# Patient Record
Sex: Male | Born: 1935 | Race: White | Hispanic: No | Smoking: Former smoker
Health system: Southern US, Community
[De-identification: ages and names within clinical notes are randomized; demographics above are authoritative.]

## PROBLEM LIST (undated history)

## (undated) ENCOUNTER — Emergency Department (HOSPITAL_COMMUNITY): Discharge status: Against Medical Advice | Payer: Medicare Other

## (undated) DIAGNOSIS — E785 Hyperlipidemia, unspecified: Secondary | ICD-10-CM

## (undated) DIAGNOSIS — C61 Malignant neoplasm of prostate: Secondary | ICD-10-CM

## (undated) DIAGNOSIS — N183 Chronic kidney disease, stage 3 unspecified: Secondary | ICD-10-CM

## (undated) DIAGNOSIS — G4733 Obstructive sleep apnea (adult) (pediatric): Secondary | ICD-10-CM

## (undated) DIAGNOSIS — N529 Male erectile dysfunction, unspecified: Secondary | ICD-10-CM

## (undated) DIAGNOSIS — Z9989 Dependence on other enabling machines and devices: Secondary | ICD-10-CM

## (undated) DIAGNOSIS — Z9889 Other specified postprocedural states: Secondary | ICD-10-CM

## (undated) DIAGNOSIS — F329 Major depressive disorder, single episode, unspecified: Secondary | ICD-10-CM

## (undated) DIAGNOSIS — K5909 Other constipation: Secondary | ICD-10-CM

## (undated) DIAGNOSIS — Z87442 Personal history of urinary calculi: Secondary | ICD-10-CM

## (undated) DIAGNOSIS — I951 Orthostatic hypotension: Secondary | ICD-10-CM

## (undated) DIAGNOSIS — I639 Cerebral infarction, unspecified: Secondary | ICD-10-CM

## (undated) DIAGNOSIS — I82409 Acute embolism and thrombosis of unspecified deep veins of unspecified lower extremity: Secondary | ICD-10-CM

## (undated) DIAGNOSIS — E039 Hypothyroidism, unspecified: Secondary | ICD-10-CM

## (undated) DIAGNOSIS — F03A0 Unspecified dementia, mild, without behavioral disturbance, psychotic disturbance, mood disturbance, and anxiety: Secondary | ICD-10-CM

## (undated) DIAGNOSIS — N400 Enlarged prostate without lower urinary tract symptoms: Secondary | ICD-10-CM

## (undated) DIAGNOSIS — D649 Anemia, unspecified: Secondary | ICD-10-CM

## (undated) DIAGNOSIS — Z8551 Personal history of malignant neoplasm of bladder: Secondary | ICD-10-CM

## (undated) DIAGNOSIS — E119 Type 2 diabetes mellitus without complications: Secondary | ICD-10-CM

## (undated) DIAGNOSIS — D494 Neoplasm of unspecified behavior of bladder: Secondary | ICD-10-CM

## (undated) DIAGNOSIS — R319 Hematuria, unspecified: Secondary | ICD-10-CM

## (undated) DIAGNOSIS — I2510 Atherosclerotic heart disease of native coronary artery without angina pectoris: Secondary | ICD-10-CM

## (undated) DIAGNOSIS — IMO0001 Reserved for inherently not codable concepts without codable children: Secondary | ICD-10-CM

## (undated) DIAGNOSIS — R195 Other fecal abnormalities: Secondary | ICD-10-CM

## (undated) DIAGNOSIS — F32A Depression, unspecified: Secondary | ICD-10-CM

## (undated) DIAGNOSIS — Z951 Presence of aortocoronary bypass graft: Secondary | ICD-10-CM

## (undated) DIAGNOSIS — K219 Gastro-esophageal reflux disease without esophagitis: Secondary | ICD-10-CM

## (undated) DIAGNOSIS — F419 Anxiety disorder, unspecified: Secondary | ICD-10-CM

## (undated) DIAGNOSIS — I219 Acute myocardial infarction, unspecified: Secondary | ICD-10-CM

## (undated) DIAGNOSIS — D49519 Neoplasm of unspecified behavior of unspecified kidney: Secondary | ICD-10-CM

## (undated) DIAGNOSIS — F0390 Unspecified dementia without behavioral disturbance: Secondary | ICD-10-CM

## (undated) DIAGNOSIS — Z8719 Personal history of other diseases of the digestive system: Secondary | ICD-10-CM

## (undated) DIAGNOSIS — I5042 Chronic combined systolic (congestive) and diastolic (congestive) heart failure: Secondary | ICD-10-CM

## (undated) HISTORY — PX: UPPER GASTROINTESTINAL ENDOSCOPY: SHX188

## (undated) HISTORY — DX: Hematuria, unspecified: R31.9

## (undated) HISTORY — PX: CARDIAC CATHETERIZATION: SHX172

## (undated) HISTORY — DX: Gastro-esophageal reflux disease without esophagitis: K21.9

## (undated) HISTORY — DX: Chronic combined systolic (congestive) and diastolic (congestive) heart failure: I50.42

## (undated) HISTORY — PX: EYE SURGERY: SHX253

## (undated) HISTORY — PX: OTHER SURGICAL HISTORY: SHX169

## (undated) HISTORY — DX: Neoplasm of unspecified behavior of bladder: D49.4

## (undated) HISTORY — PX: HAND SURGERY: SHX662

## (undated) HISTORY — PX: WRIST FRACTURE SURGERY: SHX121

## (undated) HISTORY — DX: Orthostatic hypotension: I95.1

## (undated) HISTORY — DX: Other constipation: K59.09

## (undated) HISTORY — DX: Hyperlipidemia, unspecified: E78.5

## (undated) HISTORY — PX: NEPHRECTOMY: SHX65

## (undated) HISTORY — PX: CARDIOVASCULAR STRESS TEST: SHX262

## (undated) HISTORY — DX: Anemia, unspecified: D64.9

## (undated) HISTORY — PX: HEMORRHOID SURGERY: SHX153

## (undated) HISTORY — DX: Other fecal abnormalities: R19.5

## (undated) HISTORY — DX: Obstructive sleep apnea (adult) (pediatric): G47.33

## (undated) HISTORY — DX: Dependence on other enabling machines and devices: Z99.89

## (undated) HISTORY — PX: BYPASS GRAFT: SHX909

---

## 2000-08-02 ENCOUNTER — Ambulatory Visit (HOSPITAL_COMMUNITY): Admission: RE | Admit: 2000-08-02 | Discharge: 2000-08-02 | Payer: Self-pay | Admitting: *Deleted

## 2000-08-02 ENCOUNTER — Encounter: Payer: Self-pay | Admitting: *Deleted

## 2000-12-25 ENCOUNTER — Ambulatory Visit (HOSPITAL_COMMUNITY): Admission: RE | Admit: 2000-12-25 | Discharge: 2000-12-25 | Payer: Self-pay | Admitting: Internal Medicine

## 2001-09-26 ENCOUNTER — Ambulatory Visit (HOSPITAL_COMMUNITY): Admission: RE | Admit: 2001-09-26 | Discharge: 2001-09-26 | Payer: Self-pay | Admitting: General Surgery

## 2002-07-28 ENCOUNTER — Emergency Department (HOSPITAL_COMMUNITY): Admission: EM | Admit: 2002-07-28 | Discharge: 2002-07-28 | Payer: Self-pay | Admitting: Emergency Medicine

## 2003-02-24 ENCOUNTER — Ambulatory Visit (HOSPITAL_COMMUNITY): Admission: RE | Admit: 2003-02-24 | Discharge: 2003-02-24 | Payer: Self-pay | Admitting: Family Medicine

## 2003-02-24 ENCOUNTER — Ambulatory Visit (HOSPITAL_COMMUNITY): Admission: RE | Admit: 2003-02-24 | Discharge: 2003-02-24 | Payer: Self-pay | Admitting: Internal Medicine

## 2003-04-24 ENCOUNTER — Ambulatory Visit (HOSPITAL_COMMUNITY): Admission: RE | Admit: 2003-04-24 | Discharge: 2003-04-24 | Payer: Self-pay | Admitting: General Surgery

## 2003-04-24 HISTORY — PX: UMBILICAL HERNIA REPAIR: SHX196

## 2003-10-29 ENCOUNTER — Ambulatory Visit (HOSPITAL_COMMUNITY): Admission: RE | Admit: 2003-10-29 | Discharge: 2003-10-29 | Payer: Self-pay | Admitting: Family Medicine

## 2003-11-10 ENCOUNTER — Ambulatory Visit (HOSPITAL_COMMUNITY): Admission: RE | Admit: 2003-11-10 | Discharge: 2003-11-10 | Payer: Self-pay | Admitting: Family Medicine

## 2004-05-18 ENCOUNTER — Ambulatory Visit (HOSPITAL_COMMUNITY): Admission: RE | Admit: 2004-05-18 | Discharge: 2004-05-18 | Payer: Self-pay | Admitting: Urology

## 2004-06-14 ENCOUNTER — Observation Stay (HOSPITAL_COMMUNITY): Admission: RE | Admit: 2004-06-14 | Discharge: 2004-06-15 | Payer: Self-pay | Admitting: Urology

## 2004-06-14 HISTORY — PX: TRANSURETHRAL RESECTION OF BLADDER TUMOR: SHX2575

## 2004-06-17 ENCOUNTER — Emergency Department (HOSPITAL_COMMUNITY): Admission: EM | Admit: 2004-06-17 | Discharge: 2004-06-17 | Payer: Self-pay | Admitting: *Deleted

## 2004-08-29 ENCOUNTER — Ambulatory Visit (HOSPITAL_COMMUNITY): Admission: RE | Admit: 2004-08-29 | Discharge: 2004-08-29 | Payer: Self-pay | Admitting: Family Medicine

## 2004-10-04 ENCOUNTER — Inpatient Hospital Stay (HOSPITAL_COMMUNITY): Admission: RE | Admit: 2004-10-04 | Discharge: 2004-10-07 | Payer: Self-pay | Admitting: Urology

## 2004-10-04 HISTORY — PX: TRANSURETHRAL RESECTION OF PROSTATE: SHX73

## 2005-12-16 ENCOUNTER — Emergency Department (HOSPITAL_COMMUNITY): Admission: EM | Admit: 2005-12-16 | Discharge: 2005-12-16 | Payer: Self-pay | Admitting: Emergency Medicine

## 2006-06-25 ENCOUNTER — Emergency Department (HOSPITAL_COMMUNITY): Admission: EM | Admit: 2006-06-25 | Discharge: 2006-06-25 | Payer: Self-pay | Admitting: Emergency Medicine

## 2006-07-26 ENCOUNTER — Ambulatory Visit (HOSPITAL_COMMUNITY): Admission: RE | Admit: 2006-07-26 | Discharge: 2006-07-26 | Payer: Self-pay | Admitting: Family Medicine

## 2006-08-13 ENCOUNTER — Ambulatory Visit: Payer: Self-pay | Admitting: Internal Medicine

## 2006-08-15 ENCOUNTER — Ambulatory Visit: Payer: Self-pay | Admitting: Internal Medicine

## 2006-08-15 ENCOUNTER — Ambulatory Visit (HOSPITAL_COMMUNITY): Admission: RE | Admit: 2006-08-15 | Discharge: 2006-08-15 | Payer: Self-pay | Admitting: Internal Medicine

## 2006-08-15 HISTORY — PX: COLONOSCOPY WITH ESOPHAGOGASTRODUODENOSCOPY (EGD): SHX5779

## 2006-10-16 ENCOUNTER — Encounter (INDEPENDENT_AMBULATORY_CARE_PROVIDER_SITE_OTHER): Payer: Self-pay | Admitting: Urology

## 2006-10-16 ENCOUNTER — Ambulatory Visit (HOSPITAL_COMMUNITY): Admission: RE | Admit: 2006-10-16 | Discharge: 2006-10-16 | Payer: Self-pay | Admitting: Urology

## 2006-10-16 HISTORY — PX: OTHER SURGICAL HISTORY: SHX169

## 2006-10-30 ENCOUNTER — Encounter (HOSPITAL_COMMUNITY): Admission: RE | Admit: 2006-10-30 | Discharge: 2006-11-29 | Payer: Self-pay | Admitting: Neurology

## 2006-11-30 ENCOUNTER — Encounter (HOSPITAL_COMMUNITY): Admission: RE | Admit: 2006-11-30 | Discharge: 2006-12-30 | Payer: Self-pay | Admitting: Neurology

## 2007-02-26 ENCOUNTER — Ambulatory Visit (HOSPITAL_COMMUNITY): Admission: RE | Admit: 2007-02-26 | Discharge: 2007-02-26 | Payer: Self-pay | Admitting: Family Medicine

## 2007-07-04 ENCOUNTER — Encounter (HOSPITAL_COMMUNITY): Admission: RE | Admit: 2007-07-04 | Discharge: 2007-08-03 | Payer: Self-pay | Admitting: Internal Medicine

## 2007-07-17 ENCOUNTER — Ambulatory Visit (HOSPITAL_COMMUNITY): Admission: RE | Admit: 2007-07-17 | Discharge: 2007-07-17 | Payer: Self-pay | Admitting: Urology

## 2007-08-03 ENCOUNTER — Emergency Department (HOSPITAL_COMMUNITY): Admission: EM | Admit: 2007-08-03 | Discharge: 2007-08-03 | Payer: Self-pay | Admitting: Emergency Medicine

## 2007-08-21 ENCOUNTER — Encounter (HOSPITAL_COMMUNITY): Admission: RE | Admit: 2007-08-21 | Discharge: 2007-09-20 | Payer: Self-pay | Admitting: Internal Medicine

## 2008-05-14 ENCOUNTER — Emergency Department (HOSPITAL_COMMUNITY): Admission: EM | Admit: 2008-05-14 | Discharge: 2008-05-14 | Payer: Self-pay | Admitting: Emergency Medicine

## 2009-07-11 ENCOUNTER — Ambulatory Visit: Admission: RE | Admit: 2009-07-11 | Discharge: 2009-07-11 | Payer: Self-pay | Admitting: Neurology

## 2009-08-05 ENCOUNTER — Ambulatory Visit (HOSPITAL_COMMUNITY): Admission: RE | Admit: 2009-08-05 | Discharge: 2009-08-05 | Payer: Self-pay | Admitting: Internal Medicine

## 2009-09-20 ENCOUNTER — Emergency Department (HOSPITAL_COMMUNITY): Admission: EM | Admit: 2009-09-20 | Discharge: 2009-09-20 | Payer: Self-pay | Admitting: Emergency Medicine

## 2009-10-18 ENCOUNTER — Ambulatory Visit (HOSPITAL_COMMUNITY): Admission: RE | Admit: 2009-10-18 | Discharge: 2009-10-18 | Payer: Self-pay | Admitting: Family Medicine

## 2010-01-25 ENCOUNTER — Ambulatory Visit: Payer: Self-pay | Admitting: Internal Medicine

## 2010-02-28 ENCOUNTER — Ambulatory Visit: Payer: Self-pay | Admitting: Internal Medicine

## 2010-03-01 LAB — HEPATIC FUNCTION PANEL
ALT: 10 U/L (ref 10–40)
AST: 15 U/L (ref 14–40)
Bilirubin, Total: 0.5 mg/dL

## 2010-03-01 LAB — BASIC METABOLIC PANEL: Glucose: 108 mg/dL

## 2010-03-01 LAB — CBC AND DIFFERENTIAL: Hemoglobin: 14.5 g/dL (ref 13.5–17.5)

## 2010-03-04 ENCOUNTER — Ambulatory Visit (HOSPITAL_COMMUNITY)
Admission: RE | Admit: 2010-03-04 | Discharge: 2010-03-04 | Payer: Self-pay | Source: Home / Self Care | Admitting: Internal Medicine

## 2010-04-06 ENCOUNTER — Ambulatory Visit: Payer: Self-pay | Admitting: Internal Medicine

## 2010-04-06 ENCOUNTER — Ambulatory Visit (HOSPITAL_COMMUNITY)
Admission: RE | Admit: 2010-04-06 | Discharge: 2010-04-06 | Payer: Self-pay | Source: Home / Self Care | Attending: Internal Medicine | Admitting: Internal Medicine

## 2010-05-01 ENCOUNTER — Encounter: Payer: Self-pay | Admitting: Family Medicine

## 2010-06-27 LAB — RAPID URINE DRUG SCREEN, HOSP PERFORMED
Barbiturates: NOT DETECTED
Cocaine: NOT DETECTED
Opiates: NOT DETECTED
Tetrahydrocannabinol: NOT DETECTED

## 2010-06-27 LAB — COMPREHENSIVE METABOLIC PANEL
Alkaline Phosphatase: 59 U/L (ref 39–117)
BUN: 15 mg/dL (ref 6–23)
CO2: 27 mEq/L (ref 19–32)
GFR calc non Af Amer: 60 mL/min — ABNORMAL LOW (ref >60)
Glucose, Bld: 108 mg/dL — ABNORMAL HIGH (ref 70–99)
Potassium: 4.2 mEq/L (ref 3.5–5.1)
Sodium: 137 mEq/L (ref 135–145)

## 2010-06-27 LAB — URINALYSIS, ROUTINE W REFLEX MICROSCOPIC
Bilirubin Urine: NEGATIVE
Ketones, ur: NEGATIVE mg/dL
Nitrite: NEGATIVE
pH: 5 (ref 5.0–8.0)

## 2010-06-27 LAB — DIFFERENTIAL
Basophils Relative: 1 % (ref 0–1)
Lymphocytes Relative: 24 % (ref 12–46)
Lymphs Abs: 1.7 10*3/uL (ref 0.7–4.0)
Monocytes Relative: 10 % (ref 3–12)
Neutro Abs: 4.3 10*3/uL (ref 1.7–7.7)

## 2010-06-27 LAB — CBC
HCT: 39.6 % (ref 39.0–52.0)
Hemoglobin: 13.5 g/dL (ref 13.0–17.0)
MCV: 90.5 fL (ref 78.0–100.0)
Platelets: 185 10*3/uL (ref 150–400)
RBC: 4.37 MIL/uL (ref 4.22–5.81)
RDW: 13.5 % (ref 11.5–15.5)

## 2010-06-27 LAB — URINE MICROSCOPIC-ADD ON

## 2010-06-27 LAB — URINE CULTURE: Colony Count: NO GROWTH

## 2010-07-26 LAB — URINALYSIS, ROUTINE W REFLEX MICROSCOPIC
Bilirubin Urine: NEGATIVE
Hgb urine dipstick: NEGATIVE
Ketones, ur: NEGATIVE mg/dL
Nitrite: NEGATIVE
Specific Gravity, Urine: 1.015 (ref 1.005–1.030)
pH: 6 (ref 5.0–8.0)

## 2010-07-26 LAB — CBC
HCT: 39.2 % (ref 39.0–52.0)
Hemoglobin: 13.3 g/dL (ref 13.0–17.0)
MCHC: 34 g/dL (ref 30.0–36.0)
MCV: 90.5 fL (ref 78.0–100.0)
Platelets: 194 10*3/uL (ref 150–400)
RDW: 13.6 % (ref 11.5–15.5)

## 2010-07-26 LAB — BASIC METABOLIC PANEL
BUN: 22 mg/dL (ref 6–23)
CO2: 27 mEq/L (ref 19–32)
Chloride: 101 mEq/L (ref 96–112)
GFR calc non Af Amer: 52 mL/min — ABNORMAL LOW (ref >60)
Glucose, Bld: 95 mg/dL (ref 70–99)
Potassium: 4.2 mEq/L (ref 3.5–5.1)
Sodium: 136 mEq/L (ref 135–145)

## 2010-07-26 LAB — DIFFERENTIAL
Basophils Absolute: 0 10*3/uL (ref 0.0–0.1)
Eosinophils Absolute: 0.2 10*3/uL (ref 0.0–0.7)
Eosinophils Relative: 3 % (ref 0–5)
Lymphocytes Relative: 18 % (ref 12–46)
Monocytes Absolute: 0.7 10*3/uL (ref 0.1–1.0)

## 2010-08-23 NOTE — Op Note (Signed)
NAME:  Grunewald, Damean                 ACCOUNT NO.:  [id number]   MEDICAL RECORD NO.:  [id number]          PATIENT TYPE:  AMB   LOCATION:  DAY                           FACILITY:  [facility]   PHYSICIAN:  Ky Barban, M.D.DATE OF BIRTH:  [birth year]   DATE OF PROCEDURE:  10/16/2006  DATE OF DISCHARGE:                               OPERATIVE REPORT   PREOPERATIVE DIAGNOSIS:  Bladder tumor.   POSTOPERATIVE DIAGNOSIS:  Small bladder tumor, small bladder stone.   PROCEDURE:  Biopsy of the bladder tumor, TUR bladder tumor and  fulguration and removal of the bladder stone.   ANESTHESIA:  Spinal.   PROCEDURE:  The patient under spinal anesthesia in lithotomy position,  after usual prep and drape #25 cystoscope introduced into the bladder.  It was thoroughly inspected.  There were a couple of separate foci of  papillary growth on the left bladder wall, one was on the left bladder  wall.  The second focus was on the posterior bladder wall behind the  trigone.  Using a flexible biopsy forceps, the left bladder wall tumor  was biopsied.  Then the cystoscope was changed and I inserted a #28  Iglesias resectoscope.  The tumor on the left bladder wall was simply  fulgurated.  The tumor and the left bladder wall was resected completely  and the surrounding mucosa was fulgurated.  The tumor located on the  posterior bladder was simply fulgurated.  The specimen was removed with  the help of Ellik evacuator.  There was a small bladder stone floating  around in the bladder.  It was removed with the help of a flexible  biopsy forceps.  The bladder was thoroughly inspected.  There was no  other tumor focus and the resectoscope was removed.  The #20 Foley  catheter left in for drainage.  The patient left the operating room in  satisfactory condition.      Ky Barban, M.D.  Electronically Signed     MIJ/MEDQ  D:  10/16/2006  T:  10/16/2006  Job:  [id number]

## 2010-08-23 NOTE — H&P (Signed)
NAME:  [name] [name], [name] [name]                 ACCOUNT NO.:  [id number]   MEDICAL RECORD NO.:  [id number]          PATIENT TYPE:  AMB   LOCATION:                                FACILITY:  [facility]   PHYSICIAN:  Ky Barban, M.D.DATE OF BIRTH:  1933-06-02   DATE OF ADMISSION:  DATE OF DISCHARGE:  LH                              HISTORY & PHYSICAL   CHIEF COMPLAINT:  Recurrent bladder tumor.   HISTORY OF PRESENT ILLNESS:  The patient is an 77 year old gentleman  known to have superficial transitional-cell carcinoma of the bladder and  on routine cystoscopy was found to have recurrent probably growth. He is  coming as outpatient to undergo TUR for bladder tumor. It is possible I  will keep him for observation overnight.   PAST MEDICAL HISTORY:  A couple of years ago he had a TUR of prostate  for BPH and he was also found to have a suspicious, low-grade,  adenocarcinoma in the prostate. No history of any other medical problem.   __________  unremarkable.   SOCIAL HISTORY:  He does not smoke or drink.   REVIEW OF SYSTEMS:  Unremarkable.   PHYSICAL EXAMINATION:  GENERAL APPEARANCE: On examination, well-  developed, well-nourished male. Not in any acute distress. Conscious,  alert, oriented.  VITAL SIGNS: Blood pressure 120/80, temperature is normal.  CENTRAL NERVOUS SYSTEM: Negative.  HEENT: Negative.  CHEST: Symmetrical.  HEART: Regular sinus rhythm. No murmur.  ABDOMEN: Soft, flat. Liver, spleen, kidneys nonpalpable. No suprapubic  tenderness.  GU: External genitalia: Circumcised, meatus adequate. Testicles are  normal.  RECTAL: Exam showed normal sphincter tone, no rectal mass. Prostate  1.5+, smooth and firm.   IMPRESSION:  Well-differentiated, low-grade adenocarcinoma of the  prostate, recurrent bladder tumor.   PLAN:  __________  of bladder tumor under anesthesia. Then keep him for  observation in the hospital.      Ky Barban, M.D.  Electronically Signed     MIJ/MEDQ  D:  10/15/2006  T:  10/15/2006  Job:  [id number]

## 2010-08-26 NOTE — H&P (Signed)
NAME:  [name] [name], [name] [name]                 ACCOUNT NO.:  [id number]   MEDICAL RECORD NO.:  [id number]          PATIENT TYPE:  AMB   LOCATION:  DAY                           FACILITY:  [facility]   PHYSICIAN:  Ky Barban, M.D.DATE OF BIRTH:  Sep 26, 1933   DATE OF ADMISSION:  DATE OF DISCHARGE:  LH                                HISTORY & PHYSICAL   CHIEF COMPLAINT:  Bladder tumor.   HISTORY:  A 77 year old gentleman is well known to me.  He is being followed  since August of last year with gross total painless hematuria and the  initial workup with cystoscopy, urine cytologies were negative.  PSA was  also normal and cystoscopy did show that he has an enlarged prostate, so my  feeling was that he probably bled from his prostate, put him on Uroxatral  and he did well.  He has been followed since then.  When he complained that  he was having symptoms of prostatism, we put him on Uroxatral and he has  good results with that.  Symptoms improved, but he continued to show  microscopic hematuria and then last month, I repeated his CT with and  without contrast along with urine cytologies and I did a cystoscopy again.  He is found to have a large papillary growth located on his left bladder  wall.  So he has a bladder tumor.  I have advised him to undergo TUR of  bladder tumor for which he is coming as an outpatient and it will be done.  Keep him overnight in the hospital.  Procedures, complications, especially  bladder perforation leading to open surgery discussed in detail.      MIJ/MEDQ  D:  06/13/2004  T:  06/13/2004  Job:  [id number]

## 2010-08-26 NOTE — H&P (Signed)
NAME:  [name] [name], [name] [name]                           ACCOUNT NO.:  [id number]   MEDICAL RECORD NO.:  [id number]                   PATIENT TYPE:  AMB   LOCATION:  DAY                                  FACILITY:  [facility]   PHYSICIAN:  Dalia Heading, M.D.               DATE OF BIRTH:  11-Jul-1933   DATE OF ADMISSION:  DATE OF DISCHARGE:                                HISTORY & PHYSICAL   CHIEF COMPLAINT:  Umbilical hernia.   HISTORY OF PRESENT ILLNESS:  The patient is a 77 year old white male who is  referred for evaluation and treatment of an umbilical hernia.  He has had it  for many years, but recently it has increased in size and is causing him  discomfort.  No nausea or vomiting have been noted.   PAST MEDICAL HISTORY:  Includes hypothyroidism and hypercholesterolemia.   PAST SURGICAL HISTORY:  1. Hemorrhoidectomy.  2. Wrist surgery.  3. EGD.  4. Colonoscopy.   CURRENT MEDICATIONS:  Synthroid and Lipitor.   ALLERGIES:  No known drug allergies.   REVIEW OF SYSTEMS:  Noncontributory.   PHYSICAL EXAMINATION:  GENERAL:  On physical examination, the patient is a  well-developed well-nourished white male in no acute distress.  VITAL SIGNS:  He is afebrile and vital signs are stable.  LUNGS:  Clear to auscultation with equal breath sounds bilaterally.  HEART:  Heart examination reveals a regular rate and rhythm without S3, S4,  or murmurs.  ABDOMEN:  The abdomen is soft, nontender, nondistended.  No  hepatosplenomegaly or masses are noted.  There is a moderate-side, minimally  reducible, umbilical hernia present.   IMPRESSION:  Umbilical hernia.   PLAN:  The patient is scheduled for an umbilical herniorrhaphy on April 24, 2003.  The risks and benefits of the procedure including bleeding,  infection, and recurrence of the hernia were fully explained to the patient,  who gave informed consent.     ___________________________________________   Dalia Heading, M.D.   MAJ/MEDQ  D:  04/21/2003  T:  04/21/2003  Job:  [id number]   cc:   Corrie Mckusick, M.D.  [address] [place] [place]  [place]  [place] [address]  Fax: [phone]

## 2010-08-26 NOTE — H&P (Signed)
NAME:  [name] [name]                 ACCOUNT NO.:  [id number]   MEDICAL RECORD NO.:  [id number]          PATIENT TYPE:  AMB   LOCATION:  DAY                           FACILITY:  [facility]   PHYSICIAN:  [name] [name], M.D.    DATE OF BIRTH:  06-23-1933   DATE OF ADMISSION:  DATE OF DISCHARGE:  LH                              HISTORY & PHYSICAL   CHIEF COMPLAINT:  Worsening dysphagia.   HISTORY OF PRESENT ILLNESS:  [name] [name] is a 77 year old Caucasian male  who was referred back to [place] through the courtesy of [name] [name].  He has  a history of chronic GERD.  He tells me he has had dysphagia, which is  worsening.  He feels as though food gets stuck in the upper esophagus.  He is having problems with both liquids and solids.  He feels like when  he swallows cold liquids it locks his throat.  His weight has remained  stable.  He does have heartburn and indigestion.  He has been on b.i.d.  Prevacid for the last month per his report.  Prior to this he was on  Prilosec.  He does complain of nausea.  It is usually worse first thing  in a.m. He has had nausea for at least 6 months, if not a year.  He  denies any NSAID use or aspirin use.  He has had episodes of dizziness  especially with movement.  Denies any weakness or paresthesias, denies  any chest pain.  Denies any rectal bleeding or melena.  He is having a  bowel movement about every other day.   PAST MEDICAL HISTORY:  1. Prostate surgery.  2. Benign bladder tumor removed.  He is followed by [name] [name].  3. Herniorrhaphy.  4. He has history of intermittent dysphagia.  That could be due to      esophageal motility disorder.  His last EGD was by [name] [name] on      December 25, 2005.  It was normal other than a torturous body of      the esophagus.  It was dilated with a 75F Maloney dilator      empirically.  He had a barium pill esophagram on February 24, 2003,      which showed marked distal esophageal dysmotility.   CURRENT MEDICATIONS:  1. Lexapro 10 mg daily.  2. Promethazine 25 mg p.r.n.  3. Prevacid 30 mg b.i.d.   ALLERGIES:  NO KNOWN DRUG ALLERGIES.   SOCIAL HISTORY:  [name] [name] is a widower.  He has three grown healthy  sons.  He is retired.  He has remote history of tobacco use.  Denies any  alcohol or drug use.   REVIEW OF SYSTEMS:  CONSTITUTIONAL:  He reports his weight has been  stable although he has lost almost 10 pounds since we saw him 3 years  ago.  He denies any fatigue.  CARDIOVASCULAR:  No chest pain or  palpitations.  PULMONARY:  No shortness of breath, dyspnea, cough,  hemoptysis.  GI: See HPI.   PHYSICAL EXAMINATION:  VITAL SIGNS:  Weight  218 lb, height 71 inches,  temperature 92, blood pressure 100/78, pulse 64.  GENERAL:  [name] [name] is a elderly Caucasian male who is alert and  pleasant, cooperative, in no acute distress.  HEENT: Pupils equal and clear, nonicteric.  Conjunctivae pink.  Oropharynx pink and moist without lesions.  NECK:  Supple without mass or thyromegaly.  CHEST:  Heart regular rhythm without any murmurs, clicks, rubs or  gallops.  LUNGS:  Clear to auscultation bilaterally.  ABDOMEN:  Positive bowel sounds x4.  No bruits auscultated.  He does  have a easily reducible small umbilical hernia, which is nontender.  ABDOMEN:  Soft without any hepatosplenomegaly or mass.  No rebound  tenderness or guarding.  EXTREMITIES:  Without clubbing or edema bilaterally.  SKIN:  Pink, warm, dry without rash or jaundice.   LABORATORY STUDIES:  From July 17, 2006.  He had a normal CBC and normal  CMP, except for a glucose of 101, normal LFTs and normal thyroid panel  and PSA .   IMPRESSION:  [name] [name] is a 77 year old Caucasian male with worsening  dysphagia and chronic nausea over the last 6 months to 1 year.  He has a  history of esophageal motility disorder, which could be contributing to  his symptoms of dysphagia; however, given his history of chronic nausea  would consider further  evaluation at this point to rule out peptic ulcer  disease as well as complicated gastroesophageal reflux disease including  the development of __________  stricture.  It is possible that his  nausea could be central in origin.   PLAN:  1. He is going to need EGD with possible ED by [name] [name] in the near      future.  Discuss the procedure including risks and benefits      including but not limited to infection, perforation, drug reaction      and agreed. A signed consent will be obtained.  2. He is due for screening colonoscopy which could be done at the same      time.  3. He is to continue Prevacid 30 mg b.i.d. for now.   Put this up for my signature as well as [name] [name] signature.      [name] [name], N.P.      [name] [name], M.D.     KC/MEDQ  D:  08/14/2006  T:  08/14/2006  Job:  [id number]

## 2010-08-26 NOTE — H&P (Signed)
NAME:  [name] [name], [name] [name]                 ACCOUNT NO.:  [id number]   MEDICAL RECORD NO.:  [id number]           PATIENT TYPE:   LOCATION:                                 FACILITY:   PHYSICIAN:  Ky Barban, M.D.    DATE OF BIRTH:   DATE OF ADMISSION:  DATE OF DISCHARGE:  LH                                HISTORY & PHYSICAL   CHIEF COMPLAINT:  BPH, symptoms of prostatism.   HISTORY OF PRESENT ILLNESS:  This 77 year old gentleman has been followed by  me for several years symptoms of prostatism.  He was treated medically with  UroXatral with good results, but lately he is having more trouble voiding  and past workup and cystoscopy had showed that he has an enlarged prostate  with a bladder neck obstruction.  It should be also noted that earlier this  year, he had an IVP that showed evidence gross hematuria.  He was found to  have a bladder tumor which was resected and biopsied.  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  He has  significant enlarged prostate, is very symptomatic, so I have advised him  that he can go ahead and have a transurethral resection of the prostate.  I  discussed the procedure and limitations, complications with him.  He  understands and wanted to proceed.  It should be noted that sometimes his  symptom score is only 1 and sometimes he is very symptomatic.  Visually  looking at his prostate, he has complete obstruction and I was just going by  his symptoms, treating him with medications, and he did not respond, so I  think he needs TUR of prostate.  He is coming as an outpatient and will  undergo that.   PAST MEDICAL HISTORY:  1.  Bladder tumor, low grade transitional cell.  2.  No history of diabetes or hypertension.   MEDICATIONS:  Synthroid, Aciphex, Clarinex, hemorrhoidectomy, umbilical  hernia repair, fracture of right arm.   FAMILY  HISTORY/PERSONAL HISTORY:  Does not smoke or drink.  No family  history of diabetes, hypertension or prostate cancer.   REVIEW OF SYSTEMS:  Unremarkable.   PHYSICAL EXAMINATION:  VITAL SIGNS:  Blood pressure 130/80, temperature  normal.  CENTRAL NERVOUS SYSTEM:  No gross neurological deficit.  HEAD AND NECK:  Negative.  CHEST:  Symmetrical.  HEART:  Regular sinus rhythm.  ABDOMEN:  Soft, flat.  Liver, spleen, and kidneys not palpable.  No CVA  tenderness.  EXTERNAL GENITALIA:  Circumcised, meatus adequate.  Testicles are normal.  RECTAL:  Prostate 2+, smooth and firm.  No rectal mass.   IMPRESSION:  1.  Benign prostatic hypertrophy with bladder neck obstruction.  2.  Bladder tumor.   PLAN:  Transurethral resection of the prostate under anesthesia, then admit  him to the hospital.       MIJ/MEDQ  D:  10/03/2004  T:  10/03/2004  Job:  [id number]

## 2010-08-26 NOTE — Op Note (Signed)
NAME:  [name] [name], [name] [name]                 ACCOUNT NO.:  [id number]   MEDICAL RECORD NO.:  [id number]          PATIENT TYPE:  AMB   LOCATION:  DAY                           FACILITY:  [facility]   PHYSICIAN:  Lionel December, M.D.    DATE OF BIRTH:  1933/04/28   DATE OF PROCEDURE:  08/15/2006  DATE OF DISCHARGE:                               OPERATIVE REPORT   PROCEDURE:  Esophagogastroduodenoscopy with esophageal dilation followed  by colonoscopy.   INDICATIONS:  [name] [name] is a 77 year old Caucasian male with chronic GERD,  heartburn is not well-controlled with double dose PPI, who also has  dysphagia.  His dysphagia is suspected to be due to motility disorder.  He did notice some relief with esophageal dilation in September 2007.  He also complains of recurrent nausea.  He is undergoing  diagnostic/therapeutic EGD.  He is also undergoing average risk  screening colonoscopy.  Procedure and risks were reviewed with the  patient and informed consent was obtained.   MEDICATIONS FOR CONSCIOUS SEDATION:  Benzocaine spray for pharyngeal  topical anesthesia, Demerol 50 mg IV, Versed 4 mg IV.   FINDINGS:  Procedure performed in endoscopy suite.  The patient's vital  signs and O2 sats were monitored during the procedure and remained  stable.   PROCEDURES:  #1.  Esophagogastroduodenoscopy.  The patient was placed  left lateral recumbent position and the Pentax videoscope was passed  oropharynx without any difficulty into esophagus.   Esophagus:  Mucosa of the esophagus was normal throughout.  GE junction  at 40 cm from the incisors and was unremarkable.   Stomach:  It was empty and distended very well with insufflation.  Folds  of the proximal stomach were normal.  Examination of the mucosa at body,  antrum, pyloric channel was normal.  Angularis, fundus and cardia were  examined by retroflexing the scope and were normal except for focal  erythema below the cardia, possibly equivalent of a Mallory-Weiss  tear.   Duodenum:  Bulbar mucosa was normal.  Scope was passed in the second  part of the duodenum where mucosa and folds were normal.  Endoscope was  withdrawn.   Esophagus was dilated by passing 56 and 58-French Maloney dilators to  full insertion.  Esophageal mucosa was reexamined post dilation and no  mucosal disruption noted.  Endoscope was withdrawn.  The patient  prepared for procedure #2.   #2.  Colonoscopy:  Rectal examination performed.  No abnormality noted  on external or digital exam.  Pentax videoscope was placed in the rectum  and advanced under vision into sigmoid colon and beyond.  Preparation  was satisfactory.  Few scattered diverticula were noted primarily at the  sigmoid and descending colon.  Most of these were very small.  Scope was  passed to the cecum which was identified by very prominent lipomatous  ileocecal valve.  Blunt end of the cecum was also well seen and was  normal.  Picture was taken of the appendiceal orifice after the blunt  end of cecum was normal.  As the scope was withdrawn, colonic mucosa was  carefully  examined and was normal.  Rectal mucosa similarly was normal.  Scope was retroflexed to examine anorectal junction and small  hemorrhoids were noted below the dentate line.  Endoscope was  straightened and withdrawn.  The patient tolerated the procedures well.   FINAL DIAGNOSES:  1. Normal esophagogastroduodenoscopy except focal erythema at gastric      fundus.  2. Esophagus dilated by passing 56 and 58-French Maloney dilator to      full insertion but without mucosal disruption.  3. Suspect dysphagia primarily due to motility disorder.  If he      remains with significant problems, would consider esophageal      manometry.  4. If he has diffuse esophageal spasm, he may benefit from calcium      channel blocker.  5. Few small scattered diverticula at sigmoid colon and transverse      colon.  6. Small external hemorrhoids.    RECOMMENDATIONS:  1. He will continue antireflux measures and Prevacid 30 mg b.i.d.  2. The patient instructed to avoid dry foods that seemed to cause his      dysphagia.  3. He will call us with a progress support in one week.      Lionel December, M.D.  Electronically Signed     NR/MEDQ  D:  08/15/2006  T:  08/15/2006  Job:  [id number]   cc:   Corrie Mckusick, M.D.  Fax: [phone]

## 2010-08-26 NOTE — H&P (Signed)
NAME:  [name], [name]                 ACCOUNT NO.:  [id number]   MEDICAL RECORD NO.:  [id number]          PATIENT TYPE:  AMB   LOCATION:  DAY                           FACILITY:  [facility]   PHYSICIAN:  Lionel December, M.D.    DATE OF BIRTH:  27-Mar-1934   DATE OF ADMISSION:  DATE OF DISCHARGE:  LH                              HISTORY & PHYSICAL   CHIEF COMPLAINT:  Worsening dysphagia.   HISTORY OF PRESENT ILLNESS:  [name] [name] is a 77 year old Caucasian male  who was referred back to [place] through the courtesy of [name] [name].  He has  a history of chronic GERD.  He tells me he has had dysphagia, which is  worsening.  He feels as though food gets stuck in the upper esophagus.  He is having problems with both liquids and solids.  He feels like when  he swallows cold liquids it locks his throat.  His weight has remained  stable.  He does have heartburn and indigestion.  He has been on b.i.d.  Prevacid for the last month per his report.  Prior to this he was on  Prilosec.  He does complain of nausea.  It is usually worse first thing  in a.m. He has had nausea for at least 6 months, if not a year.  He  denies any NSAID use or aspirin use.  He has had episodes of dizziness  especially with movement.  Denies any weakness or paresthesias, denies  any chest pain.  Denies any rectal bleeding or melena.  He is having a  bowel movement about every other day.   PAST MEDICAL HISTORY:  1. Prostate surgery.  2. Benign bladder tumor removed.  He is followed by [name] [name].  3. Herniorrhaphy.  4. He has history of intermittent dysphagia.  That could be due to      esophageal motility disorder.  His last EGD was by [name] [name] on      December 25, 2005.  It was normal other than a torturous body of      the esophagus.  It was dilated with a 57F Maloney dilator      empirically.  He had a barium pill esophagram on February 24, 2003,      which showed marked distal esophageal dysmotility.   CURRENT MEDICATIONS:  1. Lexapro 10 mg daily.  2. Promethazine 25 mg p.r.n.  3. Prevacid 30 mg b.i.d.   ALLERGIES:  NO KNOWN DRUG ALLERGIES.   SOCIAL HISTORY:  [name] [name] is a widower.  He has three grown healthy  sons.  He is retired.  He has remote history of tobacco use.  Denies any  alcohol or drug use.   REVIEW OF SYSTEMS:  CONSTITUTIONAL:  He reports his weight has been  stable although he has lost almost 10 pounds since we saw him 3 years  ago.  He denies any fatigue.  CARDIOVASCULAR:  No chest pain or  palpitations.  PULMONARY:  No shortness of breath, dyspnea, cough,  hemoptysis.  GI: See HPI.   PHYSICAL EXAMINATION:  VITAL SIGNS:  Weight  218 lb, height 71 inches,  temperature 92, blood pressure 100/78, pulse 64.  GENERAL:  [name] [name] is a elderly Caucasian male who is alert and  pleasant, cooperative, in no acute distress.  HEENT: Pupils equal and clear, nonicteric.  Conjunctivae pink.  Oropharynx pink and moist without lesions.  NECK:  Supple without mass or thyromegaly.  CHEST:  Heart regular rhythm without any murmurs, clicks, rubs or  gallops.  LUNGS:  Clear to auscultation bilaterally.  ABDOMEN:  Positive bowel sounds x4.  No bruits auscultated.  He does  have a easily reducible small umbilical hernia, which is nontender.  ABDOMEN:  Soft without any hepatosplenomegaly or mass.  No rebound  tenderness or guarding.  EXTREMITIES:  Without clubbing or edema bilaterally.  SKIN:  Pink, warm, dry without rash or jaundice.   LABORATORY STUDIES:  From July 17, 2006.  He had a normal CBC and normal  CMP, except for a glucose of 101, normal LFTs and normal thyroid panel  and PSA .   IMPRESSION:  [name] [name] is a 77 year old Caucasian male with worsening  dysphagia and chronic nausea over the last 6 months to 1 year.  He has a  history of esophageal motility disorder, which could be contributing to  his symptoms of dysphagia; however, given his history of chronic nausea  would consider further  evaluation at this point to rule out peptic ulcer  disease as well as complicated gastroesophageal reflux disease including  the development of esophageal stricture.  It is possible that his nausea  could be central in origin.   PLAN:  1. He is going to need EGD with possible ED by [name] [name] in the near      future.  Discuss the procedure including risks and benefits      including but not limited to infection, perforation, drug reaction      and agreed. A signed consent will be obtained.  2. He is due for screening colonoscopy which could be done at the same      time.  3. He is to continue Prevacid 30 mg b.i.d. for now.   Put this up for my signature as well as [name] [name] signature.      [name] [name], N.P.      Lionel December, M.D.  Electronically Signed    KC/MEDQ  D:  08/14/2006  T:  08/14/2006  Job:  [id number]

## 2010-08-26 NOTE — Discharge Summary (Signed)
NAME:  [name] [name], [name] [name]                 ACCOUNT NO.:  [id number]   MEDICAL RECORD NO.:  [id number]          PATIENT TYPE:  INP   LOCATION:  A307                          FACILITY:  [facility]   PHYSICIAN:  Ky Barban, M.D.DATE OF BIRTH:  1934-01-29   DATE OF ADMISSION:  10/04/2004  DATE OF DISCHARGE:  06/30/2006LH                                 DISCHARGE SUMMARY   HISTORY OF PRESENT ILLNESS:  This is 77 year old male with a long standing  history of prostatism, treated medically with Uroxatral and now he is still  having symptoms.  Workup has shown that he has [facility] with bladder neck  obstruction.  I advised him to undergo TUR prostate.  It should be also  mentioned that he has low grade transitional cell carcinoma which is well  under control with resection.  His other medications include Synthroid,  Aciphex, Clarinex.   PAST SURGICAL HISTORY:  1.  Hemorrhoidectomy  2.  __________ repair.  3.  Fracture of his right arm.   HOSPITAL COURSE:  He was admitted.  CBC, urinalysis, ASTRA-7, EKG abnormal.  He was taken to the operating room on October 04, 2004 where a TUR prostate is  done.  Postoperatively he is fine.  Postop day #1 urine is clear.  CVA is  discontinued.  He is out of bed.  Placed him on regular diet.  He was  complaining of some discomfort in his leg which was treated symptomatically.  He was placed on a regular diet, out of bed.  CVA was discontinued.  The  next day, he was doing well, urine was clear, having bladder spasms.  __________ from his balloon and started on day #12 ILA.  On June 29, urine  is clear.  Foley catheter is discontinued.  The next day, he is voiding fine  and being discharged home.  Final pathology report is back which shows  microscopic foci of carotid gland via immunostain and positive for  micronuclear light.  Suspicious for low grade adenocarcinoma.  He has a well  differentiated carcinoma in the prostate.   FINAL DISCHARGE DIAGNOSES:  1.  Well  differentiated prostate cancer.  2.  Bladder cancer.   FOLLOWUP:  Will see him back in the office in two weeks.  Discussed above  further management.  I do not think he needs to be treated with anything  else.  We will follow his PSA.  To report to the office in two weeks.       MIJ/MEDQ  D:  11/16/2004  T:  11/17/2004  Job:  [id number]

## 2010-08-26 NOTE — Op Note (Signed)
NAME:  [name] [name], [name] [name]                           ACCOUNT NO.:  [id number]   MEDICAL RECORD NO.:  [id number]                   PATIENT TYPE:  AMB   LOCATION:  DAY                                  FACILITY:  [facility]   PHYSICIAN:  Dalia Heading, M.D.               DATE OF BIRTH:  09/11/1933   DATE OF PROCEDURE:  04/24/2003  DATE OF DISCHARGE:                                 OPERATIVE REPORT   PROCEDURE:  Umbilical hernia, incarcerated.   POSTOPERATIVE DIAGNOSIS:  Umbilical hernia, incarcerated.   PROCEDURE:  Umbilical herniorrhaphy, incarcerated.   SURGEON:  Dalia Heading, M.D.   ANESTHESIA:  General.   INDICATIONS FOR PROCEDURE:  The patient is a 77 year old white male with an  umbilical hernia which is not able to be fully reduced.  The patient now  comes to the operating room for an umbilical herniorrhaphy.  The risks and  benefits of the procedure, including bleeding, infection, and recurrence of  the hernia were fully explained to the patient who gave informed consent.   DESCRIPTION OF PROCEDURE:  The patient was placed in the supine position.  After general anesthesia was administered, the abdomen was prepped and  draped using the usual sterile technique with Betadine.  Surgical site  confirmation was performed.   An infraumbilical incision was made down to the fascia.  Omentum was noted  to be within the hernia sac.  This was incarcerated and had to be opened and  some omentum resected.  The remaining omentum was then reduced.  The hernia  sac was excised.  A defect of approximately 3 to 4 cm was noted.  This was  reapproximated transversely using 0 Surgidac interrupted sutures.  The base  of the umbilicus was secured back to the fascia using a 2-0 Vicryl  interrupted suture.  The subcutaneous layer was reapproximated using a 3-0  Vicryl interrupted suture.  The skin was closed using staples.  Sensorcaine  0.5% was instilled into the surrounding wound.  Betadine ointment and  a dry  sterile dressing were applied.   All tape and needle counts were correct at the end of the procedure.  The  patient was awakened and transferred to the PACU in stable condition.   COMPLICATIONS:  None.   SPECIMENS:  None.   ESTIMATED BLOOD LOSS:  Minimal.      ___________________________________________                                            Dalia Heading, M.D.   MAJ/MEDQ  D:  04/24/2003  T:  04/24/2003  Job:  [id number]   cc:   Corrie Mckusick, M.D.  [address] [place]. [place]  [place]  [place] [id number]  Fax: [phone]

## 2010-08-26 NOTE — Op Note (Signed)
NAME:  [name] [name], [name] [name]                 ACCOUNT NO.:  [id number]   MEDICAL RECORD NO.:  [id number]          PATIENT TYPE:  AMB   LOCATION:  DAY                           FACILITY:  [facility]   PHYSICIAN:  Ky Barban, M.D.DATE OF BIRTH:  08-Feb-1934   DATE OF PROCEDURE:  10/04/2004  DATE OF DISCHARGE:                                 OPERATIVE REPORT   PREOPERATIVE DIAGNOSIS:  Benign prostatic hypertrophy.   POSTOPERATIVE DIAGNOSIS:  Benign prostatic hypertrophy.   PROCEDURE:  __________  of prostate.   ANESTHESIA:  Spinal.   PROCEDURE:  The patient was given spinal anesthesia, placed in lithotomy  position.  After sterile prep and drape, #28 Iglesias resectoscope was  introduced into the bladder. It was inspected. The resectoscope was pulled  back in mid prostatic urethra. The medial lobe was resected up to the level  of the verumontanum. Then the bladder neck was circumferentially dissected  down to the circle of fibers. At this point, the resectoscope was pulled  back at the level of the verumontanum, rotated to the 11 o'clock position.  Resection of the right lobe was done between 11 and 7 o'clock position.  Similarly, the left lobe was resected between 1 and 5 o'clock position  __________ . There was a small amount of tissue in the anterior midline and  at the apex posteriorly. It was resected very carefully, not any of  sphincter of the verumontanum. Prostatic urethra looks wide open. Chips were  evacuated. Bleeders were coagulated. Bladder neck is now open. Resectoscope  was removed. A __________  Foley catheter is inserted, balloon inflated to  30 cc. Through and through started which was clear. The patient left in the  operating room in satisfactory condition.       MIJ/MEDQ  D:  10/04/2004  T:  10/04/2004  Job:  [id number]   cc:   Corrie Mckusick, M.D.  Fax: [phone]

## 2010-08-26 NOTE — Op Note (Signed)
NAME:  [name] [name], [name] [name]                 ACCOUNT NO.:  [id number]   MEDICAL RECORD NO.:  [id number]          PATIENT TYPE:  AMB   LOCATION:  DAY                           FACILITY:  [facility]   PHYSICIAN:  Ky Barban, M.D.DATE OF BIRTH:  03/07/1934   DATE OF PROCEDURE:  06/14/2004  DATE OF DISCHARGE:                                 OPERATIVE REPORT   PREOPERATIVE DIAGNOSIS:  Bladder tumor.   POSTOPERATIVE DIAGNOSIS:  Bladder tumor.   PROCEDURES:  Transurethral resection of bladder tumor, large.   SURGEON:  Ky Barban, M.D.   ANESTHESIA:  Spinal.   DESCRIPTION OF PROCEDURE:  The patient was given spinal anesthesia and was  placed in the lithotomy position after usual prep and drape.  A #28 Iglesias  resectoscope was introduced into the bladder.  The tumor was located on the  left bladder wall.  Resection of the tumor was started from the surface and  it appeared to be a superficial tumor.  It was completely resected.  The  base and the surrounding area was fulgurated.  Chips were all evacuated.  Bleeders were coagulated.  At the end, there was no bleeding.  The  resectoscope was removed.  I had inspected his bladder.  The rest of the  bladder looked fine.  The resectoscope was removed.  A 22 Foley catheter was  left in for drainage.  The patient left the operating room in satisfactory  condition.      MIJ/MEDQ  D:  06/14/2004  T:  06/14/2004  Job:  [id number]

## 2010-11-23 ENCOUNTER — Encounter (INDEPENDENT_AMBULATORY_CARE_PROVIDER_SITE_OTHER): Payer: Self-pay

## 2010-11-30 ENCOUNTER — Ambulatory Visit (INDEPENDENT_AMBULATORY_CARE_PROVIDER_SITE_OTHER): Payer: Medicare Other | Admitting: Internal Medicine

## 2010-11-30 VITALS — BP 110/74 | HR 68 | Temp 97.7°F | Ht 70.5 in | Wt 244.8 lb

## 2010-11-30 DIAGNOSIS — R1310 Dysphagia, unspecified: Secondary | ICD-10-CM

## 2010-11-30 DIAGNOSIS — R05 Cough: Secondary | ICD-10-CM

## 2010-11-30 DIAGNOSIS — R1314 Dysphagia, pharyngoesophageal phase: Secondary | ICD-10-CM

## 2010-11-30 DIAGNOSIS — J029 Acute pharyngitis, unspecified: Secondary | ICD-10-CM

## 2010-11-30 DIAGNOSIS — R059 Cough, unspecified: Secondary | ICD-10-CM

## 2010-11-30 NOTE — Progress Notes (Signed)
Subjective:     Patient ID: [name] [name], male   DOB: 02-22-1934, 77 y.o.   MRN: [id number]  HPI  [name] is a 77 yr old male presenting today with c/o of dysphagia. He says when he swallow his foods are lodging.  He has to cough the bolus back up.   Symptoms  December. His last EGD/ED was in December of 2011 for intermittent dysphagia. Revealed mild changes of reflux esophagitis limited to GE junction and a small sliding hiatal hernia, otherwise normal EGD. Esophagus dilated by passing 56 [place] Maloney dilator. He also has a cough for 3-4 weeks. He says he coughing up clear mucous. He also says he has a sore throat.  He denies running a fever, or chills.  Occasionally has epigastric pain,. Appetite is good. No weight loss. He has BM every 1-2 days.  He takes Miralax on a prn basis. No melena or bright red rectal bleeding.  Chicken breast will lodge but not the  dark meat of chicken.   Review of Systems see hpi  Allergies: NKA   Current Outpatient Prescriptions  Medication Sig Dispense Refill  . donepezil (ARICEPT) 10 MG tablet Take 10 mg by mouth at bedtime as needed.        [name] levothyroxine (SYNTHROID, LEVOTHROID) 150 MCG tablet Take 150 mcg by mouth daily.        . memantine (NAMENDA) 10 MG tablet Take 10 mg by mouth 2 (two) times daily.        . pantoprazole (PROTONIX) 40 MG tablet Take 40 mg by mouth daily.        . polyethylene glycol (MIRALAX / GLYCOLAX) packet Take 17 g by mouth daily.        . solifenacin (VESICARE) 5 MG tablet Take 10 mg by mouth daily.         Vitamin D 3 1000 IU one a day Vitamin B12 injections once a month  Objective:   Physical Exam Blood pressure 110/74, pulse 68, temperature 97.7 F (36.5 C), height 5' 10.5" (1.791 m), weight 244 lb 12.8 oz (111.041 kg). .Alert and oriented. Skin warm and dry. Oral mucosa is moist. Natural teeth in good condition. Sclera anicteric, conjunctivae is pink. Thyroid not enlarged. No cervical lymphadenopathy. Lungs clear. Heart  regular rate and rhythm.  Abdomen is soft. Bowel sounds are positive. No hepatomegaly. No abdominal masses felt. No tenderness.  No edema to lower extremities. Patient is alert and oriented.     Assessment:     Solid food dysphagia. Cough/Sorethroat. Esophageal stricture/ web needs to be ruled out.  Cough/sorethroat: probably seasonal. He has not had a fever or chills. He has not smoked in years.    Plan:     *Will get a DG esophagus.   Salt water gargles for his sorethroat. Further recommendations once we have the pill study back.     Marland Kitchenv

## 2010-11-30 NOTE — Patient Instructions (Signed)
Will obtain a pill esophogram.  Try Afrin 12 hr tabs and salt water gargles.  Further recommendations once we have the pill study back.

## 2010-12-01 ENCOUNTER — Ambulatory Visit (HOSPITAL_COMMUNITY)
Admission: RE | Admit: 2010-12-01 | Discharge: 2010-12-01 | Disposition: A | Discharge status: Home / Self Care | Payer: Medicare Other | Source: Ambulatory Visit | Attending: Internal Medicine | Admitting: Internal Medicine

## 2010-12-01 DIAGNOSIS — R1319 Other dysphagia: Secondary | ICD-10-CM | POA: Insufficient documentation

## 2010-12-01 DIAGNOSIS — R1310 Dysphagia, unspecified: Secondary | ICD-10-CM

## 2010-12-01 DIAGNOSIS — K224 Dyskinesia of esophagus: Secondary | ICD-10-CM | POA: Insufficient documentation

## 2010-12-07 ENCOUNTER — Telehealth (INDEPENDENT_AMBULATORY_CARE_PROVIDER_SITE_OTHER): Payer: Self-pay | Admitting: Internal Medicine

## 2010-12-07 NOTE — Telephone Encounter (Signed)
Results given to patient. I discussed this case with Dr. Karilyn Cota.  He is to chew his foods well. Stay on a soft diet.  No grilled chicken, steaks, etc.  If he symptoms worsen he will call for an appt. Dr. Karilyn Cota will consider EGD/ED then.  He did not have any relief when he had is last  EGD/ED recently.    [name] [name]

## 2011-01-03 LAB — URINALYSIS, ROUTINE W REFLEX MICROSCOPIC
Bilirubin Urine: NEGATIVE
Nitrite: NEGATIVE
Protein, ur: NEGATIVE
Urobilinogen, UA: 0.2

## 2011-01-03 LAB — URINE MICROSCOPIC-ADD ON

## 2011-01-24 LAB — URINALYSIS, ROUTINE W REFLEX MICROSCOPIC
Bilirubin Urine: NEGATIVE
Ketones, ur: NEGATIVE
Nitrite: NEGATIVE
Protein, ur: NEGATIVE
Urobilinogen, UA: 1

## 2011-01-24 LAB — CBC
HCT: 41.6
MCV: 88.3
Platelets: 200
RDW: 13.3

## 2011-01-24 LAB — BASIC METABOLIC PANEL
BUN: 12
Chloride: 107
Creatinine, Ser: 0.95
Glucose, Bld: 108 — ABNORMAL HIGH
Potassium: 4.6

## 2011-02-28 ENCOUNTER — Telehealth (INDEPENDENT_AMBULATORY_CARE_PROVIDER_SITE_OTHER): Payer: Self-pay | Admitting: Internal Medicine

## 2011-02-28 NOTE — Telephone Encounter (Signed)
Patient called needing to make an appt. He said his throat burns all the way down really bad. Latasha Puskas that we didn't have anything available today that it would be next week. Offered an apt for 03/08/11. He could not wait until then and said he would try to get in to see Dr. Andrey Campanile in [place].

## 2011-05-10 ENCOUNTER — Other Ambulatory Visit (HOSPITAL_COMMUNITY): Payer: Self-pay | Admitting: Urology

## 2011-05-10 DIAGNOSIS — N200 Calculus of kidney: Secondary | ICD-10-CM

## 2011-05-25 ENCOUNTER — Ambulatory Visit (HOSPITAL_COMMUNITY)
Admission: RE | Admit: 2011-05-25 | Discharge: 2011-05-25 | Disposition: A | Discharge status: Home / Self Care | Payer: Medicare Other | Source: Ambulatory Visit | Attending: Urology | Admitting: Urology

## 2011-05-25 DIAGNOSIS — R109 Unspecified abdominal pain: Secondary | ICD-10-CM | POA: Insufficient documentation

## 2011-05-25 DIAGNOSIS — N200 Calculus of kidney: Secondary | ICD-10-CM | POA: Insufficient documentation

## 2012-01-02 ENCOUNTER — Encounter (HOSPITAL_COMMUNITY): Payer: Self-pay | Admitting: Pharmacy Technician

## 2012-01-03 ENCOUNTER — Other Ambulatory Visit: Payer: Self-pay

## 2012-01-03 ENCOUNTER — Encounter (HOSPITAL_COMMUNITY): Payer: Self-pay

## 2012-01-03 ENCOUNTER — Encounter (HOSPITAL_COMMUNITY)
Admission: RE | Admit: 2012-01-03 | Discharge: 2012-01-03 | Payer: Medicare Other | Source: Ambulatory Visit | Attending: Urology | Admitting: Urology

## 2012-01-03 LAB — HEMOGLOBIN AND HEMATOCRIT, BLOOD: HCT: 43.7 % (ref 39.0–52.0)

## 2012-01-03 LAB — BASIC METABOLIC PANEL
BUN: 17 mg/dL (ref 6–23)
CO2: 26 mEq/L (ref 19–32)
Calcium: 10 mg/dL (ref 8.4–10.5)
Creatinine, Ser: 1.15 mg/dL (ref 0.50–1.35)
Glucose, Bld: 184 mg/dL — ABNORMAL HIGH (ref 70–99)

## 2012-01-03 NOTE — Patient Instructions (Addendum)
[address]  01/03/2012   Your procedure is scheduled on:  01/09/2012   Report to [facility] at  0730  AM.  Call this number if you have problems the morning of surgery: [phone]   Remember:   Do not eat food:After Midnight.  May have clear liquids:until Midnight .    Take these medicines the morning of surgery with A SIP OF WATER:  Aricept,synthroid,protonix,namenda   Do not wear jewelry, make-up or nail polish.  Do not wear lotions, powders, or perfumes. You may wear deodorant.  Do not shave 48 hours prior to surgery. Men may shave face and neck.  Do not bring valuables to the hospital.  Contacts, dentures or bridgework may not be worn into surgery.  Leave suitcase in the car. After surgery it may be brought to your room.  For patients admitted to the hospital, checkout time is 11:00 AM the day of discharge.   Patients discharged the day of surgery will not be allowed to drive home.  Name and phone number of your driver: family  Special Instructions: Shower using CHG 2 nights before surgery and the night before surgery.  If you shower the day of surgery use CHG.  Use special wash - you have one bottle of CHG for all showers.  You should use approximately 1/3 of the bottle for each shower.   Please read over the following fact sheets that you were given: Pain Booklet, MRSA Information, Surgical Site Infection Prevention, Anesthesia Post-op Instructions and Care and Recovery After Surgery Cystoscopy (Bladder Exam) A cystoscopy is an examination of your urinary bladder with a cystoscope. A cystoscope is an instrument like a small telescope with strong lights and lenses. It is inserted into the bladder through the urethra (the opening into the bladder) and allows your caregiver to examine the inside of your bladder. The procedure causes little discomfort and can be done in a hospital or office. It is a diagnostic procedure to evaluate the inside of your bladder. It may involve  x-rays to further evaluate the ureters or internal aspects of the kidneys. It may aid in the removal of urinary stones  or in taking tissue samples (biopsies) if necessary. The procedure is easier in females because of a shorter urethra. In a male, the procedure must be done through the penis. This often requires more sedation and more time to do the procedure. The procedure usually takes twenty minutes to one half hour for a male and approximately an hour for a male. LET YOUR CAREGIVERS KNOW ABOUT:  Allergies.   Medications taken including herbs, eye drops, over the counter medications, and creams.   Use of steroids (by mouth or creams).   Previous problems with anesthetics or novocaine.   Possibility of pregnancy, if this applies.   History of blood clots (thrombophlebitis).   History of bleeding or blood problems.   Previous surgery, especially where prosthetics have been used like hip or knee replacements, and heart valve replacements.   Other health problems.  BEFORE THE PROCEDURE  You should be present 60 minutes prior to your procedure or as directed.  PROCEDURE During the procedure, you will:  Be assisted by your urologist and a nurse.   Lie on a cystoscopy table with your knees elevated and legs apart and covered with a drape. For women this is the same position as when a pap smear is taken.   Have the urethral area or penis washed and covered with  sterile towels.   Have an anesthetic (numbing) jelly applied to the urethra. This is usually all that is required for females but males may also require sedation.   Have the cystoscope inserted through the urethra and into the bladder. Sterile fluid will flow through the cystoscope and into the bladder. This will expand the bladder and provide clear fluid for the urologist to look through and examine the interior of the bladder.   Be allowed to go home once you are doing well, are stable, and awake if you were given a  sedative. If given a sedative, have someone give you a ride home.  AFTER THE PROCEDURE  You may have temporary bleeding and burning on urination.   Drink lots of fluids.  SEEK IMMEDIATE MEDICAL CARE IF:  There is an increase in blood in the urine or if you are passing clots.   You have difficulty in passing your urine   You develop chills and/or an unexplained oral temperature above 102 F (38.9 C).  Your caregiver will discuss your results with you following the procedure. This may be at a later time if you have been sedated. If other testing or biopsies were taken, ask your caregiver how you are to obtain the results. Remember it is your responsibility to get your results. Do not assume everything is normal if you do not hear from your caregiver. Document Released: 03/24/2000 Document Revised: 03/16/2011 Document Reviewed: 01/16/2008 [facility] Patient Information 2012 [place], [place].PATIENT INSTRUCTIONS POST-ANESTHESIA  IMMEDIATELY FOLLOWING SURGERY:  Do not drive or operate machinery for the first twenty four hours after surgery.  Do not make any important decisions for twenty four hours after surgery or while taking narcotic pain medications or sedatives.  If you develop intractable nausea and vomiting or a severe headache please notify your doctor immediately.  FOLLOW-UP:  Please make an appointment with your surgeon as instructed. You do not need to follow up with anesthesia unless specifically instructed to do so.  WOUND CARE INSTRUCTIONS (if applicable):  Keep a dry clean dressing on the anesthesia/puncture wound site if there is drainage.  Once the wound has quit draining you may leave it open to air.  Generally you should leave the bandage intact for twenty four hours unless there is drainage.  If the epidural site drains for more than 36-48 hours please call the anesthesia department.  QUESTIONS?:  Please feel free to call your physician or the hospital operator if you have any  questions, and they will be happy to assist you.

## 2012-01-09 ENCOUNTER — Encounter (HOSPITAL_COMMUNITY): Payer: Self-pay | Admitting: Anesthesiology

## 2012-01-09 ENCOUNTER — Ambulatory Visit (HOSPITAL_COMMUNITY)
Admission: RE | Admit: 2012-01-09 | Discharge: 2012-01-10 | Disposition: A | Discharge status: Home / Self Care | Payer: Medicare Other | Source: Ambulatory Visit | Attending: Urology | Admitting: Urology

## 2012-01-09 ENCOUNTER — Encounter (HOSPITAL_COMMUNITY)
Admission: RE | Disposition: A | Discharge status: Home / Self Care | Payer: Self-pay | Source: Ambulatory Visit | Attending: Urology

## 2012-01-09 ENCOUNTER — Encounter (HOSPITAL_COMMUNITY): Payer: Self-pay | Admitting: *Deleted

## 2012-01-09 ENCOUNTER — Ambulatory Visit (HOSPITAL_COMMUNITY): Payer: Medicare Other | Admitting: Anesthesiology

## 2012-01-09 DIAGNOSIS — C679 Malignant neoplasm of bladder, unspecified: Secondary | ICD-10-CM

## 2012-01-09 DIAGNOSIS — Z01810 Encounter for preprocedural cardiovascular examination: Secondary | ICD-10-CM | POA: Insufficient documentation

## 2012-01-09 DIAGNOSIS — Z01812 Encounter for preprocedural laboratory examination: Secondary | ICD-10-CM | POA: Insufficient documentation

## 2012-01-09 DIAGNOSIS — C61 Malignant neoplasm of prostate: Secondary | ICD-10-CM | POA: Insufficient documentation

## 2012-01-09 DIAGNOSIS — D303 Benign neoplasm of bladder: Secondary | ICD-10-CM | POA: Insufficient documentation

## 2012-01-09 HISTORY — PX: CYSTOSCOPY WITH BIOPSY: SHX5122

## 2012-01-09 HISTORY — PX: CYSTOSTOMY W/ BLADDER BIOPSY: SHX1431

## 2012-01-09 SURGERY — CYSTOSCOPY, WITH BIOPSY
Anesthesia: General | Site: Bladder | Wound class: Clean Contaminated

## 2012-01-09 MED ORDER — STERILE WATER FOR IRRIGATION IR SOLN
Status: DC | PRN
  Administered 2012-01-09: 1000 mL

## 2012-01-09 MED ORDER — VITAMIN B-12 1000 MCG PO TABS
1000.0000 ug | ORAL_TABLET | Freq: Every day | ORAL | Status: DC
  Administered 2012-01-10: 1000 ug via ORAL
  Filled 2012-01-09 (×2): qty 1

## 2012-01-09 MED ORDER — VITAMIN D 1000 UNITS PO TABS
1000.0000 [IU] | ORAL_TABLET | Freq: Every day | ORAL | Status: DC
  Administered 2012-01-10: 1000 [IU] via ORAL
  Filled 2012-01-09 (×2): qty 1

## 2012-01-09 MED ORDER — ARTIFICIAL TEARS OP OINT
TOPICAL_OINTMENT | OPHTHALMIC | Status: AC
  Filled 2012-01-09: qty 3.5

## 2012-01-09 MED ORDER — ONDANSETRON HCL 4 MG/2ML IJ SOLN
INTRAMUSCULAR | Status: AC
  Filled 2012-01-09: qty 2

## 2012-01-09 MED ORDER — BUPIVACAINE IN DEXTROSE 0.75-8.25 % IT SOLN
INTRATHECAL | Status: AC
  Filled 2012-01-09: qty 2

## 2012-01-09 MED ORDER — FENTANYL CITRATE 0.05 MG/ML IJ SOLN
INTRAMUSCULAR | Status: AC
  Filled 2012-01-09: qty 2

## 2012-01-09 MED ORDER — DONEPEZIL HCL 5 MG PO TABS
10.0000 mg | ORAL_TABLET | Freq: Every day | ORAL | Status: DC
  Administered 2012-01-09: 10 mg via ORAL
  Filled 2012-01-09: qty 2

## 2012-01-09 MED ORDER — ACETAMINOPHEN 325 MG PO TABS
650.0000 mg | ORAL_TABLET | Freq: Four times a day (QID) | ORAL | Status: DC | PRN
  Administered 2012-01-10: 650 mg via ORAL
  Filled 2012-01-09: qty 2

## 2012-01-09 MED ORDER — POLYETHYLENE GLYCOL 3350 17 G PO PACK
17.0000 g | PACK | Freq: Every day | ORAL | Status: DC
  Administered 2012-01-09 – 2012-01-10 (×2): 17 g via ORAL
  Filled 2012-01-09 (×2): qty 1

## 2012-01-09 MED ORDER — GLYCINE 1.5 % IR SOLN
Status: DC | PRN
  Administered 2012-01-09 (×3): 3000 mL

## 2012-01-09 MED ORDER — CYANOCOBALAMIN 1000 MCG/ML IJ SOLN: 1000.0000 ug | INTRAMUSCULAR | Status: DC

## 2012-01-09 MED ORDER — MORPHINE SULFATE 4 MG/ML IJ SOLN
3.0000 mg | INTRAMUSCULAR | Status: DC | PRN
  Administered 2012-01-09 – 2012-01-10 (×3): 3 mg via INTRAVENOUS
  Filled 2012-01-09 (×3): qty 1

## 2012-01-09 MED ORDER — GLYCOPYRROLATE 0.2 MG/ML IJ SOLN
INTRAMUSCULAR | Status: DC | PRN
  Administered 2012-01-09: 0.2 mg via INTRAVENOUS

## 2012-01-09 MED ORDER — FENTANYL CITRATE 0.05 MG/ML IJ SOLN
25.0000 ug | INTRAMUSCULAR | Status: DC | PRN
  Administered 2012-01-09 (×3): 50 ug via INTRAVENOUS

## 2012-01-09 MED ORDER — PANTOPRAZOLE SODIUM 40 MG PO TBEC
40.0000 mg | DELAYED_RELEASE_TABLET | Freq: Every day | ORAL | Status: DC
Start: 2012-01-09 — End: 2012-01-10
  Administered 2012-01-10: 40 mg via ORAL
  Filled 2012-01-09 (×2): qty 1

## 2012-01-09 MED ORDER — LIDOCAINE HCL (CARDIAC) 10 MG/ML IV SOLN
INTRAVENOUS | Status: DC | PRN
  Administered 2012-01-09: 50 mg via INTRAVENOUS

## 2012-01-09 MED ORDER — MEMANTINE HCL 10 MG PO TABS
10.0000 mg | ORAL_TABLET | Freq: Two times a day (BID) | ORAL | Status: DC
  Administered 2012-01-09 – 2012-01-10 (×2): 10 mg via ORAL
  Filled 2012-01-09 (×3): qty 1

## 2012-01-09 MED ORDER — SUCCINYLCHOLINE CHLORIDE 20 MG/ML IJ SOLN
INTRAMUSCULAR | Status: AC
  Filled 2012-01-09: qty 1

## 2012-01-09 MED ORDER — LACTATED RINGERS IV SOLN
INTRAVENOUS | Status: DC | PRN
  Administered 2012-01-09: 09:00:00 via INTRAVENOUS

## 2012-01-09 MED ORDER — LACTATED RINGERS IV SOLN
INTRAVENOUS | Status: DC
  Administered 2012-01-09: 1000 mL via INTRAVENOUS

## 2012-01-09 MED ORDER — MIDAZOLAM HCL 2 MG/2ML IJ SOLN
1.0000 mg | INTRAMUSCULAR | Status: DC | PRN
  Administered 2012-01-09 (×2): 2 mg via INTRAVENOUS

## 2012-01-09 MED ORDER — LEVOTHYROXINE SODIUM 25 MCG PO TABS
125.0000 ug | ORAL_TABLET | Freq: Every day | ORAL | Status: DC
  Administered 2012-01-10: 125 ug via ORAL
  Filled 2012-01-09 (×2): qty 1

## 2012-01-09 MED ORDER — MIDAZOLAM HCL 2 MG/2ML IJ SOLN
INTRAMUSCULAR | Status: AC
  Filled 2012-01-09: qty 2

## 2012-01-09 MED ORDER — FENTANYL CITRATE 0.05 MG/ML IJ SOLN
INTRAMUSCULAR | Status: DC | PRN
  Administered 2012-01-09: 100 ug via INTRAVENOUS

## 2012-01-09 MED ORDER — GLYCOPYRROLATE 0.2 MG/ML IJ SOLN
INTRAMUSCULAR | Status: AC
  Filled 2012-01-09: qty 1

## 2012-01-09 MED ORDER — LIDOCAINE HCL (PF) 1 % IJ SOLN
INTRAMUSCULAR | Status: AC
  Filled 2012-01-09: qty 5

## 2012-01-09 MED ORDER — ONDANSETRON HCL 4 MG/2ML IJ SOLN
4.0000 mg | Freq: Once | INTRAMUSCULAR | Status: AC
  Administered 2012-01-09: 4 mg via INTRAVENOUS

## 2012-01-09 MED ORDER — PROPOFOL 10 MG/ML IV EMUL
INTRAVENOUS | Status: DC | PRN
  Administered 2012-01-09: 150 mg via INTRAVENOUS

## 2012-01-09 MED ORDER — PROPOFOL 10 MG/ML IV EMUL
INTRAVENOUS | Status: AC
  Filled 2012-01-09: qty 20

## 2012-01-09 MED ORDER — ONDANSETRON HCL 4 MG/2ML IJ SOLN
4.0000 mg | Freq: Once | INTRAMUSCULAR | Status: AC | PRN
  Administered 2012-01-09: 4 mg via INTRAVENOUS

## 2012-01-09 SURGICAL SUPPLY — 25 items
BAG DRAIN URO TABLE W/ADPT NS (DRAPE) ×2 IMPLANT
BAG DRN 8 ADPR NS SKTRN CSTL (DRAPE) ×1
BAG HAMPER (MISCELLANEOUS) ×2 IMPLANT
BAG URINE DRAINAGE (UROLOGICAL SUPPLIES) ×1 IMPLANT
CATH FOLEY 2WAY SLVR  5CC 20FR (CATHETERS) ×1
CATH FOLEY 2WAY SLVR 5CC 20FR (CATHETERS) IMPLANT
CLOTH BEACON ORANGE TIMEOUT ST (SAFETY) ×2 IMPLANT
FORMALIN 10 PREFIL 120ML (MISCELLANEOUS) ×10 IMPLANT
GLOVE BIO SURGEON STRL SZ7 (GLOVE) ×2 IMPLANT
GLOVE BIOGEL PI IND STRL 7.5 (GLOVE) IMPLANT
GLOVE BIOGEL PI INDICATOR 7.5 (GLOVE) ×1
GLOVE ECLIPSE 7.0 STRL STRAW (GLOVE) ×1 IMPLANT
GLOVE EXAM NITRILE MD LF STRL (GLOVE) ×1 IMPLANT
GLYCINE 1.5% IRRIG UROMATIC (IV SOLUTION) ×4 IMPLANT
GOWN STRL REIN XL XLG (GOWN DISPOSABLE) ×2 IMPLANT
IV NS IRRIG 3000ML ARTHROMATIC (IV SOLUTION) ×2 IMPLANT
KIT ROOM TURNOVER AP CYSTO (KITS) ×2 IMPLANT
MANIFOLD NEPTUNE II (INSTRUMENTS) ×2 IMPLANT
NDL HYPO 18GX1.5 BLUNT FILL (NEEDLE) ×1 IMPLANT
NEEDLE HYPO 18GX1.5 BLUNT FILL (NEEDLE) ×2 IMPLANT
PACK CYSTO (CUSTOM PROCEDURE TRAY) ×2 IMPLANT
PAD ARMBOARD 7.5X6 YLW CONV (MISCELLANEOUS) ×2 IMPLANT
PAD TELFA 3X4 1S STER (GAUZE/BANDAGES/DRESSINGS) ×2 IMPLANT
TOWEL OR 17X26 4PK STRL BLUE (TOWEL DISPOSABLE) ×1 IMPLANT
WATER STERILE IRR 1000ML POUR (IV SOLUTION) ×2 IMPLANT

## 2012-01-09 NOTE — Anesthesia Postprocedure Evaluation (Signed)
  Anesthesia Post-op Note  Patient: [name] [name]  Procedure(s) Performed: Procedure(s) (LRB) with comments: CYSTOSCOPY WITH BIOPSY (N/A) - Cystoscopy with Multiple Bladder Biopsies  Patient Location: PACU  Anesthesia Type: General  Level of Consciousness: awake, alert , oriented and patient cooperative  Airway and Oxygen Therapy: Patient Spontanous Breathing and Patient connected to face mask oxygen  Post-op Pain: mild  Post-op Assessment: Post-op Vital signs reviewed, Patient's Cardiovascular Status Stable, Respiratory Function Stable, Patent Airway and No signs of Nausea or vomiting  Post-op Vital Signs: Reviewed and stable  Complications: No apparent anesthesia complications

## 2012-01-09 NOTE — Anesthesia Preprocedure Evaluation (Signed)
Anesthesia Evaluation  Patient identified by MRN, date of birth, ID band Patient awake    Reviewed: Allergy & Precautions, H&P , NPO status , Patient's Chart, lab work & pertinent test results  Airway Mallampati: II TM Distance: >3 FB     Dental  (+) Teeth Intact   Pulmonary shortness of breath, former smoker,  breath sounds clear to auscultation        Cardiovascular negative cardio ROS  Rhythm:Regular     Neuro/Psych PSYCHIATRIC DISORDERS (alzheimers dementia)    GI/Hepatic GERD-  Medicated and Controlled,  Endo/Other  Hypothyroidism   Renal/GU      Musculoskeletal   Abdominal   Peds  Hematology   Anesthesia Other Findings   Reproductive/Obstetrics                           Anesthesia Physical Anesthesia Plan  ASA: III  Anesthesia Plan: General   Post-op Pain Management:    Induction: Intravenous, Rapid sequence and Cricoid pressure planned  Airway Management Planned: Oral ETT  Additional Equipment:   Intra-op Plan:   Post-operative Plan: Extubation in OR  Informed Consent: I have reviewed the patients History and Physical, chart, labs and discussed the procedure including the risks, benefits and alternatives for the proposed anesthesia with the patient or authorized representative who has indicated his/her understanding and acceptance.     Plan Discussed with:   Anesthesia Plan Comments:         Anesthesia Quick Evaluation

## 2012-01-09 NOTE — Anesthesia Procedure Notes (Signed)
Procedure Name: Intubation Date/Time: 01/09/2012 10:03 AM Performed by: Carolyne Littles, AMY L Pre-anesthesia Checklist: Patient identified, Patient being monitored, Emergency Drugs available, Timeout performed and Suction available Patient Re-evaluated:Patient Re-evaluated prior to inductionOxygen Delivery Method: Circle system utilized Preoxygenation: Pre-oxygenation with 100% oxygen Intubation Type: IV induction, Cricoid Pressure applied and Rapid sequence Ventilation: Mask ventilation without difficulty Laryngoscope Size: Miller and 3 Grade View: Grade I Tube type: Oral Tube size: 7.0 mm Number of attempts: 1 Placement Confirmation: ETT inserted through vocal cords under direct vision,  positive ETCO2 and breath sounds checked- equal and bilateral Secured at: 22 cm Tube secured with: Tape Dental Injury: Teeth and Oropharynx as per pre-operative assessment

## 2012-01-09 NOTE — Brief Op Note (Signed)
01/09/2012  10:41 AM  PATIENT:  [name] [name]  78 y.o. male  PRE-OPERATIVE DIAGNOSIS:  History of bladder tumor and prostate cancer  POST-OPERATIVE DIAGNOSIS:  History of bladder tumor and prostate cancer  PROCEDURE:  Procedure(s) (LRB) with comments: CYSTOSCOPY WITH BIOPSY (N/A) - Cystoscopy with Multiple Bladder Biopsies  SURGEON:  Surgeon(s) and Role:    * Ky Barban, MD - Primary  PHYSICIAN ASSISTANT:   ASSISTANTS: none   ANESTHESIA:   general  EBL:  Total I/O In: 500 [I.V.:500] Out: -   BLOOD ADMINISTERED:none  DRAINS: foley catheter   LOCAL MEDICATIONS USED:  NONE  SPECIMEN:  Source of Specimen:  multiple bladder biopsiesand fulguration biopsy sites.  DISPOSITION OF SPECIMEN:  PATHOLOGY  COUNTS:  YES  TOURNIQUET:  * No tourniquets in log *  DICTATION: .Other Dictation: Dictation Number [id number]  PLAN OF CARE: Admit for overnight observation  PATIENT DISPOSITION:  PACU - hemodynamically stable.   Delay start of Pharmacological VTE agent (>24hrs) due to surgical blood loss or risk of bleeding:

## 2012-01-09 NOTE — Transfer of Care (Signed)
  Anesthesia Post-op Note  Patient: [name] [name]  Procedure(s) Performed: Procedure(s) (LRB) with comments: CYSTOSCOPY WITH BIOPSY (N/A) - Cystoscopy with Multiple Bladder Biopsies  Patient Location: PACU  Anesthesia Type: General  Level of Consciousness: awake, alert , oriented and patient cooperative  Airway and Oxygen Therapy: Patient Spontanous Breathing and Patient connected to face mask oxygen  Post-op Pain: mild  Post-op Assessment: Post-op Vital signs reviewed, Patient's Cardiovascular Status Stable, Respiratory Function Stable, Patent Airway and No signs of Nausea or vomiting  Post-op Vital Signs: Reviewed and stable  Complications: No apparent anesthesia complications 

## 2012-01-09 NOTE — Progress Notes (Signed)
No change in H&P on reexamination. 

## 2012-01-09 NOTE — Preoperative (Signed)
Beta Blockers   Reason not to administer Beta Blockers:Not Applicable 

## 2012-01-09 NOTE — H&P (Signed)
NAME:  [name] [name], [name] [name]                 ACCOUNT NO.:  [id number]  MEDICAL RECORD NO.:  [id number]  LOCATION:  DOIB                          FACILITY:  [facility]  PHYSICIAN:  Ky Barban, M.D.DATE OF BIRTH:  1933/10/03  DATE OF ADMISSION:  01/03/2012 DATE OF DISCHARGE:  LH                             HISTORY & PHYSICAL   CHIEF COMPLAINT:  Questionable recurrent bladder cancer.  HISTORY:  This 78 year old gentleman who is known to have bladder cancer, low-grade and also low-grade prostate cancer, which we are just observing.  He had a routine cystoscopy done, I did not see any recurrence in the bladder, although previously he had been having superficial recurrent tumors, but his urine cytologies are showing suspicious cells, so that thinking that maybe he has carcinoma in situ. So I told him that I will go ahead and do multiple bladder biopsies under anesthesia as outpatient and procedure limitation and complications are discussed.  PAST MEDICAL HISTORY:  He does suffer from erectile dysfunction.  Also, has low-grade prostate cancer, which we decided just to follow his BSS. Recently, he passed a small renal calculus.  PERSONAL HISTORY:  He does not smoke or drink.  He had this TUR prostate done in 2006 for BPH, but incidental finding was low-grade adenocarcinoma of the prostate.  No other significant medical problem.  REVIEW OF SYSTEMS:  Unremarkable.  PHYSICAL EXAMINATION:  GENERAL:  Well-nourished, well-developed male, not in acute distress. VITAL SIGNS:  Blood pressure 120/80, temperature is normal. CENTRAL NERVOUS SYSTEM:  No gross neurological deficit. HEAD, NECK, EYES, ENT:  Negative. CHEST:  Symmetrical. HEART:  Regular sinus rhythm.  No murmur. ABDOMEN:  Soft, flat.  Liver, spleen, and kidneys are not palpable.  No CVA tenderness. GENITOURINARY:  External genitalia is circumcised, meatus adequate. Testicles are normal. RECTAL:  Normal rectal exam, normal sphincter tone.   No rectal mass. Prostate 1.5+, smooth and firm.  IMPRESSION: 1. Recurrent bladder cancer, no recurrence on cystoscopy, but urine     cytologies are positive, so we will do     multiple bladder biopsies. 2. Diagnosis of low-grade adenocarcinoma of prostate.  The patient is     coming as outpatient, we will undergo multiple bladder biopsies     under anesthesia.     Ky Barban, M.D.     MIJ/MEDQ  D:  01/08/2012  T:  01/09/2012  Job:  [id number]

## 2012-01-10 MED ORDER — SODIUM CHLORIDE 0.9 % IJ SOLN
INTRAMUSCULAR | Status: AC
  Administered 2012-01-10: 14:00:00
  Filled 2012-01-10: qty 3

## 2012-01-10 MED ORDER — ONDANSETRON HCL 4 MG/2ML IJ SOLN
4.0000 mg | Freq: Once | INTRAMUSCULAR | Status: AC
  Administered 2012-01-10: 4 mg via INTRAVENOUS

## 2012-01-10 MED ORDER — ONDANSETRON HCL 4 MG/2ML IJ SOLN
INTRAMUSCULAR | Status: AC
  Administered 2012-01-10: 4 mg
  Filled 2012-01-10: qty 2

## 2012-01-10 NOTE — Addendum Note (Signed)
Addendum  created 01/10/12 1316 by Franco Nones, CRNA   Modules edited:Notes Section

## 2012-01-10 NOTE — Op Note (Signed)
NAME:  [name] [name], [name] [name]                 ACCOUNT NO.:  [id number]  MEDICAL RECORD NO.:  [id number]  LOCATION:  A317                          FACILITY:  [facility]  PHYSICIAN:  Ky Barban, M.D.DATE OF BIRTH:  May 19, 1933  DATE OF PROCEDURE: DATE OF DISCHARGE:                              OPERATIVE REPORT   PREOPERATIVE DIAGNOSIS:  Bladder carcinoma.  POSTOPERATIVE DIAGNOSIS:  Normal cystoscopy.  Multiple bladder biopsies.  ANESTHESIA:  General.  The patient under general endotracheal anesthesia in lithotomy position. After usual prep and drape, #25 cystoscope was introduced into the bladder.  It was thoroughly inspected with four oblique right-angle lens.  I do not see any evidence of recurrence of the bladder tumor.  I do not see any changes like carcinoma in situ in the bladder and this is a patient who also has low-grade adenocarcinoma of the prostate, which we are treating just by observation.  So, I decided to do multiple bladder biopsies.  With the help of a flexible biopsy forceps, multiple bladder biopsies were taken.  They were labeled according to that, then the biopsy sites were simply fulgurated with the help of Greenwald electrode and at the end, there was no bleeding.  Cystoscope was removed and #20 Foley catheter left in for drainage.  The patient was taken to the recovery room in good condition.     Ky Barban, M.D.     MIJ/MEDQ  D:  01/09/2012  T:  01/10/2012  Job:  [id number]

## 2012-01-10 NOTE — Progress Notes (Signed)
Doing fine . Urine clear foley removed he is voiding satisfactory. Will dc home and see in office one week. Biopsy still pending.

## 2012-01-10 NOTE — Progress Notes (Signed)
Pt discharged home today per Dr. Jerre Simon. Pt's IV site D/C'd and WNL. Pt's VS stable at this time. Pt provided with home medication list and discharge instructions. Pt verbalized understanding. Pt left floor via WC in stable condition accompanied by NT.

## 2012-01-10 NOTE — Anesthesia Postprocedure Evaluation (Signed)
Anesthesia Post Note  Patient: [name] [name]  Procedure(s) Performed: Procedure(s) (LRB): CYSTOSCOPY WITH BIOPSY (N/A)  Anesthesia type: General  Patient location: 317  Post pain: Pain level controlled  Post assessment: Post-op Vital signs reviewed, Patient's Cardiovascular Status Stable, Respiratory Function Stable, Patent Airway, No signs of Nausea or vomiting and Pain level controlled  Last Vitals:  Filed Vitals:   01/10/12 1118  BP: 124/71  Pulse: 55  Temp: 36.8 C  Resp: 20    Post vital signs: Reviewed and stable  Level of consciousness: awake and alert   Complications: No apparent anesthesia complications

## 2012-01-10 NOTE — Discharge Summary (Addendum)
  Dictation#[id number]

## 2012-01-10 NOTE — Progress Notes (Signed)
UR Chart Review Completed-- OIB 

## 2012-01-11 NOTE — Discharge Summary (Signed)
NAME:  Laningham, Graham                 ACCOUNT NO.:  [id number]  MEDICAL RECORD NO.:  [id number]  LOCATION:  A317                          FACILITY:  [facility]  PHYSICIAN:  Ky Barban, M.D.DATE OF BIRTH:  [birth year]  DATE OF ADMISSION:  01/09/2012 DATE OF DISCHARGE:  10/02/2013LH                              DISCHARGE SUMMARY   Mr. [name] [name] is a 78 year old gentleman known to have bladder tumor. Cystoscopy in the office just showed couple of areas, which were suspicious, but there is no gross evidence of recurrence, so I brought him here to do multiple bladder biopsies, which were done yesterday under anesthesia.  He was kept overnight for observation.  He is doing fine.  Urine is clear, so we took out his Foley catheter this morning. He is voiding satisfactorily.  The other thing I want to mention, he also has low-grade adenocarcinoma of the prostate, which we are treating by watchful waiting, and I am following his PSA, which is within normal range, doing fine.  I do not see any significant rise, so I have not treated his prostate cancer.  So he is being discharged today, will be followed up in the office.  The pathology report is still pending.  He will come back and see me in the office in 1 week.  I have told him if he has any fever or any difficulty voiding or bleeding to let me know.  FINAL DISCHARGE DIAGNOSES: 1. Bladder tumor. 2. Prostate cancer.  He is advised to continue his usual medicines at home.     Ky Barban, M.D.     MIJ/MEDQ  D:  01/10/2012  T:  01/11/2012  Job:  [id number]

## 2012-01-12 ENCOUNTER — Encounter (HOSPITAL_COMMUNITY): Payer: Self-pay | Admitting: Urology

## 2012-02-14 ENCOUNTER — Other Ambulatory Visit (INDEPENDENT_AMBULATORY_CARE_PROVIDER_SITE_OTHER): Payer: Self-pay | Admitting: Internal Medicine

## 2012-07-21 ENCOUNTER — Emergency Department (HOSPITAL_COMMUNITY)
Admission: EM | Admit: 2012-07-21 | Discharge: 2012-07-21 | Disposition: A | Discharge status: Home / Self Care | Payer: Medicare Other | Attending: Emergency Medicine | Admitting: Emergency Medicine

## 2012-07-21 ENCOUNTER — Encounter (HOSPITAL_COMMUNITY): Payer: Self-pay | Admitting: *Deleted

## 2012-07-21 DIAGNOSIS — R300 Dysuria: Secondary | ICD-10-CM | POA: Insufficient documentation

## 2012-07-21 DIAGNOSIS — Z79899 Other long term (current) drug therapy: Secondary | ICD-10-CM | POA: Insufficient documentation

## 2012-07-21 DIAGNOSIS — E0590 Thyrotoxicosis, unspecified without thyrotoxic crisis or storm: Secondary | ICD-10-CM | POA: Insufficient documentation

## 2012-07-21 DIAGNOSIS — Z87448 Personal history of other diseases of urinary system: Secondary | ICD-10-CM | POA: Insufficient documentation

## 2012-07-21 DIAGNOSIS — K219 Gastro-esophageal reflux disease without esophagitis: Secondary | ICD-10-CM | POA: Insufficient documentation

## 2012-07-21 DIAGNOSIS — Z87891 Personal history of nicotine dependence: Secondary | ICD-10-CM | POA: Insufficient documentation

## 2012-07-21 DIAGNOSIS — N390 Urinary tract infection, site not specified: Secondary | ICD-10-CM | POA: Insufficient documentation

## 2012-07-21 DIAGNOSIS — Z8719 Personal history of other diseases of the digestive system: Secondary | ICD-10-CM | POA: Insufficient documentation

## 2012-07-21 DIAGNOSIS — Z8551 Personal history of malignant neoplasm of bladder: Secondary | ICD-10-CM | POA: Insufficient documentation

## 2012-07-21 DIAGNOSIS — Z8739 Personal history of other diseases of the musculoskeletal system and connective tissue: Secondary | ICD-10-CM | POA: Insufficient documentation

## 2012-07-21 DIAGNOSIS — R350 Frequency of micturition: Secondary | ICD-10-CM | POA: Insufficient documentation

## 2012-07-21 DIAGNOSIS — R443 Hallucinations, unspecified: Secondary | ICD-10-CM | POA: Insufficient documentation

## 2012-07-21 DIAGNOSIS — G309 Alzheimer's disease, unspecified: Secondary | ICD-10-CM | POA: Insufficient documentation

## 2012-07-21 DIAGNOSIS — F0280 Dementia in other diseases classified elsewhere without behavioral disturbance: Secondary | ICD-10-CM | POA: Insufficient documentation

## 2012-07-21 LAB — BASIC METABOLIC PANEL
BUN: 29 mg/dL — ABNORMAL HIGH (ref 6–23)
CO2: 25 mEq/L (ref 19–32)
Calcium: 9.8 mg/dL (ref 8.4–10.5)
Creatinine, Ser: 1.55 mg/dL — ABNORMAL HIGH (ref 0.50–1.35)
Glucose, Bld: 112 mg/dL — ABNORMAL HIGH (ref 70–99)

## 2012-07-21 LAB — CBC WITH DIFFERENTIAL/PLATELET
Eosinophils Relative: 4 % (ref 0–5)
HCT: 41.2 % (ref 39.0–52.0)
Lymphocytes Relative: 23 % (ref 12–46)
Lymphs Abs: 1.6 10*3/uL (ref 0.7–4.0)
MCV: 89.2 fL (ref 78.0–100.0)
Monocytes Absolute: 0.8 10*3/uL (ref 0.1–1.0)
RBC: 4.62 MIL/uL (ref 4.22–5.81)
RDW: 13.4 % (ref 11.5–15.5)
WBC: 7 10*3/uL (ref 4.0–10.5)

## 2012-07-21 LAB — URINE MICROSCOPIC-ADD ON

## 2012-07-21 LAB — URINALYSIS, ROUTINE W REFLEX MICROSCOPIC
Glucose, UA: NEGATIVE mg/dL
Ketones, ur: NEGATIVE mg/dL
Protein, ur: NEGATIVE mg/dL

## 2012-07-21 MED ORDER — DEXTROSE 5 % IV SOLN
1.0000 g | Freq: Once | INTRAVENOUS | Status: AC
  Administered 2012-07-21: 1 g via INTRAVENOUS
  Filled 2012-07-21: qty 10

## 2012-07-21 MED ORDER — SODIUM CHLORIDE 0.9 % IV SOLN: 1000.0000 mL | INTRAVENOUS | Status: DC

## 2012-07-21 MED ORDER — CEPHALEXIN 500 MG PO CAPS: 500.0000 mg | ORAL_CAPSULE | Freq: Four times a day (QID) | ORAL | Status: DC

## 2012-07-21 MED ORDER — SODIUM CHLORIDE 0.9 % IV SOLN: 1000.0000 mL | Freq: Once | INTRAVENOUS | Status: DC

## 2012-07-21 MED ORDER — SODIUM CHLORIDE 0.9 % IV SOLN
1000.0000 mL | Freq: Once | INTRAVENOUS | Status: AC
  Administered 2012-07-21: 1000 mL via INTRAVENOUS

## 2012-07-21 NOTE — ED Notes (Signed)
Dr Patria Mane states to not give second bolus as BP is now at a normal level.

## 2012-07-21 NOTE — ED Notes (Signed)
Pt states he had a cystoscopy yesterday, states today he is dizzy and has some confusion, also complains of hallucinations at times. Pt is currently AAx4, speaking clearly and concisely. NAD noted.

## 2012-07-21 NOTE — ED Notes (Signed)
Pt had a procedure done by Dr. Jerre Simon where they went through his penis. Pt now c/o dysuria and altered mental status. Pt states he sees something and looks away and its gone.

## 2012-07-21 NOTE — ED Provider Notes (Signed)
History     CSN: [id number]  Arrival date & time 07/21/12  1914   First MD Initiated Contact with Patient 07/21/12 1930      Chief Complaint  Patient presents with  . Altered Mental Status  . Dysuria     The history is provided by the patient, a relative and medical records.   patient underwent cystoscopy with several bladder biopsies 3 days ago.  He now presents with dysuria and urinary frequency as well as some new visual hallucinations.  His sons report that the patient has had several cystoscopies every time after cystoscopy the patient develops urinary tract infection and began to have visual hallucinations.  No fevers or chills.  No nausea vomiting or diarrhea.  The patient's keeping oral fluids down at home.  He denies abdominal pain.  No other complaints.  Symptoms are mild in severity.  The patient is able to express these visual hallucinations clearly.  He has good insight.  He knows these are hallucinations.  Past Medical History  Diagnosis Date  . Epigastric pain   . Chronic constipation   . GERD (gastroesophageal reflux disease)   . Bladder tumor 09  . Hoarseness   . Hyperthyroidism   . Alzheimer disease   . Shortness of breath   . Cancer     bladder tumor  . Arthritis     Past Surgical History  Procedure Laterality Date  . Upper gastrointestinal endoscopy  04/06/2010    EGD ED  . Hemorrhoid surgery    . Hand surgery      right  . Wrist fracture surgery      pins placed-left  . Cystoscopy with biopsy  01/09/2012    Procedure: CYSTOSCOPY WITH BIOPSY;  Surgeon: Ky Barban, MD;  Location: [facility];  Service: Urology;  Laterality: N/A;  Cystoscopy with Multiple Bladder Biopsies    History reviewed. No pertinent family history.  History  Substance Use Topics  . Smoking status: Former Smoker -- 1.00 packs/day for 4 years    Types: Cigarettes  . Smokeless tobacco: Former Neurosurgeon    Quit date: 01/03/1960  . Alcohol Use: No      Review of Systems   Genitourinary: Positive for dysuria.  Psychiatric/Behavioral: Positive for altered mental status.  All other systems reviewed and are negative.    Allergies  Escitalopram  Home Medications   Current Outpatient Rx  Name  Route  Sig  Dispense  Refill  . cholecalciferol (VITAMIN D) 1000 UNITS tablet   Oral   Take 1,000 Units by mouth daily.         . cyanocobalamin (,VITAMIN B-12,) 1000 MCG/ML injection   Intramuscular   Inject 1,000 mcg into the muscle every 30 (thirty) days.         [name] donepezil (ARICEPT) 10 MG tablet   Oral   Take 10 mg by mouth at bedtime.          [name] levothyroxine (SYNTHROID, LEVOTHROID) 125 MCG tablet   Oral   Take 125 mcg by mouth daily.         . memantine (NAMENDA) 10 MG tablet   Oral   Take 10 mg by mouth 2 (two) times daily.           . pantoprazole (PROTONIX) 40 MG tablet   Oral   Take 40 mg by mouth daily.         . polyethylene glycol (MIRALAX / GLYCOLAX) packet   Oral   Take 17  g by mouth daily.           . cephALEXin (KEFLEX) 500 MG capsule   Oral   Take 1 capsule (500 mg total) by mouth 4 (four) times daily.   28 capsule   0     BP 116/66  Pulse 63  Temp(Src) 99.7 F (37.6 C) (Rectal)  Resp 16  Ht 5\' 11"  (1.803 m)  Wt 240 lb (108.863 kg)  BMI 33.49 kg/m2  SpO2 97%  Physical Exam  Nursing note and vitals reviewed. Constitutional: He is oriented to person, place, and time. He appears well-developed and well-nourished.  HENT:  Head: Normocephalic and atraumatic.  Eyes: EOM are normal.  Neck: Normal range of motion.  Cardiovascular: Normal rate, regular rhythm, normal heart sounds and intact distal pulses.   Pulmonary/Chest: Effort normal and breath sounds normal. No respiratory distress.  Abdominal: Soft. He exhibits no distension. There is no tenderness.  Musculoskeletal: Normal range of motion.  Neurological: He is alert and oriented to person, place, and time.  Skin: Skin is warm and dry.   Psychiatric: He has a normal mood and affect. Judgment normal.    ED Course  Procedures (including critical care time)  Labs Reviewed  BASIC METABOLIC PANEL - Abnormal; Notable for the following:    Sodium 134 (*)    Glucose, Bld 112 (*)    BUN 29 (*)    Creatinine, Ser 1.55 (*)    GFR calc non Af Amer 42 (*)    GFR calc Af Amer 48 (*)    All other components within normal limits  URINALYSIS, ROUTINE W REFLEX MICROSCOPIC - Abnormal; Notable for the following:    APPearance HAZY (*)    Hgb urine dipstick SMALL (*)    Nitrite POSITIVE (*)    Leukocytes, UA SMALL (*)    All other components within normal limits  URINE MICROSCOPIC-ADD ON - Abnormal; Notable for the following:    Bacteria, UA MANY (*)    All other components within normal limits  URINE CULTURE  CULTURE, BLOOD (ROUTINE X 2)  CULTURE, BLOOD (ROUTINE X 2)  URINE CULTURE  LACTIC ACID, PLASMA  CBC WITH DIFFERENTIAL   No results found.   1. Urinary tract infection   2. Hallucinations       MDM  9:24 PM The patient is well-appearing.  His blood pressure came up to 160s systolic after 500 cc of fluid.  His lactic acid is normal.  Very well appearing.  Discharge home in good condition.  He is keeping oral fluids down.  He has a mild elevation in his BUN and creatinine from baseline.  This will need to be rechecked next week.  He will follow up closely with his urologist.  No signs of urosepsis at this time.  His sons understand the importance of close followup with the urologist and recheck of his kidney function.        Lyanne Co, MD 07/21/12 2127

## 2012-07-21 NOTE — ED Notes (Signed)
Pt drining PO fluids at this time

## 2012-07-22 LAB — URINE CULTURE

## 2012-07-23 LAB — URINE CULTURE

## 2012-07-24 ENCOUNTER — Telehealth (HOSPITAL_COMMUNITY): Payer: Self-pay | Admitting: Emergency Medicine

## 2012-07-26 LAB — CULTURE, BLOOD (ROUTINE X 2): Culture: NO GROWTH

## 2012-08-21 ENCOUNTER — Emergency Department (HOSPITAL_COMMUNITY)
Admission: EM | Admit: 2012-08-21 | Discharge: 2012-08-21 | Disposition: A | Discharge status: Home / Self Care | Payer: Medicare Other | Attending: Emergency Medicine | Admitting: Emergency Medicine

## 2012-08-21 ENCOUNTER — Encounter (HOSPITAL_COMMUNITY): Payer: Self-pay

## 2012-08-21 DIAGNOSIS — K219 Gastro-esophageal reflux disease without esophagitis: Secondary | ICD-10-CM | POA: Insufficient documentation

## 2012-08-21 DIAGNOSIS — R441 Visual hallucinations: Secondary | ICD-10-CM

## 2012-08-21 DIAGNOSIS — Z8719 Personal history of other diseases of the digestive system: Secondary | ICD-10-CM | POA: Insufficient documentation

## 2012-08-21 DIAGNOSIS — E0590 Thyrotoxicosis, unspecified without thyrotoxic crisis or storm: Secondary | ICD-10-CM | POA: Insufficient documentation

## 2012-08-21 DIAGNOSIS — F0390 Unspecified dementia without behavioral disturbance: Secondary | ICD-10-CM | POA: Insufficient documentation

## 2012-08-21 DIAGNOSIS — G309 Alzheimer's disease, unspecified: Secondary | ICD-10-CM | POA: Insufficient documentation

## 2012-08-21 DIAGNOSIS — H5316 Psychophysical visual disturbances: Secondary | ICD-10-CM | POA: Insufficient documentation

## 2012-08-21 DIAGNOSIS — Z87448 Personal history of other diseases of urinary system: Secondary | ICD-10-CM | POA: Insufficient documentation

## 2012-08-21 DIAGNOSIS — Z79899 Other long term (current) drug therapy: Secondary | ICD-10-CM

## 2012-08-21 DIAGNOSIS — Z87891 Personal history of nicotine dependence: Secondary | ICD-10-CM | POA: Insufficient documentation

## 2012-08-21 DIAGNOSIS — F0280 Dementia in other diseases classified elsewhere without behavioral disturbance: Secondary | ICD-10-CM | POA: Insufficient documentation

## 2012-08-21 DIAGNOSIS — Z8739 Personal history of other diseases of the musculoskeletal system and connective tissue: Secondary | ICD-10-CM | POA: Insufficient documentation

## 2012-08-21 LAB — URINALYSIS, ROUTINE W REFLEX MICROSCOPIC
Bilirubin Urine: NEGATIVE
Glucose, UA: NEGATIVE mg/dL
Hgb urine dipstick: NEGATIVE
Ketones, ur: NEGATIVE mg/dL
Leukocytes, UA: NEGATIVE
Nitrite: NEGATIVE
Protein, ur: NEGATIVE mg/dL
Specific Gravity, Urine: 1.02 (ref 1.005–1.030)
Urobilinogen, UA: 0.2 mg/dL (ref 0.0–1.0)
pH: 6 (ref 5.0–8.0)

## 2012-08-21 LAB — CBC WITH DIFFERENTIAL/PLATELET
Basophils Absolute: 0 10*3/uL (ref 0.0–0.1)
Basophils Relative: 1 % (ref 0–1)
Eosinophils Absolute: 0.3 10*3/uL (ref 0.0–0.7)
Eosinophils Relative: 4 % (ref 0–5)
HCT: 41.7 % (ref 39.0–52.0)
Hemoglobin: 13.8 g/dL (ref 13.0–17.0)
Lymphocytes Relative: 31 % (ref 12–46)
Lymphs Abs: 2.1 10*3/uL (ref 0.7–4.0)
MCH: 30 pg (ref 26.0–34.0)
MCHC: 33.1 g/dL (ref 30.0–36.0)
MCV: 90.7 fL (ref 78.0–100.0)
Monocytes Absolute: 0.7 10*3/uL (ref 0.1–1.0)
Monocytes Relative: 10 % (ref 3–12)
Neutro Abs: 3.7 10*3/uL (ref 1.7–7.7)
Neutrophils Relative %: 55 % (ref 43–77)
Platelets: 173 10*3/uL (ref 150–400)
RBC: 4.6 MIL/uL (ref 4.22–5.81)
RDW: 13.8 % (ref 11.5–15.5)
WBC: 6.8 10*3/uL (ref 4.0–10.5)

## 2012-08-21 LAB — BASIC METABOLIC PANEL
BUN: 20 mg/dL (ref 6–23)
CO2: 26 mEq/L (ref 19–32)
Calcium: 9.2 mg/dL (ref 8.4–10.5)
Chloride: 103 mEq/L (ref 96–112)
Creatinine, Ser: 1.4 mg/dL — ABNORMAL HIGH (ref 0.50–1.35)
GFR calc Af Amer: 55 mL/min — ABNORMAL LOW (ref >90)
GFR calc non Af Amer: 47 mL/min — ABNORMAL LOW (ref >90)
Glucose, Bld: 113 mg/dL — ABNORMAL HIGH (ref 70–99)
Potassium: 4.4 mEq/L (ref 3.5–5.1)
Sodium: 136 mEq/L (ref 135–145)

## 2012-08-21 NOTE — ED Notes (Signed)
Pt states he is seeing bugs and people. Son is here with pt and states pt has chronic UTI'S. States he has hallucinations when he has a UTI. Pt just finished Cipro. EDP at bedside

## 2012-08-21 NOTE — ED Provider Notes (Signed)
History    This chart was scribed for Raeford Razor, MD by Concha Se, ED Scribe and Bennett Scrape, ED Scribe. The patient was seen in room APA03/[facility] and the patient's care was started at 5:47 PM.   CSN: [id number]  Arrival date & time 08/21/12  1722  Level 5 Caveat- H/O Dementia and AMS  Chief Complaint  Patient presents with  . Hallucinations    The history is provided by the patient, a relative and medical records. No language interpreter was used.   HPI Comments: [name] [name] is a 79 y.o. male who presents to the Emergency Department with his son complaining of intermittent hallucinations described as seeing people and things that are not real for last 3-4 days.  Pt's son also reports that he has seen a difference in pt's thoughts and he seems more confused than normal. At baseline, pt is aware of person, place, time and context. Son states that currently the pt is only aware of person and place. He admits that the pt has experienced one episode of prior symtpoms when he was diagnosed with a bladder infection.  Pt son states that he has a prescription for Cipro filled on 08-15-12 for his symptoms by pt's PCP but has been unable to locate the bottle. Dr. Jerre Simon found cancer on pt's bladder 3 to 4 years ago with a non-specific procedure done 3 weeks ago and resulted in improvement of pt's bladder symptoms. Pt reports that he has been dizzy for 2 months which son clarifies is only with standing. He states that the pt has been seen for the dizziness with no improvement. Pt reports having chronic back pain but denies fever, chills, nausea, vomiting, diarrhea, weakness, cough, SOB, dysuria, frequent urination, heaturia, and any other pain. Pt also denies no recent falls. Pt is a former smoker but denies alcohol use.   Past Medical History  Diagnosis Date  . Epigastric pain   . Chronic constipation   . GERD (gastroesophageal reflux disease)   . Bladder tumor 09  . Hoarseness   .  Hyperthyroidism   . Alzheimer disease   . Shortness of breath   . Cancer     bladder tumor  . Arthritis     Past Surgical History  Procedure Laterality Date  . Upper gastrointestinal endoscopy  04/06/2010    EGD ED  . Hemorrhoid surgery    . Hand surgery      right  . Wrist fracture surgery      pins placed-left  . Cystoscopy with biopsy  01/09/2012    Procedure: CYSTOSCOPY WITH BIOPSY;  Surgeon: Ky Barban, MD;  Location: [facility];  Service: Urology;  Laterality: N/A;  Cystoscopy with Multiple Bladder Biopsies    No family history on file.  History  Substance Use Topics  . Smoking status: Former Smoker -- 1.00 packs/day for 4 years    Types: Cigarettes  . Smokeless tobacco: Former Neurosurgeon    Quit date: 01/03/1960  . Alcohol Use: No      Review of Systems  Unable to perform ROS: Dementia    Allergies  Escitalopram  Home Medications   Current Outpatient Rx  Name  Route  Sig  Dispense  Refill  . cephALEXin (KEFLEX) 500 MG capsule   Oral   Take 1 capsule (500 mg total) by mouth 4 (four) times daily.   28 capsule   0   . cholecalciferol (VITAMIN D) 1000 UNITS tablet   Oral   Take 1,000  Units by mouth daily.         . cyanocobalamin (,VITAMIN B-12,) 1000 MCG/ML injection   Intramuscular   Inject 1,000 mcg into the muscle every 30 (thirty) days.         [name] donepezil (ARICEPT) 10 MG tablet   Oral   Take 10 mg by mouth at bedtime.          [name] levothyroxine (SYNTHROID, LEVOTHROID) 125 MCG tablet   Oral   Take 125 mcg by mouth daily.         . memantine (NAMENDA) 10 MG tablet   Oral   Take 10 mg by mouth 2 (two) times daily.           . pantoprazole (PROTONIX) 40 MG tablet   Oral   Take 40 mg by mouth daily.         . polyethylene glycol (MIRALAX / GLYCOLAX) packet   Oral   Take 17 g by mouth daily.             BP 143/66  Pulse 63  Temp(Src) 97.2 F (36.2 C) (Oral)  Resp 18  Ht 5\' 11"  (1.803 m)  Wt 240 lb (108.863 kg)  BMI  33.49 kg/m2  SpO2 95%  Physical Exam  Nursing note and vitals reviewed. Constitutional: He is oriented to person, place, and time. He appears well-developed and well-nourished. No distress.  HENT:  Head: Normocephalic and atraumatic.  Mouth/Throat: Oropharynx is clear and moist.  Eyes: Conjunctivae and EOM are normal.  Neck: Neck supple. No tracheal deviation present.  Cardiovascular: Normal rate, regular rhythm and normal heart sounds.   Pulmonary/Chest: Effort normal and breath sounds normal. No respiratory distress.  Abdominal: Soft. Bowel sounds are normal. There is no tenderness.  Musculoskeletal: Normal range of motion. He exhibits no edema.  Neurological: He is alert and oriented to person, place, and time.  Disoriented to year and month. Rambling thought process.  Skin: Skin is warm and dry.  Psychiatric: He has a normal mood and affect. His behavior is normal.    ED Course  Procedures (including critical care time)  DIAGNOSTIC STUDIES: Oxygen Saturation is 95% on room air, adequate by my interpretation.    COORDINATION OF CARE: 5:59 PM Discussed ED treatment with pt and pt agrees blood work and urine sample.   Labs Reviewed  BASIC METABOLIC PANEL - Abnormal; Notable for the following:    Glucose, Bld 113 (*)    Creatinine, Ser 1.40 (*)    GFR calc non Af Amer 47 (*)    GFR calc Af Amer 55 (*)    All other components within normal limits  URINE CULTURE  URINALYSIS, ROUTINE W REFLEX MICROSCOPIC  CBC WITH DIFFERENTIAL   No results found.   1. Visual hallucinations   2. Polypharmacy       MDM  79 year old male with intermittent hallucinations. Suspect possibly related to inappropriate medication usage. Patient has 2 different prescriptions for Celexa. 1 for 20 mg daily in one for 40 mg daily. Patient has been taking both. When asking patient to review his medications he is unclear as to the indications for pretty much all of them there is some concern that he  may be taking them inappropriately as well. Patient lives by herself but family is closely involved. I feel patient is stable to be discharged at this time and son is in agreement. Recommended getting a pill organizer and help patient organizing his medications by family. Followup with primary  care physician to discuss this as well. Urinalysis is unremarkable. Return precautions discussed.      I personally preformed the services scribed in my presence. The recorded information has been reviewed is accurate. Raeford Razor, MD.    Raeford Razor, MD 08/25/12 [phone]

## 2012-08-21 NOTE — ED Notes (Addendum)
Pt has been "seeing things for 4 days", things will be there then disappear. See's people and bugs. Recently treated for UTI.   Stated a "group of people came in my house and took over, they wouldn't say anything and couldn't find out anything".  When I turn the lights on at night, they leave. Denies fever, no vomiting or diarrhea.  Low back pain for several weeks

## 2012-08-22 LAB — URINE CULTURE
Colony Count: NO GROWTH
Culture: NO GROWTH

## 2012-10-10 ENCOUNTER — Ambulatory Visit (INDEPENDENT_AMBULATORY_CARE_PROVIDER_SITE_OTHER): Payer: Medicare Other | Admitting: Otolaryngology

## 2012-10-10 DIAGNOSIS — R42 Dizziness and giddiness: Secondary | ICD-10-CM

## 2012-10-10 DIAGNOSIS — H903 Sensorineural hearing loss, bilateral: Secondary | ICD-10-CM

## 2012-11-06 ENCOUNTER — Encounter: Payer: Self-pay | Admitting: *Deleted

## 2012-11-06 ENCOUNTER — Encounter: Payer: Self-pay | Admitting: Cardiovascular Disease

## 2012-11-07 ENCOUNTER — Encounter: Payer: Self-pay | Admitting: Cardiovascular Disease

## 2012-11-07 ENCOUNTER — Ambulatory Visit (INDEPENDENT_AMBULATORY_CARE_PROVIDER_SITE_OTHER): Payer: Medicare Other | Admitting: Cardiovascular Disease

## 2012-11-07 VITALS — BP 82/52 | HR 59 | Ht 70.0 in | Wt 246.0 lb

## 2012-11-07 DIAGNOSIS — E785 Hyperlipidemia, unspecified: Secondary | ICD-10-CM | POA: Insufficient documentation

## 2012-11-07 DIAGNOSIS — R42 Dizziness and giddiness: Secondary | ICD-10-CM | POA: Insufficient documentation

## 2012-11-07 DIAGNOSIS — G4733 Obstructive sleep apnea (adult) (pediatric): Secondary | ICD-10-CM

## 2012-11-07 MED ORDER — MIDODRINE HCL 5 MG PO TABS: 5.0000 mg | ORAL_TABLET | Freq: Three times a day (TID) | ORAL | Status: DC

## 2012-11-07 NOTE — Patient Instructions (Addendum)
  We will see you back in follow up in one month with an extender and 3 months with Dr [name]  Dr [name] has ordered an echocardiogram  Your physician has requested that you have an echocardiogram. Echocardiography is a painless test that uses sound waves to create images of your heart. It provides your doctor with information about the size and shape of your heart and how well your heart's chambers and valves are working. This procedure takes approximately one hour. There are no restrictions for this procedure.   Start Midodrine 5 mg one tablet three times a day

## 2012-11-07 NOTE — Progress Notes (Signed)
11/07/2012 [name] [name]   06/26/1933  [id number]  Primary Physician [name], MD Primary Cardiologist: Runell Gess MD [name]   HPI:  The patient is a 79 year old, moderately overweight, widowed Caucasian male, father of 3 who I last saw a year ago. He has a history of hyperlipidemia intolerant to statin drugs and obstructive sleep apnea on CPAP occasionally. He has been asymptomatic since I last saw him. His last Myoview performed 2 years ago was nonischemic. For the last saw him he denies chest pain but does get some dyspnea. His major complaint is of dizziness which sounds orthostatic. He has seen an ENT doctor who has ruled out in her ear problems.     Current Outpatient Prescriptions  Medication Sig Dispense Refill  . B Complex-Folic Acid (B COMPLEX-VITAMIN B12 PO) Take 1 tablet by mouth daily.      . cholecalciferol (VITAMIN D) 1000 UNITS tablet Take 1,000 Units by mouth daily.      [name] donepezil (ARICEPT) 10 MG tablet Take 10 mg by mouth at bedtime. MEMORY      . escitalopram (LEXAPRO) 20 MG tablet Take 10 mg by mouth daily.       [name] levothyroxine (SYNTHROID, LEVOTHROID) 125 MCG tablet Take 125 mcg by mouth daily. THYROID      . memantine (NAMENDA) 10 MG tablet Take 10 mg by mouth 2 (two) times daily. MEMORY      . pantoprazole (PROTONIX) 40 MG tablet Take 40 mg by mouth every morning. ACID REFLUX/ GERD      . polyethylene glycol (MIRALAX / GLYCOLAX) packet Take 17 g by mouth daily.        . NON FORMULARY at bedtime. CPAP       No current facility-administered medications for this visit.    Allergies  Allergen Reactions  . Escitalopram     Reaction is unknown: states sinus drainage  . Statins     History   Social History  . Marital Status: Divorced    Spouse Name: N/A    Number of Children: N/A  . Years of Education: N/A   Occupational History  . Not on file.   Social History Main Topics  . Smoking status: Former Smoker -- 1.00  packs/day for 4 years    Types: Cigarettes  . Smokeless tobacco: Former Neurosurgeon    Quit date: 01/03/1960  . Alcohol Use: No  . Drug Use: No  . Sexually Active: Yes    Birth Control/ Protection: None   Other Topics Concern  . Not on file   Social History Narrative  . No narrative on file     Review of Systems: General: negative for chills, fever, night sweats or weight changes.  Cardiovascular: negative for chest pain, dyspnea on exertion, edema, orthopnea, palpitations, paroxysmal nocturnal dyspnea or shortness of breath Dermatological: negative for rash Respiratory: negative for cough or wheezing Urologic: negative for hematuria Abdominal: negative for nausea, vomiting, diarrhea, bright red blood per rectum, melena, or hematemesis Neurologic: negative for visual changes, syncope, or dizziness All other systems reviewed and are otherwise negative except as noted above.    Blood pressure 82/52, pulse 59, height 5\' 10"  (1.778 m), weight 246 lb (111.585 kg).  General appearance: alert and no distress Neck: no adenopathy, no carotid bruit, no JVD, supple, symmetrical, trachea midline and thyroid not enlarged, symmetric, no tenderness/mass/nodules Lungs: clear to auscultation bilaterally Heart: regular rate and rhythm, S1, S2 normal, no murmur, click, rub or gallop Extremities:  extremities normal, atraumatic, no cyanosis or edema  EKG sinus bradycardia at 59 without ST or T wave changes  ASSESSMENT AND PLAN:   Dizziness [name] [name] returns today because of primarily complaints of dizziness. These sound orthostatic. He is on no blood pressures that would lower his blood pressure and his blood pressure today is 82/52 no apparent reason. He has a history of normal renal function. I am going to begin him on Midrin 5 mg by mouth 3 times a day and have told him to liberalize his salt intake. He was given level provider back in one month at me back in 3 months. I'm also going to get a 2-D  echocardiogram.      Runell Gess MD [facility], [facility] 11/07/2012 12:14 PM

## 2012-11-07 NOTE — Assessment & Plan Note (Signed)
[name] [name] returns today because of primarily complaints of dizziness. These sound orthostatic. He is on no blood pressures that would lower his blood pressure and his blood pressure today is 82/52 no apparent reason. He has a history of normal renal function. I am going to begin him on Midrin 5 mg by mouth 3 times a day and have told him to liberalize his salt intake. He was given level provider back in one month at me back in 3 months. I'm also going to get a 2-D echocardiogram.

## 2012-11-14 ENCOUNTER — Ambulatory Visit (HOSPITAL_COMMUNITY)
Admission: RE | Admit: 2012-11-14 | Discharge: 2012-11-14 | Disposition: A | Discharge status: Home / Self Care | Payer: Medicare Other | Source: Ambulatory Visit | Attending: Cardiovascular Disease | Admitting: Cardiovascular Disease

## 2012-11-14 DIAGNOSIS — I059 Rheumatic mitral valve disease, unspecified: Secondary | ICD-10-CM | POA: Insufficient documentation

## 2012-11-14 DIAGNOSIS — E785 Hyperlipidemia, unspecified: Secondary | ICD-10-CM | POA: Insufficient documentation

## 2012-11-14 DIAGNOSIS — I079 Rheumatic tricuspid valve disease, unspecified: Secondary | ICD-10-CM | POA: Insufficient documentation

## 2012-11-14 DIAGNOSIS — R42 Dizziness and giddiness: Secondary | ICD-10-CM | POA: Insufficient documentation

## 2012-11-14 DIAGNOSIS — R0602 Shortness of breath: Secondary | ICD-10-CM

## 2012-11-14 HISTORY — PX: TRANSTHORACIC ECHOCARDIOGRAM: SHX275

## 2012-11-14 NOTE — Progress Notes (Signed)
2D Echo Performed 11/14/2012    Quintarius Ferns, RCS  

## 2012-11-15 ENCOUNTER — Encounter: Payer: Self-pay | Admitting: *Deleted

## 2012-11-22 ENCOUNTER — Telehealth: Payer: Self-pay | Admitting: Cardiovascular Disease

## 2012-11-22 NOTE — Telephone Encounter (Signed)
Returned call.  Pt stated he needs the results from when he saw [name].  Asked pt if he was referring to the echocardiogram.  Pt stated he was.  Asked pt if letter received that was mailed last week.  Pt stated he has some mail and asked what did the envelope look like.  Pt informed it has "[place]" on the envelope.  Pt stated he has it.  RN read results to pt.  Pt verbalized understanding and agreed w/ plan.

## 2012-11-22 NOTE — Telephone Encounter (Signed)
Had Echo  11/14/12   Needs results

## 2012-12-10 ENCOUNTER — Encounter: Payer: Self-pay | Admitting: Physician Assistant

## 2012-12-10 ENCOUNTER — Ambulatory Visit (INDEPENDENT_AMBULATORY_CARE_PROVIDER_SITE_OTHER): Payer: Medicare Other | Admitting: Physician Assistant

## 2012-12-10 VITALS — BP 98/62 | HR 64 | Ht 71.0 in | Wt 243.5 lb

## 2012-12-10 DIAGNOSIS — R42 Dizziness and giddiness: Secondary | ICD-10-CM

## 2012-12-10 MED ORDER — MIDODRINE HCL 5 MG PO TABS: 10.0000 mg | ORAL_TABLET | Freq: Three times a day (TID) | ORAL | Status: DC

## 2012-12-10 NOTE — Progress Notes (Signed)
Date:  12/11/2012   ID:  [name] [name], DOB Jul 26, 1933, MRN [id number]  PCP:  [name], MD  Primary Cardiologist:  [name]     History of Present Illness: [name] [name] is a 79 y.o. male who is overweight, widowed, father of 3 who last saw [name] on 11/07/12. He has a history of hyperlipidemia(intolerant to statin drugs) and obstructive sleep apnea on CPAP occasionally.  His last Myoview performed 2 years ago was nonischemic.  2-D echocardiogram 11/14/2012 revealed a normal wall motion and ejection fraction of 55-60%. He has grade 1 diastolic dysfunction.    [name] suspected he had orthostatic hypotension and started him on Midodrin  He presented for a follow up evaluation and noticed some improvement mostly in the first week.  He still reports some dizziness.  He otherwise denies nausea, vomiting, fever, chest pain, shortness of breath, orthopnea, PND, cough, congestion, abdominal pain, hematochezia, melena, lower extremity edema, claudication.  Wt Readings from Last 3 Encounters:  12/10/12 243 lb 8 oz (110.451 kg)  11/07/12 246 lb (111.585 kg)  08/21/12 240 lb (108.863 kg)     Past Medical History  Diagnosis Date  . Epigastric pain   . Chronic constipation   . GERD (gastroesophageal reflux disease)   . Bladder tumor 09  . Hoarseness   . Hyperthyroidism   . Alzheimer disease   . Shortness of breath   . Cancer     bladder tumor  . Arthritis   . Hyperlipidemia   . OSA on CPAP   . History of nuclear stress test 08/2009    negative for ischemia   . Dizziness     Current Outpatient Prescriptions  Medication Sig Dispense Refill  . cholecalciferol (VITAMIN D) 1000 UNITS tablet Take 1,000 Units by mouth daily.      . Cyanocobalamin 1000 MCG TBCR Take 5,000 mcg by mouth daily.      [name] donepezil (ARICEPT) 10 MG tablet Take 10 mg by mouth at bedtime. MEMORY      . escitalopram (LEXAPRO) 20 MG tablet Take 10 mg by mouth daily.       [name] levothyroxine (SYNTHROID,  LEVOTHROID) 125 MCG tablet Take 125 mcg by mouth daily. THYROID      . memantine (NAMENDA) 10 MG tablet Take 10 mg by mouth 2 (two) times daily. MEMORY      . midodrine (PROAMATINE) 5 MG tablet Take 2 tablets (10 mg total) by mouth 3 (three) times daily.  90 tablet  3  . NON FORMULARY at bedtime. CPAP      . pantoprazole (PROTONIX) 40 MG tablet Take 40 mg by mouth every morning. ACID REFLUX/ GERD      . polyethylene glycol (MIRALAX / GLYCOLAX) packet Take 17 g by mouth daily.         No current facility-administered medications for this visit.    Allergies:    Allergies  Allergen Reactions  . Escitalopram     Reaction is unknown: states sinus drainage  . Statins     Social History:  The patient  reports that he has quit smoking. His smoking use included Cigarettes. He has a 4 pack-year smoking history. He quit smokeless tobacco use about 52 years ago. He reports that he does not drink alcohol or use illicit drugs.   Family history:  History reviewed. No pertinent family history.  ROS:  Please see the history of present illness.  All other systems reviewed and negative.  PHYSICAL EXAM: VS:  BP 98/62  Pulse 64  Ht 5\' 11"  (1.803 m)  Wt 243 lb 8 oz (110.451 kg)  BMI 33.98 kg/m2 Well nourished, well developed, in no acute distress HEENT: Pupils are equal round react to light accommodation extraocular movements are intact.  Neck: no JVDNo cervical lymphadenopathy. Cardiac: Regular rate and rhythm without murmurs rubs or gallops. Lungs:  clear to auscultation bilaterally, no wheezing, rhonchi or rales Abd: soft, nontender, positive bowel sounds all quadrants, no hepatosplenomegaly Ext: no lower extremity edema.  2+ radial and dorsalis pedis pulses. Skin: warm and dry Neuro:  Grossly normal    ASSESSMENT AND PLAN:  Problem List Items Addressed This Visit   Dizziness - Primary     The patient does report some mild improvement in dizziness since starting the midodrine.  The  biggest improvement was in the first week.  Rechecked his BP in the office and it was 112/75 lying and dropped into the 90's systolic when I sat him up.  He reported a little dizziness when I sat him up.  He was not orthostatic when the RN checked although he was mildly hypotensive.  I increased the midodrine to 10mg  TID.  He will follow up in a month or sooner if needed.    Relevant Medications      midodrine (PROAMATINE) tablet

## 2012-12-10 NOTE — Patient Instructions (Signed)
After increasing the dose of midodrine, you continued to have symptoms. call our office.  Follow up in a month.

## 2012-12-11 NOTE — Assessment & Plan Note (Signed)
The patient does report some mild improvement in dizziness since starting the midodrine.  The biggest improvement was in the first week.  Rechecked his BP in the office and it was 112/75 lying and dropped into the 90's systolic when I sat him up.  He reported a little dizziness when I sat him up.  He was not orthostatic when the RN checked although he was mildly hypotensive.  I increased the midodrine to 10mg  TID.  He will follow up in a month or sooner if needed.

## 2012-12-12 ENCOUNTER — Encounter (INDEPENDENT_AMBULATORY_CARE_PROVIDER_SITE_OTHER): Payer: Self-pay | Admitting: *Deleted

## 2012-12-12 ENCOUNTER — Other Ambulatory Visit (INDEPENDENT_AMBULATORY_CARE_PROVIDER_SITE_OTHER): Payer: Self-pay | Admitting: *Deleted

## 2012-12-12 ENCOUNTER — Encounter (INDEPENDENT_AMBULATORY_CARE_PROVIDER_SITE_OTHER): Payer: Self-pay | Admitting: Internal Medicine

## 2012-12-12 ENCOUNTER — Ambulatory Visit (INDEPENDENT_AMBULATORY_CARE_PROVIDER_SITE_OTHER): Payer: Medicare Other | Admitting: Internal Medicine

## 2012-12-12 VITALS — BP 110/68 | HR 60 | Temp 98.6°F | Ht 71.0 in | Wt 241.4 lb

## 2012-12-12 DIAGNOSIS — R1310 Dysphagia, unspecified: Secondary | ICD-10-CM

## 2012-12-12 NOTE — Progress Notes (Signed)
Subjective:     Patient ID: [name] [name], male   DOB: 06/25/1933, 79 y.o.   MRN: [id number]  HPI  [name] [name] presents today with c/o of dysphagia.  He tells me foods are lodging in his esophagus. Chicken will lodge and he will have to re chew his food. He has to continuous swallow for the food to go down. Dry foods and chicken will lodge in his esophagus. It takes about an hour for him to eat breakfast. He had symptoms for about 3 months. Hx of dysphagia.   He underwent an EGD/ED in December of 2011. He says he  strangles easily. Appetite is good. He may have lost a couple of pounds in the past couple of weeks. No abdominal pain. He usually has a BM daily. No melena or bright red rectal bleeding. Please note, patient becomes dizzy when he stands to an upright position.  04/06/2010 EGD/ED: Mild changes of reflux esophagitis limited to GE junction and a small sliding hiatal hernia, otherwise normal EGD. Esophagus dilated by passing a 56 [place] Maloney dilator.  Review of Systems Current Outpatient Prescriptions  Medication Sig Dispense Refill  . cholecalciferol (VITAMIN D) 1000 UNITS tablet Take 1,000 Units by mouth daily.      . Cyanocobalamin 1000 MCG TBCR Take 5,000 mcg by mouth daily.      [name] escitalopram (LEXAPRO) 20 MG tablet Take 10 mg by mouth daily.       [name] levothyroxine (SYNTHROID, LEVOTHROID) 125 MCG tablet Take 125 mcg by mouth daily. THYROID      . memantine (NAMENDA) 10 MG tablet Take 10 mg by mouth 2 (two) times daily. MEMORY      . midodrine (PROAMATINE) 5 MG tablet Take 2 tablets (10 mg total) by mouth 3 (three) times daily.  90 tablet  3  . pantoprazole (PROTONIX) 40 MG tablet Take 40 mg by mouth every morning. ACID REFLUX/ GERD      . polyethylene glycol (MIRALAX / GLYCOLAX) packet Take 17 g by mouth daily.        [name] donepezil (ARICEPT) 10 MG tablet Take 10 mg by mouth at bedtime. MEMORY      . NON FORMULARY at bedtime. CPAP       No current facility-administered medications for  this visit.   Past Medical History  Diagnosis Date  . Epigastric pain   . Chronic constipation   . GERD (gastroesophageal reflux disease)   . Bladder tumor 09  . Hoarseness   . Hyperthyroidism   . Alzheimer disease   . Shortness of breath   . Cancer     bladder tumor  . Arthritis   . Hyperlipidemia   . OSA on CPAP   . History of nuclear stress test 08/2009    negative for ischemia   . Dizziness    Past Surgical History  Procedure Laterality Date  . Upper gastrointestinal endoscopy  04/06/2010    EGD ED  . Hemorrhoid surgery    . Hand surgery      right  . Wrist fracture surgery      pins placed-left  . Cystoscopy with biopsy  01/09/2012    Procedure: CYSTOSCOPY WITH BIOPSY;  Surgeon: Ky Barban, MD;  Location: [facility];  Service: Urology;  Laterality: N/A;  Cystoscopy with Multiple Bladder Biopsies   Allergies  Allergen Reactions  . Escitalopram     Reaction is unknown: states sinus drainage  . Statins        Objective:  Physical Exam  Filed Vitals:   12/12/12 1443  BP: 110/68  Pulse: 60  Temp: 98.6 F (37 C)  Height: 5\' 11"  (1.803 m)  Weight: 241 lb 6.4 oz (109.498 kg)  Alert and oriented. Skin warm and dry. Oral mucosa is moist.   . Sclera anicteric, conjunctivae is pink. Thyroid not enlarged. No cervical lymphadenopathy. Lungs clear. Heart regular rate and rhythm.  Abdomen is soft. Bowel sounds are positive. No hepatomegaly. No abdominal masses felt. No tenderness.  No edema to lower extremities.        Assessment:   Solid food dysphagia. Hx of dysphagia and underwent an EGD in 2011.    Plan:     EGD/ED with Dr. Karilyn Cota.   The risks and benefits such as perforation, bleeding, and infection were reviewed with the patient and is agreeable.

## 2012-12-12 NOTE — Patient Instructions (Addendum)
EGD/ED. The risks and benefits such as perforation, bleeding, and infection were reviewed with the patient and is agreeable. 

## 2012-12-25 ENCOUNTER — Encounter (HOSPITAL_COMMUNITY): Payer: Self-pay | Admitting: Pharmacy Technician

## 2012-12-26 ENCOUNTER — Telehealth: Payer: Self-pay | Admitting: Physician Assistant

## 2012-12-26 NOTE — Telephone Encounter (Signed)
[facility] has a question about the dosage of [name] [name]'s Midodrine 5 mg.

## 2012-12-26 NOTE — Telephone Encounter (Signed)
Verified w/pharmacist Midodrine was increased to 10mg  tid.  Rx sent in as 5mg  take 2 tabs tid and only #90 given, patient under the impression he would only be taking 1 tab tid.  Rx changed to 10mg  tid #90.

## 2013-01-08 ENCOUNTER — Encounter (HOSPITAL_COMMUNITY)
Admission: RE | Disposition: A | Discharge status: Home / Self Care | Payer: Self-pay | Source: Ambulatory Visit | Attending: Internal Medicine

## 2013-01-08 ENCOUNTER — Encounter (HOSPITAL_COMMUNITY): Payer: Self-pay | Admitting: *Deleted

## 2013-01-08 ENCOUNTER — Ambulatory Visit (HOSPITAL_COMMUNITY)
Admission: RE | Admit: 2013-01-08 | Discharge: 2013-01-08 | Disposition: A | Discharge status: Home / Self Care | Payer: Medicare Other | Source: Ambulatory Visit | Attending: Internal Medicine | Admitting: Internal Medicine

## 2013-01-08 DIAGNOSIS — K2960 Other gastritis without bleeding: Secondary | ICD-10-CM | POA: Insufficient documentation

## 2013-01-08 DIAGNOSIS — K219 Gastro-esophageal reflux disease without esophagitis: Secondary | ICD-10-CM

## 2013-01-08 DIAGNOSIS — R1310 Dysphagia, unspecified: Secondary | ICD-10-CM | POA: Insufficient documentation

## 2013-01-08 DIAGNOSIS — K449 Diaphragmatic hernia without obstruction or gangrene: Secondary | ICD-10-CM

## 2013-01-08 HISTORY — PX: ESOPHAGOGASTRODUODENOSCOPY (EGD) WITH ESOPHAGEAL DILATION: SHX5812

## 2013-01-08 SURGERY — ESOPHAGOGASTRODUODENOSCOPY (EGD) WITH ESOPHAGEAL DILATION
Anesthesia: Moderate Sedation

## 2013-01-08 MED ORDER — BUTAMBEN-TETRACAINE-BENZOCAINE 2-2-14 % EX AERO
INHALATION_SPRAY | CUTANEOUS | Status: DC | PRN
  Administered 2013-01-08: 2 via TOPICAL

## 2013-01-08 MED ORDER — MEPERIDINE HCL 25 MG/ML IJ SOLN
INTRAMUSCULAR | Status: DC | PRN
  Administered 2013-01-08: 25 mg via INTRAVENOUS

## 2013-01-08 MED ORDER — SODIUM CHLORIDE 0.9 % IV SOLN
INTRAVENOUS | Status: DC
  Administered 2013-01-08: 13:00:00 via INTRAVENOUS

## 2013-01-08 MED ORDER — MIDAZOLAM HCL 5 MG/5ML IJ SOLN
INTRAMUSCULAR | Status: DC | PRN
  Administered 2013-01-08: 1 mg via INTRAVENOUS
  Administered 2013-01-08: 2 mg via INTRAVENOUS
  Administered 2013-01-08: 1 mg via INTRAVENOUS

## 2013-01-08 MED ORDER — STERILE WATER FOR IRRIGATION IR SOLN
Status: DC | PRN
  Administered 2013-01-08: 14:00:00

## 2013-01-08 MED ORDER — MIDAZOLAM HCL 5 MG/5ML IJ SOLN
INTRAMUSCULAR | Status: AC
  Filled 2013-01-08: qty 10

## 2013-01-08 MED ORDER — MEPERIDINE HCL 50 MG/ML IJ SOLN
INTRAMUSCULAR | Status: AC
  Filled 2013-01-08: qty 1

## 2013-01-08 NOTE — Op Note (Signed)
EGD PROCEDURE REPORT  PATIENT:  [name] [name]  MR#:  [id number] Birthdate:  19-Sep-1933, 79 y.o., male Endoscopist:  Dr. Malissa Hippo, MD Referred By:  Dr. [name], MD  Procedure Date: 01/08/2013  Procedure:   EGD with ED.  Indications:  Patient is 15 old Caucasian male with chronic GERD who presents with dysphagia to solids. Patient states his heartburn is well controlled with pantoprazole. Last esophageal dilation was in December 2011.            Informed Consent:  The risks, benefits, alternatives & imponderables which include, but are not limited to, bleeding, infection, perforation, drug reaction and potential missed lesion have been reviewed.  The potential for biopsy, lesion removal, esophageal dilation, etc. have also been discussed.  Questions have been answered.  All parties agreeable.  Please see history & physical in medical record for more information.  Medications:  Demerol 25 mg IV Versed 4 mg IV Cetacaine spray topically for oropharyngeal anesthesia  Description of procedure:  The endoscope was introduced through the mouth and advanced to the second portion of the duodenum without difficulty or limitations. The mucosal surfaces were surveyed very carefully during advancement of the scope and upon withdrawal.  Findings:  Esophagus:  Mucosa of the esophagus was normal. Focal erythema noted at GE junction. No ring or stricture noted. GEJ:  42 cm Hiatus:  44 cm Stomach:  Stomach was empty and distended very well with insufflation. Folds in the proximal stomach were normal. Examination mucosa revealed single large antral erosion along with few areas of focal erythema and edema. Pyloric channel was patent. Angularis fundus and cardia were examined by retroflex in the scope and were normal. Duodenum:  Normal bulbar and post bulbar mucosa.  Therapeutic/Diagnostic Maneuvers Performed:  Esophagus dilated by passing 56 and 58 French Maloney dilators to full insertion.  Some resistance noted at 45 cm from the incisors on passing both dilators. Esophageal mucosa was reexamined post dilation and no mucosal disruption noted.  Complications:  None  Impression: No evidence of erosive esophagitis ring or stricture. Small sliding hiatal hernia. Erosive antral gastritis. Esophagus dilated by passing 56 and 58 French Maloney dilators but no mucosal destruction induced.  Recommendations:  H. pylori serology. If patient remains for dysphasia will consider esophageal manometry.  Cheron Coryell U  01/08/2013  2:32 PM  CC: Dr. Phillips Odor, Chancy Hurter, MD & Dr. Bonnetta Barry ref. provider found

## 2013-01-08 NOTE — H&P (Signed)
[name] [name] is an 79 y.o. [name].   Chief Complaint: Patient's here for EGD and ED. HPI: Patient is [name] [name] with multiple medical problems including chronic GERD who presents with a few months history of dysphagia primarily to solids. He has had dysphagia for years possibly multifactorial. He says it has gotten worse over the last 3-4 months he has difficulty primarily with solids. He is able to force food down by drinking fluids. He states his heartburn is well controlled with PPI. He denies abdominal pain or melena. Last EGD/ED was in December, 2011.  Past Medical History  Diagnosis Date  . Epigastric pain   . Chronic constipation   . GERD (gastroesophageal reflux disease)   . Bladder tumor 09  . Hoarseness   . Hyperthyroidism   . Alzheimer disease   . Shortness of breath   . Cancer     bladder tumor  . Arthritis   . Hyperlipidemia   . OSA on CPAP   . History of nuclear stress test 08/2009    negative for ischemia   . Dizziness   . ZOXWRUEA(540.9)     Past Surgical History  Procedure Laterality Date  . Upper gastrointestinal endoscopy  04/06/2010    EGD ED  . Hemorrhoid surgery    . Hand surgery      right  . Wrist fracture surgery      pins placed-left  . Cystoscopy with biopsy  01/09/2012    Procedure: CYSTOSCOPY WITH BIOPSY;  Surgeon: Ky Barban, MD;  Location: [facility];  Service: Urology;  Laterality: N/A;  Cystoscopy with Multiple Bladder Biopsies    No family history on file. Social History:  reports that he has quit smoking. His smoking use included Cigarettes. He has a 4 pack-year smoking history. He quit smokeless tobacco use about 53 years ago. He reports that he does not drink alcohol or use illicit drugs.  Allergies:  Allergies  Allergen Reactions  . Escitalopram     Reaction is unknown: states sinus drainage. ( Patient is presently taking this medication)  . Statins     Medications Prior to Admission  Medication Sig Dispense  Refill  . cholecalciferol (VITAMIN D) 1000 UNITS tablet Take 1,000 Units by mouth daily.      [name] donepezil (ARICEPT) 10 MG tablet Take 10 mg by mouth at bedtime. MEMORY      . escitalopram (LEXAPRO) 20 MG tablet Take 10 mg by mouth daily.       [name] levothyroxine (SYNTHROID, LEVOTHROID) 125 MCG tablet Take 125 mcg by mouth daily. THYROID      . memantine (NAMENDA) 10 MG tablet Take 10 mg by mouth 2 (two) times daily. MEMORY      . midodrine (PROAMATINE) 5 MG tablet Take 2 tablets (10 mg total) by mouth 3 (three) times daily.  90 tablet  3  . pantoprazole (PROTONIX) 40 MG tablet Take 40 mg by mouth every morning. ACID REFLUX/ GERD      . vitamin B-12 (CYANOCOBALAMIN) 1000 MCG tablet Take 1,000 mcg by mouth daily.      . polyethylene glycol (MIRALAX / GLYCOLAX) packet Take 17 g by mouth daily.          No results found for this or any previous visit (from the past 48 hour(s)). No results found.  ROS  Blood pressure 165/84, pulse 51, temperature 97.6 F (36.4 C), temperature source Oral, resp. rate 24, height 5\' 11"  (1.803 m), weight 241 lb (109.317 kg),  SpO2 93.00%. Physical Exam  Constitutional: He appears well-developed and well-nourished.  HENT:  Mouth/Throat: Oropharynx is clear and moist.  Eyes: Conjunctivae are normal. No scleral icterus.  Neck: No thyromegaly present.  Cardiovascular: Normal rate, regular rhythm and normal heart sounds.   No murmur heard. Respiratory: Effort normal and breath sounds normal.  GI:  Protuberant but soft abdomen without tenderness organomegaly or masses.  Musculoskeletal: He exhibits no edema.  Lymphadenopathy:    He has no cervical adenopathy.  Neurological: He is alert.  Skin: Skin is warm and dry.     Assessment/Plan Solid food dysphagia. Chronic GERD. EGD/ED.  Griselda Tosh U 01/08/2013, 2:06 PM

## 2013-01-10 ENCOUNTER — Ambulatory Visit (INDEPENDENT_AMBULATORY_CARE_PROVIDER_SITE_OTHER): Payer: Medicare Other | Admitting: Cardiovascular Disease

## 2013-01-10 ENCOUNTER — Encounter: Payer: Self-pay | Admitting: Cardiovascular Disease

## 2013-01-10 VITALS — BP 84/64 | HR 52 | Ht 70.5 in | Wt 249.0 lb

## 2013-01-10 DIAGNOSIS — R42 Dizziness and giddiness: Secondary | ICD-10-CM

## 2013-01-10 NOTE — Patient Instructions (Addendum)
Your physician wants you to follow-up in: 1 year with Dr Berry. You will receive a reminder letter in the mail two months in advance. If you don't receive a letter, please call our office to schedule the follow-up appointment.  

## 2013-01-10 NOTE — Progress Notes (Signed)
01/10/2013 [name] [name]   Jan 22, 1934  [id number]  Primary Physician [name], MD Primary Cardiologist: Runell Gess MD [name]   HPI:  The patient is a 79 year old, moderately overweight, widowed Caucasian male, father of 3 who I last saw a year ago. He has a history of hyperlipidemia intolerant to statin drugs and obstructive sleep apnea on CPAP occasionally. He has been asymptomatic since I last saw him. His last Myoview performed 2 years ago was nonischemic. For the last saw him he denies chest pain but does get some dyspnea. His major complaint is of dizziness which sounds orthostatic. He has seen an ENT doctor who has ruled out in her ear problems. I have begun him on Midodrine on 11/07/12 because of hypotension thinking that this may be contributory to his dizziness however he did not notice any improvement and therefore I thought advised him to stop this medication     Current Outpatient Prescriptions  Medication Sig Dispense Refill  . cholecalciferol (VITAMIN D) 1000 UNITS tablet Take 1,000 Units by mouth daily.      [name] escitalopram (LEXAPRO) 20 MG tablet Take 10 mg by mouth daily.       [name] levothyroxine (SYNTHROID, LEVOTHROID) 125 MCG tablet Take 125 mcg by mouth daily. THYROID      . midodrine (PROAMATINE) 5 MG tablet Take 2 tablets (10 mg total) by mouth 3 (three) times daily.  90 tablet  3  . pantoprazole (PROTONIX) 40 MG tablet Take 40 mg by mouth every morning. ACID REFLUX/ GERD      . polyethylene glycol (MIRALAX / GLYCOLAX) packet Take 17 g by mouth daily.        . vitamin B-12 (CYANOCOBALAMIN) 1000 MCG tablet Take 1,000 mcg by mouth daily.      . memantine (NAMENDA) 10 MG tablet Take 10 mg by mouth 2 (two) times daily. MEMORY       No current facility-administered medications for this visit.    Allergies  Allergen Reactions  . Escitalopram     Reaction is unknown: states sinus drainage. ( Patient is presently taking this medication)    . Statins     History   Social History  . Marital Status: Widowed    Spouse Name: N/A    Number of Children: N/A  . Years of Education: N/A   Occupational History  . Not on file.   Social History Main Topics  . Smoking status: Former Smoker -- 1.00 packs/day for 4 years    Types: Cigarettes  . Smokeless tobacco: Former Neurosurgeon    Quit date: 01/03/1960  . Alcohol Use: No  . Drug Use: No  . Sexual Activity: Yes    Birth Control/ Protection: None   Other Topics Concern  . Not on file   Social History Narrative  . No narrative on file     Review of Systems: General: negative for chills, fever, night sweats or weight changes.  Cardiovascular: negative for chest pain, dyspnea on exertion, edema, orthopnea, palpitations, paroxysmal nocturnal dyspnea or shortness of breath Dermatological: negative for rash Respiratory: negative for cough or wheezing Urologic: negative for hematuria Abdominal: negative for nausea, vomiting, diarrhea, bright red blood per rectum, melena, or hematemesis Neurologic: negative for visual changes, syncope, or dizziness All other systems reviewed and are otherwise negative except as noted above.    Blood pressure 84/64, pulse 52, height 5' 10.5" (1.791 m), weight 249 lb (112.946 kg).  General appearance: alert and no  distress Neck: no adenopathy, no carotid bruit, no JVD, supple, symmetrical, trachea midline and thyroid not enlarged, symmetric, no tenderness/mass/nodules Lungs: clear to auscultation bilaterally Heart: regular rate and rhythm, S1, S2 normal, no murmur, click, rub or gallop Extremities: extremities normal, atraumatic, no cyanosis or edema  EKG not performed today  ASSESSMENT AND PLAN:   Dizziness I had started him on Midodrine when I saw him last in the office 11/07/12 because of hypotension however this did not improve his symptoms.I have advised him to stop the medication.      Runell Gess MD FACP,FACC,FAHA,  [facility] 01/10/2013 11:51 AM

## 2013-01-10 NOTE — Assessment & Plan Note (Signed)
I had started him on Midodrine when I saw him last in the office 11/07/12 because of hypotension however this did not improve his symptoms.I have advised him to stop the medication.

## 2013-01-13 ENCOUNTER — Encounter (HOSPITAL_COMMUNITY): Payer: Self-pay | Admitting: Internal Medicine

## 2013-03-03 ENCOUNTER — Other Ambulatory Visit (INDEPENDENT_AMBULATORY_CARE_PROVIDER_SITE_OTHER): Payer: Self-pay | Admitting: Internal Medicine

## 2013-07-09 ENCOUNTER — Encounter (INDEPENDENT_AMBULATORY_CARE_PROVIDER_SITE_OTHER): Payer: Self-pay | Admitting: *Deleted

## 2013-07-29 ENCOUNTER — Ambulatory Visit (INDEPENDENT_AMBULATORY_CARE_PROVIDER_SITE_OTHER): Payer: Medicare Other | Admitting: Internal Medicine

## 2013-10-27 ENCOUNTER — Other Ambulatory Visit (HOSPITAL_COMMUNITY): Payer: Self-pay | Admitting: Family Medicine

## 2013-10-27 DIAGNOSIS — R51 Headache: Secondary | ICD-10-CM

## 2013-10-29 ENCOUNTER — Ambulatory Visit (HOSPITAL_COMMUNITY)
Admission: RE | Admit: 2013-10-29 | Discharge: 2013-10-29 | Disposition: A | Discharge status: Home / Self Care | Payer: Medicare Other | Source: Ambulatory Visit | Attending: Family Medicine | Admitting: Family Medicine

## 2013-10-29 DIAGNOSIS — R42 Dizziness and giddiness: Secondary | ICD-10-CM | POA: Insufficient documentation

## 2013-10-29 DIAGNOSIS — R51 Headache: Secondary | ICD-10-CM

## 2013-10-29 DIAGNOSIS — F29 Unspecified psychosis not due to a substance or known physiological condition: Secondary | ICD-10-CM | POA: Insufficient documentation

## 2013-11-19 ENCOUNTER — Telehealth: Payer: Self-pay | Admitting: *Deleted

## 2013-11-19 ENCOUNTER — Telehealth: Payer: Self-pay | Admitting: Neurology

## 2013-11-19 ENCOUNTER — Ambulatory Visit (INDEPENDENT_AMBULATORY_CARE_PROVIDER_SITE_OTHER): Payer: Medicare Other | Admitting: Neurology

## 2013-11-19 ENCOUNTER — Encounter: Payer: Self-pay | Admitting: Neurology

## 2013-11-19 VITALS — BP 99/65 | HR 60 | Resp 17 | Ht 71.0 in | Wt 250.0 lb

## 2013-11-19 DIAGNOSIS — I70219 Atherosclerosis of native arteries of extremities with intermittent claudication, unspecified extremity: Secondary | ICD-10-CM

## 2013-11-19 DIAGNOSIS — G4730 Sleep apnea, unspecified: Secondary | ICD-10-CM

## 2013-11-19 DIAGNOSIS — R42 Dizziness and giddiness: Secondary | ICD-10-CM

## 2013-11-19 DIAGNOSIS — I951 Orthostatic hypotension: Secondary | ICD-10-CM

## 2013-11-19 DIAGNOSIS — A35 Other tetanus: Secondary | ICD-10-CM

## 2013-11-19 DIAGNOSIS — R0902 Hypoxemia: Secondary | ICD-10-CM | POA: Insufficient documentation

## 2013-11-19 DIAGNOSIS — G4710 Hypersomnia, unspecified: Secondary | ICD-10-CM

## 2013-11-19 HISTORY — DX: Orthostatic hypotension: I95.1

## 2013-11-19 MED ORDER — MIDODRINE HCL 5 MG PO TABS: 5.0000 mg | ORAL_TABLET | Freq: Two times a day (BID) | ORAL | Status: DC

## 2013-11-19 NOTE — Progress Notes (Signed)
[facility].    Provider:  Larey Seat, M D  Referring Provider: Sharilyn Sites, MD Primary Care Physician:  Purvis Kilts, MD    HPI:  [name] [name] is a 80 y.o. male , who is seen here as a referral from Dr. Hilma Favors for evaluation of dizziness and headaches.    Mr. Livingood reports that he was seen at [facility] about 5.5 years ago, of which i have no records today. He is referred for a third opinion about chronic temporal , bilateral headaches, previously he has seen [facility], Christel Jones last month  and  Dr. Merlene Laughter in [place] . A brain MRI was obtained on 10-29-13 and reviewed here today, it was interpreted by Dr. Maree Erie. He has chronic white matter , microvascular disease, no strokes, demyelination or bleed, nor tumour.  He has seen Dr Redmond Pulling , ENT to find a cause for the dizziness, but has not found a reason. He has had an evaluation by an audiologist. Negative. He sleeps a lot in daytime and at night , and feels sleepy all day. Dozes off whenever inactive. He has fallen asleep in company, at the dinner table.   The patient reports that as long as he sits still he does not have a dizziness sensation, but as long as he will stand up with postural change he feels lightheaded. He feels as if he drifts to the left and right and most recently had some sensation of being pulled backwards. When he feels dizzy especially in the morning, he gets relief by eating. This may be a sign of hypoglycemia. He also has low blood pressure which can cause the orthostatic hypotension.  99 mmHg systolic , 57 mmHg  diastolic - not on medications.   His girlfriend describes him as confused when ever he wakes form sleep, in AM or after a nap. He snores, he stops breathing, he has myoclonus jerks , jumpy. Not yelling or fighting.   His mother had spells, low blood pressure and now has a pacemaker , at 94 . She is doing fine.    Review of  Systems:  Out of a complete 14 system review, the patient complains of only the following symptoms, and all other reviewed systems are negative. Excessive daytime and night time sleepiness, his girlfriend reports him to sleep when he visits her , as soon as he sits down.  Losing train of thought.  "I have lock jaw in the morning when I first eat".  TMJ- claudication. Stiffness.  Orthostatic drop in BP, dizziness with postural changes.  Morning headaches, severe. Clearing as the day goes on. Snores loudly , witnessed apnea.       History   Social History  . Marital Status: Divorced    Spouse Name: N/A    Number of Children: 3  . Years of Education: HS   Occupational History  .     Social History Main Topics  . Smoking status: Former Smoker -- 1.00 packs/day for 4 years    Types: Cigarettes  . Smokeless tobacco: Former Systems developer    Types: [place] date: 01/03/1960  . Alcohol Use: No  . Drug Use: No  . Sexual Activity: Yes    Birth Control/ Protection: None   Other Topics Concern  . Not on file   Social History Narrative   Patient is single and lives alone.   Patient is retired.   Patient has a high  school education.   Patient is right-handed.   Patient drinks one cup of coffee and one cup of soda or tea daily.   Patient has three adult children.    History reviewed. No pertinent family history.  Past Medical History  Diagnosis Date  . Epigastric pain   . Chronic constipation   . GERD (gastroesophageal reflux disease)   . Bladder tumor 09  . Hoarseness   . Hyperthyroidism   . Alzheimer disease   . Shortness of breath   . Cancer     bladder tumor  . Arthritis   . Hyperlipidemia   . OSA on CPAP   . History of nuclear stress test 08/2009    negative for ischemia   . Dizziness   . ZOXWRUEA(540.9)     Past Surgical History  Procedure Laterality Date  . Upper gastrointestinal endoscopy  04/06/2010    EGD ED  . Hemorrhoid surgery    . Hand surgery       right  . Wrist fracture surgery      pins placed-left  . Cystoscopy with biopsy  01/09/2012    Procedure: CYSTOSCOPY WITH BIOPSY;  Surgeon: Marissa Nestle, MD;  Location: [facility] ORS;  Service: Urology;  Laterality: N/A;  Cystoscopy with Multiple Bladder Biopsies  . Esophagogastroduodenoscopy (egd) with esophageal dilation N/A 01/08/2013    Procedure: ESOPHAGOGASTRODUODENOSCOPY (EGD) WITH ESOPHAGEAL DILATION;  Surgeon: Rogene Houston, MD;  Location: [facility] ENDO SUITE;  Service: Endoscopy;  Laterality: N/A;  120    Current Outpatient Prescriptions  Medication Sig Dispense Refill  . cholecalciferol (VITAMIN D) 1000 UNITS tablet Take 1,000 Units by mouth daily.      [name] donepezil (ARICEPT) 5 MG tablet Take 5 mg by mouth daily.      [name] escitalopram (LEXAPRO) 20 MG tablet Take 10 mg by mouth daily.       [name] levothyroxine (SYNTHROID, LEVOTHROID) 125 MCG tablet Take 125 mcg by mouth daily. THYROID      . memantine (NAMENDA) 10 MG tablet Take 10 mg by mouth 2 (two) times daily. MEMORY      . midodrine (PROAMATINE) 5 MG tablet Take 2 tablets (10 mg total) by mouth 3 (three) times daily.  90 tablet  3  . pantoprazole (PROTONIX) 40 MG tablet Take 40 mg by mouth every morning. ACID REFLUX/ GERD      . pantoprazole (PROTONIX) 40 MG tablet TAKE ONE TABLET EVERY MORNING  30 tablet  11  . polyethylene glycol (MIRALAX / GLYCOLAX) packet Take 17 g by mouth daily.        . vitamin B-12 (CYANOCOBALAMIN) 1000 MCG tablet Take 1,000 mcg by mouth daily.       No current facility-administered medications for this visit.    Allergies as of 11/19/2013 - Review Complete 11/19/2013  Allergen Reaction Noted  . Escitalopram  07/21/2012  . Statins  11/06/2012    Vitals: BP 99/65  Pulse 60  Resp 17  Ht 5\' 11"  (1.803 m)  Wt 250 lb (113.399 kg)  BMI 34.88 kg/m2 Last Weight:  Wt Readings from Last 1 Encounters:  11/19/13 250 lb (113.399 kg)   Last Height:   Ht Readings from Last 1 Encounters:  11/19/13 5\' 11"  (1.803 m)     Physical exam:  General: The patient is awake, alert and appears not in acute distress. The patient is well groomed. Head: Normocephalic, atraumatic. Neck is supple. Mallampati 3 , neck circumference:18 inches.  Retrognathia - mouth breather . Sore  at either TMJ reported, soreness at the left temple, lock jaw in the morning -when he takes the first bites.  Cardiovascular:  Regular rate and rhythm, without  murmurs or carotid bruit, and without distended neck veins. Respiratory: Lungs are clear to auscultation. Skin:  Ankle  edema,  Pale feet and ankles.no rash Trunk: BMI is  elevated and patient  has normal posture.  Neurologic exam : The patient is awake and alert, oriented to place and time.  Memory subjective  described as sluggish , delayed recall . There is a normal attention span & concentration ability. Speech is fluent without  dysarthria, dysphonia or aphasia. Mood and affect are appropriate.  Cranial nerves: Pupils are equal and briskly reactive to light. Funduscopic exam without evidence of pallor or edema. Extraocular movements  in vertical and horizontal planes intact and without nystagmus.  Visual fields by finger perimetry are intact. Hearing to finger rub intact.  Facial sensation intact to fine touch. Facial motor strength is symmetric and tongue and uvula move midline. Tongue protrusion into either cheek is normal. Shoulder shrug is normal.   Motor exam:   Normal tone ,muscle bulk and symmetric strength in all extremities.  Sensory:  Fine touch, pinprick and vibration were tested in all extremities. Reduced fine touch and vibration sense in toes.  Proprioception was normal.  Coordination: Rapid alternating movements in the fingers/hands were normal.  Finger-to-nose maneuver  normal without evidence of ataxia, dysmetria or tremor.  Gait and station: Patient walks without assistive device and is able unassisted to climb up to the exam table. Strength within normal  limits. Stance is stable and normal. Tandem gait is unfragmented. Romberg testing is negative   Deep tendon reflexes: in the  upper and lower extremities are symmetric and intact. Babinski maneuver response is equivocal .   Assessment:  After physical and neurologic examination, review of laboratory studies, imaging, neurophysiology testing and pre-existing records, assessment is that of :     Orthostatic dizziness with baseline low BP,   EDS , fatigue in context with witnessed snoring , witnessed apnea.   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   Dementia ?   Plan:  Treatment plan and additional workup :  Midodrine for BP elevation , low dose.   Epworth is 18 points, urgent sleep study -retrognathia , witnessed apnea and snoring.   patient had CPAP with FFM in the past, not using it.  SPLIT at AHI 15 and score at 4%, need possibly oxygen ,  CO2 needs to be monitored.   Melatonin for supression of REM BD recommended.  He takes aricept for dementia.  Sed rate and CRP to be measured.      Asencion Partridge Vianne Grieshop MD 11/19/2013

## 2013-11-19 NOTE — Telephone Encounter (Signed)
[name] [name] from [facility] calling to get more information about patient's script for compression stockings: they need to know whether it's knee high or thigh highs and what compression patient needs. Please return call and advise.

## 2013-11-19 NOTE — Patient Instructions (Signed)
Sleep Apnea  Sleep apnea is a sleep disorder characterized by abnormal pauses in breathing while you sleep. When your breathing pauses, the level of oxygen in your blood decreases. This causes you to move out of deep sleep and into light sleep. As a result, your quality of sleep is poor, and the system that carries your blood throughout your body (cardiovascular system) experiences stress. If sleep apnea remains untreated, the following conditions can develop:  High blood pressure (hypertension).  Coronary artery disease.  Inability to achieve or maintain an erection (impotence).  Impairment of your thought process (cognitive dysfunction). There are three types of sleep apnea: 1. Obstructive sleep apnea--Pauses in breathing during sleep because of a blocked airway. 2. Central sleep apnea--Pauses in breathing during sleep because the area of the brain that controls your breathing does not send the correct signals to the muscles that control breathing. 3. Mixed sleep apnea--A combination of both obstructive and central sleep apnea. RISK FACTORS The following risk factors can increase your risk of developing sleep apnea:  Being overweight.  Smoking.  Having narrow passages in your nose and throat.  Being of older age.  Being male.  Alcohol use.  Sedative and tranquilizer use.  Ethnicity. Among individuals younger than 35 years, African Americans are at increased risk of sleep apnea. SYMPTOMS   Difficulty staying asleep.  Daytime sleepiness and fatigue.  Loss of energy.  Irritability.  Loud, heavy snoring.  Morning headaches.  Trouble concentrating.  Forgetfulness.  Decreased interest in sex. DIAGNOSIS  In order to diagnose sleep apnea, your caregiver will perform a physical examination. Your caregiver may suggest that you take a home sleep test. Your caregiver may also recommend that you spend the night in a sleep lab. In the sleep lab, several monitors record  information about your heart, lungs, and brain while you sleep. Your leg and arm movements and blood oxygen level are also recorded. TREATMENT The following actions may help to resolve mild sleep apnea:  Sleeping on your side.   Using a decongestant if you have nasal congestion.   Avoiding the use of depressants, including alcohol, sedatives, and narcotics.   Losing weight and modifying your diet if you are overweight. There also are devices and treatments to help open your airway:  Oral appliances. These are custom-made mouthpieces that shift your lower jaw forward and slightly open your bite. This opens your airway.  Devices that create positive airway pressure. This positive pressure "splints" your airway open to help you breathe better during sleep. The following devices create positive airway pressure:  Continuous positive airway pressure (CPAP) device. The CPAP device creates a continuous level of air pressure with an air pump. The air is delivered to your airway through a mask while you sleep. This continuous pressure keeps your airway open.  Nasal expiratory positive airway pressure (EPAP) device. The EPAP device creates positive air pressure as you exhale. The device consists of single-use valves, which are inserted into each nostril and held in place by adhesive. The valves create very little resistance when you inhale but create much more resistance when you exhale. That increased resistance creates the positive airway pressure. This positive pressure while you exhale keeps your airway open, making it easier to breath when you inhale again.  Bilevel positive airway pressure (BPAP) device. The BPAP device is used mainly in patients with central sleep apnea. This device is similar to the CPAP device because it also uses an air pump to deliver continuous air pressure   through a mask. However, with the BPAP machine, the pressure is set at two different levels. The pressure when you  exhale is lower than the pressure when you inhale.  Surgery. Typically, surgery is only done if you cannot comply with less invasive treatments or if the less invasive treatments do not improve your condition. Surgery involves removing excess tissue in your airway to create a wider passage way. Document Released: 03/17/2002 Document Revised: 07/22/2012 Document Reviewed: 08/03/2011 ExitCare Patient Information 2015 [employer]. This information is not intended to replace advice given to you by your health care provider. Make sure you discuss any questions you have with your health care provider.  

## 2013-11-19 NOTE — Telephone Encounter (Signed)
Patient's friend was notified and the rx was faxed to [facility] in [place].

## 2013-11-19 NOTE — Telephone Encounter (Signed)
Left a message for Ms. Stone to see if the patient wants the order for the compression hose faxed to [facility] in [place]?

## 2013-11-20 LAB — C-REACTIVE PROTEIN: CRP: 3 mg/L (ref 0.0–4.9)

## 2013-11-20 LAB — SEDIMENTATION RATE: Sed Rate: 5 mm/hr (ref 0–30)

## 2013-11-20 NOTE — Telephone Encounter (Signed)
[facility] was notified and the rx is for knee highs with the 15-20 strength per Dr. Brett Fairy.  They gave choices of strengths 15-20, 20-30 and 30-40.

## 2013-12-09 DIAGNOSIS — I219 Acute myocardial infarction, unspecified: Secondary | ICD-10-CM

## 2013-12-09 HISTORY — DX: Acute myocardial infarction, unspecified: I21.9

## 2013-12-17 ENCOUNTER — Encounter: Payer: Self-pay | Admitting: Cardiology

## 2013-12-17 ENCOUNTER — Encounter (HOSPITAL_COMMUNITY): Admission: EM | Disposition: A | Discharge status: Skilled Nursing Facility | Payer: Self-pay | Attending: Surgery

## 2013-12-17 ENCOUNTER — Ambulatory Visit (HOSPITAL_COMMUNITY): Payer: Medicare Other

## 2013-12-17 ENCOUNTER — Inpatient Hospital Stay (HOSPITAL_COMMUNITY)
Admission: EM | Admit: 2013-12-17 | Discharge: 2013-12-24 | DRG: 234 | Disposition: A | Discharge status: Skilled Nursing Facility | Payer: Medicare Other | Attending: Surgery | Admitting: Surgery

## 2013-12-17 ENCOUNTER — Encounter (HOSPITAL_COMMUNITY): Admission: EM | Disposition: A | Discharge status: Skilled Nursing Facility | Payer: Medicare Other | Attending: Surgery

## 2013-12-17 DIAGNOSIS — E039 Hypothyroidism, unspecified: Secondary | ICD-10-CM | POA: Diagnosis present

## 2013-12-17 DIAGNOSIS — I2510 Atherosclerotic heart disease of native coronary artery without angina pectoris: Secondary | ICD-10-CM | POA: Diagnosis present

## 2013-12-17 DIAGNOSIS — N183 Chronic kidney disease, stage 3 unspecified: Secondary | ICD-10-CM | POA: Diagnosis present

## 2013-12-17 DIAGNOSIS — E119 Type 2 diabetes mellitus without complications: Secondary | ICD-10-CM | POA: Diagnosis present

## 2013-12-17 DIAGNOSIS — G309 Alzheimer's disease, unspecified: Secondary | ICD-10-CM | POA: Diagnosis present

## 2013-12-17 DIAGNOSIS — E8779 Other fluid overload: Secondary | ICD-10-CM | POA: Diagnosis not present

## 2013-12-17 DIAGNOSIS — F0280 Dementia in other diseases classified elsewhere without behavioral disturbance: Secondary | ICD-10-CM | POA: Diagnosis present

## 2013-12-17 DIAGNOSIS — E785 Hyperlipidemia, unspecified: Secondary | ICD-10-CM | POA: Diagnosis present

## 2013-12-17 DIAGNOSIS — Z87891 Personal history of nicotine dependence: Secondary | ICD-10-CM

## 2013-12-17 DIAGNOSIS — Z79899 Other long term (current) drug therapy: Secondary | ICD-10-CM | POA: Diagnosis not present

## 2013-12-17 DIAGNOSIS — I252 Old myocardial infarction: Secondary | ICD-10-CM | POA: Diagnosis not present

## 2013-12-17 DIAGNOSIS — I951 Orthostatic hypotension: Secondary | ICD-10-CM | POA: Diagnosis present

## 2013-12-17 DIAGNOSIS — F99 Mental disorder, not otherwise specified: Secondary | ICD-10-CM | POA: Diagnosis not present

## 2013-12-17 DIAGNOSIS — F0390 Unspecified dementia without behavioral disturbance: Secondary | ICD-10-CM | POA: Diagnosis present

## 2013-12-17 DIAGNOSIS — I2119 ST elevation (STEMI) myocardial infarction involving other coronary artery of inferior wall: Principal | ICD-10-CM | POA: Diagnosis present

## 2013-12-17 DIAGNOSIS — G4733 Obstructive sleep apnea (adult) (pediatric): Secondary | ICD-10-CM | POA: Diagnosis present

## 2013-12-17 DIAGNOSIS — Z7982 Long term (current) use of aspirin: Secondary | ICD-10-CM | POA: Diagnosis not present

## 2013-12-17 DIAGNOSIS — K219 Gastro-esophageal reflux disease without esophagitis: Secondary | ICD-10-CM | POA: Diagnosis present

## 2013-12-17 DIAGNOSIS — K5900 Constipation, unspecified: Secondary | ICD-10-CM | POA: Diagnosis present

## 2013-12-17 DIAGNOSIS — I213 ST elevation (STEMI) myocardial infarction of unspecified site: Secondary | ICD-10-CM

## 2013-12-17 DIAGNOSIS — Z951 Presence of aortocoronary bypass graft: Secondary | ICD-10-CM

## 2013-12-17 DIAGNOSIS — M129 Arthropathy, unspecified: Secondary | ICD-10-CM | POA: Diagnosis present

## 2013-12-17 DIAGNOSIS — I2582 Chronic total occlusion of coronary artery: Secondary | ICD-10-CM | POA: Diagnosis not present

## 2013-12-17 DIAGNOSIS — D62 Acute posthemorrhagic anemia: Secondary | ICD-10-CM | POA: Diagnosis not present

## 2013-12-17 HISTORY — PX: LEFT HEART CATHETERIZATION WITH CORONARY ANGIOGRAM: SHX5451

## 2013-12-17 HISTORY — PX: LEFT HEART CATH: SHX5478

## 2013-12-17 LAB — COMPREHENSIVE METABOLIC PANEL
ALBUMIN: 3.3 g/dL — AB (ref 3.5–5.2)
ALK PHOS: 63 U/L (ref 39–117)
ALT: 9 U/L (ref 0–53)
ANION GAP: 12 (ref 5–15)
AST: 22 U/L (ref 0–37)
BUN: 25 mg/dL — AB (ref 6–23)
CO2: 24 meq/L (ref 19–32)
CREATININE: 1.74 mg/dL — AB (ref 0.50–1.35)
Calcium: 9 mg/dL (ref 8.4–10.5)
Chloride: 104 mEq/L (ref 96–112)
GFR calc Af Amer: 42 mL/min — ABNORMAL LOW (ref >90)
GFR calc non Af Amer: 36 mL/min — ABNORMAL LOW (ref >90)
Glucose, Bld: 164 mg/dL — ABNORMAL HIGH (ref 70–99)
Potassium: 4.1 mEq/L (ref 3.7–5.3)
Sodium: 140 mEq/L (ref 137–147)
Total Bilirubin: 0.2 mg/dL — ABNORMAL LOW (ref 0.3–1.2)
Total Protein: 6.6 g/dL (ref 6.0–8.3)

## 2013-12-17 LAB — CBC
HEMATOCRIT: 41.8 % (ref 39.0–52.0)
Hemoglobin: 13.7 g/dL (ref 13.0–17.0)
MCH: 29.5 pg (ref 26.0–34.0)
MCHC: 32.8 g/dL (ref 30.0–36.0)
MCV: 90.1 fL (ref 78.0–100.0)
Platelets: 215 10*3/uL (ref 150–400)
RBC: 4.64 MIL/uL (ref 4.22–5.81)
RDW: 13.4 % (ref 11.5–15.5)
WBC: 9.4 10*3/uL (ref 4.0–10.5)

## 2013-12-17 LAB — APTT: APTT: 77 s — AB (ref 24–37)

## 2013-12-17 LAB — POCT I-STAT TROPONIN I: Troponin i, poc: 1.06 ng/mL (ref 0.00–0.08)

## 2013-12-17 LAB — PROTIME-INR
INR: 1.11 (ref 0.00–1.49)
PROTHROMBIN TIME: 14.3 s (ref 11.6–15.2)

## 2013-12-17 SURGERY — LEFT HEART CATH
Anesthesia: LOCAL

## 2013-12-17 SURGERY — LEFT HEART CATHETERIZATION WITH CORONARY ANGIOGRAM
Anesthesia: LOCAL

## 2013-12-17 MED ORDER — VERAPAMIL HCL 2.5 MG/ML IV SOLN
INTRAVENOUS | Status: AC
  Filled 2013-12-17: qty 2

## 2013-12-17 MED ORDER — HEPARIN SODIUM (PORCINE) 5000 UNIT/ML IJ SOLN
60.0000 [IU]/kg | INTRAMUSCULAR | Status: DC
Start: 2013-12-17 — End: 2013-12-17

## 2013-12-17 MED ORDER — SODIUM CHLORIDE 0.9 % IV SOLN
INTRAVENOUS | Status: DC
  Administered 2013-12-18: 14:00:00 via INTRAVENOUS
  Administered 2013-12-18: 10 mL/h via INTRAVENOUS

## 2013-12-17 MED ORDER — NITROGLYCERIN 1 MG/10 ML FOR IR/CATH LAB
INTRA_ARTERIAL | Status: AC
  Filled 2013-12-17: qty 10

## 2013-12-17 MED ORDER — MIDAZOLAM HCL 2 MG/2ML IJ SOLN
INTRAMUSCULAR | Status: AC
  Filled 2013-12-17: qty 2

## 2013-12-17 MED ORDER — TIROFIBAN HCL IV 12.5 MG/250 ML
INTRAVENOUS | Status: AC
  Filled 2013-12-17: qty 250

## 2013-12-17 MED ORDER — FENTANYL CITRATE 0.05 MG/ML IJ SOLN
INTRAMUSCULAR | Status: AC
  Filled 2013-12-17: qty 2

## 2013-12-17 MED ORDER — LIDOCAINE HCL (PF) 1 % IJ SOLN
INTRAMUSCULAR | Status: AC
  Filled 2013-12-17: qty 30

## 2013-12-17 MED ORDER — HEPARIN (PORCINE) IN NACL 2-0.9 UNIT/ML-% IJ SOLN
INTRAMUSCULAR | Status: AC
  Filled 2013-12-17: qty 1500

## 2013-12-17 NOTE — ED Notes (Signed)
Pt transported to cath lab room 5 with rapid response RN and ED RN

## 2013-12-17 NOTE — H&P (Signed)
Cardiology admission H&P note  Assessment and Plan:  *Inferior ST elevation myocardial infarction: 80 year old male with history of dyslipidemia, obstructive sleep apnea and hypothyroidism as inferior ST elevations with reciprocal changes suggesting an acute epicardial coronary occlusion. All the risks and benefits of left heart catheterization was explained to the patient but an emergent consent was obtained. Patient was emergently taken for left heart catheterization that showed triple-vessel disease with TIMI 3 distal flow in all vessels with reduced LVEF of 45%. -- Consulted CT surgery who is a away of the patient and will likely get triple bypass surgery in the morning. -- Continue aspirin 81 mg daily -- Start heparin an NTG drip.   -- Bolus of Integrillin. -- Patient has been tried on statins in the past but has been intolerant. -- We will hold off on beta blockers at this time due to relative bradycardia noted during the cath lab but likely start within 24 hours.  *Dyslipidemia:  -- We'll check fasting lipid panel in the morning and consider starting a statin after discussing intolerance.  *Orthostatic hypotension -- Consider starting midodrine later should that be necessary later.  *FENGI: -- NPO s midnight  Chief complaint: chest pain  HPI:  [name] [name] is a pleasant 80 year old male with history of dyslipidemia, obstructive sleep apnea, hypothyroidism was brought to the emergency department by EMS with an episode of chest pain. Patient was resting fairly comfortably at rest when he sudden onset 10 out of 10 substernal chest pain with radiation to right arm and upper torso associated with diaphoresis and nausea. At that point, patient contacted EMS who noted inferior ST elevation myocardial infarction. The patient was given 4 tablets of 81 mg aspirin. Of note, patient her symptoms chest pains last night which were again similar in characteristic but not as severe in severity. Patient  also noticed symptoms of dyspnea on exertion that was more pronounced today. Patient denies any prior cardiac history.   Cardiac history:  Obstructive sleep apnea  Dyslipidemia  Orthostatic hypotension of unknown etiology  Previous cardiac imaging  EKG 12/17/2013:  Normal sinus rhythm with 1 mm ST elevations in the inferior leads with her suprarenal changes in the lateral lead consistent with ST elevation myocardial infarction  TTE 11/14/2012: Normal LVEF 31-49%, grade 1 diastolic dysfunction, normal RV, normal CVP  NM Stress test 08/09/2009 :  Apical to basal inferior scar. Low risk study.  Prior cath:  None  Past Medical History Past Medical History  Diagnosis Date  . Epigastric pain   . Chronic constipation   . GERD (gastroesophageal reflux disease)   . Bladder tumor 09  . Hoarseness   . Hyperthyroidism   . Alzheimer disease   . Shortness of breath     Non-ischemic Myoview 08/2009  . Cancer     bladder tumor  . Arthritis   . Hyperlipidemia   . OSA on CPAP   . History of nuclear stress test 08/2009    negative for ischemia   . Dizziness   . Headache(784.0)   . Hypoxemia 11/19/2013  . Orthostatic hypotension 11/19/2013   Allergies: Allergies  Allergen Reactions  . Escitalopram Other (See Comments)    Reaction is unknown: states sinus drainage. ( Patient is presently taking this medication)  . Statins Other (See Comments)    unknown   Social History History   Social History  . Marital Status: Divorced    Spouse Name: N/A    Number of Children: 3  . Years of Education:  HS   Occupational History  .     Social History Main Topics  . Smoking status: Former Smoker -- 1.00 packs/day for 4 years    Types: Cigarettes  . Smokeless tobacco: Former Systems developer    Types: [place] date: 01/03/1960  . Alcohol Use: No  . Drug Use: No  . Sexual Activity: Yes    Birth Control/ Protection: None   Other Topics Concern  . Not on file   Social History Narrative    Patient is single and lives alone.   Patient is retired.   Patient has a high school education.   Patient is right-handed.   Patient drinks one cup of coffee and one cup of soda or tea daily.   Patient has three adult children.   Medications:   Family History No family history on file.  Physical Exam Filed Vitals:   12/17/13 2245  BP: 111/81  Pulse: 60  Temp: 97.7 F (36.5 C)    Physical Exam  Labs: Gen.: Appears fairly comfortable with diaphoretic HEENT: Normocephalic, moist mucous membranes Neck: None normal JVP, supple, no carotid bruits Cardiovascular: Regular rate rhythm, normal S1, normal S2, 2/6 systolic murmur best heard at the right upper sternal border. +2 pulses in bilateral upper extremities  Pulmonary: Clear to auscultation bilaterally anteriorly Extremities: No edema Neurological : No focal neurological gross deficits noted

## 2013-12-17 NOTE — ED Notes (Signed)
Pt from home. Started having left chest tightness, pressure around 2030 this evening that radiated down left arm. Called EMS. Pt given 324 baby ASA. Given 250cc NS via 18 g piv in left a/c. Rates pan 5/10 at this time. States had same CP last night, but pain went away. Positive for diaphoresis, nausea, light headedness with the CP.

## 2013-12-17 NOTE — Progress Notes (Signed)
Chaplain responded to Code [facility]emi, and encountered pt in ED.  Pt was alert and aware of what was occuring.  Escorted family to Cath Lab waiting area and alerted medical staff of their location.  Chaplain offered hospitality and time of comfort for concerned family friend.   Chaplain is available for followup if desired.   [employer], Chaplain

## 2013-12-17 NOTE — ED Notes (Signed)
Cardiologist, Dr. Ellyn Hack at bedside discussing cardiac catheterization with patient.

## 2013-12-17 NOTE — CV Procedure (Signed)
CARDIAC CATHETERIZATION REPORT  NAME:  [name] [name]   MRN: [id number] DOB:  October 14, 1933   ADMIT DATE: 12/17/2013 Procedure Date: 12/17/2013  INTERVENTIONAL CARDIOLOGIST: Leonie Man, M.D., MS PRIMARY CARE PROVIDER: Purvis Kilts, MD PRIMARY CARDIOLOGIST: Lorretta Harp, M.D.  PATIENT:  [name] [name] is a 80 y.o. obese male with mild basilar insufficiency (creatinine approximately 1.4 1.5 in 2014) anemia but intolerant to statins, OS on CPAP as well as orthostatic dizziness. He had a negative Myoview 3 years ago, and claims to have not ever have a cardiac history. Echocardiographic EF August of 2014 was 5560% with no regional wall motion abnormalities. Was in his usual state of health until the evening of September 8 when he started having substernal chest discomfort radiating from one shoulder to the other peers describes a pressure initially was 6/10. It spontaneously resolved yesterday, but then came and went intermittently throughout the day on the 9th prior to him finally contacting EMS on the symptoms just did not go away. He took 4 baby aspirin and both IV and this got there his symptoms have gotten somewhat better. He was described at [facility] as being diaphoretic and borderline hypotensive and bradycardic. The time of EMS arrival his pain was down to 6/10. After aspirin, he noted the pain was down to about 3/10 upon arrival to the emergency room. EKG is performed by EMS revealed subtle inferior ST elevations with diffuse anterior and lateral ST depressions. Upon arrival to the [facility] ER, his EKG actually had resolution of his inferior respirations. However based on his presentation in the findings on the EMS EKG, felt that he presented as an urgent involving acute coronary syndrome with potential thrombotic occlusion of the right coronary artery or circumflex. I therefore made the decision to proceed urgently to the cardiac catheterization lab  PRE-OPERATIVE DIAGNOSIS:    Inferior  ST elevation MI in evolution  PROCEDURES PERFORMED:    Left Heart Catheterization with Native Coronary Angiography  via Right Common Femoral Artery   Left Ventriculography  PROCEDURE: The patient was brought to the [facility]nd [facility] in the fasting state and prepped and draped in the usual sterile fashion for right femoral or radial artery access. A modified Allen's test was performed on the right wrist demonstrating excellent collateral flow for radial access.   Sterile technique was used including antiseptics, cap, gloves, gown, hand hygiene, mask and sheet. Skin prep: Chlorhexidine.   Consent: Risks of procedure as well as the alternatives and risks of each were explained to the (patient/caregiver). Consent for procedure obtained.   Time Out: Verified patient identification, verified procedure, site/side was marked, verified correct patient position, special equipment/implants available, medications/allergies/relevent history reviewed, required imaging and test results available. Performed.  Access:   Initial terms of radial artery access the right wrist were unsuccessful. Despite good arterial flow, the sheath wire would not advance.  Right Common Femoral Artery: 6 Fr Sheath -  fluoroscopically guided modified Seldinger Technique  Left Heart Catheterization: 5 Fr Catheters advanced or exchanged over a standard J-wire; JL 3.5 catheter advanced first.  Left Coronary Artery Cineangiography: JL 3.5 Catheter  Right Coronary Artery: JR 4 Catheter   LV Hemodynamics (LV Gram): Angle pigtail  Sheath will be sutured in place in the cardiac catheterization lab to maintain arterial access. This will allow [place] to continue anticoagulation.[name]   FINDINGS:  Hemodynamics:   Central Aortic Pressure / Mean: 142/74/102 mmHg  Left Ventricular Pressure / LVEDP:  142/17/29 mmHg  Left Ventriculography:  EF: Roughly 45-50 %  Wall Motion: Mild to moderate basal to mid  inferior hypokinesis.  Coronary Anatomy:  Dominance: Right  Left Main: Normal caliber vessel with mild distal 20% stenosis. It bifurcates distally into the LAD and Circumflex. Mild calcification and mild distal disease. LAD: Normal caliber vessel with diffuse moderate to severe stenosis in the proximal segment ranging from 40-80% prior to branch of V1 and ST 1. There are focal areas of 60 and 80% but usually disease. After the branch point there is another focal 60%. Beyond that there is maybe he 30-40% range point lesion in the distal mid vessel the vessel was essentially essentially normal beyond this as it reaches the apex.  D1: Moderate caliber, major branch with a 70-90% stenosis proximally. The vessel then actually normalizes into a moderate caliber vessel downstream.  Left Circumflex: Large caliber, nondominant vessel. It gives off a proximal OM1 and then a: 2 from the mid vessel before going to the group continues on the small posterolateral branch.  The main circumflex does not have significant disease.  OM1: Small moderate caliber vessel with diffuse proximal 70% stenosis.  OM 2: Large caliber major branch with a proximal 99% subtotal occlusion by a focal 70-80% stenosis. The remainder the vessel is large in diameter with minimal disease as it bifurcates distally.   RCA: Begins as a large caliber vessel that quickly tapers and to a 60% stenosis. Dura-Vent hand and 99+ percent subtotal occlusions of her thrombotic in nature. The entire mid vessel is diffusely diseased with anywhere from 40-60% narrowing from the original vessel size. It then normalizes after the crux only to have a roughly 50% stenosis just prior to bifurcation into the Right Posterior Descending Artery (RPDA) and the  Right Posterior AV Groove Branch (RPAV)  RPDA: Moderate caliber vessel that reaches almost to the apex. Minimal luminal irregularities.  RPL Sysytem:The RPAV begins as a moderate caliber vessel gives  rise to one small one moderate caliber posterolateral branch. Angiographically normal  After reviewing the initial angiography, the culprit lesion was thought to be the tandem lesions in the RCA, however with the stent of disease in the circumflex and LAD, the decision was made to initiate medical therapy tonsil cardiac surgery as there is no acute occlusion at this time.    MEDICATIONS:  Anesthesia:  Local Lidocaine total of 20 ml  Sedation:  2 mg IV Versed, 25 mcg IV fentanyl ;   Premedication: 4000 Units IV heparin  Omnipaque Contrast: 80 ml  Anti-Platelet Agent:  Bolus of Aggrastat  PATIENT DISPOSITION:    The patient was transferred to the PACU holding area in a hemodynamicaly stable, chest pain free condition.  The patient tolerated the procedure well, and there were no complications.  EBL:   < 10 ml  The patient was stable before, during, and after the procedure.  POST-OPERATIVE DIAGNOSIS:    Severe multivessel disease with inferior ST elevation culprit lesions being the thrombotic subtotal occlusions of the RCA. However there is a 95-99% lesion in the OM 2 vessel diffuse severe disease in the proximal LAD and first diagonal branch  Mildly reduced EF with inferior hypokinesis  Severely elevated LVEDP  PLAN OF CARE:  Based on the extent of disease, the patient's best option is for surgical revascularization. Discussed the case on the telephone with Dr. Arvid Right, agreed to see the patient in the morning and likely proceed to CABG second case tomorrow.  A was  given a single bolus of Aggrastat in the Cath Lab for a lesion stabilization, heparin infusion will be restarted upon arrival to the CCU.  Home medications will be continued  No beta blocker based on his bradycardia in the Cath Lab and prior to arrival.   Leonie Man, M.D., M.S. [facility] [address]. [place], [place]  [address]  [phone]  12/17/2013 11:58  PM

## 2013-12-17 NOTE — ED Provider Notes (Signed)
CSN: [id number]     Arrival date & time 12/17/13  2235 History   None    Chief Complaint  Patient presents with  . Chest Pain     (Consider location/radiation/quality/duration/timing/severity/associated sxs/prior Treatment) HPI The patient arrives as a code STEMI with a stuttering chest pain pattern. He arrives with chest pain that he rates 4/10. EMS found the patient to be diaphoretic and bradycardic. Patient denies any history of intracerebral bleed or recent blood in stool.  Past Medical History  Diagnosis Date  . Epigastric pain   . Chronic constipation   . GERD (gastroesophageal reflux disease)   . Bladder tumor 09  . Hoarseness   . Hyperthyroidism   . Alzheimer disease   . Shortness of breath     Non-ischemic Myoview 08/2009  . Cancer     bladder tumor  . Arthritis   . Hyperlipidemia   . OSA on CPAP   . History of nuclear stress test 08/2009    negative for ischemia   . Dizziness   . Headache(784.0)   . Hypoxemia 11/19/2013  . Orthostatic hypotension 11/19/2013   Past Surgical History  Procedure Laterality Date  . Upper gastrointestinal endoscopy  04/06/2010    EGD ED  . Hemorrhoid surgery    . Hand surgery      right  . Wrist fracture surgery      pins placed-left  . Cystoscopy with biopsy  01/09/2012    Procedure: CYSTOSCOPY WITH BIOPSY;  Surgeon: Marissa Nestle, MD;  Location: [facility] ORS;  Service: Urology;  Laterality: N/A;  Cystoscopy with Multiple Bladder Biopsies  . Esophagogastroduodenoscopy (egd) with esophageal dilation N/A 01/08/2013    Procedure: ESOPHAGOGASTRODUODENOSCOPY (EGD) WITH ESOPHAGEAL DILATION;  Surgeon: Rogene Houston, MD;  Location: [facility] ENDO SUITE;  Service: Endoscopy;  Laterality: N/A;  120   No family history on file. History  Substance Use Topics  . Smoking status: Former Smoker -- 1.00 packs/day for 4 years    Types: Cigarettes  . Smokeless tobacco: Former Systems developer    Types: [place] date: 01/03/1960  . Alcohol Use: No    Review  of Systems 10 Systems reviewed and are negative for acute change except as noted in the HPI.   Allergies  Escitalopram and Statins  Home Medications   Prior to Admission medications   Medication Sig Start Date End Date Taking? Authorizing Provider  aspirin EC 81 MG tablet Take 81 mg by mouth daily.   Yes Historical Provider, MD  donepezil (ARICEPT) 10 MG tablet Take 10 mg by mouth at bedtime.   Yes Historical Provider, MD  escitalopram (LEXAPRO) 20 MG tablet Take 20 mg by mouth daily.    Yes Historical Provider, MD  levothyroxine (SYNTHROID, LEVOTHROID) 112 MCG tablet Take 112 mcg by mouth daily before breakfast.   Yes Historical Provider, MD  midodrine (PROAMATINE) 5 MG tablet Take 1 tablet (5 mg total) by mouth 2 (two) times daily with a meal. 11/19/13  Yes Asencion Partridge Dohmeier, MD  pantoprazole (PROTONIX) 40 MG tablet Take 40 mg by mouth every morning. ACID REFLUX/ GERD   Yes Historical Provider, MD   BP 111/81  Pulse 60  Temp(Src) 97.7 F (36.5 C) (Oral)  SpO2 100% Physical Exam Patient is awake alert and appropriate no respiratory distress. Head face is normocephalic atraumatic Oral cavity is patent Lungs are clear without gross wheeze rhonchi rail Heart is regular bradycardic Abdomen is soft and nontender with an umbilical hernia Lower shot is  no significant peripheral edema no calf tenderness Distal pulses are intact Skin is mildly pale and mildly diaphoretic Neurologic is alert and oriented x3 following all commands without difficulty  ED Course  Procedures (including critical care time) Labs Review Labs Reviewed  POCT I-STAT TROPONIN I - Abnormal; Notable for the following:    Troponin i, poc 1.06 (*)    All other components within normal limits  APTT  CBC  COMPREHENSIVE METABOLIC PANEL  PROTIME-INR  I-STAT TROPOININ, ED    Imaging Review No results found.   EKG Interpretation None     Cardiology was present upon patient arrival for code STEMI. The patient  will be going to the cath lab. Heparin bolus and drip were initiated in emergency department. Aspirin is given prior to arrival.  MDM   Final diagnoses:  ST elevation myocardial infarction (STEMI), unspecified artery   The patient arises EKG changes concerning for acute ischemia and STEMI. Airways intact mental status is clear. The patient will be going vertically to the cath lab.    Charlesetta Shanks, MD 12/17/13 [phone]

## 2013-12-18 ENCOUNTER — Other Ambulatory Visit (HOSPITAL_COMMUNITY): Payer: Self-pay | Admitting: Cardiology

## 2013-12-18 ENCOUNTER — Encounter (HOSPITAL_COMMUNITY): Admission: EM | Disposition: A | Discharge status: Skilled Nursing Facility | Payer: Medicare Other | Attending: Surgery

## 2013-12-18 ENCOUNTER — Encounter (HOSPITAL_COMMUNITY): Payer: Medicare Other | Admitting: Anesthesiology

## 2013-12-18 ENCOUNTER — Encounter (HOSPITAL_COMMUNITY): Payer: Self-pay | Admitting: Anesthesiology

## 2013-12-18 ENCOUNTER — Inpatient Hospital Stay (HOSPITAL_COMMUNITY): Payer: Medicare Other

## 2013-12-18 ENCOUNTER — Inpatient Hospital Stay (HOSPITAL_COMMUNITY): Payer: Medicare Other | Admitting: Anesthesiology

## 2013-12-18 ENCOUNTER — Other Ambulatory Visit (HOSPITAL_COMMUNITY): Payer: Self-pay | Admitting: Surgery

## 2013-12-18 DIAGNOSIS — I2510 Atherosclerotic heart disease of native coronary artery without angina pectoris: Secondary | ICD-10-CM

## 2013-12-18 DIAGNOSIS — I219 Acute myocardial infarction, unspecified: Secondary | ICD-10-CM

## 2013-12-18 DIAGNOSIS — Z951 Presence of aortocoronary bypass graft: Secondary | ICD-10-CM

## 2013-12-18 DIAGNOSIS — Z01810 Encounter for preprocedural cardiovascular examination: Secondary | ICD-10-CM

## 2013-12-18 HISTORY — PX: CORONARY ARTERY BYPASS GRAFT: SHX141

## 2013-12-18 LAB — CBC
HEMATOCRIT: 39 % (ref 39.0–52.0)
HEMATOCRIT: 34.5 % — AB (ref 39.0–52.0)
Hemoglobin: 12.9 g/dL — ABNORMAL LOW (ref 13.0–17.0)
Hemoglobin: 11.5 g/dL — ABNORMAL LOW (ref 13.0–17.0)
MCH: 29.8 pg (ref 26.0–34.0)
MCH: 29.8 pg (ref 26.0–34.0)
MCHC: 33.1 g/dL (ref 30.0–36.0)
MCHC: 33.3 g/dL (ref 30.0–36.0)
MCV: 90.1 fL (ref 78.0–100.0)
MCV: 89.4 fL (ref 78.0–100.0)
Platelets: 197 10*3/uL (ref 150–400)
Platelets: 147 10*3/uL — ABNORMAL LOW (ref 150–400)
RBC: 4.33 MIL/uL (ref 4.22–5.81)
RBC: 3.86 MIL/uL — ABNORMAL LOW (ref 4.22–5.81)
RDW: 13.5 % (ref 11.5–15.5)
RDW: 13.3 % (ref 11.5–15.5)
WBC: 7.6 10*3/uL (ref 4.0–10.5)
WBC: 21.2 10*3/uL — AB (ref 4.0–10.5)

## 2013-12-18 LAB — POCT I-STAT 3, ART BLOOD GAS (G3+)
Acid-base deficit: 5 mmol/L — ABNORMAL HIGH (ref 0.0–2.0)
Acid-base deficit: 7 mmol/L — ABNORMAL HIGH (ref 0.0–2.0)
BICARBONATE: 22.9 meq/L (ref 20.0–24.0)
Bicarbonate: 19.3 mEq/L — ABNORMAL LOW (ref 20.0–24.0)
Bicarbonate: 24.9 mEq/L — ABNORMAL HIGH (ref 20.0–24.0)
O2 Saturation: 88 %
O2 Saturation: 96 %
O2 Saturation: 96 %
PCO2 ART: 50.1 mmHg — AB (ref 35.0–45.0)
PCO2 ART: 36.9 mmHg (ref 35.0–45.0)
PH ART: 7.259 — AB (ref 7.350–7.450)
PH ART: 7.321 — AB (ref 7.350–7.450)
Patient temperature: 35.3
Patient temperature: 35.7
Patient temperature: 36.6
TCO2: 21 mmol/L (ref 0–100)
TCO2: 25 mmol/L (ref 0–100)
TCO2: 26 mmol/L (ref 0–100)
pCO2 arterial: 40.9 mmHg (ref 35.0–45.0)
pH, Arterial: 7.391 (ref 7.350–7.450)
pO2, Arterial: 59 mmHg — ABNORMAL LOW (ref 80.0–100.0)
pO2, Arterial: 84 mmHg (ref 80.0–100.0)
pO2, Arterial: 84 mmHg (ref 80.0–100.0)

## 2013-12-18 LAB — URINALYSIS, ROUTINE W REFLEX MICROSCOPIC
BILIRUBIN URINE: NEGATIVE
Glucose, UA: NEGATIVE mg/dL
Hgb urine dipstick: NEGATIVE
KETONES UR: NEGATIVE mg/dL
Leukocytes, UA: NEGATIVE
Nitrite: NEGATIVE
Protein, ur: NEGATIVE mg/dL
Specific Gravity, Urine: 1.039 — ABNORMAL HIGH (ref 1.005–1.030)
UROBILINOGEN UA: 0.2 mg/dL (ref 0.0–1.0)
pH: 5.5 (ref 5.0–8.0)

## 2013-12-18 LAB — POCT I-STAT, CHEM 8
BUN: 23 mg/dL (ref 6–23)
CALCIUM ION: 1.28 mmol/L (ref 1.13–1.30)
CHLORIDE: 106 meq/L (ref 96–112)
Creatinine, Ser: 1.7 mg/dL — ABNORMAL HIGH (ref 0.50–1.35)
Glucose, Bld: 171 mg/dL — ABNORMAL HIGH (ref 70–99)
HEMATOCRIT: 40 % (ref 39.0–52.0)
HEMOGLOBIN: 13.6 g/dL (ref 13.0–17.0)
Potassium: 3.6 mEq/L — ABNORMAL LOW (ref 3.7–5.3)
SODIUM: 139 meq/L (ref 137–147)
TCO2: 22 mmol/L (ref 0–100)

## 2013-12-18 LAB — TROPONIN I
Troponin I: 2.08 ng/mL (ref <0.30)
Troponin I: 8.52 ng/mL (ref <0.30)

## 2013-12-18 LAB — HEMOGLOBIN A1C
HEMOGLOBIN A1C: 6.8 % — AB (ref <5.7)
HEMOGLOBIN A1C: 6.7 % — AB (ref <5.7)
MEAN PLASMA GLUCOSE: 148 mg/dL — AB (ref <117)
MEAN PLASMA GLUCOSE: 146 mg/dL — AB (ref <117)

## 2013-12-18 LAB — PROTIME-INR
INR: 1.41 (ref 0.00–1.49)
PROTHROMBIN TIME: 17.3 s — AB (ref 11.6–15.2)

## 2013-12-18 LAB — HEMOGLOBIN AND HEMATOCRIT, BLOOD
HCT: 33.4 % — ABNORMAL LOW (ref 39.0–52.0)
Hemoglobin: 11.3 g/dL — ABNORMAL LOW (ref 13.0–17.0)

## 2013-12-18 LAB — TYPE AND SCREEN
ABO/RH(D): A NEG
ANTIBODY SCREEN: NEGATIVE

## 2013-12-18 LAB — BLOOD GAS, ARTERIAL
Acid-base deficit: 1.5 mmol/L (ref 0.0–2.0)
Bicarbonate: 23 mEq/L (ref 20.0–24.0)
Drawn by: 313061
FIO2: 0.21 %
O2 Saturation: 94 %
PATIENT TEMPERATURE: 98.6
PCO2 ART: 40.3 mmHg (ref 35.0–45.0)
TCO2: 24.2 mmol/L (ref 0–100)
pH, Arterial: 7.374 (ref 7.350–7.450)
pO2, Arterial: 71.2 mmHg — ABNORMAL LOW (ref 80.0–100.0)

## 2013-12-18 LAB — GLUCOSE, CAPILLARY
GLUCOSE-CAPILLARY: 147 mg/dL — AB (ref 70–99)
GLUCOSE-CAPILLARY: 120 mg/dL — AB (ref 70–99)
Glucose-Capillary: 103 mg/dL — ABNORMAL HIGH (ref 70–99)

## 2013-12-18 LAB — SURGICAL PCR SCREEN
MRSA, PCR: NEGATIVE
STAPHYLOCOCCUS AUREUS: NEGATIVE

## 2013-12-18 LAB — APTT: aPTT: 29 seconds (ref 24–37)

## 2013-12-18 LAB — POCT I-STAT 4, (NA,K, GLUC, HGB,HCT)
Glucose, Bld: 167 mg/dL — ABNORMAL HIGH (ref 70–99)
HCT: 34 % — ABNORMAL LOW (ref 39.0–52.0)
HEMOGLOBIN: 11.6 g/dL — AB (ref 13.0–17.0)
Potassium: 3.4 mEq/L — ABNORMAL LOW (ref 3.7–5.3)
Sodium: 140 mEq/L (ref 137–147)

## 2013-12-18 LAB — PLATELET COUNT: PLATELETS: 156 10*3/uL (ref 150–400)

## 2013-12-18 LAB — ABO/RH: ABO/RH(D): A NEG

## 2013-12-18 LAB — HEPARIN LEVEL (UNFRACTIONATED): Heparin Unfractionated: 0.66 IU/mL (ref 0.30–0.70)

## 2013-12-18 SURGERY — CORONARY ARTERY BYPASS GRAFTING (CABG)
Anesthesia: General | Site: Chest

## 2013-12-18 MED ORDER — ALBUMIN HUMAN 5 % IV SOLN
INTRAVENOUS | Status: DC | PRN
  Administered 2013-12-18: 19:00:00 via INTRAVENOUS

## 2013-12-18 MED ORDER — SODIUM BICARBONATE 8.4 % IV SOLN
100.0000 meq | Freq: Once | INTRAVENOUS | Status: AC
  Administered 2013-12-18: 100 meq via INTRAVENOUS

## 2013-12-18 MED ORDER — SODIUM CHLORIDE 0.9 % IJ SOLN
3.0000 mL | INTRAMUSCULAR | Status: DC | PRN
Start: 2013-12-18 — End: 2013-12-18

## 2013-12-18 MED ORDER — SODIUM CHLORIDE 0.9 % IJ SOLN: 3.0000 mL | INTRAMUSCULAR | Status: DC | PRN

## 2013-12-18 MED ORDER — CETYLPYRIDINIUM CHLORIDE 0.05 % MT LIQD
7.0000 mL | Freq: Four times a day (QID) | OROMUCOSAL | Status: DC
  Administered 2013-12-19 – 2013-12-22 (×14): 7 mL via OROMUCOSAL

## 2013-12-18 MED ORDER — POTASSIUM CHLORIDE 10 MEQ/50ML IV SOLN
10.0000 meq | INTRAVENOUS | Status: AC
  Administered 2013-12-18 (×3): 10 meq via INTRAVENOUS

## 2013-12-18 MED ORDER — FENTANYL CITRATE 0.05 MG/ML IJ SOLN
INTRAMUSCULAR | Status: AC
  Filled 2013-12-18: qty 5

## 2013-12-18 MED ORDER — METOPROLOL TARTRATE 12.5 MG HALF TABLET
12.5000 mg | ORAL_TABLET | Freq: Two times a day (BID) | ORAL | Status: DC
  Filled 2013-12-18 (×3): qty 1

## 2013-12-18 MED ORDER — BISACODYL 5 MG PO TBEC
10.0000 mg | DELAYED_RELEASE_TABLET | Freq: Every day | ORAL | Status: DC
  Administered 2013-12-19 – 2013-12-22 (×4): 10 mg via ORAL
  Filled 2013-12-18 (×4): qty 2

## 2013-12-18 MED ORDER — VECURONIUM BROMIDE 10 MG IV SOLR
INTRAVENOUS | Status: DC | PRN
  Administered 2013-12-18: 2 mg via INTRAVENOUS
  Administered 2013-12-18: 5 mg via INTRAVENOUS

## 2013-12-18 MED ORDER — DEXTROSE 5 % IV SOLN
1.5000 g | Freq: Two times a day (BID) | INTRAVENOUS | Status: AC
  Administered 2013-12-19 – 2013-12-20 (×4): 1.5 g via INTRAVENOUS
  Filled 2013-12-18 (×5): qty 1.5

## 2013-12-18 MED ORDER — MIDAZOLAM HCL 2 MG/2ML IJ SOLN
2.0000 mg | INTRAMUSCULAR | Status: DC | PRN
  Administered 2013-12-18 – 2013-12-19 (×4): 2 mg via INTRAVENOUS
  Filled 2013-12-18 (×4): qty 2

## 2013-12-18 MED ORDER — ACETAMINOPHEN 500 MG PO TABS
1000.0000 mg | ORAL_TABLET | Freq: Four times a day (QID) | ORAL | Status: DC
  Administered 2013-12-19 – 2013-12-22 (×13): 1000 mg via ORAL
  Filled 2013-12-18 (×21): qty 2

## 2013-12-18 MED ORDER — VANCOMYCIN HCL IN DEXTROSE 1-5 GM/200ML-% IV SOLN
1000.0000 mg | Freq: Once | INTRAVENOUS | Status: AC
  Administered 2013-12-19: 1000 mg via INTRAVENOUS
  Filled 2013-12-18: qty 200

## 2013-12-18 MED ORDER — SODIUM CHLORIDE 0.9 % IV SOLN: 250.0000 mL | INTRAVENOUS | Status: DC | PRN

## 2013-12-18 MED ORDER — LACTATED RINGERS IV SOLN
INTRAVENOUS | Status: DC | PRN
  Administered 2013-12-18: 13:00:00 via INTRAVENOUS

## 2013-12-18 MED ORDER — METOPROLOL TARTRATE 1 MG/ML IV SOLN: 2.5000 mg | INTRAVENOUS | Status: DC | PRN

## 2013-12-18 MED ORDER — HEPARIN (PORCINE) IN NACL 100-0.45 UNIT/ML-% IJ SOLN
1400.0000 [IU]/h | INTRAMUSCULAR | Status: DC
  Administered 2013-12-18: 1400 [IU]/h via INTRAVENOUS
  Filled 2013-12-18 (×2): qty 250

## 2013-12-18 MED ORDER — THROMBIN 20000 UNITS EX SOLR
OROMUCOSAL | Status: DC | PRN
  Administered 2013-12-18: 16:00:00 via TOPICAL

## 2013-12-18 MED ORDER — BISACODYL 5 MG PO TBEC: 5.0000 mg | DELAYED_RELEASE_TABLET | Freq: Once | ORAL | Status: DC

## 2013-12-18 MED ORDER — SODIUM CHLORIDE 0.9 % IV SOLN
INTRAVENOUS | Status: AC
  Administered 2013-12-18: 2.3 [IU]/h via INTRAVENOUS
  Filled 2013-12-18: qty 2.5

## 2013-12-18 MED ORDER — LEVOTHYROXINE SODIUM 112 MCG PO TABS
112.0000 ug | ORAL_TABLET | Freq: Every day | ORAL | Status: DC
  Administered 2013-12-18: 112 ug via ORAL
  Filled 2013-12-18 (×3): qty 1

## 2013-12-18 MED ORDER — SODIUM CHLORIDE 0.9 % IV SOLN
INTRAVENOUS | Status: DC
  Filled 2013-12-18: qty 2.5

## 2013-12-18 MED ORDER — ASPIRIN EC 81 MG PO TBEC
81.0000 mg | DELAYED_RELEASE_TABLET | Freq: Every day | ORAL | Status: DC
Start: 2013-12-18 — End: 2013-12-18
  Administered 2013-12-18: 81 mg via ORAL
  Filled 2013-12-18: qty 1

## 2013-12-18 MED ORDER — OXYCODONE HCL 5 MG PO TABS
5.0000 mg | ORAL_TABLET | ORAL | Status: DC | PRN
  Administered 2013-12-19: 10 mg via ORAL
  Administered 2013-12-19: 5 mg via ORAL
  Administered 2013-12-20: 10 mg via ORAL
  Filled 2013-12-18: qty 2
  Filled 2013-12-18: qty 1
  Filled 2013-12-18: qty 2

## 2013-12-18 MED ORDER — FENTANYL CITRATE 0.05 MG/ML IJ SOLN
INTRAMUSCULAR | Status: DC | PRN
  Administered 2013-12-18: 250 ug via INTRAVENOUS
  Administered 2013-12-18: 25 ug via INTRAVENOUS
  Administered 2013-12-18: 50 ug via INTRAVENOUS
  Administered 2013-12-18: 150 ug via INTRAVENOUS
  Administered 2013-12-18: 650 ug via INTRAVENOUS
  Administered 2013-12-18: 100 ug via INTRAVENOUS
  Administered 2013-12-18: 25 ug via INTRAVENOUS

## 2013-12-18 MED ORDER — FAMOTIDINE IN NACL 20-0.9 MG/50ML-% IV SOLN
20.0000 mg | Freq: Two times a day (BID) | INTRAVENOUS | Status: AC
  Administered 2013-12-18: 20 mg via INTRAVENOUS

## 2013-12-18 MED ORDER — DOCUSATE SODIUM 100 MG PO CAPS
200.0000 mg | ORAL_CAPSULE | Freq: Every day | ORAL | Status: DC
  Administered 2013-12-19 – 2013-12-22 (×4): 200 mg via ORAL
  Filled 2013-12-18 (×4): qty 2

## 2013-12-18 MED ORDER — HEPARIN SODIUM (PORCINE) 1000 UNIT/ML IJ SOLN
INTRAMUSCULAR | Status: DC | PRN
  Administered 2013-12-18: 10000 [IU] via INTRAVENOUS
  Administered 2013-12-18: 33000 [IU] via INTRAVENOUS

## 2013-12-18 MED ORDER — ACETAMINOPHEN 160 MG/5ML PO SOLN: 650.0000 mg | Freq: Once | ORAL | Status: AC

## 2013-12-18 MED ORDER — METOPROLOL TARTRATE 12.5 MG HALF TABLET
12.5000 mg | ORAL_TABLET | Freq: Once | ORAL | Status: DC
  Filled 2013-12-18: qty 1

## 2013-12-18 MED ORDER — PROTAMINE SULFATE 10 MG/ML IV SOLN
INTRAVENOUS | Status: DC | PRN
  Administered 2013-12-18: 30 mg via INTRAVENOUS
  Administered 2013-12-18: 10 mg via INTRAVENOUS
  Administered 2013-12-18: 50 mg via INTRAVENOUS
  Administered 2013-12-18: 10 mg via INTRAVENOUS
  Administered 2013-12-18: 40 mg via INTRAVENOUS
  Administered 2013-12-18 (×2): 50 mg via INTRAVENOUS
  Administered 2013-12-18: 40 mg via INTRAVENOUS
  Administered 2013-12-18: 50 mg via INTRAVENOUS

## 2013-12-18 MED ORDER — SODIUM CHLORIDE 0.9 % IJ SOLN
3.0000 mL | Freq: Two times a day (BID) | INTRAMUSCULAR | Status: DC
  Administered 2013-12-18 (×2): 3 mL via INTRAVENOUS

## 2013-12-18 MED ORDER — MIDAZOLAM HCL 10 MG/2ML IJ SOLN
INTRAMUSCULAR | Status: AC
  Filled 2013-12-18: qty 2

## 2013-12-18 MED ORDER — PROPOFOL 10 MG/ML IV BOLUS
INTRAVENOUS | Status: AC
  Filled 2013-12-18: qty 20

## 2013-12-18 MED ORDER — METOCLOPRAMIDE HCL 5 MG/ML IJ SOLN
10.0000 mg | Freq: Four times a day (QID) | INTRAMUSCULAR | Status: DC
  Administered 2013-12-18 – 2013-12-19 (×2): 10 mg via INTRAVENOUS
  Filled 2013-12-18 (×2): qty 2

## 2013-12-18 MED ORDER — TEMAZEPAM 15 MG PO CAPS: 15.0000 mg | ORAL_CAPSULE | Freq: Once | ORAL | Status: DC | PRN

## 2013-12-18 MED ORDER — THROMBIN 20000 UNITS EX SOLR
CUTANEOUS | Status: AC
  Filled 2013-12-18: qty 20000

## 2013-12-18 MED ORDER — ACETAMINOPHEN 325 MG PO TABS
650.0000 mg | ORAL_TABLET | ORAL | Status: DC | PRN
  Administered 2013-12-18: 650 mg via ORAL
  Filled 2013-12-18: qty 2

## 2013-12-18 MED ORDER — MIDAZOLAM HCL 5 MG/5ML IJ SOLN
INTRAMUSCULAR | Status: DC | PRN
  Administered 2013-12-18: 2 mg via INTRAVENOUS
  Administered 2013-12-18: 1 mg via INTRAVENOUS
  Administered 2013-12-18 (×2): 2 mg via INTRAVENOUS
  Administered 2013-12-18: 1 mg via INTRAVENOUS
  Administered 2013-12-18: 2 mg via INTRAVENOUS

## 2013-12-18 MED ORDER — CETYLPYRIDINIUM CHLORIDE 0.05 % MT LIQD
7.0000 mL | Freq: Two times a day (BID) | OROMUCOSAL | Status: DC
  Administered 2013-12-18: 7 mL via OROMUCOSAL

## 2013-12-18 MED ORDER — SODIUM CHLORIDE 0.9 % IV SOLN: INTRAVENOUS | Status: DC

## 2013-12-18 MED ORDER — NITROGLYCERIN IN D5W 200-5 MCG/ML-% IV SOLN: 2.0000 ug/min | INTRAVENOUS | Status: DC

## 2013-12-18 MED ORDER — PANTOPRAZOLE SODIUM 40 MG PO TBEC
40.0000 mg | DELAYED_RELEASE_TABLET | Freq: Every morning | ORAL | Status: DC
  Administered 2013-12-18: 40 mg via ORAL
  Filled 2013-12-18: qty 1

## 2013-12-18 MED ORDER — SODIUM CHLORIDE 0.9 % IV SOLN
INTRAVENOUS | Status: DC
  Filled 2013-12-18: qty 30

## 2013-12-18 MED ORDER — LACTATED RINGERS IV SOLN: 500.0000 mL | Freq: Once | INTRAVENOUS | Status: AC | PRN

## 2013-12-18 MED ORDER — ROCURONIUM BROMIDE 100 MG/10ML IV SOLN
INTRAVENOUS | Status: DC | PRN
  Administered 2013-12-18: 100 mg via INTRAVENOUS
  Administered 2013-12-18: 50 mg via INTRAVENOUS

## 2013-12-18 MED ORDER — NITROGLYCERIN IN D5W 200-5 MCG/ML-% IV SOLN: 0.0000 ug/min | INTRAVENOUS | Status: DC

## 2013-12-18 MED ORDER — PLASMA-LYTE 148 IV SOLN
INTRAVENOUS | Status: AC
  Administered 2013-12-18: 16:00:00
  Filled 2013-12-18: qty 2.5

## 2013-12-18 MED ORDER — DEXMEDETOMIDINE HCL IN NACL 200 MCG/50ML IV SOLN
0.1000 ug/kg/h | INTRAVENOUS | Status: DC
  Administered 2013-12-18: 0.7 ug/kg/h via INTRAVENOUS
  Administered 2013-12-19: 0.5 ug/kg/h via INTRAVENOUS
  Filled 2013-12-18 (×3): qty 50

## 2013-12-18 MED ORDER — MAGNESIUM SULFATE 4000MG/100ML IJ SOLN
4.0000 g | Freq: Once | INTRAMUSCULAR | Status: AC
Start: 2013-12-18 — End: 2013-12-19
  Administered 2013-12-18: 4 g via INTRAVENOUS
  Filled 2013-12-18: qty 100

## 2013-12-18 MED ORDER — SODIUM CHLORIDE 0.9 % IV SOLN: 250.0000 mL | INTRAVENOUS | Status: DC

## 2013-12-18 MED ORDER — SODIUM CHLORIDE 0.9 % IJ SOLN
3.0000 mL | Freq: Two times a day (BID) | INTRAMUSCULAR | Status: DC
  Administered 2013-12-19 – 2013-12-22 (×7): 3 mL via INTRAVENOUS

## 2013-12-18 MED ORDER — DEXMEDETOMIDINE HCL IN NACL 400 MCG/100ML IV SOLN
0.1000 ug/kg/h | INTRAVENOUS | Status: AC
  Administered 2013-12-18: 0.2 ug/kg/h via INTRAVENOUS
  Filled 2013-12-18: qty 100

## 2013-12-18 MED ORDER — SUCCINYLCHOLINE CHLORIDE 20 MG/ML IJ SOLN
INTRAMUSCULAR | Status: DC | PRN
  Administered 2013-12-18: 140 mg via INTRAVENOUS

## 2013-12-18 MED ORDER — DEXTROSE 5 % IV SOLN
1.5000 g | INTRAVENOUS | Status: AC
  Administered 2013-12-18: 1.5 g via INTRAVENOUS
  Administered 2013-12-18: .75 g via INTRAVENOUS
  Filled 2013-12-18: qty 1.5

## 2013-12-18 MED ORDER — ACETAMINOPHEN 650 MG RE SUPP
650.0000 mg | Freq: Once | RECTAL | Status: AC
  Administered 2013-12-18: 650 mg via RECTAL

## 2013-12-18 MED ORDER — LACTATED RINGERS IV SOLN
INTRAVENOUS | Status: DC
  Administered 2013-12-18: 20 mL/h via INTRAVENOUS

## 2013-12-18 MED ORDER — ONDANSETRON HCL 4 MG/2ML IJ SOLN
4.0000 mg | Freq: Four times a day (QID) | INTRAMUSCULAR | Status: DC | PRN
  Administered 2013-12-18: 4 mg via INTRAVENOUS

## 2013-12-18 MED ORDER — ALBUMIN HUMAN 5 % IV SOLN
250.0000 mL | INTRAVENOUS | Status: AC | PRN
  Administered 2013-12-18 – 2013-12-19 (×2): 250 mL via INTRAVENOUS
  Filled 2013-12-18: qty 250

## 2013-12-18 MED ORDER — SODIUM CHLORIDE 0.45 % IV SOLN
INTRAVENOUS | Status: DC
  Administered 2013-12-18: 20 mL/h via INTRAVENOUS

## 2013-12-18 MED ORDER — DOPAMINE-DEXTROSE 3.2-5 MG/ML-% IV SOLN
2.0000 ug/kg/min | INTRAVENOUS | Status: AC
  Administered 2013-12-18: 3 ug/kg/min via INTRAVENOUS
  Filled 2013-12-18: qty 250

## 2013-12-18 MED ORDER — PHENYLEPHRINE HCL 10 MG/ML IJ SOLN
30.0000 ug/min | INTRAVENOUS | Status: AC
  Administered 2013-12-18: 10 ug/min via INTRAVENOUS
  Filled 2013-12-18: qty 2

## 2013-12-18 MED ORDER — DEXTROSE 5 % IV SOLN
750.0000 mg | INTRAVENOUS | Status: DC
Start: 2013-12-18 — End: 2013-12-18
  Filled 2013-12-18: qty 750

## 2013-12-18 MED ORDER — INSULIN REGULAR BOLUS VIA INFUSION
0.0000 [IU] | Freq: Three times a day (TID) | INTRAVENOUS | Status: DC
Start: 2013-12-19 — End: 2013-12-19
  Filled 2013-12-18: qty 10

## 2013-12-18 MED ORDER — ONDANSETRON HCL 4 MG/2ML IJ SOLN
4.0000 mg | Freq: Four times a day (QID) | INTRAMUSCULAR | Status: DC | PRN
Start: 2013-12-18 — End: 2013-12-23

## 2013-12-18 MED ORDER — VANCOMYCIN HCL 10 G IV SOLR
1250.0000 mg | INTRAVENOUS | Status: DC
  Filled 2013-12-18: qty 1250

## 2013-12-18 MED ORDER — POTASSIUM CHLORIDE 2 MEQ/ML IV SOLN
80.0000 meq | INTRAVENOUS | Status: DC
  Filled 2013-12-18: qty 40

## 2013-12-18 MED ORDER — METOPROLOL TARTRATE 25 MG/10 ML ORAL SUSPENSION
12.5000 mg | Freq: Two times a day (BID) | ORAL | Status: DC
Start: 2013-12-19 — End: 2013-12-19
  Filled 2013-12-18 (×3): qty 5

## 2013-12-18 MED ORDER — SODIUM CHLORIDE 0.9 % IV SOLN
INTRAVENOUS | Status: AC
  Administered 2013-12-18: 70 mL/h via INTRAVENOUS
  Filled 2013-12-18: qty 40

## 2013-12-18 MED ORDER — VANCOMYCIN HCL 10 G IV SOLR
1500.0000 mg | INTRAVENOUS | Status: AC
  Administered 2013-12-18: 1500 mg via INTRAVENOUS
  Filled 2013-12-18: qty 1500

## 2013-12-18 MED ORDER — DONEPEZIL HCL 10 MG PO TABS
10.0000 mg | ORAL_TABLET | Freq: Every day | ORAL | Status: DC
  Administered 2013-12-18 – 2013-12-23 (×5): 10 mg via ORAL
  Filled 2013-12-18 (×8): qty 1

## 2013-12-18 MED ORDER — CHLORHEXIDINE GLUCONATE CLOTH 2 % EX PADS
6.0000 | MEDICATED_PAD | Freq: Once | CUTANEOUS | Status: AC
  Administered 2013-12-18: 6 via TOPICAL

## 2013-12-18 MED ORDER — DOPAMINE-DEXTROSE 3.2-5 MG/ML-% IV SOLN
2.5000 ug/kg/min | INTRAVENOUS | Status: DC
  Administered 2013-12-18 – 2013-12-19 (×2): 3 ug/kg/min via INTRAVENOUS
  Filled 2013-12-18: qty 250

## 2013-12-18 MED ORDER — ASPIRIN 81 MG PO CHEW: 324.0000 mg | CHEWABLE_TABLET | Freq: Every day | ORAL | Status: DC

## 2013-12-18 MED ORDER — BISACODYL 10 MG RE SUPP: 10.0000 mg | Freq: Every day | RECTAL | Status: DC

## 2013-12-18 MED ORDER — ACETAMINOPHEN 160 MG/5ML PO SOLN: 1000.0000 mg | Freq: Four times a day (QID) | ORAL | Status: DC

## 2013-12-18 MED ORDER — NITROGLYCERIN IN D5W 200-5 MCG/ML-% IV SOLN
2.0000 ug/min | INTRAVENOUS | Status: DC
  Filled 2013-12-18: qty 250

## 2013-12-18 MED ORDER — MORPHINE SULFATE 2 MG/ML IJ SOLN
2.0000 mg | INTRAMUSCULAR | Status: DC | PRN
  Administered 2013-12-18 – 2013-12-19 (×3): 4 mg via INTRAVENOUS
  Administered 2013-12-19: 2 mg via INTRAVENOUS
  Administered 2013-12-20 – 2013-12-23 (×4): 4 mg via INTRAVENOUS
  Filled 2013-12-18 (×2): qty 2
  Filled 2013-12-18: qty 1
  Filled 2013-12-18 (×5): qty 2

## 2013-12-18 MED ORDER — MAGNESIUM SULFATE 50 % IJ SOLN
40.0000 meq | INTRAMUSCULAR | Status: DC
  Filled 2013-12-18: qty 10

## 2013-12-18 MED ORDER — PHENYLEPHRINE HCL 10 MG/ML IJ SOLN
0.0000 ug/min | INTRAVENOUS | Status: DC
  Filled 2013-12-18: qty 2

## 2013-12-18 MED ORDER — HEMOSTATIC AGENTS (NO CHARGE) OPTIME
TOPICAL | Status: DC | PRN
  Administered 2013-12-18: 1 via TOPICAL

## 2013-12-18 MED ORDER — CHLORHEXIDINE GLUCONATE 0.12 % MT SOLN
15.0000 mL | Freq: Two times a day (BID) | OROMUCOSAL | Status: DC
  Administered 2013-12-18 – 2013-12-21 (×6): 15 mL via OROMUCOSAL
  Filled 2013-12-18 (×6): qty 15

## 2013-12-18 MED ORDER — ASPIRIN EC 325 MG PO TBEC
325.0000 mg | DELAYED_RELEASE_TABLET | Freq: Every day | ORAL | Status: DC
  Administered 2013-12-19 – 2013-12-22 (×4): 325 mg via ORAL
  Filled 2013-12-18 (×6): qty 1

## 2013-12-18 MED ORDER — INFLUENZA VAC SPLIT QUAD 0.5 ML IM SUSY: 0.5000 mL | PREFILLED_SYRINGE | INTRAMUSCULAR | Status: DC

## 2013-12-18 MED ORDER — PANTOPRAZOLE SODIUM 40 MG PO TBEC
40.0000 mg | DELAYED_RELEASE_TABLET | Freq: Every day | ORAL | Status: DC
  Administered 2013-12-20 – 2013-12-24 (×5): 40 mg via ORAL
  Filled 2013-12-18 (×6): qty 1

## 2013-12-18 MED ORDER — MORPHINE SULFATE 2 MG/ML IJ SOLN
1.0000 mg | INTRAMUSCULAR | Status: AC | PRN
  Administered 2013-12-18 (×2): 2 mg via INTRAVENOUS
  Filled 2013-12-18 (×2): qty 1

## 2013-12-18 MED ORDER — EPINEPHRINE HCL 1 MG/ML IJ SOLN
0.5000 ug/min | INTRAVENOUS | Status: DC
  Filled 2013-12-18: qty 4

## 2013-12-18 SURGICAL SUPPLY — 102 items
ATTRACTOMAT 16X20 MAGNETIC DRP (DRAPES) ×3 IMPLANT
BAG DECANTER FOR FLEXI CONT (MISCELLANEOUS) ×3 IMPLANT
BANDAGE ELASTIC 4 VELCRO ST LF (GAUZE/BANDAGES/DRESSINGS) ×3 IMPLANT
BANDAGE ELASTIC 6 VELCRO ST LF (GAUZE/BANDAGES/DRESSINGS) ×3 IMPLANT
BASKET HEART  (ORDER IN 25'S) (MISCELLANEOUS) ×1
BASKET HEART (ORDER IN 25'S) (MISCELLANEOUS) ×1
BASKET HEART (ORDER IN 25S) (MISCELLANEOUS) ×1 IMPLANT
BLADE STERNUM SYSTEM 6 (BLADE) ×3 IMPLANT
BLADE SURG 11 STRL SS (BLADE) ×2 IMPLANT
BNDG GAUZE ELAST 4 BULKY (GAUZE/BANDAGES/DRESSINGS) ×3 IMPLANT
CANISTER SUCTION 2500CC (MISCELLANEOUS) ×3 IMPLANT
CANNULA ARTERIAL NVNT 3/8 22FR (MISCELLANEOUS) ×2 IMPLANT
CARDIAC SUCTION (MISCELLANEOUS) ×3 IMPLANT
CATH ROBINSON RED A/P 18FR (CATHETERS) ×6 IMPLANT
CATH THORACIC 28FR (CATHETERS) ×3 IMPLANT
CATH THORACIC 36FR (CATHETERS) ×3 IMPLANT
CATH THORACIC 36FR RT ANG (CATHETERS) ×3 IMPLANT
CLIP TI MEDIUM 24 (CLIP) IMPLANT
CLIP TI WIDE RED SMALL 24 (CLIP) ×2 IMPLANT
CLOSURE STERI-STRIP 1/2X4 (GAUZE/BANDAGES/DRESSINGS) ×1
CLSR STERI-STRIP ANTIMIC 1/2X4 (GAUZE/BANDAGES/DRESSINGS) ×1 IMPLANT
COVER SURGICAL LIGHT HANDLE (MISCELLANEOUS) ×3 IMPLANT
CRADLE DONUT ADULT HEAD (MISCELLANEOUS) ×3 IMPLANT
DRAPE CARDIOVASCULAR INCISE (DRAPES) ×3
DRAPE SLUSH/WARMER DISC (DRAPES) ×3 IMPLANT
DRAPE SRG 135X102X78XABS (DRAPES) ×1 IMPLANT
DRSG COVADERM 4X14 (GAUZE/BANDAGES/DRESSINGS) ×3 IMPLANT
ELECT CAUTERY BLADE 6.4 (BLADE) ×3 IMPLANT
ELECT REM PT RETURN 9FT ADLT (ELECTROSURGICAL) ×6
ELECTRODE REM PT RTRN 9FT ADLT (ELECTROSURGICAL) ×2 IMPLANT
GAUZE SPONGE 4X4 12PLY STRL (GAUZE/BANDAGES/DRESSINGS) ×6 IMPLANT
GLOVE BIO SURGEON STRL SZ 6 (GLOVE) IMPLANT
GLOVE BIO SURGEON STRL SZ 6.5 (GLOVE) IMPLANT
GLOVE BIO SURGEON STRL SZ7 (GLOVE) IMPLANT
GLOVE BIO SURGEON STRL SZ7.5 (GLOVE) IMPLANT
GLOVE BIO SURGEONS STRL SZ 6.5 (GLOVE)
GLOVE BIOGEL PI IND STRL 6 (GLOVE) IMPLANT
GLOVE BIOGEL PI IND STRL 6.5 (GLOVE) IMPLANT
GLOVE BIOGEL PI IND STRL 7.0 (GLOVE) IMPLANT
GLOVE BIOGEL PI INDICATOR 6 (GLOVE)
GLOVE BIOGEL PI INDICATOR 6.5 (GLOVE)
GLOVE BIOGEL PI INDICATOR 7.0 (GLOVE)
GLOVE EUDERMIC 7 POWDERFREE (GLOVE) ×6 IMPLANT
GLOVE ORTHO TXT STRL SZ7.5 (GLOVE) IMPLANT
GOWN STRL REUS W/ TWL LRG LVL3 (GOWN DISPOSABLE) ×4 IMPLANT
GOWN STRL REUS W/ TWL XL LVL3 (GOWN DISPOSABLE) ×1 IMPLANT
GOWN STRL REUS W/TWL LRG LVL3 (GOWN DISPOSABLE) ×12
GOWN STRL REUS W/TWL XL LVL3 (GOWN DISPOSABLE) ×3
HEMOSTAT POWDER SURGIFOAM 1G (HEMOSTASIS) ×9 IMPLANT
HEMOSTAT SURGICEL 2X14 (HEMOSTASIS) ×3 IMPLANT
INSERT FOGARTY 61MM (MISCELLANEOUS) IMPLANT
INSERT FOGARTY XLG (MISCELLANEOUS) IMPLANT
KIT BASIN OR (CUSTOM PROCEDURE TRAY) ×3 IMPLANT
KIT CATH CPB BARTLE (MISCELLANEOUS) ×3 IMPLANT
KIT ROOM TURNOVER OR (KITS) ×3 IMPLANT
KIT SUCTION CATH 14FR (SUCTIONS) ×3 IMPLANT
KIT VASOVIEW W/TROCAR VH 2000 (KITS) ×3 IMPLANT
NS IRRIG 1000ML POUR BTL (IV SOLUTION) ×15 IMPLANT
PACK OPEN HEART (CUSTOM PROCEDURE TRAY) ×3 IMPLANT
PAD ARMBOARD 7.5X6 YLW CONV (MISCELLANEOUS) ×6 IMPLANT
PAD ELECT DEFIB RADIOL ZOLL (MISCELLANEOUS) ×3 IMPLANT
PENCIL BUTTON HOLSTER BLD 10FT (ELECTRODE) ×3 IMPLANT
PUNCH AORTIC ROTATE 4.0MM (MISCELLANEOUS) ×2 IMPLANT
PUNCH AORTIC ROTATE 4.5MM 8IN (MISCELLANEOUS) ×3 IMPLANT
PUNCH AORTIC ROTATE 5MM 8IN (MISCELLANEOUS) IMPLANT
SET CARDIOPLEGIA MPS 5001102 (MISCELLANEOUS) ×2 IMPLANT
SPONGE INTESTINAL PEANUT (DISPOSABLE) IMPLANT
SPONGE LAP 18X18 X RAY DECT (DISPOSABLE) ×2 IMPLANT
SPONGE LAP 4X18 X RAY DECT (DISPOSABLE) ×3 IMPLANT
SUT BONE WAX W31G (SUTURE) ×3 IMPLANT
SUT MNCRL AB 4-0 PS2 18 (SUTURE) ×2 IMPLANT
SUT PROLENE 3 0 SH DA (SUTURE) IMPLANT
SUT PROLENE 3 0 SH1 36 (SUTURE) ×3 IMPLANT
SUT PROLENE 4 0 RB 1 (SUTURE)
SUT PROLENE 4 0 SH DA (SUTURE) IMPLANT
SUT PROLENE 4-0 RB1 .5 CRCL 36 (SUTURE) IMPLANT
SUT PROLENE 5 0 C 1 36 (SUTURE) IMPLANT
SUT PROLENE 6 0 C 1 30 (SUTURE) IMPLANT
SUT PROLENE 7 0 BV 1 (SUTURE) IMPLANT
SUT PROLENE 7 0 BV1 MDA (SUTURE) ×5 IMPLANT
SUT PROLENE 8 0 BV175 6 (SUTURE) IMPLANT
SUT SILK  1 MH (SUTURE)
SUT SILK 1 MH (SUTURE) IMPLANT
SUT STEEL STERNAL CCS#1 18IN (SUTURE) IMPLANT
SUT STEEL SZ 6 DBL 3X14 BALL (SUTURE) IMPLANT
SUT VIC AB 1 CTX 36 (SUTURE) ×9
SUT VIC AB 1 CTX36XBRD ANBCTR (SUTURE) ×2 IMPLANT
SUT VIC AB 2-0 CT1 27 (SUTURE) ×3
SUT VIC AB 2-0 CT1 TAPERPNT 27 (SUTURE) IMPLANT
SUT VIC AB 2-0 CTX 27 (SUTURE) IMPLANT
SUT VIC AB 3-0 SH 27 (SUTURE)
SUT VIC AB 3-0 SH 27X BRD (SUTURE) IMPLANT
SUT VIC AB 3-0 X1 27 (SUTURE) IMPLANT
SUT VICRYL 4-0 PS2 18IN ABS (SUTURE) IMPLANT
SUTURE E-PAK OPEN HEART (SUTURE) ×3 IMPLANT
SYSTEM SAHARA CHEST DRAIN ATS (WOUND CARE) ×3 IMPLANT
TOWEL OR 17X24 6PK STRL BLUE (TOWEL DISPOSABLE) ×3 IMPLANT
TOWEL OR 17X26 10 PK STRL BLUE (TOWEL DISPOSABLE) ×3 IMPLANT
TRAY FOLEY IC TEMP SENS 16FR (CATHETERS) ×3 IMPLANT
TUBING INSUFFLATION 10FT LAP (TUBING) ×3 IMPLANT
UNDERPAD 30X30 INCONTINENT (UNDERPADS AND DIAPERS) ×3 IMPLANT
WATER STERILE IRR 1000ML POUR (IV SOLUTION) ×6 IMPLANT

## 2013-12-18 NOTE — Care Management Note (Addendum)
    Page 1 of 1   12/25/2013     2:48:05 PM CARE MANAGEMENT NOTE 12/25/2013  Patient:  Cogar,Gentry C   Account Number:  [id number]  Date Initiated:  12/18/2013  Documentation initiated by:  Elissa Hefty  Subjective/Objective Assessment:   adm w mi     Action/Plan:   lives w fam, pcp dr Sharilyn Sites   Anticipated DC Date:  12/26/2013   Anticipated DC Plan:  [place] referral  Clinical Social Worker      DC Planning Services  CM consult      Choice offered to / List presented to:             Status of service:  Completed, signed off Medicare Important Message given?  YES (If response is "NO", the following Medicare IM given date fields will be blank) Date Medicare IM given:  12/24/2013 Medicare IM given by:  Judia Arnott Date Additional Medicare IM given:   Additional Medicare IM given by:    Discharge Disposition:  [place]  Per UR Regulation:  Reviewed for med. necessity/level of care/duration of stay  If discussed at [place] of Stay Meetings, dates discussed:    Comments:   12/24/13 Ellan Lambert, RN, BSN [phone] Pt discharging to SNF today, per CSW arrangements.  12/23/13 Ellan Lambert, RN, BSN [phone] Pt s/p CABG x4 on 12/18/13.  PT/OT recommending SNF at dc for rehab.  CSW following to facilitate dc to SNF when medically stable.  Will follow progress.

## 2013-12-18 NOTE — Progress Notes (Signed)
ANTICOAGULATION CONSULT NOTE - Initial Consult  Pharmacy Consult for heparin  Indication: stemi post cath   Allergies  Allergen Reactions  . Escitalopram Other (See Comments)    Reaction is unknown: states sinus drainage. ( Patient is presently taking this medication)  . Statins Other (See Comments)    unknown    Patient Measurements:    Dosing Weight: 113 kg TBW  Vital Signs: Temp: 97.7 F (36.5 C) (09/09 2245) Temp src: Oral (09/09 2245) BP: 111/81 mmHg (09/09 2245) Pulse Rate: 64 (09/09 2256)  Labs:  Recent Labs  12/17/13 2245  HGB 13.7  HCT 41.8  PLT 215  APTT 77*  LABPROT 14.3  INR 1.11  CREATININE 1.74*    The CrCl is unknown because both a height and weight (above a minimum accepted value) are required for this calculation.   Medical History: Past Medical History  Diagnosis Date  . Epigastric pain   . Chronic constipation   . GERD (gastroesophageal reflux disease)   . Bladder tumor 09  . Hoarseness   . Hyperthyroidism   . Alzheimer disease   . Shortness of breath     Non-ischemic Myoview 08/2009  . Cancer     bladder tumor  . Arthritis   . Hyperlipidemia   . OSA on CPAP   . History of nuclear stress test 08/2009    negative for ischemia   . Dizziness   . Headache(784.0)   . Hypoxemia 11/19/2013  . Orthostatic hypotension 11/19/2013    Medications:  Prescriptions prior to admission  Medication Sig Dispense Refill  . aspirin EC 81 MG tablet Take 81 mg by mouth daily.      [name] donepezil (ARICEPT) 10 MG tablet Take 10 mg by mouth at bedtime.      [name] escitalopram (LEXAPRO) 20 MG tablet Take 20 mg by mouth daily.       [name] levothyroxine (SYNTHROID, LEVOTHROID) 112 MCG tablet Take 112 mcg by mouth daily before breakfast.      . midodrine (PROAMATINE) 5 MG tablet Take 1 tablet (5 mg total) by mouth 2 (two) times daily with a meal.  60 tablet  3  . pantoprazole (PROTONIX) 40 MG tablet Take 40 mg by mouth every morning. ACID REFLUX/ GERD         Assessment: 80 yo obese male with STEMI. After catherization reveals extensive disease surgery consult pending and probable 2nd case today with Dr. Cyndia Bent. Sheath sewn in place and heparin to continue.  Goal of Therapy:  Heparin level 0.3-0.7 units/ml Monitor platelets by anticoagulation protocol: Yes   Plan:  Begin heparin at 1400 units/hr and check HL/cbc in 8 hours.  F/u [facility]HS plans.    Curlene Dolphin 12/18/2013,12:35 AM

## 2013-12-18 NOTE — Brief Op Note (Signed)
12/17/2013 - 12/18/2013  6:26 PM  PATIENT:  [name] [name]  80 y.o. male  PRE-OPERATIVE DIAGNOSIS:  Coronary artery disease  POST-OPERATIVE DIAGNOSIS:  Coronary artery disease  PROCEDURE:   CORONARY ARTERY BYPASS GRAFTING X 4 (LIMA-LAD, SVG-D, SVG-OM, SVG-PD) ENDOSCOPIC VEIN HARVEST RIGHT THIGH  SURGEON:  Gaye Pollack, MD  ASSISTANT: Suzzanne Cloud, PA-C  ANESTHESIA:   general  PATIENT CONDITION:  ICU - intubated and hemodynamically stable.  PRE-OPERATIVE WEIGHT: 110 kg

## 2013-12-18 NOTE — Transfer of Care (Signed)
Immediate Anesthesia Transfer of Care Note  Patient: [name] [name]  Procedure(s) Performed: Procedure(s): Coronary artery bypass grafting times four using left internal mammary artery and endoscopically harvested right saphenous vein graft (N/A)  Patient Location: PACU and SICU  Anesthesia Type:General  Level of Consciousness: sedated and Patient remains intubated per anesthesia plan  Airway & Oxygen Therapy: Patient remains intubated per anesthesia plan and Patient placed on Ventilator (see vital sign flow sheet for setting)  Post-op Assessment: Report given to PACU RN and Post -op Vital signs reviewed and stable  Post vital signs: Reviewed and stable  Complications: No apparent anesthesia complications

## 2013-12-18 NOTE — Progress Notes (Signed)
CRITICAL VALUE ALERT  Critical value received:  Troponin 2.08  Date of notification:  12/18/2013  Time of notification:  2:02am  Critical value read back:Yes  Nurse who received alert:  [place]  MDs aware of patients condition. Received from cath lab. Will continue to monitor.

## 2013-12-18 NOTE — Anesthesia Preprocedure Evaluation (Addendum)
Anesthesia Evaluation  Patient identified by MRN, date of birth, ID band Patient awake    Reviewed: Allergy & Precautions, H&P , NPO status , Patient's Chart, lab work & pertinent test results  History of Anesthesia Complications Negative for: history of anesthetic complications  Airway Mallampati: II TM Distance: >3 FB Neck ROM: Full    Dental  (+) Teeth Intact, Dental Advisory Given   Pulmonary sleep apnea and Continuous Positive Airway Pressure Ventilation , former smoker,    Pulmonary exam normal       Cardiovascular + CAD and + Past MI  EF: Roughly 45-50 % Wall Motion: Mild to moderate basal to mid inferior hypokinesis.    Neuro/Psych  Headaches, Dementia negative psych ROS   GI/Hepatic Neg liver ROS, GERD-  ,  Endo/Other  Hyperthyroidism   Renal/GU negative Renal ROS     Musculoskeletal negative musculoskeletal ROS (+)   Abdominal   Peds  Hematology   Anesthesia Other Findings   Reproductive/Obstetrics negative OB ROS                         Anesthesia Physical Anesthesia Plan  ASA: IV  Anesthesia Plan: General   Post-op Pain Management:    Induction: Intravenous  Airway Management Planned: Oral ETT  Additional Equipment: PA Cath and TEE  Intra-op Plan:   Post-operative Plan: Post-operative intubation/ventilation  Informed Consent: I have reviewed the patients History and Physical, chart, labs and discussed the procedure including the risks, benefits and alternatives for the proposed anesthesia with the patient or authorized representative who has indicated his/her understanding and acceptance.   Dental advisory given  Plan Discussed with: CRNA, Anesthesiologist and Surgeon  Anesthesia Plan Comments:         Anesthesia Quick Evaluation

## 2013-12-18 NOTE — Op Note (Signed)
CARDIOVASCULAR SURGERY OPERATIVE NOTE  12/18/2013  Surgeon:  Gaye Pollack, MD  First Assistant: Suzzanne Cloud,  PA-C   Preoperative Diagnosis:  Severe multi-vessel coronary artery disease s/p acute inferior MI   Postoperative Diagnosis:  Same   Procedure:  1. Median Sternotomy 2. Extracorporeal circulation 3.   Coronary artery bypass grafting x 4   Left internal mammary graft to the LAD  SVG to diagonal  SVG to OM  SVG to PDA 4.   Endoscopic vein harvest from the right leg   Anesthesia:  General Endotracheal   Clinical History/Surgical Indication:  The patient is a 80 year old gentleman with a history of remote smoking, hyperlipidemia, OSA on CPAP, and mild dementia who lives at home alone and cares for himself. He has noted feeling fatigued lately and not very active and had an episode of chest discomfort 2 nights ago that he thought was heartburn. Then last night he was awoken with 10/10 chest pain radiating to the right arm with nausea and diaphoresis. EMS was called and ECG showed inferior ST elevation with a troponin poc of 1.06. He was taken to the cath lab and the culprit appeared to be a severely diseased RCA with thrombotic appearing subtotal occlusions. There is also 95-99% stenosis in the large OM2 with diffuse disease in the proximal LAD and D1. The EF was mildly reduced with inferior hypokinesis and severely elevated LVEDP. Severe multi-vessel atherosclerotic coronary occlusive disease: Presenting with acute inferior MI due to subtotal occlusion of the RCA. I think urgent CABG is warranted to prevent further ischemia and infarction.  I discussed the operative procedure with the patient and family including alternatives, benefits and risks; including but not limited to bleeding, blood transfusion, infection, stroke, myocardial infarction, graft failure, heart block  requiring a permanent pacemaker, organ dysfunction, and death. Gerome C. Vandervort understands and agrees to proceed.    Preparation:  The patient was seen in the preoperative holding area and the correct patient, correct operation were confirmed with the patient after reviewing the medical record and catheterization. The consent was signed by me. Preoperative antibiotics were given. A pulmonary arterial line and radial arterial line were placed by the anesthesia team. The patient was taken back to the operating room and positioned supine on the operating room table. After being placed under general endotracheal anesthesia by the anesthesia team a foley catheter was placed. The neck, chest, abdomen, and both legs were prepped with betadine soap and solution and draped in the usual sterile manner. A surgical time-out was taken and the correct patient and operative procedure were confirmed with the nursing and anesthesia staff.   Cardiopulmonary Bypass:  A median sternotomy was performed. The pericardium was opened in the midline. Right ventricular function appeared normal. The ascending aorta was of normal size and had no palpable plaque. There were no contraindications to aortic cannulation or cross-clamping. The patient was fully systemically heparinized and the ACT was maintained > 400  sec. The proximal aortic arch was cannulated with a 74 F aortic cannula for arterial inflow. Venous cannulation was performed via the right atrial appendage using a two-staged venous cannula. An antegrade cardioplegia/vent cannula was inserted into the mid-ascending aorta. Aortic occlusion was performed with a single cross-clamp. Systemic cooling to 32 degrees Centigrade and topical cooling of the heart with iced saline were used. Hyperkalemic antegrade cold blood cardioplegia was used to induce diastolic arrest and was then given at about 20 minute intervals throughout the period of arrest to maintain myocardial temperature at  or below 10 degrees centigrade. A temperature probe was inserted into the interventricular septum and an insulating pad was placed in the pericardium.   Left internal mammary harvest:  The left side of the sternum was retracted using the Rultract retractor. The left internal mammary artery was harvested as a pedicle graft. All side branches were clipped. It was a medium-sized vessel of good quality with excellent blood flow. It was ligated distally and divided. It was sprayed with topical papaverine solution to prevent vasospasm.   Endoscopic vein harvest:  The right greater saphenous vein was harvested endoscopically through a 2 cm incision medial to the right knee. It was harvested from the upper thigh to below the knee. It was a medium-sized vein of good quality. The side branches were all ligated with 4-0 silk ties.    Coronary arteries:  The coronary arteries were examined.   LAD:  Diffusely diseased in the proximal and mid portion. Distal portion free of disease. The diagonal is small but graftable.  LCX:  The OM is a large vessel that is heavily diseased proximally but just before the bifurcation there is minimal disease. Both sub-branches are intramyocardial  RCA:  The RCA proper is diffusely diseased with calcific plaque. The PDA is free of disease.   Grafts:  1. LIMA to the LAD: 1.75 mm. It was sewn end to side using 8-0 prolene continuous suture. 2. SVG to diagonal:  1.6 mm. It was sewn end to side using 7-0 prolene continuous suture. 3. SVG to OM:  1.75 mm. It was sewn end to side using 7-0 prolene continuous suture. 4. SVG to PDA :  1.6 mm. It was sewn end to side using 7-0 prolene continuous suture.  The proximal vein graft anastomoses were performed to the mid-ascending aorta using continuous 6-0 prolene suture. Graft markers were placed around the proximal anastomoses.   Completion:  The patient was rewarmed to 37 degrees Centigrade. The clamp was removed from the  LIMA pedicle and there was rapid warming of the septum and return of ventricular fibrillation. The crossclamp was removed with a time of 82 minutes. There was spontaneous return of sinus rhythm. The distal and proximal anastomoses were checked for hemostasis. The position of the grafts was satisfactory. Two temporary epicardial pacing wires were placed on the right atrium and two on the right ventricle. The patient was weaned from CPB without difficulty on dopamine at 3 mcg/kg/min that was run throughout the bypass run to augment renal blood flow. CPB time was 100 minutes. Cardiac output was 6 LPM. Heparin was fully reversed with protamine and the aortic and venous cannulas removed. Hemostasis was achieved. Mediastinal and left pleural drainage tubes were placed. The sternum was closed with double #6 stainless steel wires. The fascia was closed with continuous # 1 vicryl suture. The subcutaneous tissue was closed with 2-0 vicryl continuous suture. The skin was closed with 3-0 vicryl subcuticular suture. All sponge,  needle, and instrument counts were reported correct at the end of the case. Dry sterile dressings were placed over the incisions and around the chest tubes which were connected to pleurevac suction. The patient was then transported to the surgical intensive care unit in critical but stable condition.

## 2013-12-18 NOTE — Progress Notes (Signed)
VASCULAR LAB PRELIMINARY  PRELIMINARY  PRELIMINARY  PRELIMINARY  Pre-op Cardiac Surgery  Carotid Findings:  Bilateral:  1-39% ICA stenosis.  Vertebral artery flow is antegrade.     Upper Extremity Right Left  Brachial Pressures 119 Triphasic 116 Triphasic  Radial Waveforms Triphasic Triphasic  Ulnar Waveforms Triphasic Biphasic  Palmar Arch (Allen's Test) Normal Abnormal   Findings:  Doppler waveforms remained normal with both radial and ulnar compressions on the right. Left Doppler waveforms diminished greater than 50% with radial compression and remained normal with ulnar compression.    Lower  Extremity Right Left  Dorsalis Pedis    Anterior Tibial    Posterior Tibial    Ankle/Brachial Indices      Findings:  Strong palpable pedal pulses bilaterally.   [name], RVS 12/18/2013, 10:37 AM

## 2013-12-18 NOTE — Progress Notes (Signed)
DAILY PROGRESS NOTE  Subjective:  S/P cath yesterday with acute inferior STEMI. Troponin rise up to 8.5.  Cath revealed multi-vessel CAD - CABG was recommended and planned for today.  Objective:  Temp:  [97.7 F (36.5 C)-98 F (36.7 C)] 98 F (36.7 C) (09/10 0746) Pulse Rate:  [48-65] 54 (09/10 1000) Resp:  [13-22] 18 (09/10 1000) BP: (99-148)/(49-81) 148/65 mmHg (09/10 0800) SpO2:  [93 %-100 %] 93 % (09/10 1000) Arterial Line BP: (129-158)/(60-74) 139/67 mmHg (09/10 1000) Weight:  [243 lb 2.7 oz (110.3 kg)] 243 lb 2.7 oz (110.3 kg) (09/10 0100) Weight change:   Intake/Output from previous day: 09/09 0701 - 09/10 0700 In: 656.5 [I.V.:656.5] Out: 750 [Urine:750]  Intake/Output from this shift: Total I/O In: 64.5 [I.V.:64.5] Out: -   Medications: Current Facility-Administered Medications  Medication Dose Route Frequency Provider Last Rate Last Dose  . 0.9 %  sodium chloride infusion   Intravenous Continuous Tammy Sours, MD 10 mL/hr at 12/18/13 0839 10 mL/hr at 12/18/13 0839  . 0.9 %  sodium chloride infusion  250 mL Intravenous PRN Leonie Man, MD      . acetaminophen (TYLENOL) tablet 650 mg  650 mg Oral Q4H PRN Leonie Man, MD   650 mg at 12/18/13 0800  . aminocaproic acid (AMICAR) 10 g in sodium chloride 0.9 % 100 mL infusion   Intravenous To OR Lorretta Harp, MD      . antiseptic oral rinse (CPC / CETYLPYRIDINIUM CHLORIDE 0.05%) solution 7 mL  7 mL Mouth Rinse BID Janora Norlander, MD   7 mL at 12/18/13 1000  . aspirin EC tablet 81 mg  81 mg Oral Daily Leonie Man, MD   81 mg at 12/18/13 1047  . bisacodyl (DULCOLAX) EC tablet 5 mg  5 mg Oral Once Gaye Pollack, MD      . cefUROXime (ZINACEF) 1.5 g in dextrose 5 % 50 mL IVPB  1.5 g Intravenous To OR Lorretta Harp, MD      . cefUROXime (ZINACEF) 750 mg in dextrose 5 % 50 mL IVPB  750 mg Intravenous To OR Lorretta Harp, MD      . dexmedetomidine (PRECEDEX) 400 MCG/100ML (4 mcg/mL) infusion  0.1-0.7  mcg/kg/hr Intravenous To OR Lorretta Harp, MD      . donepezil (ARICEPT) tablet 10 mg  10 mg Oral QHS Leonie Man, MD   10 mg at 12/18/13 0126  . DOPamine (INTROPIN) 800 mg in dextrose 5 % 250 mL (3.2 mg/mL) infusion  2-20 mcg/kg/min Intravenous To OR Lorretta Harp, MD      . EPINEPHrine (ADRENALIN) 4 mg in dextrose 5 % 250 mL (0.016 mg/mL) infusion  0.5-20 mcg/min Intravenous To OR Lorretta Harp, MD      . heparin 2,500 Units, papaverine 30 mg in electrolyte-148 (PLASMALYTE-148) 500 mL irrigation   Irrigation To OR Lorretta Harp, MD      . heparin 30,000 units/NS 1000 mL solution for CELLSAVER   Other To OR Lorretta Harp, MD      . heparin ADULT infusion 100 units/mL (25000 units/250 mL)  1,400 Units/hr Intravenous Continuous Leonie Man, MD 14 mL/hr at 12/18/13 0047 1,400 Units/hr at 12/18/13 0047  . insulin regular (NOVOLIN R,HUMULIN R) 250 Units in sodium chloride 0.9 % 250 mL (1 Units/mL) infusion   Intravenous To OR Lorretta Harp, MD      . levothyroxine (SYNTHROID, LEVOTHROID) tablet 112 mcg  112 mcg Oral QAC breakfast Leonie Man, MD   112 mcg at 12/18/13 0758  . magnesium sulfate (IV Push/IM) injection 40 mEq  40 mEq Other To OR Lorretta Harp, MD      . metoprolol tartrate (LOPRESSOR) tablet 12.5 mg  12.5 mg Oral Once Gaye Pollack, MD      . nitroGLYCERIN 50 mg in dextrose 5 % 250 mL (0.2 mg/mL) infusion  2-200 mcg/min Intravenous Continuous Leonie Man, MD 3 mL/hr at 12/18/13 0030 10 mcg/min at 12/18/13 0030  . nitroGLYCERIN 50 mg in dextrose 5 % 250 mL (0.2 mg/mL) infusion  2-200 mcg/min Intravenous To OR Lorretta Harp, MD      . ondansetron [facility]) injection 4 mg  4 mg Intravenous Q6H PRN Leonie Man, MD   4 mg at 12/18/13 0702  . pantoprazole (PROTONIX) EC tablet 40 mg  40 mg Oral q morning - 10a Leonie Man, MD   40 mg at 12/18/13 1047  . phenylephrine (NEO-SYNEPHRINE) 20 mg in dextrose 5 % 250 mL (0.08 mg/mL) infusion  30-200 mcg/min  Intravenous To OR Lorretta Harp, MD      . potassium chloride injection 80 mEq  80 mEq Other To OR Lorretta Harp, MD      . sodium chloride 0.9 % injection 3 mL  3 mL Intravenous Q12H Leonie Man, MD   3 mL at 12/18/13 1052  . sodium chloride 0.9 % injection 3 mL  3 mL Intravenous PRN Leonie Man, MD      . vancomycin (VANCOCIN) 1,500 mg in sodium chloride 0.9 % 250 mL IVPB  1,500 mg Intravenous To OR Lorretta Harp, MD        Physical Exam: General appearance: alert and no distress Neck: no carotid bruit and no JVD Lungs: diminished breath sounds bilaterally Heart: regular rate and rhythm, S1, S2 normal, no murmur, click, rub or gallop Abdomen: soft, non-tender; bowel sounds normal; no masses,  no organomegaly Extremities: extremities normal, atraumatic, no cyanosis or edema Pulses: 2+ and symmetric Skin: Skin color, texture, turgor normal. No rashes or lesions Neurologic: Grossly normal Psych: Normal mood  Lab Results: Results for orders placed during the hospital encounter of 12/17/13 (from the past 48 hour(s))  APTT     Status: Abnormal   Collection Time    12/17/13 10:45 PM      Result Value Ref Range   aPTT 77 (*) 24 - 37 seconds   Comment:            IF BASELINE aPTT IS ELEVATED,     SUGGEST PATIENT RISK ASSESSMENT     BE USED TO DETERMINE APPROPRIATE     ANTICOAGULANT THERAPY.  CBC     Status: None   Collection Time    12/17/13 10:45 PM      Result Value Ref Range   WBC 9.4  4.0 - 10.5 K/uL   RBC 4.64  4.22 - 5.81 MIL/uL   Hemoglobin 13.7  13.0 - 17.0 g/dL   HCT 41.8  39.0 - 52.0 %   MCV 90.1  78.0 - 100.0 fL   MCH 29.5  26.0 - 34.0 pg   MCHC 32.8  30.0 - 36.0 g/dL   RDW 13.4  11.5 - 15.5 %   Platelets 215  150 - 400 K/uL  COMPREHENSIVE METABOLIC PANEL     Status: Abnormal   Collection Time    12/17/13 10:45 PM  Result Value Ref Range   Sodium 140  137 - 147 mEq/L   Potassium 4.1  3.7 - 5.3 mEq/L   Chloride 104  96 - 112 mEq/L   CO2 24   19 - 32 mEq/L   Glucose, Bld 164 (*) 70 - 99 mg/dL   BUN 25 (*) 6 - 23 mg/dL   Creatinine, Ser 1.74 (*) 0.50 - 1.35 mg/dL   Calcium 9.0  8.4 - 10.5 mg/dL   Total Protein 6.6  6.0 - 8.3 g/dL   Albumin 3.3 (*) 3.5 - 5.2 g/dL   AST 22  0 - 37 U/L   ALT 9  0 - 53 U/L   Alkaline Phosphatase 63  39 - 117 U/L   Total Bilirubin 0.2 (*) 0.3 - 1.2 mg/dL   GFR calc non Af Amer 36 (*) >90 mL/min   GFR calc Af Amer 42 (*) >90 mL/min   Comment: (NOTE)     The eGFR has been calculated using the CKD EPI equation.     This calculation has not been validated in all clinical situations.     eGFR's persistently <90 mL/min signify possible Chronic Kidney     Disease.   Anion gap 12  5 - 15  PROTIME-INR     Status: None   Collection Time    12/17/13 10:45 PM      Result Value Ref Range   Prothrombin Time 14.3  11.6 - 15.2 seconds   INR 1.11  0.00 - 1.49  POCT I-STAT TROPONIN I     Status: Abnormal   Collection Time    12/17/13 10:50 PM      Result Value Ref Range   Troponin i, poc 1.06 (*) 0.00 - 0.08 ng/mL   Comment NOTIFIED PHYSICIAN     Comment 3            Comment: Due to the release kinetics of cTnI,     a negative result within the first hours     of the onset of symptoms does not rule out     myocardial infarction with certainty.     If myocardial infarction is still suspected,     repeat the test at appropriate intervals.  SURGICAL PCR SCREEN     Status: None   Collection Time    12/18/13 12:36 AM      Result Value Ref Range   MRSA, PCR NEGATIVE  NEGATIVE   Staphylococcus aureus NEGATIVE  NEGATIVE   Comment:            The Xpert SA Assay (FDA     approved for NASAL specimens     in patients over 55 years of age),     is one component of     a comprehensive surveillance     program.  Test performance has     been validated by [employer] for patients greater     than or equal to 30 year old.     It is not intended     to diagnose infection nor to     guide or monitor  treatment.  TROPONIN I     Status: Abnormal   Collection Time    12/18/13  1:00 AM      Result Value Ref Range   Troponin I 2.08 (*) <0.30 ng/mL   Comment:            Due to the release kinetics of cTnI,  a negative result within the first hours     of the onset of symptoms does not rule out     myocardial infarction with certainty.     If myocardial infarction is still suspected,     repeat the test at appropriate intervals.     CRITICAL RESULT CALLED TO, READ BACK BY AND VERIFIED WITH:     DAVIS,B RN 12/18/2013 0203 JORDANS  TYPE AND SCREEN     Status: None   Collection Time    12/18/13  4:45 AM      Result Value Ref Range   ABO/RH(D) A NEG     Antibody Screen NEG     Sample Expiration 12/21/2013    ABO/RH     Status: None   Collection Time    12/18/13  4:45 AM      Result Value Ref Range   ABO/RH(D) A NEG    URINALYSIS, ROUTINE W REFLEX MICROSCOPIC     Status: Abnormal   Collection Time    12/18/13  4:49 AM      Result Value Ref Range   Color, Urine YELLOW  YELLOW   APPearance CLEAR  CLEAR   Specific Gravity, Urine 1.039 (*) 1.005 - 1.030   pH 5.5  5.0 - 8.0   Glucose, UA NEGATIVE  NEGATIVE mg/dL   Hgb urine dipstick NEGATIVE  NEGATIVE   Bilirubin Urine NEGATIVE  NEGATIVE   Ketones, ur NEGATIVE  NEGATIVE mg/dL   Protein, ur NEGATIVE  NEGATIVE mg/dL   Urobilinogen, UA 0.2  0.0 - 1.0 mg/dL   Nitrite NEGATIVE  NEGATIVE   Leukocytes, UA NEGATIVE  NEGATIVE   Comment: MICROSCOPIC NOT DONE ON URINES WITH NEGATIVE PROTEIN, BLOOD, LEUKOCYTES, NITRITE, OR GLUCOSE <1000 mg/dL.  TROPONIN I     Status: Abnormal   Collection Time    12/18/13  7:53 AM      Result Value Ref Range   Troponin I 8.52 (*) <0.30 ng/mL   Comment:            Due to the release kinetics of cTnI,     a negative result within the first hours     of the onset of symptoms does not rule out     myocardial infarction with certainty.     If myocardial infarction is still suspected,     repeat the test  at appropriate intervals.     CRITICAL VALUE NOTED.  VALUE IS CONSISTENT WITH PREVIOUSLY REPORTED AND CALLED VALUE.  HEPARIN LEVEL (UNFRACTIONATED)     Status: None   Collection Time    12/18/13  7:58 AM      Result Value Ref Range   Heparin Unfractionated 0.66  0.30 - 0.70 IU/mL   Comment:            IF HEPARIN RESULTS ARE BELOW     EXPECTED VALUES, AND PATIENT     DOSAGE HAS BEEN CONFIRMED,     SUGGEST FOLLOW UP TESTING     OF ANTITHROMBIN III LEVELS.  CBC     Status: Abnormal   Collection Time    12/18/13  7:58 AM      Result Value Ref Range   WBC 7.6  4.0 - 10.5 K/uL   RBC 4.33  4.22 - 5.81 MIL/uL   Hemoglobin 12.9 (*) 13.0 - 17.0 g/dL   HCT 39.0  39.0 - 52.0 %   MCV 90.1  78.0 - 100.0 fL   MCH 29.8  26.0 - 34.0  pg   MCHC 33.1  30.0 - 36.0 g/dL   RDW 13.5  11.5 - 15.5 %   Platelets 197  150 - 400 K/uL  BLOOD GAS, ARTERIAL     Status: Abnormal   Collection Time    12/18/13  9:20 AM      Result Value Ref Range   FIO2 0.21     pH, Arterial 7.374  7.350 - 7.450   pCO2 arterial 40.3  35.0 - 45.0 mmHg   pO2, Arterial 71.2 (*) 80.0 - 100.0 mmHg   Bicarbonate 23.0  20.0 - 24.0 mEq/L   TCO2 24.2  0 - 100 mmol/L   Acid-base deficit 1.5  0.0 - 2.0 mmol/L   O2 Saturation 94.0     Patient temperature 98.6     Collection site LEFT RADIAL     Drawn by [id number]     Sample type ARTERIAL DRAW     Allens test (pass/fail) PASS  PASS    Imaging: Imaging results have been reviewed  Assessment:  1. Active Problems: 2.   ST elevation myocardial infarction (STEMI) of inferior wall, initial episode of care 3.   CAD, multiple vessel 4.   Plan:  1. Plan for CABG today as outlined in the notes. Chest pain free currently on heparin and nitro gtts.   Time Spent Directly with Patient:  15 minutes  Length of Stay:  LOS: 1 day   Pixie Casino, MD, [facility] Attending Cardiologist [facility]  HILTY,Kenneth C 12/18/2013, 11:11 AM

## 2013-12-18 NOTE — Progress Notes (Signed)
[name] [name] for heparin  Indication: stemi post cath   Allergies  Allergen Reactions  . Escitalopram Other (See Comments)    Reaction is unknown: states sinus drainage. ( Patient is presently taking this medication)  . Statins Other (See Comments)    unknown    Patient Measurements: Height: 5' 10.5" (179.1 cm) Weight: 243 lb 2.7 oz (110.3 kg) IBW/kg (Calculated) : 74.15  Dosing Weight: 113 kg TBW  Vital Signs: Temp: 98 F (36.7 C) (09/10 0746) Temp src: Oral (09/10 0746) BP: 148/65 mmHg (09/10 0800) Pulse Rate: 54 (09/10 1000)  Labs:  Recent Labs  12/17/13 2245 12/18/13 0100 12/18/13 0753 12/18/13 0758  HGB 13.7  --   --  12.9*  HCT 41.8  --   --  39.0  PLT 215  --   --  197  APTT 77*  --   --   --   LABPROT 14.3  --   --   --   INR 1.11  --   --   --   HEPARINUNFRC  --   --   --  0.66  CREATININE 1.74*  --   --   --   TROPONINI  --  2.08* 8.52*  --     Estimated Creatinine Clearance: 44.6 ml/min (by C-G formula based on Cr of 1.74).   Medical History: Past Medical History  Diagnosis Date  . Epigastric pain   . Chronic constipation   . GERD (gastroesophageal reflux disease)   . Bladder tumor 09  . Hoarseness   . Hyperthyroidism   . Alzheimer disease   . Shortness of breath     Non-ischemic Myoview 08/2009  . Cancer     bladder tumor  . Arthritis   . Hyperlipidemia   . OSA on CPAP   . History of nuclear stress test 08/2009    negative for ischemia   . Dizziness   . Headache(784.0)   . Hypoxemia 11/19/2013  . Orthostatic hypotension 11/19/2013    Medications:  Prescriptions prior to admission  Medication Sig Dispense Refill  . aspirin EC 81 MG tablet Take 81 mg by mouth daily.      [name] donepezil (ARICEPT) 10 MG tablet Take 10 mg by mouth at bedtime.      [name] escitalopram (LEXAPRO) 20 MG tablet Take 20 mg by mouth daily.       [name] levothyroxine (SYNTHROID, LEVOTHROID) 112 MCG tablet Take 112 mcg by mouth daily before  breakfast.      . midodrine (PROAMATINE) 5 MG tablet Take 1 tablet (5 mg total) by mouth 2 (two) times daily with a meal.  60 tablet  3  . pantoprazole (PROTONIX) 40 MG tablet Take 40 mg by mouth every morning. ACID REFLUX/ GERD        Assessment: 80 yo obese male with STEMI. After catherization reveals extensive disease surgery consult pending will take this morning as 2nd case with Dr. Cyndia Bent. Sheath sewn in place and heparin to continue.   Initial heparin level this am is at upper end of goal (0.6). No bleeding issues noted will continue and current rate.  Goal of Therapy:  Heparin level 0.3-0.7 units/ml Monitor platelets by anticoagulation protocol: Yes   Plan:  Continue heparin at 1400 units/hr F/u [facility] plans.   Erin Hearing PharmD., BCPS Clinical Pharmacist Pager [phone] 12/18/2013 11:07 AM

## 2013-12-18 NOTE — Consult Note (Signed)
[address]       [place] [address]             [phone]      Cardiothoracic Surgery Consultation   Reason for Consult: Severe mult-vessel coronary artery disease with acute MI Referring Physician: Dr. Genella Mech Vandeberg is an 80 y.o. male.  HPI:   The patient is a 80 year old gentleman with a history of remote smoking, hyperlipidemia, OSA on CPAP, and mild dementia who lives at home alone and cares for himself. He has noted feeling fatigued lately and not very active and had an episode of chest discomfort 2 nights ago that he thought was heartburn. Then last night he was awoken with 10/10 chest pain radiating to the right arm with nausea and diaphoresis. EMS was called and ECG showed inferior ST elevation with a troponin poc of 1.06. He was taken to the cath lab and the culprit appeared to be a severely diseased RCA with thrombotic appearing subtotal occlusions. There is also 95-99% stenosis in the large OM2 with diffuse disease in the proximal LAD and D1. The EF was mildly reduced with inferior hypokinesis and severely elevated LVEDP.  Past Medical History  Diagnosis Date  . Epigastric pain   . Chronic constipation   . GERD (gastroesophageal reflux disease)   . Bladder tumor 09  . Hoarseness   . Hyperthyroidism   . Alzheimer disease   . Shortness of breath     Non-ischemic Myoview 08/2009  . Cancer     bladder tumor  . Arthritis   . Hyperlipidemia   . OSA on CPAP   . History of nuclear stress test 08/2009    negative for ischemia   . Dizziness   . Headache(784.0)   . Hypoxemia 11/19/2013  . Orthostatic hypotension 11/19/2013    Past Surgical History  Procedure Laterality Date  . Upper gastrointestinal endoscopy  04/06/2010    EGD ED  . Hemorrhoid surgery    . Hand surgery      right  . Wrist fracture surgery      pins placed-left  . Cystoscopy with biopsy  01/09/2012    Procedure: CYSTOSCOPY WITH BIOPSY;  Surgeon: Marissa Nestle, MD;  Location: [facility];  Service: Urology;  Laterality: N/A;  Cystoscopy with Multiple Bladder Biopsies  . Esophagogastroduodenoscopy (egd) with esophageal dilation N/A 01/08/2013    Procedure: ESOPHAGOGASTRODUODENOSCOPY (EGD) WITH ESOPHAGEAL DILATION;  Surgeon: Rogene Houston, MD;  Location: [facility];  Service: Endoscopy;  Laterality: N/A;  120    No family history on file.  Social History:  reports that he has quit smoking. His smoking use included Cigarettes. He has a 4 pack-year smoking history. He quit smokeless tobacco use about 53 years ago. His smokeless tobacco use included Chew. He reports that he does not drink alcohol or use illicit drugs.  Allergies:  Allergies  Allergen Reactions  . Escitalopram Other (See Comments)    Reaction is unknown: states sinus drainage. ( Patient is presently taking this medication)  . Statins Other (See Comments)    unknown    Medications:  I have reviewed the patient's current medications. Prior to Admission:  Prescriptions prior to admission  Medication Sig Dispense Refill  . aspirin EC 81 MG tablet Take 81 mg by mouth daily.      [name] donepezil (ARICEPT) 10 MG tablet Take 10 mg by mouth at bedtime.      [name]  escitalopram (LEXAPRO) 20 MG tablet Take 20 mg by mouth daily.       [name] levothyroxine (SYNTHROID, LEVOTHROID) 112 MCG tablet Take 112 mcg by mouth daily before breakfast.      . midodrine (PROAMATINE) 5 MG tablet Take 1 tablet (5 mg total) by mouth 2 (two) times daily with a meal.  60 tablet  3  . pantoprazole (PROTONIX) 40 MG tablet Take 40 mg by mouth every morning. ACID REFLUX/ GERD       Scheduled: . aminocaproic acid (AMICAR) for [facility]   Intravenous To OR  . antiseptic oral rinse  7 mL Mouth Rinse BID  . aspirin EC  81 mg Oral Daily  . bisacodyl  5 mg Oral Once  . cefUROXime (ZINACEF)  IV  1.5 g Intravenous To OR  . cefUROXime (ZINACEF)  IV  750 mg Intravenous To OR  . dexmedetomidine  0.1-0.7 mcg/kg/hr Intravenous To OR  .  donepezil  10 mg Oral QHS  . DOPamine  2-20 mcg/kg/min Intravenous To OR  . epinephrine  0.5-20 mcg/min Intravenous To OR  . heparin-papaverine-plasmalyte irrigation   Irrigation To OR  . heparin 30,000 units/NS 1000 mL solution for CELLSAVER   Other To OR  . insulin (NOVOLIN-R) infusion   Intravenous To OR  . levothyroxine  112 mcg Oral QAC breakfast  . magnesium sulfate  40 mEq Other To OR  . metoprolol tartrate  12.5 mg Oral Once  . nitroGLYCERIN  2-200 mcg/min Intravenous To OR  . pantoprazole  40 mg Oral q morning - 10a  . phenylephrine (NEO-SYNEPHRINE) Adult infusion  30-200 mcg/min Intravenous To OR  . potassium chloride  80 mEq Other To OR  . sodium chloride  3 mL Intravenous Q12H  . vancomycin  1,500 mg Intravenous To OR   Continuous: . sodium chloride 10 mL/hr (12/18/13 0839)  . heparin 1,400 Units/hr (12/18/13 0047)  . nitroGLYCERIN 10 mcg/min (12/18/13 0030)   PNT:IRWERX chloride, acetaminophen, ondansetron (ZOFRAN) IV, sodium chloride Anti-infectives   Start     Dose/Rate Route Frequency Ordered Stop   12/18/13 0800  vancomycin (VANCOCIN) 1,250 mg in sodium chloride 0.9 % 250 mL IVPB  Status:  Discontinued     1,250 mg 166.7 mL/hr over 90 Minutes Intravenous To Surgery 12/18/13 0748 12/18/13 0751   12/18/13 0800  cefUROXime (ZINACEF) 1.5 g in dextrose 5 % 50 mL IVPB     1.5 g 100 mL/hr over 30 Minutes Intravenous To Surgery 12/18/13 0748 12/19/13 0800   12/18/13 0800  vancomycin (VANCOCIN) 1,500 mg in sodium chloride 0.9 % 250 mL IVPB     1,500 mg 125 mL/hr over 120 Minutes Intravenous To Surgery 12/18/13 0751 12/19/13 0800   12/18/13 0745  cefUROXime (ZINACEF) 750 mg in dextrose 5 % 50 mL IVPB     750 mg 100 mL/hr over 30 Minutes Intravenous To Surgery 12/18/13 0743 12/19/13 0745      Results for orders placed during the hospital encounter of 12/17/13 (from the past 48 hour(s))  APTT     Status: Abnormal   Collection Time    12/17/13 10:45 PM      Result Value  Ref Range   aPTT 77 (*) 24 - 37 seconds   Comment:            IF BASELINE aPTT IS ELEVATED,     SUGGEST PATIENT RISK ASSESSMENT     BE USED TO DETERMINE APPROPRIATE     ANTICOAGULANT THERAPY.  CBC  Status: None   Collection Time    12/17/13 10:45 PM      Result Value Ref Range   WBC 9.4  4.0 - 10.5 K/uL   RBC 4.64  4.22 - 5.81 MIL/uL   Hemoglobin 13.7  13.0 - 17.0 g/dL   HCT 41.8  39.0 - 52.0 %   MCV 90.1  78.0 - 100.0 fL   MCH 29.5  26.0 - 34.0 pg   MCHC 32.8  30.0 - 36.0 g/dL   RDW 13.4  11.5 - 15.5 %   Platelets 215  150 - 400 K/uL  COMPREHENSIVE METABOLIC PANEL     Status: Abnormal   Collection Time    12/17/13 10:45 PM      Result Value Ref Range   Sodium 140  137 - 147 mEq/L   Potassium 4.1  3.7 - 5.3 mEq/L   Chloride 104  96 - 112 mEq/L   CO2 24  19 - 32 mEq/L   Glucose, Bld 164 (*) 70 - 99 mg/dL   BUN 25 (*) 6 - 23 mg/dL   Creatinine, Ser 1.74 (*) 0.50 - 1.35 mg/dL   Calcium 9.0  8.4 - 10.5 mg/dL   Total Protein 6.6  6.0 - 8.3 g/dL   Albumin 3.3 (*) 3.5 - 5.2 g/dL   AST 22  0 - 37 U/L   ALT 9  0 - 53 U/L   Alkaline Phosphatase 63  39 - 117 U/L   Total Bilirubin 0.2 (*) 0.3 - 1.2 mg/dL   GFR calc non Af Amer 36 (*) >90 mL/min   GFR calc Af Amer 42 (*) >90 mL/min   Comment: (NOTE)     The eGFR has been calculated using the CKD EPI equation.     This calculation has not been validated in all clinical situations.     eGFR's persistently <90 mL/min signify possible Chronic Kidney     Disease.   Anion gap 12  5 - 15  PROTIME-INR     Status: None   Collection Time    12/17/13 10:45 PM      Result Value Ref Range   Prothrombin Time 14.3  11.6 - 15.2 seconds   INR 1.11  0.00 - 1.49  POCT I-STAT TROPONIN I     Status: Abnormal   Collection Time    12/17/13 10:50 PM      Result Value Ref Range   Troponin i, poc 1.06 (*) 0.00 - 0.08 ng/mL   Comment NOTIFIED PHYSICIAN     Comment 3            Comment: Due to the release kinetics of cTnI,     a negative result  within the first hours     of the onset of symptoms does not rule out     myocardial infarction with certainty.     If myocardial infarction is still suspected,     repeat the test at appropriate intervals.  SURGICAL PCR SCREEN     Status: None   Collection Time    12/18/13 12:36 AM      Result Value Ref Range   MRSA, PCR NEGATIVE  NEGATIVE   Staphylococcus aureus NEGATIVE  NEGATIVE   Comment:            The Xpert SA Assay (FDA     approved for NASAL specimens     in patients over 42 years of age),     is one component of  a comprehensive surveillance     program.  Test performance has     been validated by [facility] for patients greater     than or equal to 32 year old.     It is not intended     to diagnose infection nor to     guide or monitor treatment.  TROPONIN I     Status: Abnormal   Collection Time    12/18/13  1:00 AM      Result Value Ref Range   Troponin I 2.08 (*) <0.30 ng/mL   Comment:            Due to the release kinetics of cTnI,     a negative result within the first hours     of the onset of symptoms does not rule out     myocardial infarction with certainty.     If myocardial infarction is still suspected,     repeat the test at appropriate intervals.     CRITICAL RESULT CALLED TO, READ BACK BY AND VERIFIED WITH:     DAVIS,B RN 12/18/2013 0203 JORDANS  TYPE AND SCREEN     Status: None   Collection Time    12/18/13  4:45 AM      Result Value Ref Range   ABO/RH(D) A NEG     Antibody Screen NEG     Sample Expiration 12/21/2013    ABO/RH     Status: None   Collection Time    12/18/13  4:45 AM      Result Value Ref Range   ABO/RH(D) A NEG    URINALYSIS, ROUTINE W REFLEX MICROSCOPIC     Status: Abnormal   Collection Time    12/18/13  4:49 AM      Result Value Ref Range   Color, Urine YELLOW  YELLOW   APPearance CLEAR  CLEAR   Specific Gravity, Urine 1.039 (*) 1.005 - 1.030   pH 5.5  5.0 - 8.0   Glucose, UA NEGATIVE  NEGATIVE mg/dL    Hgb urine dipstick NEGATIVE  NEGATIVE   Bilirubin Urine NEGATIVE  NEGATIVE   Ketones, ur NEGATIVE  NEGATIVE mg/dL   Protein, ur NEGATIVE  NEGATIVE mg/dL   Urobilinogen, UA 0.2  0.0 - 1.0 mg/dL   Nitrite NEGATIVE  NEGATIVE   Leukocytes, UA NEGATIVE  NEGATIVE   Comment: MICROSCOPIC NOT DONE ON URINES WITH NEGATIVE PROTEIN, BLOOD, LEUKOCYTES, NITRITE, OR GLUCOSE <1000 mg/dL.    No results found.  Review of Systems  Constitutional: Positive for malaise/fatigue and diaphoresis. Negative for fever, chills and weight loss.  HENT: Negative.   Eyes: Negative.   Respiratory: Negative for cough, sputum production and shortness of breath.   Cardiovascular: Positive for chest pain. Negative for palpitations, orthopnea, claudication, leg swelling and PND.  Gastrointestinal: Positive for nausea. Negative for vomiting, abdominal pain, diarrhea, constipation, blood in stool and melena.  Genitourinary: Negative.   Musculoskeletal: Negative.   Skin: Negative.   Neurological: Negative.   Endo/Heme/Allergies: Negative.   Psychiatric/Behavioral: Negative.    Blood pressure 99/49, pulse 60, temperature 98 F (36.7 C), temperature source Oral, resp. rate 13, height 5' 10.5" (1.791 m), weight 110.3 kg (243 lb 2.7 oz), SpO2 100.00%. Physical Exam  Constitutional: He is oriented to person, place, and time. He appears well-developed and well-nourished. No distress.  HENT:  Head: Normocephalic and atraumatic.  Mouth/Throat: Oropharynx is clear and moist.  Eyes: EOM are normal. Pupils are equal,  round, and reactive to light.  Neck: Normal range of motion. Neck supple. No JVD present. No thyromegaly present.  Cardiovascular: Normal rate, regular rhythm, normal heart sounds and intact distal pulses.   No murmur heard. Respiratory: Effort normal and breath sounds normal. No respiratory distress. He has no rales.  GI: Soft. Bowel sounds are normal. He exhibits no distension and no mass. There is no tenderness.    Musculoskeletal: Normal range of motion. He exhibits no edema.  Neurological: He is alert and oriented to person, place, and time. No cranial nerve deficit.  Skin: Skin is warm and dry.  Psychiatric: He has a normal mood and affect.     CARDIAC CATHETERIZATION REPORT  NAME: NEKO BOYAJIAN MRN: [id number]  DOB: 04-03-1934 ADMIT DATE: 12/17/2013  Procedure Date: 12/17/2013  INTERVENTIONAL CARDIOLOGIST: Marykay Lex, M.D., MS  PRIMARY CARE PROVIDER: [name], MD  PRIMARY CARDIOLOGIST: Runell Gess, M.D.  PATIENT: GREYDON BETKE is a 80 y.o. obese male with mild basilar insufficiency (creatinine approximately 1.4 1.5 in 2014) anemia but intolerant to statins, OS on CPAP as well as orthostatic dizziness. He had a negative Myoview 3 years ago, and claims to have not ever have a cardiac history. Echocardiographic EF August of 2014 was 5560% with no regional wall motion abnormalities.  Was in his usual state of health until the evening of September 8 when he started having substernal chest discomfort radiating from one shoulder to the other peers describes a pressure initially was 6/10. It spontaneously resolved yesterday, but then came and went intermittently throughout the day on the 9th prior to him finally contacting EMS on the symptoms just did not go away. He took 4 baby aspirin and both IV and this got there his symptoms have gotten somewhat better. He was described at [facility] as being diaphoretic and borderline hypotensive and bradycardic. The time of EMS arrival his pain was down to 6/10. After aspirin, he noted the pain was down to about 3/10 upon arrival to the emergency room. EKG is performed by EMS revealed subtle inferior ST elevations with diffuse anterior and lateral ST depressions. Upon arrival to the [facility] ER, his EKG actually had resolution of his inferior respirations. However based on his presentation in the findings on the EMS EKG, felt that he presented as an urgent  involving acute coronary syndrome with potential thrombotic occlusion of the right coronary artery or circumflex. I therefore made the decision to proceed urgently to the cardiac catheterization lab  PRE-OPERATIVE DIAGNOSIS:  Inferior ST elevation MI in evolution PROCEDURES PERFORMED:  Left Heart Catheterization with Native Coronary Angiography via Right Common Femoral Artery  Left Ventriculography PROCEDURE: The patient was brought to the 2nd [facility] in the fasting state and prepped and draped in the usual sterile fashion for right femoral or radial artery access. A modified Allen's test was performed on the right wrist demonstrating excellent collateral flow for radial access. Sterile technique was used including antiseptics, cap, gloves, gown, hand hygiene, mask and sheet. Skin prep: Chlorhexidine.  Consent: Risks of procedure as well as the alternatives and risks of each were explained to the (patient/caregiver). Consent for procedure obtained.  Time Out: Verified patient identification, verified procedure, site/side was marked, verified correct patient position, special equipment/implants available, medications/allergies/relevent history reviewed, required imaging and test results available. Performed.  Access:  Initial terms of radial artery access the right wrist were unsuccessful. Despite good arterial flow, the sheath wire would not  advance.  Right Common Femoral Artery: 6 Fr Sheath - fluoroscopically guided modified Seldinger Technique Left Heart Catheterization: 5 Fr Catheters advanced or exchanged over a standard J-wire; JL 3.5 catheter advanced first.  Left Coronary Artery Cineangiography: JL 3.5 Catheter  Right Coronary Artery: JR 4 Catheter  LV Hemodynamics (LV Gram): Angle pigtail Sheath will be sutured in place in the cardiac catheterization lab to maintain arterial access. This will allow [place] to continue anticoagulation.[name]  FINDINGS:    Hemodynamics:  Central Aortic Pressure / Mean: 142/74/102 mmHg  Left Ventricular Pressure / LVEDP: 142/17/29 mmHg Left Ventriculography:  EF: Roughly 45-50 %  Wall Motion: Mild to moderate basal to mid inferior hypokinesis. Coronary Anatomy:  Dominance: Right Left Main: Normal caliber vessel with mild distal 20% stenosis. It bifurcates distally into the LAD and Circumflex. Mild calcification and mild distal disease. LAD: Normal caliber vessel with diffuse moderate to severe stenosis in the proximal segment ranging from 40-80% prior to branch of V1 and ST 1. There are focal areas of 60 and 80% but usually disease. After the branch point there is another focal 60%. Beyond that there is maybe he 30-40% range point lesion in the distal mid vessel the vessel was essentially essentially normal beyond this as it reaches the apex.  D1: Moderate caliber, major branch with a 70-90% stenosis proximally. The vessel then actually normalizes into a moderate caliber vessel downstream. Left Circumflex: Large caliber, nondominant vessel. It gives off a proximal OM1 and then a: 2 from the mid vessel before going to the group continues on the small posterolateral branch. The main circumflex does not have significant disease.  OM1: Small moderate caliber vessel with diffuse proximal 70% stenosis.  OM 2: Large caliber major branch with a proximal 99% subtotal occlusion by a focal 70-80% stenosis. The remainder the vessel is large in diameter with minimal disease as it bifurcates distally. RCA: Begins as a large caliber vessel that quickly tapers and to a 60% stenosis. Dura-Vent hand and 99+ percent subtotal occlusions of her thrombotic in nature. The entire mid vessel is diffusely diseased with anywhere from 40-60% narrowing from the original vessel size. It then normalizes after the crux only to have a roughly 50% stenosis just prior to bifurcation into the Right Posterior Descending Artery (RPDA) and the Right  Posterior AV Groove Branch (RPAV)  RPDA: Moderate caliber vessel that reaches almost to the apex. Minimal luminal irregularities.  RPL Sysytem:The RPAV begins as a moderate caliber vessel gives rise to one small one moderate caliber posterolateral branch. Angiographically normal After reviewing the initial angiography, the culprit lesion was thought to be the tandem lesions in the RCA, however with the stent of disease in the circumflex and LAD, the decision was made to initiate medical therapy tonsil cardiac surgery as there is no acute occlusion at this time.  MEDICATIONS:  Anesthesia: Local Lidocaine total of 20 ml Sedation: 2 mg IV Versed, 25 mcg IV fentanyl ;  Premedication: 4000 Units IV heparin Omnipaque Contrast: 80 ml  Anti-Platelet Agent: Bolus of Aggrastat PATIENT DISPOSITION:  The patient was transferred to the PACU holding area in a hemodynamicaly stable, chest pain free condition.  The patient tolerated the procedure well, and there were no complications. EBL: < 10 ml  The patient was stable before, during, and after the procedure. POST-OPERATIVE DIAGNOSIS:  Severe multivessel disease with inferior ST elevation culprit lesions being the thrombotic subtotal occlusions of the RCA. However there is a 95-99% lesion in the OM 2 vessel  diffuse severe disease in the proximal LAD and first diagonal branch  Mildly reduced EF with inferior hypokinesis  Severely elevated LVEDP PLAN OF CARE:  Based on the extent of disease, the patient's best option is for surgical revascularization. Discussed the case on the telephone with Dr. Arvid Right, agreed to see the patient in the morning and likely proceed to CABG second case tomorrow.  A was given a single bolus of Aggrastat in the Cath Lab for a lesion stabilization, heparin infusion will be restarted upon arrival to the CCU.  Home medications will be continued  No beta blocker based on his bradycardia in the Cath Lab and prior to  arrival. Leonie Man, M.D., M.S.  [facility]  [address] [place], [place] [address]  [phone]  12/17/2013  11:58 PM  Assessment/Plan:  1. Severe multi-vessel atherosclerotic coronary occlusive disease: Presenting with acute inferior MI due to subtotal occlusion of the RCA. I think urgent CABG is warranted to prevent further ischemia and infarction.  2. Acute inferior MI. 3. Stage 3 chronic kidney disease with GFR of 36, Creat of 1.74.   I discussed the operative procedure with the patient and family including alternatives, benefits and risks; including but not limited to bleeding, blood transfusion, infection, stroke, myocardial infarction, graft failure, heart block requiring a permanent pacemaker, organ dysfunction, and death.  Isaish C Sausedo understands and agrees to proceed.    BARTLE,BRYAN K 12/18/2013, 7:57 AM

## 2013-12-18 NOTE — Anesthesia Procedure Notes (Signed)
Procedures

## 2013-12-18 NOTE — Progress Notes (Signed)
[id number] Discussed with pt the importance of mobility and IS after surgery. Pt can get 1250 ml on IS. Discussed sternal precautions and importance of not using arms when standing or sitting. Will re enforce after surgery. Pt has [facility]HS booklet and I gave [facility] care guide. Pt does not want to watch pre op video at this time. Pt does not know if he will have someone 24/7 for first week after surgery. He lives alone but has sons. This will need to be addressed after surgery. Will follow up after surgery. Graylon Good RN BSN 12/18/2013 11:30 AM

## 2013-12-18 NOTE — Progress Notes (Signed)
Patient sent to OR at 13:15 with no current complaints at time.  Report given to OR staff.

## 2013-12-19 ENCOUNTER — Inpatient Hospital Stay (HOSPITAL_COMMUNITY): Payer: Medicare Other

## 2013-12-19 LAB — GLUCOSE, CAPILLARY
GLUCOSE-CAPILLARY: 64 mg/dL — AB (ref 70–99)
GLUCOSE-CAPILLARY: 137 mg/dL — AB (ref 70–99)
GLUCOSE-CAPILLARY: 108 mg/dL — AB (ref 70–99)
GLUCOSE-CAPILLARY: 138 mg/dL — AB (ref 70–99)
GLUCOSE-CAPILLARY: 147 mg/dL — AB (ref 70–99)
Glucose-Capillary: 131 mg/dL — ABNORMAL HIGH (ref 70–99)
Glucose-Capillary: 99 mg/dL (ref 70–99)
Glucose-Capillary: 117 mg/dL — ABNORMAL HIGH (ref 70–99)
Glucose-Capillary: 117 mg/dL — ABNORMAL HIGH (ref 70–99)
Glucose-Capillary: 110 mg/dL — ABNORMAL HIGH (ref 70–99)
Glucose-Capillary: 106 mg/dL — ABNORMAL HIGH (ref 70–99)
Glucose-Capillary: 159 mg/dL — ABNORMAL HIGH (ref 70–99)

## 2013-12-19 LAB — POCT I-STAT, CHEM 8
BUN: 18 mg/dL (ref 6–23)
BUN: 16 mg/dL (ref 6–23)
BUN: 17 mg/dL (ref 6–23)
BUN: 17 mg/dL (ref 6–23)
BUN: 18 mg/dL (ref 6–23)
BUN: 24 mg/dL — ABNORMAL HIGH (ref 6–23)
CALCIUM ION: 1.06 mmol/L — AB (ref 1.13–1.30)
CALCIUM ION: 1.11 mmol/L — AB (ref 1.13–1.30)
CHLORIDE: 106 meq/L (ref 96–112)
CHLORIDE: 107 meq/L (ref 96–112)
CHLORIDE: 106 meq/L (ref 96–112)
CREATININE: 1.1 mg/dL (ref 0.50–1.35)
CREATININE: 1.4 mg/dL — AB (ref 0.50–1.35)
CREATININE: 1.4 mg/dL — AB (ref 0.50–1.35)
Calcium, Ion: 1.27 mmol/L (ref 1.13–1.30)
Calcium, Ion: 1.25 mmol/L (ref 1.13–1.30)
Calcium, Ion: 1.14 mmol/L (ref 1.13–1.30)
Calcium, Ion: 1.14 mmol/L (ref 1.13–1.30)
Chloride: 103 mEq/L (ref 96–112)
Chloride: 104 mEq/L (ref 96–112)
Chloride: 106 mEq/L (ref 96–112)
Creatinine, Ser: 1.4 mg/dL — ABNORMAL HIGH (ref 0.50–1.35)
Creatinine, Ser: 1.4 mg/dL — ABNORMAL HIGH (ref 0.50–1.35)
Creatinine, Ser: 1.8 mg/dL — ABNORMAL HIGH (ref 0.50–1.35)
GLUCOSE: 162 mg/dL — AB (ref 70–99)
GLUCOSE: 202 mg/dL — AB (ref 70–99)
GLUCOSE: 199 mg/dL — AB (ref 70–99)
GLUCOSE: 171 mg/dL — AB (ref 70–99)
Glucose, Bld: 136 mg/dL — ABNORMAL HIGH (ref 70–99)
Glucose, Bld: 176 mg/dL — ABNORMAL HIGH (ref 70–99)
HCT: 33 % — ABNORMAL LOW (ref 39.0–52.0)
HCT: 40 % (ref 39.0–52.0)
HCT: 33 % — ABNORMAL LOW (ref 39.0–52.0)
HCT: 31 % — ABNORMAL LOW (ref 39.0–52.0)
HCT: 32 % — ABNORMAL LOW (ref 39.0–52.0)
HEMATOCRIT: 39 % (ref 39.0–52.0)
HEMOGLOBIN: 10.9 g/dL — AB (ref 13.0–17.0)
Hemoglobin: 13.3 g/dL (ref 13.0–17.0)
Hemoglobin: 11.2 g/dL — ABNORMAL LOW (ref 13.0–17.0)
Hemoglobin: 13.6 g/dL (ref 13.0–17.0)
Hemoglobin: 11.2 g/dL — ABNORMAL LOW (ref 13.0–17.0)
Hemoglobin: 10.5 g/dL — ABNORMAL LOW (ref 13.0–17.0)
POTASSIUM: 3.9 meq/L (ref 3.7–5.3)
Potassium: 3.8 mEq/L (ref 3.7–5.3)
Potassium: 3.8 mEq/L (ref 3.7–5.3)
Potassium: 4.3 mEq/L (ref 3.7–5.3)
Potassium: 3.8 mEq/L (ref 3.7–5.3)
Potassium: 4.4 mEq/L (ref 3.7–5.3)
SODIUM: 140 meq/L (ref 137–147)
SODIUM: 139 meq/L (ref 137–147)
SODIUM: 140 meq/L (ref 137–147)
Sodium: 141 mEq/L (ref 137–147)
Sodium: 139 mEq/L (ref 137–147)
Sodium: 139 mEq/L (ref 137–147)
TCO2: 23 mmol/L (ref 0–100)
TCO2: 23 mmol/L (ref 0–100)
TCO2: 24 mmol/L (ref 0–100)
TCO2: 22 mmol/L (ref 0–100)
TCO2: 21 mmol/L (ref 0–100)
TCO2: 22 mmol/L (ref 0–100)

## 2013-12-19 LAB — POCT I-STAT 3, ART BLOOD GAS (G3+)
ACID-BASE DEFICIT: 4 mmol/L — AB (ref 0.0–2.0)
Acid-Base Excess: 1 mmol/L (ref 0.0–2.0)
Acid-base deficit: 2 mmol/L (ref 0.0–2.0)
Acid-base deficit: 4 mmol/L — ABNORMAL HIGH (ref 0.0–2.0)
BICARBONATE: 24.5 meq/L — AB (ref 20.0–24.0)
BICARBONATE: 26.2 meq/L — AB (ref 20.0–24.0)
Bicarbonate: 22.4 mEq/L (ref 20.0–24.0)
Bicarbonate: 22.1 mEq/L (ref 20.0–24.0)
Bicarbonate: 26 mEq/L — ABNORMAL HIGH (ref 20.0–24.0)
O2 SAT: 98 %
O2 SAT: 98 %
O2 Saturation: 100 %
O2 Saturation: 100 %
O2 Saturation: 86 %
PCO2 ART: 48.1 mmHg — AB (ref 35.0–45.0)
PCO2 ART: 45.2 mmHg — AB (ref 35.0–45.0)
PCO2 ART: 44 mmHg (ref 35.0–45.0)
PH ART: 7.315 — AB (ref 7.350–7.450)
PH ART: 7.304 — AB (ref 7.350–7.450)
PO2 ART: 445 mmHg — AB (ref 80.0–100.0)
PO2 ART: 393 mmHg — AB (ref 80.0–100.0)
TCO2: 26 mmol/L (ref 0–100)
TCO2: 24 mmol/L (ref 0–100)
TCO2: 23 mmol/L (ref 0–100)
TCO2: 27 mmol/L (ref 0–100)
TCO2: 27 mmol/L (ref 0–100)
pCO2 arterial: 45 mmHg (ref 35.0–45.0)
pCO2 arterial: 43.5 mmHg (ref 35.0–45.0)
pH, Arterial: 7.309 — ABNORMAL LOW (ref 7.350–7.450)
pH, Arterial: 7.368 (ref 7.350–7.450)
pH, Arterial: 7.388 (ref 7.350–7.450)
pO2, Arterial: 112 mmHg — ABNORMAL HIGH (ref 80.0–100.0)
pO2, Arterial: 100 mmHg (ref 80.0–100.0)
pO2, Arterial: 53 mmHg — ABNORMAL LOW (ref 80.0–100.0)

## 2013-12-19 LAB — CBC
HCT: 31.3 % — ABNORMAL LOW (ref 39.0–52.0)
HCT: 31 % — ABNORMAL LOW (ref 39.0–52.0)
HEMOGLOBIN: 10.3 g/dL — AB (ref 13.0–17.0)
Hemoglobin: 10.5 g/dL — ABNORMAL LOW (ref 13.0–17.0)
MCH: 29.6 pg (ref 26.0–34.0)
MCH: 30.4 pg (ref 26.0–34.0)
MCHC: 33.5 g/dL (ref 30.0–36.0)
MCHC: 33.2 g/dL (ref 30.0–36.0)
MCV: 88.2 fL (ref 78.0–100.0)
MCV: 91.4 fL (ref 78.0–100.0)
PLATELETS: 138 10*3/uL — AB (ref 150–400)
PLATELETS: 137 10*3/uL — AB (ref 150–400)
RBC: 3.55 MIL/uL — ABNORMAL LOW (ref 4.22–5.81)
RBC: 3.39 MIL/uL — ABNORMAL LOW (ref 4.22–5.81)
RDW: 13.1 % (ref 11.5–15.5)
RDW: 13.4 % (ref 11.5–15.5)
WBC: 10.3 10*3/uL (ref 4.0–10.5)
WBC: 15.1 10*3/uL — ABNORMAL HIGH (ref 4.0–10.5)

## 2013-12-19 LAB — BASIC METABOLIC PANEL
ANION GAP: 10 (ref 5–15)
BUN: 15 mg/dL (ref 6–23)
CALCIUM: 7.7 mg/dL — AB (ref 8.4–10.5)
CO2: 25 mEq/L (ref 19–32)
CREATININE: 1.2 mg/dL (ref 0.50–1.35)
Chloride: 107 mEq/L (ref 96–112)
GFR, EST AFRICAN AMERICAN: 66 mL/min — AB (ref >90)
GFR, EST NON AFRICAN AMERICAN: 57 mL/min — AB (ref >90)
Glucose, Bld: 136 mg/dL — ABNORMAL HIGH (ref 70–99)
Potassium: 3.6 mEq/L — ABNORMAL LOW (ref 3.7–5.3)
Sodium: 142 mEq/L (ref 137–147)

## 2013-12-19 LAB — MAGNESIUM
Magnesium: 2.1 mg/dL (ref 1.5–2.5)
Magnesium: 1.9 mg/dL (ref 1.5–2.5)

## 2013-12-19 LAB — CREATININE, SERUM
CREATININE: 1.74 mg/dL — AB (ref 0.50–1.35)
GFR calc non Af Amer: 36 mL/min — ABNORMAL LOW (ref >90)
GFR, EST AFRICAN AMERICAN: 42 mL/min — AB (ref >90)

## 2013-12-19 MED ORDER — POTASSIUM CHLORIDE 10 MEQ/50ML IV SOLN
10.0000 meq | INTRAVENOUS | Status: AC | PRN
  Administered 2013-12-19 (×3): 10 meq via INTRAVENOUS

## 2013-12-19 MED ORDER — DEXTROSE 50 % IV SOLN
INTRAVENOUS | Status: AC
  Administered 2013-12-19: 14 mL
  Filled 2013-12-19: qty 50

## 2013-12-19 MED ORDER — METOCLOPRAMIDE HCL 5 MG/ML IJ SOLN
10.0000 mg | Freq: Four times a day (QID) | INTRAMUSCULAR | Status: AC
  Administered 2013-12-19 – 2013-12-20 (×4): 10 mg via INTRAVENOUS
  Filled 2013-12-19: qty 2

## 2013-12-19 MED ORDER — LEVOTHYROXINE SODIUM 100 MCG IV SOLR
56.0000 ug | Freq: Once | INTRAVENOUS | Status: AC
  Administered 2013-12-19: 56 ug via INTRAVENOUS
  Filled 2013-12-19: qty 5

## 2013-12-19 MED ORDER — INSULIN ASPART 100 UNIT/ML ~~LOC~~ SOLN: 0.0000 [IU] | SUBCUTANEOUS | Status: DC

## 2013-12-19 MED ORDER — INSULIN ASPART 100 UNIT/ML ~~LOC~~ SOLN
0.0000 [IU] | SUBCUTANEOUS | Status: DC
  Administered 2013-12-19 (×2): 2 [IU] via SUBCUTANEOUS
  Administered 2013-12-20: 4 [IU] via SUBCUTANEOUS
  Administered 2013-12-20 (×3): 2 [IU] via SUBCUTANEOUS
  Administered 2013-12-20: 4 [IU] via SUBCUTANEOUS
  Administered 2013-12-21 (×2): 2 [IU] via SUBCUTANEOUS

## 2013-12-19 MED ORDER — LEVOTHYROXINE SODIUM 112 MCG PO TABS
112.0000 ug | ORAL_TABLET | Freq: Every day | ORAL | Status: DC
  Administered 2013-12-20 – 2013-12-24 (×5): 112 ug via ORAL
  Filled 2013-12-19 (×6): qty 1

## 2013-12-19 MED ORDER — ENOXAPARIN SODIUM 40 MG/0.4ML ~~LOC~~ SOLN
40.0000 mg | Freq: Every day | SUBCUTANEOUS | Status: DC
  Administered 2013-12-19 – 2013-12-23 (×5): 40 mg via SUBCUTANEOUS
  Filled 2013-12-19 (×6): qty 0.4

## 2013-12-19 MED FILL — Heparin Sodium (Porcine) Inj 1000 Unit/ML: INTRAMUSCULAR | Qty: 10 | Status: AC

## 2013-12-19 MED FILL — Lidocaine HCl IV Inj 20 MG/ML: INTRAVENOUS | Qty: 5 | Status: AC

## 2013-12-19 MED FILL — Magnesium Sulfate Inj 50%: INTRAMUSCULAR | Qty: 10 | Status: AC

## 2013-12-19 MED FILL — Sodium Chloride IV Soln 0.9%: INTRAVENOUS | Qty: 2000 | Status: AC

## 2013-12-19 MED FILL — Heparin Sodium (Porcine) Inj 1000 Unit/ML: INTRAMUSCULAR | Qty: 30 | Status: AC

## 2013-12-19 MED FILL — Potassium Chloride Inj 2 mEq/ML: INTRAVENOUS | Qty: 40 | Status: AC

## 2013-12-19 MED FILL — Electrolyte-R (PH 7.4) Solution: INTRAVENOUS | Qty: 4000 | Status: AC

## 2013-12-19 MED FILL — Mannitol IV Soln 20%: INTRAVENOUS | Qty: 500 | Status: AC

## 2013-12-19 MED FILL — Sodium Bicarbonate IV Soln 8.4%: INTRAVENOUS | Qty: 50 | Status: AC

## 2013-12-19 NOTE — Progress Notes (Signed)
Inpatient Diabetes Program Recommendations  AACE/ADA: New Consensus Statement on Inpatient Glycemic Control (2013)  Target Ranges:  Prepandial:   less than 140 mg/dL      Peak postprandial:   less than 180 mg/dL (1-2 hours)      Critically ill patients:  140 - 180 mg/dL   Reason for Visit: Note elevated A1C=6.7%.  No previous history of diabetes noted.  ?  New diagnosis.  If so will need basic education and follow-up with PCP.    Will follow.  Adah Perl, RN, BC-ADM Inpatient Diabetes Coordinator Pager [phone]

## 2013-12-19 NOTE — Anesthesia Postprocedure Evaluation (Signed)
  Anesthesia Post-op Note  Patient: [name] [name]  Procedure(s) Performed: Procedure(s): Coronary artery bypass grafting times four using left internal mammary artery and endoscopically harvested right saphenous vein graft (N/A)  Patient Location: SICU  Anesthesia Type:General  Level of Consciousness: sedated, unresponsive and Patient remains intubated per anesthesia plan  Airway and Oxygen Therapy: Patient remains intubated per anesthesia plan and Patient placed on Ventilator (see vital sign flow sheet for setting)  Post-op Pain: none  Post-op Assessment: Post-op Vital signs reviewed and Patient's Cardiovascular Status Stable  Post-op Vital Signs: stable  Last Vitals:  Filed Vitals:   12/19/13 0745  BP:   Pulse: 80  Temp: 36.8 C  Resp: 15    Complications: No apparent anesthesia complications

## 2013-12-19 NOTE — Progress Notes (Signed)
*  PRELIMINARY RESULTS* Echocardiogram Or tee has been performed.  Leavy Cella 12/19/2013, 10:25 AM

## 2013-12-19 NOTE — Progress Notes (Addendum)
1 Day Post-Op Procedure(s) (LRB): Coronary artery bypass grafting times four using left internal mammary artery and endoscopically harvested right saphenous vein graft (N/A) Subjective:  Still intubated but awake and wants the tube out  Objective: Vital signs in last 24 hours: Temp:  [95.5 F (35.3 C)-98.4 F (36.9 C)] 98.4 F (36.9 C) (09/11 0800) Pulse Rate:  [53-97] 80 (09/11 0800) Cardiac Rhythm:  [-] Normal sinus rhythm (09/11 0730) Resp:  [12-25] 12 (09/11 0800) BP: (78-148)/(36-87) 96/62 mmHg (09/11 0741) SpO2:  [93 %-100 %] 96 % (09/11 0800) Arterial Line BP: (92-173)/(50-90) 114/64 mmHg (09/11 0800) FiO2 (%):  [40 %-60 %] 40 % (09/11 0800) Weight:  [112.719 kg (248 lb 8 oz)] 112.719 kg (248 lb 8 oz) (09/11 0500)  Hemodynamic parameters for last 24 hours: PAP: (21-38)/(10-24) 38/24 mmHg CO:  [3.4 L/min-4.7 L/min] 4.3 L/min CI:  [1.8 L/min/m2-2.1 L/min/m2] 1.9 L/min/m2  Intake/Output from previous day: 09/10 0701 - 09/11 0700 In: 5390 [I.V.:3850; Blood:350; NG/GT:90; IV Piggyback:1100] Out: 4505 [Urine:3555; Blood:700; Chest Tube:250] Intake/Output this shift: Total I/O In: 30 [NG/GT:30] Out: 60 [Urine:60]  General appearance: alert and cooperative Neurologic: intact Heart: regular rate and rhythm, S1, S2 normal, no murmur, click, rub or gallop Lungs: clear to auscultation bilaterally Extremities: edema mild Wound: dressings dry  Lab Results:  Recent Labs  12/18/13 2030 12/19/13 0400  WBC 21.2* 10.3  HGB 11.5*  11.6* 10.5*  HCT 34.5*  34.0* 31.3*  PLT 147* 138*   BMET:  Recent Labs  12/17/13 2245 12/17/13 2326 12/18/13 2030 12/19/13 0400  NA 140 139 140 142  K 4.1 3.6* 3.4* 3.6*  CL 104 106  --  107  CO2 24  --   --  25  GLUCOSE 164* 171* 167* 136*  BUN 25* 23  --  15  CREATININE 1.74* 1.70*  --  1.20  CALCIUM 9.0  --   --  7.7*    PT/INR:  Recent Labs  12/18/13 2030  LABPROT 17.3*  INR 1.41   ABG    Component Value Date/Time   PHART 7.368 12/19/2013 0405   HCO3 26.0* 12/19/2013 0405   TCO2 27 12/19/2013 0405   ACIDBASEDEF 7.0* 12/18/2013 2207   O2SAT 98.0 12/19/2013 0405   CBG (last 3)   Recent Labs  12/19/13 0402 12/19/13 0501 12/19/13 0659  GLUCAP 131* 108* 99    Assessment/Plan: S/P Procedure(s) (LRB): Coronary artery bypass grafting times four using left internal mammary artery and endoscopically harvested right saphenous vein graft (N/A) He is hemodynamically stable on low dose dopamine and neo Hold beta blocker until stable off vasopressors. Extubate and wean drips as tolerated Stage III CKD: creat was 1.7 preop. Continue to follow. Diabetes control: Hgb A1c was 6.8 preop. Continue SSI d/c tubes/lines Continue foley due to patient in ICU and urinary output monitoring See progression orders   LOS: 2 days    BARTLE,BRYAN K 12/19/2013

## 2013-12-19 NOTE — Procedures (Signed)
Extubation Procedure Note  Patient Details:   Name: [name] [name] DOB: November 25, 1933 MRN: [id number]   Airway Documentation:     Evaluation  O2 sats: stable throughout Complications: No apparent complications Patient did tolerate procedure well. Bilateral Breath Sounds: Clear   Yes Pt extubated to a 4lpm [place], vitals as noted. NIF-29, VC 645ml  Cordella Register 12/19/2013, 8:19 AM

## 2013-12-19 NOTE — Progress Notes (Signed)
Patient ID: [name] [name], male   DOB: 12-Apr-1933, 80 y.o.   MRN: [id number] EVENING ROUNDS NOTE :     [address]       [place] [address]             [phone]                 1 Day Post-Op Procedure(s) (LRB): Coronary artery bypass grafting times four using left internal mammary artery and endoscopically harvested right saphenous vein graft (N/A)  Total Length of Stay:  LOS: 2 days  BP 90/27  Pulse 89  Temp(Src) 98 F (36.7 C) (Oral)  Resp 15  Ht 5' 10.5" (1.791 m)  Wt 248 lb 8 oz (112.719 kg)  BMI 35.14 kg/m2  SpO2 94%  .Intake/Output     09/11 0701 - 09/12 0700   P.O. 300   I.V. (mL/kg) 437 (3.9)   Blood    NG/GT    IV Piggyback 100   Total Intake(mL/kg) 837 (7.4)   Urine (mL/kg/hr) 350 (0.3)   Blood    Chest Tube 100 (0.1)   Total Output 450   Net +387         . sodium chloride 20 mL/hr at 12/19/13 0400  . sodium chloride 20 mL/hr (12/18/13 2100)  . sodium chloride    . DOPamine 3 mcg/kg/min (12/19/13 0800)  . lactated ringers 20 mL/hr at 12/19/13 0400  . nitroGLYCERIN Stopped (12/18/13 2120)  . phenylephrine (NEO-SYNEPHRINE) Adult infusion Stopped (12/19/13 1700)     Lab Results  Component Value Date   WBC 15.1* 12/19/2013   HGB 10.9* 12/19/2013   HCT 32.0* 12/19/2013   PLT 137* 12/19/2013   GLUCOSE 171* 12/19/2013   ALT 9 12/17/2013   AST 22 12/17/2013   NA 140 12/19/2013   K 4.4 12/19/2013   CL 106 12/19/2013   CREATININE 1.80* 12/19/2013   BUN 24* 12/19/2013   CO2 25 12/19/2013   INR 1.41 12/18/2013   HGBA1C 6.7* 12/18/2013     Grace Isaac MD  Beeper [phone] Office [phone] 12/19/2013 7:07 PM

## 2013-12-20 ENCOUNTER — Inpatient Hospital Stay (HOSPITAL_COMMUNITY): Payer: Medicare Other

## 2013-12-20 LAB — POCT I-STAT 3, ART BLOOD GAS (G3+)
Acid-Base Excess: 1 mmol/L (ref 0.0–2.0)
Bicarbonate: 26.9 mEq/L — ABNORMAL HIGH (ref 20.0–24.0)
O2 Saturation: 96 %
PCO2 ART: 45.2 mmHg — AB (ref 35.0–45.0)
PH ART: 7.382 (ref 7.350–7.450)
Patient temperature: 36.8
TCO2: 28 mmol/L (ref 0–100)
pO2, Arterial: 84 mmHg (ref 80.0–100.0)

## 2013-12-20 LAB — GLUCOSE, CAPILLARY
GLUCOSE-CAPILLARY: 169 mg/dL — AB (ref 70–99)
Glucose-Capillary: 115 mg/dL — ABNORMAL HIGH (ref 70–99)
Glucose-Capillary: 143 mg/dL — ABNORMAL HIGH (ref 70–99)
Glucose-Capillary: 143 mg/dL — ABNORMAL HIGH (ref 70–99)
Glucose-Capillary: 168 mg/dL — ABNORMAL HIGH (ref 70–99)

## 2013-12-20 LAB — BASIC METABOLIC PANEL
Anion gap: 12 (ref 5–15)
BUN: 23 mg/dL (ref 6–23)
CO2: 24 mEq/L (ref 19–32)
CREATININE: 1.58 mg/dL — AB (ref 0.50–1.35)
Calcium: 8.3 mg/dL — ABNORMAL LOW (ref 8.4–10.5)
Chloride: 107 mEq/L (ref 96–112)
GFR, EST AFRICAN AMERICAN: 47 mL/min — AB (ref >90)
GFR, EST NON AFRICAN AMERICAN: 41 mL/min — AB (ref >90)
Glucose, Bld: 142 mg/dL — ABNORMAL HIGH (ref 70–99)
POTASSIUM: 4.2 meq/L (ref 3.7–5.3)
Sodium: 143 mEq/L (ref 137–147)

## 2013-12-20 LAB — CBC
HCT: 27.8 % — ABNORMAL LOW (ref 39.0–52.0)
Hemoglobin: 9.2 g/dL — ABNORMAL LOW (ref 13.0–17.0)
MCH: 29.2 pg (ref 26.0–34.0)
MCHC: 33.1 g/dL (ref 30.0–36.0)
MCV: 88.3 fL (ref 78.0–100.0)
PLATELETS: 120 10*3/uL — AB (ref 150–400)
RBC: 3.15 MIL/uL — ABNORMAL LOW (ref 4.22–5.81)
RDW: 13.5 % (ref 11.5–15.5)
WBC: 12.8 10*3/uL — ABNORMAL HIGH (ref 4.0–10.5)

## 2013-12-20 MED ORDER — FUROSEMIDE 10 MG/ML IJ SOLN
40.0000 mg | Freq: Once | INTRAMUSCULAR | Status: AC
  Administered 2013-12-20: 40 mg via INTRAVENOUS
  Filled 2013-12-20: qty 4

## 2013-12-20 NOTE — Progress Notes (Signed)
Patient ID: [name] [name], male   DOB: 08/30/1933, 80 y.o.   MRN: [id number] EVENING ROUNDS NOTE :     [address]       [place] [address]             [phone]                 2 Days Post-Op Procedure(s) (LRB): Coronary artery bypass grafting times four using left internal mammary artery and endoscopically harvested right saphenous vein graft (N/A)  Total Length of Stay:  LOS: 3 days  BP 124/109  Pulse 85  Temp(Src) 99.4 F (37.4 C) (Oral)  Resp 22  Ht 5' 10.5" (1.791 m)  Wt 249 lb 5.4 oz (113.1 kg)  BMI 35.26 kg/m2  SpO2 94%  .Intake/Output     09/11 0701 - 09/12 0700 09/12 0701 - 09/13 0700   P.O. 420 360   I.V. (mL/kg) 777.6 (6.9) 176.4 (1.6)   Blood     NG/GT     IV Piggyback 100 50   Total Intake(mL/kg) 1297.6 (11.5) 586.4 (5.2)   Urine (mL/kg/hr) 1230 (0.5) 760 (0.7)   Blood     Chest Tube 100 (0)    Total Output 1330 760   Net -32.4 -173.6          . sodium chloride 20 mL/hr at 12/19/13 0400  . sodium chloride 20 mL/hr at 12/20/13 0400  . sodium chloride    . DOPamine 2.5 mcg/kg/min (12/20/13 1200)  . lactated ringers Stopped (12/19/13 2000)  . phenylephrine (NEO-SYNEPHRINE) Adult infusion Stopped (12/19/13 1700)     Lab Results  Component Value Date   WBC 12.8* 12/20/2013   HGB 9.2* 12/20/2013   HCT 27.8* 12/20/2013   PLT 120* 12/20/2013   GLUCOSE 142* 12/20/2013   ALT 9 12/17/2013   AST 22 12/17/2013   NA 143 12/20/2013   K 4.2 12/20/2013   CL 107 12/20/2013   CREATININE 1.58* 12/20/2013   BUN 23 12/20/2013   CO2 24 12/20/2013   INR 1.41 12/18/2013   HGBA1C 6.7* 12/18/2013   Better day today , less confused  Grace Isaac MD  Beeper [phone] Office [phone] 12/20/2013 5:06 PM

## 2013-12-20 NOTE — Progress Notes (Signed)
Patient ID: [name] [name], male   DOB: December 03, 1933, 80 y.o.   MRN: [id number] TCTS DAILY ICU PROGRESS NOTE                   [address]            [place] [address]          [phone]   2 Days Post-Op Procedure(s) (LRB): Coronary artery bypass grafting times four using left internal mammary artery and endoscopically harvested right saphenous vein graft (N/A)  Total Length of Stay:  LOS: 3 days   Subjective: Alert  Up in chair   Objective: Vital signs in last 24 hours: Temp:  [98 F (36.7 C)-99 F (37.2 C)] 98.5 F (36.9 C) (09/12 1100) Pulse Rate:  [79-102] 102 (09/12 1000) Cardiac Rhythm:  [-] Normal sinus rhythm (09/12 0800) Resp:  [11-30] 19 (09/12 1000) BP: (88-142)/(27-114) 104/84 mmHg (09/12 1000) SpO2:  [91 %-100 %] 93 % (09/12 1000) Arterial Line BP: (75-135)/(39-90) 126/55 mmHg (09/12 1000) Weight:  [249 lb 5.4 oz (113.1 kg)] 249 lb 5.4 oz (113.1 kg) (09/12 0500)  Filed Weights   12/18/13 0100 12/19/13 0500 12/20/13 0500  Weight: 243 lb 2.7 oz (110.3 kg) 248 lb 8 oz (112.719 kg) 249 lb 5.4 oz (113.1 kg)    Weight change: 13.4 oz (0.381 kg)   Hemodynamic parameters for last 24 hours:    Intake/Output from previous day: 09/11 0701 - 09/12 0700 In: 1297.6 [P.O.:420; I.V.:777.6; IV Piggyback:100] Out: 1330 [Urine:1230; Chest Tube:100]  Intake/Output this shift: Total I/O In: 243.6 [P.O.:120; I.V.:73.6; IV Piggyback:50] Out: 160 [Urine:160]  Current Meds: Scheduled Meds: . acetaminophen  1,000 mg Oral Q6H   Or  . acetaminophen (TYLENOL) oral liquid 160 mg/5 mL  1,000 mg Per Tube Q6H  . antiseptic oral rinse  7 mL Mouth Rinse QID  . aspirin EC  325 mg Oral Daily   Or  . aspirin  324 mg Per Tube Daily  . bisacodyl  10 mg Oral Daily   Or  . bisacodyl  10 mg Rectal Daily  . cefUROXime (ZINACEF)  IV  1.5 g Intravenous Q12H  . chlorhexidine  15 mL Mouth Rinse BID  . docusate sodium  200 mg Oral Daily  . donepezil  10 mg Oral QHS  .  enoxaparin (LOVENOX) injection  40 mg Subcutaneous QHS  . insulin aspart  0-24 Units Subcutaneous 6 times per day  . levothyroxine  112 mcg Oral QAC breakfast  . pantoprazole  40 mg Oral Daily  . sodium chloride  3 mL Intravenous Q12H   Continuous Infusions: . sodium chloride 20 mL/hr at 12/19/13 0400  . sodium chloride 20 mL/hr at 12/20/13 0400  . sodium chloride    . DOPamine 3 mcg/kg/min (12/20/13 0400)  . lactated ringers Stopped (12/19/13 2000)  . nitroGLYCERIN Stopped (12/18/13 2120)  . phenylephrine (NEO-SYNEPHRINE) Adult infusion Stopped (12/19/13 1700)   PRN Meds:.metoprolol, morphine injection, ondansetron (ZOFRAN) IV, oxyCODONE, sodium chloride  General appearance: alert and cooperative Neurologic: intact Heart: regular rate and rhythm, S1, S2 normal, no murmur, click, rub or gallop Lungs: diminished breath sounds bibasilar Abdomen: soft, non-tender; bowel sounds normal; no masses,  no organomegaly Extremities: extremities normal, atraumatic, no cyanosis or edema and Homans sign is negative, no sign of DVT Wound: sternum stable  Lab Results: CBC: Recent Labs  12/19/13 1630 12/19/13 1632 12/20/13 0426  WBC 15.1*  --  12.8*  HGB 10.3* 10.9* 9.2*  HCT 31.0* 32.0* 27.8*  PLT 137*  --  120*   BMET:  Recent Labs  12/19/13 0400  12/19/13 1632 12/20/13 0426  NA 142  --  140 143  K 3.6*  --  4.4 4.2  CL 107  --  106 107  CO2 25  --   --  24  GLUCOSE 136*  --  171* 142*  BUN 15  --  24* 23  CREATININE 1.20  < > 1.80* 1.58*  CALCIUM 7.7*  --   --  8.3*  < > = values in this interval not displayed.  PT/INR:  Recent Labs  12/18/13 2030  LABPROT 17.3*  INR 1.41   Radiology: Dg Chest Port 1 View  12/20/2013   CLINICAL DATA:  CABG 12/18/2013  EXAM: PORTABLE CHEST - 1 VIEW  COMPARISON:  12/19/2013  FINDINGS: Enlarged cardiac/mediastinal contours, stable. Right internal jugular vascular sheath is the only remaining support device detected. There is mild vascular  congestion. No evidence of pulmonary edema. Left lower lobe opacity similar to prior study likely atelectasis.  IMPRESSION: Anticipated postoperative appearance with mild vascular congestion and left lower lobe atelectasis   Electronically Signed   By: Skipper Cliche M.D.   On: 12/20/2013 07:23   Dg Chest Portable 1 View In Am  12/19/2013   CLINICAL DATA:  Hypoxia  EXAM: PORTABLE CHEST - 1 VIEW  COMPARISON:  December 18, 2013  FINDINGS: Endotracheal tube tip is 4.4 cm above the carina. Nasogastric tube tip is in the stomach with the side port essentially at the gastroesophageal junction level. The Swan-Ganz catheter tip is in the main pulmonary outflow tract directed slightly toward the right. There is a left chest tube and a mediastinal drain. No pneumothorax. There is mild left base atelectasis. Lungs elsewhere clear. Heart is mildly enlarged with pulmonary vascularity within normal limits.  IMPRESSION: Tube and catheter positions as described without appreciable pneumothorax. Note that the side port of the nasogastric tube is essentially at the gastroesophageal junction. There is mild left base atelectasis. Lungs elsewhere are clear. There is no appreciable change in cardiac silhouette.   Electronically Signed   By: Lowella Grip M.D.   On: 12/19/2013 07:47   Dg Chest Portable 1 View  12/18/2013   CLINICAL DATA:  Status post median sternotomy  EXAM: PORTABLE CHEST - 1 VIEW  COMPARISON:  12/18/2013  FINDINGS: Cardiac shadow is mildly enlarged. Changes of median sternotomy are now seen. A mediastinal drain, Swan-Ganz catheter, left thoracostomy catheter and nasogastric catheter are seen. No pneumothorax is noted. An endotracheal tube is seen 5.1 cm above the carina. No focal infiltrate or sizable effusion is seen.  IMPRESSION: Tubes and lines as described without acute abnormality.   Electronically Signed   By: Inez Catalina M.D.   On: 12/18/2013 20:57   Chronic Kidney Disease   Stage I     GFR  >90  Stage II    GFR 60-89  Stage IIIA GFR 45-59  Stage IIIB GFR 30-44  Stage IV   GFR 15-29  Stage V    GFR  <15  Lab Results  Component Value Date   CREATININE 1.58* 12/20/2013   Estimated Creatinine Clearance: 49.7 ml/min (by C-G formula based on Cr of 1.58).   Assessment/Plan: S/P Procedure(s) (LRB): Coronary artery bypass grafting times four using left internal mammary artery and endoscopically harvested right saphenous vein graft (N/A) Mobilize Diuresis d/c tubes/lines Stage IIIa chonic renal disease  Much less confused  Deisi Salonga B 12/20/2013 11:57 AM

## 2013-12-21 ENCOUNTER — Inpatient Hospital Stay (HOSPITAL_COMMUNITY): Payer: Medicare Other

## 2013-12-21 LAB — GLUCOSE, CAPILLARY
GLUCOSE-CAPILLARY: 158 mg/dL — AB (ref 70–99)
GLUCOSE-CAPILLARY: 129 mg/dL — AB (ref 70–99)
Glucose-Capillary: 116 mg/dL — ABNORMAL HIGH (ref 70–99)
Glucose-Capillary: 123 mg/dL — ABNORMAL HIGH (ref 70–99)
Glucose-Capillary: 113 mg/dL — ABNORMAL HIGH (ref 70–99)
Glucose-Capillary: 138 mg/dL — ABNORMAL HIGH (ref 70–99)
Glucose-Capillary: 130 mg/dL — ABNORMAL HIGH (ref 70–99)

## 2013-12-21 LAB — BASIC METABOLIC PANEL
Anion gap: 11 (ref 5–15)
BUN: 26 mg/dL — ABNORMAL HIGH (ref 6–23)
CO2: 26 mEq/L (ref 19–32)
Calcium: 8.5 mg/dL (ref 8.4–10.5)
Chloride: 103 mEq/L (ref 96–112)
Creatinine, Ser: 1.27 mg/dL (ref 0.50–1.35)
GFR calc Af Amer: 61 mL/min — ABNORMAL LOW (ref >90)
GFR calc non Af Amer: 53 mL/min — ABNORMAL LOW (ref >90)
Glucose, Bld: 116 mg/dL — ABNORMAL HIGH (ref 70–99)
Potassium: 3.6 mEq/L — ABNORMAL LOW (ref 3.7–5.3)
Sodium: 140 mEq/L (ref 137–147)

## 2013-12-21 LAB — CBC
HCT: 26.8 % — ABNORMAL LOW (ref 39.0–52.0)
Hemoglobin: 9 g/dL — ABNORMAL LOW (ref 13.0–17.0)
MCH: 29.6 pg (ref 26.0–34.0)
MCHC: 33.6 g/dL (ref 30.0–36.0)
MCV: 88.2 fL (ref 78.0–100.0)
Platelets: 132 10*3/uL — ABNORMAL LOW (ref 150–400)
RBC: 3.04 MIL/uL — ABNORMAL LOW (ref 4.22–5.81)
RDW: 13.7 % (ref 11.5–15.5)
WBC: 10.8 10*3/uL — ABNORMAL HIGH (ref 4.0–10.5)

## 2013-12-21 MED ORDER — FUROSEMIDE 10 MG/ML IJ SOLN
40.0000 mg | Freq: Once | INTRAMUSCULAR | Status: AC
  Administered 2013-12-21: 40 mg via INTRAVENOUS
  Filled 2013-12-21: qty 4

## 2013-12-21 MED ORDER — POTASSIUM CHLORIDE 10 MEQ/50ML IV SOLN
10.0000 meq | INTRAVENOUS | Status: AC | PRN
  Administered 2013-12-21 (×3): 10 meq via INTRAVENOUS
  Filled 2013-12-21 (×2): qty 50

## 2013-12-21 NOTE — Progress Notes (Signed)
Patient ID: [name] [name], male   DOB: [birth year], 80 y.o.   MRN: [id number] TCTS DAILY ICU PROGRESS NOTE                   [address]            [place] [address]          [phone]   3 Days Post-Op Procedure(s) (LRB): Coronary artery bypass grafting times four using left internal mammary artery and endoscopically harvested right saphenous vein graft (N/A)  Total Length of Stay:  LOS: 4 days   Subjective: Confused during the night, cooperative this am but still confused   Objective: Vital signs in last 24 hours: Temp:  [98.2 F (36.8 C)-99.4 F (37.4 C)] 98.7 F (37.1 C) (09/13 0700) Pulse Rate:  [76-102] 79 (09/13 0800) Cardiac Rhythm:  [-] Normal sinus rhythm (09/13 0800) Resp:  [13-30] 16 (09/13 0800) BP: (76-132)/(46-109) 107/84 mmHg (09/13 0600) SpO2:  [93 %-98 %] 95 % (09/13 0800) Arterial Line BP: (87-126)/(42-58) 87/42 mmHg (09/12 1300) Weight:  [248 lb 14.4 oz (112.9 kg)] 248 lb 14.4 oz (112.9 kg) (09/13 0700)  Filed Weights   12/19/13 0500 12/20/13 0500 12/21/13 0700  Weight: 248 lb 8 oz (112.719 kg) 249 lb 5.4 oz (113.1 kg) 248 lb 14.4 oz (112.9 kg)    Weight change: -7.1 oz (-0.2 kg)   Hemodynamic parameters for last 24 hours:    Intake/Output from previous day: 09/12 0701 - 09/13 0700 In: 1214.8 [P.O.:480; I.V.:584.8; IV Piggyback:150] Out: 2345 [Urine:2345]  Intake/Output this shift: Total I/O In: 55.2 [I.V.:5.2; IV Piggyback:50] Out: 125 [Urine:125]  Current Meds: Scheduled Meds: . acetaminophen  1,000 mg Oral Q6H   Or  . acetaminophen (TYLENOL) oral liquid 160 mg/5 mL  1,000 mg Per Tube Q6H  . antiseptic oral rinse  7 mL Mouth Rinse QID  . aspirin EC  325 mg Oral Daily   Or  . aspirin  324 mg Per Tube Daily  . bisacodyl  10 mg Oral Daily   Or  . bisacodyl  10 mg Rectal Daily  . chlorhexidine  15 mL Mouth Rinse BID  . docusate sodium  200 mg Oral Daily  . donepezil  10 mg Oral QHS  . enoxaparin (LOVENOX) injection   40 mg Subcutaneous QHS  . insulin aspart  0-24 Units Subcutaneous 6 times per day  . levothyroxine  112 mcg Oral QAC breakfast  . pantoprazole  40 mg Oral Daily  . sodium chloride  3 mL Intravenous Q12H   Continuous Infusions: . sodium chloride 20 mL/hr at 12/19/13 0400  . sodium chloride 20 mL/hr at 12/20/13 0400  . sodium chloride    . DOPamine 2.5 mcg/kg/min (12/21/13 0800)  . lactated ringers Stopped (12/19/13 2000)  . phenylephrine (NEO-SYNEPHRINE) Adult infusion Stopped (12/19/13 1700)   PRN Meds:.metoprolol, morphine injection, ondansetron (ZOFRAN) IV, oxyCODONE, sodium chloride  General appearance: alert, cooperative and confused Neurologic: intact and no laterlizing signs Heart: regular rate and rhythm, S1, S2 normal, no murmur, click, rub or gallop Lungs: diminished breath sounds bibasilar Abdomen: soft, non-tender; bowel sounds normal; no masses,  no organomegaly Extremities: extremities normal, atraumatic, no cyanosis or edema and Homans sign is negative, no sign of DVT Wound: sternum stable  Lab Results: CBC: Recent Labs  12/20/13 0426 12/21/13 0400  WBC 12.8* 10.8*  HGB 9.2* 9.0*  HCT 27.8* 26.8*  PLT 120* 132*   BMET:  Recent Labs  12/20/13 0426  12/21/13 0400  NA 143 140  K 4.2 3.6*  CL 107 103  CO2 24 26  GLUCOSE 142* 116*  BUN 23 26*  CREATININE 1.58* 1.27  CALCIUM 8.3* 8.5    PT/INR:  Recent Labs  12/18/13 2030  LABPROT 17.3*  INR 1.41   Radiology: Dg Chest Port 1 View  12/21/2013   CLINICAL DATA:  Postoperative evaluation.  EXAM: PORTABLE CHEST - 1 VIEW  COMPARISON:  Chest x-ray 12/20/2013.  FINDINGS: Right internal jugular Cordis with tip terminating in the proximal superior vena cava. Lung volumes are slightly low. Persistent opacity in the left base is favored to reflect some subsegmental atelectasis, and probable trace left pleural effusion. Right lung is clear. No evidence of pulmonary edema. Heart size is borderline enlarged, but  likely accentuated by low lung volumes and portable AP technique. Upper mediastinal contours are within normal limits. Atherosclerosis in the thoracic aorta. Status post median sternotomy.  IMPRESSION: 1. Support apparatus and postoperative changes, as above. 2. Minimal subsegmental atelectasis in the left lower lobe with trace left pleural effusion.   Electronically Signed   By: Vinnie Langton M.D.   On: 12/21/2013 08:45   Dg Chest Port 1 View  12/20/2013   CLINICAL DATA:  CABG 12/18/2013  EXAM: PORTABLE CHEST - 1 VIEW  COMPARISON:  12/19/2013  FINDINGS: Enlarged cardiac/mediastinal contours, stable. Right internal jugular vascular sheath is the only remaining support device detected. There is mild vascular congestion. No evidence of pulmonary edema. Left lower lobe opacity similar to prior study likely atelectasis.  IMPRESSION: Anticipated postoperative appearance with mild vascular congestion and left lower lobe atelectasis   Electronically Signed   By: Skipper Cliche M.D.   On: 12/20/2013 07:23     Assessment/Plan: S/P Procedure(s) (LRB): Coronary artery bypass grafting times four using left internal mammary artery and endoscopically harvested right saphenous vein graft (N/A) Mobilize Renal function improving Wean off dopamine today  D/c foley Keep in icu with sitter     [name] [name] 12/21/2013 9:37 AM

## 2013-12-22 ENCOUNTER — Inpatient Hospital Stay (HOSPITAL_COMMUNITY): Payer: Medicare Other

## 2013-12-22 ENCOUNTER — Encounter (HOSPITAL_COMMUNITY): Payer: Self-pay | Admitting: Surgery

## 2013-12-22 LAB — GLUCOSE, CAPILLARY
GLUCOSE-CAPILLARY: 93 mg/dL (ref 70–99)
GLUCOSE-CAPILLARY: 118 mg/dL — AB (ref 70–99)
GLUCOSE-CAPILLARY: 116 mg/dL — AB (ref 70–99)
Glucose-Capillary: 106 mg/dL — ABNORMAL HIGH (ref 70–99)
Glucose-Capillary: 106 mg/dL — ABNORMAL HIGH (ref 70–99)
Glucose-Capillary: 87 mg/dL (ref 70–99)
Glucose-Capillary: 96 mg/dL (ref 70–99)

## 2013-12-22 LAB — CBC
HCT: 23.2 % — ABNORMAL LOW (ref 39.0–52.0)
HCT: 25.7 % — ABNORMAL LOW (ref 39.0–52.0)
Hemoglobin: 7.8 g/dL — ABNORMAL LOW (ref 13.0–17.0)
Hemoglobin: 8.5 g/dL — ABNORMAL LOW (ref 13.0–17.0)
MCH: 29.5 pg (ref 26.0–34.0)
MCH: 29.3 pg (ref 26.0–34.0)
MCHC: 33.6 g/dL (ref 30.0–36.0)
MCHC: 33.1 g/dL (ref 30.0–36.0)
MCV: 87.9 fL (ref 78.0–100.0)
MCV: 88.6 fL (ref 78.0–100.0)
PLATELETS: 174 10*3/uL (ref 150–400)
Platelets: 148 10*3/uL — ABNORMAL LOW (ref 150–400)
RBC: 2.64 MIL/uL — ABNORMAL LOW (ref 4.22–5.81)
RBC: 2.9 MIL/uL — ABNORMAL LOW (ref 4.22–5.81)
RDW: 14 % (ref 11.5–15.5)
RDW: 14.1 % (ref 11.5–15.5)
WBC: 8.1 10*3/uL (ref 4.0–10.5)
WBC: 9 10*3/uL (ref 4.0–10.5)

## 2013-12-22 LAB — BASIC METABOLIC PANEL
Anion gap: 10 (ref 5–15)
BUN: 29 mg/dL — ABNORMAL HIGH (ref 6–23)
CO2: 27 mEq/L (ref 19–32)
Calcium: 8.4 mg/dL (ref 8.4–10.5)
Chloride: 106 mEq/L (ref 96–112)
Creatinine, Ser: 1.25 mg/dL (ref 0.50–1.35)
GFR calc Af Amer: 62 mL/min — ABNORMAL LOW (ref >90)
GFR calc non Af Amer: 54 mL/min — ABNORMAL LOW (ref >90)
Glucose, Bld: 98 mg/dL (ref 70–99)
Potassium: 3.4 mEq/L — ABNORMAL LOW (ref 3.7–5.3)
Sodium: 143 mEq/L (ref 137–147)

## 2013-12-22 MED ORDER — POTASSIUM CHLORIDE 10 MEQ/50ML IV SOLN
10.0000 meq | INTRAVENOUS | Status: AC | PRN
  Administered 2013-12-22 (×3): 10 meq via INTRAVENOUS
  Filled 2013-12-22: qty 50

## 2013-12-22 MED ORDER — MIDODRINE HCL 5 MG PO TABS
5.0000 mg | ORAL_TABLET | Freq: Three times a day (TID) | ORAL | Status: DC
  Administered 2013-12-22 – 2013-12-24 (×6): 5 mg via ORAL
  Filled 2013-12-22 (×8): qty 1

## 2013-12-22 NOTE — Progress Notes (Signed)
4 Days Post-Op Procedure(s) (LRB): Coronary artery bypass grafting times four using left internal mammary artery and endoscopically harvested right saphenous vein graft (N/A) Subjective:  No complaints  Objective: Vital signs in last 24 hours: Temp:  [98.4 F (36.9 C)-99.4 F (37.4 C)] 98.7 F (37.1 C) (09/14 0744) Pulse Rate:  [54-85] 73 (09/14 0600) Cardiac Rhythm:  [-] Normal sinus rhythm (09/14 0600) Resp:  [15-25] 18 (09/14 0600) BP: (81-146)/(46-99) 127/59 mmHg (09/14 0600) SpO2:  [91 %-100 %] 96 % (09/14 0600) Weight:  [113.1 kg (249 lb 5.4 oz)] 113.1 kg (249 lb 5.4 oz) (09/14 0700)  Hemodynamic parameters for last 24 hours:    Intake/Output from previous day: 09/13 0701 - 09/14 0700 In: 792.4 [P.O.:480; I.V.:262.4; IV Piggyback:50] Out: 725 [Urine:725] Intake/Output this shift:    General appearance: alert and cooperative Neurologic: intact Heart: regular rate and rhythm, S1, S2 normal, no murmur, click, rub or gallop Lungs: clear to auscultation bilaterally Extremities: edema mild Wound: incisions ok  Lab Results:  Recent Labs  12/21/13 0400 12/22/13 0436  WBC 10.8* 8.1  HGB 9.0* 7.8*  HCT 26.8* 23.2*  PLT 132* 148*   BMET:  Recent Labs  12/21/13 0400 12/22/13 0436  NA 140 143  [name] 3.6* 3.4*  CL 103 106  CO2 26 27  GLUCOSE 116* 98  BUN 26* 29*  CREATININE 1.27 1.25  CALCIUM 8.5 8.4    PT/INR: No results found for this basename: LABPROT, INR,  in the last 72 hours ABG    Component Value Date/Time   PHART 7.388 12/19/2013 0931   HCO3 26.2* 12/19/2013 0931   TCO2 22 12/19/2013 1632   ACIDBASEDEF 7.0* 12/18/2013 2207   O2SAT 86.0 12/19/2013 0931   CBG (last 3)   Recent Labs  12/21/13 1929 12/22/13 0002 12/22/13 0405  GLUCAP 130* 106* 33    Assessment/Plan: S/P Procedure(s) (LRB): Coronary artery bypass grafting times four using left internal mammary artery and endoscopically harvested right saphenous vein graft (N/A) He is  hemodynamically stable Some postop confusion and history of mild dementia but he seems fine this am. Expected acute blood loss anemia: Hgb dropped a point from yesterday am with no visible blood loss. Will repeat in the am. Start iron. Mobilize Diuresis Diabetes control Plan for transfer to step-down: see transfer orders   LOS: 5 days    [name] [name],[name] [name] 12/22/2013

## 2013-12-22 NOTE — Progress Notes (Signed)
Sitting up on side of bed  No complaints  BP 121/67  Pulse 73  Temp(Src) 98 F (36.7 C) (Oral)  Resp 24  Ht 5' 10.5" (1.791 m)  Wt 249 lb 5.4 oz (113.1 kg)  BMI 35.26 kg/m2  SpO2 96%   Intake/Output Summary (Last 24 hours) at 12/22/13 1934 Last data filed at 12/22/13 1800  Gross per 24 hour  Intake   1010 ml  Output    300 ml  Net    710 ml    Doing well, follow BP

## 2013-12-22 NOTE — Progress Notes (Signed)
Patient ID: [name] [name], male   DOB: 06/15/1933, 80 y.o.   MRN: [id number]  Patient developed orthostatic hypotension today on two occassions, once into the 50's and then again to 85. He had been on Midodrine at home for this. Will resume this today. Keep in ICU today to follow BP. Hopefully can get to floor tomorrow.

## 2013-12-22 NOTE — Progress Notes (Signed)
Dr. Cyndia Bent paged and made aware of low blood pressure when standing patient (twice this morning). Blood pressured recovered after patient got back in the bed. Told to send down a cbc. He will come see patient before we transfer patient to [facility]. Will continue to monitor.  Sandre Kitty

## 2013-12-22 NOTE — Progress Notes (Signed)
OT Cancellation Note  Patient Details Name: [name] [name] MRN: [id number] DOB: 02/11/1934   Cancelled Treatment:    Reason Eval/Treat Not Completed: Other (comment). Spoke with nurse who wants pt to be left in bed until she sees MD due to BP.   Benito Mccreedy OTR/L [phone] 12/22/2013, 10:27 AM

## 2013-12-22 NOTE — Evaluation (Signed)
Physical Therapy Evaluation Patient Details Name: [name] [name] MRN: [id number] DOB: 1933/05/14 Today's Date: 12/22/2013   History of Present Illness  80 year old male with history of dyslipidemia, obstructive sleep apnea and hypothyroidism with STEMI now s/p CABG. Pt with AMS post-op  Clinical Impression  Pt pleasant and alert and oriented on arrival. With standing pt noted dizziness with hypotension sustained and progressive with decline in cognition in standing with vitals below. Pt states he lives alone and was independent. Pt educated for sternal precautions, transfers and mobility. Pt will benefit from acute therapy to maximize mobility, safety, gait and function adhering to precautions to decrease burden of care.     Follow Up Recommendations SNF;Supervision/Assistance - 24 hour    Equipment Recommendations  Rolling walker with 5" wheels    Recommendations for Other Services OT consult     Precautions / Restrictions Precautions Precautions: Sternal;Fall Precaution Comments: watch BP      Mobility  Bed Mobility               General bed mobility comments: pt in recliner on arrival  Transfers Overall transfer level: Needs assistance   Transfers: Sit to/from Stand Sit to Stand: Min assist         General transfer comment: cues for hand placement and sequence with assist to maintain precautions and achieve anterior translation x 2 trials limited mobility by hypotension  Ambulation/Gait Ambulation/Gait assistance:  (unable to attempt today)              Stairs            Wheelchair Mobility    Modified Rankin (Stroke Patients Only)       Balance Overall balance assessment: Needs assistance   Sitting balance-Leahy Scale: Fair       Standing balance-Leahy Scale: Fair                               Pertinent Vitals/Pain Pain Assessment: No/denies pain Orthostatic BPs  sitting 140/69, HR 72  Standing 0 min 73/39 (47)   Standing 3 min 59/36 (41)  Return to sitting 78/46 0 min, with progression to 106/58 (70) after 3 min  2nd standing  61/34 (41), HR 79      Home Living Family/patient expects to be discharged to:: Private residence Living Arrangements: Alone (pt states alone, chart says spouse)   Type of Home: House Home Access: Stairs to enter   [employer] of Steps: 1 Home Layout: One level Home Equipment: Cane - single point      Prior Function Level of Independence: Independent               Hand Dominance        Extremity/Trunk Assessment   Upper Extremity Assessment: Overall WFL for tasks assessed           Lower Extremity Assessment: Overall WFL for tasks assessed      Cervical / Trunk Assessment: Normal  Communication   Communication: No difficulties  Cognition Arousal/Alertness: Awake/alert Behavior During Therapy: WFL for tasks assessed/performed Overall Cognitive Status: Impaired/Different from baseline Area of Impairment: Memory;Safety/judgement;Awareness;Problem solving     Memory: Decreased short-term memory;Decreased recall of precautions   Safety/Judgement: Decreased awareness of safety;Decreased awareness of deficits   Problem Solving: Decreased initiation General Comments: Pt unable to recall precautions within session. With standing and hypotension pt with increased confusion and inappropriate comments    General Comments  Exercises General Exercises - Lower Extremity Long Arc Quad: AROM;Seated;Both;10 reps      Assessment/Plan    PT Assessment Patient needs continued PT services  PT Diagnosis Difficulty walking;Altered mental status   PT Problem List Decreased activity tolerance;Decreased balance;Decreased knowledge of precautions;Decreased safety awareness;Decreased knowledge of use of DME  PT Treatment Interventions Gait training;Functional mobility training;Stair training;Therapeutic activities;Therapeutic  exercise;Patient/family education;DME instruction;Balance training;Cognitive remediation   PT Goals (Current goals can be found in the Care Plan section) Acute Rehab PT Goals Patient Stated Goal: go home and watch racing PT Goal Formulation: With patient Time For Goal Achievement: 01/05/14 Potential to Achieve Goals: Good    Frequency Min 3X/week   Barriers to discharge Decreased caregiver support      Co-evaluation               End of Session Equipment Utilized During Treatment: Gait belt Activity Tolerance: Treatment limited secondary to medical complications (Comment) (limited by hypotension in standing) Patient left: in chair;with call bell/phone within reach;with nursing/sitter in room Nurse Communication: Mobility status;Precautions         Time: 5003-7048 PT Time Calculation (min): 23 min   Charges:   PT Evaluation $Initial PT Evaluation Tier I: 1 Procedure PT Treatments $Therapeutic Activity: 8-22 mins   PT G Codes:          Melford Aase 12/22/2013, 9:11 AM Elwyn Reach, [place]

## 2013-12-23 LAB — BASIC METABOLIC PANEL
ANION GAP: 12 (ref 5–15)
BUN: 27 mg/dL — ABNORMAL HIGH (ref 6–23)
CHLORIDE: 107 meq/L (ref 96–112)
CO2: 26 mEq/L (ref 19–32)
Calcium: 8.8 mg/dL (ref 8.4–10.5)
Creatinine, Ser: 1.17 mg/dL (ref 0.50–1.35)
GFR calc non Af Amer: 58 mL/min — ABNORMAL LOW (ref >90)
GFR, EST AFRICAN AMERICAN: 68 mL/min — AB (ref >90)
Glucose, Bld: 109 mg/dL — ABNORMAL HIGH (ref 70–99)
Potassium: 3.8 mEq/L (ref 3.7–5.3)
SODIUM: 145 meq/L (ref 137–147)

## 2013-12-23 LAB — CBC
HCT: 25.7 % — ABNORMAL LOW (ref 39.0–52.0)
Hemoglobin: 8.5 g/dL — ABNORMAL LOW (ref 13.0–17.0)
MCH: 29.3 pg (ref 26.0–34.0)
MCHC: 33.1 g/dL (ref 30.0–36.0)
MCV: 88.6 fL (ref 78.0–100.0)
PLATELETS: 196 10*3/uL (ref 150–400)
RBC: 2.9 MIL/uL — ABNORMAL LOW (ref 4.22–5.81)
RDW: 14.3 % (ref 11.5–15.5)
WBC: 7.1 10*3/uL (ref 4.0–10.5)

## 2013-12-23 LAB — GLUCOSE, CAPILLARY
GLUCOSE-CAPILLARY: 112 mg/dL — AB (ref 70–99)
GLUCOSE-CAPILLARY: 111 mg/dL — AB (ref 70–99)
Glucose-Capillary: 107 mg/dL — ABNORMAL HIGH (ref 70–99)
Glucose-Capillary: 96 mg/dL (ref 70–99)
Glucose-Capillary: 110 mg/dL — ABNORMAL HIGH (ref 70–99)

## 2013-12-23 MED ORDER — METOPROLOL TARTRATE 12.5 MG HALF TABLET
12.5000 mg | ORAL_TABLET | Freq: Two times a day (BID) | ORAL | Status: DC
  Filled 2013-12-23 (×2): qty 1

## 2013-12-23 MED ORDER — OXYCODONE HCL 5 MG PO TABS: 5.0000 mg | ORAL_TABLET | ORAL | Status: DC | PRN

## 2013-12-23 MED ORDER — POTASSIUM CHLORIDE CRYS ER 20 MEQ PO TBCR
20.0000 meq | EXTENDED_RELEASE_TABLET | Freq: Two times a day (BID) | ORAL | Status: DC
  Administered 2013-12-23 – 2013-12-24 (×3): 20 meq via ORAL
  Filled 2013-12-23 (×5): qty 1

## 2013-12-23 MED ORDER — BISACODYL 10 MG RE SUPP: 10.0000 mg | Freq: Every day | RECTAL | Status: DC | PRN

## 2013-12-23 MED ORDER — SODIUM CHLORIDE 0.9 % IV SOLN: 250.0000 mL | INTRAVENOUS | Status: DC | PRN

## 2013-12-23 MED ORDER — FUROSEMIDE 40 MG PO TABS
40.0000 mg | ORAL_TABLET | Freq: Every day | ORAL | Status: DC
  Administered 2013-12-23 – 2013-12-24 (×2): 40 mg via ORAL
  Filled 2013-12-23 (×2): qty 1

## 2013-12-23 MED ORDER — TRAMADOL HCL 50 MG PO TABS: 50.0000 mg | ORAL_TABLET | ORAL | Status: DC | PRN

## 2013-12-23 MED ORDER — ACETAMINOPHEN 325 MG PO TABS
650.0000 mg | ORAL_TABLET | Freq: Four times a day (QID) | ORAL | Status: DC | PRN
Start: 2013-12-23 — End: 2013-12-24

## 2013-12-23 MED ORDER — INSULIN ASPART 100 UNIT/ML ~~LOC~~ SOLN: 0.0000 [IU] | Freq: Three times a day (TID) | SUBCUTANEOUS | Status: DC

## 2013-12-23 MED ORDER — SODIUM CHLORIDE 0.9 % IJ SOLN
3.0000 mL | Freq: Two times a day (BID) | INTRAMUSCULAR | Status: DC
  Administered 2013-12-23 – 2013-12-24 (×3): 3 mL via INTRAVENOUS

## 2013-12-23 MED ORDER — MOVING RIGHT ALONG BOOK
Freq: Once | Status: AC
  Administered 2013-12-23: 09:00:00
  Filled 2013-12-23: qty 1

## 2013-12-23 MED ORDER — DOCUSATE SODIUM 100 MG PO CAPS
200.0000 mg | ORAL_CAPSULE | Freq: Every day | ORAL | Status: DC
  Administered 2013-12-23 – 2013-12-24 (×2): 200 mg via ORAL
  Filled 2013-12-23 (×2): qty 2

## 2013-12-23 MED ORDER — SODIUM CHLORIDE 0.9 % IJ SOLN: 3.0000 mL | INTRAMUSCULAR | Status: DC | PRN

## 2013-12-23 MED ORDER — ONDANSETRON HCL 4 MG/2ML IJ SOLN
4.0000 mg | Freq: Four times a day (QID) | INTRAMUSCULAR | Status: DC | PRN
Start: 2013-12-23 — End: 2013-12-24

## 2013-12-23 MED ORDER — ASPIRIN EC 325 MG PO TBEC
325.0000 mg | DELAYED_RELEASE_TABLET | Freq: Every day | ORAL | Status: DC
  Administered 2013-12-23 – 2013-12-24 (×2): 325 mg via ORAL
  Filled 2013-12-23 (×2): qty 1

## 2013-12-23 MED ORDER — ONDANSETRON HCL 4 MG PO TABS: 4.0000 mg | ORAL_TABLET | Freq: Four times a day (QID) | ORAL | Status: DC | PRN

## 2013-12-23 MED ORDER — BISACODYL 5 MG PO TBEC
10.0000 mg | DELAYED_RELEASE_TABLET | Freq: Every day | ORAL | Status: DC | PRN
  Administered 2013-12-23: 10 mg via ORAL
  Filled 2013-12-23: qty 2

## 2013-12-23 MED ORDER — FERROUS GLUCONATE 324 (38 FE) MG PO TABS
324.0000 mg | ORAL_TABLET | Freq: Two times a day (BID) | ORAL | Status: DC
  Administered 2013-12-23 – 2013-12-24 (×3): 324 mg via ORAL
  Filled 2013-12-23 (×5): qty 1

## 2013-12-23 NOTE — Progress Notes (Signed)
CARDIAC REHAB PHASE I   PRE:  Rate/Rhythm: 75 SR  BP:  Supine: 112/50  Sitting: 98/50  Standing: 95 RA   SaO2: 95 RA  MODE:  Ambulation: 360 ft   POST:  Rate/Rhythm: 84  BP:  Supine:   Sitting: 112/50  Standing:    SaO2: 96 RA 1535-1610 On arrival pt in bed without c/o. BP lying 112/50, sitting 98/50. Pt denies any dizziness sitting. He stood continued to deny any dizziness. Assisted X 1 and used walker to ambulate. Gait stea with walker. Pt is DOE and tires walking, audible wheezing noted.. Pt to recliner after walk with call light in reach and chair alarm on. Reported to RN Rodney Langton RN 12/23/2013 4:13 PM

## 2013-12-23 NOTE — Progress Notes (Signed)
Patient transferred with belongings, medications, and chart to room [place] room 12 with nurse. Attached to monitor and new nurse at bedside. No new problems at this time. Sandre Kitty

## 2013-12-23 NOTE — Evaluation (Addendum)
Occupational Therapy Evaluation Patient Details Name: [name] [name] MRN: [id number] DOB: 1934-02-26 Today's Date: 12/23/2013    History of Present Illness 80 year old male with history of dyslipidemia, obstructive sleep apnea and hypothyroidism with STEMI now s/p CABG. Pt with AMS post-op   Clinical Impression   Pt s/p above. Pt independent with ADLs, PTA. Feel pt will benefit from acute OT to increase independence prior to d/c. Recommending SNF for rehab. BP sitting 115/59 standing 63/44 and stood for a few minutes and was 120/104    Follow Up Recommendations  SNF;Supervision/Assistance - 24 hour    Equipment Recommendations  Other (comment) (defer to next venue)    Recommendations for Other Services       Precautions / Restrictions Precautions Precautions: Sternal;Fall Restrictions Weight Bearing Restrictions: No      Mobility Bed Mobility Overal bed mobility: Needs Assistance Bed Mobility: Sit to Sidelying;Rolling Rolling: Min guard       Sit to sidelying: Min guard General bed mobility comments: cues for precautions  Transfers Overall transfer level: Needs assistance Equipment used: Rolling walker (2 wheeled) Transfers: Sit to/from Stand Sit to Stand: Min assist         General transfer comment: assist to boost. Cues for hand placement.    Balance                                            ADL Overall ADL's : Needs assistance/impaired     Grooming: Brushing hair;Wash/dry face;Oral care;Min guard;Standing               Lower Body Dressing: Moderate assistance;Sit to/from stand   Toilet Transfer: Min guard;Ambulation;RW (chair)   Toileting- Clothing Manipulation and Hygiene: Minimal assistance;Sit to/from stand       Functional mobility during ADLs: Min guard;Rolling walker General ADL Comments: Educated on energy conservaiton techniques. Educated on precautions. Explained there is AE to assist with LB ADLs.       Vision                     Perception     Praxis      Pertinent Vitals/Pain Pain Assessment: No/denies pain     Hand Dominance     Extremity/Trunk Assessment Upper Extremity Assessment Upper Extremity Assessment: Overall WFL for tasks assessed   Lower Extremity Assessment Lower Extremity Assessment: Defer to PT evaluation       Communication Communication Communication: No difficulties   Cognition Arousal/Alertness: Awake/alert Behavior During Therapy: WFL for tasks assessed/performed Overall Cognitive Status: No family/caregiver present to determine baseline cognitive functioning Area of Impairment: Safety/judgement         Safety/Judgement: Decreased awareness of safety         General Comments       Exercises       Shoulder Instructions      Home Living Family/patient expects to be discharged to:: Private residence Living Arrangements: Alone (unsure)   Type of Home: House Home Access: Stairs to enter [employer] of Steps: 1   Home Layout: One level               Home Equipment: Cane - single point          Prior Functioning/Environment Level of Independence: Independent             OT Diagnosis: Generalized weakness;Acute pain   OT  Problem List: Decreased strength;Decreased activity tolerance;Decreased cognition;Decreased safety awareness;Decreased knowledge of use of DME or AE;Decreased knowledge of precautions   OT Treatment/Interventions: Self-care/ADL training;DME and/or AE instruction;Therapeutic activities;Patient/family education;Balance training;Cognitive remediation/compensation;Therapeutic exercise;Energy conservation    OT Goals(Current goals can be found in the care plan section) Acute Rehab OT Goals Patient Stated Goal: not stated OT Goal Formulation: With patient Time For Goal Achievement: 01/06/14 Potential to Achieve Goals: Good ADL Goals Pt Will Perform Grooming: with modified  independence;standing Pt Will Perform Lower Body Bathing: with set-up;with adaptive equipment;with supervision;sit to/from stand Pt Will Perform Lower Body Dressing: with set-up;with supervision;with adaptive equipment;sit to/from stand Pt Will Transfer to Toilet: with modified independence;ambulating Pt Will Perform Toileting - Clothing Manipulation and hygiene: with modified independence;sit to/from stand  OT Frequency: Min 2X/week   Barriers to D/C:            Co-evaluation              End of Session Equipment Utilized During Treatment: Gait belt;Rolling walker Nurse Communication: Mobility status;Other (comment) (BP)  Activity Tolerance: Patient tolerated treatment well Patient left: in bed;with call bell/phone within reach;with bed alarm set   Time: 1345-1409 OT Time Calculation (min): 24 min Charges:  OT General Charges $OT Visit: 1 Procedure OT Evaluation $Initial OT Evaluation Tier I: 1 Procedure OT Treatments $Self Care/Home Management : 8-22 mins G-CodesBenito Mccreedy OTR/L [phone] 12/23/2013, 2:27 PM

## 2013-12-23 NOTE — Clinical Social Work Psychosocial (Addendum)
Clinical Social Work Department BRIEF PSYCHOSOCIAL ASSESSMENT 12/23/2013  Patient:  [name] [name],[name] [name]     Account Number:  [id number]     Admit date:  12/17/2013  Clinical Social Worker:  Hubert Azure  Date/Time:  12/23/2013 03:02 PM  Referred by:  Physician  Date Referred:  12/23/2013 Referred for  SNF Placement   Other Referral:   Interview type:  Family Other interview type:   Marshell Levan Navedo (son) via telephone    PSYCHOSOCIAL DATA Living Status:  ALONE Admitted from facility:   Level of care:   Primary support name:  Kash Davie [phone]) Marshell Levan Cobos [phone]) Primary support relationship to patient:  CHILD, ADULT Degree of support available:   Both [name] and Marshell Levan make decision on patient's behalf.    CURRENT CONCERNS Current Concerns  Post-Acute Placement   Other Concerns:    SOCIAL WORK ASSESSMENT / PLAN CSW contacted patient's son [name], but was unable to speak with him to complete assessment. CSW contacted patient's son Marshell Levan and introduced self and role. CSW discussed SNF placement process with Marshell Levan and d/[name] plan. Per Marshell Levan, patient was residing at home and did not require the use of assistive devices to ambulate. Marshell Levan stated he and [name] are both agreeable to SNF placement, because they feel he needs it. Marshell Levan requested SNF placement search for [place] and [place].   Assessment/plan status:  Other - See comment Other assessment/ plan:   CSW to complete FL2 and submit PASARR for SNF placement.   Information/referral to community resources:    PATIENT'S/FAMILY'S RESPONSE TO PLAN OF CARE: Patient's son Marshell Levan was cooperative and engaging. Marshell Levan expressed genuine concern for improvement in patient's strength and mobility.   [place], [place] Weekend Clinical Social Worker [phone]

## 2013-12-23 NOTE — Progress Notes (Addendum)
      [address]       [place] [address]             [phone]      5 Days Post-Op Procedure(s) (LRB): Coronary artery bypass grafting times four using left internal mammary artery and endoscopically harvested right saphenous vein graft (N/A)  Subjective:  [name] [name] states he is doing okay this morning.  He was able to ambulate this morning, without dropping his pressure.  He was restarted on home regimen of Midodrine yesterday.  Patient unsure if he has moved his bowels.  Objective: Vital signs in last 24 hours: Temp:  [97.6 F (36.4 C)-98.4 F (36.9 C)] 98 F (36.7 C) (09/15 0830) Pulse Rate:  [66-126] 77 (09/15 0800) Cardiac Rhythm:  [-] Normal sinus rhythm (09/15 0800) Resp:  [15-26] 17 (09/15 0800) BP: (63-164)/(41-95) 124/66 mmHg (09/15 0800) SpO2:  [94 %-100 %] 98 % (09/15 0800) Weight:  [247 lb 2.2 oz (112.1 kg)] 247 lb 2.2 oz (112.1 kg) (09/15 0600)  Intake/Output from previous day: 09/14 0701 - 09/15 0700 In: 890 [P.O.:650; I.V.:90; IV Piggyback:150] Out: 1375 [RFFMB:8466] Intake/Output this shift: Total I/O In: -  Out: 300 [Urine:300]  General appearance: alert, cooperative and no distress Heart: regular rate and rhythm Lungs: clear to auscultation bilaterally Abdomen: soft, non-tender; bowel sounds normal; no masses,  no organomegaly Extremities: edema trace Wound: clean and dry  Lab Results:  Recent Labs  12/22/13 1020 12/23/13 0555  WBC 9.0 7.1  HGB 8.5* 8.5*  HCT 25.7* 25.7*  PLT 174 196   BMET:  Recent Labs  12/22/13 0436 12/23/13 0555  NA 143 145  K 3.4* 3.8  CL 106 107  CO2 27 26  GLUCOSE 98 109*  BUN 29* 27*  CREATININE 1.25 1.17  CALCIUM 8.4 8.8    PT/INR: No results found for this basename: LABPROT, INR,  in the last 72 hours ABG    Component Value Date/Time   PHART 7.388 12/19/2013 0931   HCO3 26.2* 12/19/2013 0931   TCO2 22 12/19/2013 1632   ACIDBASEDEF 7.0* 12/18/2013 2207   O2SAT 86.0 12/19/2013  0931   CBG (last 3)   Recent Labs  12/22/13 2312 12/23/13 0322 12/23/13 0828  GLUCAP 116* 107* 96    Assessment/Plan: S/P Procedure(s) (LRB): Coronary artery bypass grafting times four using left internal mammary artery and endoscopically harvested right saphenous vein graft (N/A)  1. CV- NSR, orthostatic hypotension improved- continue home Midodrine, continue to hold Beta Blocker 2. Pulm- no acute issues, off oxygen continue IS 3. Renal- creatinine stabilizing, lytes okay minimal hypervolemia- continue Lasix 4. Expected post operative blood loss anemia- Hgb at 8.5, continue Iron supplement 5. DM- CBGs controlled 6. Dispo- patient stable, orthostatic hypotension improved with use of Midodrine, will transfer to 2000 today   LOS: 6 days    [name] [name] 12/23/2013   Chart reviewed, patient examined, agree with above. He will need SNF at discharge. Social work consulted.

## 2013-12-23 NOTE — Clinical Social Work Placement (Addendum)
Clinical Social Work Department CLINICAL SOCIAL WORK PLACEMENT NOTE 12/23/2013  Patient:  [name] [name],[name] [name]  Account Number:  [id number] Admit date:  12/17/2013  Clinical Social Worker:  Carrington Clamp, LCSWA  Date/time:  12/23/2013 03:20 PM  Clinical Social Work is seeking post-discharge placement for this patient at the following level of care:   SKILLED NURSING   (*CSW will update this form in Epic as items are completed)   12/23/2013  Patient/family provided with [place] Department of Clinical Social Work's list of facilities offering this level of care within the geographic area requested by the patient (or if unable, by the patient's family).  12/23/2013  Patient/family informed of their freedom to choose among providers that offer the needed level of care, that participate in Medicare, Medicaid or managed care program needed by the patient, have an available bed and are willing to accept the patient.  12/23/2013  Patient/family informed of [facility]' ownership interest in [facility], as well as of the fact that they are under no obligation to receive care at this facility.  PASARR submitted to [employer] on 12/23/2013 PASARR number received on 12/23/2013  FL2 transmitted to all facilities in geographic area requested by pt/family on  12/23/2013 FL2 transmitted to all facilities within larger geographic area on   Patient informed that his/her managed care company has contracts with or will negotiate with  certain facilities, including the following:     Patient/family informed of bed offers received:  12/24/13  Patient chooses bed at  [facility] Physician recommends and patient chooses bed at    Patient to be transferred to [facility] on  12/24/13   Patient to be transferred to facility by  Patient son  Patient and family notified of transfer on 12/24/13 Name of family member notified:  Patient son at bedside Jeani Hawking)  The following physician request were  entered in Epic:   Additional Comments:  Carrington Clamp, [place] Weekend Clinical Social Worker [phone]

## 2013-12-24 ENCOUNTER — Inpatient Hospital Stay
Admission: RE | Admit: 2013-12-24 | Discharge: 2014-01-16 | Disposition: A | Discharge status: Home / Self Care | Payer: Medicare Other | Source: Ambulatory Visit | Attending: Internal Medicine | Admitting: Internal Medicine

## 2013-12-24 LAB — GLUCOSE, CAPILLARY
GLUCOSE-CAPILLARY: 99 mg/dL (ref 70–99)
GLUCOSE-CAPILLARY: 107 mg/dL — AB (ref 70–99)

## 2013-12-24 MED ORDER — FERROUS GLUCONATE 324 (38 FE) MG PO TABS: 324.0000 mg | ORAL_TABLET | Freq: Two times a day (BID) | ORAL | Status: DC

## 2013-12-24 MED ORDER — FUROSEMIDE 40 MG PO TABS: 40.0000 mg | ORAL_TABLET | Freq: Every day | ORAL | Status: DC

## 2013-12-24 MED ORDER — ASPIRIN 325 MG PO TBEC: 325.0000 mg | DELAYED_RELEASE_TABLET | Freq: Every day | ORAL | Status: DC

## 2013-12-24 MED ORDER — INSULIN ASPART 100 UNIT/ML ~~LOC~~ SOLN: 0.0000 [IU] | Freq: Three times a day (TID) | SUBCUTANEOUS | Status: DC

## 2013-12-24 MED ORDER — TRAMADOL HCL 50 MG PO TABS: 50.0000 mg | ORAL_TABLET | ORAL | Status: DC | PRN

## 2013-12-24 MED ORDER — LACTULOSE 10 GM/15ML PO SOLN
20.0000 g | Freq: Once | ORAL | Status: AC
  Administered 2013-12-24: 20 g via ORAL
  Filled 2013-12-24: qty 30

## 2013-12-24 MED ORDER — POTASSIUM CHLORIDE CRYS ER 20 MEQ PO TBCR: 20.0000 meq | EXTENDED_RELEASE_TABLET | Freq: Two times a day (BID) | ORAL | Status: DC

## 2013-12-24 NOTE — Progress Notes (Signed)
EPW d/c at this time per MD order; VSS; tips intact; pt on bedrest for 1 hour; call bell w/i reach; will cont. To monitor.

## 2013-12-24 NOTE — Progress Notes (Signed)
CARDIAC REHAB PHASE I   PRE:  Rate/Rhythm: 66 SR  BP:  Supine:   Sitting: 116/60      Standing:    SaO2: 95 RA  MODE:  Ambulation: 360 ft   POST:  Rate/Rhythm: 72  BP:  Supine:   Sitting: 100/60  Standing:    SaO2: 97 RA 1435-1515 Assisted X 1 and used walker to ambulate. Pt denies any dizziness with walking. He is DOE but RA sat after walk 97%, audible wheezing continues with walking. Pt to recliner after walk with call light in reach. Completed discharge education with pt and his lady friend. Pt voices understanding He agrees to [place]. CRP in [place], will send referral. Place recovery from heart surgery video on for them to watch.  Rodney Langton RN 12/24/2013 3:38 PM

## 2013-12-24 NOTE — Progress Notes (Signed)
IV and tele monitor d/c; Pt d/c to SNF; family to transport pt.

## 2013-12-24 NOTE — Discharge Instructions (Signed)
SNF: 1. Please obtain vital signs at least one time daily 2.Please weigh the patient daily. If he or she continues to gain weight or develops lower extremity edema, contact the office at [phone]. 3. Ambulate patient at least three times daily and please use sternal precautions.   Activity: 1.May walk up steps                2.No lifting more than ten pounds for four weeks.                 3.No driving for four weeks.                4.Stop any activity that causes chest pain, shortness of breath, dizziness, sweating or excessive weakness.                5.Avoid straining.                6.Continue with your breathing exercises daily.  Diet: Diabetic diet and Low fat, Low salt diet  Wound Care: May shower.  Clean wounds with mild soap and water daily. Contact the office at [phone] if any problems arise.  Diabetes Mellitus and Food It is important for you to manage your blood sugar (glucose) level. Your blood glucose level can be greatly affected by what you eat. Eating healthier foods in the appropriate amounts throughout the day at about the same time each day will help you control your blood glucose level. It can also help slow or prevent worsening of your diabetes mellitus. Healthy eating may even help you improve the level of your blood pressure and reach or maintain a healthy weight.  HOW CAN FOOD AFFECT ME? Carbohydrates Carbohydrates affect your blood glucose level more than any other type of food. Your dietitian will help you determine how many carbohydrates to eat at each meal and teach you how to count carbohydrates. Counting carbohydrates is important to keep your blood glucose at a healthy level, especially if you are using insulin or taking certain medicines for diabetes mellitus. Alcohol Alcohol can cause sudden decreases in blood glucose (hypoglycemia), especially if you use insulin or take certain medicines for diabetes mellitus. Hypoglycemia can be a life-threatening  condition. Symptoms of hypoglycemia (sleepiness, dizziness, and disorientation) are similar to symptoms of having too much alcohol.  If your health care provider has given you approval to drink alcohol, do so in moderation and use the following guidelines: Women should not have more than one drink per day, and men should not have more than two drinks per day. One drink is equal to: 12 oz of beer. 5 oz of wine. 1 oz of hard liquor. Do not drink on an empty stomach. Keep yourself hydrated. Have water, diet soda, or unsweetened iced tea. Regular soda, juice, and other mixers might contain a lot of carbohydrates and should be counted. WHAT FOODS ARE NOT RECOMMENDED? As you make food choices, it is important to remember that all foods are not the same. Some foods have fewer nutrients per serving than other foods, even though they might have the same number of calories or carbohydrates. It is difficult to get your body what it needs when you eat foods with fewer nutrients. Examples of foods that you should avoid that are high in calories and carbohydrates but low in nutrients include: Trans fats (most processed foods list trans fats on the Nutrition Facts label). Regular soda. Juice. Candy. Sweets, such as cake, pie, doughnuts, and cookies. Maceo Pro  foods. WHAT FOODS CAN I EAT? Have nutrient-rich foods, which will nourish your body and keep you healthy. The food you should eat also will depend on several factors, including: The calories you need. The medicines you take. Your weight. Your blood glucose level. Your blood pressure level. Your cholesterol level. You also should eat a variety of foods, including: Protein, such as meat, poultry, fish, tofu, nuts, and seeds (lean animal proteins are best). Fruits. Vegetables. Dairy products, such as milk, cheese, and yogurt (low fat is best). Breads, grains, pasta, cereal, rice, and beans. Fats such as olive oil, trans fat-free margarine, canola oil,  avocado, and olives. DOES EVERYONE WITH DIABETES MELLITUS HAVE THE SAME MEAL PLAN? Because every person with diabetes mellitus is different, there is not one meal plan that works for everyone. It is very important that you meet with a dietitian who will help you create a meal plan that is just right for you. Document Released: 12/22/2004 Document Revised: 04/01/2013 Document Reviewed: 02/21/2013 [facility] Patient Information 2015 [place], [place]. This information is not intended to replace advice given to you by your health care provider. Make sure you discuss any questions you have with your health care provider. Coronary Artery Bypass Grafting, Care After Refer to this sheet in the next few weeks. These instructions provide you with information on caring for yourself after your procedure. Your health care provider may also give you more specific instructions. Your treatment has been planned according to current medical practices, but problems sometimes occur. Call your health care provider if you have any problems or questions after your procedure. WHAT TO EXPECT AFTER THE PROCEDURE Recovery from surgery will be different for everyone. Some people feel well after 3 or 4 weeks, while for others it takes longer. After your procedure, it is typical to have the following:  Nausea and a lack of appetite.   Constipation.  Weakness and fatigue.   Depression or irritability.   Pain or discomfort at your incision site. HOME CARE INSTRUCTIONS  Take medicines only as directed by your health care provider. Do not stop taking medicines or start any new medicines without first checking with your health care provider.  Take your pulse as directed by your health care provider.  Perform deep breathing as directed by your health care provider. If you were given a device called an incentive spirometer, use it to practice deep breathing several times a day. Support your chest with a pillow or your arms when  you take deep breaths or cough.  Keep incision areas clean, dry, and protected. Remove or change any bandages (dressings) only as directed by your health care provider. You may have skin adhesive strips over the incision areas. Do not take the strips off. They will fall off on their own.  Check incision areas daily for any swelling, redness, or drainage.  If incisions were made in your legs, do the following:  Avoid crossing your legs.   Avoid sitting for long periods of time. Change positions every 30 minutes.   Elevate your legs when you are sitting.  Wear compression stockings as directed by your health care provider. These stockings help keep blood clots from forming in your legs.  Take showers once your health care provider approves. Until then, only take sponge baths. Pat incisions dry. Do not rub incisions with a washcloth or towel. Do not take baths, swim, or use a hot tub until your health care provider approves.  Eat foods that are high in fiber,  such as raw fruits and vegetables, whole grains, beans, and nuts. Meats should be lean cut. Avoid canned, processed, and fried foods.  Drink enough fluid to keep your urine clear or pale yellow.  Weigh yourself every day. This helps identify if you are retaining fluid that may make your heart and lungs work harder.  Rest and limit activity as directed by your health care provider. You may be instructed to:  Stop any activity at once if you have chest pain, shortness of breath, irregular heartbeats, or dizziness. Get help right away if you have any of these symptoms.  Move around frequently for short periods or take short walks as directed by your health care provider. Increase your activities gradually. You may need physical therapy or cardiac rehabilitation to help strengthen your muscles and build your endurance.  Avoid lifting, pushing, or pulling anything heavier than 10 lb (4.5 kg) for at least 6 weeks after surgery.  Do not  drive until your health care provider approves.  Ask your health care provider when you may return to work.  Ask your health care provider when you may resume sexual activity.  Keep all follow-up visits as directed by your health care provider. This is important. SEEK MEDICAL CARE IF:  You have swelling, redness, increasing pain, or drainage at the site of an incision.  You have a fever.  You have swelling in your ankles or legs.  You have pain in your legs.   You gain 2 or more pounds (0.9 kg) a day.  You are nauseous or vomit.  You have diarrhea. SEEK IMMEDIATE MEDICAL CARE IF:  You have chest pain that goes to your jaw or arms.  You have shortness of breath.   You have a fast or irregular heartbeat.   You notice a "clicking" in your breastbone (sternum) when you move.   You have numbness or weakness in your arms or legs.  You feel dizzy or light-headed.  MAKE SURE YOU:  Understand these instructions.  Will watch your condition.  Will get help right away if you are not doing well or get worse. Document Released: 10/14/2004 Document Revised: 08/11/2013 Document Reviewed: 09/03/2012 [facility] Patient Information 2015 [place], [place]. This information is not intended to replace advice given to you by your health care provider. Make sure you discuss any questions you have with your health care provider.

## 2013-12-24 NOTE — Progress Notes (Signed)
Report called to [facility].  °

## 2013-12-24 NOTE — Progress Notes (Signed)
Physical Therapy Treatment Patient Details Name: [name] [name] MRN: [id number] DOB: Aug 02, 1933 Today's Date: 12/24/2013    History of Present Illness      PT Comments    BP prior to Rx seated in recliner: 114/62 BP seated in recliner after amb:     90/49 BP seated after 5 min recovery:    127/57  Pt very impulsive with decreased safety awareness upon return to room due to fatigue.  SaO2 100% before and after Rx.  Follow Up Recommendations  SNF;Supervision/Assistance - 24 hour     Equipment Recommendations  Rolling walker with 5" wheels    Recommendations for Other Services       Precautions / Restrictions Precautions Precautions: Fall;Sternal Precaution Comments: watch BP    Mobility  Bed Mobility               General bed mobility comments: pt up in recliner upon arrival  Transfers   Equipment used: Rolling walker (2 wheeled)   Sit to Stand: Min assist         General transfer comment: assist to boost from chair without use of BUE. verbal cues for hand placment  Ambulation/Gait Ambulation/Gait assistance: Min guard Ambulation Distance (Feet): 200 Feet Assistive device: Rolling walker (2 wheeled) Gait Pattern/deviations: Trunk flexed;Decreased stride length;Step-through pattern Gait velocity: mildly decreased   General Gait Details: DOE with ambulation, audible wheezing upon return to Science writer    Modified Rankin (Stroke Patients Only)       Balance                                    Cognition Arousal/Alertness: Awake/alert Behavior During Therapy: WFL for tasks assessed/performed Overall Cognitive Status: No family/caregiver present to determine baseline cognitive functioning Area of Impairment: Safety/judgement;Problem solving     Memory: Decreased recall of precautions;Decreased short-term memory   Safety/Judgement: Decreased awareness of safety   Problem Solving:  Difficulty sequencing;Requires verbal cues;Slow processing      Exercises      General Comments        Pertinent Vitals/Pain Pain Assessment: No/denies pain    Home Living                      Prior Function            PT Goals (current goals can now be found in the care plan section) Progress towards PT goals: Progressing toward goals    Frequency  Min 3X/week    PT Plan Current plan remains appropriate    Co-evaluation             End of Session Equipment Utilized During Treatment: Gait belt Activity Tolerance: Treatment limited secondary to medical complications (Comment) Patient left: in chair;with call bell/phone within reach;with chair alarm set     Time: 4034-7425 PT Time Calculation (min): 24 min  Charges:  $Gait Training: 23-37 mins                    G Codes:      Lorriane Shire 12/24/2013, 9:56 AM

## 2013-12-24 NOTE — Clinical Social Work Note (Signed)
Clinical Social Worker facilitated patient discharge including contacting patient family and facility to confirm patient discharge plans.  Clinical information faxed to facility and family agreeable with plan.  CSW arranged transport via patient son to [facility].  RN to call report prior to discharge.  Clinical Social Worker will sign off for now as social work intervention is no longer needed. Please consult [place] again if new need arises.  Barbette Or, [place]

## 2013-12-24 NOTE — Discharge Summary (Signed)
Physician Discharge Summary       [address]       [place] [address]             [phone]    Patient ID: [name] [name] MRN: [id number] DOB/AGE: 78-Mar-1935 78 y.o.  Admit date: 12/17/2013 Discharge date: 12/24/2013  Admission Diagnoses: 1. S/p STEMI 2. CAD 3. History of hyperlipidemia 4. History of orthostatic hypotension 5. History of tobacco abuse 6. History of OSA (on CPAP) 7. History of hyperthyroidism 8. History of bladder tumor  Discharge Diagnoses:  1. S/p STEMI 2. CAD 3. History of hyperlipidemia 4. History of orthostatic hypotension 5. History of tobacco abuse 6. History of OSA (on CPAP) 7. History of hyperthyroidism 8. History of bladder tumor 9. Newly diagnosed DM (HGA1C6.7) 10. ABL anemia  Procedure (s):  1. Cardiac catheterization done by Dr. Ellyn Hack on 12/17/2013: FINDINGS:  Hemodynamics:  Central Aortic Pressure / Mean: 142/74/102 mmHg  Left Ventricular Pressure / LVEDP: 142/17/29 mmHg Left Ventriculography:  EF: Roughly 45-50 %  Wall Motion: Mild to moderate basal to mid inferior hypokinesis. Coronary Anatomy:  Dominance: Right Left Main: Normal caliber vessel with mild distal 20% stenosis. It bifurcates distally into the LAD and Circumflex. Mild calcification and mild distal disease. LAD: Normal caliber vessel with diffuse moderate to severe stenosis in the proximal segment ranging from 40-80% prior to branch of V1 and ST 1. There are focal areas of 60 and 80% but usually disease. After the branch point there is another focal 60%. Beyond that there is maybe he 30-40% range point lesion in the distal mid vessel the vessel was essentially normal beyond this as it reaches the apex.  D1: Moderate caliber, major branch with a 70-90% stenosis proximally. The vessel then actually normalizes into a moderate caliber vessel downstream. Left Circumflex: Large caliber, nondominant vessel. It gives off a proximal OM1 and then a: 2 from the mid  vessel before going to the group continues on the small posterolateral branch. The main circumflex does not have significant disease.  OM1: Small moderate caliber vessel with diffuse proximal 70% stenosis.  OM 2: Large caliber major branch with a proximal 99% subtotal occlusion by a focal 70-80% stenosis. The remainder the vessel is large in diameter with minimal disease as it bifurcates distally. RCA: Begins as a large caliber vessel that quickly tapers and to a 60% stenosis. Dura-Vent hand and 99+ percent subtotal occlusions of her thrombotic in nature. The entire mid vessel is diffusely diseased with anywhere from 40-60% narrowing from the original vessel size. It then normalizes after the crux only to have a roughly 50% stenosis just prior to bifurcation into the Right Posterior Descending Artery (RPDA) and the Right Posterior AV Groove Branch (RPAV)  RPDA: Moderate caliber vessel that reaches almost to the apex. Minimal luminal irregularities.  RPL System:The RPAV begins as a moderate caliber vessel gives rise to one small one moderate caliber posterolateral branch. Angiographically normal   2.Median Sternotomy Extracorporeal circulation 3. Coronary artery bypass grafting x 4  Left internal mammary graft to the LAD  SVG to diagonal  SVG to OM  SVG to PDA 4. Endoscopic vein harvest from the right leg   History of Presenting Illness: The patient is a 80 year old gentleman with a history of remote smoking, hyperlipidemia, OSA on CPAP, and mild dementia who lives at home alone and cares for himself. He has noted feeling fatigued lately and not very active and had an episode of  chest discomfort 2 nights ago that he thought was heartburn. Then last night he was awoken with 10/10 chest pain radiating to the right arm with nausea and diaphoresis. EMS was called and ECG showed inferior ST elevation with a troponin poc of 1.06. He was taken to the cath lab and the culprit appeared to be a severely  diseased RCA with thrombotic appearing subtotal occlusions. There is also 95-99% stenosis in the large OM2 with diffuse disease in the proximal LAD and D1. The EF was mildly reduced with inferior hypokinesis and severely elevated LVEDP. Dr. Cyndia Bent discussed the need for coronary artery bypass grafting surgery. Potential risks, benefits, and complications were discussed and he agreed to proceed with surgery. Pre operative carotid duplex US showed no significant carotid artery stenosis. He underwent a CABG x 4 on 12/18/2013.  Brief Hospital Course:   The patient tolerated the procedure without difficulty.  He was transferred to the SICU in stable condition.  During his stay in the SICU the patient was extubated on POD #1.  He was weaned off Dopamine and Neo Synephrine as tolerated.  His chest tubes and arterial lines were removed without difficulty.  He developed some mild confusion in the SICU which resolved prior to being transferred to the floor.  The patient was ambulating with assistance, however he would become orthostatic upon standing.  He was restarted on his home Midodrine.  Due to this he was not started on a Beta Blocker or ACE Inhibitor.  He was maintaining NSR and felt medically stable for transfer to the telemetry unit.  The patient continues to progress.  His orthostatic hypotension has improved with use of Midodrine.  However, his blood pressure continues to remain on the low side and he will not be discharged home on a beta blocker.  He continues to maintain NSR and his pacing wires were removed without difficulty.  He is ambulating with assistance.  He is tolerating a regular diet.  He was evaluated by PT who recommended SNF placement.  He is felt medically stable for discharge once SNF bed is available. Please have the SNF do the following: 1. Obtain vital signs at least one time daily 2. Weigh the patient daily. If he continues to gain weight or develops lower extremity edema, contact the  office at [phone]. 3. Ambulate patient at least three times daily and please use sternal precautions.  Latest Vital Signs: Blood pressure 99/57, pulse 72, temperature 98.5 F (36.9 C), temperature source Oral, resp. rate 18, height 5' 10.5" (1.791 m), weight 246 lb 7.6 oz (111.8 kg), SpO2 97.00%.  Physical Exam: Cardiovascular: RRR, no murmurs, gallops, or rubs.  Pulmonary: Diminished at bases; no rales, wheezes, or rhonchi.  Abdomen: Soft, non tender, bowel sounds present.  Extremities: Mild bilateral lower extremity edema.  Wounds: Clean and dry. No erythema or signs of infection.  Discharge Condition:Stable  Recent laboratory studies:  Lab Results  Component Value Date   WBC 7.1 12/23/2013   HGB 8.5* 12/23/2013   HCT 25.7* 12/23/2013   MCV 88.6 12/23/2013   PLT 196 12/23/2013   Lab Results  Component Value Date   NA 145 12/23/2013   K 3.8 12/23/2013   CL 107 12/23/2013   CO2 26 12/23/2013   CREATININE 1.17 12/23/2013   GLUCOSE 109* 12/23/2013    Diagnostic Studies: Dg Chest Port 1 View  12/22/2013   CLINICAL DATA:  Status post CABG  EXAM: PORTABLE CHEST - 1 VIEW  COMPARISON:  Portable chest  x-ray of December 21, 2013  FINDINGS: The lungs are adequately inflated allowing for patient positioning. A small amount of pleural fluid and/or basilar atelectasis on the left is suspected but this has improved. The cardio pericardial silhouette remains mildly enlarged. The pulmonary vascularity is not engorged. The right internal jugular venous catheter tip lies in the region of the proximal SVC. There are 6 intact sternal wires present. The observed bony thorax elsewhere is unremarkable.  IMPRESSION: Mild interval improvement in the appearance of the pulmonary interstitium. A tiny left pleural effusion persists. There is no evidence of post procedure complication.   Electronically Signed   By: David  Martinique   On: 12/22/2013 08:00   Discharge Medications:   Medication List          aspirin 325 MG EC tablet  Take 1 tablet (325 mg total) by mouth daily.     donepezil 10 MG tablet  Commonly known as:  ARICEPT  Take 10 mg by mouth at bedtime.     escitalopram 20 MG tablet  Commonly known as:  LEXAPRO  Take 20 mg by mouth daily.     ferrous gluconate 324 MG tablet  Commonly known as:  FERGON  Take 1 tablet (324 mg total) by mouth 2 (two) times daily with a meal. For one month then stop.     furosemide 40 MG tablet  Commonly known as:  LASIX  Take 1 tablet (40 mg total) by mouth daily. For 4 days then stop.     insulin aspart 100 UNIT/ML injection  Commonly known as:  novoLOG  - Inject 0-24 Units into the skin 4 (four) times daily -  before meals and at bedtime. CBG  < 120 (dose in units): 0 units      - CBG 120 - 160 (dose in units): 2        CBG 251 - 300 (dose in units): 12        - CBG 161-200 (dose in units): 4       CBG 301 - 350 (dose in units) 16   - CBG 201 - 250 (dose in units): 8       CBG 351 - 450 (dose in units): 20     - CBG > 450 24 units and obtain STAT glucose and notify MD     levothyroxine 112 MCG tablet  Commonly known as:  SYNTHROID, LEVOTHROID  Take 112 mcg by mouth daily before breakfast.     midodrine 5 MG tablet  Commonly known as:  PROAMATINE  Take 1 tablet (5 mg total) by mouth 2 (two) times daily with a meal.     pantoprazole 40 MG tablet  Commonly known as:  PROTONIX  Take 40 mg by mouth every morning. ACID REFLUX/ GERD     potassium chloride SA 20 MEQ tablet  Commonly known as:  K-DUR,KLOR-CON  Take 1 tablet (20 mEq total) by mouth 2 (two) times daily. For 4 days then stop.     traMADol 50 MG tablet  Commonly known as:  ULTRAM  Take 1 tablet (50 mg total) by mouth every 4 (four) hours as needed for moderate pain.       The patient has been discharged on:   1.Beta Blocker:  Yes [   ]                              No   [  x ]                              If No, reason: Labile blood pressure  2.Ace Inhibitor/ARB:  Yes [   ]                                     No  [ x   ]                                     If No, reason: labile blood pressure  3.Statin:   Yes [   ]                  No  [ x  ]                  If No, reason: Allergic  4.[place]:  Yes  [  x ]                  No   [   ]                  If No, reason:  Follow Up Appointments: Follow-up Information   Follow up with [facility], NP On 01/13/2014. (Appointment time is at 2:30 pm)    Specialty:  Cardiology   Contact information:   [address] [place] [address] [phone]       Follow up with Gaye Pollack, MD On 01/28/2014. (PA/LAT CXR to be taken (at [facility] which is in the same building as Dr. Laurene Footman office) on 01/28/2014 at 12:30 pm;Appointment time with Dr. Cyndia Bent is at 1:30 pm)    Specialty:  Cardiothoracic Surgery   Contact information:   [address] [place] [address] [phone]       Follow up with Purvis Kilts, MD. (Call for a follow up appointment regarding pre op HGA1C 6.7)    Specialty:  Family Medicine   Contact information:   [address] [place] [address] [phone]       Follow up with SNF. (Please remove chest tube sutures on 12/28/2013)       Signed: Marivel Mcclarty MPA-C 12/24/2013, 2:27 PM

## 2013-12-24 NOTE — Progress Notes (Addendum)
      [address]       [place] [address]             [phone]        6 Days Post-Op Procedure(s) (LRB): Coronary artery bypass grafting times four using left internal mammary artery and endoscopically harvested right saphenous vein graft (N/A)  Subjective: Patient has not had bowel movement in a couple of days.  Objective: Vital signs in last 24 hours: Temp:  [98 F (36.7 C)-98.9 F (37.2 C)] 98.7 F (37.1 C) (09/16 0356) Pulse Rate:  [64-85] 76 (09/16 0356) Cardiac Rhythm:  [-] Normal sinus rhythm (09/16 0755) Resp:  [19-20] 20 (09/16 0356) BP: (119-138)/(63-79) 136/70 mmHg (09/16 0356) SpO2:  [96 %-99 %] 99 % (09/16 0356) Weight:  [246 lb 7.6 oz (111.8 kg)] 246 lb 7.6 oz (111.8 kg) (09/16 0356)  Pre op weight 110 kg Current Weight  12/24/13 246 lb 7.6 oz (111.8 kg)      Intake/Output from previous day: 09/15 0701 - 09/16 0700 In: 120 [P.O.:120] Out: 1625 [Urine:1625]   Physical Exam:  Cardiovascular: RRR, no murmurs, gallops, or rubs. Pulmonary: Diminished at bases; no rales, wheezes, or rhonchi. Abdomen: Soft, non tender, bowel sounds present. Extremities: Mild bilateral lower extremity edema. Wounds: Clean and dry.  No erythema or signs of infection.  Lab Results: CBC: Recent Labs  12/22/13 1020 12/23/13 0555  WBC 9.0 7.1  HGB 8.5* 8.5*  HCT 25.7* 25.7*  PLT 174 196   BMET:  Recent Labs  12/22/13 0436 12/23/13 0555  NA 143 145  K 3.4* 3.8  CL 106 107  CO2 27 26  GLUCOSE 98 109*  BUN 29* 27*  CREATININE 1.25 1.17  CALCIUM 8.4 8.8    PT/INR:  Lab Results  Component Value Date   INR 1.41 12/18/2013   INR 1.11 12/17/2013   ABG:  INR: Will add last result for INR, ABG once components are confirmed Will add last 4 CBG results once components are confirmed  Assessment/Plan:  1. CV - S/p STEMI. SR. Previous orthostatic hypotension. On  Midodrine 5 tid. Consider starting low dose ACE as BP improved. 2.  Pulmonary -  Encourage incentive spirometer 3. Volume Overload - On Lasix 40 daily 4.  Acute blood loss anemia - H and H stable at 8.5 and 25.7. Continue Fergon. 5. New DM-CBGs 112/111/99. Pre op HGA1C 6.7. Will need follow up with medical doctor after discharge. 6. Remove EPW 7. LOC constipation 8. Will need SNF when ready for discharge  ZIMMERMAN,DONIELLE MPA-C 12/24/2013,8:34 AM   Chart reviewed, patient examined, agree with above. His BP is better on Midodrine. Will decrease to bid which was his home dose. I would not add ACE I at this time. He will be better off with a little higher BP than orthostatic hypotension. I stopped his beta blocker yesterday due to hypotension.

## 2013-12-25 ENCOUNTER — Telehealth: Payer: Self-pay | Admitting: Neurology

## 2013-12-25 ENCOUNTER — Other Ambulatory Visit: Payer: Self-pay | Admitting: *Deleted

## 2013-12-25 LAB — GLUCOSE, CAPILLARY
GLUCOSE-CAPILLARY: 142 mg/dL — AB (ref 70–99)
GLUCOSE-CAPILLARY: 110 mg/dL — AB (ref 70–99)
GLUCOSE-CAPILLARY: 114 mg/dL — AB (ref 70–99)
Glucose-Capillary: 140 mg/dL — ABNORMAL HIGH (ref 70–99)
Glucose-Capillary: 115 mg/dL — ABNORMAL HIGH (ref 70–99)
Glucose-Capillary: 142 mg/dL — ABNORMAL HIGH (ref 70–99)

## 2013-12-25 MED ORDER — TRAMADOL HCL 50 MG PO TABS: 50.0000 mg | ORAL_TABLET | ORAL | Status: DC | PRN

## 2013-12-25 NOTE — Telephone Encounter (Signed)
[facility] 

## 2013-12-25 NOTE — Telephone Encounter (Signed)
Wife called to cancel the patient's sleep study appointment because he is currently hospitalized for having a heart attack.

## 2013-12-26 ENCOUNTER — Non-Acute Institutional Stay (SKILLED_NURSING_FACILITY): Payer: Medicare Other | Admitting: Internal Medicine

## 2013-12-26 DIAGNOSIS — F0390 Unspecified dementia without behavioral disturbance: Secondary | ICD-10-CM

## 2013-12-26 DIAGNOSIS — R609 Edema, unspecified: Secondary | ICD-10-CM

## 2013-12-26 DIAGNOSIS — I2510 Atherosclerotic heart disease of native coronary artery without angina pectoris: Secondary | ICD-10-CM

## 2013-12-26 DIAGNOSIS — D508 Other iron deficiency anemias: Secondary | ICD-10-CM

## 2013-12-26 DIAGNOSIS — Z951 Presence of aortocoronary bypass graft: Secondary | ICD-10-CM

## 2013-12-26 LAB — GLUCOSE, CAPILLARY
GLUCOSE-CAPILLARY: 120 mg/dL — AB (ref 70–99)
GLUCOSE-CAPILLARY: 113 mg/dL — AB (ref 70–99)
Glucose-Capillary: 99 mg/dL (ref 70–99)

## 2013-12-26 NOTE — Telephone Encounter (Signed)
I am sorry to hear that. Hope for a full recovery. CD

## 2013-12-26 NOTE — Progress Notes (Signed)
Patient ID: [name] [name], male   DOB: [birth year], 80 y.o.   MRN: [id number]   This is an acute visit.  Total care skilled.  Facility [facility].  Chief complaint-acute visit status post hospitalization for CABG x4-with history of MI.  History of present illness.  Patient is a 80 year old male with a history of hyperlipidemia obstructive sleep apnea and mild dementia a number of old history of smoking-patient presented to the ER with acute chest pain-EKG showed inferior ST elevation with a troponin of 1.06-he was found to have a severely diseased RCA with thrombolytic appearing subtotal occlusions-there was also 95-99% stenosis in the large OM 2 and diffuse disease in the proximal LAD M.D. 1.  Ejection fraction was mildly reduced.  Patient underwent a CABG x4.  He tolerated the procedure well.  Postop when ambulating he did develop some orthostatic hypotension he was restarted onMidodrine---this improved however blood pressure continued to remain on the low side and he was not discharged on beta blocker.  Currently he has no complaints other than really wanting to get started on rehabilitation.  His vital signs are stable.  Previous medical history.  History coronary artery disease with CABG x4-status post STEMI.  Hyperlipidemia.  Orthostatic hypotension.  Previous history of tobacco abuse.  History of obstructive sleep apnea.  History of hyperparathyroidism.  History of bladder tumor.  Surgical history.  12/18/2013-CABG x4.  Previous history of hemorrhoid surgery as well as wrist fracture surgery  Social History  History    Social History   .  Marital Status:  Divorced     Spouse Name:  N/A     Number of Children:  3   .  Years of Education:  HS    Occupational History   .      Social History Main Topics   .  Smoking status:  Former Smoker -- 1.00 packs/day for 4 years     Types:  Cigarettes   .  Smokeless tobacco:  Former Systems developer     Types:  [place]  date:  01/03/1960   .  Alcohol Use:  No   .  Drug Use:  No   .  Sexual Activity:  Yes     Birth Control/ Protection:  None    Other Topics  Concern   .  Not on file    Social History Narrative    Patient is single and lives alone.    Patient is retired.    Patient has a high school education.    Patient is right-handed.    Patient drinks one cup of coffee and one cup of soda or tea daily.    Patient has three adult children.   Medications.  Aspirin 325 mg daily.  Aricept 10 mg each bedtime.  Lexapro 20 mg daily.  Ferrous gluconate 324 mg twice a day.  Lasix 40 mg daily x4 days then DC.  Sliding scale NovoLog insulin.  Synthroid 112 mcg every morning.  Midodrine 5 mg twice a day with meals.  Protonix daily 40 mg.  Potassium 20 mEq twice a day for 4 days.  Tramadol 50 mg every 4 hours when necessary pain   review of systems.  In general no complaint of fever chills.  Skin does not complaining of any rash or itching.  Head ears eyes nose mouth and throat does not complaining of any shortness old or visual changes.  Respiratory denies any cough or shortness of breath.  Cardiac-history as  noted above but does not complaining of chest pain does have some chronic lower extremity edema.  GI-does not complaining of nausea vomiting diarrhea constipation or abdominal discomfort.  Muscle skeletal does complain of weakness but does not complaining of joint pain.  Neurologic does not complaining of dizziness or headache does have a history of orthostatic hypotension-.  Psych does have a diagnosis of mild dementia.  Physical exam.  Temperature is 98.3 pulse 90 respirations 20 blood pressure 120/66 weight is 247.6 O2 saturation is 92%.  In general this is a pleasant elderly male in no distress sitting comfortably in his wheelchair.  His skin is warm and dry.  He does have what appeared to be a  well healing mid chest scar there is no drainage or bleeding or  surrounding erythema- Eyes-pupils appear reactive to light sclera and conjunctiva are clear visual acuity appears grossly intact.  Oropharynx is clear mucous membranes moist.  Chest is largely clear to auscultation there is no labored breathing there is a minimal slight expiratory wheeze at the left base  Heart is regular rate and rhythm without murmur gallop or rub he appears to have trace lower extremity edema.  His abdomen is obese soft nontender positive bowel sounds.  Muscle skeletal does move all extremities x4 he is in a wheelchair I did not note any deformities.  Neurologic grossly intact no lateralizing findings her speech is clear.  Psych-he appears grossly alert and oriented does have a diagnosis of mild dementia but was quite appropriate..  Labs.  12/26/2013.  WBC 9.6-hemoglobin 8.5 platelets 325.  141 potassium 4.3 BUN 21 creatinine 1.2.  Assessment plan.  #1-history of coronary artery disease status post CABG with a STEMI--this is followed by cardiology appears to be stable in this regards there is an order to monitor weights daily-he is on aspirin-has not been started on beta blocker secondary to low blood pressures in the hospital at this point will monitor.  #2-history of anemia -- appears his hemoglobin had dropped to 7.8 in the hospital I suspect this was postop-appears to be slowly trending up-he is on iron will recheck this on Monday, September 22.  #3-history of question hyperthyroidism-hypothyroidism-he is on Synthroid which would make one suspect this is hypothyroidism-we'll need to update TSH as well-.  #4-diabetes type 2 apparently a new diagnosis secondary to a hemoglobin A1c of 6.7 in the hospital --it appears blood sugars have been stable in the low 100s---he is on a sliding scale if needed.  #5-history of edema-he is on a short course of Lasix and potassium again his weights are being monitored this appears to be relatively stable we'll update lab work  BMP on Monday, September 21.  #6-history of orthostatic hypotension he is back on Midodrine--at this point will monitor clinically he appears stable.  #7-history of dementia this appears to be mild he is on Aricept.  #8 history depression he continues on Lexapro this appears to be stable as well - will monitor  [id number]-of note greater than 30 minutes spent assessing patient-reviewing his chart-and coordinating and formulating a plan of care for numerous diagnoses-of note greater than 50% of time spent coordinating plan of care.

## 2013-12-27 LAB — GLUCOSE, CAPILLARY
Glucose-Capillary: 126 mg/dL — ABNORMAL HIGH (ref 70–99)
Glucose-Capillary: 109 mg/dL — ABNORMAL HIGH (ref 70–99)
Glucose-Capillary: 120 mg/dL — ABNORMAL HIGH (ref 70–99)
Glucose-Capillary: 141 mg/dL — ABNORMAL HIGH (ref 70–99)

## 2013-12-28 ENCOUNTER — Encounter: Payer: Self-pay | Admitting: Internal Medicine

## 2013-12-28 LAB — GLUCOSE, CAPILLARY
GLUCOSE-CAPILLARY: 108 mg/dL — AB (ref 70–99)
GLUCOSE-CAPILLARY: 115 mg/dL — AB (ref 70–99)
Glucose-Capillary: 106 mg/dL — ABNORMAL HIGH (ref 70–99)
Glucose-Capillary: 125 mg/dL — ABNORMAL HIGH (ref 70–99)

## 2013-12-29 ENCOUNTER — Non-Acute Institutional Stay (SKILLED_NURSING_FACILITY): Payer: Medicare Other | Admitting: Internal Medicine

## 2013-12-29 DIAGNOSIS — I951 Orthostatic hypotension: Secondary | ICD-10-CM

## 2013-12-29 DIAGNOSIS — I2510 Atherosclerotic heart disease of native coronary artery without angina pectoris: Secondary | ICD-10-CM

## 2013-12-29 DIAGNOSIS — D508 Other iron deficiency anemias: Secondary | ICD-10-CM

## 2013-12-29 DIAGNOSIS — F0390 Unspecified dementia without behavioral disturbance: Secondary | ICD-10-CM

## 2013-12-29 LAB — GLUCOSE, CAPILLARY
GLUCOSE-CAPILLARY: 109 mg/dL — AB (ref 70–99)
GLUCOSE-CAPILLARY: 108 mg/dL — AB (ref 70–99)
Glucose-Capillary: 120 mg/dL — ABNORMAL HIGH (ref 70–99)
Glucose-Capillary: 105 mg/dL — ABNORMAL HIGH (ref 70–99)

## 2013-12-29 NOTE — Progress Notes (Signed)
Patient ID: [name] [name], male   DOB: Mar 06, 1934, 80 y.o.   MRN: [id number]  Facility; [facility] Chief complaint; admission to SNF post admit to [facility] from 9/9 to 9/16  History; this is a patient with no known history of coronary artery disease we'll woke with the chest pain radiating to his right arm nausea and diaphoresis. EKG showed inferior ST elevation MI with a peak troponin of 1.06. He underwent cardiac catheterization which showed mildly reduced inferior hypokinesis and severely elevated left ventricular end-diastolic pressure. He was seen by cardiac surgery and underwent a CABG x4. He tolerated surgery well it was noted that he had postoperative orthostasis therefore was not started on a beta blocker or an ACE inhibitor. He was put on Midodrin. He has been transferred here for rehabilitation  Patient is living on his own and is listed as having mild dementia. Nursing staff reports he is sleeping during the day and up all night this seems confused. He is a recently diagnosed diabetic with a hemoglobin A1c of 6.7  Past Medical History  Diagnosis Date  . Epigastric pain   . Chronic constipation   . GERD (gastroesophageal reflux disease)   . Bladder tumor 09  . Hoarseness   . Hyperthyroidism   . Alzheimer disease   . Shortness of breath     Non-ischemic Myoview 08/2009  . Cancer     bladder tumor  . Arthritis   . Hyperlipidemia   . OSA on CPAP   . History of nuclear stress test 08/2009    negative for ischemia   . Dizziness   . Headache(784.0)   . Hypoxemia 11/19/2013  . Orthostatic hypotension 11/19/2013   Past Surgical History  Procedure Laterality Date  . Upper gastrointestinal endoscopy  04/06/2010    EGD ED  . Hemorrhoid surgery    . Hand surgery      right  . Wrist fracture surgery      pins placed-left  . Cystoscopy with biopsy  01/09/2012    Procedure: CYSTOSCOPY WITH BIOPSY;  Surgeon: Marissa Nestle, MD;  Location: [facility];  Service: Urology;  Laterality: N/A;   Cystoscopy with Multiple Bladder Biopsies  . Esophagogastroduodenoscopy (egd) with esophageal dilation N/A 01/08/2013    Procedure: ESOPHAGOGASTRODUODENOSCOPY (EGD) WITH ESOPHAGEAL DILATION;  Surgeon: Rogene Houston, MD;  Location: [facility] SUITE;  Service: Endoscopy;  Laterality: N/A;  120  . Coronary artery bypass graft N/A 12/18/2013    Procedure: Coronary artery bypass grafting times four using left internal mammary artery and endoscopically harvested right saphenous vein graft;  Surgeon: Gaye Pollack, MD;  Location: [facility] OR;  Service: Open Heart Surgery;  Laterality: N/A;    Current Outpatient Prescriptions on File Prior to Visit  Medication Sig Dispense Refill  . aspirin EC 325 MG EC tablet Take 1 tablet (325 mg total) by mouth daily.  30 tablet  0  . donepezil (ARICEPT) 10 MG tablet Take 10 mg by mouth at bedtime.      [name] escitalopram (LEXAPRO) 20 MG tablet Take 20 mg by mouth daily.       . ferrous gluconate (FERGON) 324 MG tablet Take 1 tablet (324 mg total) by mouth 2 (two) times daily with a meal. For one month then stop.    3  . furosemide (LASIX) 40 MG tablet Take 1 tablet (40 mg total) by mouth daily. For 4 days then stop.  30 tablet    . insulin aspart (NOVOLOG) 100 UNIT/ML injection Inject  0-24 Units into the skin 4 (four) times daily -  before meals and at bedtime. CBG  < 120 (dose in units): 0 units     CBG 120 - 160 (dose in units): 2        CBG 251 - 300 (dose in units): 12       CBG 161-200 (dose in units): 4       CBG 301 - 350 (dose in units) 16  CBG 201 - 250 (dose in units): 8       CBG 351 - 450 (dose in units): 20    CBG > 450 24 units and obtain STAT glucose and notify MD  10 mL  11  . levothyroxine (SYNTHROID, LEVOTHROID) 112 MCG tablet Take 112 mcg by mouth daily before breakfast.      . midodrine (PROAMATINE) 5 MG tablet Take 1 tablet (5 mg total) by mouth 2 (two) times daily with a meal.  60 tablet  3  . pantoprazole (PROTONIX) 40 MG tablet Take 40 mg by mouth every  morning. ACID REFLUX/ GERD      . potassium chloride SA (K-DUR,KLOR-CON) 20 MEQ tablet Take 1 tablet (20 mEq total) by mouth 2 (two) times daily. For 4 days then stop.  4 tablet    . traMADol (ULTRAM) 50 MG tablet Take 1 tablet (50 mg total) by mouth every 4 (four) hours as needed for moderate pain.  180 tablet  0   Social; the patient lives on his own and [place]. Claims to be independent with ADLs and IADL. He has family in the area  reports that he has quit smoking. His smoking use included Cigarettes. He has a 4 pack-year smoking history. He quit smokeless tobacco use about 54 years ago. His smokeless tobacco use included Chew. He reports that he does not drink alcohol or use illicit drugs.  Review of system Gen. patient states he feels weak but otherwise well Respiratory no shortness of breath Cardiac no exertional chest discomfort or palpitations GI no abdominal pain GU no voiding difficulties  Physical examination Gen. patient appears well and in no distress Vitals; O2 sat 97% on room air respirations 16 and unlabored pulse rate 73 and regular Respiratory; clear entry bilaterally no wheezing Cardiac heart sounds are normal no murmurs gallops JVP is not elevated. Surgical incision is well approximated and appears to be healing well Abdomen no liver no spleen no tenderness GU bladder is not distended Musculoskeletal; patient is able to bring himself to a standing position balances slightly reduced however his gait is otherwise nondescript Mental status; he is listed as having mild dementia and that certainly seems to be where he would be currently. A vague on details, thought his surgery was done "here". How he was functioning at home prior to this will need to be determined before we can send him back  Impression/plan Coronary artery disease status post inferior MI and now CABG x4. He looks remarkably well #2 history of orthostatic hypotension newly diagnosed diabetic. I will check  this myself. Measuring this is often not want done well in facilities including hospitals #3 hyperlipidemia we'll monitor while he is here  #4 history of obstructive sleep apnea on CPAP #5 history of hyperthyroidism according to the discharge, however he is on Synthroid. He does not appear to have had prior surgery.  Patient does not appear to know anything about this diagnosis and I do not see a TSH on his record #6 postoperative anemia. Current hemoglobin  is 8.9 #7 renal insufficiency creatinine today is 1.4 these values will need to be followed  Results for Spraker, MATHEU PLOEGER (MRN [id number]) as of 12/29/2013 10:58  Ref. Range 12/20/2013 04:26 12/21/2013 04:00 12/22/2013 04:36 12/22/2013 10:20 12/23/2013 05:55  Sodium Latest Range: 137-147 mmol/L 143 140 143  145  Potassium Latest Range: 3.7-5.3 mEq/L 4.2 3.6 (L) 3.4 (L)  3.8  Chloride Latest Range: 96-112 mEq/L 107 103 106  107  CO2 Latest Range: 19-32 mEq/L 24 26 27  26   BUN Latest Range: 6-23 mg/dL 23 26 (H) 29 (H)  27 (H)  Creatinine Latest Range: 0.50-1.35 mg/dL 1.58 (H) 1.27 1.25  1.17  Calcium Latest Range: 8.4-10.5 mg/dL 8.3 (L) 8.5 8.4  8.8  GFR calc non Af Amer Latest Range: >90 mL/min 41 (L) 53 (L) 54 (L)  58 (L)  GFR calc Af Amer Latest Range: >90 mL/min 47 (L) 61 (L) 62 (L)  68 (L)  Glucose No range found 142 (H) 116 (H) 98  109 (H)  Anion gap Latest Range: 5-15  12 11 10  12   WBC Latest Range: 4.0-10.5 K/uL 12.8 (H) 10.8 (H) 8.1 9.0 7.1  RBC Latest Range: 4.22-5.81 MIL/uL 3.15 (L) 3.04 (L) 2.64 (L) 2.90 (L) 2.90 (L)  Hemoglobin Latest Range: 13.0-17.0 g/dL 9.2 (L) 9.0 (L) 7.8 (L) 8.5 (L) 8.5 (L)  HCT Latest Range: 39.0-52.0 % 27.8 (L) 26.8 (L) 23.2 (L) 25.7 (L) 25.7 (L)  MCV Latest Range: 78.0-100.0 fL 88.3 88.2 87.9 88.6 88.6  MCH Latest Range: 26.0-34.0 pg 29.2 29.6 29.5 29.3 29.3  MCHC Latest Range: 30.0-36.0 g/dL 33.1 33.6 33.6 33.1 33.1  RDW Latest Range: 11.5-15.5 % 13.5 13.7 14.0 14.1 14.3  Platelets Latest Range: 150-400  K/uL 120 (L) 132 (L) 148 (L) 174 196

## 2013-12-30 LAB — GLUCOSE, CAPILLARY
GLUCOSE-CAPILLARY: 118 mg/dL — AB (ref 70–99)
Glucose-Capillary: 119 mg/dL — ABNORMAL HIGH (ref 70–99)
Glucose-Capillary: 121 mg/dL — ABNORMAL HIGH (ref 70–99)
Glucose-Capillary: 153 mg/dL — ABNORMAL HIGH (ref 70–99)

## 2013-12-31 LAB — GLUCOSE, CAPILLARY
GLUCOSE-CAPILLARY: 120 mg/dL — AB (ref 70–99)
GLUCOSE-CAPILLARY: 132 mg/dL — AB (ref 70–99)
Glucose-Capillary: 125 mg/dL — ABNORMAL HIGH (ref 70–99)
Glucose-Capillary: 124 mg/dL — ABNORMAL HIGH (ref 70–99)

## 2014-01-01 LAB — GLUCOSE, CAPILLARY
Glucose-Capillary: 105 mg/dL — ABNORMAL HIGH (ref 70–99)
Glucose-Capillary: 92 mg/dL (ref 70–99)
Glucose-Capillary: 140 mg/dL — ABNORMAL HIGH (ref 70–99)

## 2014-01-02 LAB — GLUCOSE, CAPILLARY
GLUCOSE-CAPILLARY: 110 mg/dL — AB (ref 70–99)
Glucose-Capillary: 150 mg/dL — ABNORMAL HIGH (ref 70–99)
Glucose-Capillary: 117 mg/dL — ABNORMAL HIGH (ref 70–99)
Glucose-Capillary: 111 mg/dL — ABNORMAL HIGH (ref 70–99)
Glucose-Capillary: 116 mg/dL — ABNORMAL HIGH (ref 70–99)

## 2014-01-03 LAB — GLUCOSE, CAPILLARY
GLUCOSE-CAPILLARY: 120 mg/dL — AB (ref 70–99)
GLUCOSE-CAPILLARY: 126 mg/dL — AB (ref 70–99)
Glucose-Capillary: 109 mg/dL — ABNORMAL HIGH (ref 70–99)

## 2014-01-04 LAB — GLUCOSE, CAPILLARY
GLUCOSE-CAPILLARY: 99 mg/dL (ref 70–99)
Glucose-Capillary: 113 mg/dL — ABNORMAL HIGH (ref 70–99)
Glucose-Capillary: 89 mg/dL (ref 70–99)
Glucose-Capillary: 125 mg/dL — ABNORMAL HIGH (ref 70–99)

## 2014-01-05 LAB — GLUCOSE, CAPILLARY
Glucose-Capillary: 105 mg/dL — ABNORMAL HIGH (ref 70–99)
Glucose-Capillary: 97 mg/dL (ref 70–99)
Glucose-Capillary: 99 mg/dL (ref 70–99)
Glucose-Capillary: 124 mg/dL — ABNORMAL HIGH (ref 70–99)

## 2014-01-06 LAB — GLUCOSE, CAPILLARY
GLUCOSE-CAPILLARY: 112 mg/dL — AB (ref 70–99)
GLUCOSE-CAPILLARY: 107 mg/dL — AB (ref 70–99)
GLUCOSE-CAPILLARY: 119 mg/dL — AB (ref 70–99)
Glucose-Capillary: 137 mg/dL — ABNORMAL HIGH (ref 70–99)

## 2014-01-07 ENCOUNTER — Non-Acute Institutional Stay (SKILLED_NURSING_FACILITY): Payer: Medicare Other | Admitting: Internal Medicine

## 2014-01-07 DIAGNOSIS — I951 Orthostatic hypotension: Secondary | ICD-10-CM

## 2014-01-07 DIAGNOSIS — I2510 Atherosclerotic heart disease of native coronary artery without angina pectoris: Secondary | ICD-10-CM

## 2014-01-07 LAB — GLUCOSE, CAPILLARY
GLUCOSE-CAPILLARY: 108 mg/dL — AB (ref 70–99)
Glucose-Capillary: 119 mg/dL — ABNORMAL HIGH (ref 70–99)
Glucose-Capillary: 125 mg/dL — ABNORMAL HIGH (ref 70–99)

## 2014-01-08 LAB — GLUCOSE, CAPILLARY
Glucose-Capillary: 104 mg/dL — ABNORMAL HIGH (ref 70–99)
Glucose-Capillary: 138 mg/dL — ABNORMAL HIGH (ref 70–99)

## 2014-01-08 NOTE — Progress Notes (Addendum)
Patient ID: [name] [name], male   DOB: Mar 06, 1934, 80 y.o.   MRN: [id number]               PROGRESS NOTE  DATE:  01/07/2014    FACILITY: [facility]    LEVEL OF CARE:   SNF   Acute Visit   CHIEF COMPLAINT:  Review of diabetes and other issues.    HISTORY OF PRESENT ILLNESS:  [name] [name] is a gentleman who developed an inferior ST-elevation MI.  He underwent cardiac catheterization and underwent a CABG x4.  He has tolerated the surgery well.    He apparently had postoperative orthostasis; therefore, was not started on a beta blocker or ACE inhibitor.  He was put on midodrine.  He was transferred here for rehabilitation.    I think in work-up for the orthostasis, he was newly diagnosed as diabetic with a hemoglobin A1c of 6.7.  His blood sugars generally run in the 112-137 range.  He only triggers for a sliding scale later in the day.    Staff report that he probably will not be able to manage insulin, nor do I really think he needs it.    REVIEW OF SYSTEMS:   CHEST/RESPIRATORY:  He is not complaining of shortness of breath.   CARDIAC:   No chest pain.   GI:  No nausea or vomiting.    SKIN:  He is complaining of some pruritus around the surgical scar.    PHYSICAL EXAMINATION:   GENERAL APPEARANCE:  The patient looks well.  He is in no distress.   CHEST/RESPIRATORY:  Clear air entry bilaterally.   CARDIOVASCULAR:  CARDIAC:   Heart sounds are normal.  There are no murmurs.   GASTROINTESTINAL:  ABDOMEN:   Soft.   HERNIA:  There is a periumbilical hernia that reduces.   SKIN:  INSPECTION:  Some erythema around the lower part of his incision.  I do not believe that this is infectious.  No evidence of cellulitis.    ASSESSMENT/PLAN:  Coronary artery disease.  Status post CABG x4.  He appears to be very stable.  There is no evidence of congestive heart failure.    Newly diagnosed diabetes.  I do not think that he is going to require insulin, although I would like to  continue to monitor his blood sugars b.i.d.  I will discontinue the sliding scale.    Orthostatic hypotension.  I think this needs to be checked before he leaves.

## 2014-01-09 LAB — GLUCOSE, CAPILLARY
GLUCOSE-CAPILLARY: 128 mg/dL — AB (ref 70–99)
Glucose-Capillary: 116 mg/dL — ABNORMAL HIGH (ref 70–99)

## 2014-01-10 LAB — GLUCOSE, CAPILLARY
GLUCOSE-CAPILLARY: 113 mg/dL — AB (ref 70–99)
GLUCOSE-CAPILLARY: 144 mg/dL — AB (ref 70–99)

## 2014-01-11 LAB — GLUCOSE, CAPILLARY
GLUCOSE-CAPILLARY: 123 mg/dL — AB (ref 70–99)
Glucose-Capillary: 98 mg/dL (ref 70–99)

## 2014-01-12 LAB — GLUCOSE, CAPILLARY
Glucose-Capillary: 114 mg/dL — ABNORMAL HIGH (ref 70–99)
Glucose-Capillary: 121 mg/dL — ABNORMAL HIGH (ref 70–99)

## 2014-01-13 ENCOUNTER — Ambulatory Visit (INDEPENDENT_AMBULATORY_CARE_PROVIDER_SITE_OTHER): Payer: Medicare Other | Admitting: Cardiology

## 2014-01-13 ENCOUNTER — Encounter: Payer: Self-pay | Admitting: Cardiology

## 2014-01-13 VITALS — BP 98/62 | HR 73 | Ht 70.0 in | Wt 237.9 lb

## 2014-01-13 DIAGNOSIS — I2119 ST elevation (STEMI) myocardial infarction involving other coronary artery of inferior wall: Secondary | ICD-10-CM

## 2014-01-13 DIAGNOSIS — E785 Hyperlipidemia, unspecified: Secondary | ICD-10-CM

## 2014-01-13 DIAGNOSIS — I951 Orthostatic hypotension: Secondary | ICD-10-CM

## 2014-01-13 DIAGNOSIS — Z951 Presence of aortocoronary bypass graft: Secondary | ICD-10-CM

## 2014-01-13 DIAGNOSIS — D6489 Other specified anemias: Secondary | ICD-10-CM

## 2014-01-13 DIAGNOSIS — D649 Anemia, unspecified: Secondary | ICD-10-CM | POA: Insufficient documentation

## 2014-01-13 LAB — GLUCOSE, CAPILLARY
GLUCOSE-CAPILLARY: 119 mg/dL — AB (ref 70–99)
Glucose-Capillary: 131 mg/dL — ABNORMAL HIGH (ref 70–99)

## 2014-01-13 NOTE — Patient Instructions (Addendum)
Blood work tomorrow 01/14/14 at [facility]  ( bmet,cbc)   Continue same medications   Your physician recommends that you schedule a follow-up appointment in: 4 to 6 weeks with Dr.Berry  Tuesday 02/24/14 at 4:00 pm

## 2014-01-13 NOTE — Assessment & Plan Note (Signed)
Underwent cath then CABG, recovering slowly

## 2014-01-13 NOTE — Progress Notes (Signed)
01/13/2014   PCP: Purvis Kilts, MD   Chief Complaint  Patient presents with  . Follow-up    pt denies chest pain and swelling. pt states that he does experience sob    Primary Cardiologist:Dr. Adora Fridge   HPI:  80 year old pt here today s/P CABG X 4 12/18/13.  He has a history of remote smoking, hyperlipidemia, OSA on CPAP, and mild dementia who lives at home alone and cares for himself. He has noted feeling fatigued lately and not very active and had an episode of chest discomfort 2 nights ago that he thought was heartburn. Then last night he was awoken with 10/10 chest pain radiating to the right arm with nausea and diaphoresis. EMS was called and ECG showed inferior ST elevation with a troponin poc of 1.06. He was taken to the cath lab and the culprit appeared to be a severely diseased RCA with thrombotic appearing subtotal occlusions. There is also 95-99% stenosis in the large OM2 with diffuse disease in the proximal LAD and D1. The EF was mildly reduced with inferior hypokinesis and severely elevated LVEDP.  He did have some confusion and had orthostatic hypotension. He was discharged to SNF.  He is feeling much better.  Does therapy daily.  Still with some confusion.  Plan is for discharge next week.  His family will help him out at home.  Does complain of area on thigh hardened area, above VG harvesting site.  Mild discomfort.    Allergies  Allergen Reactions  . Escitalopram Other (See Comments)    Reaction is unknown: states sinus drainage. ( Patient is presently taking this medication)  . Statins Other (See Comments)    unknown    Current Outpatient Prescriptions  Medication Sig Dispense Refill  . traMADol (ULTRAM) 50 MG tablet Take 1 tablet (50 mg total) by mouth every 4 (four) hours as needed for moderate pain.  180 tablet  0   No current facility-administered medications for this visit.   See med list no longer on lasix and K+.  Past Medical  History  Diagnosis Date  . Epigastric pain   . Chronic constipation   . GERD (gastroesophageal reflux disease)   . Bladder tumor 09  . Hoarseness   . Hyperthyroidism   . Alzheimer disease   . Shortness of breath     Non-ischemic Myoview 08/2009  . Cancer     bladder tumor  . Arthritis   . Hyperlipidemia   . OSA on CPAP   . History of nuclear stress test 08/2009    negative for ischemia   . Dizziness   . Headache(784.0)   . Hypoxemia 11/19/2013  . Orthostatic hypotension 11/19/2013    Past Surgical History  Procedure Laterality Date  . Upper gastrointestinal endoscopy  04/06/2010    EGD ED  . Hemorrhoid surgery    . Hand surgery      right  . Wrist fracture surgery      pins placed-left  . Cystoscopy with biopsy  01/09/2012    Procedure: CYSTOSCOPY WITH BIOPSY;  Surgeon: Marissa Nestle, MD;  Location: [facility];  Service: Urology;  Laterality: N/A;  Cystoscopy with Multiple Bladder Biopsies  . Esophagogastroduodenoscopy (egd) with esophageal dilation N/A 01/08/2013    Procedure: ESOPHAGOGASTRODUODENOSCOPY (EGD) WITH ESOPHAGEAL DILATION;  Surgeon: Rogene Houston, MD;  Location: [facility];  Service: Endoscopy;  Laterality: N/A;  120  . Coronary artery bypass graft  N/A 12/18/2013    Procedure: Coronary artery bypass grafting times four using left internal mammary artery and endoscopically harvested right saphenous vein graft;  Surgeon: Gaye Pollack, MD;  Location: [facility];  Service: Open Heart Surgery;  Laterality: N/A;    JEH:UDJSHFW:YO colds or fevers, no weight changes Skin:no rashes or ulcers HEENT:no blurred vision, no congestion CV:see HPI-- no palpatations PUL:see HPI GI:no diarrhea constipation or melena, no indigestion GU:no hematuria, no dysuria MS:no joint pain, no claudication Neuro:no syncope, no lightheadedness Endo:+ diabetes, + thyroid disease  Wt Readings from Last 3 Encounters:  01/13/14 237 lb 14.4 oz (107.911 kg)  12/24/13 246 lb 7.6 oz (111.8 kg)    12/24/13 246 lb 7.6 oz (111.8 kg)    PHYSICAL EXAM BP 98/62  Pulse 73  Ht 5\' 10"  (1.778 m)  Wt 237 lb 14.4 oz (107.911 kg)  BMI 34.14 kg/m2 General:Pleasant affect, NAD Skin:Warm and dry, brisk capillary refill HEENT:normocephalic, sclera clear, mucus membranes moist Neck:supple, no JVD, no bruits  Heart:S1S2 RRR without murmur, gallup, rub or click, chest wall with healing incision no drainage. Lungs:clear without rales, rhonchi, or wheezes VZC:HYIF, non tender, + BS, do not palpate liver spleen or masses Ext:no lower ext edema, 2+ pedal pulses, 2+ radial pulses, rt upper thigh with hardened area- no erythema , small hematoma Neuro:alert and oriented, MAE, follows commands, + facial symmetry  EKG:SR rate 73, lateral ischemic changes have resolved from 9/11/5   ASSESSMENT AND PLAN S/P CABG x 4 Stable post CABG X 4 with 3. Coronary artery bypass grafting x 4  Left internal mammary graft to the LAD  SVG to diagonal  SVG to OM  SVG to PDA 4. Endoscopic vein harvest from the right leg    Stable, progressing well.  incision site stable.  ST elevation myocardial infarction (STEMI) of inferior wall, initial episode of care Underwent cath then CABG, recovering slowly  Orthostatic hypotension [facility], his BP stable. Today after riding back from [place] BP is lower at 98 but asymptomatic.   Continue ProAmatine.  Hyperlipidemia Allergy to statins  Anemia, post op On Iron, but also with constipation.  Will check H/H to see if improved, perhaps could stop Iron.

## 2014-01-13 NOTE — Assessment & Plan Note (Addendum)
At [facility], his BP stable. Today after riding back from [place] BP is lower at 98 but asymptomatic.   Continue ProAmatine.

## 2014-01-13 NOTE — Assessment & Plan Note (Signed)
Allergy to statins

## 2014-01-13 NOTE — Assessment & Plan Note (Addendum)
Stable post CABG X 4 with 3. Coronary artery bypass grafting x 4  Left internal mammary graft to the LAD  SVG to diagonal  SVG to OM  SVG to PDA 4. Endoscopic vein harvest from the right leg    Stable, progressing well.  incision site stable.

## 2014-01-13 NOTE — Assessment & Plan Note (Signed)
On Iron, but also with constipation.  Will check H/H to see if improved, perhaps could stop Iron.

## 2014-01-14 LAB — GLUCOSE, CAPILLARY
GLUCOSE-CAPILLARY: 128 mg/dL — AB (ref 70–99)
Glucose-Capillary: 128 mg/dL — ABNORMAL HIGH (ref 70–99)

## 2014-01-15 ENCOUNTER — Non-Acute Institutional Stay (SKILLED_NURSING_FACILITY): Payer: Medicare Other | Admitting: Internal Medicine

## 2014-01-15 DIAGNOSIS — I951 Orthostatic hypotension: Secondary | ICD-10-CM

## 2014-01-15 DIAGNOSIS — Z951 Presence of aortocoronary bypass graft: Secondary | ICD-10-CM

## 2014-01-15 LAB — GLUCOSE, CAPILLARY
GLUCOSE-CAPILLARY: 132 mg/dL — AB (ref 70–99)
Glucose-Capillary: 112 mg/dL — ABNORMAL HIGH (ref 70–99)

## 2014-01-16 LAB — GLUCOSE, CAPILLARY: Glucose-Capillary: 118 mg/dL — ABNORMAL HIGH (ref 70–99)

## 2014-01-20 DIAGNOSIS — I229 Subsequent ST elevation (STEMI) myocardial infarction of unspecified site: Secondary | ICD-10-CM

## 2014-01-20 DIAGNOSIS — I25709 Atherosclerosis of coronary artery bypass graft(s), unspecified, with unspecified angina pectoris: Secondary | ICD-10-CM

## 2014-01-20 DIAGNOSIS — Z48812 Encounter for surgical aftercare following surgery on the circulatory system: Secondary | ICD-10-CM

## 2014-01-20 DIAGNOSIS — I10 Essential (primary) hypertension: Secondary | ICD-10-CM

## 2014-01-20 NOTE — Progress Notes (Addendum)
Patient ID: [name] [name], male   DOB: July 13, 1933, 80 y.o.   MRN: [id number]               PROGRESS NOTE  DATE:  01/14/2014     FACILITY: [facility]    LEVEL OF CARE:   SNF   Acute Visit/Discharge Visit      CHIEF COMPLAINT:  Pre-discharge review.     HISTORY OF PRESENT ILLNESS:  Mr. [name] [name] is a gentleman who came to [place] after suffering an ST-elevation MI inferiorly.  He ultimately underwent a CABG x4.  He has done well.  He has no evidence of congestive heart failure.  He was recently reviewed by his cardiologist.    Also in hospital, he had orthostatic hypotension and was discharged on midodrine.  I checked that today, as well.  As part of the work-up for this, I believe he was diagnosed with type 2 diabetes based on a hemoglobin A1c of 6.7.  His blood sugars are in the 102-144 range.  I am not going to send him out on any current treatment.  However, this will need to be followed by his primary care physician.    REVIEW OF SYSTEMS:   CARDIAC:   He is not describing exertional chest pain or palpitations.   He is not describing orthostatic symptomatology.    PHYSICAL EXAMINATION:   GENERAL APPEARANCE:  The patient is not in any distress.   CHEST/RESPIRATORY:  Exam is clear.    CARDIOVASCULAR:  CARDIAC:   Heart sounds are normal.  There are no murmurs.  There are no gallops.  Jugular venous pressure is not elevated.    ORTHOSTATIC ANALYSIS:  His blood pressure supine is 130/70, dropping down to 70 standing although he is completely asymptomatic.  This will need to be carefully considered with adjustments of any medications, especially blood pressure medications, ACEs, ARBs, etc.     ASSESSMENT/PLAN:    Coronary artery disease/congestive heart failure.  No evidence that this is active.    Newly diagnosed diabetes.  I am not sending him home on any treatment.  This will require treatment monitoring by his primary physician.    Hypothyroidism.  I increased his Synthroid to  125 mg while he was here in response to a TSH of 19.299.  This will require a follow-up TSH in six weeks.    Orthostatic hypotension.  He is asymptomatic.  Currently on midodrine at 5 mg twice a day.  He does have a very significant drop which will need to be carefully considered if further medication adjustments are deemed necessary.    He appears to be stable.  He is going home with extensive family support.

## 2014-01-23 ENCOUNTER — Emergency Department (HOSPITAL_COMMUNITY): Payer: Medicare Other

## 2014-01-23 ENCOUNTER — Telehealth: Payer: Self-pay | Admitting: Cardiovascular Disease

## 2014-01-23 ENCOUNTER — Encounter (HOSPITAL_COMMUNITY): Payer: Self-pay | Admitting: Emergency Medicine

## 2014-01-23 ENCOUNTER — Observation Stay (HOSPITAL_COMMUNITY)
Admission: EM | Admit: 2014-01-23 | Discharge: 2014-01-25 | Disposition: A | Discharge status: Home / Self Care | Payer: Medicare Other | Attending: Internal Medicine | Admitting: Internal Medicine

## 2014-01-23 DIAGNOSIS — Z888 Allergy status to other drugs, medicaments and biological substances status: Secondary | ICD-10-CM | POA: Diagnosis not present

## 2014-01-23 DIAGNOSIS — G309 Alzheimer's disease, unspecified: Secondary | ICD-10-CM | POA: Insufficient documentation

## 2014-01-23 DIAGNOSIS — I4581 Long QT syndrome: Secondary | ICD-10-CM

## 2014-01-23 DIAGNOSIS — E785 Hyperlipidemia, unspecified: Secondary | ICD-10-CM | POA: Diagnosis not present

## 2014-01-23 DIAGNOSIS — F0390 Unspecified dementia without behavioral disturbance: Secondary | ICD-10-CM

## 2014-01-23 DIAGNOSIS — I2510 Atherosclerotic heart disease of native coronary artery without angina pectoris: Secondary | ICD-10-CM | POA: Diagnosis not present

## 2014-01-23 DIAGNOSIS — M1990 Unspecified osteoarthritis, unspecified site: Secondary | ICD-10-CM | POA: Diagnosis not present

## 2014-01-23 DIAGNOSIS — G459 Transient cerebral ischemic attack, unspecified: Secondary | ICD-10-CM

## 2014-01-23 DIAGNOSIS — Z7982 Long term (current) use of aspirin: Secondary | ICD-10-CM | POA: Insufficient documentation

## 2014-01-23 DIAGNOSIS — Z79899 Other long term (current) drug therapy: Secondary | ICD-10-CM | POA: Diagnosis not present

## 2014-01-23 DIAGNOSIS — R55 Syncope and collapse: Secondary | ICD-10-CM | POA: Insufficient documentation

## 2014-01-23 DIAGNOSIS — I951 Orthostatic hypotension: Principal | ICD-10-CM | POA: Insufficient documentation

## 2014-01-23 DIAGNOSIS — E119 Type 2 diabetes mellitus without complications: Secondary | ICD-10-CM | POA: Diagnosis not present

## 2014-01-23 DIAGNOSIS — E0590 Thyrotoxicosis, unspecified without thyrotoxic crisis or storm: Secondary | ICD-10-CM | POA: Insufficient documentation

## 2014-01-23 DIAGNOSIS — Z87891 Personal history of nicotine dependence: Secondary | ICD-10-CM | POA: Insufficient documentation

## 2014-01-23 DIAGNOSIS — K5900 Constipation, unspecified: Secondary | ICD-10-CM | POA: Diagnosis not present

## 2014-01-23 DIAGNOSIS — G4733 Obstructive sleep apnea (adult) (pediatric): Secondary | ICD-10-CM | POA: Insufficient documentation

## 2014-01-23 DIAGNOSIS — R9431 Abnormal electrocardiogram [ECG] [EKG]: Secondary | ICD-10-CM | POA: Diagnosis present

## 2014-01-23 DIAGNOSIS — R42 Dizziness and giddiness: Secondary | ICD-10-CM | POA: Insufficient documentation

## 2014-01-23 DIAGNOSIS — K219 Gastro-esophageal reflux disease without esophagitis: Secondary | ICD-10-CM | POA: Diagnosis not present

## 2014-01-23 DIAGNOSIS — Z951 Presence of aortocoronary bypass graft: Secondary | ICD-10-CM

## 2014-01-23 DIAGNOSIS — Z9861 Coronary angioplasty status: Secondary | ICD-10-CM | POA: Insufficient documentation

## 2014-01-23 DIAGNOSIS — Z794 Long term (current) use of insulin: Secondary | ICD-10-CM | POA: Diagnosis not present

## 2014-01-23 HISTORY — DX: Atherosclerotic heart disease of native coronary artery without angina pectoris: I25.10

## 2014-01-23 LAB — URINALYSIS, ROUTINE W REFLEX MICROSCOPIC
BILIRUBIN URINE: NEGATIVE
Glucose, UA: NEGATIVE mg/dL
Ketones, ur: NEGATIVE mg/dL
LEUKOCYTES UA: NEGATIVE
NITRITE: NEGATIVE
PH: 6 (ref 5.0–8.0)
PROTEIN: NEGATIVE mg/dL
Specific Gravity, Urine: 1.02 (ref 1.005–1.030)
UROBILINOGEN UA: 0.2 mg/dL (ref 0.0–1.0)

## 2014-01-23 LAB — BASIC METABOLIC PANEL
ANION GAP: 13 (ref 5–15)
BUN: 19 mg/dL (ref 6–23)
CO2: 24 mEq/L (ref 19–32)
Calcium: 9.9 mg/dL (ref 8.4–10.5)
Chloride: 99 mEq/L (ref 96–112)
Creatinine, Ser: 1.28 mg/dL (ref 0.50–1.35)
GFR calc Af Amer: 61 mL/min — ABNORMAL LOW (ref >90)
GFR, EST NON AFRICAN AMERICAN: 52 mL/min — AB (ref >90)
Glucose, Bld: 112 mg/dL — ABNORMAL HIGH (ref 70–99)
Potassium: 4.3 mEq/L (ref 3.7–5.3)
Sodium: 136 mEq/L — ABNORMAL LOW (ref 137–147)

## 2014-01-23 LAB — CBC WITH DIFFERENTIAL/PLATELET
BASOS ABS: 0.1 10*3/uL (ref 0.0–0.1)
BASOS PCT: 1 % (ref 0–1)
EOS ABS: 0.3 10*3/uL (ref 0.0–0.7)
Eosinophils Relative: 4 % (ref 0–5)
HCT: 38 % — ABNORMAL LOW (ref 39.0–52.0)
HEMOGLOBIN: 12.1 g/dL — AB (ref 13.0–17.0)
Lymphocytes Relative: 23 % (ref 12–46)
Lymphs Abs: 1.7 10*3/uL (ref 0.7–4.0)
MCH: 29.1 pg (ref 26.0–34.0)
MCHC: 31.8 g/dL (ref 30.0–36.0)
MCV: 91.3 fL (ref 78.0–100.0)
MONOS PCT: 8 % (ref 3–12)
Monocytes Absolute: 0.6 10*3/uL (ref 0.1–1.0)
NEUTROS ABS: 4.8 10*3/uL (ref 1.7–7.7)
Neutrophils Relative %: 64 % (ref 43–77)
PLATELETS: 311 10*3/uL (ref 150–400)
RBC: 4.16 MIL/uL — ABNORMAL LOW (ref 4.22–5.81)
RDW: 14.8 % (ref 11.5–15.5)
WBC: 7.3 10*3/uL (ref 4.0–10.5)

## 2014-01-23 LAB — GLUCOSE, CAPILLARY: GLUCOSE-CAPILLARY: 109 mg/dL — AB (ref 70–99)

## 2014-01-23 LAB — TROPONIN I: Troponin I: <0.3 ng/mL (ref <0.30)

## 2014-01-23 LAB — LACTIC ACID, PLASMA: Lactic Acid, Venous: 1.3 mmol/L (ref 0.5–2.2)

## 2014-01-23 LAB — URINE MICROSCOPIC-ADD ON

## 2014-01-23 MED ORDER — MIDODRINE HCL 5 MG PO TABS
10.0000 mg | ORAL_TABLET | Freq: Two times a day (BID) | ORAL | Status: DC
  Administered 2014-01-24 – 2014-01-25 (×4): 10 mg via ORAL
  Filled 2014-01-23 (×4): qty 2

## 2014-01-23 MED ORDER — ACETAMINOPHEN 650 MG RE SUPP: 650.0000 mg | Freq: Four times a day (QID) | RECTAL | Status: DC | PRN

## 2014-01-23 MED ORDER — BISACODYL 5 MG PO TBEC: 5.0000 mg | DELAYED_RELEASE_TABLET | Freq: Every day | ORAL | Status: DC | PRN

## 2014-01-23 MED ORDER — ACETAMINOPHEN 325 MG PO TABS: 650.0000 mg | ORAL_TABLET | Freq: Four times a day (QID) | ORAL | Status: DC | PRN

## 2014-01-23 MED ORDER — ENOXAPARIN SODIUM 40 MG/0.4ML ~~LOC~~ SOLN
40.0000 mg | SUBCUTANEOUS | Status: DC
  Administered 2014-01-23 – 2014-01-24 (×2): 40 mg via SUBCUTANEOUS
  Filled 2014-01-23 (×2): qty 0.4

## 2014-01-23 MED ORDER — SODIUM CHLORIDE 0.9 % IV SOLN: INTRAVENOUS | Status: DC

## 2014-01-23 MED ORDER — SODIUM CHLORIDE 0.9 % IJ SOLN
3.0000 mL | Freq: Two times a day (BID) | INTRAMUSCULAR | Status: DC
  Administered 2014-01-23 – 2014-01-25 (×4): 3 mL via INTRAVENOUS

## 2014-01-23 MED ORDER — POLYETHYLENE GLYCOL 3350 17 G PO PACK
17.0000 g | PACK | Freq: Every day | ORAL | Status: DC
  Administered 2014-01-23 – 2014-01-24 (×2): 17 g via ORAL
  Filled 2014-01-23 (×3): qty 1

## 2014-01-23 MED ORDER — ESCITALOPRAM OXALATE 10 MG PO TABS
20.0000 mg | ORAL_TABLET | Freq: Every day | ORAL | Status: DC
Start: 2014-01-24 — End: 2014-01-25
  Administered 2014-01-24 – 2014-01-25 (×2): 20 mg via ORAL
  Filled 2014-01-23 (×2): qty 2

## 2014-01-23 MED ORDER — MIDODRINE HCL 5 MG PO TABS: 5.0000 mg | ORAL_TABLET | Freq: Two times a day (BID) | ORAL | Status: DC

## 2014-01-23 MED ORDER — ONDANSETRON HCL 4 MG/2ML IJ SOLN: 4.0000 mg | Freq: Four times a day (QID) | INTRAMUSCULAR | Status: DC | PRN

## 2014-01-23 MED ORDER — FERROUS GLUCONATE 324 (38 FE) MG PO TABS
324.0000 mg | ORAL_TABLET | Freq: Two times a day (BID) | ORAL | Status: DC
  Administered 2014-01-23 – 2014-01-25 (×4): 324 mg via ORAL
  Filled 2014-01-23 (×6): qty 1

## 2014-01-23 MED ORDER — LEVOTHYROXINE SODIUM 112 MCG PO TABS
112.0000 ug | ORAL_TABLET | Freq: Every day | ORAL | Status: DC
  Administered 2014-01-24 – 2014-01-25 (×2): 112 ug via ORAL
  Filled 2014-01-23 (×2): qty 1

## 2014-01-23 MED ORDER — DONEPEZIL HCL 10 MG PO TABS
10.0000 mg | ORAL_TABLET | Freq: Every day | ORAL | Status: DC
Start: 2014-01-23 — End: 2014-01-25
  Administered 2014-01-23 – 2014-01-24 (×2): 10 mg via ORAL
  Filled 2014-01-23 (×2): qty 1

## 2014-01-23 MED ORDER — ASPIRIN EC 325 MG PO TBEC
325.0000 mg | DELAYED_RELEASE_TABLET | Freq: Every day | ORAL | Status: DC
  Administered 2014-01-24 – 2014-01-25 (×2): 325 mg via ORAL
  Filled 2014-01-23 (×2): qty 1

## 2014-01-23 MED ORDER — ONDANSETRON HCL 4 MG PO TABS: 4.0000 mg | ORAL_TABLET | Freq: Four times a day (QID) | ORAL | Status: DC | PRN

## 2014-01-23 MED ORDER — INSULIN ASPART 100 UNIT/ML ~~LOC~~ SOLN: 0.0000 [IU] | Freq: Three times a day (TID) | SUBCUTANEOUS | Status: DC

## 2014-01-23 MED ORDER — PANTOPRAZOLE SODIUM 40 MG PO TBEC
40.0000 mg | DELAYED_RELEASE_TABLET | Freq: Every morning | ORAL | Status: DC
Start: 2014-01-24 — End: 2014-01-25
  Administered 2014-01-24 – 2014-01-25 (×2): 40 mg via ORAL
  Filled 2014-01-23 (×2): qty 1

## 2014-01-23 NOTE — Progress Notes (Signed)
[facility]  Patient Status: Active (receiving services up to time of hospitalization)  [facility] is providing the following services: PT, OT and ST  If patient discharges after hours, please call [phone].   [name] [name] 01/23/2014, 5:51 PM

## 2014-01-23 NOTE — ED Notes (Addendum)
Pt got up this abour 615  am pt got disoriented and dizzy and confused  Laid on bed got better ,then got up again was still dizzy and  Ate breakfast  And felt better had OT. And he became syncopal again w/ low bp and felt like his head was tight but feels better , per family had BYPASS surgery sept 10. And slupped aND FELL 2 DAYS AGO  Pt pale

## 2014-01-23 NOTE — Telephone Encounter (Signed)
Received call from [place] she stated she is in patient's home.Stated patient went to bathroom this morning on his way back to bed he had a dizzy spell almost fell son caught patient.Patient had slurred speech.Stated his blood pressure is up and down.Patient don't feel good a fullness in head.Advised to take patient to [facility] ER.Trish called.

## 2014-01-23 NOTE — Progress Notes (Signed)
Pt arrived in 4N29. Pt in no acute distress. Pt placed on telemetry. Pt and family oriented to room. MD notified of pt's arrival. Will continue to monitor.

## 2014-01-23 NOTE — Consult Note (Addendum)
Referring Physician: Mcmanus    Chief Complaint: dizziness  HPI:                                                                                                                                         [name] [name] is an 80 y.o. male who recently underwent a CABG on 12-18-2013. Patient also recently was diagnosed with orthostatic hypotension back in September of 2015. He has been doing well with the occasional dizzy spells when standing up but states these spells will resolve if he sits down or slowly rises from a seated position.  This AM he awoke and went to the bathroom.  While walking to the bathroom he noted he felt off balance.  He did not vere to one particular side, but just felt off balance. HE states this episode was very much the same as previous episodes with the exception it lasted longer.  He states it lasted about 20-30 minutes and then subsided. Family is not at bedside, but per ED note there was associated slurred speech.  currently he has no slurred speech.   Orthostatics while in hospital negative.  Current BP 128/80  Date last known well: Date: 01/22/2014 Time last known well: Time: 06:15 tPA Given: No: symptoms resolved  Past Medical History  Diagnosis Date  . Epigastric pain   . Chronic constipation   . GERD (gastroesophageal reflux disease)   . Bladder tumor 09  . Hoarseness   . Hyperthyroidism   . Alzheimer disease   . Shortness of breath     Non-ischemic Myoview 08/2009  . Cancer     bladder tumor  . Arthritis   . Hyperlipidemia   . OSA on CPAP   . History of nuclear stress test 08/2009    negative for ischemia   . Dizziness   . Headache(784.0)   . Hypoxemia 11/19/2013  . Orthostatic hypotension 11/19/2013    Past Surgical History  Procedure Laterality Date  . Upper gastrointestinal endoscopy  04/06/2010    EGD ED  . Hemorrhoid surgery    . Hand surgery      right  . Wrist fracture surgery      pins placed-left  . Cystoscopy with biopsy  01/09/2012   Procedure: CYSTOSCOPY WITH BIOPSY;  Surgeon: Marissa Nestle, MD;  Location: [facility];  Service: Urology;  Laterality: N/A;  Cystoscopy with Multiple Bladder Biopsies  . Esophagogastroduodenoscopy (egd) with esophageal dilation N/A 01/08/2013    Procedure: ESOPHAGOGASTRODUODENOSCOPY (EGD) WITH ESOPHAGEAL DILATION;  Surgeon: Rogene Houston, MD;  Location[facility];  Service: Endoscopy;  Laterality: N/A;  120  . Coronary artery bypass graft N/A 12/18/2013    Procedure: Coronary artery bypass grafting times four using left internal mammary artery and endoscopically harvested right saphenous vein graft;  Surgeon: Gaye Pollack, MD;  Location: [facility];  Service: Open Heart Surgery;  Laterality: N/A;  .  Coronary artery bypass graft      Family History  Problem Relation Age of Onset  . Cancer - Lung Brother    Social History:  reports that he has quit smoking. His smoking use included Cigarettes. He has a 4 pack-year smoking history. He quit smokeless tobacco use about 54 years ago. His smokeless tobacco use included Chew. He reports that he does not drink alcohol or use illicit drugs.  Allergies:  Allergies  Allergen Reactions  . Escitalopram Other (See Comments)    Reaction is unknown: states sinus drainage. ( Patient is presently taking this medication)  . Statins Other (See Comments)    unknown    Medications:                                                                                                                           No current facility-administered medications for this encounter.   Current Outpatient Prescriptions  Medication Sig Dispense Refill  . aspirin EC 325 MG EC tablet Take 1 tablet (325 mg total) by mouth daily.  30 tablet  0  . bisacodyl (DULCOLAX) 5 MG EC tablet Take 5 mg by mouth daily as needed for moderate constipation.      [name] donepezil (ARICEPT) 10 MG tablet Take 10 mg by mouth at bedtime.      [name] escitalopram (LEXAPRO) 20 MG tablet Take 20 mg by mouth  daily.       . ferrous gluconate (FERGON) 324 MG tablet Take 1 tablet (324 mg total) by mouth 2 (two) times daily with a meal. For one month then stop.    3  . insulin aspart (NOVOLOG) 100 UNIT/ML injection Inject 0-24 Units into the skin 4 (four) times daily -  before meals and at bedtime. CBG  < 120 (dose in units): 0 units     CBG 120 - 160 (dose in units): 2        CBG 251 - 300 (dose in units): 12       CBG 161-200 (dose in units): 4       CBG 301 - 350 (dose in units) 16  CBG 201 - 250 (dose in units): 8       CBG 351 - 450 (dose in units): 20    CBG > 450 24 units and obtain STAT glucose and notify MD  10 mL  11  . levothyroxine (SYNTHROID, LEVOTHROID) 112 MCG tablet Take 112 mcg by mouth daily before breakfast.      . midodrine (PROAMATINE) 5 MG tablet Take 1 tablet (5 mg total) by mouth 2 (two) times daily with a meal.  60 tablet  3  . pantoprazole (PROTONIX) 40 MG tablet Take 40 mg by mouth every morning. ACID REFLUX/ GERD      . polyethylene glycol (MIRALAX / GLYCOLAX) packet Take 17 g by mouth at bedtime.         ROS:  History obtained from the patient  General ROS: negative for - chills, fatigue, fever, night sweats, weight gain or weight loss Psychological ROS: negative for - behavioral disorder, hallucinations, memory difficulties, mood swings or suicidal ideation Ophthalmic ROS: negative for - blurry vision, double vision, eye pain or loss of vision ENT ROS: negative for - epistaxis, nasal discharge, oral lesions, sore throat, tinnitus or vertigo Allergy and Immunology ROS: negative for - hives or itchy/watery eyes Hematological and Lymphatic ROS: negative for - bleeding problems, bruising or swollen lymph nodes Endocrine ROS: negative for - galactorrhea, hair pattern changes, polydipsia/polyuria or temperature intolerance Respiratory ROS:  negative for - cough, hemoptysis, shortness of breath or wheezing Cardiovascular ROS: negative for - chest pain, dyspnea on exertion, edema or irregular heartbeat Gastrointestinal ROS: negative for - abdominal pain, diarrhea, hematemesis, nausea/vomiting or stool incontinence Genito-Urinary ROS: negative for - dysuria, hematuria, incontinence or urinary frequency/urgency Musculoskeletal ROS: negative for - joint swelling or muscular weakness Neurological ROS: as noted in HPI Dermatological ROS: negative for rash and skin lesion changes  Neurologic Examination:                                                                                                      Blood pressure 111/58, pulse 71, temperature 98.5 F (36.9 C), temperature source Oral, resp. rate 24, SpO2 94.00%.   General: NAD Mental Status: Alert, oriented, thought content appropriate.  Speech fluent without evidence of aphasia.  Able to follow 3 step commands without difficulty. Cranial Nerves: II: Discs flat bilaterally; Visual fields grossly normal, pupils equal, round, reactive to light and accommodation III,IV, VI: ptosis not present, extra-ocular motions intact bilaterally V,VII: smile symmetric, facial light touch sensation normal bilaterally VIII: hearing normal bilaterally IX,X: gag reflex present XI: bilateral shoulder shrug XII: midline tongue extension without atrophy or fasciculations  Motor: Right : Upper extremity   5/5    Left:     Upper extremity   5/5  Lower extremity   5/5     Lower extremity   5/5 Tone and bulk:normal tone throughout; no atrophy noted Sensory: Pinprick and light touch intact throughout, bilaterally Deep Tendon Reflexes:  Right: Upper Extremity   Left: Upper extremity   biceps (C-5 to C-6) 2/4   biceps (C-5 to C-6) 2/4 tricep (C7) 2/4    triceps (C7) 2/4 Brachioradialis (C6) 2/4  Brachioradialis (C6) 2/4  Lower Extremity Lower Extremity  quadriceps (L-2 to L-4) 1/4   quadriceps  (L-2 to L-4) 1/4 Achilles (S1) 0/4   Achilles (S1) 0/4  Plantars: Right: downgoing   Left: downgoing Cerebellar: normal finger-to-nose,  normal heel-to-shin test Gait: wide based and shuffled.  CV: pulses palpable throughout    Lab Results: Basic Metabolic Panel:  Recent Labs Lab 01/23/14 1135  NA 136*  K 4.3  CL 99  CO2 24  GLUCOSE 112*  BUN 19  CREATININE 1.28  CALCIUM 9.9    Liver Function Tests: No results found for this basename: AST, ALT, ALKPHOS, BILITOT, PROT, ALBUMIN,  in the last 168 hours No results found for this basename:  LIPASE, AMYLASE,  in the last 168 hours No results found for this basename: AMMONIA,  in the last 168 hours  CBC:  Recent Labs Lab 01/23/14 1135  WBC 7.3  NEUTROABS 4.8  HGB 12.1*  HCT 38.0*  MCV 91.3  PLT 311    Cardiac Enzymes:  Recent Labs Lab 01/23/14 1135  TROPONINI <0.30    Lipid Panel: No results found for this basename: CHOL, TRIG, HDL, CHOLHDL, VLDL, LDLCALC,  in the last 168 hours  CBG: No results found for this basename: GLUCAP,  in the last 168 hours  Microbiology: Results for orders placed during the hospital encounter of 12/17/13  SURGICAL PCR SCREEN     Status: None   Collection Time    12/18/13 12:36 AM      Result Value Ref Range Status   MRSA, PCR NEGATIVE  NEGATIVE Final   Staphylococcus aureus NEGATIVE  NEGATIVE Final   Comment:            The Xpert SA Assay (FDA     approved for NASAL specimens     in patients over 51 years of age),     is one component of     a comprehensive surveillance     program.  Test performance has     been validated by [employer] for patients greater     than or equal to 39 year old.     It is not intended     to diagnose infection nor to     guide or monitor treatment.    Coagulation Studies: No results found for this basename: LABPROT, INR,  in the last 72 hours  Imaging: Dg Chest 2 View  01/23/2014   CLINICAL DATA:  Syncope.  Fell 2 days ago.   Recent bypass surgery .  EXAM: CHEST  2 VIEW  COMPARISON:  12/22/2013.  FINDINGS: Mediastinum and hilar structures are normal. Mild cardiomegaly with normal pulmonary vascularity. Prior CABG. Lungs are clear. No pleural effusion or pneumothorax. Stable right pleural thickening. No acute bony abnormality .  IMPRESSION: 1. Prior CABG.  Stable cardiomegaly, no CHF. 2. No acute pulmonary disease.  No acute bony abnormality.   Electronically Signed   By: Marcello Moores  Register   On: 01/23/2014 12:12   Ct Head Wo Contrast  01/23/2014   CLINICAL DATA:  Dizziness, near-syncope. Slurred speech. Full sensation in head.  EXAM: CT HEAD WITHOUT CONTRAST  TECHNIQUE: Contiguous axial images were obtained from the base of the skull through the vertex without intravenous contrast.  COMPARISON:  MRI 10/29/2013  FINDINGS: There is atrophy and chronic small vessel disease changes. No acute intracranial abnormality. Specifically, no hemorrhage, hydrocephalus, mass lesion, acute infarction, or significant intracranial injury. No acute calvarial abnormality.  Mucosal thickening in the right frontal sinus. No air-fluid levels. Mastoid air cells are clear.  IMPRESSION: No acute intracranial abnormality.  Atrophy, chronic microvascular disease.  Chronic right frontal sinusitis.   Electronically Signed   By: Rolm Baptise M.D.   On: 01/23/2014 12:06    Assessment and plan discussed with with attending physician and they are in agreement.    Etta Quill PA-C [facility] [phone]  01/23/2014, 2:00 PM   Assessment: 80 y.o. male  with a history of orthostasis who presents with symptoms consistent with orthostasis. He was not orthostatic when checked, but also did not develop symptoms when checked. He does not have any signs of parkinsonism, and his recent diagnosis of diabetes could  certainly be contributing. Given his recent MI with CABG, I would be hesitant to intentionally cause fluid retention(eg fludrocortisone)  without discussing with cardiology. I do not think that the symptoms described would be consistent with TIA.   Stroke Risk Factors - hyperlipidemia  1) Could increase midodrine or consider adding fludrocortisone, however given recent heart disease, would discuss with cardiology  2) Would consider assessing with EMG as outpatient.  3) Please call neurology with any further questions or concerns.   Roland Rack, MD [facility] [phone]  If 7pm- 7am, please page neurology on call as listed in [place].

## 2014-01-23 NOTE — ED Provider Notes (Signed)
CSN: [id number]     Arrival date & time 01/23/14  1108 History   First MD Initiated Contact with Patient 01/23/14 1127     Chief Complaint  Patient presents with  . Dizziness  . Fatigue  . Loss of Consciousness      Patient is a 80 y.o. male presenting with dizziness and syncope. The history is provided by a caregiver and the patient. The history is limited by the condition of the patient (Hx dementia).  Dizziness Associated symptoms: syncope   Loss of Consciousness Associated symptoms: dizziness   Pt was seen at 1155. Per pt's family and pt: Pt's son found pt "stumbling around his bedroom" and talking with "garbled" speech approximately 0615 this morning. Pt states he laid down and "felt better." Pt's family states he then walked to the living room, and sat down, telling his family he felt lightheaded "like I was about to pass out." Pt's daughter states pt's speech was "still slurred." Pt was able to eat breakfast without dysphagia. Pt states he has previously experienced these same symptoms and was dx with "low blood pressure." Pt's family states pt had a syncopal episode 2 days ago, but pt states "I don't think I passed out." Denies vertigo symptoms, no CP/palpitations, no SOB/cough, no abd pain, no N/V/D, no focal motor weakness, no tingling/numbness in extremities, no facial droop.    Past Medical History  Diagnosis Date  . Epigastric pain   . Chronic constipation   . GERD (gastroesophageal reflux disease)   . Bladder tumor 09  . Hoarseness   . Hyperthyroidism   . Alzheimer disease   . Shortness of breath     Non-ischemic Myoview 08/2009  . Cancer     bladder tumor  . Arthritis   . Hyperlipidemia   . OSA on CPAP   . History of nuclear stress test 08/2009    negative for ischemia   . Dizziness   . Headache(784.0)   . Hypoxemia 11/19/2013  . Orthostatic hypotension 11/19/2013  . Coronary artery disease    Past Surgical History  Procedure Laterality Date  . Upper  gastrointestinal endoscopy  04/06/2010    EGD ED  . Hemorrhoid surgery    . Hand surgery      right  . Wrist fracture surgery      pins placed-left  . Cystoscopy with biopsy  01/09/2012    Procedure: CYSTOSCOPY WITH BIOPSY;  Surgeon: Marissa Nestle, MD;  Location: [facility];  Service: Urology;  Laterality: N/A;  Cystoscopy with Multiple Bladder Biopsies  . Esophagogastroduodenoscopy (egd) with esophageal dilation N/A 01/08/2013    Procedure: ESOPHAGOGASTRODUODENOSCOPY (EGD) WITH ESOPHAGEAL DILATION;  Surgeon: Rogene Houston, MD;  Location: [facility];  Service: Endoscopy;  Laterality: N/A;  120  . Coronary artery bypass graft N/A 12/18/2013    Procedure: Coronary artery bypass grafting times four using left internal mammary artery and endoscopically harvested right saphenous vein graft;  Surgeon: Gaye Pollack, MD;  Location: [facility];  Service: Open Heart Surgery;  Laterality: N/A;  . Coronary artery bypass graft     Family History  Problem Relation Age of Onset  . Cancer - Lung Brother    History  Substance Use Topics  . Smoking status: Former Smoker -- 1.00 packs/day for 4 years    Types: Cigarettes  . Smokeless tobacco: Former Systems developer    Types: [place] date: 01/03/1960  . Alcohol Use: No    Review of Systems  Unable to perform ROS: Dementia  Cardiovascular: Positive for syncope.  Neurological: Positive for dizziness.      Allergies  Escitalopram and Statins  Home Medications   Prior to Admission medications   Medication Sig Start Date End Date Taking? Authorizing Provider  aspirin EC 325 MG EC tablet Take 1 tablet (325 mg total) by mouth daily. 12/24/13  Yes Donielle Liston Alba, PA-C  bisacodyl (DULCOLAX) 5 MG EC tablet Take 5 mg by mouth daily as needed for moderate constipation.   Yes Historical Provider, MD  donepezil (ARICEPT) 10 MG tablet Take 10 mg by mouth at bedtime.   Yes Historical Provider, MD  escitalopram (LEXAPRO) 20 MG tablet Take 20 mg by mouth  daily.    Yes Historical Provider, MD  ferrous gluconate (FERGON) 324 MG tablet Take 1 tablet (324 mg total) by mouth 2 (two) times daily with a meal. For one month then stop. 12/24/13  Yes Donielle Liston Alba, PA-C  insulin aspart (NOVOLOG) 100 UNIT/ML injection Inject 0-24 Units into the skin 4 (four) times daily -  before meals and at bedtime. CBG  < 120 (dose in units): 0 units     CBG 120 - 160 (dose in units): 2        CBG 251 - 300 (dose in units): 12       CBG 161-200 (dose in units): 4       CBG 301 - 350 (dose in units) 16  CBG 201 - 250 (dose in units): 8       CBG 351 - 450 (dose in units): 20    CBG > 450 24 units and obtain STAT glucose and notify MD 12/24/13  Yes Donielle Liston Alba, PA-C  levothyroxine (SYNTHROID, LEVOTHROID) 112 MCG tablet Take 112 mcg by mouth daily before breakfast.   Yes Historical Provider, MD  midodrine (PROAMATINE) 5 MG tablet Take 1 tablet (5 mg total) by mouth 2 (two) times daily with a meal. 11/19/13  Yes Asencion Partridge Dohmeier, MD  pantoprazole (PROTONIX) 40 MG tablet Take 40 mg by mouth every morning. ACID REFLUX/ GERD   Yes Historical Provider, MD  polyethylene glycol (MIRALAX / GLYCOLAX) packet Take 17 g by mouth at bedtime.   Yes Historical Provider, MD   BP 111/58  Pulse 71  Temp(Src) 98.5 F (36.9 C) (Oral)  Resp 24  SpO2 94% Physical Exam 1200: Physical examination:  Nursing notes reviewed; Vital signs and O2 SAT reviewed;  Constitutional: Well developed, Well nourished, In no acute distress; Head:  Normocephalic, atraumatic; Eyes: EOMI, PERRL, No scleral icterus; ENMT: Mouth and pharynx normal, Mucous membranes dry; Neck: Supple, Full range of motion, No lymphadenopathy; Cardiovascular: Regular rate and rhythm, No gallop; Respiratory: Breath sounds clear & equal bilaterally, No wheezes.  Speaking full sentences with ease, Normal respiratory effort/excursion; Chest: Nontender, Movement normal; Abdomen: Soft, Nontender, Nondistended, Normal bowel sounds;  Genitourinary: No CVA tenderness; Extremities: Pulses normal, No tenderness, No edema, No calf edema or asymmetry.; Neuro: Awake, alert, vague historian. Major CN grossly intact. No facial droop. Speech clear. No nystagmus. Grips equal. Strength 5/5 equal bilat UE's and LE's.  DTR 2/4 equal bilat UE's and LE's.  No gross sensory deficits.  Normal cerebellar testing bilat UE's (finger-nose) and LE's (heel-shin).; Skin: Color normal, Warm, Dry.   ED Course  Procedures    EKG Interpretation   Date/Time:  Friday January 23 2014 11:15:57 EDT Ventricular Rate:  79 PR Interval:  148 QRS Duration: 82 QT Interval:  408 QTC  Calculation: 467 R Axis:   74 Text Interpretation:  Normal sinus rhythm ST \\T \ T wave abnormality,  consider inferior ischemia Prolonged QT Abnormal ECG Baseline wander When  compared with ECG of 12/19/2013 QT has lengthened Otherwise no significant  change Confirmed by [facility]  MD, Nunzio Cory [phone]) on 01/23/2014 11:37:00  AM      MDM  MDM Reviewed: previous chart, nursing note and vitals Reviewed previous: labs and ECG Interpretation: labs, ECG, x-ray, CT scan and MRI    Results for orders placed during the hospital encounter of 01/23/14  URINALYSIS, ROUTINE W REFLEX MICROSCOPIC      Result Value Ref Range   Color, Urine YELLOW  YELLOW   APPearance CLEAR  CLEAR   Specific Gravity, Urine 1.020  1.005 - 1.030   pH 6.0  5.0 - 8.0   Glucose, UA NEGATIVE  NEGATIVE mg/dL   Hgb urine dipstick MODERATE (*) NEGATIVE   Bilirubin Urine NEGATIVE  NEGATIVE   Ketones, ur NEGATIVE  NEGATIVE mg/dL   Protein, ur NEGATIVE  NEGATIVE mg/dL   Urobilinogen, UA 0.2  0.0 - 1.0 mg/dL   Nitrite NEGATIVE  NEGATIVE   Leukocytes, UA NEGATIVE  NEGATIVE  CBC WITH DIFFERENTIAL      Result Value Ref Range   WBC 7.3  4.0 - 10.5 K/uL   RBC 4.16 (*) 4.22 - 5.81 MIL/uL   Hemoglobin 12.1 (*) 13.0 - 17.0 g/dL   HCT 38.0 (*) 39.0 - 52.0 %   MCV 91.3  78.0 - 100.0 fL   MCH 29.1  26.0 - 34.0  pg   MCHC 31.8  30.0 - 36.0 g/dL   RDW 14.8  11.5 - 15.5 %   Platelets 311  150 - 400 K/uL   Neutrophils Relative % 64  43 - 77 %   Neutro Abs 4.8  1.7 - 7.7 K/uL   Lymphocytes Relative 23  12 - 46 %   Lymphs Abs 1.7  0.7 - 4.0 K/uL   Monocytes Relative 8  3 - 12 %   Monocytes Absolute 0.6  0.1 - 1.0 K/uL   Eosinophils Relative 4  0 - 5 %   Eosinophils Absolute 0.3  0.0 - 0.7 K/uL   Basophils Relative 1  0 - 1 %   Basophils Absolute 0.1  0.0 - 0.1 K/uL  BASIC METABOLIC PANEL      Result Value Ref Range   Sodium 136 (*) 137 - 147 mEq/L   Potassium 4.3  3.7 - 5.3 mEq/L   Chloride 99  96 - 112 mEq/L   CO2 24  19 - 32 mEq/L   Glucose, Bld 112 (*) 70 - 99 mg/dL   BUN 19  6 - 23 mg/dL   Creatinine, Ser 1.28  0.50 - 1.35 mg/dL   Calcium 9.9  8.4 - 10.5 mg/dL   GFR calc non Af Amer 52 (*) >90 mL/min   GFR calc Af Amer 61 (*) >90 mL/min   Anion gap 13  5 - 15  LACTIC ACID, PLASMA      Result Value Ref Range   Lactic Acid, Venous 1.3  0.5 - 2.2 mmol/L  TROPONIN I      Result Value Ref Range   Troponin I <0.30  <0.30 ng/mL  URINE MICROSCOPIC-ADD ON      Result Value Ref Range   Squamous Epithelial / LPF RARE  RARE   WBC, UA 7-10  <3 WBC/hpf   RBC / HPF 21-50  <3 RBC/hpf  Dg Chest 2 View 01/23/2014   CLINICAL DATA:  Syncope.  Fell 2 days ago.  Recent bypass surgery .  EXAM: CHEST  2 VIEW  COMPARISON:  12/22/2013.  FINDINGS: Mediastinum and hilar structures are normal. Mild cardiomegaly with normal pulmonary vascularity. Prior CABG. Lungs are clear. No pleural effusion or pneumothorax. Stable right pleural thickening. No acute bony abnormality .  IMPRESSION: 1. Prior CABG.  Stable cardiomegaly, no CHF. 2. No acute pulmonary disease.  No acute bony abnormality.   Electronically Signed   By: Marcello Moores  Register   On: 01/23/2014 12:12   Ct Head Wo Contrast 01/23/2014   CLINICAL DATA:  Dizziness, near-syncope. Slurred speech. Full sensation in head.  EXAM: CT HEAD WITHOUT CONTRAST  TECHNIQUE:  Contiguous axial images were obtained from the base of the skull through the vertex without intravenous contrast.  COMPARISON:  MRI 10/29/2013  FINDINGS: There is atrophy and chronic small vessel disease changes. No acute intracranial abnormality. Specifically, no hemorrhage, hydrocephalus, mass lesion, acute infarction, or significant intracranial injury. No acute calvarial abnormality.  Mucosal thickening in the right frontal sinus. No air-fluid levels. Mastoid air cells are clear.  IMPRESSION: No acute intracranial abnormality.  Atrophy, chronic microvascular disease.  Chronic right frontal sinusitis.   Electronically Signed   By: Rolm Baptise M.D.   On: 01/23/2014 12:06   Mr Brain Wo Contrast 01/23/2014   CLINICAL DATA:  Episode of dizziness with near syncope. Slurred speech.  EXAM: MRI HEAD WITHOUT CONTRAST  TECHNIQUE: Multiplanar, multiecho pulse sequences of the brain and surrounding structures were obtained without intravenous contrast.  COMPARISON:  CT head from the same day.  MRI brain 10/29/2013.  FINDINGS: The diffusion-weighted images demonstrate no evidence for acute or subacute infarction. The study is mildly degraded by patient motion. Mild generalized atrophy is evident. Minimal periventricular white matter disease is within normal limits. Flow is present in the major intracranial arteries. The patient is status post bilateral lens replacements. The paranasal sinuses and mastoid air cells are clear.  IMPRESSION: 1. No acute intracranial abnormality or significant interval change. 2. Mild generalized atrophy is stable.   Electronically Signed   By: Lawrence Santiago M.D.   On: 01/23/2014 15:00    1545:  Neurology team has evaluated pt: MRI is negative for acute CVA. Pt's neuro exam remains intact and unchanged. Pt is not orthostatic. Dx and testing d/w pt and family.  Questions answered.  Verb understanding, agreeable to admit.  T/C to [facility] [place] Pink, case discussed, including:  HPI, pertinent  PM/SHx, VS/PE, dx testing, ED course and treatment:  Agreeable to admit, requests to write temporary orders, obtain observation tele bed to team 10.   Francine Graven, DO 01/26/14 [phone]

## 2014-01-23 NOTE — ED Notes (Signed)
Patient transported to MRI 

## 2014-01-23 NOTE — ED Notes (Signed)
Called lab to find out why pts urine had not results. Lab said that they could not locate the urine. This NT went in to recollect and resend to lab.

## 2014-01-23 NOTE — H&P (Addendum)
History and Physical  [name] [name] GHW:[id number] DOB: October 13, 1933 DOA: 01/23/2014  Referring physician: Lurene [name], ER physician PCP: [name] Kilts, MD   Chief Complaint: Dizziness  HPI: [name] [name] is a 80 y.o. male  Past medical history of obstructive sleep apnea, mild dementia and status post CABG approximately 6 weeks ago who is been in a skilled nursing facility until a week ago when he was discharged home. Patient also has a history of orthostatic hypotension on midodrine. Family had noted that he continues to have issues when standing for prolonged, special physical therapy where he gets lightheaded and has to sit back down. Today, the patient himself felt so dizzy he felt like he was going to fall back in stayed in the bathroom on the commode. He did not pass out. Family was concerned because they also feel that he had some slurred speech. He was brought into the emergency room for further evaluation. In the emergency room, labs were unremarkable, CT scan of head and MRI were unrevealing patient was brought in for further evaluation.   Review of Systems:  Patient seen after arrival to floor. Pt complains of dizziness/lightheadedness -- not room spinning.  Pt denies any headaches, vision changes, dysphagia, chest pain, palpitations, shortness of breath, wheeze, cough, abdominal pain, hematuria, dysuria, constipation, diarrhea, focal extremity numbness weakness or pain.  Review of systems are otherwise negative  Past Medical History  Diagnosis Date  . Epigastric pain   . Chronic constipation   . GERD (gastroesophageal reflux disease)   . Bladder tumor 09  . Hoarseness   . Hyperthyroidism   . Alzheimer disease   . Shortness of breath     Non-ischemic Myoview 08/2009  . Cancer     bladder tumor  . Arthritis   . Hyperlipidemia   . OSA on CPAP   . History of nuclear stress test 08/2009    negative for ischemia   . Dizziness   . Headache(784.0)   . Hypoxemia  11/19/2013  . Orthostatic hypotension 11/19/2013  . Coronary artery disease    Past Surgical History  Procedure Laterality Date  . Upper gastrointestinal endoscopy  04/06/2010    EGD ED  . Hemorrhoid surgery    . Hand surgery      right  . Wrist fracture surgery      pins placed-left  . Cystoscopy with biopsy  01/09/2012    Procedure: CYSTOSCOPY WITH BIOPSY;  Surgeon: Marissa Nestle, MD;  Location: [facility];  Service: Urology;  Laterality: N/A;  Cystoscopy with Multiple Bladder Biopsies  . Esophagogastroduodenoscopy (egd) with esophageal dilation N/A 01/08/2013    Procedure: ESOPHAGOGASTRODUODENOSCOPY (EGD) WITH ESOPHAGEAL DILATION;  Surgeon: Rogene Houston, MD;  Location: [facility];  Service: Endoscopy;  Laterality: N/A;  120  . Coronary artery bypass graft N/A 12/18/2013    Procedure: Coronary artery bypass grafting times four using left internal mammary artery and endoscopically harvested right saphenous vein graft;  Surgeon: Gaye Pollack, MD;  Location: [facility];  Service: Open Heart Surgery;  Laterality: N/A;  . Coronary artery bypass graft     Social History:  reports that he has quit smoking. His smoking use included Cigarettes. He has a 4 pack-year smoking history. He quit smokeless tobacco use about 54 years ago. His smokeless tobacco use included Chew. He reports that he does not drink alcohol or use illicit drugs. Patient lives at home with his girlfriend, son and daughter-in-law & is able to participate in activities  of daily living without use of a walker or cane, but limited duty dizziness spells  Allergies  Allergen Reactions  . Escitalopram Other (See Comments)    Reaction is unknown: states sinus drainage. ( Patient is presently taking this medication)  . Statins Other (See Comments)    unknown    Family History  Problem Relation Age of Onset  . Cancer - Lung Brother     As confirmed by patient  Prior to Admission medications   Medication Sig Start Date End  Date Taking? Authorizing Provider  aspirin EC 325 MG EC tablet Take 1 tablet (325 mg total) by mouth daily. 12/24/13  Yes Donielle Liston Alba, PA-C  bisacodyl (DULCOLAX) 5 MG EC tablet Take 5 mg by mouth daily as needed for moderate constipation.   Yes Historical Provider, MD  donepezil (ARICEPT) 10 MG tablet Take 10 mg by mouth at bedtime.   Yes Historical Provider, MD  escitalopram (LEXAPRO) 20 MG tablet Take 20 mg by mouth daily.    Yes Historical Provider, MD  ferrous gluconate (FERGON) 324 MG tablet Take 1 tablet (324 mg total) by mouth 2 (two) times daily with a meal. For one month then stop. 12/24/13  Yes Donielle Liston Alba, PA-C  insulin aspart (NOVOLOG) 100 UNIT/ML injection Inject 0-24 Units into the skin 4 (four) times daily -  before meals and at bedtime. CBG  < 120 (dose in units): 0 units     CBG 120 - 160 (dose in units): 2        CBG 251 - 300 (dose in units): 12       CBG 161-200 (dose in units): 4       CBG 301 - 350 (dose in units) 16  CBG 201 - 250 (dose in units): 8       CBG 351 - 450 (dose in units): 20    CBG > 450 24 units and obtain STAT glucose and notify MD 12/24/13  Yes Donielle Liston Alba, PA-C  levothyroxine (SYNTHROID, LEVOTHROID) 112 MCG tablet Take 112 mcg by mouth daily before breakfast.   Yes Historical Provider, MD  midodrine (PROAMATINE) 5 MG tablet Take 1 tablet (5 mg total) by mouth 2 (two) times daily with a meal. 11/19/13  Yes Asencion Partridge Dohmeier, MD  pantoprazole (PROTONIX) 40 MG tablet Take 40 mg by mouth every morning. ACID REFLUX/ GERD   Yes Historical Provider, MD  polyethylene glycol (MIRALAX / GLYCOLAX) packet Take 17 g by mouth at bedtime.   Yes Historical Provider, MD    Physical Exam: BP 139/75  Pulse 75  Temp(Src) 98.1 F (36.7 C) (Oral)  Resp 17  SpO2 100%  General:  Alert and oriented x3, no acute distress Eyes: Sclera nonicteric, extraocular movements are intact ENT: Normocephalic, atraumatic, mucous members are moist Neck: No carotid  bruits Cardiovascular: Regular rate and rhythm, S1-S2 Respiratory: Clear auscultation bilaterally Abdomen: Soft, nontender, nondistended, positive bowel sounds Skin: No skin breaks, tears or lesions Musculoskeletal: No clubbing or cyanosis, trace edema Psychiatric: Patient is appropriate, no evidence of psychoses Neurologic: No focal deficits. With initial standing, does not get lightheaded. However with prolonged standing starts to feel quite lightheaded and needs to sit down           Labs on Admission:  Basic Metabolic Panel:  Recent Labs Lab 01/23/14 1135  NA 136*  K 4.3  CL 99  CO2 24  GLUCOSE 112*  BUN 19  CREATININE 1.28  CALCIUM 9.9   Liver  Function Tests: No results found for this basename: AST, ALT, ALKPHOS, BILITOT, PROT, ALBUMIN,  in the last 168 hours No results found for this basename: LIPASE, AMYLASE,  in the last 168 hours No results found for this basename: AMMONIA,  in the last 168 hours CBC:  Recent Labs Lab 01/23/14 1135  WBC 7.3  NEUTROABS 4.8  HGB 12.1*  HCT 38.0*  MCV 91.3  PLT 311   Cardiac Enzymes:  Recent Labs Lab 01/23/14 1135  TROPONINI <0.30    BNP (last 3 results) No results found for this basename: PROBNP,  in the last 8760 hours CBG: No results found for this basename: GLUCAP,  in the last 168 hours  Radiological Exams on Admission: Dg Chest 2 View  01/23/2014   CLINICAL DATA:  Syncope.  Fell 2 days ago.  Recent bypass surgery .  EXAM: CHEST  2 VIEW  COMPARISON:  12/22/2013.  FINDINGS: Mediastinum and hilar structures are normal. Mild cardiomegaly with normal pulmonary vascularity. Prior CABG. Lungs are clear. No pleural effusion or pneumothorax. Stable right pleural thickening. No acute bony abnormality .  IMPRESSION: 1. Prior CABG.  Stable cardiomegaly, no CHF. 2. No acute pulmonary disease.  No acute bony abnormality.   Electronically Signed   By: Marcello Moores  Register   On: 01/23/2014 12:12   Ct Head Wo Contrast  01/23/2014    CLINICAL DATA:  Dizziness, near-syncope. Slurred speech. Full sensation in head.  EXAM: CT HEAD WITHOUT CONTRAST  TECHNIQUE: Contiguous axial images were obtained from the base of the skull through the vertex without intravenous contrast.  COMPARISON:  MRI 10/29/2013  FINDINGS: There is atrophy and chronic small vessel disease changes. No acute intracranial abnormality. Specifically, no hemorrhage, hydrocephalus, mass lesion, acute infarction, or significant intracranial injury. No acute calvarial abnormality.  Mucosal thickening in the right frontal sinus. No air-fluid levels. Mastoid air cells are clear.  IMPRESSION: No acute intracranial abnormality.  Atrophy, chronic microvascular disease.  Chronic right frontal sinusitis.   Electronically Signed   By: Rolm Baptise M.D.   On: 01/23/2014 12:06   Mr Brain Wo Contrast  01/23/2014   CLINICAL DATA:  Episode of dizziness with near syncope. Slurred speech.  EXAM: MRI HEAD WITHOUT CONTRAST  TECHNIQUE: Multiplanar, multiecho pulse sequences of the brain and surrounding structures were obtained without intravenous contrast.  COMPARISON:  CT head from the same day.  MRI brain 10/29/2013.  FINDINGS: The diffusion-weighted images demonstrate no evidence for acute or subacute infarction. The study is mildly degraded by patient motion. Mild generalized atrophy is evident. Minimal periventricular white matter disease is within normal limits. Flow is present in the major intracranial arteries. The patient is status post bilateral lens replacements. The paranasal sinuses and mastoid air cells are clear.  IMPRESSION: 1. No acute intracranial abnormality or significant interval change. 2. Mild generalized atrophy is stable.   Electronically Signed   By: Lawrence Santiago M.D.   On: 01/23/2014 15:00    EKG: Independently reviewed. Prolonged QT  Assessment/Plan Present on Admission:  . Dementia without behavioral disturbance: Actually stable. Continue Aricept  .  Orthostatic hypotension: Chronic with an acute component. Explained to family may not be able to solve this. We'll try to increase midodrine to 10 mg twice a day from 5 twice a day. Repeat orthostatics in the morning, get physical therapy to see. Sleep apnea may be be a component of this.  . Obstructive sleep apnea: Family tells me he has a hard time with CPAP machine in  at midnight he intentionally or unintentionally takes it off. We'll try to continue it here. Chronic hypoxia may be accounting for some of his orthostasis as well as lightheadedness symptoms.  . Prolonged Q-T interval on ECG: Noted increased from previous EKG. Patient couple of medications that could account for this including Lexapro, midodrine and Vesicare. Would favor holding his Vesicare for now. Has not been on his Lexapro in over a week. Repeat EKG in the morning. I do not think that this is related to his symptoms earlier today.  History bladder tumor: On Vesicare, currently not listed on his med rec  Diabetes mellitus type 2 without complication: Diet controlled. On sliding scale  Consultants: Neurology  Code Status: Full code  Family Communication: Multiple family members at the bedside   Disposition Plan: Possibly home tomorrow  Time spent: 63 minutes  [facility] Pager [phone]

## 2014-01-24 DIAGNOSIS — K219 Gastro-esophageal reflux disease without esophagitis: Secondary | ICD-10-CM | POA: Diagnosis not present

## 2014-01-24 DIAGNOSIS — R55 Syncope and collapse: Secondary | ICD-10-CM | POA: Diagnosis not present

## 2014-01-24 DIAGNOSIS — I951 Orthostatic hypotension: Secondary | ICD-10-CM | POA: Diagnosis not present

## 2014-01-24 DIAGNOSIS — I2510 Atherosclerotic heart disease of native coronary artery without angina pectoris: Secondary | ICD-10-CM

## 2014-01-24 DIAGNOSIS — R42 Dizziness and giddiness: Secondary | ICD-10-CM | POA: Diagnosis not present

## 2014-01-24 LAB — CBC
HCT: 34.6 % — ABNORMAL LOW (ref 39.0–52.0)
Hemoglobin: 11.2 g/dL — ABNORMAL LOW (ref 13.0–17.0)
MCH: 29.6 pg (ref 26.0–34.0)
MCHC: 32.4 g/dL (ref 30.0–36.0)
MCV: 91.3 fL (ref 78.0–100.0)
PLATELETS: 280 10*3/uL (ref 150–400)
RBC: 3.79 MIL/uL — AB (ref 4.22–5.81)
RDW: 14.8 % (ref 11.5–15.5)
WBC: 6.7 10*3/uL (ref 4.0–10.5)

## 2014-01-24 LAB — BASIC METABOLIC PANEL
ANION GAP: 10 (ref 5–15)
BUN: 18 mg/dL (ref 6–23)
CALCIUM: 9.6 mg/dL (ref 8.4–10.5)
CO2: 25 mEq/L (ref 19–32)
Chloride: 102 mEq/L (ref 96–112)
Creatinine, Ser: 1.28 mg/dL (ref 0.50–1.35)
GFR, EST AFRICAN AMERICAN: 60 mL/min — AB (ref >90)
GFR, EST NON AFRICAN AMERICAN: 52 mL/min — AB (ref >90)
Glucose, Bld: 107 mg/dL — ABNORMAL HIGH (ref 70–99)
POTASSIUM: 4.9 meq/L (ref 3.7–5.3)
SODIUM: 137 meq/L (ref 137–147)

## 2014-01-24 LAB — GLUCOSE, CAPILLARY
GLUCOSE-CAPILLARY: 112 mg/dL — AB (ref 70–99)
Glucose-Capillary: 110 mg/dL — ABNORMAL HIGH (ref 70–99)
Glucose-Capillary: 118 mg/dL — ABNORMAL HIGH (ref 70–99)
Glucose-Capillary: 117 mg/dL — ABNORMAL HIGH (ref 70–99)

## 2014-01-24 LAB — URINE CULTURE: Colony Count: 5000

## 2014-01-24 MED ORDER — FLUDROCORTISONE ACETATE 0.1 MG PO TABS
0.1000 mg | ORAL_TABLET | Freq: Every day | ORAL | Status: DC
  Administered 2014-01-24 – 2014-01-25 (×2): 0.1 mg via ORAL
  Filled 2014-01-24 (×2): qty 1

## 2014-01-24 MED ORDER — SODIUM CHLORIDE 0.9 % IV BOLUS (SEPSIS)
500.0000 mL | Freq: Once | INTRAVENOUS | Status: AC
  Administered 2014-01-24: 500 mL via INTRAVENOUS

## 2014-01-24 NOTE — Evaluation (Signed)
Physical Therapy Evaluation/ Discharge Patient Details Name: [name] [name] MRN: [id number] DOB: 12/10/1933 Today's Date: 01/24/2014   History of Present Illness  Past medical history of obstructive sleep apnea, mild dementia and status post CABG approximately 6 weeks ago who is been in a skilled nursing facility until a week ago when he was discharged home. Patient also has a history of orthostatic hypotension on midodrine. Family had noted that he continues to have issues when standing for prolonged periods  Clinical Impression  Pt pleasant with decreased balance and inability to maintain target with visual tracking or saccades. Pt unaware of visual deficit and educated for function and balance deficits. Pt encouraged to have rail or assist for all stepping activities, to use cane and to pursue further balance education and training with home P.T. All acute education completed, pt without dizziness with mobility at any time today and encouraged to continue with slow transitions to prevent orthostatic hypotension as noted in chart. Pt and son present and aware of all above with agreement for no further acute needs.     Follow Up Recommendations Home health PT    Equipment Recommendations  None recommended by PT    Recommendations for Other Services       Precautions / Restrictions Precautions Precautions: Fall Restrictions Weight Bearing Restrictions: No      Mobility  Bed Mobility Overal bed mobility: Modified Independent                Transfers Overall transfer level: Modified independent                  Ambulation/Gait Ambulation/Gait assistance: Modified independent (Device/Increase time) Ambulation Distance (Feet): 300 Feet Assistive device: None Gait Pattern/deviations: WFL(Within Functional Limits)   Gait velocity interpretation: Below normal speed for age/gender General Gait Details: pt able to complete vertical and horizontal head turns without LOB  or change in gait with maintained decreased speed  Stairs            Wheelchair Mobility    Modified Rankin (Stroke Patients Only)       Balance Overall balance assessment: Needs assistance   Sitting balance-Leahy Scale: Good       Standing balance-Leahy Scale: Good   Single Leg Stance - Right Leg: 2 Single Leg Stance - Left Leg: 3 Tandem Stance - Right Leg: 3 Tandem Stance - Left Leg: 3 Rhomberg - Eyes Opened: 20 Rhomberg - Eyes Closed: 10   High Level Balance Comments: pt able to complete head turns without deficit, 360degree turn in 6 sec slowly but safely             Pertinent Vitals/Pain Pain Assessment: No/denies pain    Home Living Family/patient expects to be discharged to:: Private residence Living Arrangements: Children;Other relatives Available Help at Discharge: Family;Available 24 hours/day Type of Home: House Home Access: Stairs to enter   [employer] of Steps: 1 Home Layout: One level Home Equipment: Cane - single point Additional Comments: family reports they are installing a grab bar at shower and seat     Prior Function Level of Independence: Independent         Comments: pt reports family assist with cooking, housework and driving but that all mobility and ADLs are independent. With report of 1 fall this year     Hand Dominance        Extremity/Trunk Assessment   Upper Extremity Assessment: Overall WFL for tasks assessed  Lower Extremity Assessment: Overall WFL for tasks assessed      Cervical / Trunk Assessment: Normal  Communication   Communication: No difficulties  Cognition Arousal/Alertness: Awake/alert Behavior During Therapy: WFL for tasks assessed/performed Overall Cognitive Status: Within Functional Limits for tasks assessed                      General Comments      Exercises        Assessment/Plan    PT Assessment All further PT needs can be met in the next  venue of care  PT Diagnosis Abnormality of gait   PT Problem List Decreased balance  PT Treatment Interventions     PT Goals (Current goals can be found in the Care Plan section) Acute Rehab PT Goals PT Goal Formulation: All assessment and education complete, DC therapy    Frequency     Barriers to discharge        Co-evaluation               End of Session   Activity Tolerance: Patient tolerated treatment well Patient left: in chair;with call bell/phone within reach;with family/visitor present Nurse Communication: Mobility status    Functional Assessment Tool Used: clinical judgement Functional Limitation: Mobility: Walking and moving around Mobility: Walking and Moving Around Current Status (Y1948): At least 1 percent but less than 20 percent impaired, limited or restricted Mobility: Walking and Moving Around Goal Status [phone]): At least 1 percent but less than 20 percent impaired, limited or restricted Mobility: Walking and Moving Around Discharge Status [id number]): At least 1 percent but less than 20 percent impaired, limited or restricted    Time: 0909-0936 PT Time Calculation (min): 27 min   Charges:   PT Evaluation $Initial PT Evaluation Tier I: 1 Procedure PT Treatments $Therapeutic Activity: 8-22 mins   PT G Codes:   Functional Assessment Tool Used: clinical judgement Functional Limitation: Mobility: Walking and moving around    Melford Aase 01/24/2014, 10:44 AM Elwyn Reach, [place]

## 2014-01-24 NOTE — Progress Notes (Signed)
[facility] PROGRESS NOTE  [name] [name] VZC:[id number] DOB: 21-Jan-1934 DOA: 01/23/2014 PCP: Purvis Kilts, MD  Assessment/Plan: 1. Orthostatic hypotension -Patient with history of orthostatic hypotension, previously on midodrine 5 mg Po BID -Midodrine was increased to 10 mg by mouth twice a day -He remains significantly orthostatic as his SBP came down from 129 on laying to 61 on standing. -Will bolus with 500 mL of NS and add fludrocortisone 0.1 mg PO q daily -Repeat orthostatics in am   2.  Coronary Artery Disease -Status post coronary artery bypass grafting -He is fairly chest pain-free, initial workup included a troponin which was negative. -Continue aspirin therapy  3.  QT prolongation -His QTc slightlycoming down to 467 from 471  4.  Hypothyroidism -Continue Synthroid 112 mcg by mouth daily  Code Status: full code Family Communication: I spoke to his son present at bedside Disposition Plan: Anticipate discharge home when medically stable    HPI/Subjective: Patient is a pleasant 80 year old gentleman with a past medical history of coronary artery disease status post coronary artery bypass grafting 6 weeks ago, history of orthostatic hypotension but been treated with Midodrine, presented to the emergency room on 01/23/2014 presenting with increased dizziness. Family members reporting patient having worsening positional dizziness from his baseline. CT scan of head showed no acute intracranial abnormality. He was further worked up with MRI of brain which did not reveal acute intracranial changes. Orthostatics were repeated on the following morning showing that his systolic blood pressure came down from 129 on lying to 61 on standing. Given significant change Florinef was added.   Objective: Filed Vitals:   01/24/14 1353  BP: 130/61  Pulse: 69  Temp: 98.3 F (36.8 C)  Resp: 20    Intake/Output Summary (Last 24 hours) at 01/24/14 1554 Last data filed at  01/24/14 0900  Gross per 24 hour  Intake    363 ml  Output    200 ml  Net    163 ml   Filed Weights   01/23/14 1700  Weight: 107.9 kg (237 lb 14 oz)    Exam:   General:  Patient is in acute distress, reports feeling dizzy when he stands up  Cardiovascular: Regular rate and rhythm normal S1-S2  Respiratory: normal respiratory effort, lungs are clear to auscultation  Abdomen: soft nontender nondistended  Musculoskeletal: no edema  Data Reviewed: Basic Metabolic Panel:  Recent Labs Lab 01/23/14 1135 01/24/14 0428  NA 136* 137  K 4.3 4.9  CL 99 102  CO2 24 25  GLUCOSE 112* 107*  BUN 19 18  CREATININE 1.28 1.28  CALCIUM 9.9 9.6   Liver Function Tests: No results found for this basename: AST, ALT, ALKPHOS, BILITOT, PROT, ALBUMIN,  in the last 168 hours No results found for this basename: LIPASE, AMYLASE,  in the last 168 hours No results found for this basename: AMMONIA,  in the last 168 hours CBC:  Recent Labs Lab 01/23/14 1135 01/24/14 0428  WBC 7.3 6.7  NEUTROABS 4.8  --   HGB 12.1* 11.2*  HCT 38.0* 34.6*  MCV 91.3 91.3  PLT 311 280   Cardiac Enzymes:  Recent Labs Lab 01/23/14 1135  TROPONINI <0.30   BNP (last 3 results) No results found for this basename: PROBNP,  in the last 8760 hours CBG:  Recent Labs Lab 01/23/14 2107 01/24/14 0649 01/24/14 1138  GLUCAP 109* 112* 110*    No results found for this or any previous visit (from the past 240 hour(s)).  Studies: Dg Chest 2 View  01/23/2014   CLINICAL DATA:  Syncope.  Fell 2 days ago.  Recent bypass surgery .  EXAM: CHEST  2 VIEW  COMPARISON:  12/22/2013.  FINDINGS: Mediastinum and hilar structures are normal. Mild cardiomegaly with normal pulmonary vascularity. Prior CABG. Lungs are clear. No pleural effusion or pneumothorax. Stable right pleural thickening. No acute bony abnormality .  IMPRESSION: 1. Prior CABG.  Stable cardiomegaly, no CHF. 2. No acute pulmonary disease.  No acute bony  abnormality.   Electronically Signed   By: Marcello Moores  Register   On: 01/23/2014 12:12   Ct Head Wo Contrast  01/23/2014   CLINICAL DATA:  Dizziness, near-syncope. Slurred speech. Full sensation in head.  EXAM: CT HEAD WITHOUT CONTRAST  TECHNIQUE: Contiguous axial images were obtained from the base of the skull through the vertex without intravenous contrast.  COMPARISON:  MRI 10/29/2013  FINDINGS: There is atrophy and chronic small vessel disease changes. No acute intracranial abnormality. Specifically, no hemorrhage, hydrocephalus, mass lesion, acute infarction, or significant intracranial injury. No acute calvarial abnormality.  Mucosal thickening in the right frontal sinus. No air-fluid levels. Mastoid air cells are clear.  IMPRESSION: No acute intracranial abnormality.  Atrophy, chronic microvascular disease.  Chronic right frontal sinusitis.   Electronically Signed   By: Rolm Baptise M.D.   On: 01/23/2014 12:06   Mr Brain Wo Contrast  01/23/2014   CLINICAL DATA:  Episode of dizziness with near syncope. Slurred speech.  EXAM: MRI HEAD WITHOUT CONTRAST  TECHNIQUE: Multiplanar, multiecho pulse sequences of the brain and surrounding structures were obtained without intravenous contrast.  COMPARISON:  CT head from the same day.  MRI brain 10/29/2013.  FINDINGS: The diffusion-weighted images demonstrate no evidence for acute or subacute infarction. The study is mildly degraded by patient motion. Mild generalized atrophy is evident. Minimal periventricular white matter disease is within normal limits. Flow is present in the major intracranial arteries. The patient is status post bilateral lens replacements. The paranasal sinuses and mastoid air cells are clear.  IMPRESSION: 1. No acute intracranial abnormality or significant interval change. 2. Mild generalized atrophy is stable.   Electronically Signed   By: Lawrence Santiago M.D.   On: 01/23/2014 15:00    Scheduled Meds: . aspirin EC  325 mg Oral Daily  .  donepezil  10 mg Oral QHS  . enoxaparin (LOVENOX) injection  40 mg Subcutaneous Q24H  . escitalopram  20 mg Oral Daily  . ferrous gluconate  324 mg Oral BID WC  . fludrocortisone  0.1 mg Oral Daily  . insulin aspart  0-9 Units Subcutaneous TID WC  . levothyroxine  112 mcg Oral QAC breakfast  . midodrine  10 mg Oral BID WC  . pantoprazole  40 mg Oral q morning - 10a  . polyethylene glycol  17 g Oral QHS  . sodium chloride  3 mL Intravenous Q12H   Continuous Infusions: . sodium chloride      Principal Problem:   Dizziness Active Problems:   Obstructive sleep apnea   Orthostatic hypotension   Dementia without behavioral disturbance   Prolonged Q-T interval on ECG   DM type 2 (diabetes mellitus, type 2)    Time spent: 25 min    Kelvin Cellar  [facility] Pager [phone] 7PM-7AM, please contact night-coverage at [email or web address], password [facility] 01/24/2014, 3:54 PM  LOS: 1 day

## 2014-01-24 NOTE — Progress Notes (Signed)
UR completed 

## 2014-01-25 DIAGNOSIS — Z951 Presence of aortocoronary bypass graft: Secondary | ICD-10-CM

## 2014-01-25 LAB — BASIC METABOLIC PANEL
Anion gap: 12 (ref 5–15)
BUN: 17 mg/dL (ref 6–23)
CALCIUM: 9.5 mg/dL (ref 8.4–10.5)
CO2: 23 meq/L (ref 19–32)
CREATININE: 1.22 mg/dL (ref 0.50–1.35)
Chloride: 100 mEq/L (ref 96–112)
GFR calc Af Amer: 64 mL/min — ABNORMAL LOW (ref >90)
GFR calc non Af Amer: 55 mL/min — ABNORMAL LOW (ref >90)
Glucose, Bld: 106 mg/dL — ABNORMAL HIGH (ref 70–99)
Potassium: 4.2 mEq/L (ref 3.7–5.3)
Sodium: 135 mEq/L — ABNORMAL LOW (ref 137–147)

## 2014-01-25 LAB — GLUCOSE, CAPILLARY
Glucose-Capillary: 115 mg/dL — ABNORMAL HIGH (ref 70–99)
Glucose-Capillary: 101 mg/dL — ABNORMAL HIGH (ref 70–99)

## 2014-01-25 LAB — CBC
HCT: 35.6 % — ABNORMAL LOW (ref 39.0–52.0)
Hemoglobin: 11.3 g/dL — ABNORMAL LOW (ref 13.0–17.0)
MCH: 28.9 pg (ref 26.0–34.0)
MCHC: 31.7 g/dL (ref 30.0–36.0)
MCV: 91 fL (ref 78.0–100.0)
Platelets: 301 10*3/uL (ref 150–400)
RBC: 3.91 MIL/uL — ABNORMAL LOW (ref 4.22–5.81)
RDW: 14.8 % (ref 11.5–15.5)
WBC: 6.5 10*3/uL (ref 4.0–10.5)

## 2014-01-25 MED ORDER — FLUDROCORTISONE ACETATE 0.1 MG PO TABS: 0.1000 mg | ORAL_TABLET | Freq: Every day | ORAL | Status: DC

## 2014-01-25 MED ORDER — MIDODRINE HCL 10 MG PO TABS: 10.0000 mg | ORAL_TABLET | Freq: Two times a day (BID) | ORAL | Status: DC

## 2014-01-25 NOTE — Progress Notes (Signed)
Patient [name] [name] home via car with son.  Home health was set up prior to [facility].  DC'd instructions and prescriptios given and understood by patient.  Vital signs and assessments were stable prior to discharge.

## 2014-01-25 NOTE — Care Management Note (Signed)
    Page 1 of 2   01/25/2014     12:41:55 PM CARE MANAGEMENT NOTE 01/25/2014  Patient:  [name] [name],[name] [name]   Account Number:  [id number]  Date Initiated:  01/25/2014  Documentation initiated by:  [facility]  Subjective/Objective Assessment:   adm:  Dizziness     Action/Plan:   discharge planing   Anticipated DC Date:  01/25/2014   Anticipated DC Plan:  [place]  CM consult      [facility] Choice  Resumption Of Svcs/PTA Provider   Choice offered to / List presented to:  [name] Patient   DME arranged  Vassie Moselle      DME agency  [facility] arranged  HH-1 RN  [place].   Status of service:  Completed, signed off Medicare Important Message given?   (If response is "NO", the following Medicare IM given date fields will be blank) Date Medicare IM given:   Medicare IM given by:   Date Additional Medicare IM given:   Additional Medicare IM given by:    Discharge Disposition:  [place]  Per UR Regulation:    If discussed at Long Length of Stay Meetings, dates discussed:    Comments:  01/25/14 12:00 CM met with pt who is active with [facility] for [facility] services,  [facility] has been added.  [facility] rep, Miranda aware. CM called DME rep, Jeneen Rinks to deliver rolling walker to room prior to discharge.  Mariane Masters, BSN, CM [phone].

## 2014-01-25 NOTE — Progress Notes (Signed)
Utilization Review Completed.   Bethenny Losee, RN, BSN Nurse Case Manager  

## 2014-01-25 NOTE — Discharge Summary (Signed)
Physician Discharge Summary  [name] [name] ZOX:[id number] DOB: 1933/05/02 DOA: 01/23/2014  PCP: Purvis Kilts, MD  Admit date: 01/23/2014 Discharge date: 01/25/2014  Time spent: 35 minutes  Recommendations for Outpatient Follow-up:  1. Please follow-up on orthostatics, patient had orthostatic hypotension having his Midrin increased to 10 mg twice a day with the patient of fludrocortisone 0.1 mg by mouth daily during this hospitalization 2. Prior to discharge home health services were resumed 3. DME rolling walker ordered  Discharge Diagnoses:  Principal Problem:   Orthostatic hypotension Active Problems:   Dizziness   Obstructive sleep apnea   Dementia without behavioral disturbance   Prolonged Q-T interval on ECG   DM type 2 (diabetes mellitus, type 2)   Discharge Condition: Stable  Diet recommendation: Heart healthy/carb modified  Filed Weights   01/23/14 1700  Weight: 107.9 kg (237 lb 14 oz)    History of present illness:  [name] [name] is a 80 y.o. male  Past medical history of obstructive sleep apnea, mild dementia and status post CABG approximately 6 weeks ago who is been in a skilled nursing facility until a week ago when he was discharged home. Patient also has a history of orthostatic hypotension on midodrine. Family had noted that he continues to have issues when standing for prolonged, special physical therapy where he gets lightheaded and has to sit back down. Today, the patient himself felt so dizzy he felt like he was going to fall back in stayed in the bathroom on the commode. He did not pass out. Family was concerned because they also feel that he had some slurred speech. He was brought into the emergency room for further evaluation. In the emergency room, labs were unremarkable, CT scan of head and MRI were unrevealing patient was brought in for further evaluation.   Hospital Course:  The patient is a pleasant 80 year old woman with a past medical  history of coronary artery disease, status post coronary artery bypass grafting admitted to medicine service on 01/23/2014 presenting with dizziness. He has a history of orthostatic hypotension and had been on Midrin 5 mg by mouth twice a day. Family reporting that his positional dizziness that limited his ability to participate in physical therapy. He denied recent falls or loss of consciousness. During this hospitalization he was orthostatic as his Midrin was increased from 5-10 mg by mouth twice a day. Additionally fludrocortisone 0.1 mg by mouth daily was added. I suspect that orthostatic hypotension could be secondary to diabetes mellitus. Patient was ambulating down the hallway with walker. Physical therapy recommended home health PT. Other issues addressed are that hospitalization include QT prolongation with EKG showed a QTc of 471 for which a Vesicare was discontinued. He was discharged to his home in stable condition on 01/25/2014.     Consultations:  Physical therapy  Discharge Exam: Filed Vitals:   01/25/14 0605  BP: 144/71  Pulse: 71  Temp: 98.5 F (36.9 C)  Resp: 18   General: Patient is in acute distress, reports feeling dizzy when he stands up  Cardiovascular: Regular rate and rhythm normal S1-S2  Respiratory: normal respiratory effort, lungs are clear to auscultation  Abdomen: soft nontender nondistended  Musculoskeletal: no edema    Discharge Instructions You were cared for by a hospitalist during your hospital stay. If you have any questions about your discharge medications or the care you received while you were in the hospital after you are discharged, you can call the unit and asked to speak with  the hospitalist on call if the hospitalist that took care of you is not available. Once you are discharged, your primary care physician will handle any further medical issues. Please note that NO REFILLS for any discharge medications will be authorized once you are discharged,  as it is imperative that you return to your primary care physician (or establish a relationship with a primary care physician if you do not have one) for your aftercare needs so that they can reassess your need for medications and monitor your lab values.  Discharge Instructions   Call MD for:  difficulty breathing, headache or visual disturbances    Complete by:  As directed      Call MD for:  extreme fatigue    Complete by:  As directed      Call MD for:  hives    Complete by:  As directed      Call MD for:  persistant dizziness or light-headedness    Complete by:  As directed      Call MD for:  persistant nausea and vomiting    Complete by:  As directed      Call MD for:  redness, tenderness, or signs of infection (pain, swelling, redness, odor or green/yellow discharge around incision site)    Complete by:  As directed      Call MD for:  severe uncontrolled pain    Complete by:  As directed      Call MD for:  temperature >100.4    Complete by:  As directed      Diet - low sodium heart healthy    Complete by:  As directed      Increase activity slowly    Complete by:  As directed           Current Discharge Medication List    START taking these medications   Details  fludrocortisone (FLORINEF) 0.1 MG tablet Take 1 tablet (0.1 mg total) by mouth daily. Qty: 30 tablet, Refills: 1      CONTINUE these medications which have CHANGED   Details  midodrine (PROAMATINE) 10 MG tablet Take 1 tablet (10 mg total) by mouth 2 (two) times daily with breakfast and lunch. Qty: 60 tablet, Refills: 1      CONTINUE these medications which have NOT CHANGED   Details  aspirin EC 325 MG EC tablet Take 1 tablet (325 mg total) by mouth daily. Qty: 30 tablet, Refills: 0    bisacodyl (DULCOLAX) 5 MG EC tablet Take 5 mg by mouth daily as needed for moderate constipation.    donepezil (ARICEPT) 10 MG tablet Take 10 mg by mouth at bedtime.    escitalopram (LEXAPRO) 20 MG tablet Take 20 mg by  mouth daily.     ferrous gluconate (FERGON) 324 MG tablet Take 1 tablet (324 mg total) by mouth 2 (two) times daily with a meal. For one month then stop. Refills: 3    insulin aspart (NOVOLOG) 100 UNIT/ML injection Inject 0-24 Units into the skin 4 (four) times daily -  before meals and at bedtime. CBG  < 120 (dose in units): 0 units     CBG 120 - 160 (dose in units): 2        CBG 251 - 300 (dose in units): 12       CBG 161-200 (dose in units): 4       CBG 301 - 350 (dose in units) 16  CBG 201 - 250 (dose in units): 8  CBG 351 - 450 (dose in units): 20    CBG > 450 24 units and obtain STAT glucose and notify MD Qty: 10 mL, Refills: 11    levothyroxine (SYNTHROID, LEVOTHROID) 112 MCG tablet Take 112 mcg by mouth daily before breakfast.    pantoprazole (PROTONIX) 40 MG tablet Take 40 mg by mouth every morning. ACID REFLUX/ GERD    polyethylene glycol (MIRALAX / GLYCOLAX) packet Take 17 g by mouth at bedtime.      STOP taking these medications     solifenacin (VESICARE) 10 MG tablet        Allergies  Allergen Reactions  . Escitalopram Other (See Comments)    Reaction is unknown: states sinus drainage. ( Patient is presently taking this medication)  . Statins Other (See Comments)    unknown   Follow-up Information   Follow up with Purvis Kilts, MD In 1 week.   Specialty:  Family Medicine   Contact information:   [address] [place] [address] [phone]        The results of significant diagnostics from this hospitalization (including imaging, microbiology, ancillary and laboratory) are listed below for reference.    Significant Diagnostic Studies: Dg Chest 2 View  01/23/2014   CLINICAL DATA:  Syncope.  Fell 2 days ago.  Recent bypass surgery .  EXAM: CHEST  2 VIEW  COMPARISON:  12/22/2013.  FINDINGS: Mediastinum and hilar structures are normal. Mild cardiomegaly with normal pulmonary vascularity. Prior CABG. Lungs are clear. No pleural effusion  or pneumothorax. Stable right pleural thickening. No acute bony abnormality .  IMPRESSION: 1. Prior CABG.  Stable cardiomegaly, no CHF. 2. No acute pulmonary disease.  No acute bony abnormality.   Electronically Signed   By: Marcello Moores  Register   On: 01/23/2014 12:12   Ct Head Wo Contrast  01/23/2014   CLINICAL DATA:  Dizziness, near-syncope. Slurred speech. Full sensation in head.  EXAM: CT HEAD WITHOUT CONTRAST  TECHNIQUE: Contiguous axial images were obtained from the base of the skull through the vertex without intravenous contrast.  COMPARISON:  MRI 10/29/2013  FINDINGS: There is atrophy and chronic small vessel disease changes. No acute intracranial abnormality. Specifically, no hemorrhage, hydrocephalus, mass lesion, acute infarction, or significant intracranial injury. No acute calvarial abnormality.  Mucosal thickening in the right frontal sinus. No air-fluid levels. Mastoid air cells are clear.  IMPRESSION: No acute intracranial abnormality.  Atrophy, chronic microvascular disease.  Chronic right frontal sinusitis.   Electronically Signed   By: Rolm Baptise M.D.   On: 01/23/2014 12:06   Mr Brain Wo Contrast  01/23/2014   CLINICAL DATA:  Episode of dizziness with near syncope. Slurred speech.  EXAM: MRI HEAD WITHOUT CONTRAST  TECHNIQUE: Multiplanar, multiecho pulse sequences of the brain and surrounding structures were obtained without intravenous contrast.  COMPARISON:  CT head from the same day.  MRI brain 10/29/2013.  FINDINGS: The diffusion-weighted images demonstrate no evidence for acute or subacute infarction. The study is mildly degraded by patient motion. Mild generalized atrophy is evident. Minimal periventricular white matter disease is within normal limits. Flow is present in the major intracranial arteries. The patient is status post bilateral lens replacements. The paranasal sinuses and mastoid air cells are clear.  IMPRESSION: 1. No acute intracranial abnormality or significant interval  change. 2. Mild generalized atrophy is stable.   Electronically Signed   By: Lawrence Santiago M.D.   On: 01/23/2014 15:00    Microbiology: Recent Results (from the past 240 hour(s))  URINE CULTURE     Status: None   Collection Time    01/23/14  1:25 PM      Result Value Ref Range Status   Specimen Description URINE, CLEAN CATCH   Final   Special Requests NONE   Final   Culture  Setup Time     Final   Value: 01/23/2014 20:55     Performed at [facility]     Final   Value: 5,000 COLONIES/ML     Performed at [facility]   Culture     Final   Value: INSIGNIFICANT GROWTH     Performed at [facility]   Report Status 01/24/2014 FINAL   Final     Labs: Basic Metabolic Panel:  Recent Labs Lab 01/23/14 1135 01/24/14 0428 01/25/14 0504  NA 136* 137 135*  K 4.3 4.9 4.2  CL 99 102 100  CO2 24 25 23   GLUCOSE 112* 107* 106*  BUN 19 18 17   CREATININE 1.28 1.28 1.22  CALCIUM 9.9 9.6 9.5   Liver Function Tests: No results found for this basename: AST, ALT, ALKPHOS, BILITOT, PROT, ALBUMIN,  in the last 168 hours No results found for this basename: LIPASE, AMYLASE,  in the last 168 hours No results found for this basename: AMMONIA,  in the last 168 hours CBC:  Recent Labs Lab 01/23/14 1135 01/24/14 0428 01/25/14 0504  WBC 7.3 6.7 6.5  NEUTROABS 4.8  --   --   HGB 12.1* 11.2* 11.3*  HCT 38.0* 34.6* 35.6*  MCV 91.3 91.3 91.0  PLT 311 280 301   Cardiac Enzymes:  Recent Labs Lab 01/23/14 1135  TROPONINI <0.30   BNP: BNP (last 3 results) No results found for this basename: PROBNP,  in the last 8760 hours CBG:  Recent Labs Lab 01/24/14 0649 01/24/14 1138 01/24/14 1633 01/24/14 2109 01/25/14 0637  GLUCAP 112* 110* 118* 117* 115*       Signed:  Azoria Abbett  [facility] 01/25/2014, 9:00 AM

## 2014-01-27 ENCOUNTER — Other Ambulatory Visit: Payer: Self-pay | Admitting: Surgery

## 2014-01-27 DIAGNOSIS — G459 Transient cerebral ischemic attack, unspecified: Secondary | ICD-10-CM

## 2014-01-28 ENCOUNTER — Ambulatory Visit
Admission: RE | Admit: 2014-01-28 | Discharge: 2014-01-28 | Disposition: A | Discharge status: Home / Self Care | Payer: Medicare Other | Source: Ambulatory Visit | Attending: Surgery | Admitting: Surgery

## 2014-01-28 ENCOUNTER — Ambulatory Visit (INDEPENDENT_AMBULATORY_CARE_PROVIDER_SITE_OTHER): Payer: Self-pay | Admitting: Surgery

## 2014-01-28 ENCOUNTER — Encounter: Payer: Self-pay | Admitting: Surgery

## 2014-01-28 VITALS — BP 110/74 | HR 68 | Ht 70.0 in | Wt 237.0 lb

## 2014-01-28 DIAGNOSIS — G459 Transient cerebral ischemic attack, unspecified: Secondary | ICD-10-CM

## 2014-01-28 DIAGNOSIS — Z951 Presence of aortocoronary bypass graft: Secondary | ICD-10-CM

## 2014-01-29 ENCOUNTER — Encounter: Payer: Self-pay | Admitting: Surgery

## 2014-01-29 NOTE — Progress Notes (Signed)
HPI:  Patient returns for routine postoperative follow-up having undergone CABG x 4 on 12/18/2013. The patient's early postoperative recovery while in the hospital was notable for a slow postop course due to deconditioning and some orthostatic hypotension. He had been on Midodrine preop for this. He was discharged to [facility] for rehab.  Since hospital discharge the patient's family reports that he has had persistent problems with orthostatic hypotension since going home and requires constant supervision and help with ambulation. It sounds like this dizziness or balance problem sometimes occurs when he first gets up but then will also occur while he is up walking and feeling fine. They have noted that he also has difficulty talking and moving when this occurs. He was recently admitted to [facility] for this and started on Florinef by the hospitalist in addition to the Midodrine. A brain MRI was unchanged with no acute abnormality. It is difficult to get much of a history from him due to his dementia. His wife and daughter are with him today.   Current Outpatient Prescriptions  Medication Sig Dispense Refill  . aspirin EC 325 MG EC tablet Take 1 tablet (325 mg total) by mouth daily.  30 tablet  0  . bisacodyl (DULCOLAX) 5 MG EC tablet Take 5 mg by mouth daily as needed for moderate constipation.      [name] donepezil (ARICEPT) 10 MG tablet Take 10 mg by mouth at bedtime.      [name] escitalopram (LEXAPRO) 20 MG tablet Take 20 mg by mouth daily.       . ferrous gluconate (FERGON) 324 MG tablet Take 1 tablet (324 mg total) by mouth 2 (two) times daily with a meal. For one month then stop.    3  . fludrocortisone (FLORINEF) 0.1 MG tablet Take 1 tablet (0.1 mg total) by mouth daily.  30 tablet  1  . levothyroxine (SYNTHROID, LEVOTHROID) 112 MCG tablet Take 112 mcg by mouth daily before breakfast.      . midodrine (PROAMATINE) 10 MG tablet Take 1 tablet (10 mg total) by mouth 2 (two) times daily with breakfast  and lunch.  60 tablet  1  . pantoprazole (PROTONIX) 40 MG tablet Take 40 mg by mouth every morning. ACID REFLUX/ GERD      . polyethylene glycol (MIRALAX / GLYCOLAX) packet Take 17 g by mouth at bedtime.       No current facility-administered medications for this visit.    Physical Exam: BP 110/74  Pulse 68  Ht 5\' 10"  (1.778 m)  Wt 237 lb (107.502 kg)  BMI 34.01 kg/m2  SpO2 96% He looks well but does not have much to say. Lung exam is clear. Cardiac exam shows a regular rate and rhythm with normal heart sounds. Chest incision is healing well and sternum is stable. There is a small area in the middle of the incision that is open but dry. It looks like where the suture knot eroded through the incision. There is no sign of infection. The leg incisions are healing well and there is no peripheral edema.   Diagnostic Tests:  CLINICAL DATA: TIA. Recent CABG. Initial encounter  EXAM:  CHEST 2 VIEW  COMPARISON: 01/23/2014  FINDINGS:  No cardiomegaly. Moderate aortic tortuosity that is stable. Status  post CABG.  There is extrapleural thickening on the right which is stable from  previous. This was not present on preoperative chest radiograph and  is likely postoperative scarring or trace loculated fluid. No  pneumothorax, edema, or pneumonia.  IMPRESSION:  Stable exam. No evidence of acute cardiopulmonary disease.  Electronically Signed  By: Jorje Guild M.D.  On: 01/28/2014 13:00    Impression:  I think he is doing well from a surgical standpoint. I don't know what is causing his dizziness and balance problems. He has had orthostatic hypotension before and his daughter has noted some at home when she checks his pressure. He may require further titration of his Midodrine and Florinef. He probably should also have a rhythm monitor placed to be sure his is not having bradycardia or pauses that may contribute to this. He is going back to see Dr. Gwenlyn Found and his daughter will discuss  that with him. He has been on Aricept which can cause bradycardia and AV block as well as dizziness and syncope in some patients.  Plan:  He will follow up with Dr. Gwenlyn Found and Dr. Hilma Favors and will return to see me if he develops any problems with his incisions.

## 2014-01-30 ENCOUNTER — Ambulatory Visit: Payer: Medicare Other | Admitting: Cardiovascular Disease

## 2014-02-04 ENCOUNTER — Telehealth: Payer: Self-pay | Admitting: Cardiovascular Disease

## 2014-02-06 NOTE — Telephone Encounter (Signed)
Closed encounter °

## 2014-02-19 ENCOUNTER — Telehealth: Payer: Self-pay | Admitting: Cardiovascular Disease

## 2014-02-19 NOTE — Telephone Encounter (Signed)
Message routed to [place], [place] & Dr. Gwenlyn Found to review & advise

## 2014-02-19 NOTE — Telephone Encounter (Signed)
[name] [name] calling in stating that the pt has a bladder tumor and needs to have surgery to remove it. She needs cardiac clearance for this pt before scheduling the surgery. She says that she will be faxing over some notes. Please call  Thanks

## 2014-02-19 NOTE — Telephone Encounter (Signed)
Received records from [facility] ( regarding surgical clearance.)  Records for patient appointment with Dr Gwenlyn Found on 02/24/14.  Records given to [facility] (medical records) for Dr Kennon Holter schedule on 02/24/14.   lp

## 2014-02-20 NOTE — Telephone Encounter (Signed)
Cleared for his bladder surgery at low cardiovascular risk.

## 2014-02-20 NOTE — Telephone Encounter (Signed)
Please review

## 2014-02-24 ENCOUNTER — Other Ambulatory Visit: Payer: Self-pay | Admitting: *Deleted

## 2014-02-24 ENCOUNTER — Ambulatory Visit (INDEPENDENT_AMBULATORY_CARE_PROVIDER_SITE_OTHER): Payer: Medicare Other | Admitting: Cardiovascular Disease

## 2014-02-24 ENCOUNTER — Encounter: Payer: Self-pay | Admitting: Cardiovascular Disease

## 2014-02-24 VITALS — BP 139/83 | HR 62 | Ht 70.5 in | Wt 232.0 lb

## 2014-02-24 DIAGNOSIS — I951 Orthostatic hypotension: Secondary | ICD-10-CM

## 2014-02-24 DIAGNOSIS — I2510 Atherosclerotic heart disease of native coronary artery without angina pectoris: Secondary | ICD-10-CM

## 2014-02-24 DIAGNOSIS — I2119 ST elevation (STEMI) myocardial infarction involving other coronary artery of inferior wall: Secondary | ICD-10-CM

## 2014-02-24 DIAGNOSIS — R55 Syncope and collapse: Secondary | ICD-10-CM

## 2014-02-24 DIAGNOSIS — E785 Hyperlipidemia, unspecified: Secondary | ICD-10-CM

## 2014-02-24 MED ORDER — FLUDROCORTISONE ACETATE 0.1 MG PO TABS: 0.2000 mg | ORAL_TABLET | Freq: Every day | ORAL | Status: DC

## 2014-02-24 NOTE — Assessment & Plan Note (Signed)
Patient recently had a ST segment elevation myocardial infarction and underwent urgent cardiac catheterization by Dr. Glenetta Hew on 12/17/13. His EF was in the 45-50% range. The patient also underwent coronary artery bypass grafting by Dr. Arvid Right .

## 2014-02-24 NOTE — Assessment & Plan Note (Signed)
History of hyperlipidemia currently not on a statin drug. 

## 2014-02-24 NOTE — Telephone Encounter (Signed)
Patient notified at office visit 11/17

## 2014-02-24 NOTE — Patient Instructions (Signed)
  We will see you back in follow up in 4-6 weeks with Dr Gwenlyn Found.  Dr Gwenlyn Found has ordered: Increase Florinef to 0.2mg  daily (2 tablets of 0.1mg )   Event monitor (30 days). Event monitors are medical devices that record the heart's electrical activity. Doctors most often [place] these monitors to diagnose arrhythmias. Arrhythmias are problems with the speed or rhythm of the heartbeat. The monitor is a small, portable device. You can wear one while you do your normal daily activities. This is usually used to diagnose what is causing palpitations/syncope (passing out).

## 2014-02-24 NOTE — Telephone Encounter (Signed)
Patient's medication dosage was increased and his number of tablets needed to be increased as well. Increased it to 60 tablets, with 11 refills.

## 2014-02-24 NOTE — Progress Notes (Signed)
02/24/2014 [name] [name]   06-16-1933  [id number]  Primary Physician Purvis Kilts, MD Primary Cardiologist: Lorretta Harp MD Renae Gloss   HPI:  The patient is a 80 year old, moderately overweight, widowed Caucasian male, father of 3 who I last saw a year ago. He has a history of hyperlipidemia intolerant to statin drugs and obstructive sleep apnea on CPAP occasionally. He has been asymptomatic since I last saw him. His last Myoview performed 2 years ago was nonischemic. For the last saw him he denies chest pain but does get some dyspnea. His major complaint is of dizziness which sounds orthostatic. He has seen an ENT doctor who has ruled out in her ear problems. He had an ST segment elevation myocardial infarction treated with intervention of his right coronary artery by Dr. Ellyn Hack on 12/17/13. Essentially underwent coronary artery bypass grafting the following day by Dr. Arvid Right. His major issues have been with symptomatic with hypotension. He was started on Midrin in the past without improvement. He was admitted to [facility] a month later with similar symptoms of orthostatic hypotension and Florinef was started. Midodrin was uptitrated. He continues to be symptomatic..  Current Outpatient Prescriptions  Medication Sig Dispense Refill  . aspirin EC 325 MG EC tablet Take 1 tablet (325 mg total) by mouth daily. 30 tablet 0  . bisacodyl (DULCOLAX) 5 MG EC tablet Take 5 mg by mouth daily as needed for moderate constipation.    [name] donepezil (ARICEPT) 10 MG tablet Take 10 mg by mouth at bedtime.    [name] escitalopram (LEXAPRO) 20 MG tablet Take 20 mg by mouth daily.     . ferrous gluconate (FERGON) 324 MG tablet Take 1 tablet (324 mg total) by mouth 2 (two) times daily with a meal. For one month then stop.  3  . fludrocortisone (FLORINEF) 0.1 MG tablet Take 2 tablets (0.2 mg total) by mouth daily. 30 tablet 11  . levothyroxine (SYNTHROID, LEVOTHROID) 112 MCG tablet Take  112 mcg by mouth daily before breakfast.    . midodrine (PROAMATINE) 10 MG tablet Take 1 tablet (10 mg total) by mouth 2 (two) times daily with breakfast and lunch. 60 tablet 1  . pantoprazole (PROTONIX) 40 MG tablet Take 40 mg by mouth every morning. ACID REFLUX/ GERD    . polyethylene glycol (MIRALAX / GLYCOLAX) packet Take 17 g by mouth at bedtime.     No current facility-administered medications for this visit.    Allergies  Allergen Reactions  . Escitalopram Other (See Comments)    Reaction is unknown: states sinus drainage. ( Patient is presently taking this medication)  . Statins Other (See Comments)    unknown    History   Social History  . Marital Status: Divorced    Spouse Name: N/A    Number of Children: 3  . Years of Education: HS   Occupational History  .     Social History Main Topics  . Smoking status: Former Smoker -- 1.00 packs/day for 4 years    Types: Cigarettes  . Smokeless tobacco: Former Systems developer    Types: [place] date: 01/03/1960  . Alcohol Use: No  . Drug Use: No  . Sexual Activity: Yes    Birth Control/ Protection: None   Other Topics Concern  . Not on file   Social History Narrative   Patient is single and lives alone.   Patient is retired.   Patient has a high school  education.   Patient is right-handed.   Patient drinks one cup of coffee and one cup of soda or tea daily.   Patient has three adult children.     Review of Systems: General: negative for chills, fever, night sweats or weight changes.  Cardiovascular: negative for chest pain, dyspnea on exertion, edema, orthopnea, palpitations, paroxysmal nocturnal dyspnea or shortness of breath Dermatological: negative for rash Respiratory: negative for cough or wheezing Urologic: negative for hematuria Abdominal: negative for nausea, vomiting, diarrhea, bright red blood per rectum, melena, or hematemesis Neurologic: negative for visual changes, syncope, or dizziness All other systems  reviewed and are otherwise negative except as noted above.    Blood pressure 139/83, pulse 62, height 5' 10.5" (1.791 m), weight 232 lb (105.235 kg).  General appearance: alert and no distress Neck: no adenopathy, no carotid bruit, no JVD, supple, symmetrical, trachea midline and thyroid not enlarged, symmetric, no tenderness/mass/nodules Lungs: clear to auscultation bilaterally Heart: regular rate and rhythm, S1, S2 normal, no murmur, click, rub or gallop Extremities: extremities normal, atraumatic, no cyanosis or edema    EKG Normal sinus rhythm at 62 without ST or T-wave changes. I personally reviewed the EKG  ASSESSMENT AND PLAN:   Orthostatic hypotension The patient has several year history of symptomatic orthostatic hypotension. This become more noticeable over the last several months since his open-heart surgery. He'll was seen in the hospital by Dr. [place] Pink on and had Midodrin and Florinef added. His blood pressure today in the office dropped from 332-95 systolic upon standing from a lying position and he became visibly symptomatic. I'm going to increase his Florinef to 0.2 mg, recommend compression stockings and abdominal binder as well as a 30 day event monitor. I will see him back after that for further evaluation.  ST elevation myocardial infarction (STEMI) of inferior wall, initial episode of care Patient recently had a ST segment elevation myocardial infarction and underwent urgent cardiac catheterization by Dr. Glenetta Hew on 12/17/13. His EF was in the 45-50% range. The patient also underwent coronary artery bypass grafting by Dr. Arvid Right .  Hyperlipidemia History of hyperlipidemia currently not on a statin drug.      Lorretta Harp MD FACP,FACC,FAHA, [facility] 02/24/2014 4:58 PM Lorretta Harp MD FACP,FACC,FAHA, [facility]

## 2014-02-24 NOTE — Assessment & Plan Note (Signed)
The patient has several year history of symptomatic orthostatic hypotension. This become more noticeable over the last several months since his open-heart surgery. He'll was seen in the hospital by Dr. [place] Pink on and had Midodrin and Florinef added. His blood pressure today in the office dropped from 111-73 systolic upon standing from a lying position and he became visibly symptomatic. I'm going to increase his Florinef to 0.2 mg, recommend compression stockings and abdominal binder as well as a 30 day event monitor. I will see him back after that for further evaluation.

## 2014-02-25 NOTE — Telephone Encounter (Signed)
Clearance letter faxed to [facility]

## 2014-02-28 ENCOUNTER — Telehealth: Payer: Self-pay | Admitting: Internal Medicine

## 2014-02-28 NOTE — Telephone Encounter (Signed)
Spoke to cardionet re: this pt. He had an episode of presyncope in the setting of getting up from the commode. Rhythm eval showed NSR for the entire time, including 81min before and after. He is accompanied by his son. Operator offered ED evaluation which pt declined.  Raliegh Ip, MD MPH

## 2014-03-17 ENCOUNTER — Encounter: Payer: Self-pay | Admitting: Cardiovascular Disease

## 2014-03-17 ENCOUNTER — Telehealth: Payer: Self-pay | Admitting: Cardiovascular Disease

## 2014-03-17 NOTE — Telephone Encounter (Signed)
Pt wife called in stating that since Dr. Gwenlyn Found increased his dosage on the fludrocortisone he has been experiencing headaches in the morning , his BP has been elevated a bit and his blood sugar is averaging around 115-130. She would like to know if this is something she should be concerned about.Please call  Thanks

## 2014-03-17 NOTE — Telephone Encounter (Signed)
Spoke with pt dtr, explained the changes in blood sugar is related to the increase in the florinef. They will give the medical doctor a call to make them aware, and see if there is anything they want to do about his sugars. She reports his bp running 140's to 130's/96 to 86. The headaches are not bad enough to take anything but he has one everyday. Will make dr berry aware.

## 2014-03-17 NOTE — Telephone Encounter (Signed)
Can try backing Florinef back to original dose

## 2014-03-18 ENCOUNTER — Other Ambulatory Visit: Payer: Self-pay | Admitting: Urology

## 2014-03-18 NOTE — Telephone Encounter (Signed)
Spoke to [place], pt's daughter. She stated that the headaches are first thing in the morning when the patient wakes up and his BP is increased. She stated that he takes his Florinef after breakfast. After eating and taking meds pt no longer complains of a headache. The med was increased to help with low BP and blacking out spells. She said these have gotten better, but his BP and blood sugar have increased. So is the Florinef possibly effecting this? Please advise.

## 2014-03-19 ENCOUNTER — Encounter (HOSPITAL_COMMUNITY): Payer: Self-pay | Admitting: Cardiology

## 2014-03-23 ENCOUNTER — Other Ambulatory Visit: Payer: Self-pay | Admitting: Cardiovascular Disease

## 2014-03-23 MED ORDER — MIDODRINE HCL 10 MG PO TABS: 10.0000 mg | ORAL_TABLET | Freq: Two times a day (BID) | ORAL | Status: DC

## 2014-03-23 NOTE — Telephone Encounter (Signed)
Rx was sent to pharmacy electronically. 

## 2014-03-23 NOTE — Telephone Encounter (Signed)
Tammy was calling in stating that a new prescription for Midodrin is needed for the pt. Please call  Thanks

## 2014-03-27 ENCOUNTER — Encounter (HOSPITAL_BASED_OUTPATIENT_CLINIC_OR_DEPARTMENT_OTHER): Payer: Self-pay | Admitting: *Deleted

## 2014-03-30 ENCOUNTER — Encounter (HOSPITAL_BASED_OUTPATIENT_CLINIC_OR_DEPARTMENT_OTHER): Payer: Self-pay | Admitting: *Deleted

## 2014-03-30 NOTE — Progress Notes (Addendum)
SPOKE W/  DAUGHTER-N-LAW, [place].  PT HAS MILD DEMENTIA, SUGGEST SON AND DAUGHTER-N-LAW GO BACK IN PRE-OP.  NPO AFTER MN. ARRIVE AT 0900. NEEDS ISTAT .  CURRENT CXR AND EKG IN CHART AND EPIC.  WILL TAKE AM MEDS DOS W/ SIPS OF WATER.  REVIEWED CHART W/ DR GERMEROTH MDA, WHOM WILL BE MDA DOS,  STATE OK TO PROCEED.

## 2014-04-06 ENCOUNTER — Other Ambulatory Visit (INDEPENDENT_AMBULATORY_CARE_PROVIDER_SITE_OTHER): Payer: Self-pay | Admitting: Internal Medicine

## 2014-04-07 ENCOUNTER — Encounter (HOSPITAL_BASED_OUTPATIENT_CLINIC_OR_DEPARTMENT_OTHER)
Admission: RE | Disposition: A | Discharge status: Home / Self Care | Payer: Self-pay | Source: Ambulatory Visit | Attending: Urology

## 2014-04-07 ENCOUNTER — Ambulatory Visit (HOSPITAL_BASED_OUTPATIENT_CLINIC_OR_DEPARTMENT_OTHER): Payer: Medicare Other | Admitting: Anesthesiology

## 2014-04-07 ENCOUNTER — Encounter (HOSPITAL_BASED_OUTPATIENT_CLINIC_OR_DEPARTMENT_OTHER): Payer: Self-pay

## 2014-04-07 ENCOUNTER — Ambulatory Visit (HOSPITAL_BASED_OUTPATIENT_CLINIC_OR_DEPARTMENT_OTHER)
Admission: RE | Admit: 2014-04-07 | Discharge: 2014-04-07 | Disposition: A | Discharge status: Home / Self Care | Payer: Medicare Other | Source: Ambulatory Visit | Attending: Urology | Admitting: Urology

## 2014-04-07 DIAGNOSIS — D494 Neoplasm of unspecified behavior of bladder: Secondary | ICD-10-CM | POA: Diagnosis not present

## 2014-04-07 DIAGNOSIS — F419 Anxiety disorder, unspecified: Secondary | ICD-10-CM | POA: Diagnosis not present

## 2014-04-07 DIAGNOSIS — Z8673 Personal history of transient ischemic attack (TIA), and cerebral infarction without residual deficits: Secondary | ICD-10-CM | POA: Diagnosis not present

## 2014-04-07 DIAGNOSIS — E039 Hypothyroidism, unspecified: Secondary | ICD-10-CM | POA: Diagnosis not present

## 2014-04-07 DIAGNOSIS — D495 Neoplasm of unspecified behavior of other genitourinary organs: Secondary | ICD-10-CM | POA: Insufficient documentation

## 2014-04-07 DIAGNOSIS — F0390 Unspecified dementia without behavioral disturbance: Secondary | ICD-10-CM | POA: Diagnosis not present

## 2014-04-07 DIAGNOSIS — K219 Gastro-esophageal reflux disease without esophagitis: Secondary | ICD-10-CM | POA: Diagnosis not present

## 2014-04-07 DIAGNOSIS — E119 Type 2 diabetes mellitus without complications: Secondary | ICD-10-CM | POA: Insufficient documentation

## 2014-04-07 DIAGNOSIS — I252 Old myocardial infarction: Secondary | ICD-10-CM | POA: Insufficient documentation

## 2014-04-07 DIAGNOSIS — Z87891 Personal history of nicotine dependence: Secondary | ICD-10-CM | POA: Diagnosis not present

## 2014-04-07 DIAGNOSIS — M1990 Unspecified osteoarthritis, unspecified site: Secondary | ICD-10-CM | POA: Diagnosis not present

## 2014-04-07 DIAGNOSIS — Z7982 Long term (current) use of aspirin: Secondary | ICD-10-CM | POA: Diagnosis not present

## 2014-04-07 DIAGNOSIS — F329 Major depressive disorder, single episode, unspecified: Secondary | ICD-10-CM | POA: Insufficient documentation

## 2014-04-07 HISTORY — PX: TRANSURETHRAL RESECTION OF BLADDER TUMOR: SHX2575

## 2014-04-07 HISTORY — DX: Unspecified dementia without behavioral disturbance: F03.90

## 2014-04-07 HISTORY — DX: Neoplasm of unspecified behavior of unspecified kidney: D49.519

## 2014-04-07 HISTORY — DX: Personal history of urinary calculi: Z87.442

## 2014-04-07 HISTORY — DX: Hypothyroidism, unspecified: E03.9

## 2014-04-07 HISTORY — DX: Male erectile dysfunction, unspecified: N52.9

## 2014-04-07 HISTORY — DX: Personal history of other diseases of the digestive system: Z87.19

## 2014-04-07 HISTORY — DX: Chronic kidney disease, stage 3 unspecified: N18.30

## 2014-04-07 HISTORY — DX: Other specified postprocedural states: Z98.890

## 2014-04-07 HISTORY — DX: Chronic kidney disease, stage 3 (moderate): N18.3

## 2014-04-07 HISTORY — PX: CYSTOSCOPY/RETROGRADE/URETEROSCOPY: SHX5316

## 2014-04-07 HISTORY — DX: Presence of aortocoronary bypass graft: Z95.1

## 2014-04-07 HISTORY — DX: Benign prostatic hyperplasia without lower urinary tract symptoms: N40.0

## 2014-04-07 HISTORY — DX: Type 2 diabetes mellitus without complications: E11.9

## 2014-04-07 HISTORY — DX: Personal history of malignant neoplasm of bladder: Z85.51

## 2014-04-07 HISTORY — DX: Unspecified dementia, mild, without behavioral disturbance, psychotic disturbance, mood disturbance, and anxiety: F03.A0

## 2014-04-07 HISTORY — DX: Malignant neoplasm of prostate: C61

## 2014-04-07 LAB — POCT I-STAT, CHEM 8
BUN: 23 mg/dL (ref 6–23)
Calcium, Ion: 0.89 mmol/L — ABNORMAL LOW (ref 1.13–1.30)
Chloride: 110 mEq/L (ref 96–112)
Creatinine, Ser: 1.1 mg/dL (ref 0.50–1.35)
GLUCOSE: 125 mg/dL — AB (ref 70–99)
HEMATOCRIT: 45 % (ref 39.0–52.0)
HEMOGLOBIN: 15.3 g/dL (ref 13.0–17.0)
Potassium: 4 mmol/L (ref 3.5–5.1)
Sodium: 136 mmol/L (ref 135–145)
TCO2: 21 mmol/L (ref 0–100)

## 2014-04-07 LAB — GLUCOSE, CAPILLARY: Glucose-Capillary: 123 mg/dL — ABNORMAL HIGH (ref 70–99)

## 2014-04-07 SURGERY — TURBT (TRANSURETHRAL RESECTION OF BLADDER TUMOR)
Anesthesia: General | Site: Ureter | Laterality: Right

## 2014-04-07 MED ORDER — MEPERIDINE HCL 25 MG/ML IJ SOLN
6.2500 mg | INTRAMUSCULAR | Status: DC | PRN
  Filled 2014-04-07: qty 1

## 2014-04-07 MED ORDER — FENTANYL CITRATE 0.05 MG/ML IJ SOLN
INTRAMUSCULAR | Status: DC | PRN
  Administered 2014-04-07: 50 ug via INTRAVENOUS
  Administered 2014-04-07: 25 ug via INTRAVENOUS
  Administered 2014-04-07: 50 ug via INTRAVENOUS
  Administered 2014-04-07: 25 ug via INTRAVENOUS

## 2014-04-07 MED ORDER — CEFAZOLIN SODIUM 1-5 GM-% IV SOLN
1.0000 g | INTRAVENOUS | Status: DC
  Filled 2014-04-07: qty 50

## 2014-04-07 MED ORDER — ROCURONIUM BROMIDE 100 MG/10ML IV SOLN
INTRAVENOUS | Status: DC | PRN
  Administered 2014-04-07 (×2): 10 mg via INTRAVENOUS
  Administered 2014-04-07: 40 mg via INTRAVENOUS

## 2014-04-07 MED ORDER — PROMETHAZINE HCL 25 MG/ML IJ SOLN
6.2500 mg | INTRAMUSCULAR | Status: DC | PRN
  Filled 2014-04-07: qty 1

## 2014-04-07 MED ORDER — MITOMYCIN CHEMO FOR BLADDER INSTILLATION 40 MG: 40.0000 mg | Freq: Once | INTRAVENOUS | Status: DC

## 2014-04-07 MED ORDER — KETOROLAC TROMETHAMINE 30 MG/ML IJ SOLN
INTRAMUSCULAR | Status: DC | PRN
  Administered 2014-04-07: 30 mg via INTRAVENOUS

## 2014-04-07 MED ORDER — LIDOCAINE HCL (CARDIAC) 20 MG/ML IV SOLN
INTRAVENOUS | Status: DC | PRN
  Administered 2014-04-07: 80 mg via INTRAVENOUS

## 2014-04-07 MED ORDER — MITOMYCIN CHEMO FOR BLADDER INSTILLATION 40 MG
40.0000 mg | Freq: Once | INTRAVENOUS | Status: AC
  Administered 2014-04-07: 40 mg via INTRAVESICAL
  Filled 2014-04-07: qty 40

## 2014-04-07 MED ORDER — PHENYLEPHRINE HCL 10 MG/ML IJ SOLN
INTRAMUSCULAR | Status: DC | PRN
  Administered 2014-04-07 (×2): 80 ug via INTRAVENOUS
  Administered 2014-04-07: 40 ug via INTRAVENOUS
  Administered 2014-04-07: 80 ug via INTRAVENOUS

## 2014-04-07 MED ORDER — FENTANYL CITRATE 0.05 MG/ML IJ SOLN
INTRAMUSCULAR | Status: AC
  Filled 2014-04-07: qty 4

## 2014-04-07 MED ORDER — GLYCOPYRROLATE 0.2 MG/ML IJ SOLN
INTRAMUSCULAR | Status: DC | PRN
  Administered 2014-04-07: 0.6 mg via INTRAVENOUS

## 2014-04-07 MED ORDER — DEXAMETHASONE SODIUM PHOSPHATE 4 MG/ML IJ SOLN
INTRAMUSCULAR | Status: DC | PRN
  Administered 2014-04-07: 8 mg via INTRAVENOUS

## 2014-04-07 MED ORDER — STERILE WATER FOR IRRIGATION IR SOLN
Status: DC | PRN
  Administered 2014-04-07: 500 mL

## 2014-04-07 MED ORDER — PROPOFOL 10 MG/ML IV BOLUS
INTRAVENOUS | Status: DC | PRN
  Administered 2014-04-07: 120 mg via INTRAVENOUS

## 2014-04-07 MED ORDER — ONDANSETRON HCL 4 MG/2ML IJ SOLN
INTRAMUSCULAR | Status: DC | PRN
  Administered 2014-04-07: 4 mg via INTRAVENOUS
  Administered 2014-04-07: 1 mg via INTRAVENOUS

## 2014-04-07 MED ORDER — CEFAZOLIN SODIUM-DEXTROSE 2-3 GM-% IV SOLR
INTRAVENOUS | Status: AC
  Filled 2014-04-07: qty 50

## 2014-04-07 MED ORDER — IOHEXOL 350 MG/ML SOLN
INTRAVENOUS | Status: DC | PRN
  Administered 2014-04-07: 6 mL

## 2014-04-07 MED ORDER — SODIUM CHLORIDE 0.9 % IR SOLN
Status: DC | PRN
  Administered 2014-04-07: 3000 mL
  Administered 2014-04-07: 6000 mL

## 2014-04-07 MED ORDER — HYDROMORPHONE HCL 1 MG/ML IJ SOLN
0.2500 mg | INTRAMUSCULAR | Status: DC | PRN
  Filled 2014-04-07: qty 1

## 2014-04-07 MED ORDER — CEFAZOLIN SODIUM-DEXTROSE 2-3 GM-% IV SOLR
2.0000 g | INTRAVENOUS | Status: AC
  Administered 2014-04-07: 2 g via INTRAVENOUS
  Filled 2014-04-07: qty 50

## 2014-04-07 MED ORDER — NEOSTIGMINE METHYLSULFATE 10 MG/10ML IV SOLN
INTRAVENOUS | Status: DC | PRN
  Administered 2014-04-07: 4 mg via INTRAVENOUS

## 2014-04-07 MED ORDER — ACETAMINOPHEN 10 MG/ML IV SOLN
INTRAVENOUS | Status: DC | PRN
  Administered 2014-04-07: 1000 mg via INTRAVENOUS

## 2014-04-07 MED ORDER — SODIUM CHLORIDE 0.9 % IV SOLN
INTRAVENOUS | Status: DC
  Administered 2014-04-07 (×2): via INTRAVENOUS
  Filled 2014-04-07: qty 1000

## 2014-04-07 MED ORDER — SUCCINYLCHOLINE CHLORIDE 20 MG/ML IJ SOLN
INTRAMUSCULAR | Status: DC | PRN
  Administered 2014-04-07: 100 mg via INTRAVENOUS

## 2014-04-07 MED ORDER — MIDAZOLAM HCL 2 MG/2ML IJ SOLN
INTRAMUSCULAR | Status: AC
  Filled 2014-04-07: qty 2

## 2014-04-07 SURGICAL SUPPLY — 56 items
ADAPTER CATH URET PLST 4-6FR (CATHETERS) IMPLANT
ADPR CATH URET STRL DISP 4-6FR (CATHETERS)
BAG DRAIN URO-CYSTO SKYTR STRL (DRAIN) ×4 IMPLANT
BAG DRN ANRFLXCHMBR STRAP LEK (BAG)
BAG DRN UROCATH (DRAIN) ×2
BAG URINE DRAINAGE (UROLOGICAL SUPPLIES) IMPLANT
BAG URINE LEG 19OZ MD ST LTX (BAG) IMPLANT
BASKET LASER NITINOL 1.9FR (BASKET) IMPLANT
BASKET STNLS GEMINI 4WIRE 3FR (BASKET) IMPLANT
BASKET ZERO TIP NITINOL 2.4FR (BASKET) ×2 IMPLANT
BSKT STON RTRVL 120 1.9FR (BASKET)
BSKT STON RTRVL GEM 120X11 3FR (BASKET)
BSKT STON RTRVL ZERO TP 2.4FR (BASKET) ×2
CANISTER SUCT LVC 12 LTR MEDI- (MISCELLANEOUS) ×2 IMPLANT
CATH FOLEY 2WAY SLVR  5CC 20FR (CATHETERS)
CATH FOLEY 2WAY SLVR  5CC 22FR (CATHETERS) ×2
CATH FOLEY 2WAY SLVR 5CC 20FR (CATHETERS) IMPLANT
CATH FOLEY 2WAY SLVR 5CC 22FR (CATHETERS) IMPLANT
CATH INTERMIT  6FR 70CM (CATHETERS) ×2 IMPLANT
CATH URET 5FR 28IN CONE TIP (BALLOONS)
CATH URET 5FR 28IN OPEN ENDED (CATHETERS) IMPLANT
CATH URET 5FR 70CM CONE TIP (BALLOONS) IMPLANT
CLOTH BEACON ORANGE TIMEOUT ST (SAFETY) ×4 IMPLANT
DRAPE CAMERA CLOSED 9X96 (DRAPES) ×4 IMPLANT
ELECT REM PT RETURN 9FT ADLT (ELECTROSURGICAL) ×4
ELECTRODE REM PT RTRN 9FT ADLT (ELECTROSURGICAL) ×2 IMPLANT
EVACUATOR MICROVAS BLADDER (UROLOGICAL SUPPLIES) ×2 IMPLANT
FIBER LASER FLEXIVA 1000 (UROLOGICAL SUPPLIES) IMPLANT
FIBER LASER FLEXIVA 200 (UROLOGICAL SUPPLIES) IMPLANT
FIBER LASER FLEXIVA 365 (UROLOGICAL SUPPLIES) IMPLANT
FIBER LASER FLEXIVA 550 (UROLOGICAL SUPPLIES) IMPLANT
GLOVE BIO SURGEON STRL SZ 6.5 (GLOVE) ×1 IMPLANT
GLOVE BIO SURGEON STRL SZ7 (GLOVE) ×4 IMPLANT
GLOVE BIO SURGEONS STRL SZ 6.5 (GLOVE) ×1
GLOVE INDICATOR 6.5 STRL GRN (GLOVE) ×4 IMPLANT
GOWN PREVENTION PLUS LG XLONG (DISPOSABLE) ×2 IMPLANT
GOWN STRL REUS W/ TWL LRG LVL3 (GOWN DISPOSABLE) IMPLANT
GOWN STRL REUS W/ TWL XL LVL3 (GOWN DISPOSABLE) IMPLANT
GOWN STRL REUS W/TWL LRG LVL3 (GOWN DISPOSABLE) ×4
GOWN STRL REUS W/TWL XL LVL3 (GOWN DISPOSABLE) ×4
GUIDEWIRE 0.038 PTFE COATED (WIRE) IMPLANT
GUIDEWIRE ANG ZIPWIRE 038X150 (WIRE) IMPLANT
GUIDEWIRE STR DUAL SENSOR (WIRE) ×2 IMPLANT
HOLDER FOLEY CATH W/STRAP (MISCELLANEOUS) IMPLANT
IV NS IRRIG 3000ML ARTHROMATIC (IV SOLUTION) ×8 IMPLANT
KIT BALLIN UROMAX 15FX10 (LABEL) IMPLANT
KIT BALLN UROMAX 15FX4 (MISCELLANEOUS) IMPLANT
KIT BALLN UROMAX 26 75X4 (MISCELLANEOUS)
LOOP CUTTING 24FR OLYMPUS (CUTTING LOOP) IMPLANT
PACK CYSTO (CUSTOM PROCEDURE TRAY) ×4 IMPLANT
PLUG CATH AND CAP STER (CATHETERS) ×2 IMPLANT
SET ASPIRATION TUBING (TUBING) ×2 IMPLANT
SET HIGH PRES BAL DIL (LABEL)
SHEATH ACCESS URETERAL 38CM (SHEATH) ×2 IMPLANT
STENT URET 6FRX24 CONTOUR (STENTS) ×2 IMPLANT
SYRINGE IRR TOOMEY STRL 70CC (SYRINGE) ×2 IMPLANT

## 2014-04-07 NOTE — Anesthesia Postprocedure Evaluation (Signed)
Anesthesia Post Note  Patient: [name] [name]  Procedure(s) Performed: Procedure(s) (LRB): TRANSURETHRAL RESECTION OF BLADDER TUMOR (TURBT) (N/A) CYSTOSCOPY/RETROGRADE/URETEROSCOPY WITH BIOPSY OF RENAL PELVIS TUMOR (Right)  Anesthesia type: General  Patient location: PACU  Post pain: Pain level controlled  Post assessment: Post-op Vital signs reviewed  Last Vitals: BP 158/80 mmHg  Pulse 65  Temp(Src) 36.4 C (Oral)  Resp 12  Ht 5' 10.5" (1.791 m)  Wt 234 lb (106.142 kg)  BMI 33.09 kg/m2  SpO2 96%  Post vital signs: Reviewed  Level of consciousness: sedated  Complications: Residual neuromuscular blockade at emergence requiring brief LMA placement. No other apparent anesthesia complications.

## 2014-04-07 NOTE — Anesthesia Procedure Notes (Signed)
Procedure Name: Intubation Date/Time: 04/07/2014 10:53 AM Performed by: Wanita Chamberlain Pre-anesthesia Checklist: Patient identified, Emergency Drugs available, Suction available, Patient being monitored and Timeout performed Patient Re-evaluated:Patient Re-evaluated prior to inductionOxygen Delivery Method: Circle system utilized Preoxygenation: Pre-oxygenation with 100% oxygen Intubation Type: IV induction Ventilation: Mask ventilation without difficulty Laryngoscope Size: Mac and 4 Grade View: Grade I Tube type: Oral Tube size: 8.0 mm Number of attempts: 1 Airway Equipment and Method: Bite block and Stylet Placement Confirmation: positive ETCO2,  ETT inserted through vocal cords under direct vision and breath sounds checked- equal and bilateral Secured at: 22 cm Tube secured with: Tape Dental Injury: Teeth and Oropharynx as per pre-operative assessment

## 2014-04-07 NOTE — Transfer of Care (Signed)
Immediate Anesthesia Transfer of Care Note  Patient: Tahsin C Wickey  Procedure(s) Performed: Procedure(s): TRANSURETHRAL RESECTION OF BLADDER TUMOR (TURBT) (N/A) CYSTOSCOPY/RETROGRADE/URETEROSCOPY WITH BIOPSY OF RENAL PELVIS TUMOR (Right)  Patient Location: PACU  Anesthesia Type:General  Level of Consciousness: awake, alert , oriented and patient cooperative  Airway & Oxygen Therapy: Patient Spontanous Breathing and Patient connected to nasal cannula oxygen  Post-op Assessment: Report given to PACU RN and Post -op Vital signs reviewed and stable  Post vital signs: Reviewed and stable  Complications: No apparent anesthesia complications

## 2014-04-07 NOTE — Anesthesia Preprocedure Evaluation (Addendum)
Anesthesia Evaluation  Patient identified by MRN, date of birth, ID band Patient awake    Reviewed: Allergy & Precautions, H&P , NPO status , Patient's Chart, lab work & pertinent test results  History of Anesthesia Complications Negative for: history of anesthetic complications  Airway Mallampati: II  TM Distance: >3 FB Neck ROM: Full    Dental  (+) Teeth Intact, Dental Advisory Given,    Pulmonary sleep apnea and Continuous Positive Airway Pressure Ventilation , former smoker,    Pulmonary exam normal       Cardiovascular + CAD and + Past MI  EF: Roughly 45-50 % Wall Motion: Mild to moderate basal to mid inferior hypokinesis.    Neuro/Psych  Headaches, PSYCHIATRIC DISORDERS Dementia TIA   GI/Hepatic Neg liver ROS, hiatal hernia, GERD-  Medicated,  Endo/Other  diabetes, Type 2Hypothyroidism Hyperthyroidism   Renal/GU Renal disease     Musculoskeletal  (+) Arthritis -,   Abdominal   Peds  Hematology  (+) anemia ,   Anesthesia Other Findings   Reproductive/Obstetrics negative OB ROS                           Anesthesia Physical  Anesthesia Plan  ASA: III  Anesthesia Plan: General   Post-op Pain Management:    Induction: Intravenous  Airway Management Planned: LMA and Oral ETT  Additional Equipment:   Intra-op Plan:   Post-operative Plan: Extubation in OR  Informed Consent: I have reviewed the patients History and Physical, chart, labs and discussed the procedure including the risks, benefits and alternatives for the proposed anesthesia with the patient or authorized representative who has indicated his/her understanding and acceptance.   Dental advisory given  Plan Discussed with: CRNA and Anesthesiologist  Anesthesia Plan Comments:        Anesthesia Quick Evaluation

## 2014-04-07 NOTE — Op Note (Signed)
Isaul Landi Ninh is a 80 y.o.   04/07/2014  General  Preop diagnosis: Papillary bladder tumor, tumor right renal pelvis.  Postop diagnosis: Same  Procedure done: Cystoscopy, right retrograde pyelogram, ureteroscopy, biopsy of renal pelvis tumor, insertion of double-J stent. TUR bladder tumors.  Surgeon: Charlene Brooke. Nolon Yellin  Anesthesia: Gen.  Indication: Patient is a 80 years old male who was referred to our office by Dr. Michela Pitcher for management of the bladder tumor. Patient has a history of gross hematuria. He had a cystoscopy in [place] that showed a recurrent tumor on the right lateral wall of the bladder. CT scan showed a papillary tumor in the right collecting system. He is scheduled today for cystoscopy, right retrograde pyelogram, ureteroscopy with biopsy of the renal pelvis tumor, TUR bladder tumor.  Procedure: Patient was identified by his wrist band and proper timeout was taken.  Under general anesthesia he was prepped and draped and placed in the dorsolithotomy position. A panendoscope was inserted in the bladder. The anterior urethra is normal. He has moderate prostatic hypertrophy. There is a 4 cm papillary tumor on the right lateral wall of the bladder above the ureteral orifice. There is also a small tumor at the base of the bladder in the midline. There is no stone in the bladder. The ureteral orifices are in normal position and shape.  Right retrograde pyelogram:  A cone-tip catheter was passed and through the cystoscope and the right ureteral orifice. Contrast was injected through the cone-tip catheter. The ureter appears normal. There is a filling defect in the renal pelvis consistent with the known papillary tumor of the collecting system. The cone-tip catheter was removed.  A sensor wire was passed through the cystoscope and the right ureter. A ureteroscope access sheath was then passed over the sensor wire up to the renal pelvis. The inner sheath of the ureteroscope access  sheath was removed as well as the sensor wire. A digital ureteroscope was then passed through the access sheath. A papillary tumor was clearly seen in the renal pelvis. With a Nitinol basket a biopsy of the papillary tumor was done. A brush biopsy was then passed through the ureteroscope and brush biopsy of the renal pelvis tumor was also done. The ureteroscope was removed. The renal pelvis was then irrigated with normal saline and washings were sent for cytology.  The sensor wire was then passed through the ureteroscope and the access sheath was removed. The sensor wire was then backloaded into the cystoscope and a #6 French-24 double-J stent was passed over the sensor wire. The proximal curl of the double-J stent is in the renal pelvis. The distal curl of the stent is in the bladder. The cystoscope was removed.  The urethra was then dilated with Von Buren sounds up to #28 [place]. A gyrus resectoscope was then inserted in the bladder. Resection of the the larger tumor on the right lateral wall of the bladder was then done with the resectoscope. The specimen was then irrigated out of the bladder and sent to pathology. The base of the bladder tumor was then resected for staging purposes. The small 0.5 cm tumor at the base of the bladder was then fulgurated. The margins of resection of the bladder tumor were then fulgurated over a 2 cm margin. Hemostasis was secured with electrocautery. There was no bleeding at the end of the procedure. The resectoscope was removed. A #18 French Foley catheter was then inserted in the bladder.  40 mg of mitomycin-C in 40  mL of water would be instilled in the bladder in the recovery room. That would be left in place for 45 minutes.  Patient tolerated the procedure well and left the OR in satisfactory condition to postanesthesia care unit  EBL: Minimal

## 2014-04-07 NOTE — H&P (Signed)
History of Present Illness [name] [name] returns for follow-up gross hematuria and bladder tumor. His urine is grossly clear at this time. He is known to have a bladder tumor on the right lateral wall of the bladder. CT scan showed also a papillary tumor in the right collecting system. He needs cystoscopy, TURBT, ureteroscopy with biopsy of the papillary renal tumor.   Past Medical History Problems  1. History of Anxiety (F41.9) 2. History of cardiac arrhythmia (Z86.79) 3. History of cardiac disorder (Z86.79) 4. History of depression (Z86.59) 5. History of heartburn (Z87.898) 6. History of hypothyroidism (Z86.39) 7. History of myocardial infarction (I25.2) 8. History of sleep apnea (Z87.09) 9. History of stroke (I69.62)  Surgical History Problems  1. History of Coronary Artery Single Venous Bypass Graft 2. History of Cystoscopy With Biopsy 3. History of Cystoscopy With Resection Of Tumor 4. History of Transurethral Resection Of Prostate (TURP)  Current Meds 1. Aricept 10 MG Oral Tablet (Donepezil HCl);  Therapy: (Recorded:23Nov2015) to Recorded 2. Aspirin 325 MG Oral Tablet;  Therapy: (Recorded:23Nov2015) to Recorded 3. Hydrocortisone Valerate 0.2 % External Cream;  Therapy: (Recorded:23Nov2015) to Recorded 4. Lexapro 20 MG Oral Tablet (Escitalopram Oxalate);  Therapy: (Recorded:23Nov2015) to Recorded 5. Midodrine HCl - 10 MG Oral Tablet;  Therapy: (Recorded:23Nov2015) to Recorded 6. MiraLax PACK (Polyethylene Glycol 3350);  Therapy: (Recorded:23Nov2015) to Recorded 7. Protonix 40 MG Oral Tablet Delayed Release (Pantoprazole Sodium);  Therapy: (Recorded:23Nov2015) to Recorded 8. Synthroid 125 MCG Oral Tablet (Levothyroxine Sodium);  Therapy: (Recorded:23Nov2015) to Recorded  Allergies Medication  1. No Known Drug Allergies  Family History Problems  1. Family history of cardiac disorder (Z82.49) : Mother 2. Family history of kidney stones (Z84.1) : Father 3. Family history  of prostate cancer (Z80.42) : Father  Social History Problems  1. Denied: History of Alcohol use 2. Caffeine use (F15.90)   1 3. Mother alive and healthy   89 4. Never a smoker 5. Number of children   3 sons 84. Retired 41. Single  Review of Systems Genitourinary, constitutional, skin, eye, otolaryngeal, hematologic/lymphatic, cardiovascular, pulmonary, endocrine, musculoskeletal, gastrointestinal, neurological and psychiatric system(s) were reviewed and pertinent findings if present are noted and are otherwise negative.  Genitourinary: hematuria.    Physical Exam Constitutional: Well nourished and well developed . No acute distress.  ENT:. The ears and nose are normal in appearance.  Neck: The appearance of the neck is normal and no neck mass is present.  Pulmonary: No respiratory distress and normal respiratory rhythm and effort.  Cardiovascular: Heart rate and rhythm are normal . No peripheral edema.  Abdomen: The abdomen is soft and nontender. No masses are palpated. No CVA tenderness. No hernias are palpable. No hepatosplenomegaly noted.  Genitourinary: Examination of the penis demonstrates no discharge, no masses, no lesions and a normal meatus. The scrotum is without lesions. The right epididymis is palpably normal and non-tender. The left epididymis is palpably normal and non-tender. The right testis is non-tender and without masses. The left testis is non-tender and without masses.  Lymphatics: The femoral and inguinal nodes are not enlarged or tender.  Skin: Normal skin turgor, no visible rash and no visible skin lesions.  Neuro/Psych:. Mood and affect are appropriate.    Results/Data Urine [Data Includes: Last 1 Day]   [id number]  COLOR YELLOW   APPEARANCE CLOUDY   SPECIFIC GRAVITY 1.015   pH 6.0   GLUCOSE NEG mg/dL  BILIRUBIN NEG   KETONE NEG mg/dL  BLOOD SMALL   PROTEIN NEG mg/dL  UROBILINOGEN  0.2 mg/dL  NITRITE NEG   LEUKOCYTE ESTERASE TRACE   SQUAMOUS  EPITHELIAL/HPF FEW   WBC 21-50 WBC/hpf  RBC 3-6 RBC/hpf  BACTERIA FEW   CRYSTALS NONE SEEN   CASTS NONE SEEN   Other MUCUS NOTED    I independently reviewed the CT scan with the patient and his daughter in law. The findings are as noted above.   Assessment Assessed  1. Gross hematuria (R31.0) 2. Bladder tumor (D49.4) 3. 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 UA With REFLEX; [Do Not Release]; Status:Resulted - Requires Verification;   Done:  [id number] 10:22AM  Cystoscopy, TURBT, ureteroscopy, biopsy of the renal mass, JJ stent. The procedure, risks, benefits were explained to the patient and his daughter in law. the risks include but are not limited to hemorrhage, infection, bladder injury, renal or ureteral injury. They understand and wish to proceed.

## 2014-04-07 NOTE — Discharge Instructions (Signed)
Transurethral Procedure  Medications: Resume all your other meds from home  Activity: 1. No heavy lifting > 10 pounds for 2 weeks. 2. No sexual activity for 2 weeks. 3. No strenuous activity for 2 weeks. 4. No driving while on narcotic pain medications. 5. Drink plenty of water. 6. Continue to walk at home - you can still get blood clots when you are at home so keep active but don't over do it. 7. Your urine may have some blood in it - make sure you drink plenty of water. Call or come to the ER immediately if your catheter stops draining or you are unable to urinate.  Foley Catheter ( if you go home with a catheter in place ) 1. Make sure your catheter is attached to your leg at all times - do not let anything pull on it. 2. If the urine in your tube starts looking dark red or if it stops draining, call us immediately    or come to the ER. 3. Drink plenty of water. If you notice your urine looking dark, sit down, relax and drink lots of water. 4. You will be given a leg bag as well as an overnight bag for your catheter- MAKE SURE CATHETER IS ATTACHED TO YOUR LEG AT ALL TIMES WITH TAPE OR LEG STRAP  Bathing: You can shower. You can take a bath unless you have a foley catheter in place.  Signs / Symptoms to call: 1. Call if you have a fever greater than 101.5  2. Uncontrolled nausea / vomiting, uncontrolled pain / dizziness, unable to urinate, leg swelling / leg pain, or any other concerns.   You can reach [place] at [phone]    Foley Catheter Care A Foley catheter is a soft, flexible tube that is placed into the bladder to drain urine. A Foley catheter may be inserted if:  You leak urine or are not able to control when you urinate (urinary incontinence).  You are not able to urinate when you need to (urinary retention).  You had prostate surgery or surgery on the genitals.  You have certain medical conditions, such as multiple  sclerosis, dementia, or a spinal cord injury. If you are going home with a Foley catheter in place, follow the instructions below. TAKING CARE OF THE CATHETER 1. Wash your hands with soap and water. 2. Using mild soap and warm water on a clean washcloth:  Clean the area on your body closest to the catheter insertion site using a circular motion, moving away from the catheter. Never wipe toward the catheter because this could sweep bacteria up into the urethra and cause infection.  Remove all traces of soap. Pat the area dry with a clean towel. For males, reposition the foreskin. 3. Attach the catheter to your leg so there is no tension on the catheter. Use adhesive tape or a leg strap. If you are using adhesive tape, remove any sticky residue left behind by the previous tape you used. 4. Keep the drainage bag below the level of the bladder, but keep it off the floor. 5. Check throughout the day to be sure the catheter is working and urine is draining freely. Make sure the tubing does not become kinked. 6. Do not pull on the catheter or try to remove it. Pulling could damage internal tissues. TAKING CARE OF THE DRAINAGE BAGS You will be given two drainage bags to take home. One is a large overnight drainage bag, and the other is a smaller  leg bag that fits underneath clothing. You may wear the overnight bag at any time, but you should never wear the smaller leg bag at night. Follow the instructions below for how to empty, change, and clean your drainage bags. Emptying the Drainage Bag You must empty your drainage bag when it is  - full or at least 2-3 times a day. 1. Wash your hands with soap and water. 2. Keep the drainage bag below your hips, below the level of your bladder. This stops urine from going back into the tubing and into your bladder. 3. Hold the dirty bag over the toilet or a clean container. 4. Open the pour spout at the bottom of the bag and empty the urine into the toilet or  container. Do not let the pour spout touch the toilet, container, or any other surface. Doing so can place bacteria on the bag, which can cause an infection. 5. Clean the pour spout with a gauze pad or cotton ball that has rubbing alcohol on it. 6. Close the pour spout. 7. Attach the bag to your leg with adhesive tape or a leg strap. 8. Wash your hands well. Changing the Drainage Bag Change your drainage bag once a month or sooner if it starts to smell bad or look dirty. Below are steps to follow when changing the drainage bag. 1. Wash your hands with soap and water. 2. Pinch off the rubber catheter so that urine does not spill out. 3. Disconnect the catheter tube from the drainage tube at the connection valve. Do not let the tubes touch any surface. 4. Clean the end of the catheter tube with an alcohol wipe. Use a different alcohol wipe to clean the end of the drainage tube. 5. Connect the catheter tube to the drainage tube of the clean drainage bag. 6. Attach the new bag to the leg with adhesive tape or a leg strap. Avoid attaching the new bag too tightly. 7. Wash your hands well. Cleaning the Drainage Bag 1. Wash your hands with soap and water. 2. Wash the bag in warm, soapy water. 3. Rinse the bag thoroughly with warm water. 4. Fill the bag with a solution of white vinegar and water (1 cup vinegar to 1 qt warm water [.2 L vinegar to 1 L warm water]). Close the bag and soak it for 30 minutes in the solution. 5. Rinse the bag with warm water. 6. Hang the bag to dry with the pour spout open and hanging downward. 7. Store the clean bag (once it is dry) in a clean plastic bag. 8. Wash your hands well. PREVENTING INFECTION  Wash your hands before and after handling your catheter.  Take showers daily and wash the area where the catheter enters your body. Do not take baths. Replace wet leg straps with dry ones, if this applies.  Do not use powders, sprays, or lotions on the genital area.  Only use creams, lotions, or ointments as directed by your caregiver.  For females, wipe from front to back after each bowel movement.  Drink enough fluids to keep your urine clear or pale yellow unless you have a fluid restriction.  Do not let the drainage bag or tubing touch or lie on the floor.  Wear cotton underwear to absorb moisture and to keep your skin drier. SEEK MEDICAL CARE IF:   Your urine is cloudy or smells unusually bad.  Your catheter becomes clogged.  You are not draining urine into the bag or your  bladder feels full.  Your catheter starts to leak. SEEK IMMEDIATE MEDICAL CARE IF:   You have pain, swelling, redness, or pus where the catheter enters the body.  You have pain in the abdomen, legs, lower back, or bladder.  You have a fever.  You see blood fill the catheter, or your urine is pink or red.  You have nausea, vomiting, or chills.  Your catheter gets pulled out. MAKE SURE YOU:   Understand these instructions.  Will watch your condition.  Will get help right away if you are not doing well or get worse. Document Released: 03/27/2005 Document Revised: 08/11/2013 Document Reviewed: 03/18/2012 [facility] Patient Information 2015 [place], [place]. This information is not intended to replace advice given to you by your health care provider. Make sure you discuss any questions you have with your health care provider.     Post Anesthesia Home Care Instructions  Activity: Get plenty of rest for the remainder of the day. A responsible adult should stay with you for 24 hours following the procedure.  For the next 24 hours, DO NOT: -Drive a car -Paediatric nurse -Drink alcoholic beverages -Take any medication unless instructed by your physician -Make any legal decisions or sign important papers.  Meals: Start with liquid foods such as gelatin or soup. Progress to regular foods as tolerated. Avoid greasy, spicy, heavy foods. If nausea and/or vomiting  occur, drink only clear liquids until the nausea and/or vomiting subsides. Call your physician if vomiting continues.  Special Instructions/Symptoms: Your throat may feel dry or sore from the anesthesia or the breathing tube placed in your throat during surgery. If this causes discomfort, gargle with warm salt water. The discomfort should disappear within 24 hours.

## 2014-04-08 ENCOUNTER — Encounter (HOSPITAL_BASED_OUTPATIENT_CLINIC_OR_DEPARTMENT_OTHER): Payer: Self-pay | Admitting: Urology

## 2014-04-13 ENCOUNTER — Encounter: Payer: Self-pay | Admitting: *Deleted

## 2014-04-14 DIAGNOSIS — C672 Malignant neoplasm of lateral wall of bladder: Secondary | ICD-10-CM | POA: Diagnosis not present

## 2014-04-14 DIAGNOSIS — C659 Malignant neoplasm of unspecified renal pelvis: Secondary | ICD-10-CM | POA: Diagnosis not present

## 2014-04-21 DIAGNOSIS — R310 Gross hematuria: Secondary | ICD-10-CM | POA: Diagnosis not present

## 2014-04-21 DIAGNOSIS — C672 Malignant neoplasm of lateral wall of bladder: Secondary | ICD-10-CM | POA: Diagnosis not present

## 2014-04-21 DIAGNOSIS — C659 Malignant neoplasm of unspecified renal pelvis: Secondary | ICD-10-CM | POA: Diagnosis not present

## 2014-04-21 DIAGNOSIS — C61 Malignant neoplasm of prostate: Secondary | ICD-10-CM | POA: Diagnosis not present

## 2014-04-22 ENCOUNTER — Ambulatory Visit (INDEPENDENT_AMBULATORY_CARE_PROVIDER_SITE_OTHER): Payer: Medicare Other | Admitting: Cardiovascular Disease

## 2014-04-22 ENCOUNTER — Encounter: Payer: Self-pay | Admitting: Cardiovascular Disease

## 2014-04-22 VITALS — BP 120/70 | HR 53 | Ht 70.5 in | Wt 234.0 lb

## 2014-04-22 DIAGNOSIS — Z951 Presence of aortocoronary bypass graft: Secondary | ICD-10-CM | POA: Diagnosis not present

## 2014-04-22 DIAGNOSIS — E785 Hyperlipidemia, unspecified: Secondary | ICD-10-CM

## 2014-04-22 DIAGNOSIS — I951 Orthostatic hypotension: Secondary | ICD-10-CM

## 2014-04-22 NOTE — Progress Notes (Signed)
04/22/2014 [name] [name]   12-08-1933  [id number]  Primary Physician Purvis Kilts, MD Primary Cardiologist: Lorretta Harp MD Renae Gloss   HPI:  The patient is a 81 year old, moderately overweight, widowed Caucasian male, father of 3 who I last saw 02/24/14.[name] He has a history of hyperlipidemia intolerant to statin drugs and obstructive sleep apnea on CPAP occasionally. He has been asymptomatic since I last saw him. His last Myoview performed 2 years ago was nonischemic.  His major complaint is of dizziness which sounds orthostatic. He has seen an ENT doctor who has ruled out in her ear problems. He had an ST segment elevation myocardial infarction treated with intervention of his right coronary artery by Dr. Ellyn Hack on 12/17/13. He subsequently underwent coronary artery bypass grafting the following day by Dr. Arvid Right.  He had a LIMA to his LAD, vein graft to an obtuse marginal branch, diagonal branch and PDA. His EF was 45-50%.His major issues have been with symptomatic with hypotension. He was started on Midrin in the past without improvement. He was admitted to [facility] a month later with similar symptoms of orthostatic hypotension and Florinef was started. Midodrin was uptitrated. He is currently asymptomatic except for early morning headache prior to taking his antihypertensive medications. He apparently has a kidney tumor requiring nephrectomy in the near future which I think he is low risk from a cardiovascular point of view to have.   Current Outpatient Prescriptions  Medication Sig Dispense Refill  . aspirin EC 325 MG EC tablet Take 1 tablet (325 mg total) by mouth daily. 30 tablet 0  . bisacodyl (DULCOLAX) 5 MG EC tablet Take 5 mg by mouth daily as needed for moderate constipation.    [name] donepezil (ARICEPT) 10 MG tablet Take 10 mg by mouth at bedtime.    [name] escitalopram (LEXAPRO) 10 MG tablet Take 10 mg by mouth every morning.     . fludrocortisone  (FLORINEF) 0.1 MG tablet Take 2 tablets (0.2 mg total) by mouth daily. (Patient taking differently: Take 0.2 mg by mouth every morning. ) 60 tablet 11  . levothyroxine (SYNTHROID, LEVOTHROID) 112 MCG tablet Take 112 mcg by mouth daily before breakfast.    . midodrine (PROAMATINE) 10 MG tablet Take 1 tablet (10 mg total) by mouth 2 (two) times daily with breakfast and lunch. 60 tablet 1  . pantoprazole (PROTONIX) 40 MG tablet Take 40 mg by mouth every morning. ACID REFLUX/ GERD    . pantoprazole (PROTONIX) 40 MG tablet TAKE ONE TABLET EVERY MORNING 30 tablet 4  . polyethylene glycol (MIRALAX / GLYCOLAX) packet Take 17 g by mouth at bedtime.     No current facility-administered medications for this visit.    Allergies  Allergen Reactions  . Statins Other (See Comments)    MUSCLE ACHES    History   Social History  . Marital Status: Divorced    Spouse Name: N/A    Number of Children: 3  . Years of Education: HS   Occupational History  .     Social History Main Topics  . Smoking status: Former Smoker -- 1.00 packs/day for 4 years    Types: Cigarettes  . Smokeless tobacco: Former Systems developer    Types: [place] date: 01/03/1960  . Alcohol Use: No  . Drug Use: No  . Sexual Activity: Yes    Birth Control/ Protection: None   Other Topics Concern  . Not on file   Social  History Narrative   Patient is single and lives alone.   Patient is retired.   Patient has a high school education.   Patient is right-handed.   Patient drinks one cup of coffee and one cup of soda or tea daily.   Patient has three adult children.     Review of Systems: General: negative for chills, fever, night sweats or weight changes.  Cardiovascular: negative for chest pain, dyspnea on exertion, edema, orthopnea, palpitations, paroxysmal nocturnal dyspnea or shortness of breath Dermatological: negative for rash Respiratory: negative for cough or wheezing Urologic: negative for hematuria Abdominal:  negative for nausea, vomiting, diarrhea, bright red blood per rectum, melena, or hematemesis Neurologic: negative for visual changes, syncope, or dizziness All other systems reviewed and are otherwise negative except as noted above.    Blood pressure 120/70, pulse 53, height 5' 10.5" (1.791 m), weight 234 lb (106.142 kg).  General appearance: alert and no distress Neck: no adenopathy, no carotid bruit, no JVD, supple, symmetrical, trachea midline and thyroid not enlarged, symmetric, no tenderness/mass/nodules Lungs: clear to auscultation bilaterally Heart: regular rate and rhythm, S1, S2 normal, no murmur, click, rub or gallop Extremities: extremities normal, atraumatic, no cyanosis or edema  EKG not performed today  ASSESSMENT AND PLAN:   S/P CABG x 4 History of inferior STEMI 12/17/13 with catheterization performed by Dr. Ellyn Hack revealing three-vessel disease with an ejection fraction of 45-50%. The following day he underwent coronary artery bypass grafting with LIMA to LAD, vein to diagonal branch, obtuse marginal branch and PDA. His postop course was uncomplicated. He denies chest pain or shortness of breath.   Orthostatic hypotension History of postoperative orthostatic hypotension on midodrine and Florinef. These drugs have been titrated to the point where he is now asymptomatic other than morning hypertension which improved over the course the day after he takes his medications.   Hyperlipidemia History of hyperlipidemia currently not on a statin drug       Lorretta Harp MD [facility], [facility] 04/22/2014 3:10 PM

## 2014-04-22 NOTE — Assessment & Plan Note (Signed)
History of hyperlipidemia currently not on a statin drug

## 2014-04-22 NOTE — Assessment & Plan Note (Signed)
History of inferior STEMI 12/17/13 with catheterization performed by Dr. Ellyn Hack revealing three-vessel disease with an ejection fraction of 45-50%. The following day he underwent coronary artery bypass grafting with LIMA to LAD, vein to diagonal branch, obtuse marginal branch and PDA. His postop course was uncomplicated. He denies chest pain or shortness of breath.

## 2014-04-22 NOTE — Patient Instructions (Signed)
We request that you follow-up in: 6 months with an extender and in 12 months with Dr Berry  You will receive a reminder letter in the mail two months in advance. If you don't receive a letter, please call our office to schedule the follow-up appointment.   

## 2014-04-22 NOTE — Assessment & Plan Note (Signed)
History of postoperative orthostatic hypotension on midodrine and Florinef. These drugs have been titrated to the point where he is now asymptomatic other than morning hypertension which improved over the course the day after he takes his medications.

## 2014-04-27 ENCOUNTER — Other Ambulatory Visit: Payer: Self-pay

## 2014-04-27 MED ORDER — MIDODRINE HCL 10 MG PO TABS: 10.0000 mg | ORAL_TABLET | Freq: Two times a day (BID) | ORAL | Status: DC

## 2014-04-27 NOTE — Telephone Encounter (Signed)
Rx sent to pharmacy   

## 2014-04-28 ENCOUNTER — Telehealth (INDEPENDENT_AMBULATORY_CARE_PROVIDER_SITE_OTHER): Payer: Self-pay | Admitting: Internal Medicine

## 2014-04-28 DIAGNOSIS — K219 Gastro-esophageal reflux disease without esophagitis: Secondary | ICD-10-CM

## 2014-04-28 MED ORDER — PANTOPRAZOLE SODIUM 40 MG PO TBEC: 40.0000 mg | DELAYED_RELEASE_TABLET | Freq: Every morning | ORAL | Status: DC

## 2014-04-28 NOTE — Telephone Encounter (Signed)
Rx sent 

## 2014-04-29 ENCOUNTER — Other Ambulatory Visit: Payer: Self-pay | Admitting: Cardiovascular Disease

## 2014-04-29 ENCOUNTER — Other Ambulatory Visit: Payer: Self-pay | Admitting: Urology

## 2014-05-01 DIAGNOSIS — M6281 Muscle weakness (generalized): Secondary | ICD-10-CM | POA: Diagnosis not present

## 2014-05-01 DIAGNOSIS — I259 Chronic ischemic heart disease, unspecified: Secondary | ICD-10-CM | POA: Diagnosis not present

## 2014-05-11 DIAGNOSIS — Z681 Body mass index (BMI) 19 or less, adult: Secondary | ICD-10-CM | POA: Diagnosis not present

## 2014-05-11 DIAGNOSIS — Z23 Encounter for immunization: Secondary | ICD-10-CM | POA: Diagnosis not present

## 2014-05-11 DIAGNOSIS — I10 Essential (primary) hypertension: Secondary | ICD-10-CM | POA: Diagnosis not present

## 2014-05-11 DIAGNOSIS — D511 Vitamin B12 deficiency anemia due to selective vitamin B12 malabsorption with proteinuria: Secondary | ICD-10-CM | POA: Diagnosis not present

## 2014-05-11 DIAGNOSIS — E119 Type 2 diabetes mellitus without complications: Secondary | ICD-10-CM | POA: Diagnosis not present

## 2014-05-11 DIAGNOSIS — E782 Mixed hyperlipidemia: Secondary | ICD-10-CM | POA: Diagnosis not present

## 2014-05-11 HISTORY — PX: HERNIA REPAIR: SHX51

## 2014-05-26 DIAGNOSIS — F4320 Adjustment disorder, unspecified: Secondary | ICD-10-CM | POA: Diagnosis not present

## 2014-05-26 DIAGNOSIS — N342 Other urethritis: Secondary | ICD-10-CM | POA: Diagnosis not present

## 2014-05-26 DIAGNOSIS — Z6834 Body mass index (BMI) 34.0-34.9, adult: Secondary | ICD-10-CM | POA: Diagnosis not present

## 2014-05-26 DIAGNOSIS — E6609 Other obesity due to excess calories: Secondary | ICD-10-CM | POA: Diagnosis not present

## 2014-05-29 NOTE — Patient Instructions (Addendum)
CHUN SELLEN  05/29/2014   Your procedure is scheduled on: Friday 06/05/2014  Report to [facility] Main  Entrance and follow signs to               [place] at 41 AM.  Call this number if you have problems the morning of surgery [phone]   Remember:FOLLOW BOWEL PREP INSTRUCTIONS PER DR. HERRICK'S OFFICE WITH CLEAR LIQUIDS THE DAY BEFORE SURGERY!   Do not eat food  :After Midnight.  MAY HAVE CLEAR LIQUIDS FROM MIDNIGHT UP UNTIL 0715 AM THEN NOTHING UNTIL AFTER SURGERY!     Take these medicines the morning of surgery with A SIP OF WATER: Lexapro,Protonix, Midodrine                               You may not have any metal on your body including hair pins and              piercings  Do not wear jewelry, make-up, lotions, powders or perfumes.             Do not wear nail polish.  Do not shave  48 hours prior to surgery.              Men may shave face and neck.   Do not bring valuables to the hospital. [place].  Contacts, dentures or bridgework may not be worn into surgery.  Leave suitcase in the car. After surgery it may be brought to your room.     Patients discharged the day of surgery will not be allowed to drive home.  Name and phone number of your driver:  Special Instructions: N/A              Please read over the following fact sheets you were given: _____________________________________________________________________             [facility] - Preparing for Surgery Before surgery, you can play an important role.  Because skin is not sterile, your skin needs to be as free of germs as possible.  You can reduce the number of germs on your skin by washing with CHG (chlorahexidine gluconate) soap before surgery.  CHG is an antiseptic cleaner which kills germs and bonds with the skin to continue killing germs even after washing. Please DO NOT use if you have an allergy to CHG or  antibacterial soaps.  If your skin becomes reddened/irritated stop using the CHG and inform your nurse when you arrive at Short Stay. Do not shave (including legs and underarms) for at least 48 hours prior to the first CHG shower.  You may shave your face/neck. Please follow these instructions carefully:  1.  Shower with CHG Soap the night before surgery and the  morning of Surgery.  2.  If you choose to wash your hair, wash your hair first as usual with your  normal  shampoo.  3.  After you shampoo, rinse your hair and body thoroughly to remove the  shampoo.                           4.  Use CHG as you would any other liquid soap.  You can apply chg  directly  to the skin and wash                       Gently with a scrungie or clean washcloth.  5.  Apply the CHG Soap to your body ONLY FROM THE NECK DOWN.   Do not use on face/ open                           Wound or open sores. Avoid contact with eyes, ears mouth and genitals (private parts).                       Wash face,  Genitals (private parts) with your normal soap.             6.  Wash thoroughly, paying special attention to the area where your surgery  will be performed.  7.  Thoroughly rinse your body with warm water from the neck down.  8.  DO NOT shower/wash with your normal soap after using and rinsing off  the CHG Soap.                9.  Pat yourself dry with a clean towel.            10.  Wear clean pajamas.            11.  Place clean sheets on your bed the night of your first shower and do not  sleep with pets. Day of Surgery : Do not apply any lotions/deodorants the morning of surgery.  Please wear clean clothes to the hospital/surgery center.  FAILURE TO FOLLOW THESE INSTRUCTIONS MAY RESULT IN THE CANCELLATION OF YOUR SURGERY PATIENT SIGNATURE_________________________________  NURSE SIGNATURE__________________________________  ________________________________________________________________________   Adam Phenix  An incentive spirometer is a tool that can help keep your lungs clear and active. This tool measures how well you are filling your lungs with each breath. Taking long deep breaths may help reverse or decrease the chance of developing breathing (pulmonary) problems (especially infection) following:  A long period of time when you are unable to move or be active. BEFORE THE PROCEDURE   If the spirometer includes an indicator to show your best effort, your nurse or respiratory therapist will set it to a desired goal.  If possible, sit up straight or lean slightly forward. Try not to slouch.  Hold the incentive spirometer in an upright position. INSTRUCTIONS FOR USE   Sit on the edge of your bed if possible, or sit up as far as you can in bed or on a chair.  Hold the incentive spirometer in an upright position.  Breathe out normally.  Place the mouthpiece in your mouth and seal your lips tightly around it.  Breathe in slowly and as deeply as possible, raising the piston or the ball toward the top of the column.  Hold your breath for 3-5 seconds or for as long as possible. Allow the piston or ball to fall to the bottom of the column.  Remove the mouthpiece from your mouth and breathe out normally.  Rest for a few seconds and repeat Steps 1 through 7 at least 10 times every 1-2 hours when you are awake. Take your time and take a few normal breaths between deep breaths.  The spirometer may include an indicator to show your best effort. Use the indicator as a goal to work toward during each repetition.  After each set of 10 deep breaths, practice coughing to be sure your lungs are clear. If you have an incision (the cut made at the time of surgery), support your incision when coughing by placing a pillow or rolled up towels firmly against it. Once you are able to get out of bed, walk around indoors and cough well. You may stop using the incentive spirometer when instructed by  your caregiver.  RISKS AND COMPLICATIONS  Take your time so you do not get dizzy or light-headed.  If you are in pain, you may need to take or ask for pain medication before doing incentive spirometry. It is harder to take a deep breath if you are having pain. AFTER USE  Rest and breathe slowly and easily.  It can be helpful to keep track of a log of your progress. Your caregiver can provide you with a simple table to help with this. If you are using the spirometer at home, follow these instructions: [place] IF:   You are having difficultly using the spirometer.  You have trouble using the spirometer as often as instructed.  Your pain medication is not giving enough relief while using the spirometer.  You develop fever of 100.5 F (38.1 C) or higher. SEEK IMMEDIATE MEDICAL CARE IF:   You cough up bloody sputum that had not been present before.  You develop fever of 102 F (38.9 C) or greater.  You develop worsening pain at or near the incision site. MAKE SURE YOU:   Understand these instructions.  Will watch your condition.  Will get help right away if you are not doing well or get worse. Document Released: 08/07/2006 Document Revised: 06/19/2011 Document Reviewed: 10/08/2006 ExitCare Patient Information 2014 ExitCare, [place].   ________________________________________________________________________  WHAT IS A BLOOD TRANSFUSION? Blood Transfusion Information  A transfusion is the replacement of blood or some of its parts. Blood is made up of multiple cells which provide different functions.  Red blood cells carry oxygen and are used for blood loss replacement.  White blood cells fight against infection.  Platelets control bleeding.  Plasma helps clot blood.  Other blood products are available for specialized needs, such as hemophilia or other clotting disorders. BEFORE THE TRANSFUSION  Who gives blood for transfusions?   Healthy volunteers who are  fully evaluated to make sure their blood is safe. This is blood bank blood. Transfusion therapy is the safest it has ever been in the practice of medicine. Before blood is taken from a donor, a complete history is taken to make sure that person has no history of diseases nor engages in risky social behavior (examples are intravenous drug use or sexual activity with multiple partners). The donor's travel history is screened to minimize risk of transmitting infections, such as malaria. The donated blood is tested for signs of infectious diseases, such as HIV and hepatitis. The blood is then tested to be sure it is compatible with you in order to minimize the chance of a transfusion reaction. If you or a relative donates blood, this is often done in anticipation of surgery and is not appropriate for emergency situations. It takes many days to process the donated blood. RISKS AND COMPLICATIONS Although transfusion therapy is very safe and saves many lives, the main dangers of transfusion include:   Getting an infectious disease.  Developing a transfusion reaction. This is an allergic reaction to something in the blood you were given. Every precaution is taken to prevent this. The decision  to have a blood transfusion has been considered carefully by your caregiver before blood is given. Blood is not given unless the benefits outweigh the risks. AFTER THE TRANSFUSION  Right after receiving a blood transfusion, you will usually feel much better and more energetic. This is especially true if your red blood cells have gotten low (anemic). The transfusion raises the level of the red blood cells which carry oxygen, and this usually causes an energy increase.  The nurse administering the transfusion will monitor you carefully for complications. HOME CARE INSTRUCTIONS  No special instructions are needed after a transfusion. You may find your energy is better. Speak with your caregiver about any limitations on  activity for underlying diseases you may have. SEEK MEDICAL CARE IF:   Your condition is not improving after your transfusion.  You develop redness or irritation at the intravenous (IV) site. SEEK IMMEDIATE MEDICAL CARE IF:  Any of the following symptoms occur over the next 12 hours:  Shaking chills.  You have a temperature by mouth above 102 F (38.9 C), not controlled by medicine.  Chest, back, or muscle pain.  People around you feel you are not acting correctly or are confused.  Shortness of breath or difficulty breathing.  Dizziness and fainting.  You get a rash or develop hives.  You have a decrease in urine output.  Your urine turns a dark color or changes to pink, red, or brown. Any of the following symptoms occur over the next 10 days:  You have a temperature by mouth above 102 F (38.9 C), not controlled by medicine.  Shortness of breath.  Weakness after normal activity.  The white part of the eye turns yellow (jaundice).  You have a decrease in the amount of urine or are urinating less often.  Your urine turns a dark color or changes to pink, red, or brown. Document Released: 03/24/2000 Document Revised: 06/19/2011 Document Reviewed: 11/11/2007 ExitCare Patient Information 2014 ExitCare, [place].  _______________________________________________________________________   CLEAR LIQUID DIET   Foods Allowed                                                                     Foods Excluded  Coffee and tea, regular and decaf                             liquids that you cannot  Plain Jell-O in any flavor                                             see through such as: Fruit ices (not with fruit pulp)                                     milk, soups, orange juice  Iced Popsicles                                    All solid food Carbonated beverages, regular  and diet                                    Cranberry, grape and [place] juices Sports drinks like  Gatorade Lightly seasoned clear broth or consume(fat free) Sugar, honey syrup  Sample Menu Breakfast                                Lunch                                     Supper Cranberry juice                    Beef broth                            Chicken broth Jell-O                                     Grape juice                           Ceja juice Coffee or tea                        Jell-O                                      Popsicle                                                Coffee or tea                        Coffee or tea  _____________________________________________________________________

## 2014-06-01 ENCOUNTER — Encounter (HOSPITAL_COMMUNITY): Payer: Self-pay

## 2014-06-01 ENCOUNTER — Encounter (HOSPITAL_COMMUNITY)
Admission: RE | Admit: 2014-06-01 | Discharge: 2014-06-01 | Disposition: A | Discharge status: Home / Self Care | Payer: Medicare Other | Source: Ambulatory Visit | Attending: Urology | Admitting: Urology

## 2014-06-01 DIAGNOSIS — Z01818 Encounter for other preprocedural examination: Secondary | ICD-10-CM | POA: Insufficient documentation

## 2014-06-01 DIAGNOSIS — C641 Malignant neoplasm of right kidney, except renal pelvis: Secondary | ICD-10-CM | POA: Diagnosis not present

## 2014-06-01 DIAGNOSIS — I259 Chronic ischemic heart disease, unspecified: Secondary | ICD-10-CM | POA: Diagnosis not present

## 2014-06-01 DIAGNOSIS — M6281 Muscle weakness (generalized): Secondary | ICD-10-CM | POA: Diagnosis not present

## 2014-06-01 LAB — CBC
HEMATOCRIT: 40.4 % (ref 39.0–52.0)
HEMOGLOBIN: 12.7 g/dL — AB (ref 13.0–17.0)
MCH: 28 pg (ref 26.0–34.0)
MCHC: 31.4 g/dL (ref 30.0–36.0)
MCV: 89.2 fL (ref 78.0–100.0)
PLATELETS: 225 10*3/uL (ref 150–400)
RBC: 4.53 MIL/uL (ref 4.22–5.81)
RDW: 15.1 % (ref 11.5–15.5)
WBC: 8.8 10*3/uL (ref 4.0–10.5)

## 2014-06-01 LAB — COMPREHENSIVE METABOLIC PANEL
ALT: 12 U/L (ref 0–53)
ANION GAP: 6 (ref 5–15)
AST: 18 U/L (ref 0–37)
Albumin: 3.7 g/dL (ref 3.5–5.2)
Alkaline Phosphatase: 87 U/L (ref 39–117)
BUN: 24 mg/dL — ABNORMAL HIGH (ref 6–23)
CALCIUM: 9.8 mg/dL (ref 8.4–10.5)
CHLORIDE: 103 mmol/L (ref 96–112)
CO2: 29 mmol/L (ref 19–32)
Creatinine, Ser: 1.26 mg/dL (ref 0.50–1.35)
GFR calc Af Amer: 61 mL/min — ABNORMAL LOW (ref >90)
GFR calc non Af Amer: 53 mL/min — ABNORMAL LOW (ref >90)
Glucose, Bld: 101 mg/dL — ABNORMAL HIGH (ref 70–99)
POTASSIUM: 4.4 mmol/L (ref 3.5–5.1)
Sodium: 138 mmol/L (ref 135–145)
TOTAL PROTEIN: 7.2 g/dL (ref 6.0–8.3)
Total Bilirubin: 0.8 mg/dL (ref 0.3–1.2)

## 2014-06-01 LAB — ABO/RH: ABO/RH(D): A NEG

## 2014-06-02 LAB — URINE CULTURE
Colony Count: NO GROWTH
Culture: NO GROWTH

## 2014-06-02 LAB — PSA: PSA: 2.6 ng/mL (ref <=4.00)

## 2014-06-04 NOTE — H&P (Signed)
Reason For Visit right upper tract TCC   History of Present Illness 40M referred by Dr. 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 Patient was initially diagnosed with bladder cancer in 2006 by Dr. Ranae Plumber in [place]. He has had two recurrences prior to this November 2015. His most recent CT scan showed a filling defect within the right renal pelvis as well as a on the lateral bladder wall. Ureteroscopy by Dr. Janice Norrie demonstrated a 2-3cm papillary lesion within the right renal pelvis which was biopsied and returned as low grade TCC. He simultaneous underwent a TURBT showing high grade non-muscle invasive TCC of the bladder.  The patient was also noted to have T1a CaP which was discovered during a TURP for bladder outlet obstuction. No path is available but this was beofre 2008. The patient tells me that his PSA was recently checked and was normal. He underwent CABG in Sept 2015. He also has adrenal insufficiency with associated orthostatic hypotension for whichh he takes Florinef. He also has some baseline vascular dementia.     History of umbilical hernia repair with mesh.   Past Medical History Problems  1. History of Anxiety (F41.9) 2. History of cardiac arrhythmia (Z86.79) 3. History of cardiac disorder (Z86.79) 4. History of depression (Z86.59) 5. History of heartburn (Z87.898) 6. History of hypothyroidism (Z86.39) 7. History of myocardial infarction (I25.2) 8. History of sleep apnea (Z87.09) 9. History of stroke (I95.18)  Surgical History Problems  1. History of Coronary Artery Single Venous Bypass Graft 2. History of Cystoscopy With Biopsy 3. History of Cystoscopy With Fulguration Medium Lesion (2-5cm) 4. History of Cystoscopy With Insertion Of Ureteral Stent Right 5. History of Cystoscopy With Resection Of Tumor 6. History of Cystoscopy With Ureteroscopy For Biopsy Right 7. History of Transurethral  Resection Of Prostate (TURP)  Current Meds 1. Aricept 10 MG Oral Tablet;  Therapy: (Recorded:23Nov2015) to Recorded 2. Aspirin 325 MG Oral Tablet;  Therapy: (Recorded:23Nov2015) to Recorded 3. Hydrocortisone Valerate 0.2 % External Cream;  Therapy: (Recorded:23Nov2015) to Recorded 4. Lexapro 20 MG Oral Tablet;  Therapy: (Recorded:23Nov2015) to Recorded 5. Midodrine HCl - 10 MG Oral Tablet;  Therapy: (Recorded:23Nov2015) to Recorded 6. MiraLax PACK;  Therapy: (Recorded:23Nov2015) to Recorded 7. Protonix 40 MG Oral Tablet Delayed Release;  Therapy: (Recorded:23Nov2015) to Recorded 8. Synthroid 125 MCG Oral Tablet;  Therapy: (Recorded:23Nov2015) to Recorded  Allergies Medication  1. No Known Drug Allergies  Family History Problems  1. Family history of cardiac disorder (Z82.49) : Mother 2. Family history of kidney stones (Z84.1) : Father 3. Family history of prostate cancer (Z80.42) : Father  Social History Problems  1. Denied: History of Alcohol use 2. Caffeine use (F15.90)   1 3. Mother alive and healthy   89 4. Never a smoker 5. Number of children   3 sons 8. Retired 88. Single  Review of Systems No progressive voiding symptoms, no back pain, no weight loss no fevers or chills. No ongoing gross hematuria   Vitals Vital Signs [Data Includes: Last 1 Day]  Recorded: 12Jan2016 11:03AM  Blood Pressure: 120 / 71 Temperature: 98.2 F Heart Rate: 70  Physical Exam Constitutional: Well nourished and well developed . No acute distress.  ENT:. The ears and nose are normal in appearance.  Pulmonary: No respiratory distress and normal respiratory rhythm and effort . Mild expiratory wheeze left greater than right.  Cardiovascular: Heart rate and rhythm are normal . The  arterial pulses are normal. No peripheral edema. Mild pedal edema  Abdomen: The abdomen is mildly obese, but not distended. The abdomen is soft and nontender. No masses are palpated. No CVA tenderness.  An  umbilical hernia is present, which is reducible. No hepatosplenomegaly noted.  Lymphatics: The femoral and inguinal nodes are not enlarged or tender.  Skin: Normal skin turgor, no visible rash and no visible skin lesions.  Neuro/Psych:. Mood and affect are appropriate.    Results/Data Urine [Data Includes: Last 1 Day]   [id number]  COLOR AMBER   APPEARANCE CLEAR   SPECIFIC GRAVITY 1.020   pH 6.5   GLUCOSE NEG mg/dL  BILIRUBIN NEG   KETONE NEG mg/dL  BLOOD LARGE   PROTEIN 30 mg/dL  UROBILINOGEN 0.2 mg/dL  NITRITE NEG   LEUKOCYTE ESTERASE MOD   SQUAMOUS EPITHELIAL/HPF NONE SEEN   WBC 7-10 WBC/hpf  RBC 21-50 RBC/hpf  BACTERIA FEW   CRYSTALS NONE SEEN   CASTS NONE SEEN    The patient's urinalysis demonstrates hematuria and pyuria, urine culture has been sent  I've independently reviewed the patient's CT scan from 03/13/14 as well as the retrograde pyelogram from 04/07/14. Both demonstrate a approximately 2-3 cm renal mass in the right interpolar region. The renal hilum appears to suggest 1 artery and 1 vein. There does not appear to be any hilar or retroperitoneal lymphadenopathy. In addition, the patient has a small fat containing ventral hernia.   Assessment Assessed  1. Prostate cancer (C61) 2. Transitional cell carcinoma of renal pelvis, unspecified laterality (C65.9) 3. Malignant neoplasm of lateral wall of bladder (C29.54)  81 year old male with fairly large low-grade transitional cell carcinoma of the right renal pelvis. Patient also has a high-grade non-muscle invasive bladder cancer recently treated with resection by Dr. Burnett Harry. Further, the patient has T1a or B low-grade prostate cancer diagnosed prior to 2008 and a TURP specimen. This has not been further worked up, treated, or followed.  The patient has cardiac and vascular comorbidities.   Plan Health Maintenance  1. UA With REFLEX; [Do Not Release]; Status:Complete;   Done: [id number] 10:45AM Malignant neoplasm of  lateral wall of bladder, Transitional cell carcinoma of renal pelvis, unspecified laterality  2. Follow-up Schedule Surgery Office  Follow-up  Status: Complete  Done: [id number]  Discussion/Summary I discussed the patient's clinical situation with him in detail. I outlined the treatment options for upper tract transitional cell carcinoma. Given the tumor burden I recommended the patient undergo a nephroureterectomy. We discussed doing this as a robotic-assisted procedure. The patient artery has a ureteral stent which will help during the dissection. I discussed the approach, the surgery in some detail and the postoperative recovery. The patient understands that he will have a Foley catheter in the postoperative period for 10 days following his surgery.  We will ask Dr. Sharilyn Sites for surgical clearance. Once we have established that he is optimized for this procedure we'll get it scheduled. We also will obtain a PSA to ensure that the patient's PSA is a reasonable level and that his prostate cancer has not progressed requiring additional treatment. I spent 30 minutes answering questions for the patient and his family.

## 2014-06-05 ENCOUNTER — Encounter (HOSPITAL_COMMUNITY): Payer: Self-pay | Admitting: *Deleted

## 2014-06-05 ENCOUNTER — Inpatient Hospital Stay (HOSPITAL_COMMUNITY): Payer: Medicare Other

## 2014-06-05 ENCOUNTER — Inpatient Hospital Stay (HOSPITAL_COMMUNITY): Payer: Medicare Other | Admitting: Certified Registered Nurse Anesthetist

## 2014-06-05 ENCOUNTER — Encounter (HOSPITAL_COMMUNITY)
Admission: RE | Disposition: A | Discharge status: Home Health | Payer: Self-pay | Source: Ambulatory Visit | Attending: Urology

## 2014-06-05 ENCOUNTER — Inpatient Hospital Stay (HOSPITAL_COMMUNITY)
Admission: RE | Admit: 2014-06-05 | Discharge: 2014-06-08 | DRG: 657 | Disposition: A | Discharge status: Home Health | Payer: Medicare Other | Source: Ambulatory Visit | Attending: Urology | Admitting: Urology

## 2014-06-05 DIAGNOSIS — K439 Ventral hernia without obstruction or gangrene: Secondary | ICD-10-CM | POA: Diagnosis not present

## 2014-06-05 DIAGNOSIS — E119 Type 2 diabetes mellitus without complications: Secondary | ICD-10-CM | POA: Diagnosis present

## 2014-06-05 DIAGNOSIS — Z01812 Encounter for preprocedural laboratory examination: Secondary | ICD-10-CM

## 2014-06-05 DIAGNOSIS — C641 Malignant neoplasm of right kidney, except renal pelvis: Secondary | ICD-10-CM

## 2014-06-05 DIAGNOSIS — K219 Gastro-esophageal reflux disease without esophagitis: Secondary | ICD-10-CM | POA: Diagnosis present

## 2014-06-05 DIAGNOSIS — I672 Cerebral atherosclerosis: Secondary | ICD-10-CM | POA: Diagnosis not present

## 2014-06-05 DIAGNOSIS — Z8249 Family history of ischemic heart disease and other diseases of the circulatory system: Secondary | ICD-10-CM

## 2014-06-05 DIAGNOSIS — Z7982 Long term (current) use of aspirin: Secondary | ICD-10-CM | POA: Diagnosis not present

## 2014-06-05 DIAGNOSIS — I252 Old myocardial infarction: Secondary | ICD-10-CM

## 2014-06-05 DIAGNOSIS — Z87442 Personal history of urinary calculi: Secondary | ICD-10-CM | POA: Diagnosis not present

## 2014-06-05 DIAGNOSIS — N183 Chronic kidney disease, stage 3 (moderate): Secondary | ICD-10-CM | POA: Diagnosis not present

## 2014-06-05 DIAGNOSIS — E2740 Unspecified adrenocortical insufficiency: Secondary | ICD-10-CM | POA: Diagnosis not present

## 2014-06-05 DIAGNOSIS — F419 Anxiety disorder, unspecified: Secondary | ICD-10-CM | POA: Diagnosis not present

## 2014-06-05 DIAGNOSIS — F0150 Vascular dementia without behavioral disturbance: Secondary | ICD-10-CM | POA: Diagnosis not present

## 2014-06-05 DIAGNOSIS — C651 Malignant neoplasm of right renal pelvis: Principal | ICD-10-CM | POA: Diagnosis present

## 2014-06-05 DIAGNOSIS — M1990 Unspecified osteoarthritis, unspecified site: Secondary | ICD-10-CM | POA: Diagnosis not present

## 2014-06-05 DIAGNOSIS — Z79899 Other long term (current) drug therapy: Secondary | ICD-10-CM | POA: Diagnosis not present

## 2014-06-05 DIAGNOSIS — E039 Hypothyroidism, unspecified: Secondary | ICD-10-CM | POA: Diagnosis present

## 2014-06-05 DIAGNOSIS — Z8673 Personal history of transient ischemic attack (TIA), and cerebral infarction without residual deficits: Secondary | ICD-10-CM | POA: Diagnosis not present

## 2014-06-05 DIAGNOSIS — Z951 Presence of aortocoronary bypass graft: Secondary | ICD-10-CM

## 2014-06-05 DIAGNOSIS — E785 Hyperlipidemia, unspecified: Secondary | ICD-10-CM | POA: Diagnosis present

## 2014-06-05 DIAGNOSIS — F329 Major depressive disorder, single episode, unspecified: Secondary | ICD-10-CM | POA: Diagnosis present

## 2014-06-05 DIAGNOSIS — I2510 Atherosclerotic heart disease of native coronary artery without angina pectoris: Secondary | ICD-10-CM | POA: Diagnosis not present

## 2014-06-05 DIAGNOSIS — Z9079 Acquired absence of other genital organ(s): Secondary | ICD-10-CM | POA: Diagnosis present

## 2014-06-05 DIAGNOSIS — Z79891 Long term (current) use of opiate analgesic: Secondary | ICD-10-CM

## 2014-06-05 DIAGNOSIS — C661 Malignant neoplasm of right ureter: Secondary | ICD-10-CM | POA: Diagnosis not present

## 2014-06-05 DIAGNOSIS — C649 Malignant neoplasm of unspecified kidney, except renal pelvis: Secondary | ICD-10-CM | POA: Diagnosis present

## 2014-06-05 DIAGNOSIS — Z8042 Family history of malignant neoplasm of prostate: Secondary | ICD-10-CM

## 2014-06-05 DIAGNOSIS — G4730 Sleep apnea, unspecified: Secondary | ICD-10-CM | POA: Diagnosis present

## 2014-06-05 DIAGNOSIS — C672 Malignant neoplasm of lateral wall of bladder: Secondary | ICD-10-CM | POA: Diagnosis not present

## 2014-06-05 DIAGNOSIS — C61 Malignant neoplasm of prostate: Secondary | ICD-10-CM | POA: Diagnosis present

## 2014-06-05 DIAGNOSIS — D495 Neoplasm of unspecified behavior of other genitourinary organs: Secondary | ICD-10-CM | POA: Diagnosis not present

## 2014-06-05 HISTORY — PX: CYSTOSCOPY W/ URETERAL STENT PLACEMENT: SHX1429

## 2014-06-05 HISTORY — PX: ROBOT ASSISTED LAPAROSCOPIC NEPHRECTOMY: SHX5140

## 2014-06-05 LAB — BASIC METABOLIC PANEL
ANION GAP: 9 (ref 5–15)
BUN: 18 mg/dL (ref 6–23)
CO2: 23 mmol/L (ref 19–32)
Calcium: 8.8 mg/dL (ref 8.4–10.5)
Chloride: 105 mmol/L (ref 96–112)
Creatinine, Ser: 1.49 mg/dL — ABNORMAL HIGH (ref 0.50–1.35)
GFR calc Af Amer: 50 mL/min — ABNORMAL LOW (ref >90)
GFR, EST NON AFRICAN AMERICAN: 43 mL/min — AB (ref >90)
Glucose, Bld: 180 mg/dL — ABNORMAL HIGH (ref 70–99)
POTASSIUM: 3.7 mmol/L (ref 3.5–5.1)
SODIUM: 137 mmol/L (ref 135–145)

## 2014-06-05 LAB — CBC
HCT: 38.3 % — ABNORMAL LOW (ref 39.0–52.0)
Hemoglobin: 12.4 g/dL — ABNORMAL LOW (ref 13.0–17.0)
MCH: 28.5 pg (ref 26.0–34.0)
MCHC: 32.4 g/dL (ref 30.0–36.0)
MCV: 88 fL (ref 78.0–100.0)
PLATELETS: 214 10*3/uL (ref 150–400)
RBC: 4.35 MIL/uL (ref 4.22–5.81)
RDW: 14.8 % (ref 11.5–15.5)
WBC: 13.2 10*3/uL — AB (ref 4.0–10.5)

## 2014-06-05 LAB — TYPE AND SCREEN
ABO/RH(D): A NEG
Antibody Screen: NEGATIVE

## 2014-06-05 SURGERY — ROBOTIC ASSISTED LAPAROSCOPIC NEPHRECTOMY
Anesthesia: General | Laterality: Right

## 2014-06-05 MED ORDER — CISATRACURIUM BESYLATE 20 MG/10ML IV SOLN
INTRAVENOUS | Status: AC
  Filled 2014-06-05: qty 10

## 2014-06-05 MED ORDER — HYDROMORPHONE HCL 2 MG/ML IJ SOLN
INTRAMUSCULAR | Status: AC
Start: 2014-06-05 — End: 2014-06-05
  Filled 2014-06-05: qty 1

## 2014-06-05 MED ORDER — HEPARIN SODIUM (PORCINE) 5000 UNIT/ML IJ SOLN
5000.0000 [IU] | Freq: Three times a day (TID) | INTRAMUSCULAR | Status: DC
  Administered 2014-06-06 – 2014-06-08 (×7): 5000 [IU] via SUBCUTANEOUS
  Filled 2014-06-05 (×8): qty 1

## 2014-06-05 MED ORDER — CEFAZOLIN SODIUM-DEXTROSE 2-3 GM-% IV SOLR
2.0000 g | INTRAVENOUS | Status: AC
  Administered 2014-06-05: 2 g via INTRAVENOUS

## 2014-06-05 MED ORDER — PROPOFOL 10 MG/ML IV BOLUS
INTRAVENOUS | Status: DC | PRN
  Administered 2014-06-05: 180 mg via INTRAVENOUS

## 2014-06-05 MED ORDER — CIPROFLOXACIN IN D5W 400 MG/200ML IV SOLN
400.0000 mg | INTRAVENOUS | Status: AC
  Administered 2014-06-05: 400 mg via INTRAVENOUS

## 2014-06-05 MED ORDER — FENTANYL CITRATE 0.05 MG/ML IJ SOLN
INTRAMUSCULAR | Status: DC | PRN
  Administered 2014-06-05: 50 ug via INTRAVENOUS
  Administered 2014-06-05 (×8): 25 ug via INTRAVENOUS

## 2014-06-05 MED ORDER — HYDROMORPHONE HCL 1 MG/ML IJ SOLN
0.2500 mg | INTRAMUSCULAR | Status: DC | PRN
  Administered 2014-06-05 (×2): 0.5 mg via INTRAVENOUS

## 2014-06-05 MED ORDER — MIDODRINE HCL 5 MG PO TABS
10.0000 mg | ORAL_TABLET | Freq: Two times a day (BID) | ORAL | Status: DC
Start: 2014-06-06 — End: 2014-06-08
  Administered 2014-06-06 – 2014-06-08 (×6): 10 mg via ORAL
  Filled 2014-06-05 (×6): qty 2

## 2014-06-05 MED ORDER — LIDOCAINE HCL (CARDIAC) 20 MG/ML IV SOLN
INTRAVENOUS | Status: DC | PRN
  Administered 2014-06-05: 100 mg via INTRAVENOUS

## 2014-06-05 MED ORDER — DEXAMETHASONE SODIUM PHOSPHATE 10 MG/ML IJ SOLN
INTRAMUSCULAR | Status: AC
  Filled 2014-06-05: qty 1

## 2014-06-05 MED ORDER — CISATRACURIUM BESYLATE (PF) 10 MG/5ML IV SOLN
INTRAVENOUS | Status: DC | PRN
  Administered 2014-06-05: 8 mg via INTRAVENOUS
  Administered 2014-06-05 (×3): 4 mg via INTRAVENOUS
  Administered 2014-06-05: 2 mg via INTRAVENOUS
  Administered 2014-06-05 (×4): 4 mg via INTRAVENOUS

## 2014-06-05 MED ORDER — OXYCODONE HCL 5 MG/5ML PO SOLN
5.0000 mg | Freq: Once | ORAL | Status: DC | PRN
  Filled 2014-06-05: qty 5

## 2014-06-05 MED ORDER — DOCUSATE SODIUM 100 MG PO CAPS
100.0000 mg | ORAL_CAPSULE | Freq: Two times a day (BID) | ORAL | Status: DC
  Administered 2014-06-05 – 2014-06-08 (×6): 100 mg via ORAL
  Filled 2014-06-05 (×7): qty 1

## 2014-06-05 MED ORDER — BELLADONNA ALKALOIDS-OPIUM 16.2-60 MG RE SUPP
1.0000 | Freq: Three times a day (TID) | RECTAL | Status: DC | PRN
  Administered 2014-06-07: 1 via RECTAL
  Filled 2014-06-05: qty 1

## 2014-06-05 MED ORDER — ONDANSETRON HCL 4 MG/2ML IJ SOLN: 4.0000 mg | Freq: Four times a day (QID) | INTRAMUSCULAR | Status: DC | PRN

## 2014-06-05 MED ORDER — BUPIVACAINE-EPINEPHRINE 0.25% -1:200000 IJ SOLN
INTRAMUSCULAR | Status: DC | PRN
  Administered 2014-06-05: 15 mL

## 2014-06-05 MED ORDER — BUPIVACAINE-EPINEPHRINE (PF) 0.25% -1:200000 IJ SOLN
INTRAMUSCULAR | Status: AC
  Filled 2014-06-05: qty 30

## 2014-06-05 MED ORDER — SODIUM CHLORIDE 0.9 % IJ SOLN
INTRAMUSCULAR | Status: AC
  Filled 2014-06-05: qty 10

## 2014-06-05 MED ORDER — BISACODYL 10 MG RE SUPP: 10.0000 mg | Freq: Every day | RECTAL | Status: DC | PRN

## 2014-06-05 MED ORDER — HYDROMORPHONE HCL 1 MG/ML IJ SOLN
INTRAMUSCULAR | Status: AC
  Administered 2014-06-05: 0.5 mg via INTRAVENOUS
  Filled 2014-06-05: qty 1

## 2014-06-05 MED ORDER — LACTATED RINGERS IR SOLN
Status: DC | PRN
  Administered 2014-06-05: 1000 mL

## 2014-06-05 MED ORDER — CEFAZOLIN SODIUM-DEXTROSE 2-3 GM-% IV SOLR
2.0000 g | Freq: Three times a day (TID) | INTRAVENOUS | Status: AC
Start: 2014-06-05 — End: 2014-06-06
  Administered 2014-06-05 – 2014-06-06 (×2): 2 g via INTRAVENOUS
  Filled 2014-06-05 (×2): qty 50

## 2014-06-05 MED ORDER — HYDROMORPHONE HCL 1 MG/ML IJ SOLN
INTRAMUSCULAR | Status: DC | PRN
  Administered 2014-06-05 (×6): .2 mg via INTRAVENOUS
  Administered 2014-06-05: .4 mg via INTRAVENOUS

## 2014-06-05 MED ORDER — PANTOPRAZOLE SODIUM 40 MG IV SOLR
40.0000 mg | Freq: Every day | INTRAVENOUS | Status: DC
  Administered 2014-06-05 – 2014-06-07 (×3): 40 mg via INTRAVENOUS
  Filled 2014-06-05 (×3): qty 40

## 2014-06-05 MED ORDER — FENTANYL CITRATE 0.05 MG/ML IJ SOLN
INTRAMUSCULAR | Status: AC
  Filled 2014-06-05: qty 5

## 2014-06-05 MED ORDER — LEVOTHYROXINE SODIUM 125 MCG PO TABS
125.0000 ug | ORAL_TABLET | Freq: Every day | ORAL | Status: DC
  Administered 2014-06-06 – 2014-06-08 (×3): 125 ug via ORAL
  Filled 2014-06-05 (×3): qty 1

## 2014-06-05 MED ORDER — SODIUM CHLORIDE 0.9 % IJ SOLN
INTRAMUSCULAR | Status: AC
  Filled 2014-06-05: qty 50

## 2014-06-05 MED ORDER — ONDANSETRON HCL 4 MG/2ML IJ SOLN
INTRAMUSCULAR | Status: AC
  Filled 2014-06-05: qty 2

## 2014-06-05 MED ORDER — SODIUM CHLORIDE 0.9 % IJ SOLN
INTRAMUSCULAR | Status: AC
  Filled 2014-06-05: qty 20

## 2014-06-05 MED ORDER — NEOSTIGMINE METHYLSULFATE 10 MG/10ML IV SOLN
INTRAVENOUS | Status: DC | PRN
  Administered 2014-06-05: 5 mg via INTRAVENOUS

## 2014-06-05 MED ORDER — PHENYLEPHRINE HCL 10 MG/ML IJ SOLN
INTRAMUSCULAR | Status: AC
  Filled 2014-06-05: qty 1

## 2014-06-05 MED ORDER — EPHEDRINE SULFATE 50 MG/ML IJ SOLN
INTRAMUSCULAR | Status: DC | PRN
  Administered 2014-06-05 (×3): 5 mg via INTRAVENOUS
  Administered 2014-06-05: 10 mg via INTRAVENOUS

## 2014-06-05 MED ORDER — DONEPEZIL HCL 10 MG PO TABS
10.0000 mg | ORAL_TABLET | Freq: Every day | ORAL | Status: DC
  Administered 2014-06-05 – 2014-06-07 (×3): 10 mg via ORAL
  Filled 2014-06-05 (×3): qty 1

## 2014-06-05 MED ORDER — FLUDROCORTISONE ACETATE 0.1 MG PO TABS
0.2000 mg | ORAL_TABLET | Freq: Every day | ORAL | Status: DC
  Administered 2014-06-05 – 2014-06-08 (×4): 0.2 mg via ORAL
  Filled 2014-06-05 (×4): qty 2

## 2014-06-05 MED ORDER — OXYCODONE HCL 5 MG PO TABS
5.0000 mg | ORAL_TABLET | ORAL | Status: DC | PRN
  Administered 2014-06-07 (×3): 10 mg via ORAL
  Administered 2014-06-08: 5 mg via ORAL
  Filled 2014-06-05: qty 1
  Filled 2014-06-05 (×3): qty 2

## 2014-06-05 MED ORDER — DEXAMETHASONE SODIUM PHOSPHATE 10 MG/ML IJ SOLN
INTRAMUSCULAR | Status: DC | PRN
  Administered 2014-06-05: 10 mg via INTRAVENOUS

## 2014-06-05 MED ORDER — PROMETHAZINE HCL 25 MG/ML IJ SOLN: 6.2500 mg | INTRAMUSCULAR | Status: DC | PRN

## 2014-06-05 MED ORDER — LIDOCAINE HCL (CARDIAC) 20 MG/ML IV SOLN
INTRAVENOUS | Status: AC
  Filled 2014-06-05: qty 5

## 2014-06-05 MED ORDER — MORPHINE SULFATE 2 MG/ML IJ SOLN
2.0000 mg | INTRAMUSCULAR | Status: DC | PRN
  Administered 2014-06-05 – 2014-06-06 (×5): 2 mg via INTRAVENOUS
  Filled 2014-06-05 (×6): qty 1

## 2014-06-05 MED ORDER — ESCITALOPRAM OXALATE 10 MG PO TABS
10.0000 mg | ORAL_TABLET | Freq: Every morning | ORAL | Status: DC
  Administered 2014-06-06 – 2014-06-08 (×3): 10 mg via ORAL
  Filled 2014-06-05 (×3): qty 1

## 2014-06-05 MED ORDER — OXYCODONE HCL 5 MG PO TABS: 5.0000 mg | ORAL_TABLET | Freq: Once | ORAL | Status: DC | PRN

## 2014-06-05 MED ORDER — LACTATED RINGERS IV SOLN
INTRAVENOUS | Status: DC
  Administered 2014-06-05: 20:00:00 via INTRAVENOUS
  Administered 2014-06-06: 125 mL/h via INTRAVENOUS
  Administered 2014-06-07: 03:00:00 via INTRAVENOUS

## 2014-06-05 MED ORDER — LACTATED RINGERS IV SOLN
INTRAVENOUS | Status: DC | PRN
  Administered 2014-06-05: 13:00:00 via INTRAVENOUS

## 2014-06-05 MED ORDER — SODIUM CHLORIDE 0.9 % IJ SOLN
INTRAMUSCULAR | Status: DC | PRN
  Administered 2014-06-05: 20 mL

## 2014-06-05 MED ORDER — BUPIVACAINE LIPOSOME 1.3 % IJ SUSP
20.0000 mL | Freq: Once | INTRAMUSCULAR | Status: AC
  Administered 2014-06-05: 20 mL
  Filled 2014-06-05 (×2): qty 20

## 2014-06-05 MED ORDER — SUCCINYLCHOLINE CHLORIDE 20 MG/ML IJ SOLN
INTRAMUSCULAR | Status: DC | PRN
  Administered 2014-06-05: 100 mg via INTRAVENOUS

## 2014-06-05 MED ORDER — GLYCOPYRROLATE 0.2 MG/ML IJ SOLN
INTRAMUSCULAR | Status: DC | PRN
  Administered 2014-06-05: 0.2 mg via INTRAVENOUS
  Administered 2014-06-05: 0.6 mg via INTRAVENOUS

## 2014-06-05 MED ORDER — SODIUM CHLORIDE 0.9 % IR SOLN
Status: DC | PRN
  Administered 2014-06-05: 3000 mL via INTRAVESICAL

## 2014-06-05 MED ORDER — CIPROFLOXACIN IN D5W 400 MG/200ML IV SOLN
INTRAVENOUS | Status: AC
  Filled 2014-06-05: qty 200

## 2014-06-05 MED ORDER — LACTATED RINGERS IV SOLN
INTRAVENOUS | Status: DC | PRN
  Administered 2014-06-05 (×3): via INTRAVENOUS

## 2014-06-05 MED ORDER — SODIUM CHLORIDE 0.9 % IV SOLN
10.0000 mg | INTRAVENOUS | Status: DC | PRN
  Administered 2014-06-05: 10 ug/min via INTRAVENOUS

## 2014-06-05 MED ORDER — CEFAZOLIN SODIUM-DEXTROSE 2-3 GM-% IV SOLR
INTRAVENOUS | Status: AC
  Filled 2014-06-05: qty 50

## 2014-06-05 MED ORDER — PROPOFOL 10 MG/ML IV BOLUS
INTRAVENOUS | Status: AC
  Filled 2014-06-05: qty 20

## 2014-06-05 MED ORDER — ONDANSETRON HCL 4 MG PO TABS: 4.0000 mg | ORAL_TABLET | Freq: Three times a day (TID) | ORAL | Status: DC | PRN

## 2014-06-05 MED ORDER — ONDANSETRON HCL 4 MG/2ML IJ SOLN
INTRAMUSCULAR | Status: DC | PRN
  Administered 2014-06-05: 4 mg via INTRAVENOUS

## 2014-06-05 MED ORDER — GLYCOPYRROLATE 0.2 MG/ML IJ SOLN
INTRAMUSCULAR | Status: AC
  Filled 2014-06-05: qty 3

## 2014-06-05 MED ORDER — POLYETHYLENE GLYCOL 3350 17 G PO PACK
17.0000 g | PACK | Freq: Every day | ORAL | Status: DC
  Administered 2014-06-05 – 2014-06-07 (×3): 17 g via ORAL
  Filled 2014-06-05 (×3): qty 1

## 2014-06-05 MED ORDER — ACETAMINOPHEN 10 MG/ML IV SOLN
1000.0000 mg | Freq: Four times a day (QID) | INTRAVENOUS | Status: AC
  Administered 2014-06-05 – 2014-06-06 (×4): 1000 mg via INTRAVENOUS
  Filled 2014-06-05 (×7): qty 100

## 2014-06-05 SURGICAL SUPPLY — 86 items
APL ESCP 34 STRL LF DISP (HEMOSTASIS) ×1
APPLICATOR SURGIFLO ENDO (HEMOSTASIS) ×2 IMPLANT
BAG URO CATCHER STRL LF (DRAPE) ×3 IMPLANT
BASKET LASER NITINOL 1.9FR (BASKET) IMPLANT
BASKET ZERO TIP NITINOL 2.4FR (BASKET) IMPLANT
BSKT STON RTRVL 120 1.9FR (BASKET)
BSKT STON RTRVL ZERO TP 2.4FR (BASKET)
CABLE HIGH FREQUENCY MONO STRZ (ELECTRODE) ×3 IMPLANT
CATH FOLEY 3WAY  5CC 18FR (CATHETERS) ×2
CATH FOLEY 3WAY 5CC 18FR (CATHETERS) IMPLANT
CATH URET 5FR 28IN OPEN ENDED (CATHETERS) ×3 IMPLANT
CHLORAPREP W/TINT 26ML (MISCELLANEOUS) ×3 IMPLANT
CLIP LIGATING HEM O LOK PURPLE (MISCELLANEOUS) ×2 IMPLANT
CLIP LIGATING HEMO LOK XL GOLD (MISCELLANEOUS) ×2 IMPLANT
CLIP LIGATING HEMO O LOK GREEN (MISCELLANEOUS) ×2 IMPLANT
CLOTH BEACON ORANGE TIMEOUT ST (SAFETY) ×3 IMPLANT
CORDS BIPOLAR (ELECTRODE) ×3 IMPLANT
COVER SURGICAL LIGHT HANDLE (MISCELLANEOUS) ×3 IMPLANT
COVER TIP SHEARS 8 DVNC (MISCELLANEOUS) ×1 IMPLANT
COVER TIP SHEARS 8MM DA VINCI (MISCELLANEOUS) ×2
CUTTER ECHEON FLEX ENDO 45 340 (ENDOMECHANICALS) ×2 IMPLANT
DECANTER SPIKE VIAL GLASS SM (MISCELLANEOUS) ×3 IMPLANT
DRAIN CHANNEL 15F RND FF 3/16 (WOUND CARE) ×2 IMPLANT
DRAPE INCISE IOBAN 66X45 STRL (DRAPES) ×3 IMPLANT
DRAPE LAPAROSCOPIC ABDOMINAL (DRAPES) ×3 IMPLANT
DRAPE SHEET LG 3/4 BI-LAMINATE (DRAPES) ×3 IMPLANT
DRAPE TABLE BACK 44X90 PK DISP (DRAPES) ×3 IMPLANT
DRAPE WARM FLUID 44X44 (DRAPE) ×3 IMPLANT
DRSG TELFA 3X8 NADH (GAUZE/BANDAGES/DRESSINGS) ×6 IMPLANT
ELECT REM PT RETURN 9FT ADLT (ELECTROSURGICAL) ×3
ELECTRODE REM PT RTRN 9FT ADLT (ELECTROSURGICAL) ×1 IMPLANT
EVACUATOR SILICONE 100CC (DRAIN) ×2 IMPLANT
GLOVE BIOGEL M STRL SZ7.5 (GLOVE) ×18 IMPLANT
GLOVE BIOGEL PI IND STRL 7.5 (GLOVE) ×1 IMPLANT
GLOVE BIOGEL PI INDICATOR 7.5 (GLOVE) ×2
GOWN STRL REUS W/TWL LRG LVL3 (GOWN DISPOSABLE) ×14 IMPLANT
GOWN STRL REUS W/TWL XL LVL3 (GOWN DISPOSABLE) ×3 IMPLANT
GUIDEWIRE ANG ZIPWIRE 038X150 (WIRE) IMPLANT
GUIDEWIRE STR DUAL SENSOR (WIRE) ×3 IMPLANT
HEMOSTAT SURGICEL 4X8 (HEMOSTASIS) IMPLANT
KIT ACCESSORY DA VINCI DISP (KITS) ×2
KIT ACCESSORY DVNC DISP (KITS) ×1 IMPLANT
KIT BASIN OR (CUSTOM PROCEDURE TRAY) ×3 IMPLANT
LIQUID BAND (GAUZE/BANDAGES/DRESSINGS) ×2 IMPLANT
NDL INSUFFLATION 14GA 120MM (NEEDLE) IMPLANT
NEEDLE INSUFFLATION 14GA 120MM (NEEDLE) ×3 IMPLANT
NS IRRIG 1000ML POUR BTL (IV SOLUTION) ×3 IMPLANT
PACK CYSTO (CUSTOM PROCEDURE TRAY) ×3 IMPLANT
PAD DRESSING TELFA 3X8 NADH (GAUZE/BANDAGES/DRESSINGS) IMPLANT
PAD POSITIONING PINK XL (MISCELLANEOUS) IMPLANT
PENCIL BUTTON HOLSTER BLD 10FT (ELECTRODE) ×3 IMPLANT
POSITIONER SURGICAL ARM (MISCELLANEOUS) ×4 IMPLANT
POUCH ENDO CATCH II 15MM (MISCELLANEOUS) ×3 IMPLANT
RELOAD WH ECHELON 45 (STAPLE) ×8 IMPLANT
SET TUBE IRRIG SUCTION NO TIP (IRRIGATION / IRRIGATOR) ×2 IMPLANT
SHEET LAVH (DRAPES) IMPLANT
SLEEVE XCEL OPT CAN 5 100 (ENDOMECHANICALS) ×2 IMPLANT
SOLUTION ANTI FOG 6CC (MISCELLANEOUS) ×4 IMPLANT
SOLUTION ELECTROLUBE (MISCELLANEOUS) ×3 IMPLANT
SPONGE LAP 18X18 X RAY DECT (DISPOSABLE) ×3 IMPLANT
STAPLER VISISTAT 35W (STAPLE) ×2 IMPLANT
SURGIFLO W/THROMBIN 8M KIT (HEMOSTASIS) ×2 IMPLANT
SUT ETHILON 3 0 PS 1 (SUTURE) ×2 IMPLANT
SUT MNCRL AB 4-0 PS2 18 (SUTURE) ×6 IMPLANT
SUT MON AB 2-0 SH 27 (SUTURE) ×1 IMPLANT
SUT PDS AB 0 CTX 60 (SUTURE) ×1 IMPLANT
SUT PROLENE 0 CT 1 CR/8 (SUTURE) ×2 IMPLANT
SUT VIC AB 0 CT1 27 (SUTURE) ×3
SUT VIC AB 0 CT1 27XBRD ANTBC (SUTURE) ×1 IMPLANT
SUT VIC AB 2-0 SH 27 (SUTURE) ×3
SUT VIC AB 2-0 SH 27X BRD (SUTURE) IMPLANT
SUT VICRYL 0 UR6 27IN ABS (SUTURE) ×2 IMPLANT
SUT VLOC BARB 180 ABS3/0GR12 (SUTURE) ×9
SUTURE VLOC BRB 180 ABS3/0GR12 (SUTURE) IMPLANT
TAPE CLOTH SURG 4X10 WHT LF (GAUZE/BANDAGES/DRESSINGS) ×2 IMPLANT
TOWEL OR 17X26 10 PK STRL BLUE (TOWEL DISPOSABLE) ×4 IMPLANT
TOWEL OR NON WOVEN STRL DISP B (DISPOSABLE) ×3 IMPLANT
TRAY FOLEY CATH 14FRSI W/METER (CATHETERS) ×3 IMPLANT
TRAY LAPAROSCOPIC (CUSTOM PROCEDURE TRAY) ×3 IMPLANT
TROCAR 12M 150ML BLUNT (TROCAR) ×3 IMPLANT
TROCAR XCEL 12X100 BLDLESS (ENDOMECHANICALS) ×3 IMPLANT
TUBE FEEDING 8FR 16IN STR KANG (MISCELLANEOUS) IMPLANT
TUBING CONNECTING 10 (TUBING) ×2 IMPLANT
TUBING CONNECTING 10' (TUBING) ×1
TUBING INSUFFLATION 10FT LAP (TUBING) ×3 IMPLANT
WATER STERILE IRR 1500ML POUR (IV SOLUTION) ×6 IMPLANT

## 2014-06-05 NOTE — Discharge Instructions (Signed)
Activity:  You are encouraged to ambulate frequently (about every hour during waking hours) to help prevent blood clots from forming in your legs or lungs.  However, you should not engage in any heavy lifting (> 10-15 lbs), strenuous activity, or straining.  Diet: You should advance your diet as instructed by your physician.  It will be normal to have some bloating, nausea, and abdominal discomfort intermittently.  Prescriptions:  You will be provided a prescription for pain medication to take as needed.  If your pain is not severe enough to require the prescription pain medication, you may take extra strength Tylenol instead which will have less side effects.  You should also take a prescribed stool softener to avoid straining with bowel movements as the prescription pain medication may constipate you.  Incisions: You may remove your dressing bandages 48 hours after surgery if not removed in the hospital.  You will either have some small staples or special tissue glue at each of the incision sites. Once the bandages are removed (if present), the incisions may stay open to air.  You may start showering (but not soaking or bathing in water) the 2nd day after surgery and the incisions simply need to be patted dry after the shower.  No additional care is needed.  What to call us about: You should call the office ([phone]) if you develop fever > 101 or develop persistent vomiting. Activity:  You are encouraged to ambulate frequently (about every hour during waking hours) to help prevent blood clots from forming in your legs or lungs.  However, you should not engage in any heavy lifting (> 10-15 lbs), strenuous activity, or straining.    Foley Catheter Care A soft, flexible tube (Foley catheter) may have been placed in your bladder to drain urine and fluid. Follow these instructions: Taking Care of the Catheter Keep the area where the catheter leaves your body clean.  Attach the catheter to the leg so  there is no tension on the catheter.  Keep the drainage bag below the level of the bladder, but keep it OFF the floor.  Do not take long soaking baths. Your caregiver will give instructions about showering.  Wash your hands before touching ANYTHING related to the catheter or bag.  Using mild soap and warm water on a washcloth:  Clean the area closest to the catheter insertion site using a circular motion around the catheter.  Clean the catheter itself by wiping AWAY from the insertion site for several inches down the tube.  NEVER wipe upward as this could sweep bacteria up into the urethra (tube in your body that normally drains the bladder) and cause infection.  Place a small amount of sterile lubricant at the tip of the penis where the catheter is entering.  Taking Care of the Drainage Bags Two drainage bags may be taken home: a large overnight drainage bag, and a smaller leg bag which fits underneath clothing.  It is okay to wear the overnight bag at any time, but NEVER wear the smaller leg bag at night.  Keep the drainage bag well below the level of your bladder. This prevents backflow of urine into the bladder and allows the urine to drain freely.  Anchor the tubing to your leg to prevent pulling or tension on the catheter. Use tape or a leg strap provided by the hospital.  Empty the drainage bag when it is 1/2 to 3/4 full. Wash your hands before and after touching the bag.  Periodically   check the tubing for kinks to make sure there is no pressure on the tubing which could restrict the flow of urine.  Changing the Drainage Bags Cleanse both ends of the clean bag with alcohol before changing.  Pinch off the rubber catheter to avoid urine spillage during the disconnection.  Disconnect the dirty bag and connect the clean one.  Empty the dirty bag carefully to avoid a urine spill.  Attach the new bag to the leg with tape or a leg strap.  Cleaning the Drainage Bags Whenever a drainage bag is  disconnected, it must be cleaned quickly so it is ready for the next use.  Wash the bag in warm, soapy water.  Rinse the bag thoroughly with warm water.  Soak the bag for 30 minutes in a solution of white vinegar and water (1 cup vinegar to 1 quart warm water).  Rinse with warm water.  SEEK MEDICAL CARE IF:  You have chills or night sweats.  You are leaking around your catheter or have problems with your catheter. It is not uncommon to have sporadic leakage around your catheter as a result of bladder spasms. If the leakage stops, there is not much need for concern. If you are uncertain, call your caregiver.  You develop side effects that you think are coming from your medicines.  SEEK IMMEDIATE MEDICAL CARE IF:  You are suddenly unable to urinate. Check to see if there are any kinks in the drainage tubing that may cause this. If you cannot find any kinks, call your caregiver immediately. This is an emergency.  You develop shortness of breath or chest pains.  Bleeding persists or clots develop in your urine.  You have a fever.  You develop pain in your back or over your lower belly (abdomen).  You develop pain or swelling in your legs.  Any problems you are having get worse rather than better.  MAKE SURE YOU:  Understand these instructions.  Will watch your condition.  Will get help right away if you are not doing well or get worse.   

## 2014-06-05 NOTE — Anesthesia Preprocedure Evaluation (Addendum)
Anesthesia Evaluation    Reviewed: Allergy & Precautions, NPO status , Patient's Chart, lab work & pertinent test results  History of Anesthesia Complications (+) PROLONGED EMERGENCE and history of anesthetic complications  Airway        Dental   Pulmonary sleep apnea , former smoker,          Cardiovascular + CAD, + Past MI and + CABG  Study Conclusions  - Left ventricle: The cavity size was normal. Wall thickness was normal. Systolic function was normal. The estimated ejection fraction was in the range of 55% to 60%. Wall motion was normal   Neuro/Psych Dimentia TIAnegative psych ROS   GI/Hepatic hiatal hernia, GERD-  ,  Endo/Other  diabetesHypothyroidism   Renal/GU CRFRenal disease     Musculoskeletal   Abdominal   Peds  Hematology   Anesthesia Other Findings   Reproductive/Obstetrics                          Anesthesia Physical Anesthesia Plan  ASA: III  Anesthesia Plan: General   Post-op Pain Management:    Induction: Intravenous  Airway Management Planned: Oral ETT  Additional Equipment: Arterial line  Intra-op Plan:   Post-operative Plan: Extubation in OR  Informed Consent: I have reviewed the patients History and Physical, chart, labs and discussed the procedure including the risks, benefits and alternatives for the proposed anesthesia with the patient or authorized representative who has indicated his/her understanding and acceptance.   Dental advisory given  Plan Discussed with: CRNA, Anesthesiologist and Surgeon  Anesthesia Plan Comments:        Anesthesia Quick Evaluation

## 2014-06-05 NOTE — Transfer of Care (Signed)
Immediate Anesthesia Transfer of Care Note  Patient: [name] [name]  Procedure(s) Performed: Procedure(s): ROBOTIC ASSISTED LAPAROSCOPIC RIGHT NEPHROURETERECTOMY, VENTRAL HERNIA REPAIR  (Right) CYSTOSCOPY  (Right)  Patient Location: PACU  Anesthesia Type:General  Level of Consciousness: awake and alert   Airway & Oxygen Therapy: Patient Spontanous Breathing and Patient connected to face mask oxygen  Post-op Assessment: Report given to RN and Post -op Vital signs reviewed and stable  Post vital signs: Reviewed and stable  Last Vitals:  Filed Vitals:   06/05/14 1100  BP: 134/66  Pulse: 60  Temp: 36.6 C  Resp: 16    Complications: No apparent anesthesia complications

## 2014-06-05 NOTE — Anesthesia Postprocedure Evaluation (Signed)
Anesthesia Post Note  Patient: [name] [name]  Procedure(s) Performed: Procedure(s) (LRB): ROBOTIC ASSISTED LAPAROSCOPIC RIGHT NEPHROURETERECTOMY, VENTRAL HERNIA REPAIR  (Right) CYSTOSCOPY  (Right)  Anesthesia type: general  Patient location: PACU  Post pain: Pain level controlled  Post assessment: Patient's Cardiovascular Status Stable  Last Vitals:  Filed Vitals:   06/05/14 1845  BP:   Pulse: 80  Temp:   Resp: 16    Post vital signs: Reviewed and stable  Level of consciousness: sedated  Complications: No apparent anesthesia complications

## 2014-06-05 NOTE — Anesthesia Procedure Notes (Signed)
Procedure Name: Intubation Date/Time: 06/05/2014 1:02 PM Performed by: Maxwell Caul Pre-anesthesia Checklist: Patient identified, Emergency Drugs available, Suction available and Patient being monitored Patient Re-evaluated:Patient Re-evaluated prior to inductionOxygen Delivery Method: Circle System Utilized Preoxygenation: Pre-oxygenation with 100% oxygen Intubation Type: IV induction Ventilation: Mask ventilation without difficulty Laryngoscope Size: Mac and 4 Grade View: Grade I Tube type: Oral Number of attempts: 1 Airway Equipment and Method: Stylet and Oral airway Placement Confirmation: ETT inserted through vocal cords under direct vision,  positive ETCO2 and breath sounds checked- equal and bilateral Secured at: 21 cm Tube secured with: Tape Dental Injury: Teeth and Oropharynx as per pre-operative assessment

## 2014-06-05 NOTE — Op Note (Addendum)
Preoperative diagnosis:  1. Right upper tract low-grade TCC   Postoperative diagnosis:  1. Same   Procedure: 1. Cystoscopy 2. Robotic-assisted laparoscopic nephroureterectomy 3. Ventral hernia repair   Surgeon: Ardis Hughs, MD First assistant: Dr. Alexis Frock, M.D.  Anesthesia: General  Complications: None  Intraoperative findings: Cystoscopy was performed to confirm position of the patient's right ureteral stent. There are no tumors within the bladder. There was an extensive amount of retroperitoneal fat which was included with the specimen. A large bladder cuff was excised and a watertight closure noted on the bladder.  EBL: 150 mL   Specimens: Right kidney and ureter with bladder cuff.  Indication: [name] [name] is a 81 y.o. patient with gross hematuria. He was found to have high-grade bladder cancer, non-muscle invasive, as well as a low-grade upper tract tumor in the right renal pelvis.  After reviewing the management options for treatment, he elected to proceed with the above surgical procedure(s). We have discussed the potential benefits and risks of the procedure, side effects of the proposed treatment, the likelihood of the patient achieving the goals of the procedure, and any potential problems that might occur during the procedure or recuperation. Informed consent has been obtained.  Description of procedure:  The patient was taken to the operating room and general anesthesia was induced.  The patient was placed in the dorsal lithotomy position, prepped and draped in the usual sterile fashion, and preoperative antibiotics were administered. A preoperative time-out was performed.   I then passed a 30 2 2  [place] rigid scope into the patient's urethra and into the bladder. A 3-60 cystoscopic evaluation was performed with the above findings. The stent was noted to be in good position and emanating from the right ureteral orifice. At this point a 17 [place] 3-way  Foley catheter was passed into the patient's bladder.  The patient was subsequently taken from dorsal lithotomy position and placed in the left lateral decubitus position, left side down. He was then secured to the table with a beanbag, the table was maximally flexed at the kidney, and the patient was secured to the table with 4 x 4 tape and straps. All bony prominences were padded. The Foley catheter was then connected to continuous bladder irrigation was was capped. This would be used later to fill the bladder up test for a urine leak.  The patient was then reprepped and draped in the routine sterile fashion. A second timeout was then performed. A 12 mm trocar was then placed lateral to the umbilicus at the 62EZ rib. The right robotic arm was placed in the subcostal region 8 cm superior and lateral to the camera. A second robotic arm was placed inferior and medial just above the bladder. The third robotic arm was placed in the anterior axillary line between the anterior iliac spine and the 12 rib. 2 assistant ports were placed in the midline supraumbilical. These are 12 mm ports. The robot was then docked from the side. We then proceeded to take down the right colon and reflected it medially off the kidney. I was able to isolate the ureter below the kidney and dissect underneath it to lift it up.  I used the third arm to hold the ureter and retract the kidney up and medial. I Then continued to reflect the colon and expose the IVC. I then worked from inferior to superior along the IVC to mobilize the kidney. The renal vein became readily apparent which then was dissected free. The  artery was noted to be superior and posterior to the renal vein. Once this was dissected out a vascular stapler was used and the artery taken with one staple load. A second staple load was then used to take the renal vein. This point we worked superiorly and spared the adrenal gland. Once the adrenal gland was noted to be off the  kidney we then used 2 more staple loads to free Gerona's fat as well as the perforating vessels along the anterio-superior aspect of the kidney. We then continued to free the kidney both laterally and posteriorly. Once the kidney was noted to be free we worked distally on the ureter dissecting the ureter down into the pelvis. The posterior peritoneal fascia was opened and the ureter traced down to the bladder. Once in the bladder a 2 -0 V- lock stitch was placed in the medial aspect of the ureter and used as a holding stitch. I then dissected out the distal ureter and took a large cuff of ureter and bladder with the specimen. The bladder was then closed in 2 layers with a 2-0 Vicryl V LOC. 200 mL was then instilled into the patient's bladder and there was no appreciable leak into the patient. At this point the renal hilum and adrenal bed was then reinspected and surgeri-flow was used to achieve perfect hemostasis. We then put a 56 [place] Blake drain through the fourth robotic arm port and placed it down near the bladder. I then passed the large Endo Catch bag through the supraumbilical assistant port and were able to get most of the specimen into the Endo Catch bag. The robotic was then undocked and all the ports removed. We then extended the incision supraumbilically and around the umbilicus inferiorly making approximate 5 cm incision which was just large enough to extract the specimen.  Once the specimen was out we then dissected out the patient's umbilical hernia getting all the omentum out of the hernia. We then excised the defect within the fascia and incorporated the hernia defect into our closure of the extraction incision. This was closed by approximating the posterior rectus sheath with a series of 1-0 Prolenes in a figure-of-eight fashion. The other 12 mm ports were closed with a 0 Vicryl in a figure of 8 stitch at the fascia. A second deep dermal layer was then run on the extraction incision.  Exparel  was then injected into all the incisions and the incisions closed with surgical skin stapler. At the end of the case all left Seals and sponges had been accounted for. Patient was subsequently extubated and returned to the PACU in excellent condition.  Ardis Hughs, M.D.

## 2014-06-06 LAB — CBC
HCT: 32.9 % — ABNORMAL LOW (ref 39.0–52.0)
HEMOGLOBIN: 10.5 g/dL — AB (ref 13.0–17.0)
MCH: 28.2 pg (ref 26.0–34.0)
MCHC: 31.9 g/dL (ref 30.0–36.0)
MCV: 88.2 fL (ref 78.0–100.0)
PLATELETS: 211 10*3/uL (ref 150–400)
RBC: 3.73 MIL/uL — AB (ref 4.22–5.81)
RDW: 14.9 % (ref 11.5–15.5)
WBC: 10.1 10*3/uL (ref 4.0–10.5)

## 2014-06-06 LAB — BASIC METABOLIC PANEL
Anion gap: 8 (ref 5–15)
BUN: 21 mg/dL (ref 6–23)
CO2: 26 mmol/L (ref 19–32)
CREATININE: 1.86 mg/dL — AB (ref 0.50–1.35)
Calcium: 8.9 mg/dL (ref 8.4–10.5)
Chloride: 103 mmol/L (ref 96–112)
GFR, EST AFRICAN AMERICAN: 38 mL/min — AB (ref >90)
GFR, EST NON AFRICAN AMERICAN: 33 mL/min — AB (ref >90)
GLUCOSE: 145 mg/dL — AB (ref 70–99)
POTASSIUM: 3.9 mmol/L (ref 3.5–5.1)
Sodium: 137 mmol/L (ref 135–145)

## 2014-06-06 LAB — GLUCOSE, CAPILLARY: Glucose-Capillary: 153 mg/dL — ABNORMAL HIGH (ref 70–99)

## 2014-06-06 NOTE — Progress Notes (Signed)
1 Day Post-Op Subjective: Patient reports a little sore and right upper abdomen.Tolerating clears. Objective: Vital signs in last 24 hours: Temp:  [97.4 F (36.3 C)-98.5 F (36.9 C)] 98.5 F (36.9 C) (02/27 1306) Pulse Rate:  [61-89] 61 (02/27 1306) Resp:  [14-21] 16 (02/27 1306) BP: (102-136)/(50-83) 121/50 mmHg (02/27 1306) SpO2:  [95 %-100 %] 95 % (02/27 1306) Weight:  [107.049 kg (236 lb)] 107.049 kg (236 lb) (02/26 1900)  Intake/Output from previous day: 02/26 0701 - 02/27 0700 In: 4310.8 [P.O.:240; I.V.:3670.8; IV Piggyback:400] Out: 1050 [Urine:800; Drains:150; Blood:100] Intake/Output this shift: Total I/O In: 1905 [P.O.:960; I.V.:875; Other:70] Out: 475 [Urine:475]  Physical Exam:  No acute distress, alert and oriented, looks well Sitting in chair Abdomen soft and nontender; JP output low Extremities-no Swelling or pain GU-Foley catheter in place, urine clear  Lab Results:  Recent Labs  06/05/14 1830 06/06/14 0555  HGB 12.4* 10.5*  HCT 38.3* 32.9*   BMET  Recent Labs  06/05/14 1830 06/06/14 0555  NA 137 137  K 3.7 3.9  CL 105 103  CO2 23 26  GLUCOSE 180* 145*  BUN 18 21  CREATININE 1.49* 1.86*  CALCIUM 8.8 8.9   No results for input(s): LABPT, INR in the last 72 hours. No results for input(s): LABURIN in the last 72 hours. Results for orders placed or performed during the hospital encounter of 06/01/14  Urine culture     Status: None   Collection Time: 06/01/14 12:15 PM  Result Value Ref Range Status   Specimen Description URINE, RANDOM  Final   Special Requests NONE  Final   Colony Count NO GROWTH Performed at [facility]   Final   Culture NO GROWTH Performed at [facility]   Final   Report Status 06/02/2014 FINAL  Final    Studies/Results: No results found.  Assessment/Plan: Postop day 1 - Right robotic-assisted laparoscopic radical nephroureterectomy, ventral hernia repair: --stable -Decrease IV  fluids --Regular diet        LOS: 1 day   [name] [name] 06/06/2014, 4:47 PM

## 2014-06-06 NOTE — Evaluation (Signed)
Physical Therapy Evaluation Patient Details Name: [name] [name] MRN: [id number] DOB: [birth year] Today's Date: 06/06/2014   History of Present Illness  81 yo male adm 06/05/14 and now   s/p  Robotic-assisted laparoscopic nephroureterectomy and ventral hernia repair; PMHx: CABG, MI, TURP, anxiety  Clinical Impression  Pt admitted with above diagnosis. Pt currently with functional limitations due to the deficits listed below (see PT Problem List). Pt will benefit from skilled PT to increase their independence and safety with mobility to allow discharge to the venue listed below.       Follow Up Recommendations Home health PT;Supervision/Assistance - 24 hour    Equipment Recommendations  None recommended by PT    Recommendations for Other Services       Precautions / Restrictions Precautions Precautions: None Precaution Comments: JP drain       Mobility  Bed Mobility Overal bed mobility: Needs Assistance Bed Mobility: Rolling;Sidelying to Sit Rolling: Min assist Sidelying to sit: Min assist       General bed mobility comments: multi-modal  cues for technique  Transfers Overall transfer level: Needs assistance Equipment used: Rolling walker (2 wheeled) Transfers: Sit to/from Stand Sit to Stand: Min assist         General transfer comment: verbal cues for hand placement and safety  Ambulation/Gait Ambulation/Gait assistance: Min assist   Assistive device: Rolling walker (2 wheeled) Gait Pattern/deviations: Step-through pattern;Decreased stride length;Trunk flexed  120' with RW   General Gait Details: multi-modal cues for RW safety, distance from self;    Stairs            Wheelchair Mobility    Modified Rankin (Stroke Patients Only)       Balance Overall balance assessment: Needs assistance Sitting-balance support: Feet supported;No upper extremity supported Sitting balance-Leahy Scale: Fair       Standing balance-Leahy Scale: Poor                                Pertinent Vitals/Pain Pain Assessment: 0-10 Pain Score: 5  Pain Location: abd Pain Descriptors / Indicators: Sore Pain Intervention(s): Limited activity within patient's tolerance;Monitored during session;Premedicated before session    Home Living Family/patient expects to be discharged to:: Private residence Living Arrangements: Alone Available Help at Discharge: Family;Available 24 hours/day Type of Home: House Home Access: Stairs to enter   [employer] of Steps: 1 Home Layout: One level;Laundry or work area in [place]: Kasandra Knudsen - single point;Wheelchair - [employer] - 2 wheels Additional Comments: used w/c in the house and when going out;     Prior Function           Comments: pt is not a great historian at time of PT eval, gives conflicting reports of  baseline functional status;     Hand Dominance        Extremity/Trunk Assessment               Lower Extremity Assessment: Overall WFL for tasks assessed         Communication   Communication: No difficulties  Cognition Arousal/Alertness: Awake/alert Behavior During Therapy: WFL for tasks assessed/performed Overall Cognitive Status: Impaired/Different from baseline Area of Impairment: Problem solving         Safety/Judgement: Decreased awareness of safety   Problem Solving: Difficulty sequencing;Requires verbal cues;Slow processing      General Comments      Exercises        Assessment/Plan  PT Assessment Patient needs continued PT services  PT Diagnosis Difficulty walking   PT Problem List Decreased strength;Decreased balance;Decreased mobility;Decreased activity tolerance;Decreased knowledge of use of DME;Decreased safety awareness  PT Treatment Interventions DME instruction;Gait training;Functional mobility training;Therapeutic activities;Therapeutic exercise;Balance training;Patient/family education   PT Goals (Current  goals can be found in the Care Plan section) Acute Rehab PT Goals Patient Stated Goal: get better PT Goal Formulation: With patient Time For Goal Achievement: 06/20/14 Potential to Achieve Goals: Good    Frequency Min 3X/week   Barriers to discharge        Co-evaluation               End of Session Equipment Utilized During Treatment: Gait belt Activity Tolerance: Patient tolerated treatment well Patient left: with call bell/phone within reach;in chair           Time: 1119-1141 PT Time Calculation (min) (ACUTE ONLY): 22 min   Charges:   PT Evaluation $Initial PT Evaluation Tier I: 1 Procedure     PT G CodesKenyon Ana 06/09/2014, 1:24 PM

## 2014-06-07 LAB — BASIC METABOLIC PANEL
Anion gap: 5 (ref 5–15)
BUN: 18 mg/dL (ref 6–23)
CALCIUM: 8.7 mg/dL (ref 8.4–10.5)
CHLORIDE: 104 mmol/L (ref 96–112)
CO2: 28 mmol/L (ref 19–32)
Creatinine, Ser: 1.9 mg/dL — ABNORMAL HIGH (ref 0.50–1.35)
GFR calc Af Amer: 37 mL/min — ABNORMAL LOW (ref >90)
GFR calc non Af Amer: 32 mL/min — ABNORMAL LOW (ref >90)
GLUCOSE: 108 mg/dL — AB (ref 70–99)
Potassium: 3.9 mmol/L (ref 3.5–5.1)
SODIUM: 137 mmol/L (ref 135–145)

## 2014-06-07 NOTE — Progress Notes (Addendum)
  Pt without complaints. He a very little breakfast. Lunch hasn't been served. Otherwise, looks good, but has ambulated little today. He's needed PT and a walker in the past.   Filed Vitals:   06/07/14 0635  BP: 139/64  Pulse: 72  Temp: 98.3 F (36.8 C)  Resp: 18     Intake/Output Summary (Last 24 hours) at 06/07/14 1322 Last data filed at 06/07/14 0700  Gross per 24 hour  Intake   3010 ml  Output   1640 ml  Net   1370 ml   JP about 50 per shift  PE: NAD Watching TV in bed Abd - soft, NT, dressing removed - incisions C/D/I without E/E/I Ext - no calf pain or swelling   BMET    Component Value Date/Time   NA 137 06/07/2014 1100   NA 138 03/01/2010   K 3.9 06/07/2014 1100   CL 104 06/07/2014 1100   CO2 28 06/07/2014 1100   GLUCOSE 108* 06/07/2014 1100   BUN 18 06/07/2014 1100   CREATININE 1.90* 06/07/2014 1100   CALCIUM 8.7 06/07/2014 1100   GFRNONAA 32* 06/07/2014 1100   GFRAA 37* 06/07/2014 1100    A/P - POD#2 right nephro - U - -PT eval -he hasn't ordered lunch - he is calling now  -cont slow IVF @ 75 cc/hr -Cr 1.9, may be new baseline.  -Pt stable - maybe home in AM

## 2014-06-08 ENCOUNTER — Encounter (HOSPITAL_COMMUNITY): Payer: Self-pay | Admitting: Urology

## 2014-06-08 LAB — CREATININE, FLUID (PLEURAL, PERITONEAL, JP DRAINAGE): Creat, Fluid: 1.9 mg/dL

## 2014-06-08 MED ORDER — HYDROCODONE-ACETAMINOPHEN 5-325 MG PO TABS: 1.0000 | ORAL_TABLET | Freq: Four times a day (QID) | ORAL | Status: DC | PRN

## 2014-06-08 NOTE — Care Management Note (Addendum)
    Page 1 of 1   06/08/2014     2:08:06 PM CARE MANAGEMENT NOTE 06/08/2014  Patient:  [name] [name],[name] [name]   Account Number:  [id number]  Date Initiated:  06/08/2014  Documentation initiated by:  Dessa Phi  Subjective/Objective Assessment:   81 y/o m admitted w/Transitional cell CA kidney.     Action/Plan:   From home alone.Has dtr support.Has rw, w/[name],3n1.   Anticipated DC Date:  06/08/2014   Anticipated DC Plan:  [place]  CM consult      Choice offered to / List presented to:  [name]-4 Adult Children        HH arranged  HH-2 PT      [place].   Status of service:  Completed, signed off Medicare Important Message given?  YES (If response is "NO", the following Medicare IM given date fields will be blank) Date Medicare IM given:  06/08/2014 Medicare IM given by:  [facility] Date Additional Medicare IM given:   Additional Medicare IM given by:    Discharge Disposition:  [place]  Per UR Regulation:  Reviewed for med. necessity/level of care/duration of stay  If discussed at [place] of Stay Meetings, dates discussed:    Comments:  06/08/14 Dessa Phi RN BSN NCM [phone] Patient has caregivers to asst with f/[name]. Spoke to patient/dtr in rm about PT-recc HHPT.Dtr/Patient want home w/HH.Dtr still concerned about caring for patient @ home,but has 24 hr care givers, & dme.Explained what PT recc, & MD ordered HHPT.Patient/dtr in agreement to home w/HHC. [facility] rep aware of d/[name] & HHPT order.No further d/[name] needs.

## 2014-06-08 NOTE — Progress Notes (Signed)
Physical Therapy Treatment Patient Details Name: [name] [name] MRN: [id number] DOB: 29-Sep-1933 Today's Date: 06/08/2014    History of Present Illness 81 yo male adm 06/05/14 and now   s/p  Robotic-assisted laparoscopic nephroureterectomy and ventral hernia repair; PMHx: CABG, MI, TURP, anxiety    PT Comments    Pt ambulated 100' with RW and min/guard assist. He is progressing well with mobility. He requires some verbal cues for safety due to dementia. He will have 24* assist at home per family. *  Follow Up Recommendations  Home health PT;Supervision/Assistance - 24 hour     Equipment Recommendations  None recommended by PT    Recommendations for Other Services       Precautions / Restrictions Precautions Precautions: Fall Restrictions Weight Bearing Restrictions: No    Mobility  Bed Mobility                  Transfers Overall transfer level: Needs assistance Equipment used: Rolling walker (2 wheeled) Transfers: Sit to/from Stand Sit to Stand: Min guard         General transfer comment: verbal cues for hand placement and safety  Ambulation/Gait Ambulation/Gait assistance: Min guard Ambulation Distance (Feet): 100 Feet Assistive device: Rolling walker (2 wheeled) Gait Pattern/deviations: Step-through pattern     General Gait Details: min/guard for safety, pt steady with RW   Stairs            Wheelchair Mobility    Modified Rankin (Stroke Patients Only)       Balance                                    Cognition Arousal/Alertness: Awake/alert Behavior During Therapy: Impulsive Overall Cognitive Status: Impaired/Different from baseline Area of Impairment: Problem solving         Safety/Judgement: Decreased awareness of safety   Problem Solving: Difficulty sequencing;Requires verbal cues;Slow processing      Exercises      General Comments        Pertinent Vitals/Pain Pain Score: 3  Pain Location: low  back Pain Intervention(s): Monitored during session    Home Living                      Prior Function            PT Goals (current goals can now be found in the care plan section) Acute Rehab PT Goals Patient Stated Goal: get better PT Goal Formulation: With patient/family Time For Goal Achievement: 06/20/14 Potential to Achieve Goals: Good Progress towards PT goals: Progressing toward goals    Frequency  Min 3X/week    PT Plan Current plan remains appropriate    Co-evaluation             End of Session Equipment Utilized During Treatment: Gait belt Activity Tolerance: Patient tolerated treatment well Patient left: with call bell/phone within reach;in chair;with family/visitor present     Time: 1308-6578 PT Time Calculation (min) (ACUTE ONLY): 18 min  Charges:  $Gait Training: 8-22 mins                    G Codes:      Philomena Doheny 06/08/2014, 11:53 AM [phone]

## 2014-06-08 NOTE — Progress Notes (Signed)
Urology Inpatient Progress Report POD#3 s/p right robotic nephroureterectomy  Intv/Subj: No acute events overnight. Patient is without complaint.  Past Medical History  Diagnosis Date  . Chronic constipation   . GERD (gastroesophageal reflux disease)   . Arthritis   . Hyperlipidemia   . Orthostatic hypotension 11/19/2013  . Coronary artery disease     CARDIOLOGIST-  DR BERRY  --  HX  STEMI  12-17-2013  S/P  CABG X4  12-18-2013  . Mild dementia   . Type 2 diabetes mellitus   . Hypothyroidism   . History of esophageal dilatation   . History of gastritis   . History of hiatal hernia   . History of bladder cancer     hx  TCC low grade  . Prostate cancer     low-grade  . Kidney tumor     right  . BPH (benign prostatic hyperplasia)   . CKD (chronic kidney disease) stage 3, GFR 30-59 ml/min   . History of kidney stones   . ED (erectile dysfunction)   . S/P CABG x 4     12-18-2013  . Bladder tumor   . Complication of anesthesia     HARD TO WAKE  . OSA on CPAP     mild osa   per sleep study 07-11-2009- patient states does not use regularly   Current Facility-Administered Medications  Medication Dose Route Frequency Provider Last Rate Last Dose  . bisacodyl (DULCOLAX) suppository 10 mg  10 mg Rectal Daily PRN Ardis Hughs, MD      . docusate sodium (COLACE) capsule 100 mg  100 mg Oral BID Ardis Hughs, MD   100 mg at 06/07/14 2126  . donepezil (ARICEPT) tablet 10 mg  10 mg Oral QHS Ardis Hughs, MD   10 mg at 06/07/14 2126  . escitalopram (LEXAPRO) tablet 10 mg  10 mg Oral q morning - 10a Ardis Hughs, MD   10 mg at 06/07/14 0932  . fludrocortisone (FLORINEF) tablet 0.2 mg  0.2 mg Oral Daily Ardis Hughs, MD   0.2 mg at 06/07/14 0932  . heparin injection 5,000 Units  5,000 Units Subcutaneous 3 times per day Ardis Hughs, MD   5,000 Units at 06/08/14 [phone]  . levothyroxine (SYNTHROID, LEVOTHROID) tablet 125 mcg  125 mcg Oral QAC breakfast  Ardis Hughs, MD   125 mcg at 06/08/14 [phone]  . midodrine (PROAMATINE) tablet 10 mg  10 mg Oral BID WC Ardis Hughs, MD   10 mg at 06/08/14 0753  . morphine 2 MG/ML injection 2 mg  2 mg Intravenous Q1H PRN Ardis Hughs, MD   2 mg at 06/06/14 2019  . ondansetron (ZOFRAN) injection 4 mg  4 mg Intravenous Q6H PRN Ardis Hughs, MD      . ondansetron [facility]) tablet 4 mg  4 mg Oral Q8H PRN Ardis Hughs, MD      . opium-belladonna (B&O SUPPRETTES) 16.2-60 MG suppository 1 suppository  1 suppository Rectal Q8H PRN Ardis Hughs, MD   1 suppository at 06/07/14 1616  . oxyCODONE (Oxy IR/ROXICODONE) immediate release tablet 5-10 mg  5-10 mg Oral Q4H PRN Ardis Hughs, MD   10 mg at 06/07/14 2125  . pantoprazole (PROTONIX) injection 40 mg  40 mg Intravenous QHS Ardis Hughs, MD   40 mg at 06/07/14 2125  . polyethylene glycol (MIRALAX / GLYCOLAX) packet 17 g  17 g Oral QHS Ardis Hughs,  MD   17 g at 06/07/14 2125     Objective: Vital: Filed Vitals:   06/07/14 0635 06/07/14 1505 06/07/14 2011 06/08/14 0513  BP: 139/64 147/68 160/79 150/64  Pulse: 72 67 72 66  Temp: 98.3 F (36.8 C) 99.2 F (37.3 C) 98.6 F (37 C) 98 F (36.7 C)  TempSrc: Oral Other (Comment) Oral Oral  Resp: 18 20 18 18   Height:      Weight:      SpO2: 96% 93% 94% 94%   I/Os: I/O last 3 completed shifts: In: 8786 [P.O.:2400; I.V.:2935] Out: 7672 [Urine:3375; Drains:180]  Physical Exam:  General: Patient is in no apparent distress Lungs: Normal respiratory effort, chest expands symmetrically. GI: The abdomen is soft, incisions are clean/dry/intact - JP draining serosang fluid.  Foley clear yellow. Ext: lower extremities symmetric  Lab Results:  Recent Labs  06/05/14 1830 06/06/14 0555  WBC 13.2* 10.1  HGB 12.4* 10.5*  HCT 38.3* 32.9*    Recent Labs  06/05/14 1830 06/06/14 0555 06/07/14 1100  NA 137 137 137  K 3.7 3.9 3.9  CL 105 103 104  CO2 23 26 28    GLUCOSE 180* 145* 108*  BUN 18 21 18   CREATININE 1.49* 1.86* 1.90*  CALCIUM 8.8 8.9 8.7   No results for input(s): LABPT, INR in the last 72 hours. No results for input(s): LABURIN in the last 72 hours. Results for orders placed or performed during the hospital encounter of 06/01/14  Urine culture     Status: None   Collection Time: 06/01/14 12:15 PM  Result Value Ref Range Status   Specimen Description URINE, RANDOM  Final   Special Requests NONE  Final   Colony Count NO GROWTH Performed at [facility]   Final   Culture NO GROWTH Performed at [facility]   Final   Report Status 06/02/2014 FINAL  Final    Studies/Results:   Assessment: 3 Days Post-Op doing very well.  Plan: Hoping to get patient home today with home health.  Would like to remove JP prior to discharge, JP creatinine pending.  Will go home with foley.  Ardis Hughs 06/08/2014, 7:56 AM

## 2014-06-09 DIAGNOSIS — I2510 Atherosclerotic heart disease of native coronary artery without angina pectoris: Secondary | ICD-10-CM | POA: Diagnosis not present

## 2014-06-09 DIAGNOSIS — R269 Unspecified abnormalities of gait and mobility: Secondary | ICD-10-CM | POA: Diagnosis not present

## 2014-06-09 DIAGNOSIS — E039 Hypothyroidism, unspecified: Secondary | ICD-10-CM | POA: Diagnosis not present

## 2014-06-09 DIAGNOSIS — I252 Old myocardial infarction: Secondary | ICD-10-CM | POA: Diagnosis not present

## 2014-06-09 DIAGNOSIS — Z48815 Encounter for surgical aftercare following surgery on the digestive system: Secondary | ICD-10-CM | POA: Diagnosis not present

## 2014-06-09 DIAGNOSIS — Z951 Presence of aortocoronary bypass graft: Secondary | ICD-10-CM | POA: Diagnosis not present

## 2014-06-09 DIAGNOSIS — M6281 Muscle weakness (generalized): Secondary | ICD-10-CM | POA: Diagnosis not present

## 2014-06-09 DIAGNOSIS — Z483 Aftercare following surgery for neoplasm: Secondary | ICD-10-CM | POA: Diagnosis not present

## 2014-06-11 DIAGNOSIS — I2510 Atherosclerotic heart disease of native coronary artery without angina pectoris: Secondary | ICD-10-CM | POA: Diagnosis not present

## 2014-06-11 DIAGNOSIS — Z48815 Encounter for surgical aftercare following surgery on the digestive system: Secondary | ICD-10-CM | POA: Diagnosis not present

## 2014-06-11 DIAGNOSIS — R269 Unspecified abnormalities of gait and mobility: Secondary | ICD-10-CM | POA: Diagnosis not present

## 2014-06-11 DIAGNOSIS — Z483 Aftercare following surgery for neoplasm: Secondary | ICD-10-CM | POA: Diagnosis not present

## 2014-06-11 DIAGNOSIS — I252 Old myocardial infarction: Secondary | ICD-10-CM | POA: Diagnosis not present

## 2014-06-11 DIAGNOSIS — M6281 Muscle weakness (generalized): Secondary | ICD-10-CM | POA: Diagnosis not present

## 2014-06-11 DIAGNOSIS — E039 Hypothyroidism, unspecified: Secondary | ICD-10-CM | POA: Diagnosis not present

## 2014-06-11 DIAGNOSIS — Z951 Presence of aortocoronary bypass graft: Secondary | ICD-10-CM | POA: Diagnosis not present

## 2014-06-12 ENCOUNTER — Encounter (HOSPITAL_COMMUNITY): Payer: Self-pay | Admitting: Emergency Medicine

## 2014-06-12 ENCOUNTER — Emergency Department (HOSPITAL_COMMUNITY): Payer: Medicare Other

## 2014-06-12 ENCOUNTER — Inpatient Hospital Stay (HOSPITAL_COMMUNITY)
Admission: EM | Admit: 2014-06-12 | Discharge: 2014-06-22 | DRG: 092 | Disposition: A | Discharge status: Skilled Nursing Facility | Payer: Medicare Other | Attending: Internal Medicine | Admitting: Internal Medicine

## 2014-06-12 DIAGNOSIS — M1990 Unspecified osteoarthritis, unspecified site: Secondary | ICD-10-CM | POA: Diagnosis not present

## 2014-06-12 DIAGNOSIS — R410 Disorientation, unspecified: Secondary | ICD-10-CM | POA: Diagnosis not present

## 2014-06-12 DIAGNOSIS — R7989 Other specified abnormal findings of blood chemistry: Secondary | ICD-10-CM | POA: Diagnosis present

## 2014-06-12 DIAGNOSIS — D649 Anemia, unspecified: Secondary | ICD-10-CM | POA: Diagnosis present

## 2014-06-12 DIAGNOSIS — R1310 Dysphagia, unspecified: Secondary | ICD-10-CM | POA: Diagnosis present

## 2014-06-12 DIAGNOSIS — K5900 Constipation, unspecified: Secondary | ICD-10-CM | POA: Diagnosis not present

## 2014-06-12 DIAGNOSIS — R41841 Cognitive communication deficit: Secondary | ICD-10-CM | POA: Diagnosis not present

## 2014-06-12 DIAGNOSIS — I214 Non-ST elevation (NSTEMI) myocardial infarction: Secondary | ICD-10-CM | POA: Diagnosis not present

## 2014-06-12 DIAGNOSIS — Z905 Acquired absence of kidney: Secondary | ICD-10-CM | POA: Diagnosis not present

## 2014-06-12 DIAGNOSIS — E46 Unspecified protein-calorie malnutrition: Secondary | ICD-10-CM | POA: Diagnosis not present

## 2014-06-12 DIAGNOSIS — R2681 Unsteadiness on feet: Secondary | ICD-10-CM | POA: Diagnosis not present

## 2014-06-12 DIAGNOSIS — Z951 Presence of aortocoronary bypass graft: Secondary | ICD-10-CM

## 2014-06-12 DIAGNOSIS — I82B12 Acute embolism and thrombosis of left subclavian vein: Secondary | ICD-10-CM | POA: Diagnosis not present

## 2014-06-12 DIAGNOSIS — E039 Hypothyroidism, unspecified: Secondary | ICD-10-CM | POA: Diagnosis present

## 2014-06-12 DIAGNOSIS — H539 Unspecified visual disturbance: Secondary | ICD-10-CM

## 2014-06-12 DIAGNOSIS — N3289 Other specified disorders of bladder: Secondary | ICD-10-CM | POA: Diagnosis not present

## 2014-06-12 DIAGNOSIS — G92 Toxic encephalopathy: Secondary | ICD-10-CM | POA: Diagnosis not present

## 2014-06-12 DIAGNOSIS — C651 Malignant neoplasm of right renal pelvis: Secondary | ICD-10-CM | POA: Diagnosis not present

## 2014-06-12 DIAGNOSIS — F0390 Unspecified dementia without behavioral disturbance: Secondary | ICD-10-CM

## 2014-06-12 DIAGNOSIS — N289 Disorder of kidney and ureter, unspecified: Secondary | ICD-10-CM | POA: Insufficient documentation

## 2014-06-12 DIAGNOSIS — N139 Obstructive and reflux uropathy, unspecified: Secondary | ICD-10-CM | POA: Diagnosis not present

## 2014-06-12 DIAGNOSIS — F0391 Unspecified dementia with behavioral disturbance: Secondary | ICD-10-CM | POA: Diagnosis present

## 2014-06-12 DIAGNOSIS — F339 Major depressive disorder, recurrent, unspecified: Secondary | ICD-10-CM | POA: Diagnosis not present

## 2014-06-12 DIAGNOSIS — C641 Malignant neoplasm of right kidney, except renal pelvis: Secondary | ICD-10-CM

## 2014-06-12 DIAGNOSIS — K219 Gastro-esophageal reflux disease without esophagitis: Secondary | ICD-10-CM | POA: Diagnosis present

## 2014-06-12 DIAGNOSIS — C679 Malignant neoplasm of bladder, unspecified: Secondary | ICD-10-CM | POA: Diagnosis present

## 2014-06-12 DIAGNOSIS — Z87442 Personal history of urinary calculi: Secondary | ICD-10-CM | POA: Diagnosis not present

## 2014-06-12 DIAGNOSIS — Z66 Do not resuscitate: Secondary | ICD-10-CM | POA: Diagnosis present

## 2014-06-12 DIAGNOSIS — R262 Difficulty in walking, not elsewhere classified: Secondary | ICD-10-CM | POA: Diagnosis not present

## 2014-06-12 DIAGNOSIS — Z79899 Other long term (current) drug therapy: Secondary | ICD-10-CM

## 2014-06-12 DIAGNOSIS — R443 Hallucinations, unspecified: Secondary | ICD-10-CM | POA: Diagnosis not present

## 2014-06-12 DIAGNOSIS — Z9889 Other specified postprocedural states: Secondary | ICD-10-CM | POA: Diagnosis not present

## 2014-06-12 DIAGNOSIS — E119 Type 2 diabetes mellitus without complications: Secondary | ICD-10-CM | POA: Diagnosis not present

## 2014-06-12 DIAGNOSIS — D090 Carcinoma in situ of bladder: Secondary | ICD-10-CM | POA: Diagnosis not present

## 2014-06-12 DIAGNOSIS — G4733 Obstructive sleep apnea (adult) (pediatric): Secondary | ICD-10-CM | POA: Diagnosis not present

## 2014-06-12 DIAGNOSIS — I248 Other forms of acute ischemic heart disease: Secondary | ICD-10-CM | POA: Diagnosis not present

## 2014-06-12 DIAGNOSIS — C649 Malignant neoplasm of unspecified kidney, except renal pelvis: Secondary | ICD-10-CM | POA: Diagnosis not present

## 2014-06-12 DIAGNOSIS — Z7982 Long term (current) use of aspirin: Secondary | ICD-10-CM

## 2014-06-12 DIAGNOSIS — C801 Malignant (primary) neoplasm, unspecified: Secondary | ICD-10-CM | POA: Diagnosis not present

## 2014-06-12 DIAGNOSIS — I2510 Atherosclerotic heart disease of native coronary artery without angina pectoris: Secondary | ICD-10-CM | POA: Diagnosis present

## 2014-06-12 DIAGNOSIS — N390 Urinary tract infection, site not specified: Secondary | ICD-10-CM | POA: Diagnosis present

## 2014-06-12 DIAGNOSIS — Z4682 Encounter for fitting and adjustment of non-vascular catheter: Secondary | ICD-10-CM | POA: Diagnosis not present

## 2014-06-12 DIAGNOSIS — I6782 Cerebral ischemia: Secondary | ICD-10-CM | POA: Diagnosis not present

## 2014-06-12 DIAGNOSIS — F05 Delirium due to known physiological condition: Secondary | ICD-10-CM | POA: Diagnosis not present

## 2014-06-12 DIAGNOSIS — H547 Unspecified visual loss: Secondary | ICD-10-CM | POA: Insufficient documentation

## 2014-06-12 DIAGNOSIS — Z888 Allergy status to other drugs, medicaments and biological substances status: Secondary | ICD-10-CM | POA: Diagnosis not present

## 2014-06-12 DIAGNOSIS — N183 Chronic kidney disease, stage 3 (moderate): Secondary | ICD-10-CM | POA: Diagnosis not present

## 2014-06-12 DIAGNOSIS — G9340 Encephalopathy, unspecified: Secondary | ICD-10-CM | POA: Diagnosis not present

## 2014-06-12 DIAGNOSIS — I252 Old myocardial infarction: Secondary | ICD-10-CM | POA: Diagnosis not present

## 2014-06-12 DIAGNOSIS — N179 Acute kidney failure, unspecified: Secondary | ICD-10-CM | POA: Diagnosis not present

## 2014-06-12 DIAGNOSIS — E785 Hyperlipidemia, unspecified: Secondary | ICD-10-CM | POA: Diagnosis not present

## 2014-06-12 DIAGNOSIS — R4182 Altered mental status, unspecified: Secondary | ICD-10-CM

## 2014-06-12 DIAGNOSIS — N200 Calculus of kidney: Secondary | ICD-10-CM | POA: Diagnosis not present

## 2014-06-12 DIAGNOSIS — G9341 Metabolic encephalopathy: Secondary | ICD-10-CM

## 2014-06-12 DIAGNOSIS — Z79891 Long term (current) use of opiate analgesic: Secondary | ICD-10-CM | POA: Diagnosis not present

## 2014-06-12 DIAGNOSIS — H540 Blindness, both eyes: Secondary | ICD-10-CM | POA: Diagnosis not present

## 2014-06-12 DIAGNOSIS — Z8546 Personal history of malignant neoplasm of prostate: Secondary | ICD-10-CM | POA: Diagnosis not present

## 2014-06-12 DIAGNOSIS — F0280 Dementia in other diseases classified elsewhere without behavioral disturbance: Secondary | ICD-10-CM | POA: Diagnosis not present

## 2014-06-12 DIAGNOSIS — I951 Orthostatic hypotension: Secondary | ICD-10-CM | POA: Diagnosis not present

## 2014-06-12 DIAGNOSIS — M6281 Muscle weakness (generalized): Secondary | ICD-10-CM | POA: Diagnosis not present

## 2014-06-12 DIAGNOSIS — Z515 Encounter for palliative care: Secondary | ICD-10-CM | POA: Diagnosis not present

## 2014-06-12 DIAGNOSIS — Z906 Acquired absence of other parts of urinary tract: Secondary | ICD-10-CM | POA: Diagnosis not present

## 2014-06-12 DIAGNOSIS — Z8551 Personal history of malignant neoplasm of bladder: Secondary | ICD-10-CM | POA: Diagnosis not present

## 2014-06-12 DIAGNOSIS — R944 Abnormal results of kidney function studies: Secondary | ICD-10-CM | POA: Diagnosis not present

## 2014-06-12 DIAGNOSIS — E876 Hypokalemia: Secondary | ICD-10-CM | POA: Diagnosis present

## 2014-06-12 DIAGNOSIS — R778 Other specified abnormalities of plasma proteins: Secondary | ICD-10-CM | POA: Diagnosis present

## 2014-06-12 DIAGNOSIS — R1314 Dysphagia, pharyngoesophageal phase: Secondary | ICD-10-CM | POA: Diagnosis not present

## 2014-06-12 LAB — CBC WITH DIFFERENTIAL/PLATELET
BASOS ABS: 0.1 10*3/uL (ref 0.0–0.1)
BASOS PCT: 1 % (ref 0–1)
EOS ABS: 0.8 10*3/uL — AB (ref 0.0–0.7)
EOS PCT: 7 % — AB (ref 0–5)
HCT: 32.6 % — ABNORMAL LOW (ref 39.0–52.0)
Hemoglobin: 10.4 g/dL — ABNORMAL LOW (ref 13.0–17.0)
LYMPHS ABS: 1.7 10*3/uL (ref 0.7–4.0)
Lymphocytes Relative: 15 % (ref 12–46)
MCH: 28 pg (ref 26.0–34.0)
MCHC: 31.9 g/dL (ref 30.0–36.0)
MCV: 87.9 fL (ref 78.0–100.0)
Monocytes Absolute: 0.9 10*3/uL (ref 0.1–1.0)
Monocytes Relative: 9 % (ref 3–12)
Neutro Abs: 7.6 10*3/uL (ref 1.7–7.7)
Neutrophils Relative %: 68 % (ref 43–77)
PLATELETS: 260 10*3/uL (ref 150–400)
RBC: 3.71 MIL/uL — ABNORMAL LOW (ref 4.22–5.81)
RDW: 15.4 % (ref 11.5–15.5)
WBC: 11.1 10*3/uL — ABNORMAL HIGH (ref 4.0–10.5)

## 2014-06-12 LAB — BASIC METABOLIC PANEL
ANION GAP: 9 (ref 5–15)
BUN: 26 mg/dL — ABNORMAL HIGH (ref 6–23)
CO2: 26 mmol/L (ref 19–32)
CREATININE: 2.15 mg/dL — AB (ref 0.50–1.35)
Calcium: 9 mg/dL (ref 8.4–10.5)
Chloride: 102 mmol/L (ref 96–112)
GFR calc Af Amer: 32 mL/min — ABNORMAL LOW (ref >90)
GFR, EST NON AFRICAN AMERICAN: 28 mL/min — AB (ref >90)
GLUCOSE: 96 mg/dL (ref 70–99)
Potassium: 3.2 mmol/L — ABNORMAL LOW (ref 3.5–5.1)
Sodium: 137 mmol/L (ref 135–145)

## 2014-06-12 LAB — URINALYSIS, ROUTINE W REFLEX MICROSCOPIC
BILIRUBIN URINE: NEGATIVE
GLUCOSE, UA: NEGATIVE mg/dL
Ketones, ur: NEGATIVE mg/dL
Nitrite: NEGATIVE
PROTEIN: 30 mg/dL — AB
Specific Gravity, Urine: 1.015 (ref 1.005–1.030)
UROBILINOGEN UA: 0.2 mg/dL (ref 0.0–1.0)
pH: 6.5 (ref 5.0–8.0)

## 2014-06-12 LAB — URINE MICROSCOPIC-ADD ON

## 2014-06-12 LAB — TROPONIN I
TROPONIN I: 0.27 ng/mL — AB (ref <0.031)
Troponin I: 0.31 ng/mL — ABNORMAL HIGH (ref <0.031)

## 2014-06-12 LAB — LACTIC ACID, PLASMA
Lactic Acid, Venous: 1.3 mmol/L (ref 0.5–2.0)
Lactic Acid, Venous: 1 mmol/L (ref 0.5–2.0)

## 2014-06-12 MED ORDER — ONDANSETRON HCL 4 MG PO TABS: 4.0000 mg | ORAL_TABLET | Freq: Four times a day (QID) | ORAL | Status: DC | PRN

## 2014-06-12 MED ORDER — CEFTRIAXONE SODIUM IN DEXTROSE 20 MG/ML IV SOLN
1.0000 g | INTRAVENOUS | Status: DC
  Administered 2014-06-13 – 2014-06-18 (×6): 1 g via INTRAVENOUS
  Filled 2014-06-12 (×8): qty 50

## 2014-06-12 MED ORDER — PANTOPRAZOLE SODIUM 40 MG PO TBEC
40.0000 mg | DELAYED_RELEASE_TABLET | Freq: Every morning | ORAL | Status: DC
  Administered 2014-06-13: 40 mg via ORAL
  Filled 2014-06-12: qty 1

## 2014-06-12 MED ORDER — DONEPEZIL HCL 5 MG PO TABS: 10.0000 mg | ORAL_TABLET | Freq: Every day | ORAL | Status: DC

## 2014-06-12 MED ORDER — ONDANSETRON HCL 4 MG/2ML IJ SOLN: 4.0000 mg | Freq: Four times a day (QID) | INTRAMUSCULAR | Status: DC | PRN

## 2014-06-12 MED ORDER — POTASSIUM CHLORIDE IN NACL 20-0.9 MEQ/L-% IV SOLN
INTRAVENOUS | Status: AC
  Administered 2014-06-13: 01:00:00 via INTRAVENOUS
  Filled 2014-06-12: qty 1000

## 2014-06-12 MED ORDER — LEVOTHYROXINE SODIUM 25 MCG PO TABS
125.0000 ug | ORAL_TABLET | Freq: Every day | ORAL | Status: DC
  Administered 2014-06-13: 125 ug via ORAL
  Filled 2014-06-12 (×2): qty 1

## 2014-06-12 MED ORDER — ESCITALOPRAM OXALATE 20 MG PO TABS
20.0000 mg | ORAL_TABLET | Freq: Every day | ORAL | Status: DC
  Administered 2014-06-13: 20 mg via ORAL
  Filled 2014-06-12: qty 1

## 2014-06-12 MED ORDER — SODIUM CHLORIDE 0.9 % IV SOLN
INTRAVENOUS | Status: DC
  Administered 2014-06-12: 22:00:00 via INTRAVENOUS

## 2014-06-12 MED ORDER — LORAZEPAM 2 MG/ML IJ SOLN
0.5000 mg | Freq: Once | INTRAMUSCULAR | Status: AC
  Administered 2014-06-12: 0.5 mg via INTRAVENOUS
  Filled 2014-06-12: qty 1

## 2014-06-12 MED ORDER — LORAZEPAM 2 MG/ML IJ SOLN
1.0000 mg | INTRAMUSCULAR | Status: DC | PRN
  Administered 2014-06-12: 1 mg via INTRAVENOUS

## 2014-06-12 MED ORDER — ASPIRIN EC 325 MG PO TBEC
325.0000 mg | DELAYED_RELEASE_TABLET | Freq: Every day | ORAL | Status: DC
  Administered 2014-06-13 – 2014-06-22 (×9): 325 mg via ORAL
  Filled 2014-06-12 (×10): qty 1

## 2014-06-12 MED ORDER — SODIUM CHLORIDE 0.9 % IV SOLN: INTRAVENOUS | Status: DC

## 2014-06-12 MED ORDER — LORAZEPAM 2 MG/ML IJ SOLN
INTRAMUSCULAR | Status: AC
  Filled 2014-06-12: qty 1

## 2014-06-12 MED ORDER — DEXTROSE 5 % IV SOLN
1.0000 g | Freq: Once | INTRAVENOUS | Status: AC
  Administered 2014-06-12: 1 g via INTRAVENOUS
  Filled 2014-06-12: qty 10

## 2014-06-12 MED ORDER — SODIUM CHLORIDE 0.9 % IJ SOLN
3.0000 mL | Freq: Two times a day (BID) | INTRAMUSCULAR | Status: DC
Start: 2014-06-13 — End: 2014-06-22
  Administered 2014-06-14 – 2014-06-21 (×7): 3 mL via INTRAVENOUS

## 2014-06-12 NOTE — H&P (Signed)
PCP:   Purvis Kilts, MD   Chief Complaint:  confused  HPI: 81 yo male h/o mild to mod dementia, s/p cabg end of 2015, s/p nephrectomy for TCC last week at [facility], ckd comes in with family for confusion and agitation today.  Was d/c from [place] about 4 days ago.  Has not really been eating or drinking well.  Not been complaining of any pain but has not been sleeping well either.  No fevers.  No n/v/d.  Has foley cath in since surgery, family thought there may be something wrong with it so they changed it today, and has been having good uop.  He has fallen several times since hes been home and is generally getting weaker.  He was on abx perioperatively but not sent home on any as his cultures were all negative.  He had most recent urine culture this past Monday which family reports was negative.  No focal neurological deficits.  No swelling in his legs.  Still has staples to surgical sites on abdomen which have been well healing, no discharge, no other rashes.  Asked to admit for his delirium/encephalopathy.  No coughing.  No sob.  Review of Systems:  Positive and negative as per HPI otherwise all other systems are negative per family other than HPI  Past Medical History: Past Medical History  Diagnosis Date  . Chronic constipation   . GERD (gastroesophageal reflux disease)   . Arthritis   . Hyperlipidemia   . Orthostatic hypotension 11/19/2013  . Coronary artery disease     CARDIOLOGIST-  DR BERRY  --  HX  STEMI  12-17-2013  S/P  CABG X4  12-18-2013  . Mild dementia   . Type 2 diabetes mellitus   . Hypothyroidism   . History of esophageal dilatation   . History of gastritis   . History of hiatal hernia   . History of bladder cancer     hx  TCC low grade  . Prostate cancer     low-grade  . Kidney tumor     right  . BPH (benign prostatic hyperplasia)   . CKD (chronic kidney disease) stage 3, GFR 30-59 ml/min   . History of kidney stones   . ED (erectile dysfunction)    . S/P CABG x 4     12-18-2013  . Bladder tumor   . Complication of anesthesia     HARD TO WAKE  . OSA on CPAP     mild osa   per sleep study 07-11-2009- patient states does not use regularly   Past Surgical History  Procedure Laterality Date  . Upper gastrointestinal endoscopy  04/06/2010  &  01-08-2013    w/ esophageal dilatation  . Hemorrhoid surgery    . Hand surgery      right  . Wrist fracture surgery      pins placed-left  . Cystoscopy with biopsy  01/09/2012    Procedure: CYSTOSCOPY WITH BIOPSY;  Surgeon: Marissa Nestle, MD;  Location: [facility] ORS;  Service: Urology;  Laterality: N/A;  Cystoscopy with Multiple Bladder Biopsies  . Esophagogastroduodenoscopy (egd) with esophageal dilation N/A 01/08/2013    Procedure: ESOPHAGOGASTRODUODENOSCOPY (EGD) WITH ESOPHAGEAL DILATION;  Surgeon: Rogene Houston, MD;  Location: [facility];  Service: Endoscopy;  Laterality: N/A;  120  . Left heart cath N/A 12/17/2013    Procedure: LEFT HEART CATH;  Surgeon: Leonie Man, MD;  Location: [facility] CATH LAB;  Service: Cardiovascular;  Laterality: N/A;  .  Left heart catheterization with coronary angiogram N/A 12/17/2013    Procedure: LEFT HEART CATHETERIZATION WITH CORONARY ANGIOGRAM;  Surgeon: Leonie Man, MD;  Location: [facility] CATH LAB;  Service: Cardiovascular;  Laterality: N/A;  . Colonoscopy with esophagogastroduodenoscopy (egd)  08-15-2006    w/ esophageal dilatation  . Umbilical hernia repair  04-24-2003  . Transurethral resection of bladder tumor  06-14-2004  . Transurethral resection of prostate  10-04-2004  . Cysto/  removal bladder calculus/  resection bladder tumor  10-16-2006  . Cystostomy w/ bladder biopsy  01-09-2012  . Cardiac catheterization  12-17-2013   dr Shanon Brow harding    severe disease RCA with thrombotic appearing subtotal occlusions/  95-99%  OM2/  diffuse disease LAD  and D1/  mild to moderate basal to mid inferior hypokinesis/  severe elevated LVEDP/  ef 45-50%  . Coronary  artery bypass graft N/A 12/18/2013    Procedure: Coronary artery bypass grafting times four using left internal mammary artery and endoscopically harvested right saphenous vein graft;  Surgeon: Gaye Pollack, MD;  Location: [facility] OR;  Service: Open Heart Surgery;  Laterality: N/A;  . Transthoracic echocardiogram  11-14-2012    grade I diastolic dysfunction/  ef 55-60%/  trivial MR  and TR  . Cardiovascular stress test  08-09-2009  dr berry    mild to moderate perfusion defect in basal , mid, and apical inferior regions consistent with an infart/scar/   No evidence ischemia/  ef 51%/  no ECG changes/   low risk scan  . Transurethral resection of bladder tumor N/A 04/07/2014    Procedure: TRANSURETHRAL RESECTION OF BLADDER TUMOR (TURBT);  Surgeon: Arvil Persons, MD;  Location: [facility];  Service: Urology;  Laterality: N/A;  . Cystoscopy/retrograde/ureteroscopy Right 04/07/2014    Procedure: CYSTOSCOPY/RETROGRADE/URETEROSCOPY WITH BIOPSY OF RENAL PELVIS TUMOR;  Surgeon: Arvil Persons, MD;  Location: [facility];  Service: Urology;  Laterality: Right;  . Robot assisted laparoscopic nephrectomy Right 06/05/2014    Procedure: ROBOTIC ASSISTED LAPAROSCOPIC RIGHT NEPHROURETERECTOMY, VENTRAL HERNIA REPAIR ;  Surgeon: Ardis Hughs, MD;  Location: [facility] ORS;  Service: Urology;  Laterality: Right;  . Cystoscopy w/ ureteral stent placement Right 06/05/2014    Procedure: CYSTOSCOPY ;  Surgeon: Ardis Hughs, MD;  Location: [facility] ORS;  Service: Urology;  Laterality: Right;    Medications: Prior to Admission medications   Medication Sig Start Date End Date Taking? Authorizing Provider  aspirin EC 325 MG EC tablet Take 1 tablet (325 mg total) by mouth daily. 12/24/13  Yes Donielle Liston Alba, PA-C  diphenhydramine-acetaminophen (TYLENOL PM) 25-500 MG TABS Take 2 tablets by mouth at bedtime as needed (FOR SLEEP).   Yes Historical Provider, MD  donepezil (ARICEPT) 10 MG tablet Take 10  mg by mouth at bedtime.   Yes Historical Provider, MD  escitalopram (LEXAPRO) 20 MG tablet Take 20 mg by mouth daily.   Yes Historical Provider, MD  fludrocortisone (FLORINEF) 0.1 MG tablet TAKE TWO (2) TABLETS BY MOUTH DAILY 04/29/14  Yes Lorretta Harp, MD  HYDROcodone-acetaminophen (NORCO/VICODIN) 5-325 MG per tablet Take 1-2 tablets by mouth every 6 (six) hours as needed. 06/08/14  Yes Ardis Hughs, MD  levothyroxine (SYNTHROID, LEVOTHROID) 125 MCG tablet Take 125 mcg by mouth daily before breakfast.   Yes Historical Provider, MD  midodrine (PROAMATINE) 10 MG tablet Take 1 tablet (10 mg total) by mouth 2 (two) times daily with breakfast and lunch. 04/27/14  Yes Lorretta Harp, MD  pantoprazole (PROTONIX) 40 MG tablet TAKE ONE TABLET EVERY MORNING 04/06/14  Yes Butch Penny, NP  polyethylene glycol (MIRALAX / GLYCOLAX) packet Take 17 g by mouth at bedtime.   Yes Historical Provider, MD  solifenacin (VESICARE) 5 MG tablet Take 5 mg by mouth daily as needed (TAKES AT BEDTIME AS NEEDED FOR URINATION).    Yes Historical Provider, MD    Allergies:   Allergies  Allergen Reactions  . Statins Other (See Comments)    MUSCLE ACHES    Social History:  reports that he has quit smoking. His smoking use included Cigarettes. He has a 4 pack-year smoking history. He quit smokeless tobacco use about 54 years ago. His smokeless tobacco use included Chew. He reports that he does not drink alcohol or use illicit drugs.  Family History: Family History  Problem Relation Age of Onset  . Cancer - Lung Brother     Physical Exam: Filed Vitals:   06/12/14 1751 06/12/14 1929 06/12/14 1930  BP: 146/69 153/77 150/73  Pulse: 72 71 70  Temp: 99.4 F (37.4 C) 100.3 F (37.9 C)   TempSrc: Oral Rectal   Resp: 20 18 20   Height: 5\' 10"  (1.778 m)    Weight: 105.235 kg (232 lb)    SpO2: 100% 96% 96%   General appearance: alert, cooperative and delirious Head: Normocephalic, without obvious  abnormality, atraumatic Eyes: negative Nose: Nares normal. Septum midline. Mucosa normal. No drainage or sinus tenderness. Neck: no JVD and supple, symmetrical, trachea midline Lungs: clear to auscultation bilaterally Heart: regular rate and rhythm, S1, S2 normal, no murmur, click, rub or gallop Abdomen: soft, non-tender; bowel sounds normal; no masses,  no organomegaly Extremities: extremities normal, atraumatic, no cyanosis or edema Pulses: 2+ and symmetric Skin: Skin color, texture, turgor normal. No rashes or lesions  Surgical sites on abd with staples mild irritation no signs of infection Neurologic: Grossly normal   Labs on Admission:   Recent Labs  06/12/14 1854  NA 137  K 3.2*  CL 102  CO2 26  GLUCOSE 96  BUN 26*  CREATININE 2.15*  CALCIUM 9.0    Recent Labs  06/12/14 1854  WBC 11.1*  NEUTROABS 7.6  HGB 10.4*  HCT 32.6*  MCV 87.9  PLT 260    Recent Labs  06/12/14 1854  TROPONINI 0.31*   Radiological Exams on Admission: Dg Chest 2 View  06/12/2014   CLINICAL DATA:  Acute onset of altered mental status. Initial encounter.  EXAM: CHEST  2 VIEW  COMPARISON:  Chest radiograph performed 01/28/2014  FINDINGS: The lungs are well-aerated. Mild peribronchial thickening is noted. There is no evidence of focal opacification, pleural effusion or pneumothorax.  The heart is borderline normal in size. The patient is status post median sternotomy. No acute osseous abnormalities are seen.  IMPRESSION: Mild peribronchial thickening noted; lungs otherwise clear.   Electronically Signed   By: Garald Balding M.D.   On: 06/12/2014 20:41   Ct Head Wo Contrast  06/12/2014   CLINICAL DATA:  Confusion. Hallucination. Recent nephrectomy. Bladder cancer. Mild dementia.  EXAM: CT HEAD WITHOUT CONTRAST  TECHNIQUE: Contiguous axial images were obtained from the base of the skull through the vertex without intravenous contrast.  COMPARISON:  01/23/2014  FINDINGS: Sinuses/Soft tissues: Motion  degradation. Mucosal thickening of bilateral maxillary sinuses. Fluid in the right frontal sinus. Clear mastoid air cells.  Intracranial: Mild low density in the periventricular white matter likely related to small vessel disease. Cerebral volume loss which is  normal for age. No mass lesion, hemorrhage, hydrocephalus, acute infarct, intra-axial, or extra-axial fluid collection.  IMPRESSION: 1.  No acute intracranial abnormality. 2. Sinus disease. 3. Mild small vessel ischemic change.   Electronically Signed   By: Abigail Miyamoto M.D.   On: 06/12/2014 20:27    Assessment/Plan  81 yo male with acute delirium/encephalopathy with recent nephrectomy and h/o mild dementia  Principal Problem:   Metabolic encephalopathy/delirium-  Suspect this is multifactorial.  May be starting with mild uti, plus/minus some dehydration.  abd exam is benign.  Will treat uti, give ivf.  Will transfer to [facility] so that urology can also evaluate and be involved but doubt postsurgical complication.  Active Problems:   S/P CABG x 4-  Trop mildly elevated.  May be due to worsening renal function.  Serial trop.  Tele monitoring.   Dementia without behavioral disturbance-  May be component of current deliriium   DM type 2 (diabetes mellitus, type 2)   Transitional cell carcinoma of kidney   S/p nephrectomy-  Transfer to [facility] for urology evaluation also   Acute renal failure-  Over the last week his renal function has worsening, likely prerenal.  Ivf.  tx for possible uti.   UTI (lower urinary tract infection)-  Urine cx sent.  Cover with iv rocephin   Elevated troponin  As above  Admit to tele [facility] for urology evaluation in this postoperative patient.  Full code.  Maximo Spratling A 06/12/2014, 9:22 PM

## 2014-06-12 NOTE — ED Notes (Addendum)
Family states patient was released from hospital on Monday from having kidney removed. States patient has been hallucinating and confused since discharge, but today has become increasingly confused and combative. Patient attempting to drink from O2 sensor in triage. Patient has urinary catheter in place with approximately 50 ml urine output, states that is total output since 0930 this morning.

## 2014-06-12 NOTE — ED Provider Notes (Signed)
CSN: [id number]     Arrival date & time 06/12/14  1741 History   First MD Initiated Contact with Patient 06/12/14 1809     Chief Complaint  Patient presents with  . Altered Mental Status     Patient is a 81 y.o. male presenting with altered mental status. The history is provided by a caregiver, a relative and the patient. The history is limited by the condition of the patient (Hx dementia, AMS).  Altered Mental Status Pt was seen at 1820. Per pt's family: Family states pt has been increasingly confused since being discharged from [facility] 4 days ago s/p right nephrectomy. Pt's family states pt "keeps falling down" and having worsening confusion from his baseline dementia. Family states pt has had decreased urine output today (new foley bag put on at 0900, has had approximately 29ml urine output for the day). Pt's family states he also has been "hallucinating" and occasionally "combative." Pt has not taken his pain meds x4 days, per family. No new meds, per family. Per Triage RN: pt was attempting to drink from the O2 sensor while in Triage room. Pt himself states he is "ok." Denies CP/SOB, no abd pain, no N/V/D, no home fevers, no focal motor weakness, no syncope.    Past Medical History  Diagnosis Date  . Chronic constipation   . GERD (gastroesophageal reflux disease)   . Arthritis   . Hyperlipidemia   . Orthostatic hypotension 11/19/2013  . Coronary artery disease     CARDIOLOGIST-  DR BERRY  --  HX  STEMI  12-17-2013  S/P  CABG X4  12-18-2013  . Mild dementia   . Type 2 diabetes mellitus   . Hypothyroidism   . History of esophageal dilatation   . History of gastritis   . History of hiatal hernia   . History of bladder cancer     hx  TCC low grade  . Prostate cancer     low-grade  . Kidney tumor     right  . BPH (benign prostatic hyperplasia)   . CKD (chronic kidney disease) stage 3, GFR 30-59 ml/min   . History of kidney stones   . ED (erectile dysfunction)   . S/P CABG x 4      12-18-2013  . Bladder tumor   . Complication of anesthesia     HARD TO WAKE  . OSA on CPAP     mild osa   per sleep study 07-11-2009- patient states does not use regularly   Past Surgical History  Procedure Laterality Date  . Upper gastrointestinal endoscopy  04/06/2010  &  01-08-2013    w/ esophageal dilatation  . Hemorrhoid surgery    . Hand surgery      right  . Wrist fracture surgery      pins placed-left  . Cystoscopy with biopsy  01/09/2012    Procedure: CYSTOSCOPY WITH BIOPSY;  Surgeon: Marissa Nestle, MD;  Location: [facility];  Service: Urology;  Laterality: N/A;  Cystoscopy with Multiple Bladder Biopsies  . Esophagogastroduodenoscopy (egd) with esophageal dilation N/A 01/08/2013    Procedure: ESOPHAGOGASTRODUODENOSCOPY (EGD) WITH ESOPHAGEAL DILATION;  Surgeon: Rogene Houston, MD;  Location: [facility] END[facility] SUITE;  Service: Endoscopy;  Laterality: N/A;  120  . Left heart cath N/A 12/17/2013    Procedure: LEFT HEART CATH;  Surgeon: Leonie Man, MD;  Location[facility] CATH LAB;  Service: Cardiovascular;  Laterality: N/A;  . Left heart catheterization with coronary angiogram N/A 12/17/2013  Procedure: LEFT HEART CATHETERIZATION WITH CORONARY ANGIOGRAM;  Surgeon: Leonie Man, MD;  Location: [facility] CATH LAB;  Service: Cardiovascular;  Laterality: N/A;  . Colonoscopy with esophagogastroduodenoscopy (egd)  08-15-2006    w/ esophageal dilatation  . Umbilical hernia repair  04-24-2003  . Transurethral resection of bladder tumor  06-14-2004  . Transurethral resection of prostate  10-04-2004  . Cysto/  removal bladder calculus/  resection bladder tumor  10-16-2006  . Cystostomy w/ bladder biopsy  01-09-2012  . Cardiac catheterization  12-17-2013   dr Shanon Brow harding    severe disease RCA with thrombotic appearing subtotal occlusions/  95-99%  OM2/  diffuse disease LAD  and D1/  mild to moderate basal to mid inferior hypokinesis/  severe elevated LVEDP/  ef 45-50%  . Coronary artery bypass graft  N/A 12/18/2013    Procedure: Coronary artery bypass grafting times four using left internal mammary artery and endoscopically harvested right saphenous vein graft;  Surgeon: Gaye Pollack, MD;  Location: [facility] OR;  Service: Open Heart Surgery;  Laterality: N/A;  . Transthoracic echocardiogram  11-14-2012    grade I diastolic dysfunction/  ef 55-60%/  trivial MR  and TR  . Cardiovascular stress test  08-09-2009  dr berry    mild to moderate perfusion defect in basal , mid, and apical inferior regions consistent with an infart/scar/   No evidence ischemia/  ef 51%/  no ECG changes/   low risk scan  . Transurethral resection of bladder tumor N/A 04/07/2014    Procedure: TRANSURETHRAL RESECTION OF BLADDER TUMOR (TURBT);  Surgeon: Arvil Persons, MD;  Location: [facility];  Service: Urology;  Laterality: N/A;  . Cystoscopy/retrograde/ureteroscopy Right 04/07/2014    Procedure: CYSTOSCOPY/RETROGRADE/URETEROSCOPY WITH BIOPSY OF RENAL PELVIS TUMOR;  Surgeon: Arvil Persons, MD;  Location: [facility];  Service: Urology;  Laterality: Right;  . Robot assisted laparoscopic nephrectomy Right 06/05/2014    Procedure: ROBOTIC ASSISTED LAPAROSCOPIC RIGHT NEPHROURETERECTOMY, VENTRAL HERNIA REPAIR ;  Surgeon: Ardis Hughs, MD;  Location: [facility] ORS;  Service: Urology;  Laterality: Right;  . Cystoscopy w/ ureteral stent placement Right 06/05/2014    Procedure: CYSTOSCOPY ;  Surgeon: Ardis Hughs, MD;  Location: [facility] ORS;  Service: Urology;  Laterality: Right;   Family History  Problem Relation Age of Onset  . Cancer - Lung Brother    History  Substance Use Topics  . Smoking status: Former Smoker -- 1.00 packs/day for 4 years    Types: Cigarettes  . Smokeless tobacco: Former Systems developer    Types: [place] date: 01/03/1960  . Alcohol Use: No    Review of Systems  Unable to perform ROS: Dementia      Allergies  Statins  Home Medications   Prior to Admission medications    Medication Sig Start Date End Date Taking? Authorizing Provider  aspirin EC 325 MG EC tablet Take 1 tablet (325 mg total) by mouth daily. 12/24/13   Donielle Liston Alba, PA-C  donepezil (ARICEPT) 10 MG tablet Take 10 mg by mouth at bedtime.    Historical Provider, MD  escitalopram (LEXAPRO) 10 MG tablet Take 10 mg by mouth every morning.  02/23/14   Historical Provider, MD  fludrocortisone (FLORINEF) 0.1 MG tablet TAKE TWO (2) TABLETS BY MOUTH DAILY 04/29/14   Lorretta Harp, MD  HYDROcodone-acetaminophen (NORCO/VICODIN) 5-325 MG per tablet Take 1-2 tablets by mouth every 6 (six) hours as needed. 06/08/14   Ardis Hughs, MD  levothyroxine (SYNTHROID, LEVOTHROID) 125 MCG tablet Take 125 mcg by mouth daily before breakfast.    Historical Provider, MD  midodrine (PROAMATINE) 10 MG tablet Take 1 tablet (10 mg total) by mouth 2 (two) times daily with breakfast and lunch. 04/27/14   Lorretta Harp, MD  pantoprazole (PROTONIX) 40 MG tablet TAKE ONE TABLET EVERY MORNING Patient not taking: Reported on 05/27/2014 04/06/14   Butch Penny, NP  pantoprazole (PROTONIX) 40 MG tablet Take 1 tablet (40 mg total) by mouth every morning. ACID REFLUX/ GERD 04/28/14   Butch Penny, NP  polyethylene glycol (MIRALAX / GLYCOLAX) packet Take 17 g by mouth at bedtime.    Historical Provider, MD  solifenacin (VESICARE) 5 MG tablet Take 5 mg by mouth daily as needed.     Historical Provider, MD   BP 146/69 mmHg  Pulse 72  Temp(Src) 99.4 F (37.4 C) (Oral)  Resp 20  Ht 5\' 10"  (1.778 m)  Wt 232 lb (105.235 kg)  BMI 33.29 kg/m2  SpO2 100% Physical Exam  1825: Physical examination:  Nursing notes reviewed; Vital signs and O2 SAT reviewed;  Constitutional: Well developed, Well nourished, In no acute distress; Head:  Normocephalic, atraumatic; Eyes: EOMI, PERRL, No scleral icterus; ENMT: Mouth and pharynx normal, Mucous membranes dry; Neck: Supple, Full range of motion, No lymphadenopathy; Cardiovascular: Regular  rate and rhythm, No gallop; Respiratory: Breath sounds clear & equal bilaterally, No rales, rhonchi, wheezes.  Speaking full sentences with ease, Normal respiratory effort/excursion; Chest: Nontender, Movement normal; Abdomen: Soft, Nontender, Nondistended, Normal bowel sounds. +healing surgical wounds to abd wall, staples intact with mild erythema at staples sites, NT to palp, no drainage; Genitourinary: No CVA tenderness. +foley in place with hazy yellow urine in bag, no hematuria, no blood clots.; Extremities: Pulses normal, No tenderness, +1 pedal edema bilat.; Neuro: Awake, alert, confused per hx dementia. No facial droop. Major CN grossly intact.  Speech clear. Moves all extremities spontaneously and to command without apparent gross focal motor deficits.; Skin: Color normal, Warm, Dry.   ED Course  Procedures     EKG Interpretation   Date/Time:  Friday June 12 2014 18:49:17 EST Ventricular Rate:  71 PR Interval:  152 QRS Duration: 104 QT Interval:  468 QTC Calculation: 508 R Axis:   63 Text Interpretation:  Normal sinus rhythm Cannot rule out Inferior infarct  , age undetermined Cannot rule out Anterior infarct , age undetermined  Nonspecific ST and T wave abnormality Prolonged QT Abnormal ECG When  compared with ECG of 01/24/2014 QT has lengthened and Nonspecific ST and T  wave abnormality is now Present Confirmed by [facility]  MD, Nunzio Cory  [phone]) on 06/12/2014 7:09:00 PM      MDM  MDM Reviewed: previous chart, nursing note and vitals Reviewed previous: labs and ECG Interpretation: labs, ECG, x-ray and CT scan Total time providing critical care: 30-74 minutes. This excludes time spent performing separately reportable procedures and services. Consults: admitting MD   CRITICAL CARE Performed by: Alfonzo Feller Total critical care time: 35 Critical care time was exclusive of separately billable procedures and treating other patients. Critical care was necessary to  treat or prevent imminent or life-threatening deterioration. Critical care was time spent personally by me on the following activities: development of treatment plan with patient and/or surrogate as well as nursing, discussions with consultants, evaluation of patient's response to treatment, examination of patient, obtaining history from patient or surrogate, ordering and performing treatments and interventions, ordering and review of laboratory  studies, ordering and review of radiographic studies, pulse oximetry and re-evaluation of patient's condition.    Results for orders placed or performed during the hospital encounter of 06/12/14  Urinalysis, Routine w reflex microscopic  Result Value Ref Range   Color, Urine YELLOW YELLOW   APPearance HAZY (A) CLEAR   Specific Gravity, Urine 1.015 1.005 - 1.030   pH 6.5 5.0 - 8.0   Glucose, UA NEGATIVE NEGATIVE mg/dL   Hgb urine dipstick LARGE (A) NEGATIVE   Bilirubin Urine NEGATIVE NEGATIVE   Ketones, ur NEGATIVE NEGATIVE mg/dL   Protein, ur 30 (A) NEGATIVE mg/dL   Urobilinogen, UA 0.2 0.0 - 1.0 mg/dL   Nitrite NEGATIVE NEGATIVE   Leukocytes, UA MODERATE (A) NEGATIVE  CBC with Differential  Result Value Ref Range   WBC 11.1 (H) 4.0 - 10.5 K/uL   RBC 3.71 (L) 4.22 - 5.81 MIL/uL   Hemoglobin 10.4 (L) 13.0 - 17.0 g/dL   HCT 32.6 (L) 39.0 - 52.0 %   MCV 87.9 78.0 - 100.0 fL   MCH 28.0 26.0 - 34.0 pg   MCHC 31.9 30.0 - 36.0 g/dL   RDW 15.4 11.5 - 15.5 %   Platelets 260 150 - 400 K/uL   Neutrophils Relative % 68 43 - 77 %   Neutro Abs 7.6 1.7 - 7.7 K/uL   Lymphocytes Relative 15 12 - 46 %   Lymphs Abs 1.7 0.7 - 4.0 K/uL   Monocytes Relative 9 3 - 12 %   Monocytes Absolute 0.9 0.1 - 1.0 K/uL   Eosinophils Relative 7 (H) 0 - 5 %   Eosinophils Absolute 0.8 (H) 0.0 - 0.7 K/uL   Basophils Relative 1 0 - 1 %   Basophils Absolute 0.1 0.0 - 0.1 K/uL  Troponin I  Result Value Ref Range   Troponin I 0.31 (H) <0.031 ng/mL  Basic metabolic panel   Result Value Ref Range   Sodium 137 135 - 145 mmol/L   Potassium 3.2 (L) 3.5 - 5.1 mmol/L   Chloride 102 96 - 112 mmol/L   CO2 26 19 - 32 mmol/L   Glucose, Bld 96 70 - 99 mg/dL   BUN 26 (H) 6 - 23 mg/dL   Creatinine, Ser 2.15 (H) 0.50 - 1.35 mg/dL   Calcium 9.0 8.4 - 10.5 mg/dL   GFR calc non Af Amer 28 (L) >90 mL/min   GFR calc Af Amer 32 (L) >90 mL/min   Anion gap 9 5 - 15  Lactic acid, plasma  Result Value Ref Range   Lactic Acid, Venous 1.3 0.5 - 2.0 mmol/L  Urine microscopic-add on  Result Value Ref Range   Squamous Epithelial / LPF FEW (A) RARE   WBC, UA TOO NUMEROUS TO COUNT <3 WBC/hpf   RBC / HPF TOO NUMEROUS TO COUNT <3 RBC/hpf   Bacteria, UA FEW (A) RARE   Dg Chest 2 View 06/12/2014   CLINICAL DATA:  Acute onset of altered mental status. Initial encounter.  EXAM: CHEST  2 VIEW  COMPARISON:  Chest radiograph performed 01/28/2014  FINDINGS: The lungs are well-aerated. Mild peribronchial thickening is noted. There is no evidence of focal opacification, pleural effusion or pneumothorax.  The heart is borderline normal in size. The patient is status post median sternotomy. No acute osseous abnormalities are seen.  IMPRESSION: Mild peribronchial thickening noted; lungs otherwise clear.   Electronically Signed   By: Garald Balding M.D.   On: 06/12/2014 20:41   Ct Head Wo Contrast 06/12/2014  CLINICAL DATA:  Confusion. Hallucination. Recent nephrectomy. Bladder cancer. Mild dementia.  EXAM: CT HEAD WITHOUT CONTRAST  TECHNIQUE: Contiguous axial images were obtained from the base of the skull through the vertex without intravenous contrast.  COMPARISON:  01/23/2014  FINDINGS: Sinuses/Soft tissues: Motion degradation. Mucosal thickening of bilateral maxillary sinuses. Fluid in the right frontal sinus. Clear mastoid air cells.  Intracranial: Mild low density in the periventricular white matter likely related to small vessel disease. Cerebral volume loss which is normal for age. No mass lesion,  hemorrhage, hydrocephalus, acute infarct, intra-axial, or extra-axial fluid collection.  IMPRESSION: 1.  No acute intracranial abnormality. 2. Sinus disease. 3. Mild small vessel ischemic change.   Electronically Signed   By: Abigail Miyamoto M.D.   On: 06/12/2014 20:27     2115:  Pt not orthostatic on VS. Rectal temp 100.3; BC x2 pending. Possible UTI; will tx with IV rocephin while UC is pending. Bladder scan with 100+ml urine in bladder; will gently irrigate. Troponin elevated and EKG with NS STTW changes. Pt denies CP. Will obtain 2nd troponin. BUN/Cr elevated from baseline; will continue judicious IVF. Dx and testing d/w pt and family.  Questions answered.  Verb understanding, agreeable to admit.   T/C to [facility] Dr. Shanon Brow, case discussed, including:  HPI, pertinent PM/SHx, VS/PE, dx testing, ED course and treatment:  Agreeable to admit/transfer to [facility], requests to write temporary orders, obtain tele bed to [facility] Dr. Arnoldo Morale.   Francine Graven, DO 06/14/14 2121

## 2014-06-12 NOTE — ED Notes (Signed)
Patient presented to ER family states that patient was d/ced from hospital 6 days age and has had increase in confusion and a fall today. Patient has a foley catheter in place with scant amount of urine noted family states that is his total output for the day. Family at bedside patient calm at present. Family states that he has been combative today

## 2014-06-12 NOTE — ED Notes (Signed)
DELAY IN EKG DUE TO MONITOR NOT WORKING PROPERLY.

## 2014-06-13 ENCOUNTER — Encounter (HOSPITAL_COMMUNITY): Payer: Self-pay | Admitting: *Deleted

## 2014-06-13 ENCOUNTER — Inpatient Hospital Stay (HOSPITAL_COMMUNITY): Payer: Medicare Other

## 2014-06-13 LAB — COMPREHENSIVE METABOLIC PANEL
ALK PHOS: 53 U/L (ref 39–117)
ALT: 16 U/L (ref 0–53)
AST: 35 U/L (ref 0–37)
Albumin: 3.2 g/dL — ABNORMAL LOW (ref 3.5–5.2)
Anion gap: 4 — ABNORMAL LOW (ref 5–15)
BILIRUBIN TOTAL: 0.7 mg/dL (ref 0.3–1.2)
BUN: 24 mg/dL — AB (ref 6–23)
CO2: 30 mmol/L (ref 19–32)
Calcium: 8.4 mg/dL (ref 8.4–10.5)
Chloride: 104 mmol/L (ref 96–112)
Creatinine, Ser: 2.07 mg/dL — ABNORMAL HIGH (ref 0.50–1.35)
GFR calc Af Amer: 34 mL/min — ABNORMAL LOW (ref >90)
GFR, EST NON AFRICAN AMERICAN: 29 mL/min — AB (ref >90)
Glucose, Bld: 93 mg/dL (ref 70–99)
POTASSIUM: 3.6 mmol/L (ref 3.5–5.1)
Sodium: 138 mmol/L (ref 135–145)
Total Protein: 6.2 g/dL (ref 6.0–8.3)

## 2014-06-13 LAB — AMMONIA: AMMONIA: 12 umol/L (ref 11–32)

## 2014-06-13 LAB — BLOOD GAS, ARTERIAL
ACID-BASE EXCESS: 0.8 mmol/L (ref 0.0–2.0)
ACID-BASE EXCESS: 1.7 mmol/L (ref 0.0–2.0)
BICARBONATE: 24.5 meq/L — AB (ref 20.0–24.0)
Bicarbonate: 25.2 mEq/L — ABNORMAL HIGH (ref 20.0–24.0)
DRAWN BY: 308601
Drawn by: 270211
FIO2: 1 %
FIO2: 0.21 %
O2 Saturation: 98.9 %
O2 Saturation: 95.5 %
PATIENT TEMPERATURE: 98.6
PCO2 ART: 41.7 mmHg (ref 35.0–45.0)
PCO2 ART: 33.1 mmHg — AB (ref 35.0–45.0)
PH ART: 7.398 (ref 7.350–7.450)
PO2 ART: 227 mmHg — AB (ref 80.0–100.0)
Patient temperature: 98.6
TCO2: 23.4 mmol/L (ref 0–100)
TCO2: 22.4 mmol/L (ref 0–100)
pH, Arterial: 7.482 — ABNORMAL HIGH (ref 7.350–7.450)
pO2, Arterial: 75.6 mmHg — ABNORMAL LOW (ref 80.0–100.0)

## 2014-06-13 LAB — TROPONIN I
TROPONIN I: 0.2 ng/mL — AB (ref <0.031)
TROPONIN I: 0.23 ng/mL — AB (ref <0.031)
TROPONIN I: 0.25 ng/mL — AB (ref <0.031)

## 2014-06-13 LAB — CREATININE, URINE, RANDOM: Creatinine, Urine: 131.77 mg/dL

## 2014-06-13 LAB — TSH: TSH: 13.484 u[IU]/mL — AB (ref 0.350–4.500)

## 2014-06-13 LAB — GLUCOSE, CAPILLARY
Glucose-Capillary: 93 mg/dL (ref 70–99)
Glucose-Capillary: 90 mg/dL (ref 70–99)

## 2014-06-13 LAB — SODIUM, URINE, RANDOM: SODIUM UR: 102 mmol/L

## 2014-06-13 MED ORDER — HALOPERIDOL LACTATE 5 MG/ML IJ SOLN
5.0000 mg | Freq: Once | INTRAMUSCULAR | Status: AC
  Administered 2014-06-13: 5 mg via INTRAVENOUS
  Filled 2014-06-13: qty 1

## 2014-06-13 MED ORDER — PANTOPRAZOLE SODIUM 20 MG PO TBEC
20.0000 mg | DELAYED_RELEASE_TABLET | Freq: Every morning | ORAL | Status: DC
  Administered 2014-06-14 – 2014-06-22 (×9): 20 mg via ORAL
  Filled 2014-06-13 (×12): qty 1

## 2014-06-13 MED ORDER — POTASSIUM CHLORIDE IN NACL 20-0.9 MEQ/L-% IV SOLN
INTRAVENOUS | Status: AC
  Administered 2014-06-13: 21:00:00 via INTRAVENOUS
  Filled 2014-06-13: qty 1000

## 2014-06-13 MED ORDER — LEVOTHYROXINE SODIUM 25 MCG PO TABS
150.0000 ug | ORAL_TABLET | Freq: Every day | ORAL | Status: DC
  Administered 2014-06-14 – 2014-06-22 (×9): 150 ug via ORAL
  Filled 2014-06-13: qty 2
  Filled 2014-06-13: qty 1
  Filled 2014-06-13 (×3): qty 2
  Filled 2014-06-13: qty 1
  Filled 2014-06-13: qty 2
  Filled 2014-06-13 (×4): qty 1
  Filled 2014-06-13: qty 2
  Filled 2014-06-13 (×2): qty 1
  Filled 2014-06-13 (×2): qty 2
  Filled 2014-06-13: qty 1
  Filled 2014-06-13: qty 2

## 2014-06-13 MED ORDER — DONEPEZIL HCL 5 MG PO TABS
5.0000 mg | ORAL_TABLET | Freq: Every day | ORAL | Status: DC
  Administered 2014-06-13: 5 mg via ORAL
  Filled 2014-06-13: qty 1

## 2014-06-13 MED ORDER — HYDROCORTISONE NA SUCCINATE PF 100 MG IJ SOLR
50.0000 mg | Freq: Three times a day (TID) | INTRAMUSCULAR | Status: DC
Start: 2014-06-13 — End: 2014-06-14
  Administered 2014-06-13 – 2014-06-14 (×4): 50 mg via INTRAVENOUS
  Filled 2014-06-13 (×7): qty 1

## 2014-06-13 NOTE — Progress Notes (Signed)
RT called to bedside to assess Pt and his O2 needs.  Upon arrival Pt has snoring respirations with a RR of 20 and sating 100% on a NRB.  Pt does not follow commands and is hard to arouse.  ABG obtained per RR Protocol. NP placed order for Pt to be placed on CPAP per home use.  RT placed Pt on CPAP at 10 CMH20 via FFM with 6 LPM O2 bleed in.  Pt tolerating well at this time, RT to monitor and assess as needed.

## 2014-06-13 NOTE — Significant Event (Signed)
FeNa 1.2-->ATN Renal US essentially neg Ammonia neg TSH ? --Increased synthroig Still agitated but more "with it" per family at bedisde Get ABG r/o hypercarbia [h/o OhSS] Start nightly CPAP per RT  I am hopeful for recovery If he continues to decline, we will ask Neuro for an opinion--My thoughts are this is all Metabolic encpehalopathy.  Verneita Griffes, MD [facility] [phone]

## 2014-06-13 NOTE — Progress Notes (Signed)
On transfer from [facility] patient agitated, combative, pulled out IV, hitting at nurses, getting out of bed. Given Haldol 5 mg IV x 1 at 0043. At ~1:15 patient somnolent and decrease in oxygen saturation 85%. Placed on NRB with oxygen saturation improving to 100%.   Physical Exam: Temperature 98.6 degrees Fahrenheit, Pulse 66 bpm (Sinus on EKG), BP 109/68 mmHg, oxygen sat 100% on NRB General: Patient snoring, arousable to tactile stimuli.  Lungs: clear bilaterally Heart : RRR no murmurs, rubs or gallop Abd: SNTND, midline staples intact Ext. Pedal pulses +2, no edema or cyanosis  ABG Ph  7.398   pCO2 arterial 35.0 - 45.0 mmHg 41.7   pO2, Arterial 80.0 - 100.0 mmHg 227.0 (H)   Bicarbonate 20.0 - 24.0 mEq/L 25.2 (H)   TCO2 0 - 100 mmol/L 23.4   Acid-Base Excess 0.0 - 2.0 mmol/L 0.8   O2 Saturation % 98.9   Patient temperature  98.6           Mental status change: hx of dementia / admitted for further evaluation of delirium. CT head w/o abnormality. Much calmer after Haldol. Vital signs including respirations are WNL - Will not reverse at this time. Continue to monitor.  Hypoxia. O2 sat 100% on NRB. ABG good. Patient has hx of sleep apnea and uses CPAP intermittently at home. Will use CPAP at night.

## 2014-06-13 NOTE — Progress Notes (Signed)
Pt placed on CPAP at 10 CMH20 with 6 LPM O2 bleed in via FFM.  Pt tolerating well at this time, RT to monitor and assess as needed.

## 2014-06-13 NOTE — Consult Note (Signed)
Urology Consult  CC: Referring physician: Dr. Verlon Au Reason for referral: Possible association of acute encephalopathy with recent surgery.  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 On 06/05/14 he underwent a laparoscopic right nephroureterectomy and did very well postoperatively. He was discharged home and was doing well 4 days ago. A Foley catheter was left indwelling. He was not maintained on antibiotics as per protocol and according to his family he was doing very well. His urine had remained clear. He was not complaining of any pain and the last time he took any Vicodin was on Monday or Tuesday. He had not been eating or drinking well and apparently had not been sleeping for the past 4 nights. He continues to have a good urine output. He has a history of mild dementia and a recent urine culture performed this past Monday was reportedly negative. He has significant encephalopathy currently and the question was raised as to whether this might be in some way related to his surgery. He has been noted to have an increase in his creatinine. His history was obtained from his family members and the chart. The patient is unable to give any history at this time.  Past Medical History  Diagnosis Date  . Chronic constipation   . GERD (gastroesophageal reflux disease)   . Arthritis   . Hyperlipidemia   . Orthostatic hypotension 11/19/2013  . Coronary artery disease     CARDIOLOGIST-  DR BERRY  --  HX  STEMI  12-17-2013  S/P  CABG X4  12-18-2013  . Mild dementia   . Type 2 diabetes mellitus   . Hypothyroidism   . History of esophageal dilatation   . History of gastritis   . History of hiatal hernia   . History of bladder cancer     hx  TCC low grade  . Prostate cancer     low-grade  . Kidney tumor     right  . BPH (benign  prostatic hyperplasia)   . CKD (chronic kidney disease) stage 3, GFR 30-59 ml/min   . History of kidney stones   . ED (erectile dysfunction)   . S/P CABG x 4     12-18-2013  . Bladder tumor   . Complication of anesthesia     HARD TO WAKE  . OSA on CPAP     mild osa   per sleep study 07-11-2009- patient states does not use regularly   Past Surgical History  Procedure Laterality Date  . Upper gastrointestinal endoscopy  04/06/2010  &  01-08-2013    w/ esophageal dilatation  . Hemorrhoid surgery    . Hand surgery      right  . Wrist fracture surgery      pins placed-left  . Cystoscopy with biopsy  01/09/2012    Procedure: CYSTOSCOPY WITH BIOPSY;  Surgeon: Marissa Nestle, MD;  Location: [facility];  Service: Urology;  Laterality: N/A;  Cystoscopy with Multiple Bladder Biopsies  . Esophagogastroduodenoscopy (egd) with esophageal dilation N/A 01/08/2013    Procedure: ESOPHAGOGASTRODUODENOSCOPY (EGD) WITH ESOPHAGEAL DILATION;  Surgeon: Rogene Houston, MD;  Location: [facility];  Service: Endoscopy;  Laterality: N/A;  120  . Left heart cath N/A 12/17/2013    Procedure: LEFT HEART CATH;  Surgeon: Leonie Man, MD;  Location: [facility] CATH LAB;  Service: Cardiovascular;  Laterality:  N/A;  . Left heart catheterization with coronary angiogram N/A 12/17/2013    Procedure: LEFT HEART CATHETERIZATION WITH CORONARY ANGIOGRAM;  Surgeon: Leonie Man, MD;  Location: [facility] CATH LAB;  Service: Cardiovascular;  Laterality: N/A;  . Colonoscopy with esophagogastroduodenoscopy (egd)  08-15-2006    w/ esophageal dilatation  . Umbilical hernia repair  04-24-2003  . Transurethral resection of bladder tumor  06-14-2004  . Transurethral resection of prostate  10-04-2004  . Cysto/  removal bladder calculus/  resection bladder tumor  10-16-2006  . Cystostomy w/ bladder biopsy  01-09-2012  . Cardiac catheterization  12-17-2013   dr Shanon Brow harding    severe disease RCA with thrombotic appearing subtotal occlusions/   95-99%  OM2/  diffuse disease LAD  and D1/  mild to moderate basal to mid inferior hypokinesis/  severe elevated LVEDP/  ef 45-50%  . Coronary artery bypass graft N/A 12/18/2013    Procedure: Coronary artery bypass grafting times four using left internal mammary artery and endoscopically harvested right saphenous vein graft;  Surgeon: Gaye Pollack, MD;  Location: [facility] OR;  Service: Open Heart Surgery;  Laterality: N/A;  . Transthoracic echocardiogram  11-14-2012    grade I diastolic dysfunction/  ef 55-60%/  trivial MR  and TR  . Cardiovascular stress test  08-09-2009  dr berry    mild to moderate perfusion defect in basal , mid, and apical inferior regions consistent with an infart/scar/   No evidence ischemia/  ef 51%/  no ECG changes/   low risk scan  . Transurethral resection of bladder tumor N/A 04/07/2014    Procedure: TRANSURETHRAL RESECTION OF BLADDER TUMOR (TURBT);  Surgeon: Arvil Persons, MD;  Location: [facility];  Service: Urology;  Laterality: N/A;  . Cystoscopy/retrograde/ureteroscopy Right 04/07/2014    Procedure: CYSTOSCOPY/RETROGRADE/URETEROSCOPY WITH BIOPSY OF RENAL PELVIS TUMOR;  Surgeon: Arvil Persons, MD;  Location: [facility];  Service: Urology;  Laterality: Right;  . Robot assisted laparoscopic nephrectomy Right 06/05/2014    Procedure: ROBOTIC ASSISTED LAPAROSCOPIC RIGHT NEPHROURETERECTOMY, VENTRAL HERNIA REPAIR ;  Surgeon: Ardis Hughs, MD;  Location: [facility];  Service: Urology;  Laterality: Right;  . Cystoscopy w/ ureteral stent placement Right 06/05/2014    Procedure: CYSTOSCOPY ;  Surgeon: Ardis Hughs, MD;  Location: [facility];  Service: Urology;  Laterality: Right;    Medications:  Prior to Admission:  Prescriptions prior to admission  Medication Sig Dispense Refill Last Dose  . aspirin EC 325 MG EC tablet Take 1 tablet (325 mg total) by mouth daily. 30 tablet 0 06/12/2014 at Unknown time  . diphenhydramine-acetaminophen (TYLENOL PM)  25-500 MG TABS Take 2 tablets by mouth at bedtime as needed (FOR SLEEP).   06/11/2014 at Unknown time  . donepezil (ARICEPT) 10 MG tablet Take 10 mg by mouth at bedtime.   06/11/2014 at Unknown time  . escitalopram (LEXAPRO) 20 MG tablet Take 20 mg by mouth daily.   06/12/2014 at Unknown time  . fludrocortisone (FLORINEF) 0.1 MG tablet TAKE TWO (2) TABLETS BY MOUTH DAILY 180 tablet 3 06/12/2014 at Unknown time  . HYDROcodone-acetaminophen (NORCO/VICODIN) 5-325 MG per tablet Take 1-2 tablets by mouth every 6 (six) hours as needed. 30 tablet 0 06/10/2014 at 1100A  . levothyroxine (SYNTHROID, LEVOTHROID) 125 MCG tablet Take 125 mcg by mouth daily before breakfast.   06/12/2014 at Unknown time  . midodrine (PROAMATINE) 10 MG tablet Take 1 tablet (10 mg total) by mouth 2 (two) times daily with  breakfast and lunch. 90 tablet 2 06/12/2014 at Unknown time  . pantoprazole (PROTONIX) 40 MG tablet TAKE ONE TABLET EVERY MORNING 30 tablet 4 06/12/2014 at Unknown time  . polyethylene glycol (MIRALAX / GLYCOLAX) packet Take 17 g by mouth at bedtime.   06/11/2014 at Unknown time  . solifenacin (VESICARE) 5 MG tablet Take 5 mg by mouth daily as needed (TAKES AT BEDTIME AS NEEDED FOR URINATION).    06/11/2014 at Unknown time   Scheduled: . aspirin EC  325 mg Oral Daily  . cefTRIAXone (ROCEPHIN)  IV  1 g Intravenous Q24H  . donepezil  5 mg Oral QHS  . hydrocortisone sod succinate (SOLU-CORTEF) inj  50 mg Intravenous Q8H  . levothyroxine  125 mcg Oral QAC breakfast  . [START ON 06/14/2014] pantoprazole  20 mg Oral q morning - 10a  . sodium chloride  3 mL Intravenous Q12H   Continuous: . 0.9 % NaCl with KCl 20 mEq / L 75 mL/hr at 06/13/14 0042    Allergies:  Allergies  Allergen Reactions  . Statins Other (See Comments)    MUSCLE ACHES    Family History  Problem Relation Age of Onset  . Cancer - Lung Brother     Social History:  reports that he has quit smoking. His smoking use included Cigarettes. He has a 4 pack-year  smoking history. He quit smokeless tobacco use about 54 years ago. His smokeless tobacco use included Chew. He reports that he does not drink alcohol or use illicit drugs.  Review of Systems: Pertinent items are noted in HPI. A comprehensive review of systems was negative except as above however a meaningful review of systems was not obtainable from this patient.   Physical Exam:  Vital signs in last 24 hours: Temp:  [97.5 F (36.4 C)-100.3 F (37.9 C)] 98.3 F (36.8 C) (03/05 0802) Pulse Rate:  [70-80] 80 (03/05 0802) Resp:  [18-22] 20 (03/05 0802) BP: (132-153)/(69-77) 146/75 mmHg (03/05 0802) SpO2:  [96 %-100 %] 97 % (03/05 0802) Weight:  [105.235 kg (232 lb)] 105.235 kg (232 lb) (03/04 1751) General appearance: Agitated in bed with soft restraints in place appears stated age Head: Normocephalic, without obvious abnormality, atraumatic Eyes: conjunctivae/corneas clear. EOM's intact.  Oropharynx: moist mucous membranes Neck: supple, symmetrical, trachea midline Resp: normal respiratory effort Cardio: regular rate and rhythm Back: symmetric, no curvature. ROM normal. No CVA tenderness. GI: soft, non-tender; bowel sounds normal; no masses,  no organomegaly. His skin staples are in place and there is no evidence of infection of any of his port sites or midline incision.   he has no flank ecchymoses or flank tenderness. Male genitalia: penis: normal male phallus with no lesions or discharge.Testes: bilaterally descended with no masses or tenderness. no hernias  Extremities: extremities normal, atraumatic, no cyanosis or edema Skin: Skin color normal. No visible rashes or lesions   Laboratory Data:   Recent Labs  06/12/14 1854  WBC 11.1*  HGB 10.4*  HCT 32.6*   BMET  Recent Labs  06/12/14 1854 06/13/14 0650  NA 137 138  K 3.2* 3.6  CL 102 104  CO2 26 30  GLUCOSE 96 93  BUN 26* 24*  CREATININE 2.15* 2.07*  CALCIUM 9.0 8.4   No results for input(s): LABPT, INR in  the last 72 hours. No results for input(s): LABURIN in the last 72 hours. Results for orders placed or performed during the hospital encounter of 06/12/14  Blood culture (routine x 2)  Status: None (Preliminary result)   Collection Time: 06/12/14  9:25 PM  Result Value Ref Range Status   Specimen Description BLOOD LEFT ARM  Final   Special Requests BOTTLES DRAWN AEROBIC AND ANAEROBIC 6CC  Final   Culture NO GROWTH 1 DAY  Final   Report Status PENDING  Incomplete  Blood culture (routine x 2)     Status: None (Preliminary result)   Collection Time: 06/12/14  9:30 PM  Result Value Ref Range Status   Specimen Description BLOOD LEFT HAND  Final   Special Requests BOTTLES DRAWN AEROBIC AND ANAEROBIC 6CC  Final   Culture NO GROWTH 1 DAY  Final   Report Status PENDING  Incomplete   Creatinine:  Recent Labs  06/07/14 1100 06/12/14 1854 06/13/14 0650  CREATININE 1.90* 2.15* 2.07*    Imaging: Dg Chest 2 View  06/12/2014   CLINICAL DATA:  Acute onset of altered mental status. Initial encounter.  EXAM: CHEST  2 VIEW  COMPARISON:  Chest radiograph performed 01/28/2014  FINDINGS: The lungs are well-aerated. Mild peribronchial thickening is noted. There is no evidence of focal opacification, pleural effusion or pneumothorax.  The heart is borderline normal in size. The patient is status post median sternotomy. No acute osseous abnormalities are seen.  IMPRESSION: Mild peribronchial thickening noted; lungs otherwise clear.   Electronically Signed   By: Garald Balding M.D.   On: 06/12/2014 20:41   Ct Head Wo Contrast  06/12/2014   CLINICAL DATA:  Confusion. Hallucination. Recent nephrectomy. Bladder cancer. Mild dementia.  EXAM: CT HEAD WITHOUT CONTRAST  TECHNIQUE: Contiguous axial images were obtained from the base of the skull through the vertex without intravenous contrast.  COMPARISON:  01/23/2014  FINDINGS: Sinuses/Soft tissues: Motion degradation. Mucosal thickening of bilateral maxillary  sinuses. Fluid in the right frontal sinus. Clear mastoid air cells.  Intracranial: Mild low density in the periventricular white matter likely related to small vessel disease. Cerebral volume loss which is normal for age. No mass lesion, hemorrhage, hydrocephalus, acute infarct, intra-axial, or extra-axial fluid collection.  IMPRESSION: 1.  No acute intracranial abnormality. 2. Sinus disease. 3. Mild small vessel ischemic change.   Electronically Signed   By: Abigail Miyamoto M.D.   On: 06/12/2014 20:27    Impression/Assessment:    1. Transitional cell carcinoma of the right renal pelvis: He status post right nephroureterectomy and according to his family had been doing very well from a surgical standpoint with very little pain, clear urine and a catheter that was draining. He was making adequate amounts of urine according to the family. He was not requiring a significant amount of pain medication and in fact stop taking any pain medication about 4 days ago. His renal pelvic cancer was low grade and metastatic disease to the brain is highly improbable however this has been ruled out by a CT scan of the brain which revealed no evidence of metastatic disease.   2. Mild renal insufficiency: This has developed status post removal of his right kidney. Some degree of elevation of his creatinine is to be expected. In addition the fact that he had not been eating or drinking for the past several days certainly could've contributed to this. It appears his creatinine although slightly elevated is stable. In addition he is making a good amount of urine.  3. Acute encephalopathy: I think this is likely going to be multifactorial. I don't believe that this is going to be secondary to his surgery however a renal ultrasound has been  ordered in order to rule out any degree of obstruction of his remaining kidney although he does continue to make a good amount of urine and his creatinine, although slightly elevated, appears to  be relatively stable. Polypharmacy is certainly a possible cause although according to his family he was not taking any new medications upon discharge and had not taken pain medication for several days. He does have a history of renal insufficiency and I note that he has been started on corticosteroids appropriately. Finally it does not appear that a urine culture was done on his admission however by history a urine culture was done 5 days ago and was apparently negative. He has not been on antibiotics although this is typically not necessary. He is currently on antibiotics and a culture has been ordered.  Plan:  1. Urine culture.  2. Renal ultrasound. 3. Will inform Dr. Louis Meckel of this patient's admission and follow with you.   Sylas Twombly C 06/13/2014, 11:14 AM

## 2014-06-13 NOTE — Progress Notes (Signed)
0100-Rapid response call received.  0104-Patient unresponsive to painful stimuli, is sweaty to touch. CBG is 93. HR 71, RR 21, B/P 91/59 (69) and O2 Saturation is 85% on room air. Patient has snoring respirations.  0107 Non-rebreather mask put on patient. 0108-B/P 109/64 (75) O2 saturation 100% with NRB mask.  0113-B/P 118/48, HR 67, RR 20, O2 saturation 100% on NRB mask.  0109- B/P 107/56, HR 65, RR 22. Patient continues with snoring respirations. Withdraws from painful stimuli. ABG drawn.  1828-QFDV Donnal Debar, NP here to check patient. B/P 109/56 (66), HR 67, RR 19. Respiratory to put CPAP on patient.

## 2014-06-13 NOTE — Progress Notes (Signed)
[name] [name] JTT:[id number] DOB: 1934/01/24 DOA: 06/12/2014 PCP: Purvis Kilts, MD  Brief narrative: 67 ? Mod Dementia, OSA/OHSS on CPAP, STEMI s/p cath 12/17/13 + CAGB, Orthostatic hypotension on both Midodrine + florinef. Prior TOb abuse, new onset DM ~12/2013 NOted Qt prolongation 466 this admit, chronic GERD + Solid dysphagia, known  bladder Ca + low grade ?   tract tumour R Renal pelvis PR Ca [s/p ESP2330 for BPH] recently s/p Robotic-assisted Lap Nephroureterectomy + Ventral hernia repair by Dr. Louis Meckel 06/05/14 admitted from [facility] 06/12/14 with toxic metabolic encephalopathy in the setting of progressive worsening of renal function Apparently patient had done well status post surgery and was discharged~2/28 he did well initially but on 3/2 it was noted that he had decreased sleep, increased agitation, and more confusion than usual. He has been diagnosed with dementia~2 years ago and had been placed on Aricept-his family confirm his dementia is mild to moderate- he was still able to cook for himself prior to his CABG and does need some assistance with daily but for the most part is pretty independent  Urology was consulted with regards to his recent surgery as this may been a postoperative complication  Past medical history-As per Problem list Chart reviewed as below-   Consultants:  Urology  Procedures:  Ultrasound renal pending  Antibiotics:  Rocephin 3/4   Subjective  Confused but reorientable to some extent Can tell me that he is at [place] long Thirsty-able to tolerate straw at bedside Placed in restraints secondary to risk to himself and agitation  Cannot really get a clear sense as to his review of systems Family at bedside explaining events perioperatively   Objective    Interim History:   Telemetry:    Objective: Filed Vitals:   06/12/14 1930 06/12/14 2241 06/12/14 2245 06/13/14 0802  BP: 150/73 132/76 132/76 146/75  Pulse: 70 70 72 80  Temp:  97.5 F  (36.4 C) 97.5 F (36.4 C) 98.3 F (36.8 C)  TempSrc:  Oral  Oral  Resp: 20 22 22 20   Height:      Weight:      SpO2: 96% 97% 97% 97%    Intake/Output Summary (Last 24 hours) at 06/13/14 1019 Last data filed at 06/13/14 0600  Gross per 24 hour  Intake  397.5 ml  Output    550 ml  Net -152.5 ml    Exam:  General:  Confused but redirectable to some extent-jittery and agitated Cardiovascular:  S1-S2 no murmur rub or gallop Respiratory:  Clinically clear no added sound Abdomen:  Multiple abdominal scars however no rebound or guarding SNo lower extremity edema NConfused but moving 4 limbs purposefully on command  Data Reviewed: Basic Metabolic Panel:  Recent Labs Lab 06/07/14 1100 06/12/14 1854 06/13/14 0650  NA 137 137 138  K 3.9 3.2* 3.6  CL 104 102 104  CO2 28 26 30   GLUCOSE 108* 96 93  BUN 18 26* 24*  CREATININE 1.90* 2.15* 2.07*  CALCIUM 8.7 9.0 8.4   Liver Function Tests:  Recent Labs Lab 06/13/14 0650  AST 35  ALT 16  ALKPHOS 53  BILITOT 0.7  PROT 6.2  ALBUMIN 3.2*   No results for input(s): LIPASE, AMYLASE in the last 168 hours. No results for input(s): AMMONIA in the last 168 hours. CBC:  Recent Labs Lab 06/12/14 1854  WBC 11.1*  NEUTROABS 7.6  HGB 10.4*  HCT 32.6*  MCV 87.9  PLT 260   Cardiac Enzymes:  Recent  Labs Lab 06/12/14 1854 06/12/14 2125 06/13/14 0039 06/13/14 0650  TROPONINI 0.31* 0.27* 0.25* 0.23*   BNP: Invalid input(s): POCBNP CBG:  Recent Labs Lab 06/13/14 0051 06/13/14 0739  GLUCAP 93 90    Recent Results (from the past 240 hour(s))  Blood culture (routine x 2)     Status: None (Preliminary result)   Collection Time: 06/12/14  9:25 PM  Result Value Ref Range Status   Specimen Description BLOOD LEFT ARM  Final   Special Requests BOTTLES DRAWN AEROBIC AND ANAEROBIC 6CC  Final   Culture NO GROWTH 1 DAY  Final   Report Status PENDING  Incomplete  Blood culture (routine x 2)     Status: None (Preliminary  result)   Collection Time: 06/12/14  9:30 PM  Result Value Ref Range Status   Specimen Description BLOOD LEFT HAND  Final   Special Requests BOTTLES DRAWN AEROBIC AND ANAEROBIC 6CC  Final   Culture NO GROWTH 1 DAY  Final   Report Status PENDING  Incomplete     Studies:              All Imaging reviewed and is as per above notation   Scheduled Meds: . aspirin EC  325 mg Oral Daily  . cefTRIAXone (ROCEPHIN)  IV  1 g Intravenous Q24H  . donepezil  10 mg Oral QHS  . levothyroxine  125 mcg Oral QAC breakfast  . pantoprazole  40 mg Oral q morning - 10a  . sodium chloride  3 mL Intravenous Q12H   Continuous Infusions: . 0.9 % NaCl with KCl 20 mEq / L 75 mL/hr at 06/13/14 0042     Assessment/Plan: 1.  Toxic metabolic encephalopathy-likely secondary to iatrogenic medications [pain meds, Aricept, Vesicare, Lexapro] in a setting of rising creatinine status post robotic right laparoscopic nephroureterectomy-urology's opinion is that rising creatinine is expected and urine analysis would be expected to contain TNTC WBC-I completely agree as patient has only a minimally elevated white count no shift to the left suggestive of infection.  Nonetheless let's get a urine culture and follow this-continue for now ceftriaxone. We will also add on an ammonia and a TSH to rule out any other causes for confusion.  His family tells me prior to discharge from surgery he was having "hallucinations" -I'm not sure what to make of that and we will need to revisit him in a.m. 2. ? Pyelonephritis-see above discussion 3. demand ischemia-troponin trending downward EKG on admission benign-no further workup required 4. Recent CABG 12/17/13 -cannot give beta blocker as history orthostasis , ACE inhibitor relatively contraindicated given renal failure . Continue aspirin 325 daily . May need statin if can tolerate  5. Polypharmacy-I have adjusted his medications downward-I've completely discontinued his Lexapro I will cut back  his Aricept 5 mg. Admitting physician discontinued his pain medications [Vicodin] which I agree with-I've renally dosed his pantoprazole 6. Acute renal insufficiency with mild hypokalemia 3.2 on admission Estimated Creatinine Clearance: 35.7 mL/min (by C-G formula based on Cr of 2.07).-continue saline75 cc per hour with 20 of K repeat labs in a.m. 7. History of orthostatic hypotension-Midodrin/Florinef on hold for now  8. Reported history of adrenal insufficiency-reasonable to obtain a free cortisol--he is not really hypotensive but I feel given the stress of recent surgery he may benefit from IV Solu-Cortef 50 mg every 8 and monitor for response. 9. OHS S/OSA-continue CPAP at night if he is able to tolerate  Code Status: Full Family Communication: Detailed discussion about side  with family Disposition Plan: Inpatient  > 35 minutes   Verneita Griffes, MD  [facility] Pager [phone] 06/13/2014, 10:19 AM    LOS: 1 day

## 2014-06-13 NOTE — Procedures (Signed)
RT assess pt and will not place pt on cpap due to pt being in 4 point restraints. RT notified RN.

## 2014-06-14 LAB — CBC WITH DIFFERENTIAL/PLATELET
Basophils Absolute: 0 10*3/uL (ref 0.0–0.1)
Basophils Relative: 0 % (ref 0–1)
Eosinophils Absolute: 0 10*3/uL (ref 0.0–0.7)
Eosinophils Relative: 0 % (ref 0–5)
HCT: 32.2 % — ABNORMAL LOW (ref 39.0–52.0)
Hemoglobin: 10.4 g/dL — ABNORMAL LOW (ref 13.0–17.0)
LYMPHS PCT: 7 % — AB (ref 12–46)
Lymphs Abs: 0.7 10*3/uL (ref 0.7–4.0)
MCH: 28.6 pg (ref 26.0–34.0)
MCHC: 32.3 g/dL (ref 30.0–36.0)
MCV: 88.5 fL (ref 78.0–100.0)
Monocytes Absolute: 0.4 10*3/uL (ref 0.1–1.0)
Monocytes Relative: 4 % (ref 3–12)
Neutro Abs: 9.4 10*3/uL — ABNORMAL HIGH (ref 1.7–7.7)
Neutrophils Relative %: 89 % — ABNORMAL HIGH (ref 43–77)
Platelets: 290 10*3/uL (ref 150–400)
RBC: 3.64 MIL/uL — AB (ref 4.22–5.81)
RDW: 15.6 % — ABNORMAL HIGH (ref 11.5–15.5)
WBC: 10.5 10*3/uL (ref 4.0–10.5)

## 2014-06-14 LAB — COMPREHENSIVE METABOLIC PANEL
ALBUMIN: 3.1 g/dL — AB (ref 3.5–5.2)
ALT: 18 U/L (ref 0–53)
AST: 38 U/L — ABNORMAL HIGH (ref 0–37)
Alkaline Phosphatase: 49 U/L (ref 39–117)
Anion gap: 5 (ref 5–15)
BUN: 18 mg/dL (ref 6–23)
CHLORIDE: 108 mmol/L (ref 96–112)
CO2: 27 mmol/L (ref 19–32)
CREATININE: 1.71 mg/dL — AB (ref 0.50–1.35)
Calcium: 8.6 mg/dL (ref 8.4–10.5)
GFR calc Af Amer: 42 mL/min — ABNORMAL LOW (ref >90)
GFR calc non Af Amer: 37 mL/min — ABNORMAL LOW (ref >90)
GLUCOSE: 137 mg/dL — AB (ref 70–99)
Potassium: 3.4 mmol/L — ABNORMAL LOW (ref 3.5–5.1)
Sodium: 140 mmol/L (ref 135–145)
Total Bilirubin: 0.5 mg/dL (ref 0.3–1.2)
Total Protein: 5.9 g/dL — ABNORMAL LOW (ref 6.0–8.3)

## 2014-06-14 LAB — URINE CULTURE
Colony Count: NO GROWTH
Culture: NO GROWTH

## 2014-06-14 MED ORDER — ZIPRASIDONE MESYLATE 20 MG IM SOLR
10.0000 mg | Freq: Once | INTRAMUSCULAR | Status: AC
  Administered 2014-06-14: 10 mg via INTRAMUSCULAR
  Filled 2014-06-14: qty 20

## 2014-06-14 MED ORDER — OLANZAPINE 5 MG PO TBDP
5.0000 mg | ORAL_TABLET | Freq: Once | ORAL | Status: AC
  Administered 2014-06-14: 5 mg via ORAL
  Filled 2014-06-14: qty 1

## 2014-06-14 MED ORDER — POTASSIUM CHLORIDE IN NACL 20-0.9 MEQ/L-% IV SOLN
INTRAVENOUS | Status: AC
  Administered 2014-06-14 (×2): via INTRAVENOUS
  Filled 2014-06-14: qty 1000

## 2014-06-14 MED ORDER — HYOSCYAMINE SULFATE 0.125 MG SL SUBL
0.1250 mg | SUBLINGUAL_TABLET | Freq: Four times a day (QID) | SUBLINGUAL | Status: DC | PRN
  Administered 2014-06-14 – 2014-06-17 (×4): 0.125 mg via SUBLINGUAL
  Filled 2014-06-14 (×7): qty 1

## 2014-06-14 MED ORDER — LORAZEPAM 2 MG/ML IJ SOLN
1.0000 mg | INTRAMUSCULAR | Status: DC | PRN
  Administered 2014-06-14 (×2): 1 mg via INTRAVENOUS
  Administered 2014-06-14 – 2014-06-16 (×4): 2 mg via INTRAVENOUS
  Administered 2014-06-18: 1 mg via INTRAVENOUS
  Filled 2014-06-14 (×7): qty 1

## 2014-06-14 MED ORDER — OLANZAPINE 5 MG PO TBDP
5.0000 mg | ORAL_TABLET | Freq: Every day | ORAL | Status: DC
  Administered 2014-06-15: 5 mg via ORAL
  Filled 2014-06-14: qty 1

## 2014-06-14 MED ORDER — DIPHENHYDRAMINE HCL 50 MG/ML IJ SOLN
INTRAMUSCULAR | Status: AC
  Filled 2014-06-14: qty 1

## 2014-06-14 MED ORDER — DONEPEZIL HCL 5 MG PO TABS
10.0000 mg | ORAL_TABLET | Freq: Every day | ORAL | Status: DC
  Administered 2014-06-15 – 2014-06-21 (×5): 10 mg via ORAL
  Filled 2014-06-14 (×7): qty 2

## 2014-06-14 MED ORDER — HYDROCORTISONE NA SUCCINATE PF 100 MG IJ SOLR
50.0000 mg | Freq: Every day | INTRAMUSCULAR | Status: DC
  Administered 2014-06-15: 50 mg via INTRAVENOUS
  Filled 2014-06-14: qty 1

## 2014-06-14 MED ORDER — ESCITALOPRAM OXALATE 20 MG PO TABS
20.0000 mg | ORAL_TABLET | Freq: Every day | ORAL | Status: DC
  Administered 2014-06-14 – 2014-06-22 (×9): 20 mg via ORAL
  Filled 2014-06-14 (×9): qty 1

## 2014-06-14 NOTE — Progress Notes (Signed)
[name] [name] QQV:[id number] DOB: Oct 04, 1933 DOA: 06/12/2014 PCP: Purvis Kilts, MD  Brief narrative: 63 ? Mod Dementia, OSA/OHSS on CPAP, STEMI s/p cath 12/17/13 + CAGB, Orthostatic hypotension on both Midodrine + florinef. 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 Louis Meckel 06/05/14 admitted from [facility] 06/12/14 with toxic metabolic encephalopathy in the setting of progressive worsening of renal function Apparently patient had done well status post surgery and was discharged~2/28 he did well initially but on 3/2 it was noted that he had decreased sleep, increased agitation, and more confusion than usual. He has been diagnosed with dementia~2 years ago and had been placed on Aricept-his family confirm his dementia is mild to moderate- he was still able to cook for himself prior to his CABG and does need some assistance with daily but for the most part is pretty independent Urology was consulted with regards to his recent surgery as this may been a postoperative complication  Past medical history-As per Problem list Chart reviewed as below-   Consultants:  Urology  Procedures:  Ultrasound renal pending  Antibiotics:  Rocephin 3/4   Subjective   Agitated Risk to himself and therefore restraints replaced Is able to verbalize clearly but has delusions of his son-in-law getting together with his former girlfriend or something to that effect Much more alert than he was before   Objective    Interim History:   Telemetry:    Objective: Filed Vitals:   06/13/14 0802 06/13/14 1351 06/13/14 2113 06/14/14 0537  BP: 146/75 145/56 119/70 140/74  Pulse: 80 78 78 72  Temp: 98.3 F (36.8 C) 98.2 F (36.8 C) 97.3 F (36.3 C) 98.1 F (36.7 C)    TempSrc: Oral Oral Oral Oral  Resp: 20 20 20 19   Height:      Weight:      SpO2: 97% 96% 95% 96%    Intake/Output Summary (Last 24 hours) at 06/14/14 1203 Last data filed at 06/14/14 1010  Gross per 24 hour  Intake    300 ml  Output   1750 ml  Net  -1450 ml    Exam:  General:  Confused but redirectable to some excitable Cardiovascular:  S1-S2 no murmur rub or gallop Respiratory:  Clinically clear no added sound Abdomen:  Multiple abdominal scars however no rebound or guarding SNo lower extremity edema NConfused but moving 4 limbs purposefully on command  Data Reviewed: Basic Metabolic Panel:  Recent Labs Lab 06/12/14 1854 06/13/14 0650  NA 137 138  K 3.2* 3.6  CL 102 104  CO2 26 30  GLUCOSE 96 93  BUN 26* 24*  CREATININE 2.15* 2.07*  CALCIUM 9.0 8.4   Liver Function Tests:  Recent Labs Lab 06/13/14 0650  AST 35  ALT 16  ALKPHOS 53  BILITOT 0.7  PROT 6.2  ALBUMIN 3.2*   No results for input(s): LIPASE, AMYLASE in the last 168 hours.  Recent Labs Lab 06/13/14 1150  AMMONIA 12   CBC:  Recent Labs Lab 06/12/14 1854 06/14/14 1105  WBC 11.1* 10.5  NEUTROABS 7.6 9.4*  HGB 10.4* 10.4*  HCT 32.6* 32.2*  MCV 87.9 88.5  PLT 260 290   Cardiac Enzymes:  Recent Labs Lab 06/12/14 1854 06/12/14 2125 06/13/14 0039 06/13/14 8416  06/13/14 1154  TROPONINI 0.31* 0.27* 0.25* 0.23* 0.20*   BNP: Invalid input(s): POCBNP CBG:  Recent Labs Lab 06/13/14 0051 06/13/14 0739  GLUCAP 93 90    Recent Results (from the past 240 hour(s))  Blood culture (routine x 2)     Status: None (Preliminary result)   Collection Time: 06/12/14  9:25 PM  Result Value Ref Range Status   Specimen Description BLOOD LEFT ARM  Final   Special Requests BOTTLES DRAWN AEROBIC AND ANAEROBIC 6CC  Final   Culture NO GROWTH 2 DAYS  Final   Report Status PENDING  Incomplete  Blood culture (routine x 2)     Status: None (Preliminary result)   Collection Time: 06/12/14  9:30  PM  Result Value Ref Range Status   Specimen Description BLOOD LEFT HAND  Final   Special Requests BOTTLES DRAWN AEROBIC AND ANAEROBIC 6CC  Final   Culture NO GROWTH 2 DAYS  Final   Report Status PENDING  Incomplete     Studies:              All Imaging reviewed and is as per above notation   Scheduled Meds: . aspirin EC  325 mg Oral Daily  . cefTRIAXone (ROCEPHIN)  IV  1 g Intravenous Q24H  . donepezil  5 mg Oral QHS  . hydrocortisone sod succinate (SOLU-CORTEF) inj  50 mg Intravenous Q8H  . levothyroxine  150 mcg Oral QAC breakfast  . pantoprazole  20 mg Oral q morning - 10a  . sodium chloride  3 mL Intravenous Q12H   Continuous Infusions: . 0.9 % NaCl with KCl 20 mEq / L 75 mL/hr at 06/14/14 1044     Assessment/Plan:  1.  Toxic metabolic encephalopathy with delirium-potentially secondary to hypoxia as well as [pain meds, Aricept, Vesicare, Lexapro] in a setting of rising creatinine status post robotic right laparoscopic nephroureterectomy-urology's opinion is that rising creatinine is expected and urine analysis would be expected to contain TNTC WBC--continue ceftriaxone until cultures return. Ammonia 12, TSH elevated as below.  His family tells me prior to discharge from surgery he was having "hallucinations"-- they sound more like delusion. psych consult is pending--I discussed with psychiatrist on 06/14/14 and they recommended either IM Geodon 10 mg up to 4 doses for severe agitation or Zydis 5 mg SL. 2. ? Pyelonephritis-see above discussion 3. demand ischemia-troponin trending downward EKG on admission benign-no further workup required 4. Recent CABG 12/17/13 -cannot give beta blocker as history orthostasis , ACE inhibitor relatively contraindicated given renal failure . Continue aspirin 325 daily . May need statin if can tolerate  5. Elevated TSH-unclear how to interpret in setting of recent surgery. Nonetheless thyroxine dosage adjusted 3/5 6. Polypharmacy-some evidence to  suggest anticholinesterase inhibitors can worsen delirium such as Aricept-after my discussion with Dr. Earley Brooke was recommended to restart his Lexapro 20 and Aricept 10 on 3/6 7. Acute renal insufficiency with mild hypokalemia 3.2 on admission Estimated Creatinine Clearance: 43.3 mL/min (by C-G formula based on Cr of 1.71).-continue saline75 cc per hour with 20 of K repeat labs in a.m. GFR is improved from 35-43 8. History of orthostatic hypotension-Midodrin/Florinef on hold for now  9. Reported history of adrenal rapid wean of Solu-Cortef from 50 every 8 to 50 every 12 and then 50 daily--steroids can cause delirium as well  10. OHS S/OSA-continue CPAP at night if he is able to tolerate  Code Status: Full Family Communication: Detailed discussion about side with family Disposition Plan: Inpatient  45  minutes   Verneita Griffes, MD  [facility] Pager [phone] 06/14/2014, 12:03 PM    LOS: 2 days

## 2014-06-14 NOTE — Consult Note (Signed)
Subjective  The patient does not complain of any pain or discomfort. He appears more alert today than he did yesterday. He still seems somewhat confused.  Objective: Vital signs in last 24 hours: Temp:  [97.3 F (36.3 C)-98.3 F (36.8 C)] 98.1 F (36.7 C) (03/06 0537) Pulse Rate:  [72-80] 72 (03/06 0537) Resp:  [19-20] 19 (03/06 0537) BP: (119-146)/(56-75) 140/74 mmHg (03/06 0537) SpO2:  [95 %-97 %] 96 % (03/06 0537)A  Intake/Output from previous day: 03/05 0701 - 03/06 0700 In: 240 [P.O.:240] Out: 1575 [Urine:1575] Intake/Output this shift:    Past Medical History  Diagnosis Date  . Chronic constipation   . GERD (gastroesophageal reflux disease)   . Arthritis   . Hyperlipidemia   . Orthostatic hypotension 11/19/2013  . Coronary artery disease     CARDIOLOGIST-  DR BERRY  --  HX  STEMI  12-17-2013  S/P  CABG X4  12-18-2013  . Mild dementia   . Type 2 diabetes mellitus   . Hypothyroidism   . History of esophageal dilatation   . History of gastritis   . History of hiatal hernia   . History of bladder cancer     hx  TCC low grade  . Prostate cancer     low-grade  . Kidney tumor     right  . BPH (benign prostatic hyperplasia)   . CKD (chronic kidney disease) stage 3, GFR 30-59 ml/min   . History of kidney stones   . ED (erectile dysfunction)   . S/P CABG x 4     12-18-2013  . Bladder tumor   . Complication of anesthesia     HARD TO WAKE  . OSA on CPAP     mild osa   per sleep study 07-11-2009- patient states does not use regularly    Physical Exam:  Lungs - Normal respiratory effort, chest expands symmetrically.  Abdomen - Soft, non-tender & non-distended.  Lab Results:  Recent Labs  06/12/14 1854  WBC 11.1*  HGB 10.4*  HCT 32.6*   BMET  Recent Labs  06/12/14 1854 06/13/14 0650  NA 137 138  K 3.2* 3.6  CL 102 104  CO2 26 30  GLUCOSE 96 93  BUN 26* 24*  CREATININE 2.15* 2.07*  CALCIUM 9.0 8.4   No results for input(s): LABURIN in the  last 72 hours. Results for orders placed or performed during the hospital encounter of 06/12/14  Blood culture (routine x 2)     Status: None (Preliminary result)   Collection Time: 06/12/14  9:25 PM  Result Value Ref Range Status   Specimen Description BLOOD LEFT ARM  Final   Special Requests BOTTLES DRAWN AEROBIC AND ANAEROBIC 6CC  Final   Culture NO GROWTH 2 DAYS  Final   Report Status PENDING  Incomplete  Blood culture (routine x 2)     Status: None (Preliminary result)   Collection Time: 06/12/14  9:30 PM  Result Value Ref Range Status   Specimen Description BLOOD LEFT HAND  Final   Special Requests BOTTLES DRAWN AEROBIC AND ANAEROBIC 6CC  Final   Culture NO GROWTH 2 DAYS  Final   Report Status PENDING  Incomplete    Studies/Results: US Renal  06/13/2014   CLINICAL DATA:  Elevated BUN/creatinine.  EXAM: RENAL/URINARY TRACT ULTRASOUND COMPLETE  COMPARISON:  CT 03/13/2014  FINDINGS: Right Kidney:  Surgically absent  Left Kidney: 12.0 cm. Normal renal cortical thickness for age. No hydronephrosis. Left renal collecting system stone or  stones. Most well-defined measures 5 mm in the inter/lower pole region.  Bladder: Per clinical history, the patient has a Foley catheter. It is not visualized in the bladder, and the bladder is not decompressed.  IMPRESSION: 1. Status post right nephrectomy. 2. No evidence of left-sided hydronephrosis. At least 1 left renal collecting system calculus identified. 3. Foley catheter not identified within the urinary bladder. Bladder is not decompressed. Correlate with position and function of Foley catheter. These results will be called to the ordering clinician or representative by the Radiologist Assistant, and communication documented in the PACS or zVision Dashboard.   Electronically Signed   By: Abigail Miyamoto M.D.   On: 06/13/2014 12:29     Assessment: He seems to be more alert and doing better today. His renal ultrasound revealed no evidence of  hydronephrosis. The radiologist mentioned that his catheter was not identified within the bladder and I also did not see it there. He has a history of a prior transurethral resection of his prostate and I suspect the balloon was blown up in the prostatic fossa with the tip at the bladder neck which would explain why his urine was draining but his catheter was not seen in the bladder. I had his nurse deflate the balloon and advanced the catheter into the bladder and reinflated the balloon. His Foley catheter appears to be draining properly with clear urine today.  His urine culture remains pending. He had a markedly elevated TSH which is being addressed.  Plan:   1. Continue Foley catheter drainage. 2. Await urine culture results.  3. No new urologic recommendations today.       [name] [name] C 06/14/2014, 7:55 AM

## 2014-06-14 NOTE — Progress Notes (Signed)
Did not place on cpap due to being 4point restrained and pt would not be able to take mask off if needed, RT will continue to monitor

## 2014-06-14 NOTE — Progress Notes (Addendum)
Patient was calm this morning, I was able to take off right hand/wrist restraint due to swelling in right arm, MD notified. Then MD released all restraints and we sat patient up to chair with 4 assist. Patient became more and more agitated, becoming slightly violent, and wanting to pull at IV and foley. Family at bedside entire time-helped to calm patient at times. Administered 1mg  ativan IV per order, patient's agitation was unchanged. Then assisted patient to bed with 3 assist, patient was very agitated and violent, hard tremors. MD notified, and saw patient, per MD administered 10mg  geodon IM and zyprexa tablet, patient's agitation remains unchanged. Restraints reapplied, 4 point with 2 mitten. Lights off, family asked to leave and minimize noise. Patient left to rest, family still at bedside but quite.

## 2014-06-15 LAB — CBC WITH DIFFERENTIAL/PLATELET
BASOS ABS: 0.1 10*3/uL (ref 0.0–0.1)
Basophils Relative: 1 % (ref 0–1)
Eosinophils Absolute: 0.6 10*3/uL (ref 0.0–0.7)
Eosinophils Relative: 5 % (ref 0–5)
HEMATOCRIT: 31 % — AB (ref 39.0–52.0)
Hemoglobin: 9.8 g/dL — ABNORMAL LOW (ref 13.0–17.0)
LYMPHS ABS: 1.8 10*3/uL (ref 0.7–4.0)
LYMPHS PCT: 17 % (ref 12–46)
MCH: 28.5 pg (ref 26.0–34.0)
MCHC: 31.6 g/dL (ref 30.0–36.0)
MCV: 90.1 fL (ref 78.0–100.0)
Monocytes Absolute: 0.8 10*3/uL (ref 0.1–1.0)
Monocytes Relative: 8 % (ref 3–12)
Neutro Abs: 7 10*3/uL (ref 1.7–7.7)
Neutrophils Relative %: 69 % (ref 43–77)
PLATELETS: 261 10*3/uL (ref 150–400)
RBC: 3.44 MIL/uL — AB (ref 4.22–5.81)
RDW: 16 % — ABNORMAL HIGH (ref 11.5–15.5)
WBC: 10.3 10*3/uL (ref 4.0–10.5)

## 2014-06-15 LAB — BLOOD GAS, ARTERIAL
Acid-Base Excess: 1.7 mmol/L (ref 0.0–2.0)
BICARBONATE: 25 meq/L — AB (ref 20.0–24.0)
Drawn by: 295031
O2 Content: 2 L/min
O2 SAT: 95.4 %
PATIENT TEMPERATURE: 98.6
PO2 ART: 74.5 mmHg — AB (ref 80.0–100.0)
TCO2: 22.9 mmol/L (ref 0–100)
pCO2 arterial: 36.5 mmHg (ref 35.0–45.0)
pH, Arterial: 7.451 — ABNORMAL HIGH (ref 7.350–7.450)

## 2014-06-15 LAB — COMPREHENSIVE METABOLIC PANEL
ALT: 18 U/L (ref 0–53)
AST: 35 U/L (ref 0–37)
Albumin: 2.7 g/dL — ABNORMAL LOW (ref 3.5–5.2)
Alkaline Phosphatase: 51 U/L (ref 39–117)
Anion gap: 6 (ref 5–15)
BUN: 19 mg/dL (ref 6–23)
CALCIUM: 8.6 mg/dL (ref 8.4–10.5)
CO2: 28 mmol/L (ref 19–32)
Chloride: 109 mmol/L (ref 96–112)
Creatinine, Ser: 1.82 mg/dL — ABNORMAL HIGH (ref 0.50–1.35)
GFR calc Af Amer: 39 mL/min — ABNORMAL LOW (ref >90)
GFR calc non Af Amer: 34 mL/min — ABNORMAL LOW (ref >90)
Glucose, Bld: 82 mg/dL (ref 70–99)
POTASSIUM: 3.7 mmol/L (ref 3.5–5.1)
SODIUM: 143 mmol/L (ref 135–145)
TOTAL PROTEIN: 5.6 g/dL — AB (ref 6.0–8.3)
Total Bilirubin: 0.5 mg/dL (ref 0.3–1.2)

## 2014-06-15 LAB — URINE CULTURE
Colony Count: NO GROWTH
Culture: NO GROWTH

## 2014-06-15 LAB — VITAMIN B12: Vitamin B-12: 609 pg/mL (ref 211–911)

## 2014-06-15 MED ORDER — OLANZAPINE 5 MG PO TBDP
5.0000 mg | ORAL_TABLET | Freq: Two times a day (BID) | ORAL | Status: DC
  Administered 2014-06-15 – 2014-06-18 (×5): 5 mg via ORAL
  Filled 2014-06-15 (×8): qty 1

## 2014-06-15 NOTE — Progress Notes (Signed)
ID: [id number] POD#10 from robotic assisted right nephro-ureterectomy, readmitted with encephalopathy.  Intv: I have read chart and examined patient.  Unfortunately he was fairly somnolent and did not participate in my exam.  PE: AFVSS  Intake/Output Summary (Last 24 hours) at 06/15/14 0738 Last data filed at 06/15/14 0600  Gross per 24 hour  Intake  672.5 ml  Output    775 ml  Net -102.5 ml  abdomen is soft, incisions are intact without surrounding erythema Foley is draining clear yellow urine.   Recent Labs  06/12/14 1854 06/14/14 1105 06/15/14 0550  WBC 11.1* 10.5 10.3  HGB 10.4* 10.4* 9.8*  HCT 32.6* 32.2* 31.0*    Recent Labs  06/13/14 0650 06/14/14 1105 06/15/14 0550  NA 138 140 143  K 3.6 3.4* 3.7  CL 104 108 109  CO2 30 27 28   GLUCOSE 93 137* 82  BUN 24* 18 19  CREATININE 2.07* 1.71* 1.82*  CALCIUM 8.4 8.6 8.6   No results for input(s): LABPT, INR in the last 72 hours. No results for input(s): LABURIN in the last 72 hours. Results for orders placed or performed during the hospital encounter of 06/12/14  Blood culture (routine x 2)     Status: None (Preliminary result)   Collection Time: 06/12/14  9:25 PM  Result Value Ref Range Status   Specimen Description BLOOD LEFT ARM  Final   Special Requests BOTTLES DRAWN AEROBIC AND ANAEROBIC 6CC  Final   Culture NO GROWTH 2 DAYS  Final   Report Status PENDING  Incomplete  Blood culture (routine x 2)     Status: None (Preliminary result)   Collection Time: 06/12/14  9:30 PM  Result Value Ref Range Status   Specimen Description BLOOD LEFT HAND  Final   Special Requests BOTTLES DRAWN AEROBIC AND ANAEROBIC 6CC  Final   Culture NO GROWTH 2 DAYS  Final   Report Status PENDING  Incomplete  Urine culture     Status: None   Collection Time: 06/13/14  9:50 AM  Result Value Ref Range Status   Specimen Description URINE, CATHETERIZED  Final   Special Requests NONE  Final   Colony Count NO GROWTH Performed at [facility]   Final   Culture NO GROWTH Performed at [facility]   Final   Report Status 06/14/2014 FINAL  Final     Imp: s/p R RAL nephro-ureterectomy, readmitted with encephalopathy. Rec: I have ordered a cystogram for the patient so that we can remove his foley.  This was ordered as outpatient and scheduled for today.  Since he's here, we'll get this done today.  We can also remove his stables prior to his discharge.  I see no evidence of infection or metabolic disturbance that are contributing to his encephalopathy.  I will defer this aspect of his care to Internal Medicine.  I will continue to follow.  I haven't been able to discuss path report with family or patient.  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  He needs no additional treatment for this.

## 2014-06-15 NOTE — Consult Note (Signed)
[name] [name] Psychiatry Consult   Reason for Consult:  Medication management for dementia with agitation and AMS Referring Physician:  Dr. Verlon Au  Patient Identification: [name] [name] MRN:  [id number] Principal Diagnosis: Dementia without behavioral disturbance Diagnosis:   Patient Active Problem List   Diagnosis Date Noted  . S/p nephrectomy [Z90.5] 06/12/2014  . Acute renal failure [N17.9] 06/12/2014  . UTI (lower urinary tract infection) [N39.0] 06/12/2014  . Metabolic encephalopathy [O12.24] 06/12/2014  . Elevated troponin [R79.89] 06/12/2014  . Altered mental state [R41.82] 06/12/2014  . Altered mental status [R41.82]   . Transitional cell carcinoma of kidney [C64.9] 06/05/2014  . TIA (transient ischemic attack) [G45.9] 01/23/2014  . Dementia without behavioral disturbance [F03.90] 01/23/2014  . Prolonged Q-T interval on ECG [I45.81] 01/23/2014  . DM type 2 (diabetes mellitus, type 2) [E11.9] 01/23/2014  . Anemia, post op [D64.9] 01/13/2014  . CAD, multiple vessel [I25.10] 12/18/2013  . S/P CABG x 4 [Z95.1] 12/18/2013  . ST elevation myocardial infarction (STEMI) of inferior wall, initial episode of care [I21.19] 12/17/2013  . Hypoxemia [R09.02] 11/19/2013  . Orthostatic hypotension [I95.1] 11/19/2013  . Dysphagia, unspecified(787.20) [R13.10] 12/12/2012  . Dizziness [R42] 11/07/2012  . Hyperlipidemia [E78.5] 11/07/2012  . Obstructive sleep apnea [G47.33] 11/07/2012    Total Time spent with patient: 45 minutes  Subjective:   [name] [name] is a 81 y.o. [name] patient admitted with AMS.  HPI: [name] [name] is a 81 years old [name] seen today and chart reviewed. Case discussed with the patient youngest son and daughter-in-law who were at bedside and also Dr. Verlon Au.  Patient was not able to contribute to this evaluation secondary to being sedated with medication for increased agitation and aggressive behavior and also required soft restraints. Reportedly patient has been  living by himself and has been assisted by family and also help hired for 24 hours. Patient has been deteriorated since he has been suffering with multiple medical problems including heart attack, CABG, bladder cancer, kidney surgery and recently become psychotic with delusional thinking about his girlfriend has been married someone else, not sleeping well, not eating well and become irritable, agitated and aggressive. Patient has a history of moderate dementia and has been treated with medication from primary care physician over 2 years. Patient has no history of psychiatric treatment. Patient has no history of suicidal or homicidal ideation, intention or plans.   Medical history: 58 ? Mod Dementia, OSA/OHSS on CPAP, STEMI s/p cath 12/17/13 + CAGB, Orthostatic hypotension on both Midodrine + florinef. 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 Louis Meckel 06/05/14 admitted [facility] 06/12/14 with toxic metabolic encephalopathy in the setting of progressive worsening of renal function Apparently patient had done well status post surgery and was discharged~2/28 he did well initially but on 3/2 it was noted that he had decreased sleep, increased agitation, and more confusion than usual. He has been diagnosed with dementia~2 years ago and had been placed on Aricept-his family confirm his dementia is mild to moderate- he was still able to cook for himself prior to his CABG and does need some assistance with daily but for the most part is pretty independent Urology was consulted with regards to his recent surgery as this may been a postoperative complication  HPI Elements:  Location:  Dementia and agitation. Quality:  Poor. Severity:  Unable to care for himself. Timing:  Multiple  medical problems. Duration:  More than a week. Context:  Cognitive deficits agitation and delusional thinking.  Past Medical History:  Past Medical History  Diagnosis Date  . Chronic constipation   . GERD (gastroesophageal reflux disease)   . Arthritis   . Hyperlipidemia   . Orthostatic hypotension 11/19/2013  . Coronary artery disease     CARDIOLOGIST-  DR BERRY  --  HX  STEMI  12-17-2013  S/P  CABG X4  12-18-2013  . Mild dementia   . Type 2 diabetes mellitus   . Hypothyroidism   . History of esophageal dilatation   . History of gastritis   . History of hiatal hernia   . History of bladder cancer     hx  TCC low grade  . Prostate cancer     low-grade  . Kidney tumor     right  . BPH (benign prostatic hyperplasia)   . CKD (chronic kidney disease) stage 3, GFR 30-59 ml/min   . History of kidney stones   . ED (erectile dysfunction)   . S/P CABG x 4     12-18-2013  . Bladder tumor   . Complication of anesthesia     HARD TO WAKE  . OSA on CPAP     mild osa   per sleep study 07-11-2009- patient states does not use regularly    Past Surgical History  Procedure Laterality Date  . Upper gastrointestinal endoscopy  04/06/2010  &  01-08-2013    w/ esophageal dilatation  . Hemorrhoid surgery    . Hand surgery      right  . Wrist fracture surgery      pins placed-left  . Cystoscopy with biopsy  01/09/2012    Procedure: CYSTOSCOPY WITH BIOPSY;  Surgeon: Marissa Nestle, MD;  Location: [facility] ORS;  Service: Urology;  Laterality: N/A;  Cystoscopy with Multiple Bladder Biopsies  . Esophagogastroduodenoscopy (egd) with esophageal dilation N/A 01/08/2013    Procedure: ESOPHAGOGASTRODUODENOSCOPY (EGD) WITH ESOPHAGEAL DILATION;  Surgeon: Rogene Houston, MD;  Location: [facility] ENDO SUITE;  Service: Endoscopy;  Laterality: N/A;  120  . Left heart cath N/A 12/17/2013    Procedure: LEFT HEART CATH;  Surgeon: Leonie Man, MD;  Location: [facility] CATH LAB;  Service: Cardiovascular;  Laterality: N/A;  .  Left heart catheterization with coronary angiogram N/A 12/17/2013    Procedure: LEFT HEART CATHETERIZATION WITH CORONARY ANGIOGRAM;  Surgeon: Leonie Man, MD;  Location: [facility] CATH LAB;  Service: Cardiovascular;  Laterality: N/A;  . Colonoscopy with esophagogastroduodenoscopy (egd)  08-15-2006    w/ esophageal dilatation  . Umbilical hernia repair  04-24-2003  . Transurethral resection of bladder tumor  06-14-2004  . Transurethral resection of prostate  10-04-2004  . Cysto/  removal bladder calculus/  resection bladder tumor  10-16-2006  . Cystostomy w/ bladder biopsy  01-09-2012  . Cardiac catheterization  12-17-2013   dr Shanon Brow harding    severe disease RCA with thrombotic appearing subtotal occlusions/  95-99%  OM2/  diffuse disease LAD  and D1/  mild to moderate basal to mid inferior hypokinesis/  severe elevated LVEDP/  ef 45-50%  . Coronary artery bypass graft N/A 12/18/2013    Procedure: Coronary artery bypass grafting times four using left internal mammary artery and endoscopically harvested right saphenous vein graft;  Surgeon: Gaye Pollack, MD;  Location: [facility] OR;  Service: Open Heart  Surgery;  Laterality: N/A;  . Transthoracic echocardiogram  11-14-2012    grade I diastolic dysfunction/  ef 55-60%/  trivial MR  and TR  . Cardiovascular stress test  08-09-2009  dr berry    mild to moderate perfusion defect in basal , mid, and apical inferior regions consistent with an infart/scar/   No evidence ischemia/  ef 51%/  no ECG changes/   low risk scan  . Transurethral resection of bladder tumor N/A 04/07/2014    Procedure: TRANSURETHRAL RESECTION OF BLADDER TUMOR (TURBT);  Surgeon: Arvil Persons, MD;  Location: [facility];  Service: Urology;  Laterality: N/A;  . Cystoscopy/retrograde/ureteroscopy Right 04/07/2014    Procedure: CYSTOSCOPY/RETROGRADE/URETEROSCOPY WITH BIOPSY OF RENAL PELVIS TUMOR;  Surgeon: Arvil Persons, MD;  Location: [facility];  Service: Urology;   Laterality: Right;  . Robot assisted laparoscopic nephrectomy Right 06/05/2014    Procedure: ROBOTIC ASSISTED LAPAROSCOPIC RIGHT NEPHROURETERECTOMY, VENTRAL HERNIA REPAIR ;  Surgeon: Ardis Hughs, MD;  Location: [facility] ORS;  Service: Urology;  Laterality: Right;  . Cystoscopy w/ ureteral stent placement Right 06/05/2014    Procedure: CYSTOSCOPY ;  Surgeon: Ardis Hughs, MD;  Location: [facility] ORS;  Service: Urology;  Laterality: Right;   Family History:  Family History  Problem Relation Age of Onset  . Cancer - Lung Brother    Social History:  History  Alcohol Use No     History  Drug Use No    History   Social History  . Marital Status: Divorced    Spouse Name: N/A  . Number of Children: 3  . Years of Education: HS   Occupational History  .     Social History Main Topics  . Smoking status: Former Smoker -- 1.00 packs/day for 4 years    Types: Cigarettes  . Smokeless tobacco: Former Systems developer    Types: [place] date: 01/03/1960  . Alcohol Use: No  . Drug Use: No  . Sexual Activity: Yes    Birth Control/ Protection: None   Other Topics Concern  . None   Social History Narrative   Patient is single and lives alone.   Patient is retired.   Patient has a high school education.   Patient is right-handed.   Patient drinks one cup of coffee and one cup of soda or tea daily.   Patient has three adult children.   Additional Social History:                          Allergies:   Allergies  Allergen Reactions  . Statins Other (See Comments)    MUSCLE ACHES    Vitals: Blood pressure 137/76, pulse 70, temperature 97.6 F (36.4 C), temperature source Oral, resp. rate 20, height 5\' 10"  (1.778 m), weight 105.235 kg (232 lb), SpO2 100 %.  Risk to Self: Is patient at risk for suicide?: No Risk to Others:   Prior Inpatient Therapy:   Prior Outpatient Therapy:    Current Facility-Administered Medications  Medication Dose Route Frequency Provider Last Rate  Last Dose  . aspirin EC tablet 325 mg  325 mg Oral Daily Phillips Grout, MD   325 mg at 06/14/14 1043  . cefTRIAXone (ROCEPHIN) 1 g in dextrose 5 % 50 mL IVPB - Premix  1 g Intravenous Q24H Phillips Grout, MD   1 g at 06/14/14 1959  . donepezil (ARICEPT) tablet 10 mg  10  mg Oral QHS Nita Sells, MD   10 mg at 06/14/14 2203  . escitalopram (LEXAPRO) tablet 20 mg  20 mg Oral Daily Nita Sells, MD   20 mg at 06/14/14 1327  . hydrocortisone sodium succinate (SOLU-CORTEF) 100 MG injection 50 mg  50 mg Intravenous Daily Nita Sells, MD      . hyoscyamine (LEVSIN SL) SL tablet 0.125 mg  0.125 mg Sublingual Q6H PRN Malka So, MD   0.125 mg at 06/14/14 2213  . levothyroxine (SYNTHROID, LEVOTHROID) tablet 150 mcg  150 mcg Oral QAC breakfast Nita Sells, MD   150 mcg at 06/15/14 0800  . LORazepam (ATIVAN) injection 1-2 mg  1-2 mg Intravenous Q4H PRN Nita Sells, MD   2 mg at 06/15/14 0707  . OLANZapine zydis (ZYPREXA) disintegrating tablet 5 mg  5 mg Oral Daily Nita Sells, MD      . ondansetron (ZOFRAN) tablet 4 mg  4 mg Oral Q6H PRN Phillips Grout, MD       Or  . ondansetron (ZOFRAN) injection 4 mg  4 mg Intravenous Q6H PRN Phillips Grout, MD      . pantoprazole (PROTONIX) EC tablet 20 mg  20 mg Oral q morning - 10a Nita Sells, MD   20 mg at 06/14/14 1040  . sodium chloride 0.9 % injection 3 mL  3 mL Intravenous Q12H Phillips Grout, MD   3 mL at 06/14/14 1043    Musculoskeletal: Strength & Muscle Tone: decreased Gait & Station: unable to stand Patient leans: N/A  Psychiatric Specialty Exam: Physical Exam as per history and physical   ROS agitation, aggression, cognitive deficits   Blood pressure 137/76, pulse 70, temperature 97.6 F (36.4 C), temperature source Oral, resp. rate 20, height 5\' 10"  (1.778 m), weight 105.235 kg (232 lb), SpO2 100 %.Body mass index is 33.29 kg/(m^2).  General Appearance: Disheveled and Guarded  Eye Contact::   Minimal  Speech:  Slow and Slurred  Volume:  Decreased  Mood:  Irritable  Affect:  Constricted  Thought Process:  Disorganized, Irrelevant and Loose  Orientation:  Negative  Thought Content:  Delusions and Rumination  Suicidal Thoughts:  No  Homicidal Thoughts:  No  Memory:  Immediate;   Poor Recent;   Poor  Judgement:  Impaired  Insight:  Lacking  Psychomotor Activity:  Restlessness  Concentration:  Poor  Recall:  Poor  Fund of Knowledge:NA  Language: Poor  Akathisia:  Negative  Handed:  Right  AIMS (if indicated):     Assets:  Financial Resources/Insurance Housing Intimacy Leisure Time Social Support  ADL's:  Impaired  Cognition: Impaired,  Moderate  Sleep:      Medical Decision Making: New problem, with additional work up planned, Review of Psycho-Social Stressors (1), Review or order clinical lab tests (1), Discuss test with performing physician (1), Established Problem, Worsening (2), Review or order medicine tests (1), Review of Medication Regimen & Side Effects (2) and Review of New Medication or Change in Dosage (2)  Treatment Plan Summary: Daily contact with patient to assess and evaluate symptoms and progress in treatment and Medication management  Plan:  Patient CT scan of the brain indicates no intracranial pathology Patient may benefit from the MRI scan of the brain to rule out neuropathology Continue current home medication without changes We start Zyprexa Zydis 5 mg twice daily for agitation and poor appetite Recommend psychiatric Inpatient admission when medically cleared. Supportive therapy provided about ongoing stressors.  Appreciate psychiatric consultation and  follow up as clinically required Please contact [phone] or [phone] if needs further assistance  Disposition: Patient may benefit from geriatric psychiatry placement when medically stable.  [name] [name],[name] R. 06/15/2014 9:05 AM

## 2014-06-15 NOTE — Progress Notes (Signed)
Patient continue to be agitated, swinging and pulling tubes, PRN ativan given without much effect. Dr. Verlon Au and Dr. Louis Meckel aware, patient still on restraint, Spoke to Dr. Louis Meckel about patient condition and cystogram and he stated he will hold on cystogram today, family at the bedside and Dr. Louis Meckel spoke to them on the phone as well. Will continue to assess patient.

## 2014-06-15 NOTE — Progress Notes (Signed)
Patient continue to get confuse and agitated, pulling tubes, kicking and swinging, continue to need restraint, and frequent assessment and reorientation. Family at the bedside with patient, will F/U with plan of care.

## 2014-06-15 NOTE — Progress Notes (Signed)
Clinical Social Work Department CLINICAL SOCIAL WORK PSYCHIATRY SERVICE LINE ASSESSMENT 06/15/2014  Patient:  [name] [name]  Account:  [id number]  Admit Date:  06/12/2014  Clinical Social Worker:  Peri Maris, CLINICAL SOCIAL WORKER  Date/Time:  06/15/2014 11:00 AM Referred by:  Physician  Date referred:  06/15/2014 Reason for Referral  Behavioral Health Issues   Presenting Symptoms/Problems (In the person's/family's own words):   Confusion, agitation, hallucinations   Abuse/Neglect/Trauma History (check all that apply)  Denies history   Abuse/Neglect/Trauma Comments:   N/A   Psychiatric History (check all that apply)  Denies history   Psychiatric medications:  Lexapro 20mg   Zyprexa 5mg    Current Mental Health Hospitalizations/Previous Mental Health History:   Pt denies   Current provider:   PCP   Place and Date:   N/A   Current Medications:   Scheduled Meds:      . aspirin EC  325 mg Oral Daily  . cefTRIAXone (ROCEPHIN)  IV  1 g Intravenous Q24H  . donepezil  10 mg Oral QHS  . escitalopram  20 mg Oral Daily  . hydrocortisone sod succinate (SOLU-CORTEF) inj  50 mg Intravenous Daily  . levothyroxine  150 mcg Oral QAC breakfast  . OLANZapine zydis  5 mg Oral Daily  . pantoprazole  20 mg Oral q morning - 10a  . sodium chloride  3 mL Intravenous Q12H        Continuous Infusions:      PRN Meds:.hyoscyamine, LORazepam, ondansetron **OR** ondansetron (ZOFRAN) IV       Previous Impatient Admission/Date/Reason:   N/A   Emotional Health / Current Symptoms    Suicide/Self Harm  None reported   Suicide attempt in the past:   N/A   Other harmful behavior:   N/A   Psychotic/Dissociative Symptoms  Auditory Hallucinations  Confusion  Inability to care for self  Visual Hallucinations   Other Psychotic/Dissociative Symptoms:   Pt's family reports that Pt has been seeing and talking to people that are not actually present. They also reported an increase in Pt's  confusion and agitation in the last week.    Attention/Behavioral Symptoms  Within Normal Limits   Other Attention / Behavioral Symptoms:   N/A    Cognitive Impairment  Long-Term Memory Impairment   Other Cognitive Impairment:   Pt was diagnosed with dementia 2 years ago and the severity is categorized as mild to moderate.    Mood and Adjustment  Confused  Paranoia    Stress, Anxiety, Trauma, Any Recent Loss/Stressor  Other - See comment   Anxiety (frequency):   Phobia (specify):   Compulsive behavior (specify):   Obsessive behavior (specify):   Other:   Pt has undergone/experienced 4 medical complications or procedures in the last 6 months, per family, including a heart attack and kidney removal.   Substance Abuse/Use  None   SBIRT completed (please refer for detailed history):  N  Self-reported substance use:   N/A   Urinary Drug Screen Completed:  N Alcohol level:   N/A    Environmental/Housing/Living Arrangement  Stable housing   Who is in the home:   Lives alone but Pt's daughter-in-law reports staying with him during the day as well has a sitter being there at night.   Emergency contact:  Vedia Coffer, son   Financial  Medicare   Patient's Strengths and Goals (patient's own words):   Pt was unable to participate in assessment. Pt's family reports that prior to this week, despite dementia diagnosis,  Pt was able to cook for himself and do most of his ADLs. Also, Pt has a supportive family who are involved in his care. Pt is generally compliant with medications.   Clinical Social Worker's Interpretive Summary:   Pt was unable to participate in assessment, however information was obtained from Pt's son and daughter-in-law. Per family, Pt has no prior mental health history. As previously noted, Pt has had 4 major complications in the last 6 months, most recently the removal of a kidney; he was discharged from the hospital on 2/28 but was readmitted on 3/2  with increased confusion, agitation, and hallucinations. Pt's family report that his change in behavior was abrupt and unexpected. They report that Pt was having around the clock care and supervision up until this most recent hospitalization.    Pt's family expressed being concerned about this change in behavior and Pt's rapid decline in the last week. Pt's family activley involved in his care at the hospital and at home.    Psych MD to start Zyprexa; will continue monitor Pt and adjust medicaitons as needed. CSW will continue to follow to assist with discharge planning psych needs as appropriate.   Disposition:  Recommend Psych CSW continuing to support while in hospital  Peri Maris, [facility] 06/15/2014 2:31 PM

## 2014-06-15 NOTE — Progress Notes (Signed)
[name] [name] TDV:[id number] DOB: [birth year] DOA: 06/12/2014 PCP: Purvis Kilts, MD  Brief narrative: 34 ? Mod Dementia, OSA/OHSS on CPAP, STEMI s/p cath 12/17/13 + CAGB, Orthostatic hypotension on both Midodrine + florinef. 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 Louis Meckel 06/05/14 admitted from [facility] 06/12/14 with toxic metabolic encephalopathy in the setting of progressive worsening of renal function Apparently patient had done well status post surgery and was discharged~2/28 he did well initially but on 3/2 it was noted that he had decreased sleep, increased agitation, and more confusion than usual. He has been diagnosed with dementia~2 years ago and had been placed on Aricept-his family confirm his dementia is mild to moderate- he was still able to cook for himself prior to his CABG and does need some assistance with daily but for the most part is pretty independent Urology was consulted with regards to his recent surgery as this may been a postoperative complication. Was found to have Acute toxic metabolic encephalopathy and psychaitry as well as Neurology So far no known cause  Past medical history-As per Problem list Chart reviewed as below-   Consultants:  Urology  Procedures:  Ultrasound renal pending  Antibiotics:  Rocephin 3/4   Subjective   Completely calm last night after Zydis/Geodon Agitated earlier todaty Risk to himself and therefore restraints replaced    Objective    Interim History:   Telemetry: QTc 0.51   Objective: Filed Vitals:   06/13/14 2113 06/14/14 0537 06/15/14 0544 06/15/14 1425  BP: 119/70 140/74 137/76 152/80  Pulse: 78 72 70 85  Temp: 97.3 F (36.3 C) 98.1 F (36.7 C) 97.6 F (36.4 C) 97.4 F  (36.3 C)  TempSrc: Oral Oral Oral Oral  Resp: 20 19 20 20   Height:      Weight:      SpO2: 95% 96% 100% 100%    Intake/Output Summary (Last 24 hours) at 06/15/14 1443 Last data filed at 06/15/14 0600  Gross per 24 hour  Intake  612.5 ml  Output    600 ml  Net   12.5 ml    Exam:  General:  Confused but redirectable to some excitable Cardiovascular:  S1-S2 no murmur rub or gallop Respiratory:  Clinically clear no added sound Abdomen:  Multiple abdominal scars however no rebound or guarding SNo lower extremity edema NConfused but moving 4 limbs purposefully on command  Data Reviewed: Basic Metabolic Panel:  Recent Labs Lab 06/12/14 1854 06/13/14 0650 06/14/14 1105 06/15/14 0550  NA 137 138 140 143  K 3.2* 3.6 3.4* 3.7  CL 102 104 108 109  CO2 26 30 27 28   GLUCOSE 96 93 137* 82  BUN 26* 24* 18 19  CREATININE 2.15* 2.07* 1.71* 1.82*  CALCIUM 9.0 8.4 8.6 8.6   Liver Function Tests:  Recent Labs Lab 06/13/14 0650 06/14/14 1105 06/15/14 0550  AST 35 38* 35  ALT 16 18 18   ALKPHOS 53 49 51  BILITOT 0.7 0.5 0.5  PROT 6.2 5.9* 5.6*  ALBUMIN 3.2* 3.1* 2.7*   No results for input(s): LIPASE, AMYLASE in the last 168 hours.  Recent Labs Lab 06/13/14 1150  AMMONIA 12   CBC:  Recent Labs Lab 06/12/14 1854 06/14/14 1105 06/15/14 0550  WBC 11.1*  10.5 10.3  NEUTROABS 7.6 9.4* 7.0  HGB 10.4* 10.4* 9.8*  HCT 32.6* 32.2* 31.0*  MCV 87.9 88.5 90.1  PLT 260 290 261   Cardiac Enzymes:  Recent Labs Lab 06/12/14 1854 06/12/14 2125 06/13/14 0039 06/13/14 0650 06/13/14 1154  TROPONINI 0.31* 0.27* 0.25* 0.23* 0.20*   BNP: Invalid input(s): POCBNP CBG:  Recent Labs Lab 06/13/14 0051 06/13/14 0739  GLUCAP 93 90    Recent Results (from the past 240 hour(s))  Urine culture     Status: None   Collection Time: 06/12/14  6:45 PM  Result Value Ref Range Status   Specimen Description URINE, CATHETERIZED  Final   Special Requests NONE  Final   Colony  Count NO GROWTH Performed at [facility]   Final   Culture NO GROWTH Performed at [facility]   Final   Report Status 06/15/2014 FINAL  Final  Blood culture (routine x 2)     Status: None (Preliminary result)   Collection Time: 06/12/14  9:25 PM  Result Value Ref Range Status   Specimen Description BLOOD LEFT ARM  Final   Special Requests BOTTLES DRAWN AEROBIC AND ANAEROBIC 6CC  Final   Culture NO GROWTH 3 DAYS  Final   Report Status PENDING  Incomplete  Blood culture (routine x 2)     Status: None (Preliminary result)   Collection Time: 06/12/14  9:30 PM  Result Value Ref Range Status   Specimen Description BLOOD LEFT HAND  Final   Special Requests BOTTLES DRAWN AEROBIC AND ANAEROBIC 6CC  Final   Culture NO GROWTH 3 DAYS  Final   Report Status PENDING  Incomplete  Urine culture     Status: None   Collection Time: 06/13/14  9:50 AM  Result Value Ref Range Status   Specimen Description URINE, CATHETERIZED  Final   Special Requests NONE  Final   Colony Count NO GROWTH Performed at [facility]   Final   Culture NO GROWTH Performed at [facility]   Final   Report Status 06/14/2014 FINAL  Final     Studies:              All Imaging reviewed and is as per above notation   Scheduled Meds: . aspirin EC  325 mg Oral Daily  . cefTRIAXone (ROCEPHIN)  IV  1 g Intravenous Q24H  . donepezil  10 mg Oral QHS  . escitalopram  20 mg Oral Daily  . hydrocortisone sod succinate (SOLU-CORTEF) inj  50 mg Intravenous Daily  . levothyroxine  150 mcg Oral QAC breakfast  . OLANZapine zydis  5 mg Oral Daily  . pantoprazole  20 mg Oral q morning - 10a  . sodium chloride  3 mL Intravenous Q12H   Continuous Infusions:     Assessment/Plan:  1. Toxic metabolic encephalopathy with delirium-potentially secondary to hypoxia as well as [pain meds, Aricept, Vesicare, Lexapro] in a setting of rising creatinine status post robotic right laparoscopic  nephroureterectomy-urology's opinion is that rising creatinine is expected and urine analysis would be expected to contain TNTC WBC--continue ceftriaxone until cultures return. Ammonia 12, TSH elevated as below.  His family tells me prior to discharge from surgery he was having "hallucinations"-- they sound more like delusion. psych consult appreciated-Dr. Louis Meckel and I had a long talk-his TCC was non-metastatic--MRI reasonable if no improvement--WOULD NOT sedate unless absolutely necessary.  appreciate Neurology input 2. ? Pyelonephritis-see above discussion 3. Demand ischemia-troponin trending downward EKG on admission benign-no  further workup required 4. Recent CABG 12/17/13 -cannot give beta blocker as history orthostasis, ACE inhibitor relatively contraindicated given renal failure . Continue aspirin 325 daily. D/c statin 5. Elevated TSH-unclear how to interpret in setting of recent surgery. Nonetheless thyroxine dosage adjusted 3/5. 6. Polypharmacy-some evidence to suggest anticholinesterase inhibitors can worsen delirium such as Aricept-after my discussion with Dr. Earley Brooke was recommended to restart his Lexapro 20 and Aricept 10 on 3/6. 7. Acute renal insufficiency with mild hypokalemia 3.2 on admission Estimated Creatinine Clearance: 40.6 mL/min (by C-G formula based on Cr of 1.82).-continue saline75 cc per hour with 20 of K repeat labs in a.m. GFR is improved from 35-43 8. History of orthostatic hypotension-Midodrin/Florinef on hold for now  9. Reported history of adrenal rapid wean of Solu-Cortef from 50 every 8 to 50 every 12 and then 50 daily--steroids can cause delirium as well  10. OHSS/OSA-continue CPAP at night if he is able to tolerate  Code Status: Full Family Communication: Detailed discussion about side with family Disposition Plan: Inpatient  25 minutes   Verneita Griffes, MD  [facility] Pager [phone] 06/15/2014, 2:43 PM    LOS: 3 days

## 2014-06-15 NOTE — Consult Note (Signed)
Reason for Consult:Altered mental status Referring Physician: Verlon Au  CC: Confusion  HPI: [name] [name] is an 81 y.o. male with a history of mild to moderate dementia who underwent CABG in September of 2015.  After surgery had some confusion, was not speaking and was having hallucinations.  This cleared and in a few months underwent bladder surgery for bladder CA.  Seemed to do well after this surgery.  Underwent nephroureterectomy at the end of February.  Seemed to be fine right after surgery but had a slow decline that led to admission on 3/4.  Patient confused, uncooperative and combative at that time.  Has remained altered.    Past Medical History  Diagnosis Date  . Chronic constipation   . GERD (gastroesophageal reflux disease)   . Arthritis   . Hyperlipidemia   . Orthostatic hypotension 11/19/2013  . Coronary artery disease     CARDIOLOGIST-  DR BERRY  --  HX  STEMI  12-17-2013  S/P  CABG X4  12-18-2013  . Mild dementia   . Type 2 diabetes mellitus   . Hypothyroidism   . History of esophageal dilatation   . History of gastritis   . History of hiatal hernia   . History of bladder cancer     hx  TCC low grade  . Prostate cancer     low-grade  . Kidney tumor     right  . BPH (benign prostatic hyperplasia)   . CKD (chronic kidney disease) stage 3, GFR 30-59 ml/min   . History of kidney stones   . ED (erectile dysfunction)   . S/P CABG x 4     12-18-2013  . Bladder tumor   . Complication of anesthesia     HARD TO WAKE  . OSA on CPAP     mild osa   per sleep study 07-11-2009- patient states does not use regularly    Past Surgical History  Procedure Laterality Date  . Upper gastrointestinal endoscopy  04/06/2010  &  01-08-2013    w/ esophageal dilatation  . Hemorrhoid surgery    . Hand surgery      right  . Wrist fracture surgery      pins placed-left  . Cystoscopy with biopsy  01/09/2012    Procedure: CYSTOSCOPY WITH BIOPSY;  Surgeon: Marissa Nestle, MD;   Location: [facility] ORS;  Service: Urology;  Laterality: N/A;  Cystoscopy with Multiple Bladder Biopsies  . Esophagogastroduodenoscopy (egd) with esophageal dilation N/A 01/08/2013    Procedure: ESOPHAGOGASTRODUODENOSCOPY (EGD) WITH ESOPHAGEAL DILATION;  Surgeon: Rogene Houston, MD;  Location: [facility] ENDO SUITE;  Service: Endoscopy;  Laterality: N/A;  120  . Left heart cath N/A 12/17/2013    Procedure: LEFT HEART CATH;  Surgeon: Leonie Man, MD;  Location[facility] C[facility] LAB;  Service: Cardiovascular;  Laterality: N/A;  . Left heart catheterization with coronary angiogram N/A 12/17/2013    Procedure: LEFT HEART CATHETERIZATION WITH CORONARY ANGIOGRAM;  Surgeon: Leonie Man, MD;  Location: [facility] CATH LAB;  Service: Cardiovascular;  Laterality: N/A;  . Colonoscopy with esophagogastroduodenoscopy (egd)  08-15-2006    w/ esophageal dilatation  . Umbilical hernia repair  04-24-2003  . Transurethral resection of bladder tumor  06-14-2004  . Transurethral resection of prostate  10-04-2004  . Cysto/  removal bladder calculus/  resection bladder tumor  10-16-2006  . Cystostomy w/ bladder biopsy  01-09-2012  . Cardiac catheterization  12-17-2013   dr Shanon Brow harding    severe disease RCA with thrombotic  appearing subtotal occlusions/  95-99%  OM2/  diffuse disease LAD  and D1/  mild to moderate basal to mid inferior hypokinesis/  severe elevated LVEDP/  ef 45-50%  . Coronary artery bypass graft N/A 12/18/2013    Procedure: Coronary artery bypass grafting times four using left internal mammary artery and endoscopically harvested right saphenous vein graft;  Surgeon: Gaye Pollack, MD;  Location: [facility] OR;  Service: Open Heart Surgery;  Laterality: N/A;  . Transthoracic echocardiogram  11-14-2012    grade I diastolic dysfunction/  ef 55-60%/  trivial MR  and TR  . Cardiovascular stress test  08-09-2009  dr berry    mild to moderate perfusion defect in basal , mid, and apical inferior regions consistent with an infart/scar/   No  evidence ischemia/  ef 51%/  no ECG changes/   low risk scan  . Transurethral resection of bladder tumor N/A 04/07/2014    Procedure: TRANSURETHRAL RESECTION OF BLADDER TUMOR (TURBT);  Surgeon: Arvil Persons, MD;  Location: [facility];  Service: Urology;  Laterality: N/A;  . Cystoscopy/retrograde/ureteroscopy Right 04/07/2014    Procedure: CYSTOSCOPY/RETROGRADE/URETEROSCOPY WITH BIOPSY OF RENAL PELVIS TUMOR;  Surgeon: Arvil Persons, MD;  Location: [facility];  Service: Urology;  Laterality: Right;  . Robot assisted laparoscopic nephrectomy Right 06/05/2014    Procedure: ROBOTIC ASSISTED LAPAROSCOPIC RIGHT NEPHROURETERECTOMY, VENTRAL HERNIA REPAIR ;  Surgeon: Ardis Hughs, MD;  Location: [facility] ORS;  Service: Urology;  Laterality: Right;  . Cystoscopy w/ ureteral stent placement Right 06/05/2014    Procedure: CYSTOSCOPY ;  Surgeon: Ardis Hughs, MD;  Location: [facility] ORS;  Service: Urology;  Laterality: Right;    Family History  Problem Relation Age of Onset  . Cancer - Lung Brother     Social History:  reports that he has quit smoking. His smoking use included Cigarettes. He has a 4 pack-year smoking history. He quit smokeless tobacco use about 54 years ago. His smokeless tobacco use included Chew. He reports that he does not drink alcohol or use illicit drugs.  Allergies  Allergen Reactions  . Statins Other (See Comments)    MUSCLE ACHES    Medications:  I have reviewed the patient's current medications. Scheduled: . aspirin EC  325 mg Oral Daily  . cefTRIAXone (ROCEPHIN)  IV  1 g Intravenous Q24H  . donepezil  10 mg Oral QHS  . escitalopram  20 mg Oral Daily  . levothyroxine  150 mcg Oral QAC breakfast  . OLANZapine zydis  5 mg Oral Daily  . pantoprazole  20 mg Oral q morning - 10a  . sodium chloride  3 mL Intravenous Q12H    ROS: History obtained from family  General ROS: poor po intake Psychological ROS: as noted in HPI Ophthalmic ROS:  negative for - blurry vision, double vision, eye pain or loss of vision ENT ROS: negative for - epistaxis, nasal discharge, oral lesions, sore throat, tinnitus or vertigo Allergy and Immunology ROS: negative for - hives or itchy/watery eyes Hematological and Lymphatic ROS: negative for - bleeding problems, bruising or swollen lymph nodes Endocrine ROS: negative for - galactorrhea, hair pattern changes, polydipsia/polyuria or temperature intolerance Respiratory ROS: negative for - cough, hemoptysis, shortness of breath or wheezing Cardiovascular ROS: negative for - chest pain, dyspnea on exertion, edema or irregular heartbeat Gastrointestinal ROS: negative for - abdominal pain, diarrhea, hematemesis, nausea/vomiting or stool incontinence Genito-Urinary ROS: negative for - dysuria, hematuria, incontinence or urinary frequency/urgency Musculoskeletal ROS: negative  for - joint swelling or muscular weakness Neurological ROS: as noted in HPI Dermatological ROS: negative for rash and skin lesion changes  Physical Examination: Blood pressure 152/80, pulse 85, temperature 97.4 F (36.3 C), temperature source Oral, resp. rate 20, height 5\' 10"  (1.778 m), weight 105.235 kg (232 lb), SpO2 100 %.  HEENT-  Normocephalic, no lesions, without obvious abnormality.  Normal external eye and conjunctiva.  Normal TM's bilaterally.  Normal auditory canals and external ears. Normal external nose, mucus membranes and septum.  Normal pharynx. Cardiovascular- S1, S2 normal, pulses palpable throughout   Lungs- chest clear, no wheezing, rales, normal symmetric air entry Abdomen- soft, non-tender; bowel sounds normal; no masses,  no organomegaly Extremities- no edema Lymph-no adenopathy palpable Musculoskeletal-no joint tenderness, deformity or swelling Skin-warm and dry, no hyperpigmentation, vitiligo, or suspicious lesions  Neurological Examination Mental Status: With eyes closed on initial examination and without  stimulation will go to sleep.  On calling name will alert and respond appropriately.  Will not respond to orientation questioning.  Does not follow commands other than hand grips to command.  Speech fluent without evidence of aphasia but does exhibit some slurring.   Cranial Nerves: II: Discs flat bilaterally; Does not respond to visual confrontation from the left.  Pupils equal, round, reactive to light and accommodation III,IV, VI: ptosis not present, extra-ocular motions intact bilaterally V,VII: decrease in the right NLF, facial light touch sensation normal bilaterally VIII: hearing normal bilaterally IX,X: gag reflex present XI: bilateral shoulder shrug XII: unable to test Motor: Right : Upper extremity   5/5    Left:     Upper extremity   5/5  Lower extremity   5/5     Lower extremity   5/5 Tone and bulk:normal tone throughout; no atrophy noted Sensory: Responds to tactile stimuli in all extremities Deep Tendon Reflexes: 2+ and symmetric throughout Plantars: Right: downgoing   Left: downgoing Cerebellar: Unable to test Gait: Unable to test secondary to patient being in four point restraints    Laboratory Studies:   Basic Metabolic Panel:  Recent Labs Lab 06/12/14 1854 06/13/14 0650 06/14/14 1105 06/15/14 0550  NA 137 138 140 143  K 3.2* 3.6 3.4* 3.7  CL 102 104 108 109  CO2 26 30 27 28   GLUCOSE 96 93 137* 82  BUN 26* 24* 18 19  CREATININE 2.15* 2.07* 1.71* 1.82*  CALCIUM 9.0 8.4 8.6 8.6    Liver Function Tests:  Recent Labs Lab 06/13/14 0650 06/14/14 1105 06/15/14 0550  AST 35 38* 35  ALT 16 18 18   ALKPHOS 53 49 51  BILITOT 0.7 0.5 0.5  PROT 6.2 5.9* 5.6*  ALBUMIN 3.2* 3.1* 2.7*   No results for input(s): LIPASE, AMYLASE in the last 168 hours.  Recent Labs Lab 06/13/14 1150  AMMONIA 12    CBC:  Recent Labs Lab 06/12/14 1854 06/14/14 1105 06/15/14 0550  WBC 11.1* 10.5 10.3  NEUTROABS 7.6 9.4* 7.0  HGB 10.4* 10.4* 9.8*  HCT 32.6* 32.2*  31.0*  MCV 87.9 88.5 90.1  PLT 260 290 261    Cardiac Enzymes:  Recent Labs Lab 06/12/14 1854 06/12/14 2125 06/13/14 0039 06/13/14 0650 06/13/14 1154  TROPONINI 0.31* 0.27* 0.25* 0.23* 0.20*    BNP: Invalid input(s): POCBNP  CBG:  Recent Labs Lab 06/13/14 0051 06/13/14 0739  GLUCAP 93 24    Microbiology: Results for orders placed or performed during the hospital encounter of 06/12/14  Urine culture     Status: None  Collection Time: 06/12/14  6:45 PM  Result Value Ref Range Status   Specimen Description URINE, CATHETERIZED  Final   Special Requests NONE  Final   Colony Count NO GROWTH Performed at [facility]   Final   Culture NO GROWTH Performed at [facility]   Final   Report Status 06/15/2014 FINAL  Final  Blood culture (routine x 2)     Status: None (Preliminary result)   Collection Time: 06/12/14  9:25 PM  Result Value Ref Range Status   Specimen Description BLOOD LEFT ARM  Final   Special Requests BOTTLES DRAWN AEROBIC AND ANAEROBIC 6CC  Final   Culture NO GROWTH 3 DAYS  Final   Report Status PENDING  Incomplete  Blood culture (routine x 2)     Status: None (Preliminary result)   Collection Time: 06/12/14  9:30 PM  Result Value Ref Range Status   Specimen Description BLOOD LEFT HAND  Final   Special Requests BOTTLES DRAWN AEROBIC AND ANAEROBIC 6CC  Final   Culture NO GROWTH 3 DAYS  Final   Report Status PENDING  Incomplete  Urine culture     Status: None   Collection Time: 06/13/14  9:50 AM  Result Value Ref Range Status   Specimen Description URINE, CATHETERIZED  Final   Special Requests NONE  Final   Colony Count NO GROWTH Performed at [facility]   Final   Culture NO GROWTH Performed at [facility]   Final   Report Status 06/14/2014 FINAL  Final    Coagulation Studies: No results for input(s): LABPROT, INR in the last 72 hours.  Urinalysis:  Recent Labs Lab 06/12/14 1845  COLORURINE YELLOW   LABSPEC 1.015  PHURINE 6.5  GLUCOSEU NEGATIVE  HGBUR LARGE*  BILIRUBINUR NEGATIVE  KETONESUR NEGATIVE  PROTEINUR 30*  UROBILINOGEN 0.2  NITRITE NEGATIVE  LEUKOCYTESUR MODERATE*    Lipid Panel:  No results found for: CHOL, TRIG, HDL, CHOLHDL, VLDL, LDLCALC  HgbA1C:  Lab Results  Component Value Date   HGBA1C 6.7* 12/18/2013    Urine Drug Screen:     Component Value Date/Time   LABOPIA NONE DETECTED 09/20/2009 1917   COCAINSCRNUR NONE DETECTED 09/20/2009 1917   LABBENZ NONE DETECTED 09/20/2009 1917   AMPHETMU NONE DETECTED 09/20/2009 1917   THCU NONE DETECTED 09/20/2009 1917   LABBARB  09/20/2009 1917    NONE DETECTED        DRUG SCREEN FOR MEDICAL PURPOSES ONLY.  IF CONFIRMATION IS NEEDED FOR ANY PURPOSE, NOTIFY LAB WITHIN 5 DAYS.        LOWEST DETECTABLE LIMITS FOR URINE DRUG SCREEN Drug Class       Cutoff (ng/mL) Amphetamine      1000 Barbiturate      200 Benzodiazepine   315 Tricyclics       176 Opiates          300 Cocaine          300 THC              50    Alcohol Level: No results for input(s): ETH in the last 168 hours.  Other results: EKG: normal sinus rhythm at 74 bpm.  Imaging: CT head without contrast:  IMPRESSION: 1. No acute intracranial abnormality. 2. Sinus disease. 3. Mild small vessel ischemic change   Assessment/Plan: 81 year old male with altered mental status.  Renal function has been impaired.  TSH elevated.  Initially white blood cell count was elevated  as well.  Patient has also now been under general anesthesia three times in the past 6 months.  There has been poor po intake as well.   Lab work is slowly trending back to baseline.  Patient with an underlying dementia and therefore more susceptible to cognitive deficits from metabolic derangements.  Cognitive function will lag behind improvement in metabolic derangements.  With cardiac history can not rule out an ischemic event as well.  Neurological examination does show some mild  focality.    Recommendations: 1.  B12, B1 2.  Will continue to follow the patient clinically for the next few days and will not order further imaging at this time.  Patient will require sedation for an MRI and would not like to expose the patient to that at this time since little that can actually be reversed is likely to be revealed.   3.  Agree with thyroid supplementation.   4.  Agree with continued addressing of metabolic issues.     Case discussed with Dr. Hinton Dyer, MD [facility] [phone] 06/15/2014, 3:24 PM

## 2014-06-15 NOTE — Progress Notes (Addendum)
Shift Event:  At bedside to evaluate pt. VSS, on 2L  with 100% O2 sats. Pt is asleep, in NAD. On 4 point restraints. Heart-RRR. Lungs- CTAB. Abd- +BS, ntnd  Update: spoke with RN who stated pt required 2mg  IV Ativan overnight and 2mg  Ativan this am for increased agitation  Lacy Duverney [facility]

## 2014-06-16 LAB — VITAMIN B1: VITAMIN B1 (THIAMINE): 109.7 nmol/L (ref 66.5–200.0)

## 2014-06-16 NOTE — Progress Notes (Signed)
Patient remains restrained and bilateral hand mitts secure. Nocturnal CPAP not placed due to confusion, aggitation and the inability to remove the mask himself. RN aware. RT will continue to follow.

## 2014-06-16 NOTE — OR Nursing (Signed)
Addendum made to wound class as directed by Arta Bruce, Infection Prevention.

## 2014-06-16 NOTE — Progress Notes (Signed)
Subjective: Patient more communicative today but remains confused.    Objective: Current vital signs: BP 161/76 mmHg  Pulse 80  Temp(Src) 98.4 F (36.9 C) (Oral)  Resp 20  Ht 5\' 10"  (1.778 m)  Wt 101 kg (222 lb 10.6 oz)  BMI 31.95 kg/m2  SpO2 99% Vital signs in last 24 hours: Temp:  [97.4 F (36.3 C)-98.5 F (36.9 C)] 98.4 F (36.9 C) (03/08 0451) Pulse Rate:  [76-85] 80 (03/08 0451) Resp:  [20] 20 (03/08 0451) BP: (152-161)/(64-80) 161/76 mmHg (03/08 0451) SpO2:  [99 %-100 %] 99 % (03/08 0451) Weight:  [101 kg (222 lb 10.6 oz)] 101 kg (222 lb 10.6 oz) (03/08 0451)  Intake/Output from previous day: 03/07 0701 - 03/08 0700 In: 430 [P.O.:380; IV Piggyback:50] Out: 1500 [Urine:1500] Intake/Output this shift: Total I/O In: 63 [P.O.:60; I.V.:3] Out: 450 [Urine:450] Nutritional status: Diet full liquid  Neurologic Exam: Mental Status: With eyes closed on initial examination and without stimulation will go to sleep. On calling name will alert and respond and stay awake better than on yesterday. When asked where he is reports the name of a street.  Thinks his grandson is in the room.  Follows simple commands.   Speech fluent without evidence of aphasia but does exhibit some slurring.  Cranial Nerves: II: Discs flat bilaterally; Blinks to bilateral confrontation. Pupils equal, round, reactive to light and accommodation III,IV, VI: ptosis not present, extra-ocular motions intact bilaterally V,VII: decrease in the right NLF, facial light touch sensation normal bilaterally VIII: hearing normal bilaterally IX,X: gag reflex present XI: bilateral shoulder shrug XII: unable to test Motor: Right :Upper extremity 5/5Left: Upper extremity 5/5 Lower extremity 5/5Lower extremity 5/5 Tone and bulk:normal tone throughout; no atrophy noted Sensory: Responds to tactile  stimuli in all extremities Deep Tendon Reflexes: 2+ and symmetric throughout Plantars: Right: downgoingLeft: downgoing Cerebellar: Unable to test Gait: Unable to test secondary to patient being in four point restraints  Lab Results: Basic Metabolic Panel:  Recent Labs Lab 06/12/14 1854 06/13/14 0650 06/14/14 1105 06/15/14 0550  NA 137 138 140 143  K 3.2* 3.6 3.4* 3.7  CL 102 104 108 109  CO2 26 30 27 28   GLUCOSE 96 93 137* 82  BUN 26* 24* 18 19  CREATININE 2.15* 2.07* 1.71* 1.82*  CALCIUM 9.0 8.4 8.6 8.6    Liver Function Tests:  Recent Labs Lab 06/13/14 0650 06/14/14 1105 06/15/14 0550  AST 35 38* 35  ALT 16 18 18   ALKPHOS 53 49 51  BILITOT 0.7 0.5 0.5  PROT 6.2 5.9* 5.6*  ALBUMIN 3.2* 3.1* 2.7*   No results for input(s): LIPASE, AMYLASE in the last 168 hours.  Recent Labs Lab 06/13/14 1150  AMMONIA 12    CBC:  Recent Labs Lab 06/12/14 1854 06/14/14 1105 06/15/14 0550  WBC 11.1* 10.5 10.3  NEUTROABS 7.6 9.4* 7.0  HGB 10.4* 10.4* 9.8*  HCT 32.6* 32.2* 31.0*  MCV 87.9 88.5 90.1  PLT 260 290 261    Cardiac Enzymes:  Recent Labs Lab 06/12/14 1854 06/12/14 2125 06/13/14 0039 06/13/14 0650 06/13/14 1154  TROPONINI 0.31* 0.27* 0.25* 0.23* 0.20*    Lipid Panel: No results for input(s): CHOL, TRIG, HDL, CHOLHDL, VLDL, LDLCALC in the last 168 hours.  CBG:  Recent Labs Lab 06/13/14 0051 06/13/14 0739  GLUCAP 93 90    Microbiology: Results for orders placed or performed during the hospital encounter of 06/12/14  Urine culture     Status: None   Collection Time:  06/12/14  6:45 PM  Result Value Ref Range Status   Specimen Description URINE, CATHETERIZED  Final   Special Requests NONE  Final   Colony Count NO GROWTH Performed at [facility]   Final   Culture NO GROWTH Performed at [facility]   Final   Report Status 06/15/2014 FINAL  Final  Blood culture (routine x 2)     Status:  None (Preliminary result)   Collection Time: 06/12/14  9:25 PM  Result Value Ref Range Status   Specimen Description BLOOD LEFT ARM  Final   Special Requests BOTTLES DRAWN AEROBIC AND ANAEROBIC 6CC  Final   Culture NO GROWTH 3 DAYS  Final   Report Status PENDING  Incomplete  Blood culture (routine x 2)     Status: None (Preliminary result)   Collection Time: 06/12/14  9:30 PM  Result Value Ref Range Status   Specimen Description BLOOD LEFT HAND  Final   Special Requests BOTTLES DRAWN AEROBIC AND ANAEROBIC 6CC  Final   Culture NO GROWTH 3 DAYS  Final   Report Status PENDING  Incomplete  Urine culture     Status: None   Collection Time: 06/13/14  9:50 AM  Result Value Ref Range Status   Specimen Description URINE, CATHETERIZED  Final   Special Requests NONE  Final   Colony Count NO GROWTH Performed at [facility]   Final   Culture NO GROWTH Performed at [facility]   Final   Report Status 06/14/2014 FINAL  Final    Coagulation Studies: No results for input(s): LABPROT, INR in the last 72 hours.  Imaging: No results found.  Medications:  I have reviewed the patient's current medications. Scheduled: . aspirin EC  325 mg Oral Daily  . cefTRIAXone (ROCEPHIN)  IV  1 g Intravenous Q24H  . donepezil  10 mg Oral QHS  . escitalopram  20 mg Oral Daily  . levothyroxine  150 mcg Oral QAC breakfast  . OLANZapine zydis  5 mg Oral BID  . pantoprazole  20 mg Oral q morning - 10a  . sodium chloride  3 mL Intravenous Q12H    Assessment/Plan: Patient remains confused.  No significant improvement noted.  Lab work essentially unchanged.    Recommendations: 1.  Will continue to follow with you   LOS: 4 days   Alexis Goodell, MD [facility] [phone] 06/16/2014  12:10 PM

## 2014-06-16 NOTE — Consult Note (Signed)
Psychiatry Consult follow-up  Reason for Consult:  Medication management for dementia with agitation and AMS Referring Physician:  Dr. Verlon Au  Patient Identification: [name] [name] MRN:  [id number] Principal Diagnosis: Dementia without behavioral disturbance Diagnosis:   Patient Active Problem List   Diagnosis Date Noted  . S/p nephrectomy [Z90.5] 06/12/2014  . Acute renal failure [N17.9] 06/12/2014  . UTI (lower urinary tract infection) [N39.0] 06/12/2014  . Metabolic encephalopathy [V56.43] 06/12/2014  . Elevated troponin [R79.89] 06/12/2014  . Altered mental state [R41.82] 06/12/2014  . Altered mental status [R41.82]   . Transitional cell carcinoma of kidney [C64.9] 06/05/2014  . TIA (transient ischemic attack) [G45.9] 01/23/2014  . Dementia without behavioral disturbance [F03.90] 01/23/2014  . Prolonged Q-T interval on ECG [I45.81] 01/23/2014  . DM type 2 (diabetes mellitus, type 2) [E11.9] 01/23/2014  . Anemia, post op [D64.9] 01/13/2014  . CAD, multiple vessel [I25.10] 12/18/2013  . S/P CABG x 4 [Z95.1] 12/18/2013  . ST elevation myocardial infarction (STEMI) of inferior wall, initial episode of care [I21.19] 12/17/2013  . Hypoxemia [R09.02] 11/19/2013  . Orthostatic hypotension [I95.1] 11/19/2013  . Dysphagia, unspecified(787.20) [R13.10] 12/12/2012  . Dizziness [R42] 11/07/2012  . Hyperlipidemia [E78.5] 11/07/2012  . Obstructive sleep apnea [G47.33] 11/07/2012    Total Time spent with patient: 20 minutes  Subjective:   [name] [name] is a 81 y.o. male patient admitted with AMS.  HPI: [name] [name] is a 81 years old male seen today and chart reviewed. Case discussed with the patient youngest son and daughter-in-law who were at bedside and also Dr. Verlon Au.  Patient was not able to contribute to this evaluation secondary to being sedated with medication for increased agitation and aggressive behavior and also required soft restraints. Reportedly patient has been living by  himself and has been assisted by family and also help hired for 24 hours. Patient has been deteriorated since he has been suffering with multiple medical problems including heart attack, CABG, bladder cancer, kidney surgery and recently become psychotic with delusional thinking about his girlfriend has been married someone else, not sleeping well, not eating well and become irritable, agitated and aggressive. Patient has a history of moderate dementia and has been treated with medication from primary care physician over 2 years. Patient has no history of psychiatric treatment. Patient has no history of suicidal or homicidal ideation, intention or plans.  Interval history: Patient seen with the Sindy Messing LCSW for psychiatric consultation follow-up. Patient is awake and alert and oriented to his son and daughter-in-law. Patient is more communicative today than yesterday. Patient has been irritable, frustrated and agitated when asked about his delusional thoughts about girlfriend leaving him. Patient stated that everybody knows about his stresses and problems so he refused to talk about it. Patient has been compliant with his medication and has a better sleep and rest. Patient denies current suicidal or homicidal ideation, intention or plans.  Medical history: 67 ? Mod Dementia, OSA/OHSS on CPAP, STEMI s/p cath 12/17/13 + CAGB, Orthostatic hypotension on both Midodrine + florinef. 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 Louis Meckel 06/05/14 admitted from [facility] 06/12/14 with toxic metabolic encephalopathy in the setting of progressive worsening of renal function. Apparently patient had done well status post surgery and was discharged~2/28 he did  well initially but on 3/2 it was noted  that he had decreased sleep, increased agitation, and more confusion than usual. He has been diagnosed with dementia~2 years ago and had been placed on Aricept-his family confirm his dementia is mild to moderate- he was still able to cook for himself prior to his CABG and does need some assistance with daily but for the most part is pretty independent. Urology was consulted with regards to his recent surgery as this may been a postoperative complication   Past Medical History:  Past Medical History  Diagnosis Date  . Chronic constipation   . GERD (gastroesophageal reflux disease)   . Arthritis   . Hyperlipidemia   . Orthostatic hypotension 11/19/2013  . Coronary artery disease     CARDIOLOGIST-  DR BERRY  --  HX  STEMI  12-17-2013  S/P  CABG X4  12-18-2013  . Mild dementia   . Type 2 diabetes mellitus   . Hypothyroidism   . History of esophageal dilatation   . History of gastritis   . History of hiatal hernia   . History of bladder cancer     hx  TCC low grade  . Prostate cancer     low-grade  . Kidney tumor     right  . BPH (benign prostatic hyperplasia)   . CKD (chronic kidney disease) stage 3, GFR 30-59 ml/min   . History of kidney stones   . ED (erectile dysfunction)   . S/P CABG x 4     12-18-2013  . Bladder tumor   . Complication of anesthesia     HARD TO WAKE  . OSA on CPAP     mild osa   per sleep study 07-11-2009- patient states does not use regularly    Past Surgical History  Procedure Laterality Date  . Upper gastrointestinal endoscopy  04/06/2010  &  01-08-2013    w/ esophageal dilatation  . Hemorrhoid surgery    . Hand surgery      right  . Wrist fracture surgery      pins placed-left  . Cystoscopy with biopsy  01/09/2012    Procedure: CYSTOSCOPY WITH BIOPSY;  Surgeon: Marissa Nestle, MD;  Location: [facility] ORS;  Service: Urology;  Laterality: N/A;  Cystoscopy with Multiple Bladder Biopsies  . Esophagogastroduodenoscopy (egd) with esophageal dilation N/A  01/08/2013    Procedure: ESOPHAGOGASTRODUODENOSCOPY (EGD) WITH ESOPHAGEAL DILATION;  Surgeon: Rogene Houston, MD;  Location: [facility] ENDO SUITE;  Service: Endoscopy;  Laterality: N/A;  120  . Left heart cath N/A 12/17/2013    Procedure: LEFT HEART CATH;  Surgeon: Leonie Man, MD;  Location: [facility] CATH LAB;  Service: Cardiovascular;  Laterality: N/A;  . Left heart catheterization with coronary angiogram N/A 12/17/2013    Procedure: LEFT HEART CATHETERIZATION WITH CORONARY ANGIOGRAM;  Surgeon: Leonie Man, MD;  Location: [facility] CATH LAB;  Service: Cardiovascular;  Laterality: N/A;  . Colonoscopy with esophagogastroduodenoscopy (egd)  08-15-2006    w/ esophageal dilatation  . Umbilical hernia repair  04-24-2003  . Transurethral resection of bladder tumor  06-14-2004  . Transurethral resection of prostate  10-04-2004  . Cysto/  removal bladder calculus/  resection bladder tumor  10-16-2006  . Cystostomy w/ bladder biopsy  01-09-2012  . Cardiac catheterization  12-17-2013   dr Shanon Brow harding    severe disease RCA with thrombotic appearing subtotal occlusions/  95-99%  OM2/  diffuse disease LAD  and D1/  mild to moderate basal to mid inferior  hypokinesis/  severe elevated LVEDP/  ef 45-50%  . Coronary artery bypass graft N/A 12/18/2013    Procedure: Coronary artery bypass grafting times four using left internal mammary artery and endoscopically harvested right saphenous vein graft;  Surgeon: Gaye Pollack, MD;  Location: [facility] OR;  Service: Open Heart Surgery;  Laterality: N/A;  . Transthoracic echocardiogram  11-14-2012    grade I diastolic dysfunction/  ef 55-60%/  trivial MR  and TR  . Cardiovascular stress test  08-09-2009  dr berry    mild to moderate perfusion defect in basal , mid, and apical inferior regions consistent with an infart/scar/   No evidence ischemia/  ef 51%/  no ECG changes/   low risk scan  . Transurethral resection of bladder tumor N/A 04/07/2014    Procedure: TRANSURETHRAL RESECTION OF  BLADDER TUMOR (TURBT);  Surgeon: [name] Persons, MD;  Location: [facility];  Service: Urology;  Laterality: N/A;  . Cystoscopy/retrograde/ureteroscopy Right 04/07/2014    Procedure: CYSTOSCOPY/RETROGRADE/URETEROSCOPY WITH BIOPSY OF RENAL PELVIS TUMOR;  Surgeon: [name] Persons, MD;  Location: [facility];  Service: Urology;  Laterality: Right;  . Robot assisted laparoscopic nephrectomy Right 06/05/2014    Procedure: ROBOTIC ASSISTED LAPAROSCOPIC RIGHT NEPHROURETERECTOMY, VENTRAL HERNIA REPAIR ;  Surgeon: Ardis Hughs, MD;  Location: [facility] ORS;  Service: Urology;  Laterality: Right;  . Cystoscopy w/ ureteral stent placement Right 06/05/2014    Procedure: CYSTOSCOPY ;  Surgeon: Ardis Hughs, MD;  Location: [facility] ORS;  Service: Urology;  Laterality: Right;   Family History:  Family History  Problem Relation Age of Onset  . Cancer - Lung Brother    Social History:  History  Alcohol Use No     History  Drug Use No    History   Social History  . Marital Status: Divorced    Spouse Name: N/A  . Number of Children: 3  . Years of Education: HS   Occupational History  .     Social History Main Topics  . Smoking status: Former Smoker -- 1.00 packs/day for 4 years    Types: Cigarettes  . Smokeless tobacco: Former Systems developer    Types: [place] date: 01/03/1960  . Alcohol Use: No  . Drug Use: No  . Sexual Activity: Yes    Birth Control/ Protection: None   Other Topics Concern  . None   Social History Narrative   Patient is single and lives alone.   Patient is retired.   Patient has a high school education.   Patient is right-handed.   Patient drinks one cup of coffee and one cup of soda or tea daily.   Patient has three adult children.    Allergies:   Allergies  Allergen Reactions  . Statins Other (See Comments)    MUSCLE ACHES    Vitals: Blood pressure 161/76, pulse 80, temperature 98.4 F (36.9 C), temperature source Oral, resp. rate 20,  height 5\' 10"  (1.778 m), weight 101 kg (222 lb 10.6 oz), SpO2 99 %.  Risk to Self: Is patient at risk for suicide?: No Risk to Others:   Prior Inpatient Therapy:   Prior Outpatient Therapy:    Current Facility-Administered Medications  Medication Dose Route Frequency Provider Last Rate Last Dose  . aspirin EC tablet 325 mg  325 mg Oral Daily Phillips Grout, MD   325 mg at 06/16/14 1045  . cefTRIAXone (ROCEPHIN) 1 g in dextrose 5 % 50 mL IVPB -  Premix  1 g Intravenous Q24H Phillips Grout, MD   1 g at 06/15/14 2110  . donepezil (ARICEPT) tablet 10 mg  10 mg Oral QHS Nita Sells, MD   10 mg at 06/15/14 2315  . escitalopram (LEXAPRO) tablet 20 mg  20 mg Oral Daily Nita Sells, MD   20 mg at 06/16/14 1045  . hyoscyamine (LEVSIN SL) SL tablet 0.125 mg  0.125 mg Sublingual Q6H PRN Irine Seal, MD   0.125 mg at 06/15/14 1506  . levothyroxine (SYNTHROID, LEVOTHROID) tablet 150 mcg  150 mcg Oral QAC breakfast Nita Sells, MD   150 mcg at 06/16/14 1044  . LORazepam (ATIVAN) injection 1-2 mg  1-2 mg Intravenous Q4H PRN Nita Sells, MD   2 mg at 06/15/14 1456  . OLANZapine zydis (ZYPREXA) disintegrating tablet 5 mg  5 mg Oral BID Nita Sells, MD   5 mg at 06/16/14 1045  . ondansetron (ZOFRAN) tablet 4 mg  4 mg Oral Q6H PRN Phillips Grout, MD       Or  . ondansetron (ZOFRAN) injection 4 mg  4 mg Intravenous Q6H PRN Phillips Grout, MD      . pantoprazole (PROTONIX) EC tablet 20 mg  20 mg Oral q morning - 10a Nita Sells, MD   20 mg at 06/16/14 1045  . sodium chloride 0.9 % injection 3 mL  3 mL Intravenous Q12H Phillips Grout, MD   3 mL at 06/16/14 1045    Musculoskeletal: Strength & Muscle Tone: decreased Gait & Station: unable to stand Patient leans: N/A  Psychiatric Specialty Exam: Physical Exam as per history and physical   ROS agitation, aggression, cognitive deficits   Blood pressure 161/76, pulse 80, temperature 98.4 F (36.9 C), temperature  source Oral, resp. rate 20, height 5\' 10"  (1.778 m), weight 101 kg (222 lb 10.6 oz), SpO2 99 %.Body mass index is 31.95 kg/(m^2).  General Appearance: Disheveled and Guarded  Eye Contact::  Minimal  Speech:  Slow and Slurred  Volume:  Decreased  Mood:  Irritable  Affect:  Constricted  Thought Process:  Disorganized, Irrelevant and Loose  Orientation:  Negative  Thought Content:  Delusions and Rumination  Suicidal Thoughts:  No  Homicidal Thoughts:  No  Memory:  Immediate;   Poor Recent;   Poor  Judgement:  Impaired  Insight:  Lacking  Psychomotor Activity:  Restlessness  Concentration:  Poor  Recall:  Poor  Fund of Knowledge:NA  Language: Poor  Akathisia:  Negative  Handed:  Right  AIMS (if indicated):     Assets:  Financial Resources/Insurance Housing Intimacy Leisure Time Social Support  ADL's:  Impaired  Cognition: Impaired,  Moderate  Sleep:      Medical Decision Making: New problem, with additional work up planned, Review of Psycho-Social Stressors (1), Review or order clinical lab tests (1), Discuss test with performing physician (1), Established Problem, Worsening (2), Review or order medicine tests (1), Review of Medication Regimen & Side Effects (2) and Review of New Medication or Change in Dosage (2)  Treatment Plan Summary: Daily contact with patient to assess and evaluate symptoms and progress in treatment and Medication management  Plan:  Continue Zyprexa Zydis 5 mg twice daily for agitation and poor appetite Recommend psychiatric Inpatient admission when medically cleared. Supportive therapy provided about ongoing stressors.  Appreciate psychiatric consultation and follow up as clinically required Please contact [phone] or [phone] if needs further assistance  Disposition: Patient may benefit from geriatric psychiatry  placement when medically stable.  Mennie Spiller,JANARDHAHA R. 06/16/2014 12:41 PM

## 2014-06-16 NOTE — Progress Notes (Signed)
ID: [id number] POD#1 from robotic assisted right nephro-ureterectomy, readmitted with encephalopathy.  Intv: Patient is alert and disoriented today.  He is somnolent but easily arousable.    PE: AFVSS  Intake/Output Summary (Last 24 hours) at 06/16/14 0949 Last data filed at 06/16/14 0902  Gross per 24 hour  Intake    490 ml  Output   1650 ml  Net  -1160 ml  abdomen is soft, incisions are intact without surrounding erythema Foley is draining clear yellow urine.   Recent Labs  06/14/14 1105 06/15/14 0550  WBC 10.5 10.3  HGB 10.4* 9.8*  HCT 32.2* 31.0*    Recent Labs  06/14/14 1105 06/15/14 0550  NA 140 143  K 3.4* 3.7  CL 108 109  CO2 27 28  GLUCOSE 137* 82  BUN 18 19  CREATININE 1.71* 1.82*  CALCIUM 8.6 8.6   No results for input(s): LABPT, INR in the last 72 hours. No results for input(s): LABURIN in the last 72 hours. Results for orders placed or performed during the hospital encounter of 06/12/14  Urine culture     Status: None   Collection Time: 06/12/14  6:45 PM  Result Value Ref Range Status   Specimen Description URINE, CATHETERIZED  Final   Special Requests NONE  Final   Colony Count NO GROWTH Performed at [facility]   Final   Culture NO GROWTH Performed at [facility]   Final   Report Status 06/15/2014 FINAL  Final  Blood culture (routine x 2)     Status: None (Preliminary result)   Collection Time: 06/12/14  9:25 PM  Result Value Ref Range Status   Specimen Description BLOOD LEFT ARM  Final   Special Requests BOTTLES DRAWN AEROBIC AND ANAEROBIC 6CC  Final   Culture NO GROWTH 3 DAYS  Final   Report Status PENDING  Incomplete  Blood culture (routine x 2)     Status: None (Preliminary result)   Collection Time: 06/12/14  9:30 PM  Result Value Ref Range Status   Specimen Description BLOOD LEFT HAND  Final   Special Requests BOTTLES DRAWN AEROBIC AND ANAEROBIC 6CC  Final   Culture NO GROWTH 3 DAYS  Final   Report Status PENDING   Incomplete  Urine culture     Status: None   Collection Time: 06/13/14  9:50 AM  Result Value Ref Range Status   Specimen Description URINE, CATHETERIZED  Final   Special Requests NONE  Final   Colony Count NO GROWTH Performed at [facility]   Final   Culture NO GROWTH Performed at [facility]   Final   Report Status 06/14/2014 FINAL  Final     Imp: s/p R RAL nephro-ureterectomy, readmitted with encephalopathy. Rec: Again, the patient from a surgical standpoint appears to look okay.  His encephalopathy does not appear to be related to his surgical procedure.  He is more lucid today, hopefully we can take him out of 4 point restraints and allow him some mobility.  It would be great to get the cystogram today if possible.  Otherwise, we will try again tomorrow.  I don't think it is worth giving him anesthesia and causing more altered mental status to get his Foley catheter out.

## 2014-06-16 NOTE — Progress Notes (Signed)
Clinical Social Work  CSW rounded with psych MD in order to evaluate patient. Patient laying in bed in restraints when CSW arrived. Patient's son and dtr-in-law at bedside and reports patient remains confused. Patient was able to engage in some conversation but guarded and unable to provide any meaningful information. Family believes that patient is agitated due to belief that his girlfriend has married another man or that he is fearful of dying. Family reports they will continue to follow in order to try and determine why patient is upset. Psych MD to evaluate medications and reports possible geri psych hospitalization needed once stable. CSW will continue to follow and will assist with DC planning once patient is medically stable.  [place], [place] [phone]

## 2014-06-16 NOTE — Progress Notes (Signed)
[name] [name] JSE:[id number] DOB: 1933/07/16 DOA: 06/12/2014 PCP: Purvis Kilts, MD  Brief narrative: 81 ? Mod Dementia, OSA/OHSS on CPAP, STEMI s/p cath 12/17/13 + CAGB, Orthostatic hypotension on both Midodrine + florinef. 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 Louis Meckel 06/05/14 admitted from [facility] 06/12/14 with toxic metabolic encephalopathy in the setting of progressive worsening of renal function Apparently patient had done well status post surgery and was discharged~2/28 he did well initially but on 3/2 it was noted that he had decreased sleep, increased agitation, and more confusion than usual. He has been diagnosed with dementia~2 years ago and had been placed on Aricept-his family confirm his dementia is mild to moderate- he was still able to cook for himself prior to his CABG and does need some assistance with daily but for the most part is pretty independent Urology was consulted with regards to his recent surgery as this may been a postoperative complication. Was found to have Acute toxic metabolic encephalopathy and psychiatry as well as Neurology were consulted So far no known cause  Past medical history-As per Problem list Chart reviewed as below-   Consultants:  Urology  Procedures:  Ultrasound renal 3/5  Antibiotics:  Rocephin 3/4-->3/7 as urine culture showed no growth   Subjective   Agitation continues next line in 4. restraints Redirectable to some extent with coaxing and holding his hand, Becomes agitated as soon as he is left alone Does not know the president is, does not know the year, does not know the date, cannot reliably identify anything but himself and his date of birth Review of systems is completely unreliable   Objective    Interim History:   Telemetry: QTc 0.51   Objective: Filed Vitals:   06/15/14 0544 06/15/14 1425 06/15/14 2019 06/16/14 0451  BP: 137/76 152/80 156/64 161/76  Pulse: 70 85 76 80  Temp: 97.6 F (36.4 C) 97.4 F (36.3 C) 98.5 F (36.9 C) 98.4 F (36.9 C)  TempSrc: Oral Oral Oral Oral  Resp: 20 20 20 20   Height:      Weight:    101 kg (222 lb 10.6 oz)  SpO2: 100% 100% 100% 99%    Intake/Output Summary (Last 24 hours) at 06/16/14 0928 Last data filed at 06/16/14 0902  Gross per 24 hour  Intake    490 ml  Output   1650 ml  Net  -1160 ml    Exam:  General:  Confused but redirectable to some excitable Cardiovascular:  S1-S2 no murmur rub or gallop Respiratory:  Clinically clear no added sound Abdomen:  Multiple abdominal scars however no rebound or guarding SNo lower extremity edema NConfused but moving 4 limbs purposefully on command  Data Reviewed: Basic Metabolic Panel:  Recent Labs Lab 06/12/14 1854 06/13/14 0650 06/14/14 1105 06/15/14 0550  NA 137 138 140 143  K 3.2* 3.6 3.4* 3.7  CL 102 104 108 109  CO2 26 30 27 28   GLUCOSE 96 93 137* 82  BUN 26* 24* 18 19  CREATININE 2.15* 2.07* 1.71* 1.82*  CALCIUM 9.0 8.4 8.6 8.6   Liver Function Tests:  Recent Labs Lab 06/13/14 0650 06/14/14 1105 06/15/14 0550  AST 35 38* 35  ALT 16 18 18   ALKPHOS 53 49  51  BILITOT 0.7 0.5 0.5  PROT 6.2 5.9* 5.6*  ALBUMIN 3.2* 3.1* 2.7*   No results for input(s): LIPASE, AMYLASE in the last 168 hours.  Recent Labs Lab 06/13/14 1150  AMMONIA 12   CBC:  Recent Labs Lab 06/12/14 1854 06/14/14 1105 06/15/14 0550  WBC 11.1* 10.5 10.3  NEUTROABS 7.6 9.4* 7.0  HGB 10.4* 10.4* 9.8*  HCT 32.6* 32.2* 31.0*  MCV 87.9 88.5 90.1  PLT 260 290 261   Cardiac Enzymes:  Recent Labs Lab 06/12/14 1854 06/12/14 2125 06/13/14 0039 06/13/14 0650 06/13/14 1154  TROPONINI 0.31* 0.27* 0.25* 0.23* 0.20*   BNP: Invalid input(s): POCBNP CBG:  Recent  Labs Lab 06/13/14 0051 06/13/14 0739  GLUCAP 93 90    Recent Results (from the past 240 hour(s))  Urine culture     Status: None   Collection Time: 06/12/14  6:45 PM  Result Value Ref Range Status   Specimen Description URINE, CATHETERIZED  Final   Special Requests NONE  Final   Colony Count NO GROWTH Performed at [facility]   Final   Culture NO GROWTH Performed at [facility]   Final   Report Status 06/15/2014 FINAL  Final  Blood culture (routine x 2)     Status: None (Preliminary result)   Collection Time: 06/12/14  9:25 PM  Result Value Ref Range Status   Specimen Description BLOOD LEFT ARM  Final   Special Requests BOTTLES DRAWN AEROBIC AND ANAEROBIC 6CC  Final   Culture NO GROWTH 3 DAYS  Final   Report Status PENDING  Incomplete  Blood culture (routine x 2)     Status: None (Preliminary result)   Collection Time: 06/12/14  9:30 PM  Result Value Ref Range Status   Specimen Description BLOOD LEFT HAND  Final   Special Requests BOTTLES DRAWN AEROBIC AND ANAEROBIC 6CC  Final   Culture NO GROWTH 3 DAYS  Final   Report Status PENDING  Incomplete  Urine culture     Status: None   Collection Time: 06/13/14  9:50 AM  Result Value Ref Range Status   Specimen Description URINE, CATHETERIZED  Final   Special Requests NONE  Final   Colony Count NO GROWTH Performed at [facility]   Final   Culture NO GROWTH Performed at [facility]   Final   Report Status 06/14/2014 FINAL  Final     Studies:              All Imaging reviewed and is as per above notation   Scheduled Meds: . aspirin EC  325 mg Oral Daily  . cefTRIAXone (ROCEPHIN)  IV  1 g Intravenous Q24H  . donepezil  10 mg Oral QHS  . escitalopram  20 mg Oral Daily  . levothyroxine  150 mcg Oral QAC breakfast  . OLANZapine zydis  5 mg Oral BID  . pantoprazole  20 mg Oral q morning - 10a  . sodium chloride  3 mL Intravenous Q12H   Continuous Infusions:      Assessment/Plan:  1. Toxic metabolic encephalopathy with delirium-potentially secondary to hypoxia as well as [pain meds, Aricept, Vesicare, Lexapro] in a setting of rising creatinine status post robotic right laparoscopic nephroureterectomy-urology's opinion is that rising creatinine is expected and urine analysis would be expected to contain TNTC WBC--Rocephin 06/12/14-->06/16/14 as urine cultures negative. Ammonia 12, TSH elevated as below.  His family tells me prior to discharge from surgery he was having "hallucinations"-- more delusional  than anything else-appreciate psychiatrist -Dr. Louis Meckel of urology and I had a long talk-his TCC was non-metastatic.  Neurology opinion confirms probable metabolic encephalopathy >neurological/organic component and would not image brain under sedation as would worsen delirium. Complicated case. Do not expect this to resolve quickly 2. ? Antibiotics discontinued 3/8 3. Demand ischemia-troponin trending downward EKG on admission benign-no further workup required 4. Recent CABG 12/17/13 -cannot give beta blocker as history orthostasis, ACE inhibitor relatively contraindicated given renal failure . Continue aspirin 325 daily. D/c statin 5. Elevated TSH-unclear how to interpret in setting of recent surgery. Nonetheless thyroxine dosage adjusted 3/5. 6. Polypharmacy-some evidence to suggest anticholinesterase inhibitors can worsen delirium such as Aricept-after my discussion with Dr. Earley Brooke was recommended to restart his Lexapro 20 and Aricept 10 on 3/6. 7. Acute renal insufficiency with mild hypokalemia 3.2 on admission Estimated Creatinine Clearance: 39.8 mL/min (by C-G formula based on Cr of 1.82).-continue saline75 cc per hour with 20 of K repeat labs in a.m. GFR is stable currently 8. History of orthostatic hypotension-Midodrin/Florinef on hold for now  9. Reported history of adrenal rapid wean of Solu-Cortef from 50 every 8 to 50 every 12 and then 50  daily--steroids discontinued completely 3/7-see #1 10. OHSS/unable to tolerate CPAP at night secondary to confusion  Code Status: Full--this will need to be addressed in the oncoming days. If his confusion persists and if he is unable to mentally clear I would suspect that this is his new normal. In that case palliative consult would be very appropriate and I will discuss with family today regarding their wishes. Family Communication: No family at bedside currently Disposition Plan: Inpatient  25 minutes   Verneita Griffes, MD  [facility] Pager [phone] 06/16/2014, 9:28 AM    LOS: 4 days

## 2014-06-17 DIAGNOSIS — C641 Malignant neoplasm of right kidney, except renal pelvis: Secondary | ICD-10-CM

## 2014-06-17 DIAGNOSIS — N289 Disorder of kidney and ureter, unspecified: Secondary | ICD-10-CM

## 2014-06-17 DIAGNOSIS — I214 Non-ST elevation (NSTEMI) myocardial infarction: Secondary | ICD-10-CM

## 2014-06-17 LAB — FOLATE: Folate: 18.8 ng/mL

## 2014-06-17 LAB — CULTURE, BLOOD (ROUTINE X 2)
Culture: NO GROWTH
Culture: NO GROWTH

## 2014-06-17 LAB — IRON AND TIBC
IRON: 18 ug/dL — AB (ref 42–165)
SATURATION RATIOS: 9 % — AB (ref 20–55)
TIBC: 205 ug/dL — ABNORMAL LOW (ref 215–435)
UIBC: 187 ug/dL (ref 125–400)

## 2014-06-17 LAB — VITAMIN B12: Vitamin B-12: 867 pg/mL (ref 211–911)

## 2014-06-17 LAB — FERRITIN: Ferritin: 197 ng/mL (ref 22–322)

## 2014-06-17 LAB — RETICULOCYTES
RBC.: 3.63 MIL/uL — ABNORMAL LOW (ref 4.22–5.81)
Retic Count, Absolute: 36.3 10*3/uL (ref 19.0–186.0)
Retic Ct Pct: 1 % (ref 0.4–3.1)

## 2014-06-17 MED ORDER — BELLADONNA ALKALOIDS-OPIUM 16.2-60 MG RE SUPP
1.0000 | Freq: Four times a day (QID) | RECTAL | Status: DC | PRN
  Administered 2014-06-17 – 2014-06-18 (×3): 1 via RECTAL
  Filled 2014-06-17 (×2): qty 1

## 2014-06-17 MED ORDER — SODIUM CHLORIDE 0.9 % IV SOLN
INTRAVENOUS | Status: DC
  Administered 2014-06-17 – 2014-06-19 (×3): via INTRAVENOUS

## 2014-06-17 NOTE — Progress Notes (Signed)
[facility]                                                                              Patient Demographics  [name] [name], is a 81 y.o. male, DOB - 1934/04/04, VOJ:[id number]  Admit date - 06/12/2014   Admitting Physician Theressa Millard, MD  Outpatient Primary MD for the patient is Purvis Kilts, MD  LOS - 5   Chief Complaint  Patient presents with  . Altered Mental Status      HPI on 06/12/2014 by Dr. Derrill Kay 81 yo male h/o mild to mod dementia, s/p cabg end of 2015, s/p nephrectomy for TCC last week at [facility] long, ckd comes in with family for confusion and agitation today. Was d/c from [place] about 4 days ago. Has not really been eating or drinking well. Not been complaining of any pain but has not been sleeping well either. No fevers. No n/v/d. Has foley cath in since surgery, family thought there may be something wrong with it so they changed it today, and has been having good uop. He has fallen several times since hes been home and is generally getting weaker. He was on abx perioperatively but not sent home on any as his cultures were all negative. He had most recent urine culture this past Monday which family reports was negative. No focal neurological deficits. No swelling in his legs. Still has staples to surgical sites on abdomen which have been well healing, no discharge, no other rashes. Asked to admit for his delirium/encephalopathy. No coughing. No sob.  Assessment & Plan  Toxic Metabolic Encephalopathy with Delirium -Likely complicated by polypharmacy versus hypoxia -Patient still requiring 4 point restraints -Status post right laparoscopic nephroureterectomy -Ammonia 12, TSH 13.484 -Urine cultures were negative, blood cultures negative -Psychiatry consulted -Currently on ceftriaxone  Demand ischemia with elevated troponin -Troponin trending downward, likely secondary to the above, no further workup  History of CABG -On  12/17/2013 -Currently not on beta blocker due to orthostasis, no ACE inhibitor due to renal failure -Continue aspirin 325 mg daily  Hypothyroidism -TSH 13.484, continue synthroid  Polypharmacy -Psychiatry consulted  Acute renal insufficiency with mild hypokalemia -Upon admission, creatinine was 2.15 -Baseline 1.2, currently 1.82 -Will continue gentle IVF -Continue to monitor BMP  Orthostatic hypotension -Midodrine and Florinef held- due to soft pressures  Obstructive sleep apnea -Unable to tolerate C-peptide night due to confusion  Transitional cell carcinoma -S/p laparoscopic nephroureterectomy -Urology following appreciated -Possible plan for cystogram  Normocytic Anemia -Will continue to monitor CBC  Code Status: Full  Family Communication: None at bedside  Disposition Plan: Admitted.  Requiring restraints, continues to be confused.  Time Spent in minutes   30 minutes  Procedures  Renal US  Consults   Urology Psychiatry Neurology  DVT Prophylaxis  SCDs  Lab Results  Component Value Date   PLT 261 06/15/2014    Medications  Scheduled Meds: . aspirin EC  325 mg Oral Daily  . cefTRIAXone (ROCEPHIN)  IV  1 g Intravenous Q24H  . donepezil  10 mg Oral QHS  . escitalopram  20 mg Oral Daily  . levothyroxine  150 mcg Oral QAC breakfast  . OLANZapine  zydis  5 mg Oral BID  . pantoprazole  20 mg Oral q morning - 10a  . sodium chloride  3 mL Intravenous Q12H   Continuous Infusions:  PRN Meds:.hyoscyamine, LORazepam, ondansetron **OR** ondansetron (ZOFRAN) IV  Antibiotics    Anti-infectives    Start     Dose/Rate Route Frequency Ordered Stop   06/13/14 2000  cefTRIAXone (ROCEPHIN) 1 g in dextrose 5 % 50 mL IVPB - Premix     1 g 100 mL/hr over 30 Minutes Intravenous Every 24 hours 06/12/14 2354     06/12/14 2115  cefTRIAXone (ROCEPHIN) 1 g in dextrose 5 % 50 mL IVPB     1 g 100 mL/hr over 30 Minutes Intravenous  Once 06/12/14 2114 06/12/14 2207         Subjective:   [name] [name] seen and examined today.  Patient currently has no complaints. Denies pain.  Not very talkative.  Asleep but arousable.  Objective:   Filed Vitals:   06/16/14 1300 06/16/14 2144 06/17/14 0539 06/17/14 1320  BP: 154/73 152/86 143/91 91/67  Pulse: 84 71 70 63  Temp: 97.6 F (36.4 C) 98.6 F (37 C) 97.7 F (36.5 C) 98 F (36.7 C)  TempSrc: Axillary Axillary Axillary Oral  Resp: 22 21 19 18   Height:      Weight:   97.6 kg (215 lb 2.7 oz)   SpO2: 97% 99% 97% 100%    Wt Readings from Last 3 Encounters:  06/17/14 97.6 kg (215 lb 2.7 oz)  06/05/14 107.049 kg (236 lb)  04/22/14 106.142 kg (234 lb)     Intake/Output Summary (Last 24 hours) at 06/17/14 1539 Last data filed at 06/17/14 1500  Gross per 24 hour  Intake    410 ml  Output   1250 ml  Net   -840 ml    Exam  General: Well developed, well nourished  HEENT: NCAT, mucous membranes moist.   Cardiovascular: S1 S2 auscultated, no rubs, murmurs or gallops. Regular rate and rhythm.  Respiratory: Clear to auscultation bilaterally with equal chest rise  Abdomen: Soft, nontender, nondistended, + bowel sounds  Extremities: warm dry without cyanosis clubbing or edema  Neuro: Awake, alert, oriented to person.  Currently in restraints.    Data Review   Micro Results Recent Results (from the past 240 hour(s))  Urine culture     Status: None   Collection Time: 06/12/14  6:45 PM  Result Value Ref Range Status   Specimen Description URINE, CATHETERIZED  Final   Special Requests NONE  Final   Colony Count NO GROWTH Performed at [facility]   Final   Culture NO GROWTH Performed at [facility]   Final   Report Status 06/15/2014 FINAL  Final  Blood culture (routine x 2)     Status: None   Collection Time: 06/12/14  9:25 PM  Result Value Ref Range Status   Specimen Description BLOOD LEFT ARM  Final   Special Requests BOTTLES DRAWN AEROBIC AND ANAEROBIC 6CC  Final    Culture NO GROWTH 5 DAYS  Final   Report Status 06/17/2014 FINAL  Final  Blood culture (routine x 2)     Status: None   Collection Time: 06/12/14  9:30 PM  Result Value Ref Range Status   Specimen Description BLOOD LEFT HAND  Final   Special Requests BOTTLES DRAWN AEROBIC AND ANAEROBIC 6CC  Final   Culture NO GROWTH 5 DAYS  Final   Report Status 06/17/2014 FINAL  Final  Urine culture     Status: None   Collection Time: 06/13/14  9:50 AM  Result Value Ref Range Status   Specimen Description URINE, CATHETERIZED  Final   Special Requests NONE  Final   Colony Count NO GROWTH Performed at [facility]   Final   Culture NO GROWTH Performed at [facility]   Final   Report Status 06/14/2014 FINAL  Final    Radiology Reports Dg Chest 2 View  06/12/2014   CLINICAL DATA:  Acute onset of altered mental status. Initial encounter.  EXAM: CHEST  2 VIEW  COMPARISON:  Chest radiograph performed 01/28/2014  FINDINGS: The lungs are well-aerated. Mild peribronchial thickening is noted. There is no evidence of focal opacification, pleural effusion or pneumothorax.  The heart is borderline normal in size. The patient is status post median sternotomy. No acute osseous abnormalities are seen.  IMPRESSION: Mild peribronchial thickening noted; lungs otherwise clear.   Electronically Signed   By: Garald Balding M.D.   On: 06/12/2014 20:41   Ct Head Wo Contrast  06/12/2014   CLINICAL DATA:  Confusion. Hallucination. Recent nephrectomy. Bladder cancer. Mild dementia.  EXAM: CT HEAD WITHOUT CONTRAST  TECHNIQUE: Contiguous axial images were obtained from the base of the skull through the vertex without intravenous contrast.  COMPARISON:  01/23/2014  FINDINGS: Sinuses/Soft tissues: Motion degradation. Mucosal thickening of bilateral maxillary sinuses. Fluid in the right frontal sinus. Clear mastoid air cells.  Intracranial: Mild low density in the periventricular white matter likely related to small  vessel disease. Cerebral volume loss which is normal for age. No mass lesion, hemorrhage, hydrocephalus, acute infarct, intra-axial, or extra-axial fluid collection.  IMPRESSION: 1.  No acute intracranial abnormality. 2. Sinus disease. 3. Mild small vessel ischemic change.   Electronically Signed   By: Abigail Miyamoto M.D.   On: 06/12/2014 20:27   US Renal  06/13/2014   CLINICAL DATA:  Elevated BUN/creatinine.  EXAM: RENAL/URINARY TRACT ULTRASOUND COMPLETE  COMPARISON:  CT 03/13/2014  FINDINGS: Right Kidney:  Surgically absent  Left Kidney: 12.0 cm. Normal renal cortical thickness for age. No hydronephrosis. Left renal collecting system stone or stones. Most well-defined measures 5 mm in the inter/lower pole region.  Bladder: Per clinical history, the patient has a Foley catheter. It is not visualized in the bladder, and the bladder is not decompressed.  IMPRESSION: 1. Status post right nephrectomy. 2. No evidence of left-sided hydronephrosis. At least 1 left renal collecting system calculus identified. 3. Foley catheter not identified within the urinary bladder. Bladder is not decompressed. Correlate with position and function of Foley catheter. These results will be called to the ordering clinician or representative by the Radiologist Assistant, and communication documented in the PACS or zVision Dashboard.   Electronically Signed   By: Abigail Miyamoto M.D.   On: 06/13/2014 12:29    CBC  Recent Labs Lab 06/12/14 1854 06/14/14 1105 06/15/14 0550  WBC 11.1* 10.5 10.3  HGB 10.4* 10.4* 9.8*  HCT 32.6* 32.2* 31.0*  PLT 260 290 261  MCV 87.9 88.5 90.1  MCH 28.0 28.6 28.5  MCHC 31.9 32.3 31.6  RDW 15.4 15.6* 16.0*  LYMPHSABS 1.7 0.7 1.8  MONOABS 0.9 0.4 0.8  EOSABS 0.8* 0.0 0.6  BASOSABS 0.1 0.0 0.1    Chemistries   Recent Labs Lab 06/12/14 1854 06/13/14 0650 06/14/14 1105 06/15/14 0550  NA 137 138 140 143  K 3.2* 3.6 3.4* 3.7  CL 102 104 108 109  CO2  26 30 27 28   GLUCOSE 96 93 137* 82   BUN 26* 24* 18 19  CREATININE 2.15* 2.07* 1.71* 1.82*  CALCIUM 9.0 8.4 8.6 8.6  AST  --  35 38* 35  ALT  --  16 18 18   ALKPHOS  --  53 49 51  BILITOT  --  0.7 0.5 0.5   ------------------------------------------------------------------------------------------------------------------ estimated creatinine clearance is 39.2 mL/min (by C-G formula based on Cr of 1.82). ------------------------------------------------------------------------------------------------------------------ No results for input(s): HGBA1C in the last 72 hours. ------------------------------------------------------------------------------------------------------------------ No results for input(s): CHOL, HDL, LDLCALC, TRIG, CHOLHDL, LDLDIRECT in the last 72 hours. ------------------------------------------------------------------------------------------------------------------ No results for input(s): TSH, T4TOTAL, T3FREE, THYROIDAB in the last 72 hours.  Invalid input(s): FREET3 ------------------------------------------------------------------------------------------------------------------  Recent Labs  06/15/14 1610 06/17/14 0800  VITAMINB12 609 867  FOLATE  --  18.8  FERRITIN  --  197  TIBC  --  205*  IRON  --  18*  RETICCTPCT  --  1.0    Coagulation profile No results for input(s): INR, PROTIME in the last 168 hours.  No results for input(s): DDIMER in the last 72 hours.  Cardiac Enzymes  Recent Labs Lab 06/13/14 0039 06/13/14 0650 06/13/14 1154  TROPONINI 0.25* 0.23* 0.20*   ------------------------------------------------------------------------------------------------------------------ Invalid input(s): POCBNP    Trannie Bardales D.O. on 06/17/2014 at 3:39 PM  Between 7am to 7pm - Pager - [phone]  After 7pm go to [email or web address] - password [email or web address]  And look for the night coverage person covering for me after hours  [facility] Office  [phone]

## 2014-06-17 NOTE — Progress Notes (Signed)
   06/17/14 1820  What Happened  Was fall witnessed? Yes  Who witnessed fall? Shataun and family  Patients activity before fall ambulating-assisted;to/from bed, chair, or stretcher  Point of contact other (comment) (knees)  Was patient injured? No  Follow Up  MD notified yes  Time MD notified 63  Family notified Yes-comment  Time family notified 54  Additional tests No  Progress note created (see row info) Yes  Vitals  Temp 97.6 F (36.4 C)  Temp Source Oral  BP (!) 108/45 mmHg  BP Location Right Arm  BP Method Automatic  Patient Position (if appropriate) Lying  Pulse Rate 70  Pulse Rate Source Dinamap  Resp 18  Oxygen Therapy  SpO2 95 %  O2 Device Nasal Cannula  O2 Flow Rate (L/min) 2 L/min

## 2014-06-17 NOTE — Progress Notes (Signed)
Spoke with Pt and caregiver in the room. They stated that he hasn't worn a CPAP previously, and didn't want to wear it tonight.

## 2014-06-17 NOTE — Consult Note (Signed)
Psychiatry Consult follow-up  Reason for Consult:  Medication management for dementia with agitation and AMS Referring Physician:  Dr. Verlon Au  Patient Identification: [name] [name] MRN:  [id number] Principal Diagnosis: Dementia without behavioral disturbance Diagnosis:   Patient Active Problem List   Diagnosis Date Noted  . S/p nephrectomy [Z90.5] 06/12/2014  . Acute renal failure [N17.9] 06/12/2014  . UTI (lower urinary tract infection) [N39.0] 06/12/2014  . Metabolic encephalopathy [B34.19] 06/12/2014  . Elevated troponin [R79.89] 06/12/2014  . Altered mental state [R41.82] 06/12/2014  . Altered mental status [R41.82]   . Transitional cell carcinoma of kidney [C64.9] 06/05/2014  . TIA (transient ischemic attack) [G45.9] 01/23/2014  . Dementia without behavioral disturbance [F03.90] 01/23/2014  . Prolonged Q-T interval on ECG [I45.81] 01/23/2014  . DM type 2 (diabetes mellitus, type 2) [E11.9] 01/23/2014  . Anemia, post op [D64.9] 01/13/2014  . CAD, multiple vessel [I25.10] 12/18/2013  . S/P CABG x 4 [Z95.1] 12/18/2013  . ST elevation myocardial infarction (STEMI) of inferior wall, initial episode of care [I21.19] 12/17/2013  . Hypoxemia [R09.02] 11/19/2013  . Orthostatic hypotension [I95.1] 11/19/2013  . Dysphagia, unspecified(787.20) [R13.10] 12/12/2012  . Dizziness [R42] 11/07/2012  . Hyperlipidemia [E78.5] 11/07/2012  . Obstructive sleep apnea [G47.33] 11/07/2012    Total Time spent with patient: 20 minutes  Subjective:   [name] [name] is a 81 y.o. male patient admitted with AMS.  HPI: [name] [name] is a 81 years old male seen today and chart reviewed. Case discussed with the patient youngest son and daughter-in-law who were at bedside and also Dr. Verlon Au.  Patient was not able to contribute to this evaluation secondary to being sedated with medication for increased agitation and aggressive behavior and also required soft restraints. Reportedly patient has been living by  himself and has been assisted by family and also help hired for 24 hours. Patient has been deteriorated since he has been suffering with multiple medical problems including heart attack, CABG, bladder cancer, kidney surgery and recently become psychotic with delusional thinking about his girlfriend has been married someone else, not sleeping well, not eating well and become irritable, agitated and aggressive. Patient has a history of moderate dementia and has been treated with medication from primary care physician over 2 years. Patient has no history of psychiatric treatment. Patient has no history of suicidal or homicidal ideation, intention or plans.  Interval history: Patient seen with the Sindy Messing LCSW for psych follow-up. Patient has clear sensorium, awake, alert and oriented. Patient  daughter-in-law who is at bedside reported that he was cooperative to take care of him including taking a shower/screening up. Patient has been free from irritable and agitated. He has no delusional thoughts about girlfriend leaving him today and stated that he needs more strength.  Patient has been compliant with his medication and has a better sleep but poor appetite. Patient denies current suicidal or homicidal ideation, intention or plans.  Medical history: 3 ? Mod Dementia, OSA/OHSS on CPAP, STEMI s/p cath 12/17/13 + CAGB, Orthostatic hypotension on both Midodrine + florinef. 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 Louis Meckel 06/05/14 admitted from [facility] 06/12/14 with toxic metabolic encephalopathy in the setting of progressive worsening of renal function. Apparently patient had done well status post surgery and was  discharged~2/28 he did well initially but on 3/2 it was noted that he  had decreased sleep, increased agitation, and more confusion than usual. He has been diagnosed with dementia~2 years ago and had been placed on Aricept-his family confirm his dementia is mild to moderate- he was still able to cook for himself prior to his CABG and does need some assistance with daily but for the most part is pretty independent. Urology was consulted with regards to his recent surgery as this may been a postoperative complication   Past Medical History:  Past Medical History  Diagnosis Date  . Chronic constipation   . GERD (gastroesophageal reflux disease)   . Arthritis   . Hyperlipidemia   . Orthostatic hypotension 11/19/2013  . Coronary artery disease     CARDIOLOGIST-  DR BERRY  --  HX  STEMI  12-17-2013  S/P  CABG X4  12-18-2013  . Mild dementia   . Type 2 diabetes mellitus   . Hypothyroidism   . History of esophageal dilatation   . History of gastritis   . History of hiatal hernia   . History of bladder cancer     hx  TCC low grade  . Prostate cancer     low-grade  . Kidney tumor     right  . BPH (benign prostatic hyperplasia)   . CKD (chronic kidney disease) stage 3, GFR 30-59 ml/min   . History of kidney stones   . ED (erectile dysfunction)   . S/P CABG x 4     12-18-2013  . Bladder tumor   . Complication of anesthesia     HARD TO WAKE  . OSA on CPAP     mild osa   per sleep study 07-11-2009- patient states does not use regularly    Past Surgical History  Procedure Laterality Date  . Upper gastrointestinal endoscopy  04/06/2010  &  01-08-2013    w/ esophageal dilatation  . Hemorrhoid surgery    . Hand surgery      right  . Wrist fracture surgery      pins placed-left  . Cystoscopy with biopsy  01/09/2012    Procedure: CYSTOSCOPY WITH BIOPSY;  Surgeon: Marissa Nestle, MD;  Location: [facility] ORS;  Service: Urology;  Laterality: N/A;  Cystoscopy with Multiple Bladder Biopsies  . Esophagogastroduodenoscopy (egd) with esophageal dilation N/A 01/08/2013     Procedure: ESOPHAGOGASTRODUODENOSCOPY (EGD) WITH ESOPHAGEAL DILATION;  Surgeon: Rogene Houston, MD;  Location: [facility] END[facility] SUITE;  Service: Endoscopy;  Laterality: N/A;  120  . Left heart cath N/A 12/17/2013    Procedure: LEFT HEART CATH;  Surgeon: Leonie Man, MD;  Location: [facility] CATH LAB;  Service: Cardiovascular;  Laterality: N/A;  . Left heart catheterization with coronary angiogram N/A 12/17/2013    Procedure: LEFT HEART CATHETERIZATION WITH CORONARY ANGIOGRAM;  Surgeon: Leonie Man, MD;  Location: [facility] CATH LAB;  Service: Cardiovascular;  Laterality: N/A;  . Colonoscopy with esophagogastroduodenoscopy (egd)  08-15-2006    w/ esophageal dilatation  . Umbilical hernia repair  04-24-2003  . Transurethral resection of bladder tumor  06-14-2004  . Transurethral resection of prostate  10-04-2004  . Cysto/  removal bladder calculus/  resection bladder tumor  10-16-2006  . Cystostomy w/ bladder biopsy  01-09-2012  . Cardiac catheterization  12-17-2013   dr Shanon Brow harding    severe disease RCA with thrombotic appearing subtotal occlusions/  95-99%  OM2/  diffuse disease LAD  and D1/  mild to moderate basal  to mid inferior hypokinesis/  severe elevated LVEDP/  ef 45-50%  . Coronary artery bypass graft N/A 12/18/2013    Procedure: Coronary artery bypass grafting times four using left internal mammary artery and endoscopically harvested right saphenous vein graft;  Surgeon: Gaye Pollack, MD;  Location: [facility] OR;  Service: Open Heart Surgery;  Laterality: N/A;  . Transthoracic echocardiogram  11-14-2012    grade I diastolic dysfunction/  ef 55-60%/  trivial MR  and TR  . Cardiovascular stress test  08-09-2009  dr berry    mild to moderate perfusion defect in basal , mid, and apical inferior regions consistent with an infart/scar/   No evidence ischemia/  ef 51%/  no ECG changes/   low risk scan  . Transurethral resection of bladder tumor N/A 04/07/2014    Procedure: TRANSURETHRAL RESECTION OF BLADDER TUMOR  (TURBT);  Surgeon: Arvil Persons, MD;  Location: [facility];  Service: Urology;  Laterality: N/A;  . Cystoscopy/retrograde/ureteroscopy Right 04/07/2014    Procedure: CYSTOSCOPY/RETROGRADE/URETEROSCOPY WITH BIOPSY OF RENAL PELVIS TUMOR;  Surgeon: Arvil Persons, MD;  Location: [facility];  Service: Urology;  Laterality: Right;  . Robot assisted laparoscopic nephrectomy Right 06/05/2014    Procedure: ROBOTIC ASSISTED LAPAROSCOPIC RIGHT NEPHROURETERECTOMY, VENTRAL HERNIA REPAIR ;  Surgeon: Ardis Hughs, MD;  Location: [facility] ORS;  Service: Urology;  Laterality: Right;  . Cystoscopy w/ ureteral stent placement Right 06/05/2014    Procedure: CYSTOSCOPY ;  Surgeon: Ardis Hughs, MD;  Location: [facility] ORS;  Service: Urology;  Laterality: Right;   Family History:  Family History  Problem Relation Age of Onset  . Cancer - Lung Brother    Social History:  History  Alcohol Use No     History  Drug Use No    History   Social History  . Marital Status: Divorced    Spouse Name: N/A  . Number of Children: 3  . Years of Education: HS   Occupational History  .     Social History Main Topics  . Smoking status: Former Smoker -- 1.00 packs/day for 4 years    Types: Cigarettes  . Smokeless tobacco: Former Systems developer    Types: [place] date: 01/03/1960  . Alcohol Use: No  . Drug Use: No  . Sexual Activity: Yes    Birth Control/ Protection: None   Other Topics Concern  . None   Social History Narrative   Patient is single and lives alone.   Patient is retired.   Patient has a high school education.   Patient is right-handed.   Patient drinks one cup of coffee and one cup of soda or tea daily.   Patient has three adult children.    Allergies:   Allergies  Allergen Reactions  . Statins Other (See Comments)    MUSCLE ACHES    Vitals: Blood pressure 143/91, pulse 70, temperature 97.7 F (36.5 C), temperature source Axillary, resp. rate 19, height 5\' 10"   (1.778 m), weight 97.6 kg (215 lb 2.7 oz), SpO2 97 %.  Risk to Self: Is patient at risk for suicide?: No Risk to Others:   Prior Inpatient Therapy:   Prior Outpatient Therapy:    Current Facility-Administered Medications  Medication Dose Route Frequency Provider Last Rate Last Dose  . aspirin EC tablet 325 mg  325 mg Oral Daily Phillips Grout, MD   325 mg at 06/17/14 1101  . cefTRIAXone (ROCEPHIN) 1 g in dextrose 5 %  50 mL IVPB - Premix  1 g Intravenous Q24H Phillips Grout, MD   1 g at 06/16/14 2125  . donepezil (ARICEPT) tablet 10 mg  10 mg Oral QHS Nita Sells, MD   10 mg at 06/15/14 2315  . escitalopram (LEXAPRO) tablet 20 mg  20 mg Oral Daily Nita Sells, MD   20 mg at 06/17/14 1101  . hyoscyamine (LEVSIN SL) SL tablet 0.125 mg  0.125 mg Sublingual Q6H PRN Irine Seal, MD   0.125 mg at 06/17/14 0432  . levothyroxine (SYNTHROID, LEVOTHROID) tablet 150 mcg  150 mcg Oral QAC breakfast Nita Sells, MD   150 mcg at 06/17/14 1100  . LORazepam (ATIVAN) injection 1-2 mg  1-2 mg Intravenous Q4H PRN Nita Sells, MD   2 mg at 06/16/14 1601  . OLANZapine zydis (ZYPREXA) disintegrating tablet 5 mg  5 mg Oral BID Nita Sells, MD   5 mg at 06/17/14 1101  . ondansetron (ZOFRAN) tablet 4 mg  4 mg Oral Q6H PRN Phillips Grout, MD       Or  . ondansetron (ZOFRAN) injection 4 mg  4 mg Intravenous Q6H PRN Phillips Grout, MD      . pantoprazole (PROTONIX) EC tablet 20 mg  20 mg Oral q morning - 10a Nita Sells, MD   20 mg at 06/17/14 1101  . sodium chloride 0.9 % injection 3 mL  3 mL Intravenous Q12H Phillips Grout, MD   3 mL at 06/16/14 1045    Musculoskeletal: Strength & Muscle Tone: decreased Gait & Station: unable to stand Patient leans: N/A  Psychiatric Specialty Exam: Physical Exam as per history and physical   ROS agitation, aggression, cognitive deficits   Blood pressure 143/91, pulse 70, temperature 97.7 F (36.5 C), temperature source Axillary,  resp. rate 19, height 5\' 10"  (1.778 m), weight 97.6 kg (215 lb 2.7 oz), SpO2 97 %.Body mass index is 30.87 kg/(m^2).  General Appearance: Disheveled and Guarded  Eye Contact::  Minimal  Speech:  Slow and Slurred  Volume:  Decreased  Mood:  Irritable  Affect:  Constricted  Thought Process:  Disorganized, Irrelevant and Loose  Orientation:  Negative  Thought Content:  Delusions and Rumination  Suicidal Thoughts:  No  Homicidal Thoughts:  No  Memory:  Immediate;   Poor Recent;   Poor  Judgement:  Impaired  Insight:  Lacking  Psychomotor Activity:  Restlessness  Concentration:  Poor  Recall:  Poor  Fund of Knowledge:NA  Language: Poor  Akathisia:  Negative  Handed:  Right  AIMS (if indicated):     Assets:  Financial Resources/Insurance Housing Intimacy Leisure Time Social Support  ADL's:  Impaired  Cognition: Impaired,  Moderate  Sleep:      Medical Decision Making: New problem, with additional work up planned, Review of Psycho-Social Stressors (1), Review or order clinical lab tests (1), Discuss test with performing physician (1), Established Problem, Worsening (2), Review or order medicine tests (1), Review of Medication Regimen & Side Effects (2) and Review of New Medication or Change in Dosage (2)  Treatment Plan Summary: Daily contact with patient to assess and evaluate symptoms and progress in treatment and Medication management  Plan:  Continue Zyprexa Zydis 5 mg twice daily Patient does not meet criteria for psychiatric inpatient admission. Supportive therapy provided about ongoing stressors.  Appreciate psychiatric consultation and follow up as clinically required Please contact [phone] or [phone] if needs further assistance  Disposition: Patient may benefit from SNP  placement when medically stable.  Ricardo Schubach,JANARDHAHA R. 06/17/2014 11:30 AM

## 2014-06-17 NOTE — Care Management Note (Signed)
CARE MANAGEMENT NOTE 06/17/2014  Patient:  [name] [name],[name] [name]   Account Number:  [id number]  Date Initiated:  06/17/2014  Documentation initiated by:  DAVIS,RHONDA  Subjective/Objective Assessment:   confusion and requiring restraints/poor po intake/elevated creatine/poor uop     Action/Plan:   home when stable   Anticipated DC Date:  06/20/2014   Anticipated DC Plan:  [place] referral  NA      DC Planning Services  CM consult      [facility] Choice  NA   Choice offered to / List presented to:  NA   DME arranged  NA      DME agency  NA     [place] arranged  NA      [place] agency  NA   Status of service:  In process, will continue to follow Medicare Important Message given?  YES (If response is "NO", the following Medicare IM given date fields will be blank) Date Medicare IM given:  06/17/2014 Medicare IM given by:  DAVIS,RHONDA Date Additional Medicare IM given:   Additional Medicare IM given by:    Discharge Disposition:    Per UR Regulation:  Reviewed for med. necessity/level of care/duration of stay  If discussed at [place] of Stay Meetings, dates discussed:   06/16/2014  06/18/2014    Comments:  ---06/17/2014 1716 by Velva Harman--- [id number]/spoke with Son and dtg-in-law, pt has private duty sitters around the clock sitters at home.  Patient is sitting up in bed out of the restraints, foley cath intact and iv flds going at 50cc/hrs.  Patient was eating full ligs tray w/assistance.  Family is aware that dc may be on [phone] if patient continues to improve.  Still awaiting cystogram to be done. ---06/17/2014 1653 by [facility] DAVIS---

## 2014-06-17 NOTE — Progress Notes (Signed)
Clinical Social Work  CSW rounded with patient with psych MD. Patient laying in bed and out of restraints when medical team arrived. Patient more alert and able to participate in conversation today. Dtr-in-law present and reports that patient has been much more appropriate today and not combative. Patient has done well out of restraints and has been cooperative with staff. Patient reports a lack of appetite but reports he slept well last night and is in good spirits.   CSW staffed case with psych MD who reports if patient remains stable then geri psych no longer needed. CSW will continue to follow to assist as needed.  [place], [place] [phone]

## 2014-06-17 NOTE — Progress Notes (Signed)
Subjective: Patient much improved today.  Sitting up in bed.  No longer requiring restraints.    Objective: Current vital signs: BP 91/67 mmHg  Pulse 63  Temp(Src) 98 F (36.7 C) (Oral)  Resp 18  Ht 5\' 10"  (1.778 m)  Wt 97.6 kg (215 lb 2.7 oz)  BMI 30.87 kg/m2  SpO2 100% Vital signs in last 24 hours: Temp:  [97.7 F (36.5 C)-98.6 F (37 C)] 98 F (36.7 C) (03/09 1320) Pulse Rate:  [63-71] 63 (03/09 1320) Resp:  [18-21] 18 (03/09 1320) BP: (91-152)/(67-91) 91/67 mmHg (03/09 1320) SpO2:  [97 %-100 %] 100 % (03/09 1320) Weight:  [97.6 kg (215 lb 2.7 oz)] 97.6 kg (215 lb 2.7 oz) (03/09 0539)  Intake/Output from previous day: 03/08 0701 - 03/09 0700 In: 133 [P.O.:80; I.V.:3; IV Piggyback:50] Out: 1300 [Urine:1300] Intake/Output this shift: Total I/O In: 240 [P.O.:240] Out: -  Nutritional status: Diet full liquid  Neurologic Exam: Mental Status: Awake and alert.  Follows commands.  Speech fluent and less slurred.  Oriented to name and that in the hospital.  Does not know the name of the hospital.  Knows it is 2016 but reports that it is November.    Cranial Nerves: II: Discs flat bilaterally; Blinks to bilateral confrontation. Pupils equal, round, reactive to light and accommodation III,IV, VI: ptosis not present, extra-ocular motions intact bilaterally V,VII: decrease in the right NLF, facial light touch sensation normal bilaterally VIII: hearing normal bilaterally IX,X: gag reflex present XI: bilateral shoulder shrug XII: unable to test Motor: Right :Upper extremity 5/5Left: Upper extremity 5/5 Lower extremity 5/5Lower extremity 5/5 Tone and bulk:normal tone throughout; no atrophy noted Sensory: Responds to tactile stimuli in all extremities Deep Tendon Reflexes: 2+ and symmetric throughout Plantars: Right:  downgoingLeft: downgoing Cerebellar: Finger to nose intact  Lab Results: Basic Metabolic Panel:  Recent Labs Lab 06/12/14 1854 06/13/14 0650 06/14/14 1105 06/15/14 0550  NA 137 138 140 143  K 3.2* 3.6 3.4* 3.7  CL 102 104 108 109  CO2 26 30 27 28   GLUCOSE 96 93 137* 82  BUN 26* 24* 18 19  CREATININE 2.15* 2.07* 1.71* 1.82*  CALCIUM 9.0 8.4 8.6 8.6    Liver Function Tests:  Recent Labs Lab 06/13/14 0650 06/14/14 1105 06/15/14 0550  AST 35 38* 35  ALT 16 18 18   ALKPHOS 53 49 51  BILITOT 0.7 0.5 0.5  PROT 6.2 5.9* 5.6*  ALBUMIN 3.2* 3.1* 2.7*   No results for input(s): LIPASE, AMYLASE in the last 168 hours.  Recent Labs Lab 06/13/14 1150  AMMONIA 12    CBC:  Recent Labs Lab 06/12/14 1854 06/14/14 1105 06/15/14 0550  WBC 11.1* 10.5 10.3  NEUTROABS 7.6 9.4* 7.0  HGB 10.4* 10.4* 9.8*  HCT 32.6* 32.2* 31.0*  MCV 87.9 88.5 90.1  PLT 260 290 261    Cardiac Enzymes:  Recent Labs Lab 06/12/14 1854 06/12/14 2125 06/13/14 0039 06/13/14 0650 06/13/14 1154  TROPONINI 0.31* 0.27* 0.25* 0.23* 0.20*    Lipid Panel: No results for input(s): CHOL, TRIG, HDL, CHOLHDL, VLDL, LDLCALC in the last 168 hours.  CBG:  Recent Labs Lab 06/13/14 0051 06/13/14 0739  GLUCAP 93 18    Microbiology: Results for orders placed or performed during the hospital encounter of 06/12/14  Urine culture     Status: None   Collection Time: 06/12/14  6:45 PM  Result Value Ref Range Status   Specimen Description URINE, CATHETERIZED  Final   Special Requests NONE  Final   Colony Count NO GROWTH Performed at [facility]   Final   Culture NO GROWTH Performed at [facility]   Final   Report Status 06/15/2014 FINAL  Final  Blood culture (routine x 2)     Status: None   Collection Time: 06/12/14  9:25 PM  Result Value Ref Range Status   Specimen Description BLOOD LEFT ARM  Final   Special Requests BOTTLES DRAWN AEROBIC  AND ANAEROBIC 6CC  Final   Culture NO GROWTH 5 DAYS  Final   Report Status 06/17/2014 FINAL  Final  Blood culture (routine x 2)     Status: None   Collection Time: 06/12/14  9:30 PM  Result Value Ref Range Status   Specimen Description BLOOD LEFT HAND  Final   Special Requests BOTTLES DRAWN AEROBIC AND ANAEROBIC 6CC  Final   Culture NO GROWTH 5 DAYS  Final   Report Status 06/17/2014 FINAL  Final  Urine culture     Status: None   Collection Time: 06/13/14  9:50 AM  Result Value Ref Range Status   Specimen Description URINE, CATHETERIZED  Final   Special Requests NONE  Final   Colony Count NO GROWTH Performed at [facility]   Final   Culture NO GROWTH Performed at [facility]   Final   Report Status 06/14/2014 FINAL  Final    Coagulation Studies: No results for input(s): LABPROT, INR in the last 72 hours.  Imaging: No results found.  Medications:  I have reviewed the patient's current medications. Scheduled: . aspirin EC  325 mg Oral Daily  . cefTRIAXone (ROCEPHIN)  IV  1 g Intravenous Q24H  . donepezil  10 mg Oral QHS  . escitalopram  20 mg Oral Daily  . levothyroxine  150 mcg Oral QAC breakfast  . OLANZapine zydis  5 mg Oral BID  . pantoprazole  20 mg Oral q morning - 10a  . sodium chloride  3 mL Intravenous Q12H    Assessment/Plan: Patient improved today.  No longer requiring restraints.  Better oriented.  Cooperative.    Recommendations: 1.  Will continue to follow with you.     LOS: 5 days   Alexis Goodell, MD [facility] [phone] 06/17/2014  3:26 PM

## 2014-06-18 ENCOUNTER — Inpatient Hospital Stay (HOSPITAL_COMMUNITY): Payer: Medicare Other

## 2014-06-18 DIAGNOSIS — H547 Unspecified visual loss: Secondary | ICD-10-CM | POA: Insufficient documentation

## 2014-06-18 DIAGNOSIS — H540 Blindness, both eyes: Secondary | ICD-10-CM

## 2014-06-18 LAB — BASIC METABOLIC PANEL
Anion gap: 5 (ref 5–15)
BUN: 19 mg/dL (ref 6–23)
CALCIUM: 8.6 mg/dL (ref 8.4–10.5)
CO2: 29 mmol/L (ref 19–32)
Chloride: 106 mmol/L (ref 96–112)
Creatinine, Ser: 1.78 mg/dL — ABNORMAL HIGH (ref 0.50–1.35)
GFR calc Af Amer: 40 mL/min — ABNORMAL LOW (ref >90)
GFR calc non Af Amer: 35 mL/min — ABNORMAL LOW (ref >90)
GLUCOSE: 110 mg/dL — AB (ref 70–99)
Potassium: 3.3 mmol/L — ABNORMAL LOW (ref 3.5–5.1)
Sodium: 140 mmol/L (ref 135–145)

## 2014-06-18 LAB — CBC
HEMATOCRIT: 34.9 % — AB (ref 39.0–52.0)
HEMOGLOBIN: 11 g/dL — AB (ref 13.0–17.0)
MCH: 28.6 pg (ref 26.0–34.0)
MCHC: 31.5 g/dL (ref 30.0–36.0)
MCV: 90.6 fL (ref 78.0–100.0)
Platelets: 272 10*3/uL (ref 150–400)
RBC: 3.85 MIL/uL — ABNORMAL LOW (ref 4.22–5.81)
RDW: 15.7 % — AB (ref 11.5–15.5)
WBC: 10.2 10*3/uL (ref 4.0–10.5)

## 2014-06-18 MED ORDER — HALOPERIDOL LACTATE 5 MG/ML IJ SOLN
2.5000 mg | Freq: Four times a day (QID) | INTRAMUSCULAR | Status: DC | PRN
  Administered 2014-06-18: 2.5 mg via INTRAVENOUS

## 2014-06-18 MED ORDER — MENTHOL 3 MG MT LOZG
1.0000 | LOZENGE | OROMUCOSAL | Status: DC | PRN
  Administered 2014-06-18: 3 mg via ORAL
  Filled 2014-06-18: qty 9

## 2014-06-18 MED ORDER — HALOPERIDOL LACTATE 5 MG/ML IJ SOLN
2.5000 mg | Freq: Once | INTRAMUSCULAR | Status: AC
  Administered 2014-06-18: 2.5 mg via INTRAVENOUS
  Filled 2014-06-18: qty 1

## 2014-06-18 MED ORDER — HALOPERIDOL LACTATE 5 MG/ML IJ SOLN
INTRAMUSCULAR | Status: AC
  Administered 2014-06-18: 2.5 mg via INTRAVENOUS
  Filled 2014-06-18: qty 1

## 2014-06-18 MED ORDER — OLANZAPINE 7.5 MG PO TABS
7.5000 mg | ORAL_TABLET | Freq: Two times a day (BID) | ORAL | Status: DC
  Administered 2014-06-18 – 2014-06-20 (×4): 7.5 mg via ORAL
  Filled 2014-06-18 (×7): qty 1

## 2014-06-18 MED ORDER — OLANZAPINE 5 MG PO TBDP: 7.5000 mg | ORAL_TABLET | Freq: Two times a day (BID) | ORAL | Status: DC

## 2014-06-18 MED ORDER — POTASSIUM CHLORIDE CRYS ER 20 MEQ PO TBCR
40.0000 meq | EXTENDED_RELEASE_TABLET | Freq: Once | ORAL | Status: AC
Start: 2014-06-18 — End: 2014-06-18
  Administered 2014-06-18: 40 meq via ORAL

## 2014-06-18 NOTE — Evaluation (Signed)
Physical Therapy Evaluation Patient Details Name: [name] [name] MRN: [id number] DOB: 1933/10/10 Today's Date: 06/18/2014   History of Present Illness  81 yo male with recent hx of Robotic-assisted laparoscopic nephroureterectomy and ventral hernia repair as well as mild to mod dementia, s/p cabg end of 2015, CKD,  CABG, MI, TURP, anxiety admitted for confusion and agitation  Clinical Impression  Pt admitted with above diagnosis. Pt currently with functional limitations due to the deficits listed below (see PT Problem List).  Pt will benefit from skilled PT to increase their independence and safety with mobility to allow discharge to the venue listed below.   Pt very confused and restless.  Pt able to sit EOB however deferred further mobility for pt and staff safety as pt has strong movement and attempting to pull up on therapist however decreased cognition.  Caregiver in room reports pt does have 24/7 assist however they would provide supervision/set up, not physical assist.   Recommend SNF at this time.     Follow Up Recommendations SNF    Equipment Recommendations  None recommended by PT    Recommendations for Other Services       Precautions / Restrictions Precautions Precautions: Fall      Mobility  Bed Mobility Overal bed mobility: Needs Assistance;+2 for physical assistance Bed Mobility: Supine to Sit;Sit to Supine     Supine to sit: Mod assist Sit to supine: Max assist;+2 for physical assistance   General bed mobility comments: multi-modal  cues for technique, assist for trunk upright, cues for safety, more assist for back to bed due to cognition  Transfers Overall transfer level:  (not safe at this time due to cognition)                  Ambulation/Gait                Stairs            Wheelchair Mobility    Modified Rankin (Stroke Patients Only)       Balance                                             Pertinent  Vitals/Pain      Home Living Family/patient expects to be discharged to:: Private residence Living Arrangements: Alone Available Help at Discharge: Family;Available 24 hours/day;Personal care attendant Type of Home: House         Home Equipment: Kasandra Knudsen - single point;Wheelchair - [employer] - 2 wheels      Prior Function Level of Independence: Needs assistance   Gait / Transfers Assistance Needed: caregiver in room reports pt requires setup / supervision for mobility and ADLs           Hand Dominance        Extremity/Trunk Assessment               Lower Extremity Assessment: Generalized weakness (strong movements however unable to test or mobilize safely)         Communication   Communication: Other (comment) (decreased vision, possible MRI for "blindness" per neurology)  Cognition Arousal/Alertness: Awake/alert Behavior During Therapy: Impulsive;Restless Overall Cognitive Status: Impaired/Different from baseline Area of Impairment: Orientation;Memory;Safety/judgement;Awareness Orientation Level: Disoriented to;Place;Time;Situation   Memory: Decreased short-term memory   Safety/Judgement: Decreased awareness of safety;Decreased awareness of deficits   Problem Solving: Difficulty sequencing;Requires verbal cues;Slow processing General Comments:  pt very confused, hallucinating, "driving car"    General Comments      Exercises        Assessment/Plan    PT Assessment Patient needs continued PT services  PT Diagnosis Difficulty walking   PT Problem List Decreased strength;Decreased balance;Decreased mobility;Decreased activity tolerance;Decreased knowledge of use of DME;Decreased safety awareness  PT Treatment Interventions DME instruction;Gait training;Functional mobility training;Therapeutic activities;Therapeutic exercise;Balance training;Patient/family education;Wheelchair mobility training   PT Goals (Current goals can be found in the Care Plan  section) Acute Rehab PT Goals PT Goal Formulation: With patient/family Time For Goal Achievement: 07/02/14 Potential to Achieve Goals: Fair    Frequency Min 3X/week   Barriers to discharge        Co-evaluation               End of Session   Activity Tolerance: Other (comment) (limited by restlessness, decreased cognition) Patient left: with call bell/phone within reach;with family/visitor present;in bed;with bed alarm set           Time: 1660-6301 PT Time Calculation (min) (ACUTE ONLY): 16 min   Charges:   PT Evaluation $Initial PT Evaluation Tier I: 1 Procedure     PT G Codes:        Wisdom Rickey,KATHrine E 06/18/2014, 3:28 PM Carmelia Bake, PT, DPT 06/18/2014 Pager: [phone]

## 2014-06-18 NOTE — Progress Notes (Signed)
[facility]                                                                              Patient Demographics  [name] [name], is a 81 y.o. male, DOB - 08-22-1933, XTG:[id number]  Admit date - 06/12/2014   Admitting Physician Theressa Millard, MD  Outpatient Primary MD for the patient is Purvis Kilts, MD  LOS - 6   Chief Complaint  Patient presents with  . Altered Mental Status      HPI on 06/12/2014 by Dr. Derrill Kay 81 yo male h/o mild to mod dementia, s/p cabg end of 2015, s/p nephrectomy for TCC last week at [facility], ckd comes in with family for confusion and agitation today. Was d/c from [place] about 4 days ago. Has not really been eating or drinking well. Not been complaining of any pain but has not been sleeping well either. No fevers. No n/v/d. Has foley cath in since surgery, family thought there may be something wrong with it so they changed it today, and has been having good uop. He has fallen several times since hes been home and is generally getting weaker. He was on abx perioperatively but not sent home on any as his cultures were all negative. He had most recent urine culture this past Monday which family reports was negative. No focal neurological deficits. No swelling in his legs. Still has staples to surgical sites on abdomen which have been well healing, no discharge, no other rashes. Asked to admit for his delirium/encephalopathy. No coughing. No sob.  Assessment & Plan  Toxic Metabolic Encephalopathy with Delirium -Improved, no longer requiring restraints -Likely complicated by polypharmacy versus hypoxia -Status post right laparoscopic nephroureterectomy -Ammonia 12, TSH 13.484 -Urine cultures were negative, blood cultures negative -Psychiatry consulted -Currently on ceftriaxone  Demand ischemia with elevated troponin -Troponin trending downward, likely secondary to the above, no further workup  History of CABG -On  12/17/2013 -Currently not on beta blocker due to orthostasis, no ACE inhibitor due to renal failure -Continue aspirin 325 mg daily  Hypothyroidism -TSH 13.484, continue synthroid  Polypharmacy -Psychiatry consulted- patient does not need inpatient psych  Acute renal insufficiency with mild hypokalemia -Upon admission, creatinine was 2.15 -Baseline 1.2, currently 1.78 -Will continue gentle IVF -Continue to monitor BMP  Orthostatic hypotension -Midodrine and Florinef held- due to soft pressures  Obstructive sleep apnea -Unable to tolerate C-peptide night due to confusion  Transitional cell carcinoma -S/p laparoscopic nephroureterectomy -Urology following appreciated -Plan for cystogram today, and possible foley catheter removal 06/19/2014  Normocytic Anemia -Will continue to monitor CBC -Hemoglobin stable, currently 11  Code Status: Full  Family Communication: None at bedside  Disposition Plan: Admitted.  Mentation improved.  Planned cystogram today.   Time Spent in minutes   30 minutes  Procedures  Renal US  Consults   Urology Psychiatry Neurology  DVT Prophylaxis  SCDs  Lab Results  Component Value Date   PLT 272 06/18/2014    Medications  Scheduled Meds: . aspirin EC  325 mg Oral Daily  . cefTRIAXone (ROCEPHIN)  IV  1 g Intravenous Q24H  . donepezil  10 mg Oral QHS  . escitalopram  20 mg Oral Daily  . levothyroxine  150 mcg Oral QAC breakfast  . OLANZapine zydis  5 mg Oral BID  . pantoprazole  20 mg Oral q morning - 10a  . sodium chloride  3 mL Intravenous Q12H   Continuous Infusions: . sodium chloride 50 mL/hr at 06/17/14 1711   PRN Meds:.hyoscyamine, LORazepam, menthol-cetylpyridinium, ondansetron **OR** ondansetron (ZOFRAN) IV, opium-belladonna  Antibiotics    Anti-infectives    Start     Dose/Rate Route Frequency Ordered Stop   06/13/14 2000  cefTRIAXone (ROCEPHIN) 1 g in dextrose 5 % 50 mL IVPB - Premix     1 g 100 mL/hr over 30  Minutes Intravenous Every 24 hours 06/12/14 2354     06/12/14 2115  cefTRIAXone (ROCEPHIN) 1 g in dextrose 5 % 50 mL IVPB     1 g 100 mL/hr over 30 Minutes Intravenous  Once 06/12/14 2114 06/12/14 2207        Subjective:   [name] seen and examined today.  Patient currently has no complaints. Complains of bladder spasms.  Currently denies chest pain, shortness of breath, abdominal pain.  Objective:   Filed Vitals:   06/17/14 1320 06/17/14 1820 06/17/14 2141 06/18/14 0449  BP: 91/67 108/45 122/67 103/50  Pulse: 63 70 59 64  Temp: 98 F (36.7 C) 97.6 F (36.4 C) 97.4 F (36.3 C) 97.5 F (36.4 C)  TempSrc: Oral Oral Oral Axillary  Resp: 18 18 18 20   Height:      Weight:    99.3 kg (218 lb 14.7 oz)  SpO2: 100% 95% 97% 96%    Wt Readings from Last 3 Encounters:  06/18/14 99.3 kg (218 lb 14.7 oz)  06/05/14 107.049 kg (236 lb)  04/22/14 106.142 kg (234 lb)     Intake/Output Summary (Last 24 hours) at 06/18/14 1051 Last data filed at 06/18/14 0900  Gross per 24 hour  Intake 1410.83 ml  Output    400 ml  Net 1010.83 ml    Exam  General: Well developed, well nourished  HEENT: NCAT, mucous membranes moist.   Cardiovascular: S1 S2 auscultated, RRR  Respiratory: Clear to auscultation bilaterally with equal chest rise  Abdomen: Soft, nontender, nondistended, + bowel sounds  Extremities: warm dry without cyanosis clubbing or edema  Neuro: AAOx2.  Nonfocal.  Data Review   Micro Results Recent Results (from the past 240 hour(s))  Urine culture     Status: None   Collection Time: 06/12/14  6:45 PM  Result Value Ref Range Status   Specimen Description URINE, CATHETERIZED  Final   Special Requests NONE  Final   Colony Count NO GROWTH Performed at [facility]   Final   Culture NO GROWTH Performed at [facility]   Final   Report Status 06/15/2014 FINAL  Final  Blood culture (routine x 2)     Status: None   Collection Time: 06/12/14  9:25  PM  Result Value Ref Range Status   Specimen Description BLOOD LEFT ARM  Final   Special Requests BOTTLES DRAWN AEROBIC AND ANAEROBIC 6CC  Final   Culture NO GROWTH 5 DAYS  Final   Report Status 06/17/2014 FINAL  Final  Blood culture (routine x 2)     Status: None   Collection Time: 06/12/14  9:30 PM  Result Value Ref Range Status   Specimen Description BLOOD LEFT HAND  Final   Special Requests BOTTLES DRAWN AEROBIC AND ANAEROBIC 6CC  Final   Culture NO GROWTH  5 DAYS  Final   Report Status 06/17/2014 FINAL  Final  Urine culture     Status: None   Collection Time: 06/13/14  9:50 AM  Result Value Ref Range Status   Specimen Description URINE, CATHETERIZED  Final   Special Requests NONE  Final   Colony Count NO GROWTH Performed at [facility]   Final   Culture NO GROWTH Performed at [facility]   Final   Report Status 06/14/2014 FINAL  Final    Radiology Reports Dg Chest 2 View  06/12/2014   CLINICAL DATA:  Acute onset of altered mental status. Initial encounter.  EXAM: CHEST  2 VIEW  COMPARISON:  Chest radiograph performed 01/28/2014  FINDINGS: The lungs are well-aerated. Mild peribronchial thickening is noted. There is no evidence of focal opacification, pleural effusion or pneumothorax.  The heart is borderline normal in size. The patient is status post median sternotomy. No acute osseous abnormalities are seen.  IMPRESSION: Mild peribronchial thickening noted; lungs otherwise clear.   Electronically Signed   By: Garald Balding M.D.   On: 06/12/2014 20:41   Ct Head Wo Contrast  06/12/2014   CLINICAL DATA:  Confusion. Hallucination. Recent nephrectomy. Bladder cancer. Mild dementia.  EXAM: CT HEAD WITHOUT CONTRAST  TECHNIQUE: Contiguous axial images were obtained from the base of the skull through the vertex without intravenous contrast.  COMPARISON:  01/23/2014  FINDINGS: Sinuses/Soft tissues: Motion degradation. Mucosal thickening of bilateral maxillary sinuses. Fluid  in the right frontal sinus. Clear mastoid air cells.  Intracranial: Mild low density in the periventricular white matter likely related to small vessel disease. Cerebral volume loss which is normal for age. No mass lesion, hemorrhage, hydrocephalus, acute infarct, intra-axial, or extra-axial fluid collection.  IMPRESSION: 1.  No acute intracranial abnormality. 2. Sinus disease. 3. Mild small vessel ischemic change.   Electronically Signed   By: Abigail Miyamoto M.D.   On: 06/12/2014 20:27   US Renal  06/13/2014   CLINICAL DATA:  Elevated BUN/creatinine.  EXAM: RENAL/URINARY TRACT ULTRASOUND COMPLETE  COMPARISON:  CT 03/13/2014  FINDINGS: Right Kidney:  Surgically absent  Left Kidney: 12.0 cm. Normal renal cortical thickness for age. No hydronephrosis. Left renal collecting system stone or stones. Most well-defined measures 5 mm in the inter/lower pole region.  Bladder: Per clinical history, the patient has a Foley catheter. It is not visualized in the bladder, and the bladder is not decompressed.  IMPRESSION: 1. Status post right nephrectomy. 2. No evidence of left-sided hydronephrosis. At least 1 left renal collecting system calculus identified. 3. Foley catheter not identified within the urinary bladder. Bladder is not decompressed. Correlate with position and function of Foley catheter. These results will be called to the ordering clinician or representative by the Radiologist Assistant, and communication documented in the PACS or zVision Dashboard.   Electronically Signed   By: Abigail Miyamoto M.D.   On: 06/13/2014 12:29    CBC  Recent Labs Lab 06/12/14 1854 06/14/14 1105 06/15/14 0550 06/18/14 0440  WBC 11.1* 10.5 10.3 10.2  HGB 10.4* 10.4* 9.8* 11.0*  HCT 32.6* 32.2* 31.0* 34.9*  PLT 260 290 261 272  MCV 87.9 88.5 90.1 90.6  MCH 28.0 28.6 28.5 28.6  MCHC 31.9 32.3 31.6 31.5  RDW 15.4 15.6* 16.0* 15.7*  LYMPHSABS 1.7 0.7 1.8  --   MONOABS 0.9 0.4 0.8  --   EOSABS 0.8* 0.0 0.6  --   BASOSABS 0.1  0.0 0.1  --  Chemistries   Recent Labs Lab 06/12/14 1854 06/13/14 0650 06/14/14 1105 06/15/14 0550 06/18/14 0440  NA 137 138 140 143 140  K 3.2* 3.6 3.4* 3.7 3.3*  CL 102 104 108 109 106  CO2 26 30 27 28 29   GLUCOSE 96 93 137* 82 110*  BUN 26* 24* 18 19 19   CREATININE 2.15* 2.07* 1.71* 1.82* 1.78*  CALCIUM 9.0 8.4 8.6 8.6 8.6  AST  --  35 38* 35  --   ALT  --  16 18 18   --   ALKPHOS  --  53 49 51  --   BILITOT  --  0.7 0.5 0.5  --    ------------------------------------------------------------------------------------------------------------------ estimated creatinine clearance is 40.4 mL/min (by C-G formula based on Cr of 1.78). ------------------------------------------------------------------------------------------------------------------ No results for input(s): HGBA1C in the last 72 hours. ------------------------------------------------------------------------------------------------------------------ No results for input(s): CHOL, HDL, LDLCALC, TRIG, CHOLHDL, LDLDIRECT in the last 72 hours. ------------------------------------------------------------------------------------------------------------------ No results for input(s): TSH, T4TOTAL, T3FREE, THYROIDAB in the last 72 hours.  Invalid input(s): FREET3 ------------------------------------------------------------------------------------------------------------------  Recent Labs  06/15/14 1610 06/17/14 0800  VITAMINB12 609 867  FOLATE  --  18.8  FERRITIN  --  197  TIBC  --  205*  IRON  --  18*  RETICCTPCT  --  1.0    Coagulation profile No results for input(s): INR, PROTIME in the last 168 hours.  No results for input(s): DDIMER in the last 72 hours.  Cardiac Enzymes  Recent Labs Lab 06/13/14 0039 06/13/14 0650 06/13/14 1154  TROPONINI 0.25* 0.23* 0.20*   ------------------------------------------------------------------------------------------------------------------ Invalid input(s):  POCBNP    Jeannett Dekoning D.O. on 06/18/2014 at 10:51 AM  Between 7am to 7pm - Pager - [phone]  After 7pm go to [email or web address] - password [email or web address]  And look for the night coverage person covering for me after hours  [facility] Office  [phone]

## 2014-06-18 NOTE — Progress Notes (Signed)
Subjective: Patient confused again today.  Also seems to be unable to see anything in the room.  Sitting in bed.  No restraints.    Objective: Current vital signs: BP 103/50 mmHg  Pulse 64  Temp(Src) 97.5 F (36.4 C) (Axillary)  Resp 20  Ht 5\' 10"  (1.778 m)  Wt 99.3 kg (218 lb 14.7 oz)  BMI 31.41 kg/m2  SpO2 96% Vital signs in last 24 hours: Temp:  [97.4 F (36.3 C)-98 F (36.7 C)] 97.5 F (36.4 C) (03/10 0449) Pulse Rate:  [59-70] 64 (03/10 0449) Resp:  [18-20] 20 (03/10 0449) BP: (91-122)/(45-67) 103/50 mmHg (03/10 0449) SpO2:  [95 %-100 %] 96 % (03/10 0449) Weight:  [99.3 kg (218 lb 14.7 oz)] 99.3 kg (218 lb 14.7 oz) (03/10 0449)  Intake/Output from previous day: 03/09 0701 - 03/10 0700 In: 1410.8 [P.O.:720; I.V.:640.8; IV Piggyback:50] Out: 400 [Urine:400] Intake/Output this shift: Total I/O In: 240 [P.O.:240] Out: -  Nutritional status: Diet full liquid  Neurologic Exam: Mental Status: Awake and alert. Follows simple commands.Can not identify family member in the room.  Unable to answer orientation questions about where he is.    Cranial Nerves: II: Discs flat bilaterally; Blinks to bilateral confrontation but looks around room as if looking for me and to try to identify where my voice is coming from. Pupils equal, round, reactive to light and accommodation III,IV, VI: ptosis not present, extra-ocular motions intact bilaterally V,VII: decrease in the right NLF, facial light touch sensation normal bilaterally VIII: hearing normal bilaterally IX,X: gag reflex present XI: bilateral shoulder shrug XII: unable to test Motor: Right :Upper extremity 5/5Left: Upper extremity 5/5 Lower extremity 5/5Lower extremity 5/5 Tone and bulk:normal tone throughout; no atrophy noted Sensory: Responds to tactile stimuli in all extremities Deep Tendon Reflexes: 2+  and symmetric throughout Plantars: Right: downgoingLeft: downgoing   Lab Results: Basic Metabolic Panel:  Recent Labs Lab 06/12/14 1854 06/13/14 0650 06/14/14 1105 06/15/14 0550 06/18/14 0440  NA 137 138 140 143 140  K 3.2* 3.6 3.4* 3.7 3.3*  CL 102 104 108 109 106  CO2 26 30 27 28 29   GLUCOSE 96 93 137* 82 110*  BUN 26* 24* 18 19 19   CREATININE 2.15* 2.07* 1.71* 1.82* 1.78*  CALCIUM 9.0 8.4 8.6 8.6 8.6    Liver Function Tests:  Recent Labs Lab 06/13/14 0650 06/14/14 1105 06/15/14 0550  AST 35 38* 35  ALT 16 18 18   ALKPHOS 53 49 51  BILITOT 0.7 0.5 0.5  PROT 6.2 5.9* 5.6*  ALBUMIN 3.2* 3.1* 2.7*   No results for input(s): LIPASE, AMYLASE in the last 168 hours.  Recent Labs Lab 06/13/14 1150  AMMONIA 12    CBC:  Recent Labs Lab 06/12/14 1854 06/14/14 1105 06/15/14 0550 06/18/14 0440  WBC 11.1* 10.5 10.3 10.2  NEUTROABS 7.6 9.4* 7.0  --   HGB 10.4* 10.4* 9.8* 11.0*  HCT 32.6* 32.2* 31.0* 34.9*  MCV 87.9 88.5 90.1 90.6  PLT 260 290 261 272    Cardiac Enzymes:  Recent Labs Lab 06/12/14 1854 06/12/14 2125 06/13/14 0039 06/13/14 0650 06/13/14 1154  TROPONINI 0.31* 0.27* 0.25* 0.23* 0.20*    Lipid Panel: No results for input(s): CHOL, TRIG, HDL, CHOLHDL, VLDL, LDLCALC in the last 168 hours.  CBG:  Recent Labs Lab 06/13/14 0051 06/13/14 0739  GLUCAP 93 90    Microbiology: Results for orders placed or performed during the hospital encounter of 06/12/14  Urine culture     Status: None  Collection Time: 06/12/14  6:45 PM  Result Value Ref Range Status   Specimen Description URINE, CATHETERIZED  Final   Special Requests NONE  Final   Colony Count NO GROWTH Performed at [facility]   Final   Culture NO GROWTH Performed at [facility]   Final   Report Status 06/15/2014 FINAL  Final  Blood culture (routine x 2)     Status: None   Collection Time: 06/12/14  9:25 PM  Result Value  Ref Range Status   Specimen Description BLOOD LEFT ARM  Final   Special Requests BOTTLES DRAWN AEROBIC AND ANAEROBIC 6CC  Final   Culture NO GROWTH 5 DAYS  Final   Report Status 06/17/2014 FINAL  Final  Blood culture (routine x 2)     Status: None   Collection Time: 06/12/14  9:30 PM  Result Value Ref Range Status   Specimen Description BLOOD LEFT HAND  Final   Special Requests BOTTLES DRAWN AEROBIC AND ANAEROBIC 6CC  Final   Culture NO GROWTH 5 DAYS  Final   Report Status 06/17/2014 FINAL  Final  Urine culture     Status: None   Collection Time: 06/13/14  9:50 AM  Result Value Ref Range Status   Specimen Description URINE, CATHETERIZED  Final   Special Requests NONE  Final   Colony Count NO GROWTH Performed at [facility]   Final   Culture NO GROWTH Performed at [facility]   Final   Report Status 06/14/2014 FINAL  Final    Coagulation Studies: No results for input(s): LABPROT, INR in the last 72 hours.  Imaging: No results found.  Medications:  I have reviewed the patient's current medications. Scheduled: . aspirin EC  325 mg Oral Daily  . cefTRIAXone (ROCEPHIN)  IV  1 g Intravenous Q24H  . donepezil  10 mg Oral QHS  . escitalopram  20 mg Oral Daily  . levothyroxine  150 mcg Oral QAC breakfast  . OLANZapine zydis  5 mg Oral BID  . pantoprazole  20 mg Oral q morning - 10a  . sodium chloride  3 mL Intravenous Q12H    Assessment/Plan: Patient again confused.  Concerned about a cortical blindness based on exam findings this morning.  Lab work reasonably stable.  Patient not agitated, would image at this time.  Patient remains on ASA.    Recommendations: 1.  MRI of the brain without contrast  Neurology will follow up results of imaging and make further recommendations if necessary through contact with attending.  If there are any questions in the interim, the [facility] as listed on [place] may be called.     LOS: 6 days   Alexis Goodell, MD [facility] [phone] 06/18/2014  11:38 AM

## 2014-06-18 NOTE — Progress Notes (Signed)
ID: [id number] POD#13 from robotic assisted right nephro-ureterectomy, readmitted with encephalopathy.  Intv: remarkable turnaround in last 24-48 hours.  Mentation is still slow but he is resting quietly without agitation.  Alert this AM.  PE: AFVSS  Intake/Output Summary (Last 24 hours) at 06/18/14 0755 Last data filed at 06/18/14 0600  Gross per 24 hour  Intake 1410.83 ml  Output    400 ml  Net 1010.83 ml  abdomen is soft, incisions are intact without surrounding erythema Foley is draining clear yellow urine.   Recent Labs  06/18/14 0440  WBC 10.2  HGB 11.0*  HCT 34.9*    Recent Labs  06/18/14 0440  NA 140  K 3.3*  CL 106  CO2 29  GLUCOSE 110*  BUN 19  CREATININE 1.78*  CALCIUM 8.6   No results for input(s): LABPT, INR in the last 72 hours. No results for input(s): LABURIN in the last 72 hours. Results for orders placed or performed during the hospital encounter of 06/12/14  Urine culture     Status: None   Collection Time: 06/12/14  6:45 PM  Result Value Ref Range Status   Specimen Description URINE, CATHETERIZED  Final   Special Requests NONE  Final   Colony Count NO GROWTH Performed at [facility]   Final   Culture NO GROWTH Performed at [facility]   Final   Report Status 06/15/2014 FINAL  Final  Blood culture (routine x 2)     Status: None   Collection Time: 06/12/14  9:25 PM  Result Value Ref Range Status   Specimen Description BLOOD LEFT ARM  Final   Special Requests BOTTLES DRAWN AEROBIC AND ANAEROBIC 6CC  Final   Culture NO GROWTH 5 DAYS  Final   Report Status 06/17/2014 FINAL  Final  Blood culture (routine x 2)     Status: None   Collection Time: 06/12/14  9:30 PM  Result Value Ref Range Status   Specimen Description BLOOD LEFT HAND  Final   Special Requests BOTTLES DRAWN AEROBIC AND ANAEROBIC 6CC  Final   Culture NO GROWTH 5 DAYS  Final   Report Status 06/17/2014 FINAL  Final  Urine culture     Status: None   Collection Time:  06/13/14  9:50 AM  Result Value Ref Range Status   Specimen Description URINE, CATHETERIZED  Final   Special Requests NONE  Final   Colony Count NO GROWTH Performed at [facility]   Final   Culture NO GROWTH Performed at [facility]   Final   Report Status 06/14/2014 FINAL  Final     Imp: s/p R RAL nephro-ureterectomy, readmitted with encephalopathy. Rec: Cystogram today Stables out with steri-strips today ? Voiding trial today vs tomorrow morning depending on time of cystogram  Will continue to follow.

## 2014-06-18 NOTE — Consult Note (Signed)
Psychiatry Consult follow-up  Reason for Consult:  Medication management for dementia with agitation and AMS Referring Physician:  Dr. Verlon Au  Patient Identification: [name] [name] MRN:  [id number] Principal Diagnosis: Dementia without behavioral disturbance Diagnosis:   Patient Active Problem List   Diagnosis Date Noted  . Blindness [H54.0]   . NSTEMI (non-ST elevated myocardial infarction) [I21.4]   . Renal insufficiency [N28.9]   . S/p nephrectomy [Z90.5] 06/12/2014  . Acute renal failure [N17.9] 06/12/2014  . UTI (lower urinary tract infection) [N39.0] 06/12/2014  . Metabolic encephalopathy [N82.95] 06/12/2014  . Elevated troponin [R79.89] 06/12/2014  . Altered mental state [R41.82] 06/12/2014  . Altered mental status [R41.82]   . Transitional cell carcinoma of kidney [C64.9] 06/05/2014  . TIA (transient ischemic attack) [G45.9] 01/23/2014  . Dementia without behavioral disturbance [F03.90] 01/23/2014  . Prolonged Q-T interval on ECG [I45.81] 01/23/2014  . DM type 2 (diabetes mellitus, type 2) [E11.9] 01/23/2014  . Anemia, post op [D64.9] 01/13/2014  . CAD, multiple vessel [I25.10] 12/18/2013  . S/P CABG x 4 [Z95.1] 12/18/2013  . ST elevation myocardial infarction (STEMI) of inferior wall, initial episode of care [I21.19] 12/17/2013  . Hypoxemia [R09.02] 11/19/2013  . Orthostatic hypotension [I95.1] 11/19/2013  . Dysphagia, unspecified(787.20) [R13.10] 12/12/2012  . Dizziness [R42] 11/07/2012  . Hyperlipidemia [E78.5] 11/07/2012  . Obstructive sleep apnea [G47.33] 11/07/2012    Total Time spent with patient: 20 minutes  Subjective:   [name] [name] is a 81 y.o. male patient admitted with AMS.  HPI: [name] [name] is a 81 years old male seen today and chart reviewed. Case discussed with the patient youngest son and daughter-in-law who were at bedside and also Dr. Verlon Au.  Patient was not able to contribute to this evaluation secondary to being sedated with medication for  increased agitation and aggressive behavior and also required soft restraints. Reportedly patient has been living by himself and has been assisted by family and also help hired for 24 hours. Patient has been deteriorated since he has been suffering with multiple medical problems including heart attack, CABG, bladder cancer, kidney surgery and recently become psychotic with delusional thinking about his girlfriend has been married someone else, not sleeping well, not eating well and become irritable, agitated and aggressive. Patient has a history of moderate dementia and has been treated with medication from primary care physician over 2 years. Patient has no history of psychiatric treatment. Patient has no history of suicidal or homicidal ideation, intention or plans.  Interval history: Patient seen for psych follow-up. Patient has been confused and seems to be responding to internal stimuli and unable to recognize  daughter-in-law who is at bedside. Patient responded verbally and stated he is able to walk around and hallway when he was not able to even stand on his feet. Patient has no reported agitation or aggressive behavior. Patient still has poor appetite and eating limited breakfast. Patient has been compliant with his medication and has a better sleep but poor appetite. Patient denies current suicidal or homicidal ideation, intention or plans. Neurology recommended MRI scan of the brain and follow up with him. Patient may benefit from increased dose of Zyprexa secondary to responding to internal stimuli and not eating well and also poor sleep. Patient daughter is concerned about case management pending patient need to be discharged soon when he was not able to care for himself or even walk at this current time.  Medical history: 38 ? Mod Dementia, OSA/OHSS on CPAP, STEMI s/p cath 12/17/13 +  CAGB, Orthostatic hypotension on both Midodrine + florinef. 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 Louis [name] 06/05/14 admitted from [facility] 06/12/14 with toxic metabolic encephalopathy in the setting of progressive worsening of renal function. Apparently patient had done well status post surgery and was discharged~2/28 he did well initially but on 3/2 it was noted that he had decreased sleep, increased agitation, and more confusion than usual. He has been diagnosed with dementia~2 years ago and had been placed on Aricept-his family confirm his dementia is mild to moderate- he was still able to cook for himself prior to his CABG and does need some assistance with daily but for the most part is pretty independent. Urology was consulted with regards to his recent surgery as this may been a postoperative complication   Past Medical History:  Past Medical History  Diagnosis Date  . Chronic constipation   . GERD (gastroesophageal reflux disease)   . Arthritis   . Hyperlipidemia   . Orthostatic hypotension 11/19/2013  . Coronary artery disease     CARDIOLOGIST-  DR BERRY  --  HX  STEMI  12-17-2013  S/P  CABG X4  12-18-2013  . Mild dementia   . Type 2 diabetes mellitus   . Hypothyroidism   . History of esophageal dilatation   . History of gastritis   . History of hiatal hernia   . History of bladder cancer     hx  TCC low grade  . Prostate cancer     low-grade  . Kidney tumor     right  . BPH (benign prostatic hyperplasia)   . CKD (chronic kidney disease) stage 3, GFR 30-59 ml/min   . History of kidney stones   . ED (erectile dysfunction)   . S/P CABG x 4     12-18-2013  . Bladder tumor   . Complication of anesthesia     HARD TO WAKE  . OSA on CPAP     mild osa   per sleep study 07-11-2009- patient states does not use regularly    Past Surgical History   Procedure Laterality Date  . Upper gastrointestinal endoscopy  04/06/2010  &  01-08-2013    w/ esophageal dilatation  . Hemorrhoid surgery    . Hand surgery      right  . Wrist fracture surgery      pins placed-left  . Cystoscopy with biopsy  01/09/2012    Procedure: CYSTOSCOPY WITH BIOPSY;  Surgeon: Marissa Nestle, MD;  Location: [facility]S;  Service: Urology;  Laterality: N/A;  Cystoscopy with Multiple Bladder Biopsies  . Esophagogastroduodenoscopy (egd) with esophageal dilation N/A 01/08/2013    Procedure: ESOPHAGOGASTRODUODENOSCOPY (EGD) WITH ESOPHAGEAL DILATION;  Surgeon: Rogene Houston, MD;  Location: [facility] END[facility] SUITE;  Service: Endoscopy;  Laterality: N/A;  120  . Left heart cath N/A 12/17/2013    Procedure: LEFT HEART CATH;  Surgeon: Leonie Man, MD;  Location: [facility] CATH LAB;  Service: Cardiovascular;  Laterality: N/A;  . Left heart catheterization with coronary angiogram N/A 12/17/2013    Procedure: LEFT HEART CATHETERIZATION WITH CORONARY ANGIOGRAM;  Surgeon: Leonie Man, MD;  Location: [facility] CATH LAB;  Service: Cardiovascular;  Laterality: N/A;  . Colonoscopy with esophagogastroduodenoscopy (egd)  08-15-2006    w/  esophageal dilatation  . Umbilical hernia repair  04-24-2003  . Transurethral resection of bladder tumor  06-14-2004  . Transurethral resection of prostate  10-04-2004  . Cysto/  removal bladder calculus/  resection bladder tumor  10-16-2006  . Cystostomy w/ bladder biopsy  01-09-2012  . Cardiac catheterization  12-17-2013   dr Shanon Brow harding    severe disease RCA with thrombotic appearing subtotal occlusions/  95-99%  OM2/  diffuse disease LAD  and D1/  mild to moderate basal to mid inferior hypokinesis/  severe elevated LVEDP/  ef 45-50%  . Coronary artery bypass graft N/A 12/18/2013    Procedure: Coronary artery bypass grafting times four using left internal mammary artery and endoscopically harvested right saphenous vein graft;  Surgeon: Gaye Pollack, MD;  Location: [facility]  OR;  Service: Open Heart Surgery;  Laterality: N/A;  . Transthoracic echocardiogram  11-14-2012    grade I diastolic dysfunction/  ef 55-60%/  trivial MR  and TR  . Cardiovascular stress test  08-09-2009  dr berry    mild to moderate perfusion defect in basal , mid, and apical inferior regions consistent with an infart/scar/   No evidence ischemia/  ef 51%/  no ECG changes/   low risk scan  . Transurethral resection of bladder tumor N/A 04/07/2014    Procedure: TRANSURETHRAL RESECTION OF BLADDER TUMOR (TURBT);  Surgeon: Arvil Persons, MD;  Location: [facility];  Service: Urology;  Laterality: N/A;  . Cystoscopy/retrograde/ureteroscopy Right 04/07/2014    Procedure: CYSTOSCOPY/RETROGRADE/URETEROSCOPY WITH BIOPSY OF RENAL PELVIS TUMOR;  Surgeon: Arvil Persons, MD;  Location: [facility];  Service: Urology;  Laterality: Right;  . Robot assisted laparoscopic nephrectomy Right 06/05/2014    Procedure: ROBOTIC ASSISTED LAPAROSCOPIC RIGHT NEPHROURETERECTOMY, VENTRAL HERNIA REPAIR ;  Surgeon: Ardis Hughs, MD;  Location: [facility] ORS;  Service: Urology;  Laterality: Right;  . Cystoscopy w/ ureteral stent placement Right 06/05/2014    Procedure: CYSTOSCOPY ;  Surgeon: Ardis Hughs, MD;  Location: [facility] ORS;  Service: Urology;  Laterality: Right;   Family History:  Family History  Problem Relation Age of Onset  . Cancer - Lung Brother    Social History:  History  Alcohol Use No     History  Drug Use No    History   Social History  . Marital Status: Divorced    Spouse Name: N/A  . Number of Children: 3  . Years of Education: HS   Occupational History  .     Social History Main Topics  . Smoking status: Former Smoker -- 1.00 packs/day for 4 years    Types: Cigarettes  . Smokeless tobacco: Former Systems developer    Types: [place] date: 01/03/1960  . Alcohol Use: No  . Drug Use: No  . Sexual Activity: Yes    Birth Control/ Protection: None   Other Topics  Concern  . None   Social History Narrative   Patient is single and lives alone.   Patient is retired.   Patient has a high school education.   Patient is right-handed.   Patient drinks one cup of coffee and one cup of soda or tea daily.   Patient has three adult children.    Allergies:   Allergies  Allergen Reactions  . Statins Other (See Comments)    MUSCLE ACHES    Vitals: Blood pressure 131/76, pulse 77, temperature 97.8 F (36.6 C), temperature source Oral, resp. rate 18, height 5\' 10"  (  1.778 m), weight 99.3 kg (218 lb 14.7 oz), SpO2 97 %.  Risk to Self: Is patient at risk for suicide?: No Risk to Others:   Prior Inpatient Therapy:   Prior Outpatient Therapy:    Current Facility-Administered Medications  Medication Dose Route Frequency Provider Last Rate Last Dose  . 0.9 %  sodium chloride infusion   Intravenous Continuous Maryann Mikhail, DO 50 mL/hr at 06/17/14 1711    . aspirin EC tablet 325 mg  325 mg Oral Daily Phillips Grout, MD   325 mg at 06/18/14 1100  . cefTRIAXone (ROCEPHIN) 1 g in dextrose 5 % 50 mL IVPB - Premix  1 g Intravenous Q24H Phillips Grout, MD   1 g at 06/17/14 1939  . donepezil (ARICEPT) tablet 10 mg  10 mg Oral QHS Nita Sells, MD   10 mg at 06/17/14 2212  . escitalopram (LEXAPRO) tablet 20 mg  20 mg Oral Daily Nita Sells, MD   20 mg at 06/18/14 1100  . hyoscyamine (LEVSIN SL) SL tablet 0.125 mg  0.125 mg Sublingual Q6H PRN Irine Seal, MD   0.125 mg at 06/17/14 1548  . levothyroxine (SYNTHROID, LEVOTHROID) tablet 150 mcg  150 mcg Oral QAC breakfast Nita Sells, MD   150 mcg at 06/18/14 1100  . LORazepam (ATIVAN) injection 1-2 mg  1-2 mg Intravenous Q4H PRN Nita Sells, MD   2 mg at 06/16/14 1601  . menthol-cetylpyridinium (CEPACOL) lozenge 3 mg  1 lozenge Oral PRN Rhetta Mura Schorr, NP   3 mg at 06/18/14 0410  . OLANZapine zydis (ZYPREXA) disintegrating tablet 5 mg  5 mg Oral BID Nita Sells, MD   5 mg at  06/18/14 1050  . ondansetron (ZOFRAN) tablet 4 mg  4 mg Oral Q6H PRN Phillips Grout, MD       Or  . ondansetron (ZOFRAN) injection 4 mg  4 mg Intravenous Q6H PRN Phillips Grout, MD      . opium-belladonna (B&O SUPPRETTES) 16.2-60 MG suppository 1 suppository  1 suppository Rectal Q6H PRN Cristal Ford, DO   1 suppository at 06/18/14 0410  . pantoprazole (PROTONIX) EC tablet 20 mg  20 mg Oral q morning - 10a Nita Sells, MD   20 mg at 06/18/14 1049  . sodium chloride 0.9 % injection 3 mL  3 mL Intravenous Q12H Phillips Grout, MD   3 mL at 06/18/14 1101    Musculoskeletal: Strength & Muscle Tone: decreased Gait & Station: unable to stand Patient leans: N/A  Psychiatric Specialty Exam: Physical Exam as per history and physical   ROS agitation, aggression, cognitive deficits   Blood pressure 131/76, pulse 77, temperature 97.8 F (36.6 C), temperature source Oral, resp. rate 18, height 5\' 10"  (1.778 m), weight 99.3 kg (218 lb 14.7 oz), SpO2 97 %.Body mass index is 31.41 kg/(m^2).  General Appearance: Disheveled and Guarded  Eye Contact::  Minimal  Speech:  Slow and Slurred  Volume:  Decreased  Mood:  Anxious  Affect:  Non-Congruent and Inappropriate  Thought Process:  Disorganized and Irrelevant  Orientation:  Negative  Thought Content:  Delusions and Rumination, responding to internal stimuli   Suicidal Thoughts:  No  Homicidal Thoughts:  No  Memory:  Immediate;   Poor Recent;   Poor  Judgement:  Impaired  Insight:  Lacking  Psychomotor Activity:  Restlessness  Concentration:  Poor  Recall:  Poor  Fund of Knowledge:NA  Language: Poor  Akathisia:  Negative  Handed:  Right  AIMS (if indicated):     Assets:  Financial Resources/Insurance Housing Intimacy Leisure Time Social Support  ADL's:  Impaired  Cognition: Impaired,  Moderate  Sleep:      Medical Decision Making: New problem, with additional work up planned, Review of Psycho-Social Stressors (1), Review or  order clinical lab tests (1), Discuss test with performing physician (1), Established Problem, Worsening (2), Review or order medicine tests (1), Review of Medication Regimen & Side Effects (2) and Review of New Medication or Change in Dosage (2)  Treatment Plan Summary: Daily contact with patient to assess and evaluate symptoms and progress in treatment and Medication management  Plan:  Increase Zyprexa Zydis 7.5 mg twice daily Patient does not meet criteria for psychiatric inpatient admission. Supportive therapy provided about ongoing stressors.  Appreciate psychiatric consultation and follow up as clinically required Please contact [phone] or [phone] if needs further assistance  Disposition: Patient may benefit from SNP placement when medically stable.  Parnika Tweten,JANARDHAHA R. 06/18/2014 3:32 PM

## 2014-06-18 NOTE — Progress Notes (Signed)
Assumed care of pt at this time. Pt stable with no complaints. Will continue to monitor.   Deutsch, Dandra Velardi  

## 2014-06-18 NOTE — Progress Notes (Signed)
After application of restraints and administration of Haldol per NP order, RN left pt in care of son at bedside. Upon return to room, pt was still found to be restless, and family states that pt "wasn't no worse and wasn't no better". NP notified. Restraints remain in place for pt safety. Will continue to monitor.

## 2014-06-18 NOTE — Progress Notes (Signed)
Pt with increased restlessness this PM. Pullings at tubes/lines, attempting to get oob. Ativan PRN given as well as B&O supp. Incase foley was causing bladder spasms. Pt agitation seems to have worsened with Ativan as well as is now hallucination. Family at bedside states that he received medication at 1500 on Sunday 3/6 that caused pt to rest. Appears that pt received 10mg  of Geodon IM at that time. NP on call notified of pt condition. Awaiting return call or orders.

## 2014-06-19 DIAGNOSIS — N139 Obstructive and reflux uropathy, unspecified: Secondary | ICD-10-CM | POA: Insufficient documentation

## 2014-06-19 DIAGNOSIS — N390 Urinary tract infection, site not specified: Secondary | ICD-10-CM | POA: Insufficient documentation

## 2014-06-19 LAB — BASIC METABOLIC PANEL
Anion gap: 8 (ref 5–15)
BUN: 16 mg/dL (ref 6–23)
CO2: 30 mmol/L (ref 19–32)
Calcium: 9 mg/dL (ref 8.4–10.5)
Chloride: 107 mmol/L (ref 96–112)
Creatinine, Ser: 1.67 mg/dL — ABNORMAL HIGH (ref 0.50–1.35)
GFR, EST AFRICAN AMERICAN: 44 mL/min — AB (ref >90)
GFR, EST NON AFRICAN AMERICAN: 38 mL/min — AB (ref >90)
Glucose, Bld: 91 mg/dL (ref 70–99)
Potassium: 3.9 mmol/L (ref 3.5–5.1)
SODIUM: 145 mmol/L (ref 135–145)

## 2014-06-19 MED ORDER — HALOPERIDOL LACTATE 5 MG/ML IJ SOLN
2.5000 mg | Freq: Once | INTRAMUSCULAR | Status: AC
  Administered 2014-06-19: 2.5 mg via INTRAVENOUS
  Filled 2014-06-19: qty 1

## 2014-06-19 MED ORDER — HALOPERIDOL LACTATE 5 MG/ML IJ SOLN: 2.5000 mg | Freq: Four times a day (QID) | INTRAMUSCULAR | Status: DC | PRN

## 2014-06-19 MED ORDER — ENSURE COMPLETE PO LIQD
237.0000 mL | Freq: Three times a day (TID) | ORAL | Status: DC
  Administered 2014-06-20 – 2014-06-21 (×2): 237 mL via ORAL

## 2014-06-19 NOTE — Progress Notes (Signed)
Clinical Social Work  CSW rounded with psych MD to meet with patient at bedside. Patient laying in bed and slept during majority of session. Caregiver at bedside and reports she has sat with patient for about 2 months and patient is acting differently than he had prior to admission. Caregiver reports patient was calm and cooperative at home until his last surgery. Patient unable to have any meaningful conversation at this time.   CSW will continue to follow.  [place] Walk, [place] [phone]

## 2014-06-19 NOTE — Progress Notes (Signed)
INITIAL NUTRITION ASSESSMENT  DOCUMENTATION CODES Per approved criteria  -Not Applicable   INTERVENTION: Provide Ensure TID, each provides 350 kcal, 13 grams of protein and 180 ml free water  Provide Magic Cup BID, each provides 290 kcal, 9 grams of protein  NUTRITION DIAGNOSIS: Inadequate oral intake related to poor appetite as evidenced by decreased PO intake.   Goal: Pt to meet >/= 90% of estimated energy needs  Monitor:  PO intake, mentation, weight trends, labs, I/O's  Reason for Assessment: C/s for assessment of nutrition requirements/status  81 y.o. male  Admitting Dx: Dementia without behavioral disturbance  ASSESSMENT: 81 y/o male with history of mild to moderate dementia, s/p CABG end of 2015, s/p nephrectomy for TCC last week at [facility], CKD. Presented with confusion and agitation today. He has not been eating or drinking well for past 4 days.  Caregiver was in room upon assessment. She reported taking care of pt 4 days out of the week. Pt was not able to communicate.  Per caregiver, pt normally eats well, and drinks Ensure TID at home. Caregiver reported no weight loss or appetite changes until admission. Caregiver also reported that he likes ice cream. Pt reported feeling hungry at this time, and was ready for his dinner. Pt also has difficulty chewing or swallowing and requires feeding assistance.  Provide Ensure TID and Magic Cup BID. Encourage PO intake, and monitor  Height: Ht Readings from Last 1 Encounters:  06/12/14 5\' 10"  (1.778 m)    Weight: Wt Readings from Last 1 Encounters:  06/19/14 217 lb 6 oz (98.6 kg)    Ideal Body Weight: 166 lb (75.4 kg)  % Ideal Body Weight: 130%  Wt Readings from Last 10 Encounters:  06/19/14 217 lb 6 oz (98.6 kg)  06/05/14 236 lb (107.049 kg)  04/22/14 234 lb (106.142 kg)  04/07/14 234 lb (106.142 kg)  02/24/14 232 lb (105.235 kg)  01/28/14 237 lb (107.502 kg)  01/23/14 237 lb 14 oz (107.9 kg)  01/13/14 237 lb  14.4 oz (107.911 kg)  12/24/13 246 lb 7.6 oz (111.8 kg)  11/19/13 250 lb (113.399 kg)    Usual Body Weight: unknown  % Usual Body Weight: -  BMI:  Body mass index is 31.19 kg/(m^2).  Estimated Nutritional Needs: Kcal: 1950-2100 Protein: 100-110 grams Fluid: >/=2L daily  Skin: intact  Diet Order: Diet full liquid  EDUCATION NEEDS: -No education needs identified at this time   Intake/Output Summary (Last 24 hours) at 06/19/14 1236 Last data filed at 06/19/14 0705  Gross per 24 hour  Intake    935 ml  Output   1750 ml  Net   -815 ml    Last BM: 3/7  Labs:   Recent Labs Lab 06/15/14 0550 06/18/14 0440 06/19/14 0520  NA 143 140 145  K 3.7 3.3* 3.9  CL 109 106 107  CO2 28 29 30   BUN 19 19 16   CREATININE 1.82* 1.78* 1.67*  CALCIUM 8.6 8.6 9.0  GLUCOSE 82 110* 91    CBG (last 3)  No results for input(s): GLUCAP in the last 72 hours.  Scheduled Meds: . aspirin EC  325 mg Oral Daily  . donepezil  10 mg Oral QHS  . escitalopram  20 mg Oral Daily  . haloperidol lactate  2.5 mg Intravenous Once  . levothyroxine  150 mcg Oral QAC breakfast  . OLANZapine  7.5 mg Oral BID  . pantoprazole  20 mg Oral q morning - 10a  . sodium  chloride  3 mL Intravenous Q12H    Continuous Infusions: . sodium chloride 50 mL/hr at 06/18/14 2325    Past Medical History  Diagnosis Date  . Chronic constipation   . GERD (gastroesophageal reflux disease)   . Arthritis   . Hyperlipidemia   . Orthostatic hypotension 11/19/2013  . Coronary artery disease     CARDIOLOGIST-  DR BERRY  --  HX  STEMI  12-17-2013  S/P  CABG X4  12-18-2013  . Mild dementia   . Type 2 diabetes mellitus   . Hypothyroidism   . History of esophageal dilatation   . History of gastritis   . History of hiatal hernia   . History of bladder cancer     hx  TCC low grade  . Prostate cancer     low-grade  . Kidney tumor     right  . BPH (benign prostatic hyperplasia)   . CKD (chronic kidney disease) stage  3, GFR 30-59 ml/min   . History of kidney stones   . ED (erectile dysfunction)   . S/P CABG x 4     12-18-2013  . Bladder tumor   . Complication of anesthesia     HARD TO WAKE  . OSA on CPAP     mild osa   per sleep study 07-11-2009- patient states does not use regularly    Past Surgical History  Procedure Laterality Date  . Upper gastrointestinal endoscopy  04/06/2010  &  01-08-2013    w/ esophageal dilatation  . Hemorrhoid surgery    . Hand surgery      right  . Wrist fracture surgery      pins placed-left  . Cystoscopy with biopsy  01/09/2012    Procedure: CYSTOSCOPY WITH BIOPSY;  Surgeon: Marissa Nestle, MD;  Location: [facility] ORS;  Service: Urology;  Laterality: N/A;  Cystoscopy with Multiple Bladder Biopsies  . Esophagogastroduodenoscopy (egd) with esophageal dilation N/A 01/08/2013    Procedure: ESOPHAGOGASTRODUODENOSCOPY (EGD) WITH ESOPHAGEAL DILATION;  Surgeon: Rogene Houston, MD;  Location: [facility] ENDO SUITE;  Service: Endoscopy;  Laterality: N/A;  120  . Left heart cath N/A 12/17/2013    Procedure: LEFT HEART CATH;  Surgeon: Leonie Man, MD;  Location: [facility] CATH LAB;  Service: Cardiovascular;  Laterality: N/A;  . Left heart catheterization with coronary angiogram N/A 12/17/2013    Procedure: LEFT HEART CATHETERIZATION WITH CORONARY ANGIOGRAM;  Surgeon: Leonie Man, MD;  Location: [facility] CATH LAB;  Service: Cardiovascular;  Laterality: N/A;  . Colonoscopy with esophagogastroduodenoscopy (egd)  08-15-2006    w/ esophageal dilatation  . Umbilical hernia repair  04-24-2003  . Transurethral resection of bladder tumor  06-14-2004  . Transurethral resection of prostate  10-04-2004  . Cysto/  removal bladder calculus/  resection bladder tumor  10-16-2006  . Cystostomy w/ bladder biopsy  01-09-2012  . Cardiac catheterization  12-17-2013   dr Shanon Brow harding    severe disease RCA with thrombotic appearing subtotal occlusions/  95-99%  OM2/  diffuse disease LAD  and D1/  mild to moderate  basal to mid inferior hypokinesis/  severe elevated LVEDP/  ef 45-50%  . Coronary artery bypass graft N/A 12/18/2013    Procedure: Coronary artery bypass grafting times four using left internal mammary artery and endoscopically harvested right saphenous vein graft;  Surgeon: Gaye Pollack, MD;  Location: [facility] OR;  Service: Open Heart Surgery;  Laterality: N/A;  . Transthoracic echocardiogram  11-14-2012    grade I diastolic dysfunction/  ef 55-60%/  trivial MR  and TR  . Cardiovascular stress test  08-09-2009  dr berry    mild to moderate perfusion defect in basal , mid, and apical inferior regions consistent with an infart/scar/   No evidence ischemia/  ef 51%/  no ECG changes/   low risk scan  . Transurethral resection of bladder tumor N/A 04/07/2014    Procedure: TRANSURETHRAL RESECTION OF BLADDER TUMOR (TURBT);  Surgeon: Arvil Persons, MD;  Location: [facility];  Service: Urology;  Laterality: N/A;  . Cystoscopy/retrograde/ureteroscopy Right 04/07/2014    Procedure: CYSTOSCOPY/RETROGRADE/URETEROSCOPY WITH BIOPSY OF RENAL PELVIS TUMOR;  Surgeon: Arvil Persons, MD;  Location: [facility];  Service: Urology;  Laterality: Right;  . Robot assisted laparoscopic nephrectomy Right 06/05/2014    Procedure: ROBOTIC ASSISTED LAPAROSCOPIC RIGHT NEPHROURETERECTOMY, VENTRAL HERNIA REPAIR ;  Surgeon: Ardis Hughs, MD;  Location: [facility] ORS;  Service: Urology;  Laterality: Right;  . Cystoscopy w/ ureteral stent placement Right 06/05/2014    Procedure: CYSTOSCOPY ;  Surgeon: Ardis Hughs, MD;  Location: [facility] ORS;  Service: Urology;  Laterality: Right;    Wynona Dove, MS Dietetic Intern Pager: [phone]

## 2014-06-19 NOTE — Progress Notes (Signed)
Family member at bedside states he has not been wearing. CPAP not in room. Per family he has been pulling mask off while sleeping. Pt is in restraints at this time. Will continue to check on patient.

## 2014-06-19 NOTE — Progress Notes (Signed)
[facility]                                                                              Patient Demographics  [name] [name], is a 81 y.o. male, DOB - May 25, 1933, STM:[id number]  Admit date - 06/12/2014   Admitting Physician Theressa Millard, MD  Outpatient Primary MD for the patient is Purvis Kilts, MD  LOS - 7   Chief Complaint  Patient presents with  . Altered Mental Status      HPI on 06/12/2014 by Dr. Derrill Kay 81 yo male h/o mild to mod dementia, s/p cabg end of 2015, s/p nephrectomy for TCC last week at [facility], ckd comes in with family for confusion and agitation today. Was d/c from [place] about 4 days ago. Has not really been eating or drinking well. Not been complaining of any pain but has not been sleeping well either. No fevers. No n/v/d. Has foley cath in since surgery, family thought there may be something wrong with it so they changed it today, and has been having good uop. He has fallen several times since hes been home and is generally getting weaker. He was on abx perioperatively but not sent home on any as his cultures were all negative. He had most recent urine culture this past Monday which family reports was negative. No focal neurological deficits. No swelling in his legs. Still has staples to surgical sites on abdomen which have been well healing, no discharge, no other rashes. Asked to admit for his delirium/encephalopathy. No coughing. No sob.  Assessment & Plan  Toxic Metabolic Encephalopathy with Delirium -Waxes and wanes -Patient became altered last night and was given haldol as well as put back in restraints- however, upon examining him this morning, patient seems to be back to his baseline? -Likely complicated by polypharmacy versus hypoxia -Status post right laparoscopic nephroureterectomy and cystoscopy -Ammonia 12, TSH 13.484 -Urine cultures were negative, blood cultures negative -Psychiatry consulted -Currently on  ceftriaxone -Neurology consulted and appreciated- it seems upon evaluation 3/10, patient was having visual problems, however this seems to have subsided.  MRI of the brain was ordered, however, son at bedside, does not wish to have the MRI done due to sedation or anesthesia, and feels this is unnecessary.   Demand ischemia with elevated troponin -Troponin trending downward, likely secondary to the above, no further workup  History of CABG -On 12/17/2013 -Currently not on beta blocker due to orthostasis, no ACE inhibitor due to renal failure -Continue aspirin 325 mg daily  Hypothyroidism -TSH 13.484, continue synthroid  Polypharmacy -Psychiatry consulted- patient does not need inpatient psych -Psychiatry titrating meds, Zyprexa 7.5mg  BID, lexapro  Acute renal insufficiency with mild hypokalemia -Upon admission, creatinine was 2.15 -Baseline 1.2, currently 1.67 -Continue to monitor BMP  Orthostatic hypotension -Midodrine and Florinef held- due to soft pressures  Obstructive sleep apnea -Unable to tolerate C-peptide night due to confusion  Transitional cell carcinoma -S/p laparoscopic nephroureterectomy -Urology following appreciated -Cystogram 3/10, will remove foley catheter today  Normocytic Anemia -Will continue to monitor CBC -Hemoglobin stable, currently 11  Malnutrition -Will consult nutrition and speech  Code Status: Full  Family Communication:Son and caregiver at bedside  Disposition Plan: Admitted.  Mentation improved.    Time Spent in minutes   30 minutes  Procedures  Renal US  Consults   Urology Psychiatry Neurology  DVT Prophylaxis  SCDs  Lab Results  Component Value Date   PLT 272 06/18/2014    Medications  Scheduled Meds: . aspirin EC  325 mg Oral Daily  . cefTRIAXone (ROCEPHIN)  IV  1 g Intravenous Q24H  . donepezil  10 mg Oral QHS  . escitalopram  20 mg Oral Daily  . haloperidol lactate  2.5 mg Intravenous Once  . levothyroxine   150 mcg Oral QAC breakfast  . OLANZapine  7.5 mg Oral BID  . pantoprazole  20 mg Oral q morning - 10a  . sodium chloride  3 mL Intravenous Q12H   Continuous Infusions: . sodium chloride 50 mL/hr at 06/18/14 2325   PRN Meds:.hyoscyamine, menthol-cetylpyridinium, ondansetron **OR** ondansetron (ZOFRAN) IV, opium-belladonna  Antibiotics    Anti-infectives    Start     Dose/Rate Route Frequency Ordered Stop   06/13/14 2000  cefTRIAXone (ROCEPHIN) 1 g in dextrose 5 % 50 mL IVPB - Premix     1 g 100 mL/hr over 30 Minutes Intravenous Every 24 hours 06/12/14 2354     06/12/14 2115  cefTRIAXone (ROCEPHIN) 1 g in dextrose 5 % 50 mL IVPB     1 g 100 mL/hr over 30 Minutes Intravenous  Once 06/12/14 2114 06/12/14 2207        Subjective:   [name] [name] seen and examined today.  Patient currently has no complaints- very sleepy.  Denies chest pain or shortness of breath.  Objective:   Filed Vitals:   06/18/14 0449 06/18/14 1331 06/18/14 2010 06/19/14 0700  BP: 103/50 131/76 127/53 131/91  Pulse: 64 77 67 69  Temp: 97.5 F (36.4 C) 97.8 F (36.6 C) 97.9 F (36.6 C) 97.6 F (36.4 C)  TempSrc: Axillary Oral Oral Axillary  Resp: 20 18 18 20   Height:      Weight: 99.3 kg (218 lb 14.7 oz)   98.6 kg (217 lb 6 oz)  SpO2: 96% 97% 95% 90%    Wt Readings from Last 3 Encounters:  06/19/14 98.6 kg (217 lb 6 oz)  06/05/14 107.049 kg (236 lb)  04/22/14 106.142 kg (234 lb)     Intake/Output Summary (Last 24 hours) at 06/19/14 1141 Last data filed at 06/19/14 0705  Gross per 24 hour  Intake    935 ml  Output   1750 ml  Net   -815 ml    Exam  General: Well developed, NAD  HEENT: NCAT, mucous membranes moist.   Cardiovascular: S1 S2 auscultated, RRR  Respiratory: Clear to auscultation bilaterally with equal chest rise  Abdomen: Soft, nontender, nondistended, + bowel sounds  Extremities: warm dry without cyanosis clubbing or edema- cUrrently in restraints  Neuro: AAOx2.   Nonfocal.  Data Review   Micro Results Recent Results (from the past 240 hour(s))  Urine culture     Status: None   Collection Time: 06/12/14  6:45 PM  Result Value Ref Range Status   Specimen Description URINE, CATHETERIZED  Final   Special Requests NONE  Final   Colony Count NO GROWTH Performed at [facility]   Final   Culture NO GROWTH Performed at [facility]   Final   Report Status 06/15/2014 FINAL  Final  Blood culture (routine x 2)     Status: None   Collection Time: 06/12/14  9:25 PM  Result Value Ref Range Status   Specimen Description BLOOD LEFT ARM  Final   Special Requests BOTTLES DRAWN AEROBIC AND ANAEROBIC 6CC  Final   Culture NO GROWTH 5 DAYS  Final   Report Status 06/17/2014 FINAL  Final  Blood culture (routine x 2)     Status: None   Collection Time: 06/12/14  9:30 PM  Result Value Ref Range Status   Specimen Description BLOOD LEFT HAND  Final   Special Requests BOTTLES DRAWN AEROBIC AND ANAEROBIC 6CC  Final   Culture NO GROWTH 5 DAYS  Final   Report Status 06/17/2014 FINAL  Final  Urine culture     Status: None   Collection Time: 06/13/14  9:50 AM  Result Value Ref Range Status   Specimen Description URINE, CATHETERIZED  Final   Special Requests NONE  Final   Colony Count NO GROWTH Performed at [facility]   Final   Culture NO GROWTH Performed at [facility]   Final   Report Status 06/14/2014 FINAL  Final    Radiology Reports Dg Chest 2 View  06/12/2014   CLINICAL DATA:  Acute onset of altered mental status. Initial encounter.  EXAM: CHEST  2 VIEW  COMPARISON:  Chest radiograph performed 01/28/2014  FINDINGS: The lungs are well-aerated. Mild peribronchial thickening is noted. There is no evidence of focal opacification, pleural effusion or pneumothorax.  The heart is borderline normal in size. The patient is status post median sternotomy. No acute osseous abnormalities are seen.  IMPRESSION: Mild peribronchial  thickening noted; lungs otherwise clear.   Electronically Signed   By: Garald Balding M.D.   On: 06/12/2014 20:41   Ct Head Wo Contrast  06/12/2014   CLINICAL DATA:  Confusion. Hallucination. Recent nephrectomy. Bladder cancer. Mild dementia.  EXAM: CT HEAD WITHOUT CONTRAST  TECHNIQUE: Contiguous axial images were obtained from the base of the skull through the vertex without intravenous contrast.  COMPARISON:  01/23/2014  FINDINGS: Sinuses/Soft tissues: Motion degradation. Mucosal thickening of bilateral maxillary sinuses. Fluid in the right frontal sinus. Clear mastoid air cells.  Intracranial: Mild low density in the periventricular white matter likely related to small vessel disease. Cerebral volume loss which is normal for age. No mass lesion, hemorrhage, hydrocephalus, acute infarct, intra-axial, or extra-axial fluid collection.  IMPRESSION: 1.  No acute intracranial abnormality. 2. Sinus disease. 3. Mild small vessel ischemic change.   Electronically Signed   By: Abigail Miyamoto M.D.   On: 06/12/2014 20:27   Dg Cystogram  06/18/2014   CLINICAL DATA:  Status post right nephroureterectomy with excision of right ureteral orifice, evaluate for leak  EXAM: CYSTOGRAM  TECHNIQUE: After catheterization of the urinary bladder following sterile technique the bladder was filled with 150 mL Cysto-Hypaque 30% by drip infusion. Serial spot images were obtained during bladder filling and post draining.  FLUOROSCOPY TIME:  1 minutes 32 seconds  COMPARISON:  None.  FINDINGS: Surgical clips overlying the right abdomen/ pelvis.  Right lateral bladder is mildly irregular.  No evidence of bladder leak.  Reflux of contrast into the left ureter to the level the left proximal collecting system.  Postvoid/drainage image is within normal limits.  IMPRESSION: Postsurgical changes related to prior right nephroureterectomy.  No evidence of bladder leak.   Electronically Signed   By: Julian Hy M.D.   On: 06/18/2014 15:55   US  Renal  06/13/2014   CLINICAL DATA:  Elevated BUN/creatinine.  EXAM: RENAL/URINARY TRACT ULTRASOUND COMPLETE  COMPARISON:  CT 03/13/2014  FINDINGS: Right Kidney:  Surgically absent  Left Kidney: 12.0 cm. Normal renal cortical thickness for age. No hydronephrosis. Left renal collecting system stone or stones. Most well-defined measures 5 mm in the inter/lower pole region.  Bladder: Per clinical history, the patient has a Foley catheter. It is not visualized in the bladder, and the bladder is not decompressed.  IMPRESSION: 1. Status post right nephrectomy. 2. No evidence of left-sided hydronephrosis. At least 1 left renal collecting system calculus identified. 3. Foley catheter not identified within the urinary bladder. Bladder is not decompressed. Correlate with position and function of Foley catheter. These results will be called to the ordering clinician or representative by the Radiologist Assistant, and communication documented in the PACS or zVision Dashboard.   Electronically Signed   By: Abigail Miyamoto M.D.   On: 06/13/2014 12:29    CBC  Recent Labs Lab 06/12/14 1854 06/14/14 1105 06/15/14 0550 06/18/14 0440  WBC 11.1* 10.5 10.3 10.2  HGB 10.4* 10.4* 9.8* 11.0*  HCT 32.6* 32.2* 31.0* 34.9*  PLT 260 290 261 272  MCV 87.9 88.5 90.1 90.6  MCH 28.0 28.6 28.5 28.6  MCHC 31.9 32.3 31.6 31.5  RDW 15.4 15.6* 16.0* 15.7*  LYMPHSABS 1.7 0.7 1.8  --   MONOABS 0.9 0.4 0.8  --   EOSABS 0.8* 0.0 0.6  --   BASOSABS 0.1 0.0 0.1  --     Chemistries   Recent Labs Lab 06/13/14 0650 06/14/14 1105 06/15/14 0550 06/18/14 0440 06/19/14 0520  NA 138 140 143 140 145  K 3.6 3.4* 3.7 3.3* 3.9  CL 104 108 109 106 107  CO2 30 27 28 29 30   GLUCOSE 93 137* 82 110* 91  BUN 24* 18 19 19 16   CREATININE 2.07* 1.71* 1.82* 1.78* 1.67*  CALCIUM 8.4 8.6 8.6 8.6 9.0  AST 35 38* 35  --   --   ALT 16 18 18   --   --   ALKPHOS 53 49 51  --   --   BILITOT 0.7 0.5 0.5  --   --     ------------------------------------------------------------------------------------------------------------------ estimated creatinine clearance is 42.9 mL/min (by C-G formula based on Cr of 1.67). ------------------------------------------------------------------------------------------------------------------ No results for input(s): HGBA1C in the last 72 hours. ------------------------------------------------------------------------------------------------------------------ No results for input(s): CHOL, HDL, LDLCALC, TRIG, CHOLHDL, LDLDIRECT in the last 72 hours. ------------------------------------------------------------------------------------------------------------------ No results for input(s): TSH, T4TOTAL, T3FREE, THYROIDAB in the last 72 hours.  Invalid input(s): FREET3 ------------------------------------------------------------------------------------------------------------------  Recent Labs  06/17/14 0800  VITAMINB12 867  FOLATE 18.8  FERRITIN 197  TIBC 205*  IRON 18*  RETICCTPCT 1.0    Coagulation profile No results for input(s): INR, PROTIME in the last 168 hours.  No results for input(s): DDIMER in the last 72 hours.  Cardiac Enzymes  Recent Labs Lab 06/13/14 0039 06/13/14 0650 06/13/14 1154  TROPONINI 0.25* 0.23* 0.20*   ------------------------------------------------------------------------------------------------------------------ Invalid input(s): POCBNP    Sharee Sturdy D.O. on 06/19/2014 at 11:41 AM  Between 7am to 7pm - Pager - [phone]  After 7pm go to [email or web address] - password [email or web address]  And look for the night coverage person covering for me after hours  [facility] Office  [phone]

## 2014-06-19 NOTE — Evaluation (Signed)
Clinical/Bedside Swallow Evaluation Patient Details  Name: [name] [name] MRN: [id number] Date of Birth: [birth year]  Today's Date: 06/19/2014 Time: SLP Start Time (ACUTE ONLY): 1421 SLP Stop Time (ACUTE ONLY): 1447 SLP Time Calculation (min) (ACUTE ONLY): 26 min  Past Medical History:  Past Medical History  Diagnosis Date  . Chronic constipation   . GERD (gastroesophageal reflux disease)   . Arthritis   . Hyperlipidemia   . Orthostatic hypotension 11/19/2013  . Coronary artery disease     CARDIOLOGIST-  DR BERRY  --  HX  STEMI  12-17-2013  S/P  CABG X4  12-18-2013  . Mild dementia   . Type 2 diabetes mellitus   . Hypothyroidism   . History of esophageal dilatation   . History of gastritis   . History of hiatal hernia   . History of bladder cancer     hx  TCC low grade  . Prostate cancer     low-grade  . Kidney tumor     right  . BPH (benign prostatic hyperplasia)   . CKD (chronic kidney disease) stage 3, GFR 30-59 ml/min   . History of kidney stones   . ED (erectile dysfunction)   . S/P CABG x 4     12-18-2013  . Bladder tumor   . Complication of anesthesia     HARD TO WAKE  . OSA on CPAP     mild osa   per sleep study 07-11-2009- patient states does not use regularly   Past Surgical History:  Past Surgical History  Procedure Laterality Date  . Upper gastrointestinal endoscopy  04/06/2010  &  01-08-2013    w/ esophageal dilatation  . Hemorrhoid surgery    . Hand surgery      right  . Wrist fracture surgery      pins placed-left  . Cystoscopy with biopsy  01/09/2012    Procedure: CYSTOSCOPY WITH BIOPSY;  Surgeon: Marissa Nestle, MD;  Location: [facility];  Service: Urology;  Laterality: N/A;  Cystoscopy with Multiple Bladder Biopsies  . Esophagogastroduodenoscopy (egd) with esophageal dilation N/A 01/08/2013    Procedure: ESOPHAGOGASTRODUODENOSCOPY (EGD) WITH ESOPHAGEAL DILATION;  Surgeon: Rogene Houston, MD;  Location: [facility];  Service: Endoscopy;   Laterality: N/A;  120  . Left heart cath N/A 12/17/2013    Procedure: LEFT HEART CATH;  Surgeon: Leonie Man, MD;  Location: [facility] CATH LAB;  Service: Cardiovascular;  Laterality: N/A;  . Left heart catheterization with coronary angiogram N/A 12/17/2013    Procedure: LEFT HEART CATHETERIZATION WITH CORONARY ANGIOGRAM;  Surgeon: Leonie Man, MD;  Location: [facility] CATH LAB;  Service: Cardiovascular;  Laterality: N/A;  . Colonoscopy with esophagogastroduodenoscopy (egd)  08-15-2006    w/ esophageal dilatation  . Umbilical hernia repair  04-24-2003  . Transurethral resection of bladder tumor  06-14-2004  . Transurethral resection of prostate  10-04-2004  . Cysto/  removal bladder calculus/  resection bladder tumor  10-16-2006  . Cystostomy w/ bladder biopsy  01-09-2012  . Cardiac catheterization  12-17-2013   dr Shanon Brow harding    severe disease RCA with thrombotic appearing subtotal occlusions/  95-99%  OM2/  diffuse disease LAD  and D1/  mild to moderate basal to mid inferior hypokinesis/  severe elevated LVEDP/  ef 45-50%  . Coronary artery bypass graft N/A 12/18/2013    Procedure: Coronary artery bypass grafting times four using left internal mammary artery and endoscopically harvested right saphenous vein graft;  Surgeon: Gaye Pollack,  MD;  Location: [facility] OR;  Service: Open Heart Surgery;  Laterality: N/A;  . Transthoracic echocardiogram  11-14-2012    grade I diastolic dysfunction/  ef 55-60%/  trivial MR  and TR  . Cardiovascular stress test  08-09-2009  dr berry    mild to moderate perfusion defect in basal , mid, and apical inferior regions consistent with an infart/scar/   No evidence ischemia/  ef 51%/  no ECG changes/   low risk scan  . Transurethral resection of bladder tumor N/A 04/07/2014    Procedure: TRANSURETHRAL RESECTION OF BLADDER TUMOR (TURBT);  Surgeon: Arvil Persons, MD;  Location: [facility];  Service: Urology;  Laterality: N/A;  . Cystoscopy/retrograde/ureteroscopy  Right 04/07/2014    Procedure: CYSTOSCOPY/RETROGRADE/URETEROSCOPY WITH BIOPSY OF RENAL PELVIS TUMOR;  Surgeon: Arvil Persons, MD;  Location: [facility];  Service: Urology;  Laterality: Right;  . Robot assisted laparoscopic nephrectomy Right 06/05/2014    Procedure: ROBOTIC ASSISTED LAPAROSCOPIC RIGHT NEPHROURETERECTOMY, VENTRAL HERNIA REPAIR ;  Surgeon: Ardis Hughs, MD;  Location: [facility];  Service: Urology;  Laterality: Right;  . Cystoscopy w/ ureteral stent placement Right 06/05/2014    Procedure: CYSTOSCOPY ;  Surgeon: Ardis Hughs, MD;  Location: [facility];  Service: Urology;  Laterality: Right;   HPI:  81 yo male h/o mild to mod dementia, s/p cabg end of 2015, s/p nephrectomy, GERD, hiatal hernia, chronic constipation, gastritis, bladder cancer admitted for confusion and agitation today. Pt  d/c from [place] about 4 days ago and per MD note, has not really been eating or drinking well.CT no acute intracranial abnormality. MRI pending. CXR mild peribronchial thickening noted; lungs otherwise clear. Barium esophagram 11/2010 severe diffuse impairment of esophageal motility as above, with prolonged retention of contrast within the thoracic esophagus even on upright imaging   Assessment / Plan / Recommendation Clinical Impression  Pt awake majority of session, decreased attention, appears to be hallucinating and significant confusion. Suspect primarily esophageal dysphagia; SLP detect audible sound post swallow resembling possible backlow throught UES. Pt has history of severe esophageal dysmotility. Swallow initation inconsistently delayed. Requires feeding assist and volume control and max cues throughout assessment. Increased aspiration risk due to confusion and decreased awareness. SLP recommends Dys 1,  SMALL cup sips thin, NO straws, crushed pills and full feeding assist. ST will continue tx.    Aspiration Risk   (mod-severe)    Diet Recommendation Dysphagia 1  (Puree);Thin liquid   Liquid Administration via: No straw;Cup Medication Administration: Crushed with puree Supervision: Staff to assist with self feeding;Full supervision/cueing for compensatory strategies Compensations: Slow rate;Small sips/bites;Check for pocketing Postural Changes and/or Swallow Maneuvers: Seated upright 90 degrees;Upright 30-60 min after meal    Other  Recommendations Oral Care Recommendations: Oral care BID   Follow Up Recommendations   (TBD)    Frequency and Duration min 2x/week  2 weeks   Pertinent Vitals/Pain none         Swallow Study          Oral/Motor/Sensory Function Overall Oral Motor/Sensory Function:  (uncoordinated movements due to confusion, no weakness observ)   Ice Chips Ice chips: Impaired Presentation: Spoon Oral Phase Impairments: Reduced lingual movement/coordination Oral Phase Functional Implications: Prolonged oral transit Pharyngeal Phase Impairments: Suspected delayed Swallow (audible swallow)   Thin Liquid Thin Liquid: Impaired Presentation: Cup;Straw;Spoon Oral Phase Impairments: Reduced lingual movement/coordination;Poor awareness of bolus Oral Phase Functional Implications: Prolonged oral transit Pharyngeal  Phase Impairments: Suspected delayed Swallow (audible swallow, belch)  Nectar Thick Nectar Thick Liquid: Not tested   Honey Thick Honey Thick Liquid: Not tested   Puree Puree: Impaired Presentation: Spoon Pharyngeal Phase Impairments: Suspected delayed Swallow   Solid   GO    Solid: Impaired Oral Phase Impairments: Reduced lingual movement/coordination Pharyngeal Phase Impairments:  (none)       Clement Deneault, Orbie Pyo 06/19/2014,3:18 PM  Orbie Pyo [place] [phone]

## 2014-06-19 NOTE — Progress Notes (Signed)
Pt not wearing cpap during the night.  Has not wore since been here.

## 2014-06-19 NOTE — Consult Note (Signed)
Psychiatry Consult follow-up  Reason for Consult:  Medication management for dementia with agitation and AMS Referring Physician:  Dr. Verlon Au  Patient Identification: [name] [name] MRN:  [id number] Principal Diagnosis: Dementia without behavioral disturbance Diagnosis:   Patient Active Problem List   Diagnosis Date Noted  . Blindness [H54.0]   . NSTEMI (non-ST elevated myocardial infarction) [I21.4]   . Renal insufficiency [N28.9]   . S/p nephrectomy [Z90.5] 06/12/2014  . Acute renal failure [N17.9] 06/12/2014  . UTI (lower urinary tract infection) [N39.0] 06/12/2014  . Metabolic encephalopathy [W09.81] 06/12/2014  . Elevated troponin [R79.89] 06/12/2014  . Altered mental state [R41.82] 06/12/2014  . Altered mental status [R41.82]   . Transitional cell carcinoma of kidney [C64.9] 06/05/2014  . TIA (transient ischemic attack) [G45.9] 01/23/2014  . Dementia without behavioral disturbance [F03.90] 01/23/2014  . Prolonged Q-T interval on ECG [I45.81] 01/23/2014  . DM type 2 (diabetes mellitus, type 2) [E11.9] 01/23/2014  . Anemia, post op [D64.9] 01/13/2014  . CAD, multiple vessel [I25.10] 12/18/2013  . S/P CABG x 4 [Z95.1] 12/18/2013  . ST elevation myocardial infarction (STEMI) of inferior wall, initial episode of care [I21.19] 12/17/2013  . Hypoxemia [R09.02] 11/19/2013  . Orthostatic hypotension [I95.1] 11/19/2013  . Dysphagia, unspecified(787.20) [R13.10] 12/12/2012  . Dizziness [R42] 11/07/2012  . Hyperlipidemia [E78.5] 11/07/2012  . Obstructive sleep apnea [G47.33] 11/07/2012    Total Time spent with patient: 20 minutes  Subjective:   [name] [name] is a 81 y.o. male patient admitted with AMS.  HPI: [name] [name] is a 81 years old male seen today and chart reviewed. Case discussed with the patient youngest son and daughter-in-law who were at bedside and also Dr. Verlon Au.  Patient was not able to contribute to this evaluation secondary to being sedated with medication for  increased agitation and aggressive behavior and also required soft restraints. Reportedly patient has been living by himself and has been assisted by family and also help hired for 24 hours. Patient has been deteriorated since he has been suffering with multiple medical problems including heart attack, CABG, bladder cancer, kidney surgery and recently become psychotic with delusional thinking about his girlfriend has been married someone else, not sleeping well, not eating well and become irritable, agitated and aggressive. Patient has a history of moderate dementia and has been treated with medication from primary care physician over 2 years. Patient has no history of psychiatric treatment. Patient has no history of suicidal or homicidal ideation, intention or plans.  Interval history: Patient has been lying down in his bed and somewhat snoring while sleeping. Patient is easily awakened and verbally responded but his words are slurred. Patient sitter from a home was at bedside reported that patient has been confused since he had surgery in the recent past. Patient has been confused and  not appeared responding to internal stimuli today. Patient has no reported agitation or aggressive behavior. Patient has been compliant with his medication and has poor appetite. Neurology recommended MRI scan of the brain and follow up with him.   Medical history: 59 ? Mod Dementia, OSA/OHSS on CPAP, STEMI s/p cath 12/17/13 + CAGB, Orthostatic hypotension on both Midodrine + florinef. Prior TOb abuse, new onset DM ~12/2013 NOted Qt prolongation 466 this admit, chronic GERD + Solid dysphagia, known bladder Ca + low grade ?  tract tumour R Renal pelvis PR Ca [s/p [id number] for BPH] recently s/p Robotic-assisted Lap Nephroureterectomy + Ventral hernia repair by Dr. Louis Meckel 06/05/14 admitted from [facility] 06/12/14 with toxic  metabolic encephalopathy in the setting of progressive worsening of renal function. Apparently patient had done well  status post surgery and was discharged~2/28 he did well initially but on 3/2 it was noted that he had decreased sleep, increased agitation, and more confusion than usual. He has been diagnosed with dementia~2 years ago and had been placed on Aricept-his family confirm his dementia is mild to moderate- he was still able to cook for himself prior to his CABG and does need some assistance with daily but for the most part is pretty independent. Urology was consulted with regards to his recent surgery as this may been a postoperative complication   Past Medical History:  Past Medical History  Diagnosis Date  . Chronic constipation   . GERD (gastroesophageal reflux disease)   . Arthritis   . Hyperlipidemia   . Orthostatic hypotension 11/19/2013  . Coronary artery disease     CARDIOLOGIST-  DR BERRY  --  HX  STEMI  12-17-2013  S/P  CABG X4  12-18-2013  . Mild dementia   . Type 2 diabetes mellitus   . Hypothyroidism   . History of esophageal dilatation   . History of gastritis   . History of hiatal hernia   . History of bladder cancer     hx  TCC low grade  . Prostate cancer     low-grade  . Kidney tumor     right  . BPH (benign prostatic hyperplasia)   . CKD (chronic kidney disease) stage 3, GFR 30-59 ml/min   . History of kidney stones   . ED (erectile dysfunction)   . S/P CABG x 4     12-18-2013  . Bladder tumor   . Complication of anesthesia     HARD TO WAKE  . OSA on CPAP     mild osa   per sleep study 07-11-2009- patient states does not use regularly    Past Surgical History  Procedure Laterality Date  . Upper gastrointestinal endoscopy  04/06/2010  &  01-08-2013    w/ esophageal dilatation  . Hemorrhoid surgery    . Hand surgery      right  . Wrist fracture surgery      pins placed-left  . Cystoscopy with biopsy  01/09/2012    Procedure: CYSTOSCOPY WITH BIOPSY;  Surgeon: Marissa Nestle, MD;  Location: [facility];  Service: Urology;  Laterality: N/A;  Cystoscopy with  Multiple Bladder Biopsies  . Esophagogastroduodenoscopy (egd) with esophageal dilation N/A 01/08/2013    Procedure: ESOPHAGOGASTRODUODENOSCOPY (EGD) WITH ESOPHAGEAL DILATION;  Surgeon: Rogene Houston, MD;  [facility]: [facility];  Service: Endoscopy;  Laterality: N/A;  120  . Left heart cath N/A 12/17/2013    Procedure: LEFT HEART CATH;  Surgeon: Leonie Man, MD;  Location: [facility] CATH LAB;  Service: Cardiovascular;  Laterality: N/A;  . Left heart catheterization with coronary angiogram N/A 12/17/2013    Procedure: LEFT HEART CATHETERIZATION WITH CORONARY ANGIOGRAM;  Surgeon: Leonie Man, MD;  Location: [facility] CATH LAB;  Service: Cardiovascular;  Laterality: N/A;  . Colonoscopy with esophagogastroduodenoscopy (egd)  08-15-2006    w/ esophageal dilatation  . Umbilical hernia repair  04-24-2003  . Transurethral resection of bladder tumor  06-14-2004  . Transurethral resection of prostate  10-04-2004  . Cysto/  removal bladder calculus/  resection bladder tumor  10-16-2006  . Cystostomy w/ bladder biopsy  01-09-2012  . Cardiac catheterization  12-17-2013   dr Shanon Brow harding    severe disease RCA  with thrombotic appearing subtotal occlusions/  95-99%  OM2/  diffuse disease LAD  and D1/  mild to moderate basal to mid inferior hypokinesis/  severe elevated LVEDP/  ef 45-50%  . Coronary artery bypass graft N/A 12/18/2013    Procedure: Coronary artery bypass grafting times four using left internal mammary artery and endoscopically harvested right saphenous vein graft;  Surgeon: Gaye Pollack, MD;  Location: [facility] OR;  Service: Open Heart Surgery;  Laterality: N/A;  . Transthoracic echocardiogram  11-14-2012    grade I diastolic dysfunction/  ef 55-60%/  trivial MR  and TR  . Cardiovascular stress test  08-09-2009  dr berry    mild to moderate perfusion defect in basal , mid, and apical inferior regions consistent with an infart/scar/   No evidence ischemia/  ef 51%/  no ECG changes/   low risk scan  .  Transurethral resection of bladder tumor N/A 04/07/2014    Procedure: TRANSURETHRAL RESECTION OF BLADDER TUMOR (TURBT);  Surgeon: Arvil Persons, MD;  Location: [facility];  Service: Urology;  Laterality: N/A;  . Cystoscopy/retrograde/ureteroscopy Right 04/07/2014    Procedure: CYSTOSCOPY/RETROGRADE/URETEROSCOPY WITH BIOPSY OF RENAL PELVIS TUMOR;  Surgeon: Arvil Persons, MD;  Location: [facility];  Service: Urology;  Laterality: Right;  . Robot assisted laparoscopic nephrectomy Right 06/05/2014    Procedure: ROBOTIC ASSISTED LAPAROSCOPIC RIGHT NEPHROURETERECTOMY, VENTRAL HERNIA REPAIR ;  Surgeon: Ardis Hughs, MD;  Location: [facility];  Service: Urology;  Laterality: Right;  . Cystoscopy w/ ureteral stent placement Right 06/05/2014    Procedure: CYSTOSCOPY ;  Surgeon: Ardis Hughs, MD;  Location: [facility];  Service: Urology;  Laterality: Right;   Family History:  Family History  Problem Relation Age of Onset  . Cancer - Lung Brother    Social History:  History  Alcohol Use No     History  Drug Use No    History   Social History  . Marital Status: Divorced    Spouse Name: N/A  . Number of Children: 3  . Years of Education: HS   Occupational History  .     Social History Main Topics  . Smoking status: Former Smoker -- 1.00 packs/day for 4 years    Types: Cigarettes  . Smokeless tobacco: Former Systems developer    Types: [place] date: 01/03/1960  . Alcohol Use: No  . Drug Use: No  . Sexual Activity: Yes    Birth Control/ Protection: None   Other Topics Concern  . None   Social History Narrative   Patient is single and lives alone.   Patient is retired.   Patient has a high school education.   Patient is right-handed.   Patient drinks one cup of coffee and one cup of soda or tea daily.   Patient has three adult children.    Allergies:   Allergies  Allergen Reactions  . Lorazepam     Hallucinations, worsens agitation  . Statins Other  (See Comments)    MUSCLE ACHES    Vitals: Blood pressure 131/91, pulse 69, temperature 97.6 F (36.4 C), temperature source Axillary, resp. rate 20, height 5\' 10"  (1.778 m), weight 98.6 kg (217 lb 6 oz), SpO2 90 %.  Risk to Self: Is patient at risk for suicide?: No Risk to Others:   Prior Inpatient Therapy:   Prior Outpatient Therapy:    Current Facility-Administered Medications  Medication Dose Route Frequency Provider Last Rate Last Dose  .  0.9 %  sodium chloride infusion   Intravenous Continuous Maryann Mikhail, DO 50 mL/hr at 06/18/14 2325    . aspirin EC tablet 325 mg  325 mg Oral Daily Phillips Grout, MD   325 mg at 06/18/14 1100  . cefTRIAXone (ROCEPHIN) 1 g in dextrose 5 % 50 mL IVPB - Premix  1 g Intravenous Q24H Phillips Grout, MD   1 g at 06/18/14 2105  . donepezil (ARICEPT) tablet 10 mg  10 mg Oral QHS Nita Sells, MD   10 mg at 06/18/14 2123  . escitalopram (LEXAPRO) tablet 20 mg  20 mg Oral Daily Nita Sells, MD   20 mg at 06/18/14 1100  . haloperidol lactate (HALDOL) injection 2.5 mg  2.5 mg Intravenous Once Jeryl Columbia, NP   2.5 mg at 06/19/14 0500  . hyoscyamine (LEVSIN SL) SL tablet 0.125 mg  0.125 mg Sublingual Q6H PRN Irine Seal, MD   0.125 mg at 06/17/14 1548  . levothyroxine (SYNTHROID, LEVOTHROID) tablet 150 mcg  150 mcg Oral QAC breakfast Nita Sells, MD   150 mcg at 06/18/14 1100  . menthol-cetylpyridinium (CEPACOL) lozenge 3 mg  1 lozenge Oral PRN Rhetta Mura Schorr, NP   3 mg at 06/18/14 0410  . OLANZapine (ZYPREXA) tablet 7.5 mg  7.5 mg Oral BID Ambrose Finland, MD   7.5 mg at 06/18/14 2123  . ondansetron (ZOFRAN) tablet 4 mg  4 mg Oral Q6H PRN Phillips Grout, MD       Or  . ondansetron [facility]) injection 4 mg  4 mg Intravenous Q6H PRN Phillips Grout, MD      . opium-belladonna (B&O SUPPRETTES) 16.2-60 MG suppository 1 suppository  1 suppository Rectal Q6H PRN Cristal Ford, DO   1 suppository at 06/18/14 2128  .  pantoprazole (PROTONIX) EC tablet 20 mg  20 mg Oral q morning - 10a Nita Sells, MD   20 mg at 06/18/14 1049  . sodium chloride 0.9 % injection 3 mL  3 mL Intravenous Q12H Phillips Grout, MD   3 mL at 06/18/14 2105    Musculoskeletal: Strength & Muscle Tone: decreased Gait & Station: unable to stand Patient leans: N/A  Psychiatric Specialty Exam: Physical Exam as per history and physical   ROS agitation, aggression, cognitive deficits   Blood pressure 131/91, pulse 69, temperature 97.6 F (36.4 C), temperature source Axillary, resp. rate 20, height 5\' 10"  (1.778 m), weight 98.6 kg (217 lb 6 oz), SpO2 90 %.Body mass index is 31.19 kg/(m^2).  General Appearance: Disheveled and Guarded  Eye Contact::  Minimal  Speech:  Slow and Slurred  Volume:  Decreased  Mood:  Anxious  Affect:  Non-Congruent and Inappropriate  Thought Process:  Disorganized and Irrelevant  Orientation:  Negative  Thought Content:  Delusions and Rumination, responding to internal stimuli   Suicidal Thoughts:  No  Homicidal Thoughts:  No  Memory:  Immediate;   Poor Recent;   Poor  Judgement:  Impaired  Insight:  Lacking  Psychomotor Activity:  Restlessness  Concentration:  Poor  Recall:  Poor  Fund of Knowledge:NA  Language: Poor  Akathisia:  Negative  Handed:  Right  AIMS (if indicated):     Assets:  Financial Resources/Insurance Housing Intimacy Leisure Time Social Support  ADL's:  Impaired  Cognition: Impaired,  Moderate  Sleep:      Medical Decision Making: New problem, with additional work up planned, Review of Psycho-Social Stressors (1), Review or order clinical lab tests (1),  Discuss test with performing physician (1), Established Problem, Worsening (2), Review or order medicine tests (1), Review of Medication Regimen & Side Effects (2) and Review of New Medication or Change in Dosage (2)  Treatment Plan Summary: Daily contact with patient to assess and evaluate symptoms and progress in  treatment and Medication management  Plan:  Continue Zyprexa 7.5 mg twice daily and will ask the weekend psychiatric consultation to follow-up  Patient does not meet criteria for psychiatric inpatient admission. Supportive therapy provided about ongoing stressors.  Appreciate psychiatric consultation and follow up as clinically required Please contact [phone] or [phone] if needs further assistance  Disposition: Patient may benefit from SNP placement when medically stable.  Mona Ayars,JANARDHAHA R. 06/19/2014 10:26 AM

## 2014-06-19 NOTE — Progress Notes (Signed)
ID: [id number] POD#14 from robotic assisted right nephro-ureterectomy, readmitted with encephalopathy.  Intv: Sundowning behavior? Overnight, somnulent this AM.  In retraints.  Cystogram demonstrated no leak.   PE: AFVSS  Intake/Output Summary (Last 24 hours) at 06/19/14 1211 Last data filed at 06/19/14 0705  Gross per 24 hour  Intake    935 ml  Output   1750 ml  Net   -815 ml  abdomen is soft, incisions are intact without surrounding erythema Foley is draining clear yellow urine.   Recent Labs  06/18/14 0440  WBC 10.2  HGB 11.0*  HCT 34.9*    Recent Labs  06/18/14 0440 06/19/14 0520  NA 140 145  K 3.3* 3.9  CL 106 107  CO2 29 30  GLUCOSE 110* 91  BUN 19 16  CREATININE 1.78* 1.67*  CALCIUM 8.6 9.0   No results for input(s): LABPT, INR in the last 72 hours. No results for input(s): LABURIN in the last 72 hours. Results for orders placed or performed during the hospital encounter of 06/12/14  Urine culture     Status: None   Collection Time: 06/12/14  6:45 PM  Result Value Ref Range Status   Specimen Description URINE, CATHETERIZED  Final   Special Requests NONE  Final   Colony Count NO GROWTH Performed at [facility]   Final   Culture NO GROWTH Performed at [facility]   Final   Report Status 06/15/2014 FINAL  Final  Blood culture (routine x 2)     Status: None   Collection Time: 06/12/14  9:25 PM  Result Value Ref Range Status   Specimen Description BLOOD LEFT ARM  Final   Special Requests BOTTLES DRAWN AEROBIC AND ANAEROBIC 6CC  Final   Culture NO GROWTH 5 DAYS  Final   Report Status 06/17/2014 FINAL  Final  Blood culture (routine x 2)     Status: None   Collection Time: 06/12/14  9:30 PM  Result Value Ref Range Status   Specimen Description BLOOD LEFT HAND  Final   Special Requests BOTTLES DRAWN AEROBIC AND ANAEROBIC 6CC  Final   Culture NO GROWTH 5 DAYS  Final   Report Status 06/17/2014 FINAL  Final  Urine culture     Status: None   Collection Time: 06/13/14  9:50 AM  Result Value Ref Range Status   Specimen Description URINE, CATHETERIZED  Final   Special Requests NONE  Final   Colony Count NO GROWTH Performed at [facility]   Final   Culture NO GROWTH Performed at [facility]   Final   Report Status 06/14/2014 FINAL  Final     Imp: s/p R RAL nephro-ureterectomy, readmitted with encephalopathy.   Rise in creatinine expected with removal of 1/2 his GFR. Rec: Would do voiding trial if patient able to get to bathroom.  If he can't participate, wouldn't take out foley, not sure condom catheter is better alternative.   D/c staples. Will continue to follow.

## 2014-06-19 NOTE — Progress Notes (Signed)
Subjective:   Objective: Current vital signs: BP 131/91 mmHg  Pulse 69  Temp(Src) 97.6 F (36.4 C) (Axillary)  Resp 20  Ht 5\' 10"  (1.778 m)  Wt 98.6 kg (217 lb 6 oz)  BMI 31.19 kg/m2  SpO2 90% Vital signs in last 24 hours: Temp:  [97.6 F (36.4 C)-97.9 F (36.6 C)] 97.6 F (36.4 C) (03/11 0700) Pulse Rate:  [67-77] 69 (03/11 0700) Resp:  [18-20] 20 (03/11 0700) BP: (127-131)/(53-91) 131/91 mmHg (03/11 0700) SpO2:  [90 %-97 %] 90 % (03/11 0700) Weight:  [98.6 kg (217 lb 6 oz)] 98.6 kg (217 lb 6 oz) (03/11 0700)  Intake/Output from previous day: 03/10 0701 - 03/11 0700 In: 1175 [P.O.:600; I.V.:525; IV Piggyback:50] Out: 1250 [Urine:1250] Intake/Output this shift: Total I/O In: -  Out: 500 [Urine:500] Nutritional status: Diet full liquid  Neurologic Exam: Mental Status: Awake and alert. Follows simple commands.agitated when stimulated. Able to state he is in hospital but belives it is December and cannot tell me the year.  Cranial Nerves: II: Discs flat bilaterally; Blinks to bilateral confrontation and able to count fingers in all fields.  Pupils equal, round, reactive to light and accommodation III,IV, VI: ptosis not present, extra-ocular motions intact bilaterally V,VII: decrease in the right NLF, facial light touch sensation normal bilaterally VIII: hearing normal bilaterally IX,X: gag reflex present XI: bilateral shoulder shrug XII: unable to test Motor: Right :Upper extremity 5/5Left: Upper extremity 5/5 Lower extremity 5/5Lower extremity 5/5 Tone and bulk:normal tone throughout; no atrophy noted Sensory: Responds to tactile stimuli in all extremities Deep Tendon Reflexes: 2+ and symmetric throughout Plantars: Right: downgoingLeft: downgoing  Lab Results: Basic Metabolic Panel:  Recent Labs Lab  06/13/14 0650 06/14/14 1105 06/15/14 0550 06/18/14 0440 06/19/14 0520  NA 138 140 143 140 145  K 3.6 3.4* 3.7 3.3* 3.9  CL 104 108 109 106 107  CO2 30 27 28 29 30   GLUCOSE 93 137* 82 110* 91  BUN 24* 18 19 19 16   CREATININE 2.07* 1.71* 1.82* 1.78* 1.67*  CALCIUM 8.4 8.6 8.6 8.6 9.0    Liver Function Tests:  Recent Labs Lab 06/13/14 0650 06/14/14 1105 06/15/14 0550  AST 35 38* 35  ALT 16 18 18   ALKPHOS 53 49 51  BILITOT 0.7 0.5 0.5  PROT 6.2 5.9* 5.6*  ALBUMIN 3.2* 3.1* 2.7*   No results for input(s): LIPASE, AMYLASE in the last 168 hours.  Recent Labs Lab 06/13/14 1150  AMMONIA 12    CBC:  Recent Labs Lab 06/12/14 1854 06/14/14 1105 06/15/14 0550 06/18/14 0440  WBC 11.1* 10.5 10.3 10.2  NEUTROABS 7.6 9.4* 7.0  --   HGB 10.4* 10.4* 9.8* 11.0*  HCT 32.6* 32.2* 31.0* 34.9*  MCV 87.9 88.5 90.1 90.6  PLT 260 290 261 272    Cardiac Enzymes:  Recent Labs Lab 06/12/14 1854 06/12/14 2125 06/13/14 0039 06/13/14 0650 06/13/14 1154  TROPONINI 0.31* 0.27* 0.25* 0.23* 0.20*    Lipid Panel: No results for input(s): CHOL, TRIG, HDL, CHOLHDL, VLDL, LDLCALC in the last 168 hours.  CBG:  Recent Labs Lab 06/13/14 0051 06/13/14 0739  GLUCAP 93 16    Microbiology: Results for orders placed or performed during the hospital encounter of 06/12/14  Urine culture     Status: None   Collection Time: 06/12/14  6:45 PM  Result Value Ref Range Status   Specimen Description URINE, CATHETERIZED  Final   Special Requests NONE  Final   Colony Count NO GROWTH Performed  at [facility]   Final   Culture NO GROWTH Performed at [facility]   Final   Report Status 06/15/2014 FINAL  Final  Blood culture (routine x 2)     Status: None   Collection Time: 06/12/14  9:25 PM  Result Value Ref Range Status   Specimen Description BLOOD LEFT ARM  Final   Special Requests BOTTLES DRAWN AEROBIC AND ANAEROBIC 6CC  Final   Culture NO GROWTH 5 DAYS  Final    Report Status 06/17/2014 FINAL  Final  Blood culture (routine x 2)     Status: None   Collection Time: 06/12/14  9:30 PM  Result Value Ref Range Status   Specimen Description BLOOD LEFT HAND  Final   Special Requests BOTTLES DRAWN AEROBIC AND ANAEROBIC 6CC  Final   Culture NO GROWTH 5 DAYS  Final   Report Status 06/17/2014 FINAL  Final  Urine culture     Status: None   Collection Time: 06/13/14  9:50 AM  Result Value Ref Range Status   Specimen Description URINE, CATHETERIZED  Final   Special Requests NONE  Final   Colony Count NO GROWTH Performed at [facility]   Final   Culture NO GROWTH Performed at [facility]   Final   Report Status 06/14/2014 FINAL  Final    Coagulation Studies: No results for input(s): LABPROT, INR in the last 72 hours.  Imaging: Dg Cystogram  06/18/2014   CLINICAL DATA:  Status post right nephroureterectomy with excision of right ureteral orifice, evaluate for leak  EXAM: CYSTOGRAM  TECHNIQUE: After catheterization of the urinary bladder following sterile technique the bladder was filled with 150 mL Cysto-Hypaque 30% by drip infusion. Serial spot images were obtained during bladder filling and post draining.  FLUOROSCOPY TIME:  1 minutes 32 seconds  COMPARISON:  None.  FINDINGS: Surgical clips overlying the right abdomen/ pelvis.  Right lateral bladder is mildly irregular.  No evidence of bladder leak.  Reflux of contrast into the left ureter to the level the left proximal collecting system.  Postvoid/drainage image is within normal limits.  IMPRESSION: Postsurgical changes related to prior right nephroureterectomy.  No evidence of bladder leak.   Electronically Signed   By: Julian Hy M.D.   On: 06/18/2014 15:55    Medications:  Scheduled: . aspirin EC  325 mg Oral Daily  . cefTRIAXone (ROCEPHIN)  IV  1 g Intravenous Q24H  . donepezil  10 mg Oral QHS  . escitalopram  20 mg Oral Daily  . haloperidol lactate  2.5 mg Intravenous  Once  . levothyroxine  150 mcg Oral QAC breakfast  . OLANZapine  7.5 mg Oral BID  . pantoprazole  20 mg Oral q morning - 10a  . sodium chloride  3 mL Intravenous Q12H    Assessment/Plan:  Patient again confused and agitated over night requiring restraints.  Today able to count fingers in all fields.  Lab work reasonably stable. Patient remains on ASA. Etiology of waxing and waning vision unclear.   Family initially told primary team they did not want MRI.  After discussing with daughter in law the concern for his inability to see, she is going to talk to his son and if he would like to have MRI done we will proceed with MRI.   Recommendations: 1. MRI of the brain without contrast  Neurology will follow up results of imaging and make further recommendations if necessary through contact with attending. If there  are any questions in the interim, the [facility] as listed on [facility] may be called.     Etta Quill PA-C [facility] [phone]  06/19/2014, 9:50 AM

## 2014-06-19 NOTE — Progress Notes (Signed)
CSW received consult for SNF placement, spoke with patient's son Marshell Levan (ph#: [id number]) re: SNF placement. Son states that he had been to [facility] back in September & would like for him to go there at discharge. CSW left messages for [place] [facility], awaiting call back re: bed offer.   Clinical Social Work Department CLINICAL SOCIAL WORK PLACEMENT NOTE 06/19/2014  Patient:  [name] [name],[name] [name]  Account Number:  [id number] Admit date:  06/12/2014  Clinical Social Worker:  Renold Genta  Date/time:  06/19/2014 12:09 PM  Clinical Social Work is seeking post-discharge placement for this patient at the following level of care:   SKILLED NURSING   (*CSW will update this form in Epic as items are completed)   06/19/2014  Patient/family provided with [place] Department of Clinical Social Work's list of facilities offering this level of care within the geographic area requested by the patient (or if unable, by the patient's family).  06/19/2014  Patient/family informed of their freedom to choose among providers that offer the needed level of care, that participate in Medicare, Medicaid or managed care program needed by the patient, have an available bed and are willing to accept the patient.  06/19/2014  Patient/family informed of [facility]' ownership interest in [facility], as well as of the fact that they are under no obligation to receive care at this facility.  PASARR submitted to EDS on 06/19/2014 PASARR number received on 06/19/2014  FL2 transmitted to all facilities in geographic area requested by pt/family on  06/19/2014 FL2 transmitted to all facilities within larger geographic area on   Patient informed that his/her managed care company has contracts with or will negotiate with  certain facilities, including the following:     Patient/family informed of bed offers received:   Patient chooses bed at  Physician recommends and patient chooses bed at     Patient to be transferred to  on   Patient to be transferred to facility by  Patient and family notified of transfer on  Name of family member notified:    The following physician request were entered in Epic:   Additional Comments:      Raynaldo Opitz, [place] Social Worker cell #: [phone]

## 2014-06-20 DIAGNOSIS — Z515 Encounter for palliative care: Secondary | ICD-10-CM | POA: Insufficient documentation

## 2014-06-20 LAB — BASIC METABOLIC PANEL
Anion gap: 9 (ref 5–15)
BUN: 13 mg/dL (ref 6–23)
CALCIUM: 9.3 mg/dL (ref 8.4–10.5)
CO2: 27 mmol/L (ref 19–32)
Chloride: 107 mmol/L (ref 96–112)
Creatinine, Ser: 1.68 mg/dL — ABNORMAL HIGH (ref 0.50–1.35)
GFR calc Af Amer: 43 mL/min — ABNORMAL LOW (ref >90)
GFR, EST NON AFRICAN AMERICAN: 37 mL/min — AB (ref >90)
Glucose, Bld: 84 mg/dL (ref 70–99)
Potassium: 4.1 mmol/L (ref 3.5–5.1)
SODIUM: 143 mmol/L (ref 135–145)

## 2014-06-20 LAB — CBC
HEMATOCRIT: 33.5 % — AB (ref 39.0–52.0)
HEMOGLOBIN: 10.7 g/dL — AB (ref 13.0–17.0)
MCH: 28.8 pg (ref 26.0–34.0)
MCHC: 31.9 g/dL (ref 30.0–36.0)
MCV: 90.3 fL (ref 78.0–100.0)
Platelets: 309 10*3/uL (ref 150–400)
RBC: 3.71 MIL/uL — ABNORMAL LOW (ref 4.22–5.81)
RDW: 15.6 % — AB (ref 11.5–15.5)
WBC: 8.3 10*3/uL (ref 4.0–10.5)

## 2014-06-20 MED ORDER — HALOPERIDOL LACTATE 5 MG/ML IJ SOLN
1.0000 mg | Freq: Once | INTRAMUSCULAR | Status: AC
  Administered 2014-06-20: 1 mg via INTRAMUSCULAR
  Filled 2014-06-20: qty 1

## 2014-06-20 MED ORDER — FENTANYL CITRATE 0.05 MG/ML IJ SOLN
INTRAMUSCULAR | Status: AC
  Filled 2014-06-20: qty 2

## 2014-06-20 MED ORDER — FENTANYL CITRATE 0.05 MG/ML IJ SOLN: 25.0000 ug | Freq: Once | INTRAMUSCULAR | Status: DC

## 2014-06-20 MED ORDER — BISACODYL 10 MG RE SUPP: 10.0000 mg | Freq: Every day | RECTAL | Status: DC | PRN

## 2014-06-20 MED ORDER — HALOPERIDOL 2 MG PO TABS
2.0000 mg | ORAL_TABLET | Freq: Four times a day (QID) | ORAL | Status: DC | PRN
  Filled 2014-06-20: qty 1

## 2014-06-20 MED ORDER — ZIPRASIDONE HCL 20 MG PO CAPS
20.0000 mg | ORAL_CAPSULE | Freq: Two times a day (BID) | ORAL | Status: DC
  Administered 2014-06-20 – 2014-06-22 (×4): 20 mg via ORAL
  Filled 2014-06-20 (×6): qty 1

## 2014-06-20 MED ORDER — SENNOSIDES-DOCUSATE SODIUM 8.6-50 MG PO TABS
2.0000 | ORAL_TABLET | Freq: Every day | ORAL | Status: DC
  Administered 2014-06-21: 2 via ORAL
  Filled 2014-06-20 (×2): qty 2

## 2014-06-20 MED ORDER — FENTANYL CITRATE 0.05 MG/ML IJ SOLN
25.0000 ug | Freq: Once | INTRAMUSCULAR | Status: AC
  Administered 2014-06-20: 25 ug via INTRAMUSCULAR

## 2014-06-20 MED ORDER — ACETAMINOPHEN 650 MG RE SUPP
650.0000 mg | Freq: Four times a day (QID) | RECTAL | Status: DC | PRN
  Filled 2014-06-20: qty 1

## 2014-06-20 MED ORDER — FENTANYL CITRATE 0.05 MG/ML IJ SOLN: 12.5000 ug | INTRAMUSCULAR | Status: DC | PRN

## 2014-06-20 MED ORDER — ACETAMINOPHEN 500 MG PO TABS
1000.0000 mg | ORAL_TABLET | Freq: Three times a day (TID) | ORAL | Status: DC
  Administered 2014-06-20 – 2014-06-22 (×5): 1000 mg via ORAL
  Filled 2014-06-20 (×6): qty 2

## 2014-06-20 NOTE — Progress Notes (Addendum)
Palliative Medicine Team Consult Note  [name] [name] is a 81 yo man who has agitated delirium/ refractory post-op delirium S/p laparoscopic nephroureterectomy for transitional carcinoma on 2/25.  Mr. Friedt has known baseline vascular dementia.He is high risk for post operative cognitive decline a rapid and irreversible form of post-anesthesia complications in vascular dementia patients. They are typically refractory to benzodiazapines except in very high doses. This gentleman is now HOD#8, minimal improvement- post op >3 weeks- I found him extremely agitated, hallucinating in the nurses station -4 point restraints continue to be utilized. He is rigid and having what appear to be some intermittent tremors. Private sitter at bedside.   Assessment:  Complicated sub acute delirium, I suspect this is from untreated pain, constipation and possible long term complications from anesthesia in the setting of baseline vascular dementia.    Recommendations: 1. Scheduled Tylenol TID, IV fentanyl low dose- I place him on a low dose Duragesic patch 2. Geodon for severe agitation 3. D/C haldol- use only one antipsychotic 4. Manually check for impaction and I have written for a bowel regimen. 5. Rule out all possible causes for underlying infection.   Need to have a goals of care conversations with family to determine what his baseline functional status was like prior to admission and make sure they understand the possible disease trajectories-including this may even become a terminal decline if his agitation persists to this degree.  Focus today on symptom management- will try to coordinate with his family to have full goals of care discussion.  Time: 35 minutes  Lane Hacker, [place]

## 2014-06-20 NOTE — Progress Notes (Signed)
14:30-1800........Marland KitchenPt back in bed. One time dose of Fentanyl given IM. Pt has pulled IV access out. Checked for impaction. No impaction felt during digital exam. We were able to get IV acces again. Agitation had some what decreased until the IV insertion. Once IV inserted. Pt calmed back down. Decrease in intermit tremors. Will cont to monitor patient.

## 2014-06-20 NOTE — Progress Notes (Signed)
Family member at bedside states pt has not been wearing CPAP. Pt appears to be very agitated at this time. RT and family member at the bedside believes wearing CPAP will increase his agitation. RT will continue to monitor as needed.

## 2014-06-20 NOTE — Progress Notes (Signed)
i just spent 45 minutes working with [name] [name].  When I first arrived he was not engaging/making eye contact or really interactive.  After spending time with him, asking him to participate in simple tasks and constantly re-orienting him his demeanor changed and he became more alert/oriented.  I put his bed in a chair position and unleashed his arms.  He did very well.  I am encouraged by this.  I have asked the nursing staff to give him simple games/tasks to keep him engaged.  This may bring him out of his delirium.  Otherwise -  Intv: foley removed - now incontinent, condom catheter was pulled off by patient.  Staples have been removed. IV was lost.    Objective: Filed Vitals:   06/19/14 0700 06/19/14 1314 06/19/14 2147 06/20/14 0513  BP: 131/91 125/76 126/56 134/86  Pulse: 69 73 69 71  Temp: 97.6 F (36.4 C) 98 F (36.7 C) 98.1 F (36.7 C) 97.7 F (36.5 C)  TempSrc: Axillary Oral Axillary Oral  Resp: 20 18 18 20   Height:      Weight: 98.6 kg (217 lb 6 oz)   98.2 kg (216 lb 7.9 oz)  SpO2: 90% 95% 100% 96%   Abdomen is soft Incisions are steri-stripped, intact Extremities are symmetric   Recent Labs  06/18/14 0440 06/20/14 0520  WBC 10.2 8.3  HGB 11.0* 10.7*  HCT 34.9* 33.5*    Recent Labs  06/18/14 0440 06/19/14 0520 06/20/14 0520  NA 140 145 143  K 3.3* 3.9 4.1  CL 106 107 107  CO2 29 30 27   GLUCOSE 110* 91 84  BUN 19 16 13   CREATININE 1.78* 1.67* 1.68*  CALCIUM 8.6 9.0 9.3   No results for input(s): LABPT, INR in the last 72 hours. No results for input(s): LABURIN in the last 72 hours. Results for orders placed or performed during the hospital encounter of 06/12/14  Urine culture     Status: None   Collection Time: 06/12/14  6:45 PM  Result Value Ref Range Status   Specimen Description URINE, CATHETERIZED  Final   Special Requests NONE  Final   Colony Count NO GROWTH Performed at [facility]   Final   Culture NO GROWTH Performed at [facility]   Final   Report Status 06/15/2014 FINAL  Final  Blood culture (routine x 2)     Status: None   Collection Time: 06/12/14  9:25 PM  Result Value Ref Range Status   Specimen Description BLOOD LEFT ARM  Final   Special Requests BOTTLES DRAWN AEROBIC AND ANAEROBIC 6CC  Final   Culture NO GROWTH 5 DAYS  Final   Report Status 06/17/2014 FINAL  Final  Blood culture (routine x 2)     Status: None   Collection Time: 06/12/14  9:30 PM  Result Value Ref Range Status   Specimen Description BLOOD LEFT HAND  Final   Special Requests BOTTLES DRAWN AEROBIC AND ANAEROBIC 6CC  Final   Culture NO GROWTH 5 DAYS  Final   Report Status 06/17/2014 FINAL  Final  Urine culture     Status: None   Collection Time: 06/13/14  9:50 AM  Result Value Ref Range Status   Specimen Description URINE, CATHETERIZED  Final   Special Requests NONE  Final   Colony Count NO GROWTH Performed at [facility]   Final   Culture NO GROWTH Performed at [facility]   Final   Report Status 06/14/2014  FINAL  Final     Imp: Delirium ontop of baseline mild dementia  Rec: Persistent engagement by nursing and staff OT consult? I will continue to follow.

## 2014-06-20 NOTE — Progress Notes (Addendum)
[facility]                                                                              Patient Demographics  [name] [name], is a 81 y.o. male, DOB - [birth year], GEZ:[id number]  Admit date - 06/12/2014   Admitting Physician Theressa Millard, MD  Outpatient Primary MD for the patient is Purvis Kilts, MD  LOS - 8   Chief Complaint  Patient presents with  . Altered Mental Status      HPI on 06/12/2014 by Dr. Derrill Kay 81 yo male h/o mild to mod dementia, s/p cabg end of 2015, s/p nephrectomy for TCC last week at [facility], ckd comes in with family for confusion and agitation today. Was d/c from [place] about 4 days ago. Has not really been eating or drinking well. Not been complaining of any pain but has not been sleeping well either. No fevers. No n/v/d. Has foley cath in since surgery, family thought there may be something wrong with it so they changed it today, and has been having good uop. He has fallen several times since hes been home and is generally getting weaker. He was on abx perioperatively but not sent home on any as his cultures were all negative. He had most recent urine culture this past Monday which family reports was negative. No focal neurological deficits. No swelling in his legs. Still has staples to surgical sites on abdomen which have been well healing, no discharge, no other rashes. Asked to admit for his delirium/encephalopathy. No coughing. No sob.  Assessment & Plan  Toxic Metabolic Encephalopathy with Delirium -Waxes and wanes -Patient currently in restraints and was given haldol overnight -Delirium worsens at night -Likely complicated by polypharmacy vs hypoxia vs insomnia -Status post right laparoscopic nephroureterectomy and cystoscopy -Ammonia 12, TSH 13.484 -Urine cultures were negative, blood cultures negative -Psychiatry consulted -Ceftriaxone discontinued  -Neurology consulted and appreciated- it seems upon evaluation  3/10, patient was having visual problems, however this seems to have subsided.  MRI of the brain was ordered, cancelled by one son, and wanted by another.  Patient's family understand that due to patient's inability to stay still, MRI would have to be done under general anesthesia.  Sons agreed.  Tentative date 06/22/2014.  Please note, haldol will not sedate patient enough to obtain adequate study, and patient is intolerant to Ativan (Discussed with neurology).  Demand ischemia with elevated troponin -Troponin trending downward, likely secondary to the above, no further workup  History of CABG -On 12/17/2013 -Currently not on beta blocker due to orthostasis, no ACE inhibitor due to renal failure -Continue aspirin 325 mg daily  Hypothyroidism -TSH 13.484, continue synthroid  Polypharmacy -Psychiatry consulted- patient does not need inpatient psych -Psychiatry titrating meds, Zyprexa 7.5mg  BID, lexapro  Acute renal insufficiency with mild hypokalemia -Upon admission, creatinine was 2.15 -Baseline 1.2, currently 1.68 -Continue to monitor BMP  Orthostatic hypotension -Midodrine and Florinef held- due to soft pressures  Obstructive sleep apnea -Unable to tolerate C-peptide night due to confusion  Transitional cell carcinoma -S/p laparoscopic nephroureterectomy -Urology following appreciated -Cystogram 3/10- no bladder leak -foley catheter removed 3/11  Normocytic Anemia -Will continue to monitor CBC -  Hemoglobin stable, currently 10.7  Malnutrition -Speech consulted and patient placed on Dysphagia 1 diet -Pending nutrition evaluation -Continue feeding supplements  Code Status: DNR (Spoke with son [name], who provided living will and agreed with DNR status)  Family Communication:Son and caregiver at bedside  Disposition Plan: Admitted.  Pending MRI on 3/14.  Time Spent in minutes   30 minutes  Procedures  Renal US  Consults   Urology Psychiatry Neurology  DVT  Prophylaxis  SCDs  Lab Results  Component Value Date   PLT 309 06/20/2014    Medications  Scheduled Meds: . aspirin EC  325 mg Oral Daily  . donepezil  10 mg Oral QHS  . escitalopram  20 mg Oral Daily  . feeding supplement (ENSURE COMPLETE)  237 mL Oral TID BM  . levothyroxine  150 mcg Oral QAC breakfast  . OLANZapine  7.5 mg Oral BID  . pantoprazole  20 mg Oral q morning - 10a  . sodium chloride  3 mL Intravenous Q12H   Continuous Infusions: . sodium chloride 50 mL/hr at 06/19/14 1815   PRN Meds:.hyoscyamine, menthol-cetylpyridinium, ondansetron **OR** ondansetron (ZOFRAN) IV, opium-belladonna  Antibiotics    Anti-infectives    Start     Dose/Rate Route Frequency Ordered Stop   06/13/14 2000  cefTRIAXone (ROCEPHIN) 1 g in dextrose 5 % 50 mL IVPB - Premix  Status:  Discontinued     1 g 100 mL/hr over 30 Minutes Intravenous Every 24 hours 06/12/14 2354 06/19/14 1151   06/12/14 2115  cefTRIAXone (ROCEPHIN) 1 g in dextrose 5 % 50 mL IVPB     1 g 100 mL/hr over 30 Minutes Intravenous  Once 06/12/14 2114 06/12/14 2207        Subjective:   [name] [name] seen and examined today.  Patient currently has no complaints- altered this morning as compared to previous morning.  Can answer questions- but not appropriately.   Objective:   Filed Vitals:   06/19/14 0700 06/19/14 1314 06/19/14 2147 06/20/14 0513  BP: 131/91 125/76 126/56 134/86  Pulse: 69 73 69 71  Temp: 97.6 F (36.4 C) 98 F (36.7 C) 98.1 F (36.7 C) 97.7 F (36.5 C)  TempSrc: Axillary Oral Axillary Oral  Resp: 20 18 18 20   Height:      Weight: 98.6 kg (217 lb 6 oz)   98.2 kg (216 lb 7.9 oz)  SpO2: 90% 95% 100% 96%    Wt Readings from Last 3 Encounters:  06/20/14 98.2 kg (216 lb 7.9 oz)  06/05/14 107.049 kg (236 lb)  04/22/14 106.142 kg (234 lb)     Intake/Output Summary (Last 24 hours) at 06/20/14 0951 Last data filed at 06/20/14 0600  Gross per 24 hour  Intake   1510 ml  Output    450 ml  Net    1060 ml    Exam  General: Well developed  HEENT: NCAT, mucous membranes dry  Cardiovascular: S1 S2 auscultated, RRR  Respiratory: Clear to auscultation   Abdomen: Soft, nontender, nondistended, + bowel sounds  Extremities: warm dry without cyanosis clubbing or edema- currently in restraints  Neuro: Awake, not alert or oriented, moves all extremities spontaneously.  Data Review   Micro Results Recent Results (from the past 240 hour(s))  Urine culture     Status: None   Collection Time: 06/12/14  6:45 PM  Result Value Ref Range Status   Specimen Description URINE, CATHETERIZED  Final   Special Requests NONE  Final   Colony Count NO  GROWTH Performed at [facility]   Final   Culture NO GROWTH Performed at [facility]   Final   Report Status 06/15/2014 FINAL  Final  Blood culture (routine x 2)     Status: None   Collection Time: 06/12/14  9:25 PM  Result Value Ref Range Status   Specimen Description BLOOD LEFT ARM  Final   Special Requests BOTTLES DRAWN AEROBIC AND ANAEROBIC 6CC  Final   Culture NO GROWTH 5 DAYS  Final   Report Status 06/17/2014 FINAL  Final  Blood culture (routine x 2)     Status: None   Collection Time: 06/12/14  9:30 PM  Result Value Ref Range Status   Specimen Description BLOOD LEFT HAND  Final   Special Requests BOTTLES DRAWN AEROBIC AND ANAEROBIC 6CC  Final   Culture NO GROWTH 5 DAYS  Final   Report Status 06/17/2014 FINAL  Final  Urine culture     Status: None   Collection Time: 06/13/14  9:50 AM  Result Value Ref Range Status   Specimen Description URINE, CATHETERIZED  Final   Special Requests NONE  Final   Colony Count NO GROWTH Performed at [facility]   Final   Culture NO GROWTH Performed at [facility]   Final   Report Status 06/14/2014 FINAL  Final    Radiology Reports Dg Chest 2 View  06/12/2014   CLINICAL DATA:  Acute onset of altered mental status. Initial encounter.  EXAM: CHEST  2 VIEW   COMPARISON:  Chest radiograph performed 01/28/2014  FINDINGS: The lungs are well-aerated. Mild peribronchial thickening is noted. There is no evidence of focal opacification, pleural effusion or pneumothorax.  The heart is borderline normal in size. The patient is status post median sternotomy. No acute osseous abnormalities are seen.  IMPRESSION: Mild peribronchial thickening noted; lungs otherwise clear.   Electronically Signed   By: Garald Balding M.D.   On: 06/12/2014 20:41   Ct Head Wo Contrast  06/12/2014   CLINICAL DATA:  Confusion. Hallucination. Recent nephrectomy. Bladder cancer. Mild dementia.  EXAM: CT HEAD WITHOUT CONTRAST  TECHNIQUE: Contiguous axial images were obtained from the base of the skull through the vertex without intravenous contrast.  COMPARISON:  01/23/2014  FINDINGS: Sinuses/Soft tissues: Motion degradation. Mucosal thickening of bilateral maxillary sinuses. Fluid in the right frontal sinus. Clear mastoid air cells.  Intracranial: Mild low density in the periventricular white matter likely related to small vessel disease. Cerebral volume loss which is normal for age. No mass lesion, hemorrhage, hydrocephalus, acute infarct, intra-axial, or extra-axial fluid collection.  IMPRESSION: 1.  No acute intracranial abnormality. 2. Sinus disease. 3. Mild small vessel ischemic change.   Electronically Signed   By: Abigail Miyamoto M.D.   On: 06/12/2014 20:27   Dg Cystogram  06/18/2014   CLINICAL DATA:  Status post right nephroureterectomy with excision of right ureteral orifice, evaluate for leak  EXAM: CYSTOGRAM  TECHNIQUE: After catheterization of the urinary bladder following sterile technique the bladder was filled with 150 mL Cysto-Hypaque 30% by drip infusion. Serial spot images were obtained during bladder filling and post draining.  FLUOROSCOPY TIME:  1 minutes 32 seconds  COMPARISON:  None.  FINDINGS: Surgical clips overlying the right abdomen/ pelvis.  Right lateral bladder is mildly  irregular.  No evidence of bladder leak.  Reflux of contrast into the left ureter to the level the left proximal collecting system.  Postvoid/drainage image is within normal limits.  IMPRESSION: Postsurgical  changes related to prior right nephroureterectomy.  No evidence of bladder leak.   Electronically Signed   By: Julian Hy M.D.   On: 06/18/2014 15:55   US Renal  06/13/2014   CLINICAL DATA:  Elevated BUN/creatinine.  EXAM: RENAL/URINARY TRACT ULTRASOUND COMPLETE  COMPARISON:  CT 03/13/2014  FINDINGS: Right Kidney:  Surgically absent  Left Kidney: 12.0 cm. Normal renal cortical thickness for age. No hydronephrosis. Left renal collecting system stone or stones. Most well-defined measures 5 mm in the inter/lower pole region.  Bladder: Per clinical history, the patient has a Foley catheter. It is not visualized in the bladder, and the bladder is not decompressed.  IMPRESSION: 1. Status post right nephrectomy. 2. No evidence of left-sided hydronephrosis. At least 1 left renal collecting system calculus identified. 3. Foley catheter not identified within the urinary bladder. Bladder is not decompressed. Correlate with position and function of Foley catheter. These results will be called to the ordering clinician or representative by the Radiologist Assistant, and communication documented in the PACS or zVision Dashboard.   Electronically Signed   By: Abigail Miyamoto M.D.   On: 06/13/2014 12:29    CBC  Recent Labs Lab 06/14/14 1105 06/15/14 0550 06/18/14 0440 06/20/14 0520  WBC 10.5 10.3 10.2 8.3  HGB 10.4* 9.8* 11.0* 10.7*  HCT 32.2* 31.0* 34.9* 33.5*  PLT 290 261 272 309  MCV 88.5 90.1 90.6 90.3  MCH 28.6 28.5 28.6 28.8  MCHC 32.3 31.6 31.5 31.9  RDW 15.6* 16.0* 15.7* 15.6*  LYMPHSABS 0.7 1.8  --   --   MONOABS 0.4 0.8  --   --   EOSABS 0.0 0.6  --   --   BASOSABS 0.0 0.1  --   --     Chemistries   Recent Labs Lab 06/14/14 1105 06/15/14 0550 06/18/14 0440 06/19/14 0520  06/20/14 0520  NA 140 143 140 145 143  K 3.4* 3.7 3.3* 3.9 4.1  CL 108 109 106 107 107  CO2 27 28 29 30 27   GLUCOSE 137* 82 110* 91 84  BUN 18 19 19 16 13   CREATININE 1.71* 1.82* 1.78* 1.67* 1.68*  CALCIUM 8.6 8.6 8.6 9.0 9.3  AST 38* 35  --   --   --   ALT 18 18  --   --   --   ALKPHOS 49 51  --   --   --   BILITOT 0.5 0.5  --   --   --    ------------------------------------------------------------------------------------------------------------------ estimated creatinine clearance is 42.6 mL/min (by C-G formula based on Cr of 1.68). ------------------------------------------------------------------------------------------------------------------ No results for input(s): HGBA1C in the last 72 hours. ------------------------------------------------------------------------------------------------------------------ No results for input(s): CHOL, HDL, LDLCALC, TRIG, CHOLHDL, LDLDIRECT in the last 72 hours. ------------------------------------------------------------------------------------------------------------------ No results for input(s): TSH, T4TOTAL, T3FREE, THYROIDAB in the last 72 hours.  Invalid input(s): FREET3 ------------------------------------------------------------------------------------------------------------------ No results for input(s): VITAMINB12, FOLATE, FERRITIN, TIBC, IRON, RETICCTPCT in the last 72 hours.  Coagulation profile No results for input(s): INR, PROTIME in the last 168 hours.  No results for input(s): DDIMER in the last 72 hours.  Cardiac Enzymes  Recent Labs Lab 06/13/14 1154  TROPONINI 0.20*   ------------------------------------------------------------------------------------------------------------------ Invalid input(s): POCBNP    Ninetta Adelstein D.O. on 06/20/2014 at 9:51 AM  Between 7am to 7pm - Pager - [phone]  After 7pm go to [email or web address] - password [email or web address]  And look for the night coverage person covering for me after  hours  [facility] Office  [phone]

## 2014-06-21 NOTE — Progress Notes (Signed)
[facility]                                                                              Patient Demographics  [name] [name], is a 81 y.o. male, DOB - [birth year], IDP:[id number]  Admit date - 06/12/2014   Admitting Physician Theressa Millard, MD  Outpatient Primary MD for the patient is Purvis Kilts, MD  LOS - 9   Chief Complaint  Patient presents with  . Altered Mental Status      HPI on 06/12/2014 by Dr. Derrill Kay 81 yo male h/o mild to mod dementia, s/p cabg end of 2015, s/p nephrectomy for TCC last week at [facility] long, ckd comes in with family for confusion and agitation today. Was d/c from [place] about 4 days ago. Has not really been eating or drinking well. Not been complaining of any pain but has not been sleeping well either. No fevers. No n/v/d. Has foley cath in since surgery, family thought there may be something wrong with it so they changed it today, and has been having good uop. He has fallen several times since hes been home and is generally getting weaker. He was on abx perioperatively but not sent home on any as his cultures were all negative. He had most recent urine culture this past Monday which family reports was negative. No focal neurological deficits. No swelling in his legs. Still has staples to surgical sites on abdomen which have been well healing, no discharge, no other rashes. Asked to admit for his delirium/encephalopathy. No coughing. No sob.  Assessment & Plan  Toxic Metabolic Encephalopathy with Delirium -Waxes and wanes -Delirium worsens at night -Likely complicated by polypharmacy vs hypoxia vs insomnia vs constipation -Status post right laparoscopic nephroureterectomy and cystoscopy -Patient has been off of restraints and haldol, started on geodon 06/20/2014 -Ammonia 12, TSH 13.484 -Urine cultures were negative, blood cultures negative -Psychiatry consulted -Ceftriaxone discontinued  -Neurology consulted and appreciated-  it seems upon evaluation 3/10, patient was having visual problems, however this seems to have subsided.  MRI of the brain was ordered, cancelled by one son, and wanted by another.  Patient's family understand that due to patient's inability to stay still, MRI would have to be done under general anesthesia.  Sons agreed.  Tentative date 06/22/2014.  Please note, haldol will not sedate patient enough to obtain adequate study, and patient is intolerant to Ativan (Discussed with neurology). -Palliative care consulted and GREATLY appreciated- pt started on tylenol, fentanyl, avoid restraints  Demand ischemia with elevated troponin -Troponin trending downward, likely secondary to the above, no further workup  History of CABG -On 12/17/2013 -Currently not on beta blocker due to orthostasis, no ACE inhibitor due to renal failure -Continue aspirin 325 mg daily  Hypothyroidism -TSH 13.484, continue synthroid  Polypharmacy -Psychiatry consulted- patient does not need inpatient psych -Psychiatry titrating meds, Zyprexa 7.5mg  BID, lexapro  Acute renal insufficiency with mild hypokalemia -Upon admission, creatinine was 2.15 -Baseline 1.2, currently 1.68 -Continue to monitor BMP  Orthostatic hypotension -Midodrine and Florinef held- due to soft pressures  Obstructive sleep apnea -Unable to tolerate C-peptide night due to confusion  Transitional cell carcinoma -S/p laparoscopic nephroureterectomy -Urology following  appreciated -Cystogram 3/10- no bladder leak -foley catheter removed 3/11  Normocytic Anemia -Will continue to monitor CBC -Hemoglobin stable, currently 10.7  Malnutrition -Speech consulted and patient placed on Dysphagia 1 diet -Pending nutrition evaluation -Continue feeding supplements  Constipation -Continue bowel regimen  Code Status: DNR (Spoke with son [name], who provided living will and agreed with DNR status)  Family Communication:Caregiver at bedside. Son via  phone.  Disposition Plan: Admitted.  Pending MRI on 3/14.  Time Spent in minutes   30 minutes  Procedures  Renal US  Consults   Urology Psychiatry Neurology  DVT Prophylaxis  SCDs  Lab Results  Component Value Date   PLT 309 06/20/2014    Medications  Scheduled Meds: . acetaminophen  1,000 mg Oral TID  . aspirin EC  325 mg Oral Daily  . donepezil  10 mg Oral QHS  . escitalopram  20 mg Oral Daily  . feeding supplement (ENSURE COMPLETE)  237 mL Oral TID BM  . levothyroxine  150 mcg Oral QAC breakfast  . pantoprazole  20 mg Oral q morning - 10a  . senna-docusate  2 tablet Oral QHS  . sodium chloride  3 mL Intravenous Q12H  . ziprasidone  20 mg Oral BID WC   Continuous Infusions: . sodium chloride 50 mL/hr at 06/19/14 1815   PRN Meds:.acetaminophen, bisacodyl, fentaNYL, menthol-cetylpyridinium, ondansetron **OR** ondansetron (ZOFRAN) IV  Antibiotics    Anti-infectives    Start     Dose/Rate Route Frequency Ordered Stop   06/13/14 2000  cefTRIAXone (ROCEPHIN) 1 g in dextrose 5 % 50 mL IVPB - Premix  Status:  Discontinued     1 g 100 mL/hr over 30 Minutes Intravenous Every 24 hours 06/12/14 2354 06/19/14 1151   06/12/14 2115  cefTRIAXone (ROCEPHIN) 1 g in dextrose 5 % 50 mL IVPB     1 g 100 mL/hr over 30 Minutes Intravenous  Once 06/12/14 2114 06/12/14 2207        Subjective:   [name] [name] seen and examined today.  Patient currently resting comfortably.  Attempted to wake up, and was swinging his arms.  Patient denied complaints and wanted to sleep.   Objective:   Filed Vitals:   06/20/14 0513 06/20/14 1600 06/20/14 2134 06/21/14 0512  BP: 134/86 140/78 100/69 139/65  Pulse: 71 75 66 63  Temp: 97.7 F (36.5 C) 97.9 F (36.6 C) 98.3 F (36.8 C) 97.9 F (36.6 C)  TempSrc: Oral Axillary Oral Oral  Resp: 20 20 18 20   Height:      Weight: 98.2 kg (216 lb 7.9 oz)   95.2 kg (209 lb 14.1 oz)  SpO2: 96% 95% 93% 93%    Wt Readings from Last 3 Encounters:    06/21/14 95.2 kg (209 lb 14.1 oz)  06/05/14 107.049 kg (236 lb)  04/22/14 106.142 kg (234 lb)     Intake/Output Summary (Last 24 hours) at 06/21/14 1009 Last data filed at 06/20/14 2200  Gross per 24 hour  Intake    360 ml  Output      0 ml  Net    360 ml    Exam  General: Well developed  HEENT: NCAT, mucous membranes dry  Cardiovascular: S1 S2 auscultated, RRR  Respiratory: Clear to auscultation   Abdomen: Soft, nontender, nondistended, + bowel sounds  Neuro: Awake, not alert or oriented, moves all extremities spontaneously.  Data Review   Micro Results Recent Results (from the past 240 hour(s))  Urine culture  Status: None   Collection Time: 06/12/14  6:45 PM  Result Value Ref Range Status   Specimen Description URINE, CATHETERIZED  Final   Special Requests NONE  Final   Colony Count NO GROWTH Performed at [facility]   Final   Culture NO GROWTH Performed at [facility]   Final   Report Status 06/15/2014 FINAL  Final  Blood culture (routine x 2)     Status: None   Collection Time: 06/12/14  9:25 PM  Result Value Ref Range Status   Specimen Description BLOOD LEFT ARM  Final   Special Requests BOTTLES DRAWN AEROBIC AND ANAEROBIC 6CC  Final   Culture NO GROWTH 5 DAYS  Final   Report Status 06/17/2014 FINAL  Final  Blood culture (routine x 2)     Status: None   Collection Time: 06/12/14  9:30 PM  Result Value Ref Range Status   Specimen Description BLOOD LEFT HAND  Final   Special Requests BOTTLES DRAWN AEROBIC AND ANAEROBIC 6CC  Final   Culture NO GROWTH 5 DAYS  Final   Report Status 06/17/2014 FINAL  Final  Urine culture     Status: None   Collection Time: 06/13/14  9:50 AM  Result Value Ref Range Status   Specimen Description URINE, CATHETERIZED  Final   Special Requests NONE  Final   Colony Count NO GROWTH Performed at [facility]   Final   Culture NO GROWTH Performed at [facility]   Final   Report Status  06/14/2014 FINAL  Final    Radiology Reports Dg Chest 2 View  06/12/2014   CLINICAL DATA:  Acute onset of altered mental status. Initial encounter.  EXAM: CHEST  2 VIEW  COMPARISON:  Chest radiograph performed 01/28/2014  FINDINGS: The lungs are well-aerated. Mild peribronchial thickening is noted. There is no evidence of focal opacification, pleural effusion or pneumothorax.  The heart is borderline normal in size. The patient is status post median sternotomy. No acute osseous abnormalities are seen.  IMPRESSION: Mild peribronchial thickening noted; lungs otherwise clear.   Electronically Signed   By: Garald Balding M.D.   On: 06/12/2014 20:41   Ct Head Wo Contrast  06/12/2014   CLINICAL DATA:  Confusion. Hallucination. Recent nephrectomy. Bladder cancer. Mild dementia.  EXAM: CT HEAD WITHOUT CONTRAST  TECHNIQUE: Contiguous axial images were obtained from the base of the skull through the vertex without intravenous contrast.  COMPARISON:  01/23/2014  FINDINGS: Sinuses/Soft tissues: Motion degradation. Mucosal thickening of bilateral maxillary sinuses. Fluid in the right frontal sinus. Clear mastoid air cells.  Intracranial: Mild low density in the periventricular white matter likely related to small vessel disease. Cerebral volume loss which is normal for age. No mass lesion, hemorrhage, hydrocephalus, acute infarct, intra-axial, or extra-axial fluid collection.  IMPRESSION: 1.  No acute intracranial abnormality. 2. Sinus disease. 3. Mild small vessel ischemic change.   Electronically Signed   By: Abigail Miyamoto M.D.   On: 06/12/2014 20:27   Dg Cystogram  06/18/2014   CLINICAL DATA:  Status post right nephroureterectomy with excision of right ureteral orifice, evaluate for leak  EXAM: CYSTOGRAM  TECHNIQUE: After catheterization of the urinary bladder following sterile technique the bladder was filled with 150 mL Cysto-Hypaque 30% by drip infusion. Serial spot images were obtained during bladder filling and  post draining.  FLUOROSCOPY TIME:  1 minutes 32 seconds  COMPARISON:  None.  FINDINGS: Surgical clips overlying the right abdomen/ pelvis.  Right lateral  bladder is mildly irregular.  No evidence of bladder leak.  Reflux of contrast into the left ureter to the level the left proximal collecting system.  Postvoid/drainage image is within normal limits.  IMPRESSION: Postsurgical changes related to prior right nephroureterectomy.  No evidence of bladder leak.   Electronically Signed   By: Julian Hy M.D.   On: 06/18/2014 15:55   US Renal  06/13/2014   CLINICAL DATA:  Elevated BUN/creatinine.  EXAM: RENAL/URINARY TRACT ULTRASOUND COMPLETE  COMPARISON:  CT 03/13/2014  FINDINGS: Right Kidney:  Surgically absent  Left Kidney: 12.0 cm. Normal renal cortical thickness for age. No hydronephrosis. Left renal collecting system stone or stones. Most well-defined measures 5 mm in the inter/lower pole region.  Bladder: Per clinical history, the patient has a Foley catheter. It is not visualized in the bladder, and the bladder is not decompressed.  IMPRESSION: 1. Status post right nephrectomy. 2. No evidence of left-sided hydronephrosis. At least 1 left renal collecting system calculus identified. 3. Foley catheter not identified within the urinary bladder. Bladder is not decompressed. Correlate with position and function of Foley catheter. These results will be called to the ordering clinician or representative by the Radiologist Assistant, and communication documented in the PACS or zVision Dashboard.   Electronically Signed   By: Abigail Miyamoto M.D.   On: 06/13/2014 12:29    CBC  Recent Labs Lab 06/14/14 1105 06/15/14 0550 06/18/14 0440 06/20/14 0520  WBC 10.5 10.3 10.2 8.3  HGB 10.4* 9.8* 11.0* 10.7*  HCT 32.2* 31.0* 34.9* 33.5*  PLT 290 261 272 309  MCV 88.5 90.1 90.6 90.3  MCH 28.6 28.5 28.6 28.8  MCHC 32.3 31.6 31.5 31.9  RDW 15.6* 16.0* 15.7* 15.6*  LYMPHSABS 0.7 1.8  --   --   MONOABS 0.4 0.8  --    --   EOSABS 0.0 0.6  --   --   BASOSABS 0.0 0.1  --   --     Chemistries   Recent Labs Lab 06/14/14 1105 06/15/14 0550 06/18/14 0440 06/19/14 0520 06/20/14 0520  NA 140 143 140 145 143  K 3.4* 3.7 3.3* 3.9 4.1  CL 108 109 106 107 107  CO2 27 28 29 30 27   GLUCOSE 137* 82 110* 91 84  BUN 18 19 19 16 13   CREATININE 1.71* 1.82* 1.78* 1.67* 1.68*  CALCIUM 8.6 8.6 8.6 9.0 9.3  AST 38* 35  --   --   --   ALT 18 18  --   --   --   ALKPHOS 49 51  --   --   --   BILITOT 0.5 0.5  --   --   --    ------------------------------------------------------------------------------------------------------------------ estimated creatinine clearance is 42 mL/min (by C-G formula based on Cr of 1.68). ------------------------------------------------------------------------------------------------------------------ No results for input(s): HGBA1C in the last 72 hours. ------------------------------------------------------------------------------------------------------------------ No results for input(s): CHOL, HDL, LDLCALC, TRIG, CHOLHDL, LDLDIRECT in the last 72 hours. ------------------------------------------------------------------------------------------------------------------ No results for input(s): TSH, T4TOTAL, T3FREE, THYROIDAB in the last 72 hours.  Invalid input(s): FREET3 ------------------------------------------------------------------------------------------------------------------ No results for input(s): VITAMINB12, FOLATE, FERRITIN, TIBC, IRON, RETICCTPCT in the last 72 hours.  Coagulation profile No results for input(s): INR, PROTIME in the last 168 hours.  No results for input(s): DDIMER in the last 72 hours.  Cardiac Enzymes No results for input(s): CKMB, TROPONINI, MYOGLOBIN in the last 168 hours.  Invalid input(s): CK ------------------------------------------------------------------------------------------------------------------ Invalid input(s):  POCBNP    Mccauley Diehl D.O. on 06/21/2014 at  10:09 AM  Between 7am to 7pm - Pager - [phone]  After 7pm go to [email or web address] - password [email or web address]  And look for the night coverage person covering for me after hours  [facility] Office  [phone]

## 2014-06-21 NOTE — Progress Notes (Signed)
Patient continued to be restless last night. No oral medications given as patient did not appear alert enough to swallow oral meds.  Rash on patient's back-heat rash?

## 2014-06-21 NOTE — Progress Notes (Signed)
I have reviewed the chart for p 24 hour update. Intermittent periods of relative lucidity.  Somnolent this AM.   4-point restraints. I will continue to follow.

## 2014-06-21 NOTE — Progress Notes (Signed)
RT Note:  Pts family refused cpap at this time.

## 2014-06-21 NOTE — Anesthesia Preprocedure Evaluation (Addendum)
Anesthesia Evaluation  Patient identified by MRN, date of birth, ID band  Reviewed: Allergy & Precautions, NPO status , Patient's Chart, lab work & pertinent test results  Airway Mallampati: II  TM Distance: >3 FB Neck ROM: Full    Dental  (+) Teeth Intact, Dental Advisory Given   Pulmonary sleep apnea , former smoker,          Cardiovascular  CABG x 4  12/2013   Neuro/Psych Agitation and confusion SP nephrectomy last week    GI/Hepatic hiatal hernia, GERD-  Medicated,  Endo/Other  diabetes, Type 2  Renal/GU Renal diseaseSP nephrectomy last week at [facility], DC home and then re admitted for agitation and confusion     Musculoskeletal   Abdominal   Peds  Hematology 11/33   Anesthesia Other Findings   Reproductive/Obstetrics                           Anesthesia Physical Anesthesia Plan  ASA: III  Anesthesia Plan: General   Post-op Pain Management:    Induction: Intravenous  Airway Management Planned: Oral ETT  Additional Equipment:   Intra-op Plan:   Post-operative Plan: Extubation in OR  Informed Consent: I have reviewed the patients History and Physical, chart, labs and discussed the procedure including the risks, benefits and alternatives for the proposed anesthesia with the patient or authorized representative who has indicated his/her understanding and acceptance.     Plan Discussed with:   Anesthesia Plan Comments:         Anesthesia Quick Evaluation

## 2014-06-22 ENCOUNTER — Inpatient Hospital Stay (HOSPITAL_COMMUNITY): Payer: Medicare Other | Admitting: Anesthesiology

## 2014-06-22 ENCOUNTER — Encounter (HOSPITAL_COMMUNITY)
Admission: EM | Disposition: A | Discharge status: Skilled Nursing Facility | Payer: Self-pay | Source: Home / Self Care | Attending: Internal Medicine

## 2014-06-22 ENCOUNTER — Ambulatory Visit (HOSPITAL_COMMUNITY)
Admission: RE | Admit: 2014-06-22 | Discharge: 2014-06-22 | Disposition: A | Discharge status: Home / Self Care | Payer: Medicare Other | Source: Ambulatory Visit | Attending: Internal Medicine | Admitting: Internal Medicine

## 2014-06-22 ENCOUNTER — Encounter (HOSPITAL_COMMUNITY): Payer: Self-pay | Admitting: Certified Registered"

## 2014-06-22 DIAGNOSIS — R410 Disorientation, unspecified: Secondary | ICD-10-CM | POA: Diagnosis not present

## 2014-06-22 DIAGNOSIS — C679 Malignant neoplasm of bladder, unspecified: Secondary | ICD-10-CM | POA: Diagnosis not present

## 2014-06-22 DIAGNOSIS — D090 Carcinoma in situ of bladder: Secondary | ICD-10-CM | POA: Diagnosis not present

## 2014-06-22 DIAGNOSIS — I951 Orthostatic hypotension: Secondary | ICD-10-CM | POA: Diagnosis not present

## 2014-06-22 DIAGNOSIS — R451 Restlessness and agitation: Secondary | ICD-10-CM

## 2014-06-22 DIAGNOSIS — F068 Other specified mental disorders due to known physiological condition: Secondary | ICD-10-CM | POA: Diagnosis not present

## 2014-06-22 DIAGNOSIS — N179 Acute kidney failure, unspecified: Secondary | ICD-10-CM | POA: Diagnosis not present

## 2014-06-22 DIAGNOSIS — G92 Toxic encephalopathy: Secondary | ICD-10-CM | POA: Diagnosis not present

## 2014-06-22 DIAGNOSIS — N400 Enlarged prostate without lower urinary tract symptoms: Secondary | ICD-10-CM | POA: Diagnosis not present

## 2014-06-22 DIAGNOSIS — Z8551 Personal history of malignant neoplasm of bladder: Secondary | ICD-10-CM | POA: Diagnosis not present

## 2014-06-22 DIAGNOSIS — Z951 Presence of aortocoronary bypass graft: Secondary | ICD-10-CM | POA: Diagnosis not present

## 2014-06-22 DIAGNOSIS — E039 Hypothyroidism, unspecified: Secondary | ICD-10-CM | POA: Diagnosis not present

## 2014-06-22 DIAGNOSIS — M6281 Muscle weakness (generalized): Secondary | ICD-10-CM | POA: Diagnosis not present

## 2014-06-22 DIAGNOSIS — H539 Unspecified visual disturbance: Secondary | ICD-10-CM | POA: Diagnosis not present

## 2014-06-22 DIAGNOSIS — E784 Other hyperlipidemia: Secondary | ICD-10-CM | POA: Diagnosis not present

## 2014-06-22 DIAGNOSIS — F0390 Unspecified dementia without behavioral disturbance: Secondary | ICD-10-CM

## 2014-06-22 DIAGNOSIS — I214 Non-ST elevation (NSTEMI) myocardial infarction: Secondary | ICD-10-CM | POA: Diagnosis not present

## 2014-06-22 DIAGNOSIS — K219 Gastro-esophageal reflux disease without esophagitis: Secondary | ICD-10-CM | POA: Diagnosis not present

## 2014-06-22 DIAGNOSIS — N390 Urinary tract infection, site not specified: Secondary | ICD-10-CM | POA: Diagnosis not present

## 2014-06-22 DIAGNOSIS — R1310 Dysphagia, unspecified: Secondary | ICD-10-CM | POA: Diagnosis not present

## 2014-06-22 DIAGNOSIS — N183 Chronic kidney disease, stage 3 (moderate): Secondary | ICD-10-CM | POA: Diagnosis not present

## 2014-06-22 DIAGNOSIS — R51 Headache: Secondary | ICD-10-CM | POA: Diagnosis not present

## 2014-06-22 DIAGNOSIS — N139 Obstructive and reflux uropathy, unspecified: Secondary | ICD-10-CM | POA: Diagnosis not present

## 2014-06-22 DIAGNOSIS — K5900 Constipation, unspecified: Secondary | ICD-10-CM | POA: Diagnosis not present

## 2014-06-22 DIAGNOSIS — F0150 Vascular dementia without behavioral disturbance: Secondary | ICD-10-CM | POA: Diagnosis not present

## 2014-06-22 DIAGNOSIS — Z79899 Other long term (current) drug therapy: Secondary | ICD-10-CM | POA: Diagnosis not present

## 2014-06-22 DIAGNOSIS — G9341 Metabolic encephalopathy: Secondary | ICD-10-CM | POA: Diagnosis not present

## 2014-06-22 DIAGNOSIS — E785 Hyperlipidemia, unspecified: Secondary | ICD-10-CM | POA: Diagnosis not present

## 2014-06-22 DIAGNOSIS — C649 Malignant neoplasm of unspecified kidney, except renal pelvis: Secondary | ICD-10-CM | POA: Diagnosis not present

## 2014-06-22 DIAGNOSIS — E876 Hypokalemia: Secondary | ICD-10-CM | POA: Diagnosis not present

## 2014-06-22 DIAGNOSIS — I248 Other forms of acute ischemic heart disease: Secondary | ICD-10-CM | POA: Diagnosis not present

## 2014-06-22 DIAGNOSIS — R41841 Cognitive communication deficit: Secondary | ICD-10-CM | POA: Diagnosis not present

## 2014-06-22 DIAGNOSIS — I259 Chronic ischemic heart disease, unspecified: Secondary | ICD-10-CM | POA: Diagnosis not present

## 2014-06-22 DIAGNOSIS — C801 Malignant (primary) neoplasm, unspecified: Secondary | ICD-10-CM | POA: Diagnosis not present

## 2014-06-22 DIAGNOSIS — F339 Major depressive disorder, recurrent, unspecified: Secondary | ICD-10-CM | POA: Diagnosis not present

## 2014-06-22 DIAGNOSIS — G4733 Obstructive sleep apnea (adult) (pediatric): Secondary | ICD-10-CM | POA: Diagnosis not present

## 2014-06-22 DIAGNOSIS — R7989 Other specified abnormal findings of blood chemistry: Secondary | ICD-10-CM | POA: Diagnosis not present

## 2014-06-22 DIAGNOSIS — R2681 Unsteadiness on feet: Secondary | ICD-10-CM | POA: Diagnosis not present

## 2014-06-22 DIAGNOSIS — E875 Hyperkalemia: Secondary | ICD-10-CM | POA: Diagnosis not present

## 2014-06-22 DIAGNOSIS — D649 Anemia, unspecified: Secondary | ICD-10-CM | POA: Diagnosis not present

## 2014-06-22 DIAGNOSIS — C719 Malignant neoplasm of brain, unspecified: Secondary | ICD-10-CM | POA: Insufficient documentation

## 2014-06-22 DIAGNOSIS — F05 Delirium due to known physiological condition: Secondary | ICD-10-CM | POA: Diagnosis not present

## 2014-06-22 DIAGNOSIS — Z681 Body mass index (BMI) 19 or less, adult: Secondary | ICD-10-CM | POA: Diagnosis not present

## 2014-06-22 DIAGNOSIS — M1990 Unspecified osteoarthritis, unspecified site: Secondary | ICD-10-CM | POA: Diagnosis not present

## 2014-06-22 DIAGNOSIS — F0391 Unspecified dementia with behavioral disturbance: Secondary | ICD-10-CM | POA: Diagnosis not present

## 2014-06-22 DIAGNOSIS — E46 Unspecified protein-calorie malnutrition: Secondary | ICD-10-CM | POA: Diagnosis not present

## 2014-06-22 DIAGNOSIS — R1314 Dysphagia, pharyngoesophageal phase: Secondary | ICD-10-CM | POA: Diagnosis not present

## 2014-06-22 DIAGNOSIS — H540 Blindness, both eyes: Secondary | ICD-10-CM | POA: Diagnosis not present

## 2014-06-22 DIAGNOSIS — C641 Malignant neoplasm of right kidney, except renal pelvis: Secondary | ICD-10-CM | POA: Diagnosis not present

## 2014-06-22 DIAGNOSIS — E559 Vitamin D deficiency, unspecified: Secondary | ICD-10-CM | POA: Diagnosis not present

## 2014-06-22 DIAGNOSIS — F0280 Dementia in other diseases classified elsewhere without behavioral disturbance: Secondary | ICD-10-CM | POA: Diagnosis not present

## 2014-06-22 DIAGNOSIS — R262 Difficulty in walking, not elsewhere classified: Secondary | ICD-10-CM | POA: Diagnosis not present

## 2014-06-22 DIAGNOSIS — N289 Disorder of kidney and ureter, unspecified: Secondary | ICD-10-CM | POA: Diagnosis not present

## 2014-06-22 DIAGNOSIS — Z638 Other specified problems related to primary support group: Secondary | ICD-10-CM | POA: Diagnosis not present

## 2014-06-22 HISTORY — PX: RADIOLOGY WITH ANESTHESIA: SHX6223

## 2014-06-22 LAB — GLUCOSE, CAPILLARY
Glucose-Capillary: 87 mg/dL (ref 70–99)
Glucose-Capillary: 89 mg/dL (ref 70–99)

## 2014-06-22 SURGERY — RADIOLOGY WITH ANESTHESIA
Anesthesia: General

## 2014-06-22 MED ORDER — PHENYLEPHRINE HCL 10 MG/ML IJ SOLN
INTRAMUSCULAR | Status: DC | PRN
  Administered 2014-06-22: 120 ug via INTRAVENOUS
  Administered 2014-06-22: 80 ug via INTRAVENOUS
  Administered 2014-06-22: 120 ug via INTRAVENOUS

## 2014-06-22 MED ORDER — PROMETHAZINE HCL 25 MG/ML IJ SOLN: 6.2500 mg | INTRAMUSCULAR | Status: DC | PRN

## 2014-06-22 MED ORDER — SENNOSIDES-DOCUSATE SODIUM 8.6-50 MG PO TABS: 2.0000 | ORAL_TABLET | Freq: Every day | ORAL | Status: DC

## 2014-06-22 MED ORDER — PROPOFOL 10 MG/ML IV BOLUS
INTRAVENOUS | Status: DC | PRN
  Administered 2014-06-22: 150 mg via INTRAVENOUS

## 2014-06-22 MED ORDER — LACTATED RINGERS IV SOLN
INTRAVENOUS | Status: DC | PRN
  Administered 2014-06-22: 11:00:00 via INTRAVENOUS

## 2014-06-22 MED ORDER — MENTHOL 3 MG MT LOZG: 1.0000 | LOZENGE | OROMUCOSAL | Status: DC | PRN

## 2014-06-22 MED ORDER — LIDOCAINE HCL (CARDIAC) 20 MG/ML IV SOLN
INTRAVENOUS | Status: DC | PRN
  Administered 2014-06-22: 50 mg via INTRAVENOUS

## 2014-06-22 MED ORDER — ACETAMINOPHEN 500 MG PO TABS: 1000.0000 mg | ORAL_TABLET | Freq: Three times a day (TID) | ORAL | Status: DC

## 2014-06-22 MED ORDER — ZIPRASIDONE HCL 20 MG PO CAPS: 20.0000 mg | ORAL_CAPSULE | Freq: Two times a day (BID) | ORAL | Status: DC

## 2014-06-22 MED ORDER — EPHEDRINE SULFATE 50 MG/ML IJ SOLN
INTRAMUSCULAR | Status: DC | PRN
  Administered 2014-06-22: 5 mg via INTRAVENOUS
  Administered 2014-06-22: 10 mg via INTRAVENOUS
  Administered 2014-06-22: 5 mg via INTRAVENOUS

## 2014-06-22 MED ORDER — BISACODYL 10 MG RE SUPP: 10.0000 mg | Freq: Every day | RECTAL | Status: DC | PRN

## 2014-06-22 MED ORDER — MEPERIDINE HCL 25 MG/ML IJ SOLN: 6.2500 mg | INTRAMUSCULAR | Status: DC | PRN

## 2014-06-22 MED ORDER — FENTANYL CITRATE 0.05 MG/ML IJ SOLN: 25.0000 ug | INTRAMUSCULAR | Status: DC | PRN

## 2014-06-22 MED ORDER — LACTATED RINGERS IV SOLN
INTRAVENOUS | Status: DC
  Administered 2014-06-22: 50 mL/h via INTRAVENOUS

## 2014-06-22 MED ORDER — SUCCINYLCHOLINE CHLORIDE 20 MG/ML IJ SOLN
INTRAMUSCULAR | Status: DC | PRN
  Administered 2014-06-22: 100 mg via INTRAVENOUS

## 2014-06-22 MED ORDER — ENSURE COMPLETE PO LIQD: 237.0000 mL | Freq: Three times a day (TID) | ORAL | Status: DC

## 2014-06-22 NOTE — Discharge Summary (Addendum)
Physician Discharge Summary  [name] [name] PPJ:[id number] DOB: 06/17/1933 DOA: 06/12/2014  PCP: Purvis Kilts, MD  Admit date: 06/12/2014 Discharge date: 06/22/2014  Time spent: 45 minutes  Recommendations for Outpatient Follow-up:  Patient will be discharged [facility] nursing facility.  Patient should continue therapy as recommended by the facility.  Patient should continue his medications as prescribed.   Palliative care can be consulted at the nursing facility.  Patient will need to follow up with the physician at the nursing facility or his primary care physician within 3 days. Patient should also follow up with Dr. Louis Meckel, urologist, within 4-6 weeks.  Patient should follow a dysphagia 1 diet.   Discharge Diagnoses:  Toxic metabolic encephalopathy with delirium Demand ischemia with elevated troponin History of CABG Hypothyroidism Polypharmacy Acute renal insufficiency with mild hypokalemia Orthostatic hypotension Obstructive sleep apnea Transitional cell carcinoma Normocytic Anemia Malnutrition Constipation  Discharge Condition: Stable  Diet recommendation: Dysphagia 1  Filed Weights   06/20/14 0513 06/21/14 0512 06/22/14 0544  Weight: 98.2 kg (216 lb 7.9 oz) 95.2 kg (209 lb 14.1 oz) 95.4 kg (210 lb 5.1 oz)    History of present illness:  on 06/12/2014 by Dr. Derrill Kay 81 yo male h/o mild to mod dementia, s/p cabg end of 2015, s/p nephrectomy for TCC last week at [place], ckd comes in with family for confusion and agitation today. Was d/c from [place] about 4 days ago. Has not really been eating or drinking well. Not been complaining of any pain but has not been sleeping well either. No fevers. No n/v/d. Has foley cath in since surgery, family thought there may be something wrong with it so they changed it today, and has been having good uop. He has fallen several times since hes been home and is generally getting weaker. He was on abx perioperatively  but not sent home on any as his cultures were all negative. He had most recent urine culture this past Monday which family reports was negative. No focal neurological deficits. No swelling in his legs. Still has staples to surgical sites on abdomen which have been well healing, no discharge, no other rashes. Asked to admit for his delirium/encephalopathy. No coughing. No sob.  Hospital Course:  Toxic Metabolic Encephalopathy with Delirium -Waxes and wanes, currently patient is alert and oriented -Delirium worsens at night -Likely complicated by polypharmacy vs hypoxia vs insomnia vs constipation -Status post right laparoscopic nephroureterectomy and cystoscopy -Patient has been off of restraints and haldol, started on geodon 06/20/2014 -Ammonia 12, TSH 13.484 -Urine cultures were negative, blood cultures negative -Psychiatry consulted -Ceftriaxone discontinued  -Neurology consulted and appreciated- it seems upon evaluation 3/10, patient was having visual problems, however this seems to have subsided. MRI of the brain was ordered, cancelled by one son, and wanted by another. Patient's family understand that due to patient's inability to stay still, MRI would have to be done under general anesthesia. Sons agreed.  -MRI brain with anesthesia: No acute intracranial findings -Palliative care consulted and can be done at the nursing facility  Demand ischemia with elevated troponin -Troponin trending downward, likely secondary to the above, no further workup  History of CABG -On 12/17/2013 -Currently not on beta blocker due to orthostasis, no ACE inhibitor due to renal failure -Continue aspirin 325 mg daily  Hypothyroidism -TSH 13.484, continue synthroid -Will need to have repeat labs and follow up with his primary care physician  Polypharmacy -Psychiatry consulted- patient does not need inpatient psych -Continue lexapro  Acute renal insufficiency with mild hypokalemia -Upon  admission, creatinine was 2.15 -Baseline 1.2, currently 1.68 -Continue to monitor BMP  Orthostatic hypotension -Midodrine and Florinef held- due to soft pressures  Obstructive sleep apnea -Unable to tolerate C-peptide night due to confusion  Transitional cell carcinoma -S/p laparoscopic nephroureterectomy -Urology following appreciated -Cystogram 3/10- no bladder leak -foley catheter removed 3/11  Normocytic Anemia -Will continue to monitor CBC -Hemoglobin stable, currently 10.7  Malnutrition -Speech consulted and patient placed on Dysphagia 1 diet -Pending nutrition evaluation -Continue feeding supplements  Constipation -Continue bowel regimen  Code Status: DNR  Procedures  Renal US MRI of brain under anesthesia  Consults  Urology Psychiatry Neurology Palliative Care  Discharge Exam: Filed Vitals:   06/22/14 1359  BP: 127/63  Pulse: 62  Temp: 98.1 F (36.7 C)  Resp: 18   Exam  General: Well developed  HEENT: NCAT, mucous membranes moist  Cardiovascular: S1 S2 auscultated, RRR  Respiratory: Clear to auscultation  Abdomen: Soft, nontender, nondistended, + bowel sounds  Neuro: Awake, alert, oriented X2.Nonfocal  Psych: Appropriate affect and demeanor   Discharge Instructions      Discharge Instructions    Discharge instructions    Complete by:  As directed   Patient will be discharged [facility] facility.  Patient should continue therapy as recommended by the facility.  Patient should continue his medications as prescribed.   Palliative care can be consulted at the nursing facility.  Patient will need to follow up with the physician at the nursing facility or his primary care physician within 3 days.  Patient should follow a dysphagia 1 diet.            Medication List    STOP taking these medications        diphenhydramine-acetaminophen 25-500 MG Tabs  Commonly known as:  TYLENOL PM     fludrocortisone 0.1 MG tablet  Commonly  known as:  FLORINEF     HYDROcodone-acetaminophen 5-325 MG per tablet  Commonly known as:  NORCO/VICODIN     midodrine 10 MG tablet  Commonly known as:  PROAMATINE      TAKE these medications        acetaminophen 500 MG tablet  Commonly known as:  TYLENOL  Take 2 tablets (1,000 mg total) by mouth 3 (three) times daily.     aspirin 325 MG EC tablet  Take 1 tablet (325 mg total) by mouth daily.     bisacodyl 10 MG suppository  Commonly known as:  DULCOLAX  Place 1 suppository (10 mg total) rectally daily as needed for moderate constipation.     donepezil 10 MG tablet  Commonly known as:  ARICEPT  Take 10 mg by mouth at bedtime.     escitalopram 20 MG tablet  Commonly known as:  LEXAPRO  Take 20 mg by mouth daily.     feeding supplement (ENSURE COMPLETE) Liqd  Take 237 mLs by mouth 3 (three) times daily between meals.     levothyroxine 125 MCG tablet  Commonly known as:  SYNTHROID, LEVOTHROID  Take 125 mcg by mouth daily before breakfast.     menthol-cetylpyridinium 3 MG lozenge  Commonly known as:  CEPACOL  Take 1 lozenge (3 mg total) by mouth as needed for sore throat.     pantoprazole 40 MG tablet  Commonly known as:  PROTONIX  TAKE ONE TABLET EVERY MORNING     polyethylene glycol packet  Commonly known as:  MIRALAX / GLYCOLAX  Take 17 g  by mouth at bedtime.     senna-docusate 8.6-50 MG per tablet  Commonly known as:  Senokot-S  Take 2 tablets by mouth at bedtime.     solifenacin 5 MG tablet  Commonly known as:  VESICARE  Take 5 mg by mouth daily as needed (TAKES AT BEDTIME AS NEEDED FOR URINATION).     ziprasidone 20 MG capsule  Commonly known as:  GEODON  Take 1 capsule (20 mg total) by mouth 2 (two) times daily with a meal.       Allergies  Allergen Reactions  . Lorazepam     Hallucinations, worsens agitation  . Statins Other (See Comments)    MUSCLE ACHES   Follow-up Information    Follow up with Purvis Kilts, MD. Schedule an  appointment as soon as possible for a visit in 1 week.   Specialty:  Family Medicine   Why:  Hospital follow up   Contact information:   [address] [place]  [address] [phone]       Follow up with Ardis Hughs, MD. Schedule an appointment as soon as possible for a visit in 6 weeks.   Specialty:  Urology   Why:  Hospital follow up, Bladder cancer   Contact information:   [place] [address] [phone]        The results of significant diagnostics from this hospitalization (including imaging, microbiology, ancillary and laboratory) are listed below for reference.    Significant Diagnostic Studies: Dg Chest 2 View  06/12/2014   CLINICAL DATA:  Acute onset of altered mental status. Initial encounter.  EXAM: CHEST  2 VIEW  COMPARISON:  Chest radiograph performed 01/28/2014  FINDINGS: The lungs are well-aerated. Mild peribronchial thickening is noted. There is no evidence of focal opacification, pleural effusion or pneumothorax.  The heart is borderline normal in size. The patient is status post median sternotomy. No acute osseous abnormalities are seen.  IMPRESSION: Mild peribronchial thickening noted; lungs otherwise clear.   Electronically Signed   By: Garald Balding M.D.   On: 06/12/2014 20:41   Ct Head Wo Contrast  06/12/2014   CLINICAL DATA:  Confusion. Hallucination. Recent nephrectomy. Bladder cancer. Mild dementia.  EXAM: CT HEAD WITHOUT CONTRAST  TECHNIQUE: Contiguous axial images were obtained from the base of the skull through the vertex without intravenous contrast.  COMPARISON:  01/23/2014  FINDINGS: Sinuses/Soft tissues: Motion degradation. Mucosal thickening of bilateral maxillary sinuses. Fluid in the right frontal sinus. Clear mastoid air cells.  Intracranial: Mild low density in the periventricular white matter likely related to small vessel disease. Cerebral volume loss which is normal for age. No mass lesion, hemorrhage, hydrocephalus,  acute infarct, intra-axial, or extra-axial fluid collection.  IMPRESSION: 1.  No acute intracranial abnormality. 2. Sinus disease. 3. Mild small vessel ischemic change.   Electronically Signed   By: Abigail Miyamoto M.D.   On: 06/12/2014 20:27   Mr Brain Wo Contrast  06/22/2014   CLINICAL DATA:  Mild-to-moderate dementia, transitional cell carcinoma. Confusion and agitation. Initial encounter.  EXAM: MRI HEAD WITHOUT CONTRAST  TECHNIQUE: Multiplanar, multiecho pulse sequences of the brain and surrounding structures were obtained without intravenous contrast.  COMPARISON:  CT head 06/12/2014. MR head 01/23/2014. MR head 07/07/2010.  FINDINGS: There is a tiny LEFT frontal scalp foreign body which casts metallic artifact. Overall the study is diagnostic.  No evidence for acute infarction, hemorrhage, mass lesion, hydrocephalus, or extra-axial fluid. Moderately advanced cerebral and cerebellar atrophy. Mild-to-moderate T2 and  FLAIR hyperintensities throughout periventricular and subcortical white matter consistent with chronic microvascular ischemic change. Flow voids are maintained throughout the carotid, basilar, and vertebral arteries. There are no areas of chronic hemorrhage. Pituitary, pineal, and cerebellar tonsils unremarkable. No upper cervical lesions. Visualized calvarium, skull base, and upper cervical osseous structures unremarkable. Scalp and extracranial soft tissues, orbits, sinuses, and mastoids show no acute process. BILATERAL cataract extraction. Layering nasopharyngeal fluid.  In the medial RIGHT temporal lobe/uncus, there is a subcentimeter cystic structure of doubtful significance. No surrounding inflammatory change. No prolonged FLAIR signal. This likely represents an incidental neuroepithelial cyst. This abnormality stable from prior MR in 2012 and 2015.  Within limits for detection on noncontrast exam, no intracranial metastatic disease is evident.  IMPRESSION: Generalized atrophy.  No acute  intracranial findings.   Electronically Signed   By: Rolla Flatten M.D.   On: 06/22/2014 13:39   Dg Cystogram  06/18/2014   CLINICAL DATA:  Status post right nephroureterectomy with excision of right ureteral orifice, evaluate for leak  EXAM: CYSTOGRAM  TECHNIQUE: After catheterization of the urinary bladder following sterile technique the bladder was filled with 150 mL Cysto-Hypaque 30% by drip infusion. Serial spot images were obtained during bladder filling and post draining.  FLUOROSCOPY TIME:  1 minutes 32 seconds  COMPARISON:  None.  FINDINGS: Surgical clips overlying the right abdomen/ pelvis.  Right lateral bladder is mildly irregular.  No evidence of bladder leak.  Reflux of contrast into the left ureter to the level the left proximal collecting system.  Postvoid/drainage image is within normal limits.  IMPRESSION: Postsurgical changes related to prior right nephroureterectomy.  No evidence of bladder leak.   Electronically Signed   By: Julian Hy M.D.   On: 06/18/2014 15:55   US Renal  06/13/2014   CLINICAL DATA:  Elevated BUN/creatinine.  EXAM: RENAL/URINARY TRACT ULTRASOUND COMPLETE  COMPARISON:  CT 03/13/2014  FINDINGS: Right Kidney:  Surgically absent  Left Kidney: 12.0 cm. Normal renal cortical thickness for age. No hydronephrosis. Left renal collecting system stone or stones. Most well-defined measures 5 mm in the inter/lower pole region.  Bladder: Per clinical history, the patient has a Foley catheter. It is not visualized in the bladder, and the bladder is not decompressed.  IMPRESSION: 1. Status post right nephrectomy. 2. No evidence of left-sided hydronephrosis. At least 1 left renal collecting system calculus identified. 3. Foley catheter not identified within the urinary bladder. Bladder is not decompressed. Correlate with position and function of Foley catheter. These results will be called to the ordering clinician or representative by the Radiologist Assistant, and communication  documented in the PACS or zVision Dashboard.   Electronically Signed   By: Abigail Miyamoto M.D.   On: 06/13/2014 12:29    Microbiology: Recent Results (from the past 240 hour(s))  Urine culture     Status: None   Collection Time: 06/12/14  6:45 PM  Result Value Ref Range Status   Specimen Description URINE, CATHETERIZED  Final   Special Requests NONE  Final   Colony Count NO GROWTH Performed at [facility]   Final   Culture NO GROWTH Performed at [facility]   Final   Report Status 06/15/2014 FINAL  Final  Blood culture (routine x 2)     Status: None   Collection Time: 06/12/14  9:25 PM  Result Value Ref Range Status   Specimen Description BLOOD LEFT ARM  Final   Special Requests BOTTLES DRAWN AEROBIC AND ANAEROBIC 6CC  Final   Culture NO GROWTH 5 DAYS  Final   Report Status 06/17/2014 FINAL  Final  Blood culture (routine x 2)     Status: None   Collection Time: 06/12/14  9:30 PM  Result Value Ref Range Status   Specimen Description BLOOD LEFT HAND  Final   Special Requests BOTTLES DRAWN AEROBIC AND ANAEROBIC 6CC  Final   Culture NO GROWTH 5 DAYS  Final   Report Status 06/17/2014 FINAL  Final  Urine culture     Status: None   Collection Time: 06/13/14  9:50 AM  Result Value Ref Range Status   Specimen Description URINE, CATHETERIZED  Final   Special Requests NONE  Final   Colony Count NO GROWTH Performed at [facility]   Final   Culture NO GROWTH Performed at [facility]   Final   Report Status 06/14/2014 FINAL  Final     Labs: Basic Metabolic Panel:  Recent Labs Lab 06/18/14 0440 06/19/14 0520 06/20/14 0520  NA 140 145 143  K 3.3* 3.9 4.1  CL 106 107 107  CO2 29 30 27   GLUCOSE 110* 91 84  BUN 19 16 13   CREATININE 1.78* 1.67* 1.68*  CALCIUM 8.6 9.0 9.3   Liver Function Tests: No results for input(s): AST, ALT, ALKPHOS, BILITOT, PROT, ALBUMIN in the last 168 hours. No results for input(s): LIPASE, AMYLASE in the last 168  hours. No results for input(s): AMMONIA in the last 168 hours. CBC:  Recent Labs Lab 06/18/14 0440 06/20/14 0520  WBC 10.2 8.3  HGB 11.0* 10.7*  HCT 34.9* 33.5*  MCV 90.6 90.3  PLT 272 309   Cardiac Enzymes: No results for input(s): CKTOTAL, CKMB, CKMBINDEX, TROPONINI in the last 168 hours. BNP: BNP (last 3 results) No results for input(s): BNP in the last 8760 hours.  ProBNP (last 3 results) No results for input(s): PROBNP in the last 8760 hours.  CBG:  Recent Labs Lab 06/22/14 1034 06/22/14 1224  GLUCAP 87 89       Signed:  Farah Benish  [facility] 06/22/2014, 3:09 PM

## 2014-06-22 NOTE — Progress Notes (Signed)
Clinical Social Work  CSW rounded with psych MD to visit with patient. RN reports patient at [facility] for MRI. CSW will follow up at later time.  [place], [place] [phone]

## 2014-06-22 NOTE — Progress Notes (Signed)
The [employer] and the Medical Necessity papers were printed for [facility] to transport patient to Short Stay for Anesthesia so that the MRI can be completed.

## 2014-06-22 NOTE — Progress Notes (Signed)
PT Cancellation Note  ___Treatment cancelled today due to medical issues with patient which prohibited   therapy  _X_ Treatment cancelled today due to patient receiving procedure or test ..........[name] MRI with anesthesia  ___ Treatment cancelled today due to patient's refusal to participate   ___ Treatment cancelled today due to  Rica Koyanagi  PTA [facility]  Acute  Rehab Pager      [phone]

## 2014-06-22 NOTE — Transfer of Care (Signed)
Immediate Anesthesia Transfer of Care Note  Patient: [name] [name]  Procedure(s) Performed: Procedure(s): MRI BRAIN W/O CONTRAST (N/A)  Patient Location: PACU  Anesthesia Type:General  Level of Consciousness: lethargic and responds to stimulation  Airway & Oxygen Therapy: Patient Spontanous Breathing  Post-op Assessment: Report given to RN  Post vital signs: Reviewed and stable  Last Vitals:  Filed Vitals:   06/22/14 1220  BP:   Pulse:   Temp: 36.2 C  Resp:     Complications: No apparent anesthesia complications

## 2014-06-22 NOTE — Anesthesia Procedure Notes (Signed)
Procedure Name: Intubation Date/Time: 06/22/2014 11:23 AM Performed by: Sampson Si E Pre-anesthesia Checklist: Patient identified, Emergency Drugs available, Suction available, Patient being monitored and Timeout performed Patient Re-evaluated:Patient Re-evaluated prior to inductionOxygen Delivery Method: Circle system utilized Preoxygenation: Pre-oxygenation with 100% oxygen Intubation Type: IV induction, Rapid sequence and Cricoid Pressure applied Laryngoscope Size: Mac and 4 Grade View: Grade I Tube type: Oral Tube size: 7.5 mm Number of attempts: 1 Airway Equipment and Method: Stylet Placement Confirmation: ETT inserted through vocal cords under direct vision,  positive ETCO2 and breath sounds checked- equal and bilateral Secured at: 22 cm Tube secured with: Tape Dental Injury: Teeth and Oropharynx as per pre-operative assessment

## 2014-06-22 NOTE — Progress Notes (Signed)
SLP Cancellation Note  Patient Details Name: [name] [name] MRN: [id number] DOB: 1933-11-08   Cancelled treatment:       Reason Eval/Treat Not Completed: Other (comment) (pt npo and in MRI per chart review)   Luanna Salk, [facility] SLP [phone]

## 2014-06-22 NOTE — Progress Notes (Signed)
[facility]                                                                              Patient Demographics  [name] [name], is a 81 y.o. male, DOB - 1934/02/18, RKY:[id number]  Admit date - (Not on file)   Admitting Physician No admitting provider for patient encounter.  Outpatient Primary MD for the patient is Purvis Kilts, MD  LOS -    No chief complaint on file.     HPI on 06/12/2014 by Dr. Derrill Kay 81 yo male h/o mild to mod dementia, s/p cabg end of 2015, s/p nephrectomy for TCC last week at [facility], ckd comes in with family for confusion and agitation today. Was d/c from [place] about 4 days ago. Has not really been eating or drinking well. Not been complaining of any pain but has not been sleeping well either. No fevers. No n/v/d. Has foley cath in since surgery, family thought there may be something wrong with it so they changed it today, and has been having good uop. He has fallen several times since hes been home and is generally getting weaker. He was on abx perioperatively but not sent home on any as his cultures were all negative. He had most recent urine culture this past Monday which family reports was negative. No focal neurological deficits. No swelling in his legs. Still has staples to surgical sites on abdomen which have been well healing, no discharge, no other rashes. Asked to admit for his delirium/encephalopathy. No coughing. No sob.  Assessment & Plan  Toxic Metabolic Encephalopathy with Delirium -Waxes and wanes -Delirium worsens at night -Likely complicated by polypharmacy vs hypoxia vs insomnia vs constipation -Status post right laparoscopic nephroureterectomy and cystoscopy -Patient has been off of restraints and haldol, started on geodon 06/20/2014 -Ammonia 12, TSH 13.484 -Urine cultures were negative, blood cultures negative -Psychiatry consulted -Ceftriaxone discontinued  -Neurology consulted and appreciated- it seems upon  evaluation 3/10, patient was having visual problems, however this seems to have subsided.  MRI of the brain was ordered, cancelled by one son, and wanted by another.  Patient's family understand that due to patient's inability to stay still, MRI would have to be done under general anesthesia.  Sons agreed.  -MRI brain with anesthesia today- likely to [facility] -Palliative care consulted and GREATLY appreciated- pt started on tylenol, fentanyl, avoid restraints  Demand ischemia with elevated troponin -Troponin trending downward, likely secondary to the above, no further workup  History of CABG -On 12/17/2013 -Currently not on beta blocker due to orthostasis, no ACE inhibitor due to renal failure -Continue aspirin 325 mg daily  Hypothyroidism -TSH 13.484, continue synthroid  Polypharmacy -Psychiatry consulted- patient does not need inpatient psych -Psychiatry titrating meds, Zyprexa 7.5mg  BID, lexapro  Acute renal insufficiency with mild hypokalemia -Upon admission, creatinine was 2.15 -Baseline 1.2, currently 1.68 -Continue to monitor BMP  Orthostatic hypotension -Midodrine and Florinef held- due to soft pressures  Obstructive sleep apnea -Unable to tolerate C-peptide night due to confusion  Transitional cell carcinoma -S/p laparoscopic nephroureterectomy -Urology following appreciated -Cystogram 3/10- no bladder leak -foley catheter removed 3/11  Normocytic Anemia -Will continue to monitor CBC -Hemoglobin stable,  currently 10.7  Malnutrition -Speech consulted and patient placed on Dysphagia 1 diet -Pending nutrition evaluation -Continue feeding supplements  Constipation -Continue bowel regimen  Code Status: DNR (Spoke with son [name], who provided living will and agreed with DNR status)  Family Communication:Caregiver at bedside. Son via phone.  Disposition Plan: Admitted.  Pending MRI on today, 3/14.  Time Spent in minutes   30 minutes  Procedures  Renal  US  Consults   Urology Psychiatry Neurology  DVT Prophylaxis  SCDs  Lab Results  Component Value Date   PLT 309 06/20/2014    Medications  Scheduled Meds:  Continuous Infusions:  PRN Meds:.  Antibiotics    Anti-infectives    None        Subjective:   [name] [name] seen and examined today.  Patient currently awake and alert.  Has no complaints today.  He denies chest pain, SOB.   Objective:   There were no vitals filed for this visit.  Wt Readings from Last 3 Encounters:  06/22/14 95.4 kg (210 lb 5.1 oz)  06/05/14 107.049 kg (236 lb)  04/22/14 106.142 kg (234 lb)     Intake/Output Summary (Last 24 hours) at 06/22/14 1029 Last data filed at 06/21/14 2204  Gross per 24 hour  Intake    357 ml  Output      0 ml  Net    357 ml    Exam  General: Well developed  HEENT: NCAT, mucous membranes dry  Cardiovascular: S1 S2 auscultated, RRR  Respiratory: Clear to auscultation   Abdomen: Soft, nontender, nondistended, + bowel sounds  Neuro: Awake, alert, oriented X2.    Data Review   Micro Results Recent Results (from the past 240 hour(s))  Urine culture     Status: None   Collection Time: 06/12/14  6:45 PM  Result Value Ref Range Status   Specimen Description URINE, CATHETERIZED  Final   Special Requests NONE  Final   Colony Count NO GROWTH Performed at [facility]   Final   Culture NO GROWTH Performed at [facility]   Final   Report Status 06/15/2014 FINAL  Final  Blood culture (routine x 2)     Status: None   Collection Time: 06/12/14  9:25 PM  Result Value Ref Range Status   Specimen Description BLOOD LEFT ARM  Final   Special Requests BOTTLES DRAWN AEROBIC AND ANAEROBIC 6CC  Final   Culture NO GROWTH 5 DAYS  Final   Report Status 06/17/2014 FINAL  Final  Blood culture (routine x 2)     Status: None   Collection Time: 06/12/14  9:30 PM  Result Value Ref Range Status   Specimen Description BLOOD LEFT HAND  Final    Special Requests BOTTLES DRAWN AEROBIC AND ANAEROBIC 6CC  Final   Culture NO GROWTH 5 DAYS  Final   Report Status 06/17/2014 FINAL  Final  Urine culture     Status: None   Collection Time: 06/13/14  9:50 AM  Result Value Ref Range Status   Specimen Description URINE, CATHETERIZED  Final   Special Requests NONE  Final   Colony Count NO GROWTH Performed at [facility]   Final   Culture NO GROWTH Performed at [facility]   Final   Report Status 06/14/2014 FINAL  Final    Radiology Reports Dg Chest 2 View  06/12/2014   CLINICAL DATA:  Acute onset of altered mental status. Initial encounter.  EXAM: CHEST  2  VIEW  COMPARISON:  Chest radiograph performed 01/28/2014  FINDINGS: The lungs are well-aerated. Mild peribronchial thickening is noted. There is no evidence of focal opacification, pleural effusion or pneumothorax.  The heart is borderline normal in size. The patient is status post median sternotomy. No acute osseous abnormalities are seen.  IMPRESSION: Mild peribronchial thickening noted; lungs otherwise clear.   Electronically Signed   By: Garald Balding M.D.   On: 06/12/2014 20:41   Ct Head Wo Contrast  06/12/2014   CLINICAL DATA:  Confusion. Hallucination. Recent nephrectomy. Bladder cancer. Mild dementia.  EXAM: CT HEAD WITHOUT CONTRAST  TECHNIQUE: Contiguous axial images were obtained from the base of the skull through the vertex without intravenous contrast.  COMPARISON:  01/23/2014  FINDINGS: Sinuses/Soft tissues: Motion degradation. Mucosal thickening of bilateral maxillary sinuses. Fluid in the right frontal sinus. Clear mastoid air cells.  Intracranial: Mild low density in the periventricular white matter likely related to small vessel disease. Cerebral volume loss which is normal for age. No mass lesion, hemorrhage, hydrocephalus, acute infarct, intra-axial, or extra-axial fluid collection.  IMPRESSION: 1.  No acute intracranial abnormality. 2. Sinus disease. 3. Mild  small vessel ischemic change.   Electronically Signed   By: Abigail Miyamoto M.D.   On: 06/12/2014 20:27   Dg Cystogram  06/18/2014   CLINICAL DATA:  Status post right nephroureterectomy with excision of right ureteral orifice, evaluate for leak  EXAM: CYSTOGRAM  TECHNIQUE: After catheterization of the urinary bladder following sterile technique the bladder was filled with 150 mL Cysto-Hypaque 30% by drip infusion. Serial spot images were obtained during bladder filling and post draining.  FLUOROSCOPY TIME:  1 minutes 32 seconds  COMPARISON:  None.  FINDINGS: Surgical clips overlying the right abdomen/ pelvis.  Right lateral bladder is mildly irregular.  No evidence of bladder leak.  Reflux of contrast into the left ureter to the level the left proximal collecting system.  Postvoid/drainage image is within normal limits.  IMPRESSION: Postsurgical changes related to prior right nephroureterectomy.  No evidence of bladder leak.   Electronically Signed   By: Julian Hy M.D.   On: 06/18/2014 15:55   US Renal  06/13/2014   CLINICAL DATA:  Elevated BUN/creatinine.  EXAM: RENAL/URINARY TRACT ULTRASOUND COMPLETE  COMPARISON:  CT 03/13/2014  FINDINGS: Right Kidney:  Surgically absent  Left Kidney: 12.0 cm. Normal renal cortical thickness for age. No hydronephrosis. Left renal collecting system stone or stones. Most well-defined measures 5 mm in the inter/lower pole region.  Bladder: Per clinical history, the patient has a Foley catheter. It is not visualized in the bladder, and the bladder is not decompressed.  IMPRESSION: 1. Status post right nephrectomy. 2. No evidence of left-sided hydronephrosis. At least 1 left renal collecting system calculus identified. 3. Foley catheter not identified within the urinary bladder. Bladder is not decompressed. Correlate with position and function of Foley catheter. These results will be called to the ordering clinician or representative by the Radiologist Assistant, and  communication documented in the PACS or zVision Dashboard.   Electronically Signed   By: Abigail Miyamoto M.D.   On: 06/13/2014 12:29    CBC  Recent Labs Lab 06/18/14 0440 06/20/14 0520  WBC 10.2 8.3  HGB 11.0* 10.7*  HCT 34.9* 33.5*  PLT 272 309  MCV 90.6 90.3  MCH 28.6 28.8  MCHC 31.5 31.9  RDW 15.7* 15.6*    Chemistries   Recent Labs Lab 06/18/14 0440 06/19/14 0520 06/20/14 0520  NA 140 145 143  K 3.3* 3.9 4.1  CL 106 107 107  CO2 29 30 27   GLUCOSE 110* 91 84  BUN 19 16 13   CREATININE 1.78* 1.67* 1.68*  CALCIUM 8.6 9.0 9.3   ------------------------------------------------------------------------------------------------------------------ estimated creatinine clearance is 42 mL/min (by C-G formula based on Cr of 1.68). ------------------------------------------------------------------------------------------------------------------ No results for input(s): HGBA1C in the last 72 hours. ------------------------------------------------------------------------------------------------------------------ No results for input(s): CHOL, HDL, LDLCALC, TRIG, CHOLHDL, LDLDIRECT in the last 72 hours. ------------------------------------------------------------------------------------------------------------------ No results for input(s): TSH, T4TOTAL, T3FREE, THYROIDAB in the last 72 hours.  Invalid input(s): FREET3 ------------------------------------------------------------------------------------------------------------------ No results for input(s): VITAMINB12, FOLATE, FERRITIN, TIBC, IRON, RETICCTPCT in the last 72 hours.  Coagulation profile No results for input(s): INR, PROTIME in the last 168 hours.  No results for input(s): DDIMER in the last 72 hours.  Cardiac Enzymes No results for input(s): CKMB, TROPONINI, MYOGLOBIN in the last 168 hours.  Invalid input(s):  CK ------------------------------------------------------------------------------------------------------------------ Invalid input(s): POCBNP    [name] [name] D.O. on 06/22/2014 at 10:29 AM  Between 7am to 7pm - Pager - [phone]  After 7pm go to [email or web address] - password [email or web address]  And look for the night coverage person covering for me after hours  [facility] Office  [phone]

## 2014-06-22 NOTE — Anesthesia Postprocedure Evaluation (Signed)
  Anesthesia Post-op Note  Patient: [name] [name]  Procedure(s) Performed: Procedure(s): MRI BRAIN W/O CONTRAST (N/A)  Patient Location: PACU  Anesthesia Type:General  Level of Consciousness: awake and alert   Airway and Oxygen Therapy: Patient Spontanous Breathing and Patient connected to nasal cannula oxygen  Post-op Pain: none  Post-op Assessment: Post-op Vital signs reviewed and Patient's Cardiovascular Status Stable  Post-op Vital Signs: Reviewed and stable  Last Vitals:  Filed Vitals:   06/22/14 1235  BP: 121/58  Pulse: 56  Temp:   Resp: 15    Complications: No apparent anesthesia complications

## 2014-06-22 NOTE — Progress Notes (Signed)
Patient is set to discharge to [facility] today. Patient & son, [name] aware. CSW confirmed with patient's family at bedside as well. Discharge packet given to RN, Gregary Signs. [facility] called for transport.   Clinical Social Work Department CLINICAL SOCIAL WORK PLACEMENT NOTE 06/22/2014  Patient:  [name],[name] [name]  Account Number:  [id number] Admit date:  06/12/2014  Clinical Social Worker:  Renold Genta  Date/time:  06/19/2014 12:09 PM  Clinical Social Work is seeking post-discharge placement for this patient at the following level of care:   SKILLED NURSING   (*CSW will update this form in Epic as items are completed)   06/19/2014  Patient/family provided with [facility] Department of Clinical Social Work's list of facilities offering this level of care within the geographic area requested by the patient (or if unable, by the patient's family).  06/19/2014  Patient/family informed of their freedom to choose among providers that offer the needed level of care, that participate in Medicare, Medicaid or managed care program needed by the patient, have an available bed and are willing to accept the patient.  06/19/2014  Patient/family informed of [facility]' ownership interest in [facility], as well as of the fact that they are under no obligation to receive care at this facility.  PASARR submitted to EDS on 06/19/2014 PASARR number received on 06/19/2014  FL2 transmitted to all facilities in geographic area requested by pt/family on  06/19/2014 FL2 transmitted to all facilities within larger geographic area on   Patient informed that his/her managed care company has contracts with or will negotiate with  certain facilities, including the following:     Patient/family informed of bed offers received:  06/22/2014 Patient chooses bed at [place] Physician recommends and patient chooses bed at    Patient to be transferred to [place] on   06/22/2014 Patient to be transferred to facility by [facility] Patient and family notified of transfer on 06/22/2014 Name of family member notified:  patient's son, [name] via phone  The following physician request were entered in Epic:   Additional Comments:     Raynaldo Opitz, [place] Social Worker cell #: [phone]

## 2014-06-23 ENCOUNTER — Encounter (HOSPITAL_COMMUNITY): Payer: Self-pay | Admitting: Radiology

## 2014-06-23 DIAGNOSIS — N183 Chronic kidney disease, stage 3 (moderate): Secondary | ICD-10-CM | POA: Diagnosis not present

## 2014-06-23 DIAGNOSIS — E039 Hypothyroidism, unspecified: Secondary | ICD-10-CM | POA: Diagnosis not present

## 2014-06-23 DIAGNOSIS — G4733 Obstructive sleep apnea (adult) (pediatric): Secondary | ICD-10-CM | POA: Diagnosis not present

## 2014-06-23 DIAGNOSIS — N400 Enlarged prostate without lower urinary tract symptoms: Secondary | ICD-10-CM | POA: Diagnosis not present

## 2014-06-24 DIAGNOSIS — E875 Hyperkalemia: Secondary | ICD-10-CM | POA: Diagnosis not present

## 2014-06-24 DIAGNOSIS — G4733 Obstructive sleep apnea (adult) (pediatric): Secondary | ICD-10-CM | POA: Diagnosis not present

## 2014-06-24 DIAGNOSIS — E039 Hypothyroidism, unspecified: Secondary | ICD-10-CM | POA: Diagnosis not present

## 2014-06-24 DIAGNOSIS — E559 Vitamin D deficiency, unspecified: Secondary | ICD-10-CM | POA: Diagnosis not present

## 2014-06-24 DIAGNOSIS — N183 Chronic kidney disease, stage 3 (moderate): Secondary | ICD-10-CM | POA: Diagnosis not present

## 2014-06-28 DIAGNOSIS — E039 Hypothyroidism, unspecified: Secondary | ICD-10-CM | POA: Diagnosis not present

## 2014-06-28 DIAGNOSIS — E559 Vitamin D deficiency, unspecified: Secondary | ICD-10-CM | POA: Diagnosis not present

## 2014-06-28 DIAGNOSIS — G4733 Obstructive sleep apnea (adult) (pediatric): Secondary | ICD-10-CM | POA: Diagnosis not present

## 2014-06-28 DIAGNOSIS — E875 Hyperkalemia: Secondary | ICD-10-CM | POA: Diagnosis not present

## 2014-06-28 DIAGNOSIS — N183 Chronic kidney disease, stage 3 (moderate): Secondary | ICD-10-CM | POA: Diagnosis not present

## 2014-06-30 DIAGNOSIS — E875 Hyperkalemia: Secondary | ICD-10-CM | POA: Diagnosis not present

## 2014-06-30 DIAGNOSIS — Z638 Other specified problems related to primary support group: Secondary | ICD-10-CM | POA: Diagnosis not present

## 2014-06-30 DIAGNOSIS — Z681 Body mass index (BMI) 19 or less, adult: Secondary | ICD-10-CM | POA: Diagnosis not present

## 2014-06-30 DIAGNOSIS — N183 Chronic kidney disease, stage 3 (moderate): Secondary | ICD-10-CM | POA: Diagnosis not present

## 2014-06-30 DIAGNOSIS — F0150 Vascular dementia without behavioral disturbance: Secondary | ICD-10-CM | POA: Diagnosis not present

## 2014-06-30 DIAGNOSIS — C649 Malignant neoplasm of unspecified kidney, except renal pelvis: Secondary | ICD-10-CM | POA: Diagnosis not present

## 2014-06-30 DIAGNOSIS — I951 Orthostatic hypotension: Secondary | ICD-10-CM | POA: Diagnosis not present

## 2014-07-08 DIAGNOSIS — E875 Hyperkalemia: Secondary | ICD-10-CM | POA: Diagnosis not present

## 2014-07-08 DIAGNOSIS — I951 Orthostatic hypotension: Secondary | ICD-10-CM | POA: Diagnosis not present

## 2014-07-08 DIAGNOSIS — G4733 Obstructive sleep apnea (adult) (pediatric): Secondary | ICD-10-CM | POA: Diagnosis not present

## 2014-07-08 DIAGNOSIS — N183 Chronic kidney disease, stage 3 (moderate): Secondary | ICD-10-CM | POA: Diagnosis not present

## 2014-07-14 DIAGNOSIS — E875 Hyperkalemia: Secondary | ICD-10-CM | POA: Diagnosis not present

## 2014-07-14 DIAGNOSIS — R51 Headache: Secondary | ICD-10-CM | POA: Diagnosis not present

## 2014-07-14 DIAGNOSIS — I951 Orthostatic hypotension: Secondary | ICD-10-CM | POA: Diagnosis not present

## 2014-07-14 DIAGNOSIS — F068 Other specified mental disorders due to known physiological condition: Secondary | ICD-10-CM | POA: Diagnosis not present

## 2014-07-14 DIAGNOSIS — N183 Chronic kidney disease, stage 3 (moderate): Secondary | ICD-10-CM | POA: Diagnosis not present

## 2014-07-15 NOTE — Discharge Summary (Signed)
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 Palliative care encounter   . Obstructive uropathy   . Urinary tract infectious disease   . Blindness   . NSTEMI (non-ST elevated myocardial infarction)   . Renal insufficiency   . S/p nephrectomy 06/12/2014  . Acute renal failure 06/12/2014  . UTI (lower urinary tract infection) 06/12/2014  . Metabolic encephalopathy 70/04/7492  . Elevated troponin 06/12/2014  . Altered mental state 06/12/2014  . Altered mental status   . Transitional cell carcinoma of kidney 06/05/2014  . TIA (transient ischemic attack) 01/23/2014  . Dementia without behavioral disturbance 01/23/2014  . Prolonged Q-T interval on ECG 01/23/2014  . DM type 2 (diabetes mellitus, type 2) 01/23/2014  . Anemia, post op 01/13/2014  . CAD, multiple vessel 12/18/2013  . S/P CABG x 4 12/18/2013  . ST elevation myocardial infarction (STEMI) of inferior wall, initial episode of care 12/17/2013  . Hypoxemia 11/19/2013  . Orthostatic hypotension 11/19/2013  . Dysphagia, unspecified(787.20) 12/12/2012  . Dizziness 11/07/2012  . Hyperlipidemia 11/07/2012  . Obstructive sleep apnea 11/07/2012    History and Physical: For full details, please see admission history and physical. Briefly, [name] [name] is a 81 y.o. year old patient with right ureteral/renal pelvis tumor.   Hospital Course: Patient tolerated the procedure well.  He was then transferred to the floor after an uneventful PACU stay.  His hospital course was uncomplicated.  On POD#3  he had met discharge criteria: was eating a regular diet, was up and ambulating independently,  pain was well controlled, was voiding without a catheter, and was ready to for discharge.   Laboratory values:  No results for input(s): WBC,  HGB, HCT in the last 72 hours. No results for input(s): NA, K, CL, CO2, GLUCOSE, BUN, CREATININE, CALCIUM in the last 72 hours. No results for input(s): LABPT, INR in the last 72 hours. No results for input(s): LABURIN in the last 72 hours. Results for orders placed or performed during the hospital encounter of 06/01/14  Urine culture     Status: None   Collection Time: 06/01/14 12:15 PM  Result Value Ref Range Status   Specimen Description URINE, RANDOM  Final   Special Requests NONE  Final   Colony Count NO GROWTH Performed at [facility]   Final   Culture NO GROWTH Performed at [facility]   Final   Report Status 06/02/2014 FINAL  Final    Disposition: Home  Discharge instruction: The patient was instructed to be ambulatory but told to refrain from heavy lifting, strenuous activity, or driving.   Discharge medications:    Medication List    STOP taking these medications        escitalopram 10 MG tablet  Commonly known as:  LEXAPRO     fludrocortisone 0.1 MG tablet  Commonly known as:  FLORINEF     midodrine 10 MG tablet  Commonly known as:  PROAMATINE      TAKE these medications        aspirin 325 MG EC tablet  Take 1 tablet (325 mg total) by mouth daily.     donepezil 10 MG tablet  Commonly known as:  ARICEPT  Take 10 mg by mouth at bedtime.     levothyroxine 125 MCG tablet  Commonly  known as:  SYNTHROID, LEVOTHROID  Take 125 mcg by mouth daily before breakfast.     pantoprazole 40 MG tablet  Commonly known as:  PROTONIX  TAKE ONE TABLET EVERY MORNING     polyethylene glycol packet  Commonly known as:  MIRALAX / GLYCOLAX  Take 17 g by mouth at bedtime.     solifenacin 5 MG tablet  Commonly known as:  VESICARE  Take 5 mg by mouth daily as needed (TAKES AT BEDTIME AS NEEDED FOR URINATION).        Followup:      Follow-up Information    Follow up with Ardis Hughs, MD On 06/15/2014.   Specialty:  Urology   Why:  For  wound re-check at 9:30am   Contact information:   [place] [address] [phone]       Follow up with [facility].   Why:  HHPT   Contact information:   [address] [place]  [address] [phone]

## 2014-07-16 DIAGNOSIS — C641 Malignant neoplasm of right kidney, except renal pelvis: Secondary | ICD-10-CM | POA: Diagnosis not present

## 2014-07-21 DIAGNOSIS — N183 Chronic kidney disease, stage 3 (moderate): Secondary | ICD-10-CM | POA: Diagnosis not present

## 2014-07-21 DIAGNOSIS — E039 Hypothyroidism, unspecified: Secondary | ICD-10-CM | POA: Diagnosis not present

## 2014-07-21 DIAGNOSIS — N400 Enlarged prostate without lower urinary tract symptoms: Secondary | ICD-10-CM | POA: Diagnosis not present

## 2014-07-21 DIAGNOSIS — G4733 Obstructive sleep apnea (adult) (pediatric): Secondary | ICD-10-CM | POA: Diagnosis not present

## 2014-07-23 DIAGNOSIS — I252 Old myocardial infarction: Secondary | ICD-10-CM | POA: Diagnosis not present

## 2014-07-23 DIAGNOSIS — M6281 Muscle weakness (generalized): Secondary | ICD-10-CM | POA: Diagnosis not present

## 2014-07-23 DIAGNOSIS — Z48815 Encounter for surgical aftercare following surgery on the digestive system: Secondary | ICD-10-CM | POA: Diagnosis not present

## 2014-07-23 DIAGNOSIS — E039 Hypothyroidism, unspecified: Secondary | ICD-10-CM | POA: Diagnosis not present

## 2014-07-23 DIAGNOSIS — R269 Unspecified abnormalities of gait and mobility: Secondary | ICD-10-CM | POA: Diagnosis not present

## 2014-07-23 DIAGNOSIS — I2510 Atherosclerotic heart disease of native coronary artery without angina pectoris: Secondary | ICD-10-CM | POA: Diagnosis not present

## 2014-07-23 DIAGNOSIS — Z951 Presence of aortocoronary bypass graft: Secondary | ICD-10-CM | POA: Diagnosis not present

## 2014-07-23 DIAGNOSIS — Z483 Aftercare following surgery for neoplasm: Secondary | ICD-10-CM | POA: Diagnosis not present

## 2014-07-27 DIAGNOSIS — Z951 Presence of aortocoronary bypass graft: Secondary | ICD-10-CM | POA: Diagnosis not present

## 2014-07-27 DIAGNOSIS — E039 Hypothyroidism, unspecified: Secondary | ICD-10-CM | POA: Diagnosis not present

## 2014-07-27 DIAGNOSIS — M6281 Muscle weakness (generalized): Secondary | ICD-10-CM | POA: Diagnosis not present

## 2014-07-27 DIAGNOSIS — I2510 Atherosclerotic heart disease of native coronary artery without angina pectoris: Secondary | ICD-10-CM | POA: Diagnosis not present

## 2014-07-27 DIAGNOSIS — I252 Old myocardial infarction: Secondary | ICD-10-CM | POA: Diagnosis not present

## 2014-07-27 DIAGNOSIS — Z483 Aftercare following surgery for neoplasm: Secondary | ICD-10-CM | POA: Diagnosis not present

## 2014-07-27 DIAGNOSIS — Z48815 Encounter for surgical aftercare following surgery on the digestive system: Secondary | ICD-10-CM | POA: Diagnosis not present

## 2014-07-27 DIAGNOSIS — R269 Unspecified abnormalities of gait and mobility: Secondary | ICD-10-CM | POA: Diagnosis not present

## 2014-07-28 DIAGNOSIS — E039 Hypothyroidism, unspecified: Secondary | ICD-10-CM | POA: Diagnosis not present

## 2014-07-28 DIAGNOSIS — R269 Unspecified abnormalities of gait and mobility: Secondary | ICD-10-CM | POA: Diagnosis not present

## 2014-07-28 DIAGNOSIS — M6281 Muscle weakness (generalized): Secondary | ICD-10-CM | POA: Diagnosis not present

## 2014-07-28 DIAGNOSIS — I252 Old myocardial infarction: Secondary | ICD-10-CM | POA: Diagnosis not present

## 2014-07-28 DIAGNOSIS — Z951 Presence of aortocoronary bypass graft: Secondary | ICD-10-CM | POA: Diagnosis not present

## 2014-07-28 DIAGNOSIS — I2510 Atherosclerotic heart disease of native coronary artery without angina pectoris: Secondary | ICD-10-CM | POA: Diagnosis not present

## 2014-07-28 DIAGNOSIS — Z483 Aftercare following surgery for neoplasm: Secondary | ICD-10-CM | POA: Diagnosis not present

## 2014-07-28 DIAGNOSIS — Z48815 Encounter for surgical aftercare following surgery on the digestive system: Secondary | ICD-10-CM | POA: Diagnosis not present

## 2014-07-29 DIAGNOSIS — E039 Hypothyroidism, unspecified: Secondary | ICD-10-CM | POA: Diagnosis not present

## 2014-07-29 DIAGNOSIS — Z48815 Encounter for surgical aftercare following surgery on the digestive system: Secondary | ICD-10-CM | POA: Diagnosis not present

## 2014-07-29 DIAGNOSIS — M6281 Muscle weakness (generalized): Secondary | ICD-10-CM | POA: Diagnosis not present

## 2014-07-29 DIAGNOSIS — R269 Unspecified abnormalities of gait and mobility: Secondary | ICD-10-CM | POA: Diagnosis not present

## 2014-07-29 DIAGNOSIS — Z483 Aftercare following surgery for neoplasm: Secondary | ICD-10-CM | POA: Diagnosis not present

## 2014-07-29 DIAGNOSIS — I2510 Atherosclerotic heart disease of native coronary artery without angina pectoris: Secondary | ICD-10-CM | POA: Diagnosis not present

## 2014-07-29 DIAGNOSIS — I252 Old myocardial infarction: Secondary | ICD-10-CM | POA: Diagnosis not present

## 2014-07-29 DIAGNOSIS — Z951 Presence of aortocoronary bypass graft: Secondary | ICD-10-CM | POA: Diagnosis not present

## 2014-07-31 ENCOUNTER — Emergency Department (HOSPITAL_COMMUNITY): Payer: Medicare Other

## 2014-07-31 ENCOUNTER — Emergency Department (HOSPITAL_COMMUNITY)
Admission: EM | Admit: 2014-07-31 | Discharge: 2014-07-31 | Disposition: A | Discharge status: Home / Self Care | Payer: Medicare Other | Attending: Emergency Medicine | Admitting: Emergency Medicine

## 2014-07-31 ENCOUNTER — Encounter (HOSPITAL_COMMUNITY): Payer: Self-pay | Admitting: Emergency Medicine

## 2014-07-31 DIAGNOSIS — Z8551 Personal history of malignant neoplasm of bladder: Secondary | ICD-10-CM | POA: Diagnosis not present

## 2014-07-31 DIAGNOSIS — K219 Gastro-esophageal reflux disease without esophagitis: Secondary | ICD-10-CM | POA: Insufficient documentation

## 2014-07-31 DIAGNOSIS — Z85528 Personal history of other malignant neoplasm of kidney: Secondary | ICD-10-CM | POA: Diagnosis not present

## 2014-07-31 DIAGNOSIS — W1839XA Other fall on same level, initial encounter: Secondary | ICD-10-CM | POA: Diagnosis not present

## 2014-07-31 DIAGNOSIS — Y9389 Activity, other specified: Secondary | ICD-10-CM | POA: Diagnosis not present

## 2014-07-31 DIAGNOSIS — G4733 Obstructive sleep apnea (adult) (pediatric): Secondary | ICD-10-CM | POA: Diagnosis not present

## 2014-07-31 DIAGNOSIS — E119 Type 2 diabetes mellitus without complications: Secondary | ICD-10-CM | POA: Insufficient documentation

## 2014-07-31 DIAGNOSIS — Y9289 Other specified places as the place of occurrence of the external cause: Secondary | ICD-10-CM | POA: Insufficient documentation

## 2014-07-31 DIAGNOSIS — I2510 Atherosclerotic heart disease of native coronary artery without angina pectoris: Secondary | ICD-10-CM | POA: Insufficient documentation

## 2014-07-31 DIAGNOSIS — S82831A Other fracture of upper and lower end of right fibula, initial encounter for closed fracture: Secondary | ICD-10-CM | POA: Insufficient documentation

## 2014-07-31 DIAGNOSIS — M79671 Pain in right foot: Secondary | ICD-10-CM | POA: Diagnosis not present

## 2014-07-31 DIAGNOSIS — Z8546 Personal history of malignant neoplasm of prostate: Secondary | ICD-10-CM | POA: Diagnosis not present

## 2014-07-31 DIAGNOSIS — S8261XA Displaced fracture of lateral malleolus of right fibula, initial encounter for closed fracture: Secondary | ICD-10-CM | POA: Diagnosis not present

## 2014-07-31 DIAGNOSIS — Y998 Other external cause status: Secondary | ICD-10-CM | POA: Diagnosis not present

## 2014-07-31 DIAGNOSIS — S99911A Unspecified injury of right ankle, initial encounter: Secondary | ICD-10-CM | POA: Diagnosis present

## 2014-07-31 DIAGNOSIS — K5900 Constipation, unspecified: Secondary | ICD-10-CM | POA: Diagnosis not present

## 2014-07-31 DIAGNOSIS — M6281 Muscle weakness (generalized): Secondary | ICD-10-CM | POA: Diagnosis not present

## 2014-07-31 DIAGNOSIS — Z7982 Long term (current) use of aspirin: Secondary | ICD-10-CM | POA: Insufficient documentation

## 2014-07-31 DIAGNOSIS — M1990 Unspecified osteoarthritis, unspecified site: Secondary | ICD-10-CM | POA: Insufficient documentation

## 2014-07-31 DIAGNOSIS — Z9981 Dependence on supplemental oxygen: Secondary | ICD-10-CM | POA: Insufficient documentation

## 2014-07-31 DIAGNOSIS — Z951 Presence of aortocoronary bypass graft: Secondary | ICD-10-CM | POA: Insufficient documentation

## 2014-07-31 DIAGNOSIS — E039 Hypothyroidism, unspecified: Secondary | ICD-10-CM | POA: Insufficient documentation

## 2014-07-31 DIAGNOSIS — S99921A Unspecified injury of right foot, initial encounter: Secondary | ICD-10-CM | POA: Diagnosis not present

## 2014-07-31 DIAGNOSIS — N183 Chronic kidney disease, stage 3 (moderate): Secondary | ICD-10-CM | POA: Insufficient documentation

## 2014-07-31 DIAGNOSIS — I259 Chronic ischemic heart disease, unspecified: Secondary | ICD-10-CM | POA: Diagnosis not present

## 2014-07-31 DIAGNOSIS — Z79899 Other long term (current) drug therapy: Secondary | ICD-10-CM | POA: Insufficient documentation

## 2014-07-31 DIAGNOSIS — F0390 Unspecified dementia without behavioral disturbance: Secondary | ICD-10-CM | POA: Diagnosis not present

## 2014-07-31 DIAGNOSIS — S82891A Other fracture of right lower leg, initial encounter for closed fracture: Secondary | ICD-10-CM

## 2014-07-31 DIAGNOSIS — M7989 Other specified soft tissue disorders: Secondary | ICD-10-CM | POA: Diagnosis not present

## 2014-07-31 DIAGNOSIS — Z87891 Personal history of nicotine dependence: Secondary | ICD-10-CM | POA: Diagnosis not present

## 2014-07-31 NOTE — ED Provider Notes (Signed)
CSN: [id number]     Arrival date & time 07/31/14  1554 History   First MD Initiated Contact with Patient 07/31/14 1616     Chief Complaint  Patient presents with  . Fall     (Consider location/radiation/quality/duration/timing/severity/associated sxs/prior Treatment) Patient is a 81 y.o. male presenting with fall. The history is provided by the patient (the pt fell and twisted his right ankle).  Fall This is a new problem. The current episode started 12 to 24 hours ago. The problem occurs constantly. The problem has not changed since onset.Pertinent negatives include no chest pain, no abdominal pain and no headaches. Nothing aggravates the symptoms. Nothing relieves the symptoms.    Past Medical History  Diagnosis Date  . Chronic constipation   . GERD (gastroesophageal reflux disease)   . Arthritis   . Hyperlipidemia   . Orthostatic hypotension 11/19/2013  . Coronary artery disease     CARDIOLOGIST-  DR BERRY  --  HX  STEMI  12-17-2013  S/P  CABG X4  12-18-2013  . Mild dementia   . Type 2 diabetes mellitus   . Hypothyroidism   . History of esophageal dilatation   . History of gastritis   . History of hiatal hernia   . History of bladder cancer     hx  TCC low grade  . Prostate cancer     low-grade  . Kidney tumor     right  . BPH (benign prostatic hyperplasia)   . CKD (chronic kidney disease) stage 3, GFR 30-59 ml/min   . History of kidney stones   . ED (erectile dysfunction)   . S/P CABG x 4     12-18-2013  . Bladder tumor   . Complication of anesthesia     HARD TO WAKE  . OSA on CPAP     mild osa   per sleep study 07-11-2009- patient states does not use regularly   Past Surgical History  Procedure Laterality Date  . Upper gastrointestinal endoscopy  04/06/2010  &  01-08-2013    w/ esophageal dilatation  . Hemorrhoid surgery    . Hand surgery      right  . Wrist fracture surgery      pins placed-left  . Cystoscopy with biopsy  01/09/2012    Procedure:  CYSTOSCOPY WITH BIOPSY;  Surgeon: Marissa Nestle, MD;  Location: [facility];  Service: Urology;  Laterality: N/A;  Cystoscopy with Multiple Bladder Biopsies  . Esophagogastroduodenoscopy (egd) with esophageal dilation N/A 01/08/2013    Procedure: ESOPHAGOGASTRODUODENOSCOPY (EGD) WITH ESOPHAGEAL DILATION;  Surgeon: Rogene Houston, MD;  Location: [facility];  Service: Endoscopy;  Laterality: N/A;  120  . Left heart cath N/A 12/17/2013    Procedure: LEFT HEART CATH;  Surgeon: Leonie Man, MD;  Location: [facility] CATH LAB;  Service: Cardiovascular;  Laterality: N/A;  . Left heart catheterization with coronary angiogram N/A 12/17/2013    Procedure: LEFT HEART CATHETERIZATION WITH CORONARY ANGIOGRAM;  Surgeon: Leonie Man, MD;  Location: [facility] CATH LAB;  Service: Cardiovascular;  Laterality: N/A;  . Colonoscopy with esophagogastroduodenoscopy (egd)  08-15-2006    w/ esophageal dilatation  . Umbilical hernia repair  04-24-2003  . Transurethral resection of bladder tumor  06-14-2004  . Transurethral resection of prostate  10-04-2004  . Cysto/  removal bladder calculus/  resection bladder tumor  10-16-2006  . Cystostomy w/ bladder biopsy  01-09-2012  . Cardiac catheterization  12-17-2013   dr Shanon Brow harding    severe disease  RCA with thrombotic appearing subtotal occlusions/  95-99%  OM2/  diffuse disease LAD  and D1/  mild to moderate basal to mid inferior hypokinesis/  severe elevated LVEDP/  ef 45-50%  . Coronary artery bypass graft N/A 12/18/2013    Procedure: Coronary artery bypass grafting times four using left internal mammary artery and endoscopically harvested right saphenous vein graft;  Surgeon: Gaye Pollack, MD;  Location: [facility] OR;  Service: Open Heart Surgery;  Laterality: N/A;  . Transthoracic echocardiogram  11-14-2012    grade I diastolic dysfunction/  ef 55-60%/  trivial MR  and TR  . Cardiovascular stress test  08-09-2009  dr berry    mild to moderate perfusion defect in basal , mid, and  apical inferior regions consistent with an infart/scar/   No evidence ischemia/  ef 51%/  no ECG changes/   low risk scan  . Transurethral resection of bladder tumor N/A 04/07/2014    Procedure: TRANSURETHRAL RESECTION OF BLADDER TUMOR (TURBT);  Surgeon: Arvil Persons, MD;  Location: [facility];  Service: Urology;  Laterality: N/A;  . Cystoscopy/retrograde/ureteroscopy Right 04/07/2014    Procedure: CYSTOSCOPY/RETROGRADE/URETEROSCOPY WITH BIOPSY OF RENAL PELVIS TUMOR;  Surgeon: Arvil Persons, MD;  Location[facility];  Service: Urology;  Laterality: Right;  . Robot assisted laparoscopic nephrectomy Right 06/05/2014    Procedure: ROBOTIC ASSISTED LAPAROSCOPIC RIGHT NEPHROURETERECTOMY, VENTRAL HERNIA REPAIR ;  Surgeon: Ardis Hughs, MD;  Location: [facility];  Service: Urology;  Laterality: Right;  . Cystoscopy w/ ureteral stent placement Right 06/05/2014    Procedure: CYSTOSCOPY ;  Surgeon: Ardis Hughs, MD;  Location: [facility];  Service: Urology;  Laterality: Right;  . Radiology with anesthesia N/A 06/22/2014    Procedure: MRI BRAIN W/O CONTRAST;  Surgeon: Medication Radiologist, MD;  Location: [place];  Service: Radiology;  Laterality: N/A;   Family History  Problem Relation Age of Onset  . Cancer - Lung Brother    History  Substance Use Topics  . Smoking status: Former Smoker -- 1.00 packs/day for 4 years    Types: Cigarettes  . Smokeless tobacco: Former Systems developer    Types: [place] date: 01/03/1960  . Alcohol Use: No    Review of Systems  Constitutional: Negative for appetite change and fatigue.  HENT: Negative for congestion, ear discharge and sinus pressure.   Eyes: Negative for discharge.  Respiratory: Negative for cough.   Cardiovascular: Negative for chest pain.  Gastrointestinal: Negative for abdominal pain and diarrhea.  Genitourinary: Negative for frequency and hematuria.  Musculoskeletal: Negative for back pain.       Pain swelling right  ankle  Skin: Negative for rash.  Neurological: Negative for seizures and headaches.  Psychiatric/Behavioral: Negative for hallucinations.      Allergies  Lorazepam and Statins  Home Medications   Prior to Admission medications   Medication Sig Start Date End Date Taking? Authorizing Provider  acetaminophen (TYLENOL) 500 MG tablet Take 2 tablets (1,000 mg total) by mouth 3 (three) times daily. 06/22/14   Maryann Mikhail, DO  aspirin EC 325 MG EC tablet Take 1 tablet (325 mg total) by mouth daily. 12/24/13   Donielle Liston Alba, PA-C  bisacodyl (DULCOLAX) 10 MG suppository Place 1 suppository (10 mg total) rectally daily as needed for moderate constipation. 06/22/14   Maryann Mikhail, DO  donepezil (ARICEPT) 10 MG tablet Take 10 mg by mouth at bedtime.    Historical Provider, MD  escitalopram (LEXAPRO) 20 MG  tablet Take 20 mg by mouth daily.    Historical Provider, MD  feeding supplement, ENSURE COMPLETE, (ENSURE COMPLETE) LIQD Take 237 mLs by mouth 3 (three) times daily between meals. 06/22/14   Maryann Mikhail, DO  levothyroxine (SYNTHROID, LEVOTHROID) 125 MCG tablet Take 125 mcg by mouth daily before breakfast.    Historical Provider, MD  menthol-cetylpyridinium (CEPACOL) 3 MG lozenge Take 1 lozenge (3 mg total) by mouth as needed for sore throat. 06/22/14   Maryann Mikhail, DO  pantoprazole (PROTONIX) 40 MG tablet TAKE ONE TABLET EVERY MORNING 04/06/14   Butch Penny, NP  polyethylene glycol (MIRALAX / GLYCOLAX) packet Take 17 g by mouth at bedtime.    Historical Provider, MD  senna-docusate (SENOKOT-S) 8.6-50 MG per tablet Take 2 tablets by mouth at bedtime. 06/22/14   Maryann Mikhail, DO  solifenacin (VESICARE) 5 MG tablet Take 5 mg by mouth daily as needed (TAKES AT BEDTIME AS NEEDED FOR URINATION).     Historical Provider, MD  ziprasidone (GEODON) 20 MG capsule Take 1 capsule (20 mg total) by mouth 2 (two) times daily with a meal. 06/22/14   Maryann Mikhail, DO   BP 74/56 mmHg  Pulse  58  Temp(Src) 97.7 F (36.5 C) (Oral)  Resp 16  Ht 5\' 10"  (1.778 m)  Wt 215 lb (97.523 kg)  BMI 30.85 kg/m2  SpO2 98% Physical Exam  Constitutional: He is oriented to person, place, and time. He appears well-developed.  HENT:  Head: Normocephalic.  Eyes: Conjunctivae and EOM are normal. No scleral icterus.  Neck: Neck supple. No thyromegaly present.  Cardiovascular: Normal rate and regular rhythm.  Exam reveals no gallop and no friction rub.   No murmur heard. Pulmonary/Chest: No stridor. He has no wheezes. He has no rales. He exhibits no tenderness.  Abdominal: He exhibits no distension. There is no tenderness. There is no rebound.  Musculoskeletal: He exhibits edema and tenderness.  Mild tender swollen right ankle  Lymphadenopathy:    He has no cervical adenopathy.  Neurological: He is oriented to person, place, and time. He exhibits normal muscle tone. Coordination normal.  Skin: No rash noted. No erythema.  Psychiatric: He has a normal mood and affect. His behavior is normal.    ED Course  Procedures (including critical care time) Labs Review Labs Reviewed - No data to display  Imaging Review Dg Ankle Complete Right  07/31/2014   CLINICAL DATA:  Right ankle pain and swelling  EXAM: RIGHT ANKLE - COMPLETE 3+ VIEW  COMPARISON:  None.  FINDINGS: Considerable soft tissue swelling is noted laterally. Minimally displaced fracture of the distal fibula is noted. No tibial fracture is seen. A small plantar spur is noted.  IMPRESSION: Mildly displaced distal fibular fracture with associated soft tissue changes   Electronically Signed   By: Inez Catalina M.D.   On: 07/31/2014 17:00   Dg Foot Complete Right  07/31/2014   CLINICAL DATA:  Pain and paresthesias involving the right foot and ankle associated with swelling which began earlier today. Dizziness and fall. Initial encounter.  EXAM: RIGHT FOOT COMPLETE - 3+ VIEW  COMPARISON:  None.  FINDINGS: Osseous demineralization. No acute  fractures involving the bones of the foot. Mild narrowing of joint spaces in the midfoot. Mild narrowing of the 1st MTP joint space with associated hypertrophic spurring. Soft tissue swelling involving the dorsum of the foot. Small to moderate-sized plantar calcaneal spur.  IMPRESSION: No acute osseous abnormality. Osseous demineralization. Mild osteoarthritis involving the midfoot and the  1st MTP joint.   Electronically Signed   By: Evangeline Dakin M.D.   On: 07/31/2014 17:01     EKG Interpretation None    family states pts bp is always low  MDM   Final diagnoses:  Ankle fracture, right, closed, initial encounter    Cam walker    Milton [name], MD 07/31/14 [id number]

## 2014-07-31 NOTE — ED Notes (Signed)
Pt fell this am, bruising and edema to R foot and ankle.

## 2014-07-31 NOTE — Discharge Instructions (Signed)
Follow u with dr. Luna Glasgow next week

## 2014-08-03 DIAGNOSIS — Z6831 Body mass index (BMI) 31.0-31.9, adult: Secondary | ICD-10-CM | POA: Diagnosis not present

## 2014-08-03 DIAGNOSIS — I959 Hypotension, unspecified: Secondary | ICD-10-CM | POA: Diagnosis not present

## 2014-08-03 DIAGNOSIS — E6609 Other obesity due to excess calories: Secondary | ICD-10-CM | POA: Diagnosis not present

## 2014-08-03 DIAGNOSIS — E039 Hypothyroidism, unspecified: Secondary | ICD-10-CM | POA: Diagnosis not present

## 2014-08-03 DIAGNOSIS — R269 Unspecified abnormalities of gait and mobility: Secondary | ICD-10-CM | POA: Diagnosis not present

## 2014-08-03 DIAGNOSIS — M6281 Muscle weakness (generalized): Secondary | ICD-10-CM | POA: Diagnosis not present

## 2014-08-03 DIAGNOSIS — Z951 Presence of aortocoronary bypass graft: Secondary | ICD-10-CM | POA: Diagnosis not present

## 2014-08-03 DIAGNOSIS — D519 Vitamin B12 deficiency anemia, unspecified: Secondary | ICD-10-CM | POA: Diagnosis not present

## 2014-08-03 DIAGNOSIS — I252 Old myocardial infarction: Secondary | ICD-10-CM | POA: Diagnosis not present

## 2014-08-03 DIAGNOSIS — I2510 Atherosclerotic heart disease of native coronary artery without angina pectoris: Secondary | ICD-10-CM | POA: Diagnosis not present

## 2014-08-03 DIAGNOSIS — Z483 Aftercare following surgery for neoplasm: Secondary | ICD-10-CM | POA: Diagnosis not present

## 2014-08-03 DIAGNOSIS — E063 Autoimmune thyroiditis: Secondary | ICD-10-CM | POA: Diagnosis not present

## 2014-08-03 DIAGNOSIS — Z48815 Encounter for surgical aftercare following surgery on the digestive system: Secondary | ICD-10-CM | POA: Diagnosis not present

## 2014-08-05 DIAGNOSIS — E039 Hypothyroidism, unspecified: Secondary | ICD-10-CM | POA: Diagnosis not present

## 2014-08-05 DIAGNOSIS — I2510 Atherosclerotic heart disease of native coronary artery without angina pectoris: Secondary | ICD-10-CM | POA: Diagnosis not present

## 2014-08-05 DIAGNOSIS — S82301A Unspecified fracture of lower end of right tibia, initial encounter for closed fracture: Secondary | ICD-10-CM | POA: Diagnosis not present

## 2014-08-05 DIAGNOSIS — R269 Unspecified abnormalities of gait and mobility: Secondary | ICD-10-CM | POA: Diagnosis not present

## 2014-08-05 DIAGNOSIS — M6281 Muscle weakness (generalized): Secondary | ICD-10-CM | POA: Diagnosis not present

## 2014-08-05 DIAGNOSIS — N19 Unspecified kidney failure: Secondary | ICD-10-CM | POA: Diagnosis not present

## 2014-08-05 DIAGNOSIS — I252 Old myocardial infarction: Secondary | ICD-10-CM | POA: Diagnosis not present

## 2014-08-05 DIAGNOSIS — Z951 Presence of aortocoronary bypass graft: Secondary | ICD-10-CM | POA: Diagnosis not present

## 2014-08-05 DIAGNOSIS — Z48815 Encounter for surgical aftercare following surgery on the digestive system: Secondary | ICD-10-CM | POA: Diagnosis not present

## 2014-08-05 DIAGNOSIS — I509 Heart failure, unspecified: Secondary | ICD-10-CM | POA: Diagnosis not present

## 2014-08-05 DIAGNOSIS — M25571 Pain in right ankle and joints of right foot: Secondary | ICD-10-CM | POA: Diagnosis not present

## 2014-08-05 DIAGNOSIS — F328 Other depressive episodes: Secondary | ICD-10-CM | POA: Diagnosis not present

## 2014-08-05 DIAGNOSIS — Z483 Aftercare following surgery for neoplasm: Secondary | ICD-10-CM | POA: Diagnosis not present

## 2014-08-06 DIAGNOSIS — R269 Unspecified abnormalities of gait and mobility: Secondary | ICD-10-CM | POA: Diagnosis not present

## 2014-08-06 DIAGNOSIS — I2510 Atherosclerotic heart disease of native coronary artery without angina pectoris: Secondary | ICD-10-CM | POA: Diagnosis not present

## 2014-08-06 DIAGNOSIS — M6281 Muscle weakness (generalized): Secondary | ICD-10-CM | POA: Diagnosis not present

## 2014-08-06 DIAGNOSIS — Z483 Aftercare following surgery for neoplasm: Secondary | ICD-10-CM | POA: Diagnosis not present

## 2014-08-06 DIAGNOSIS — I252 Old myocardial infarction: Secondary | ICD-10-CM | POA: Diagnosis not present

## 2014-08-06 DIAGNOSIS — Z951 Presence of aortocoronary bypass graft: Secondary | ICD-10-CM | POA: Diagnosis not present

## 2014-08-06 DIAGNOSIS — E039 Hypothyroidism, unspecified: Secondary | ICD-10-CM | POA: Diagnosis not present

## 2014-08-06 DIAGNOSIS — Z48815 Encounter for surgical aftercare following surgery on the digestive system: Secondary | ICD-10-CM | POA: Diagnosis not present

## 2014-08-10 DIAGNOSIS — M6281 Muscle weakness (generalized): Secondary | ICD-10-CM | POA: Diagnosis not present

## 2014-08-10 DIAGNOSIS — E039 Hypothyroidism, unspecified: Secondary | ICD-10-CM | POA: Diagnosis not present

## 2014-08-10 DIAGNOSIS — Z951 Presence of aortocoronary bypass graft: Secondary | ICD-10-CM | POA: Diagnosis not present

## 2014-08-10 DIAGNOSIS — I2510 Atherosclerotic heart disease of native coronary artery without angina pectoris: Secondary | ICD-10-CM | POA: Diagnosis not present

## 2014-08-10 DIAGNOSIS — Z483 Aftercare following surgery for neoplasm: Secondary | ICD-10-CM | POA: Diagnosis not present

## 2014-08-10 DIAGNOSIS — I252 Old myocardial infarction: Secondary | ICD-10-CM | POA: Diagnosis not present

## 2014-08-10 DIAGNOSIS — R269 Unspecified abnormalities of gait and mobility: Secondary | ICD-10-CM | POA: Diagnosis not present

## 2014-08-10 DIAGNOSIS — Z48815 Encounter for surgical aftercare following surgery on the digestive system: Secondary | ICD-10-CM | POA: Diagnosis not present

## 2014-08-11 DIAGNOSIS — Z48815 Encounter for surgical aftercare following surgery on the digestive system: Secondary | ICD-10-CM | POA: Diagnosis not present

## 2014-08-11 DIAGNOSIS — I2510 Atherosclerotic heart disease of native coronary artery without angina pectoris: Secondary | ICD-10-CM | POA: Diagnosis not present

## 2014-08-11 DIAGNOSIS — R269 Unspecified abnormalities of gait and mobility: Secondary | ICD-10-CM | POA: Diagnosis not present

## 2014-08-11 DIAGNOSIS — Z483 Aftercare following surgery for neoplasm: Secondary | ICD-10-CM | POA: Diagnosis not present

## 2014-08-11 DIAGNOSIS — Z951 Presence of aortocoronary bypass graft: Secondary | ICD-10-CM | POA: Diagnosis not present

## 2014-08-11 DIAGNOSIS — M6281 Muscle weakness (generalized): Secondary | ICD-10-CM | POA: Diagnosis not present

## 2014-08-11 DIAGNOSIS — I252 Old myocardial infarction: Secondary | ICD-10-CM | POA: Diagnosis not present

## 2014-08-11 DIAGNOSIS — E039 Hypothyroidism, unspecified: Secondary | ICD-10-CM | POA: Diagnosis not present

## 2014-08-12 DIAGNOSIS — S82301D Unspecified fracture of lower end of right tibia, subsequent encounter for closed fracture with routine healing: Secondary | ICD-10-CM | POA: Diagnosis not present

## 2014-08-12 DIAGNOSIS — F328 Other depressive episodes: Secondary | ICD-10-CM | POA: Diagnosis not present

## 2014-08-12 DIAGNOSIS — I509 Heart failure, unspecified: Secondary | ICD-10-CM | POA: Diagnosis not present

## 2014-08-12 DIAGNOSIS — N19 Unspecified kidney failure: Secondary | ICD-10-CM | POA: Diagnosis not present

## 2014-08-13 DIAGNOSIS — I2510 Atherosclerotic heart disease of native coronary artery without angina pectoris: Secondary | ICD-10-CM | POA: Diagnosis not present

## 2014-08-13 DIAGNOSIS — M6281 Muscle weakness (generalized): Secondary | ICD-10-CM | POA: Diagnosis not present

## 2014-08-13 DIAGNOSIS — Z48815 Encounter for surgical aftercare following surgery on the digestive system: Secondary | ICD-10-CM | POA: Diagnosis not present

## 2014-08-13 DIAGNOSIS — E039 Hypothyroidism, unspecified: Secondary | ICD-10-CM | POA: Diagnosis not present

## 2014-08-13 DIAGNOSIS — Z483 Aftercare following surgery for neoplasm: Secondary | ICD-10-CM | POA: Diagnosis not present

## 2014-08-13 DIAGNOSIS — R269 Unspecified abnormalities of gait and mobility: Secondary | ICD-10-CM | POA: Diagnosis not present

## 2014-08-13 DIAGNOSIS — I252 Old myocardial infarction: Secondary | ICD-10-CM | POA: Diagnosis not present

## 2014-08-13 DIAGNOSIS — Z951 Presence of aortocoronary bypass graft: Secondary | ICD-10-CM | POA: Diagnosis not present

## 2014-08-14 DIAGNOSIS — Z483 Aftercare following surgery for neoplasm: Secondary | ICD-10-CM | POA: Diagnosis not present

## 2014-08-14 DIAGNOSIS — Z48815 Encounter for surgical aftercare following surgery on the digestive system: Secondary | ICD-10-CM | POA: Diagnosis not present

## 2014-08-14 DIAGNOSIS — I2510 Atherosclerotic heart disease of native coronary artery without angina pectoris: Secondary | ICD-10-CM | POA: Diagnosis not present

## 2014-08-14 DIAGNOSIS — I252 Old myocardial infarction: Secondary | ICD-10-CM | POA: Diagnosis not present

## 2014-08-14 DIAGNOSIS — R269 Unspecified abnormalities of gait and mobility: Secondary | ICD-10-CM | POA: Diagnosis not present

## 2014-08-14 DIAGNOSIS — E039 Hypothyroidism, unspecified: Secondary | ICD-10-CM | POA: Diagnosis not present

## 2014-08-14 DIAGNOSIS — M6281 Muscle weakness (generalized): Secondary | ICD-10-CM | POA: Diagnosis not present

## 2014-08-14 DIAGNOSIS — Z951 Presence of aortocoronary bypass graft: Secondary | ICD-10-CM | POA: Diagnosis not present

## 2014-08-17 ENCOUNTER — Inpatient Hospital Stay (HOSPITAL_COMMUNITY): Payer: Medicare Other

## 2014-08-17 ENCOUNTER — Emergency Department (HOSPITAL_COMMUNITY): Payer: Medicare Other

## 2014-08-17 ENCOUNTER — Encounter (HOSPITAL_COMMUNITY): Payer: Self-pay | Admitting: Cardiology

## 2014-08-17 ENCOUNTER — Inpatient Hospital Stay (HOSPITAL_COMMUNITY)
Admission: EM | Admit: 2014-08-17 | Discharge: 2014-08-20 | DRG: 291 | Disposition: A | Discharge status: Home Health | Payer: Medicare Other | Attending: Internal Medicine | Admitting: Internal Medicine

## 2014-08-17 DIAGNOSIS — R7989 Other specified abnormal findings of blood chemistry: Secondary | ICD-10-CM | POA: Diagnosis present

## 2014-08-17 DIAGNOSIS — I25708 Atherosclerosis of coronary artery bypass graft(s), unspecified, with other forms of angina pectoris: Secondary | ICD-10-CM

## 2014-08-17 DIAGNOSIS — Z6830 Body mass index (BMI) 30.0-30.9, adult: Secondary | ICD-10-CM

## 2014-08-17 DIAGNOSIS — N179 Acute kidney failure, unspecified: Secondary | ICD-10-CM | POA: Diagnosis present

## 2014-08-17 DIAGNOSIS — E039 Hypothyroidism, unspecified: Secondary | ICD-10-CM | POA: Diagnosis present

## 2014-08-17 DIAGNOSIS — M7989 Other specified soft tissue disorders: Secondary | ICD-10-CM

## 2014-08-17 DIAGNOSIS — Z8546 Personal history of malignant neoplasm of prostate: Secondary | ICD-10-CM

## 2014-08-17 DIAGNOSIS — G4733 Obstructive sleep apnea (adult) (pediatric): Secondary | ICD-10-CM | POA: Diagnosis present

## 2014-08-17 DIAGNOSIS — I951 Orthostatic hypotension: Secondary | ICD-10-CM | POA: Diagnosis not present

## 2014-08-17 DIAGNOSIS — N183 Chronic kidney disease, stage 3 unspecified: Secondary | ICD-10-CM

## 2014-08-17 DIAGNOSIS — I824Z9 Acute embolism and thrombosis of unspecified deep veins of unspecified distal lower extremity: Secondary | ICD-10-CM | POA: Diagnosis present

## 2014-08-17 DIAGNOSIS — I5020 Unspecified systolic (congestive) heart failure: Secondary | ICD-10-CM

## 2014-08-17 DIAGNOSIS — R0601 Orthopnea: Secondary | ICD-10-CM

## 2014-08-17 DIAGNOSIS — R0602 Shortness of breath: Secondary | ICD-10-CM | POA: Diagnosis not present

## 2014-08-17 DIAGNOSIS — I5021 Acute systolic (congestive) heart failure: Secondary | ICD-10-CM

## 2014-08-17 DIAGNOSIS — Z87891 Personal history of nicotine dependence: Secondary | ICD-10-CM

## 2014-08-17 DIAGNOSIS — Z951 Presence of aortocoronary bypass graft: Secondary | ICD-10-CM

## 2014-08-17 DIAGNOSIS — I252 Old myocardial infarction: Secondary | ICD-10-CM

## 2014-08-17 DIAGNOSIS — Z7982 Long term (current) use of aspirin: Secondary | ICD-10-CM

## 2014-08-17 DIAGNOSIS — I824Z1 Acute embolism and thrombosis of unspecified deep veins of right distal lower extremity: Secondary | ICD-10-CM | POA: Diagnosis not present

## 2014-08-17 DIAGNOSIS — R0600 Dyspnea, unspecified: Secondary | ICD-10-CM

## 2014-08-17 DIAGNOSIS — I2699 Other pulmonary embolism without acute cor pulmonale: Secondary | ICD-10-CM | POA: Diagnosis not present

## 2014-08-17 DIAGNOSIS — I248 Other forms of acute ischemic heart disease: Secondary | ICD-10-CM | POA: Diagnosis not present

## 2014-08-17 DIAGNOSIS — I5023 Acute on chronic systolic (congestive) heart failure: Secondary | ICD-10-CM | POA: Diagnosis not present

## 2014-08-17 DIAGNOSIS — I255 Ischemic cardiomyopathy: Secondary | ICD-10-CM | POA: Diagnosis not present

## 2014-08-17 DIAGNOSIS — E119 Type 2 diabetes mellitus without complications: Secondary | ICD-10-CM

## 2014-08-17 DIAGNOSIS — R778 Other specified abnormalities of plasma proteins: Secondary | ICD-10-CM | POA: Diagnosis present

## 2014-08-17 DIAGNOSIS — E43 Unspecified severe protein-calorie malnutrition: Secondary | ICD-10-CM | POA: Diagnosis not present

## 2014-08-17 DIAGNOSIS — I509 Heart failure, unspecified: Secondary | ICD-10-CM | POA: Diagnosis present

## 2014-08-17 DIAGNOSIS — I82441 Acute embolism and thrombosis of right tibial vein: Secondary | ICD-10-CM | POA: Diagnosis not present

## 2014-08-17 DIAGNOSIS — I82401 Acute embolism and thrombosis of unspecified deep veins of right lower extremity: Secondary | ICD-10-CM | POA: Diagnosis not present

## 2014-08-17 DIAGNOSIS — R296 Repeated falls: Secondary | ICD-10-CM | POA: Diagnosis not present

## 2014-08-17 DIAGNOSIS — F0390 Unspecified dementia without behavioral disturbance: Secondary | ICD-10-CM | POA: Diagnosis not present

## 2014-08-17 DIAGNOSIS — Z905 Acquired absence of kidney: Secondary | ICD-10-CM

## 2014-08-17 DIAGNOSIS — Z66 Do not resuscitate: Secondary | ICD-10-CM | POA: Diagnosis not present

## 2014-08-17 DIAGNOSIS — Z8551 Personal history of malignant neoplasm of bladder: Secondary | ICD-10-CM

## 2014-08-17 DIAGNOSIS — I5043 Acute on chronic combined systolic (congestive) and diastolic (congestive) heart failure: Secondary | ICD-10-CM | POA: Diagnosis not present

## 2014-08-17 DIAGNOSIS — I2510 Atherosclerotic heart disease of native coronary artery without angina pectoris: Secondary | ICD-10-CM | POA: Diagnosis present

## 2014-08-17 DIAGNOSIS — F0150 Vascular dementia without behavioral disturbance: Secondary | ICD-10-CM | POA: Diagnosis not present

## 2014-08-17 DIAGNOSIS — E785 Hyperlipidemia, unspecified: Secondary | ICD-10-CM | POA: Diagnosis not present

## 2014-08-17 DIAGNOSIS — I5033 Acute on chronic diastolic (congestive) heart failure: Secondary | ICD-10-CM | POA: Diagnosis not present

## 2014-08-17 DIAGNOSIS — K219 Gastro-esophageal reflux disease without esophagitis: Secondary | ICD-10-CM | POA: Diagnosis not present

## 2014-08-17 DIAGNOSIS — Z889 Allergy status to unspecified drugs, medicaments and biological substances status: Secondary | ICD-10-CM

## 2014-08-17 LAB — GLUCOSE, CAPILLARY
GLUCOSE-CAPILLARY: 108 mg/dL — AB (ref 70–99)
Glucose-Capillary: 135 mg/dL — ABNORMAL HIGH (ref 70–99)

## 2014-08-17 LAB — URINALYSIS, ROUTINE W REFLEX MICROSCOPIC
BILIRUBIN URINE: NEGATIVE
Glucose, UA: NEGATIVE mg/dL
KETONES UR: NEGATIVE mg/dL
Nitrite: NEGATIVE
PROTEIN: NEGATIVE mg/dL
SPECIFIC GRAVITY, URINE: 1.02 (ref 1.005–1.030)
UROBILINOGEN UA: 0.2 mg/dL (ref 0.0–1.0)
pH: 5 (ref 5.0–8.0)

## 2014-08-17 LAB — BRAIN NATRIURETIC PEPTIDE: B Natriuretic Peptide: 2588 pg/mL — ABNORMAL HIGH (ref 0.0–100.0)

## 2014-08-17 LAB — COMPREHENSIVE METABOLIC PANEL
ALT: 40 U/L (ref 17–63)
AST: 29 U/L (ref 15–41)
Albumin: 3 g/dL — ABNORMAL LOW (ref 3.5–5.0)
Alkaline Phosphatase: 88 U/L (ref 38–126)
Anion gap: 8 (ref 5–15)
BILIRUBIN TOTAL: 0.5 mg/dL (ref 0.3–1.2)
BUN: 23 mg/dL — ABNORMAL HIGH (ref 6–20)
CALCIUM: 9.2 mg/dL (ref 8.9–10.3)
CHLORIDE: 108 mmol/L (ref 101–111)
CO2: 25 mmol/L (ref 22–32)
CREATININE: 2.03 mg/dL — AB (ref 0.61–1.24)
GFR calc non Af Amer: 30 mL/min — ABNORMAL LOW (ref >60)
GFR, EST AFRICAN AMERICAN: 34 mL/min — AB (ref >60)
GLUCOSE: 124 mg/dL — AB (ref 70–99)
Potassium: 4 mmol/L (ref 3.5–5.1)
Sodium: 141 mmol/L (ref 135–145)
Total Protein: 6.6 g/dL (ref 6.5–8.1)

## 2014-08-17 LAB — TROPONIN I
TROPONIN I: 1.28 ng/mL — AB (ref <0.031)
Troponin I: 1.04 ng/mL (ref <0.031)
Troponin I: 0.89 ng/mL (ref <0.031)

## 2014-08-17 LAB — CBC WITH DIFFERENTIAL/PLATELET
Basophils Absolute: 0 10*3/uL (ref 0.0–0.1)
Basophils Relative: 0 % (ref 0–1)
EOS PCT: 2 % (ref 0–5)
Eosinophils Absolute: 0.2 10*3/uL (ref 0.0–0.7)
HCT: 33.8 % — ABNORMAL LOW (ref 39.0–52.0)
Hemoglobin: 10.6 g/dL — ABNORMAL LOW (ref 13.0–17.0)
LYMPHS ABS: 1.3 10*3/uL (ref 0.7–4.0)
LYMPHS PCT: 13 % (ref 12–46)
MCH: 28.6 pg (ref 26.0–34.0)
MCHC: 31.4 g/dL (ref 30.0–36.0)
MCV: 91.1 fL (ref 78.0–100.0)
MONO ABS: 0.6 10*3/uL (ref 0.1–1.0)
Monocytes Relative: 6 % (ref 3–12)
Neutro Abs: 8.2 10*3/uL — ABNORMAL HIGH (ref 1.7–7.7)
Neutrophils Relative %: 79 % — ABNORMAL HIGH (ref 43–77)
PLATELETS: 408 10*3/uL — AB (ref 150–400)
RBC: 3.71 MIL/uL — ABNORMAL LOW (ref 4.22–5.81)
RDW: 15.3 % (ref 11.5–15.5)
WBC: 10.4 10*3/uL (ref 4.0–10.5)

## 2014-08-17 LAB — URINE MICROSCOPIC-ADD ON

## 2014-08-17 LAB — TSH: TSH: 5.346 u[IU]/mL — ABNORMAL HIGH (ref 0.350–4.500)

## 2014-08-17 MED ORDER — ENOXAPARIN SODIUM 40 MG/0.4ML ~~LOC~~ SOLN
40.0000 mg | Freq: Every day | SUBCUTANEOUS | Status: DC
  Administered 2014-08-17 – 2014-08-18 (×2): 40 mg via SUBCUTANEOUS
  Filled 2014-08-17 (×2): qty 0.4

## 2014-08-17 MED ORDER — NITROGLYCERIN 2 % TD OINT
1.0000 [in_us] | TOPICAL_OINTMENT | Freq: Once | TRANSDERMAL | Status: AC
  Administered 2014-08-17: 1 [in_us] via TOPICAL
  Filled 2014-08-17: qty 1

## 2014-08-17 MED ORDER — TRAZODONE HCL 50 MG PO TABS: 25.0000 mg | ORAL_TABLET | Freq: Every evening | ORAL | Status: DC | PRN

## 2014-08-17 MED ORDER — FLUDROCORTISONE ACETATE 0.1 MG PO TABS
0.1000 mg | ORAL_TABLET | Freq: Two times a day (BID) | ORAL | Status: DC
  Administered 2014-08-17 – 2014-08-20 (×5): 0.1 mg via ORAL
  Filled 2014-08-17 (×11): qty 1

## 2014-08-17 MED ORDER — DONEPEZIL HCL 5 MG PO TABS
10.0000 mg | ORAL_TABLET | Freq: Every day | ORAL | Status: DC
  Administered 2014-08-17 – 2014-08-19 (×3): 10 mg via ORAL
  Filled 2014-08-17 (×3): qty 2

## 2014-08-17 MED ORDER — INSULIN ASPART 100 UNIT/ML ~~LOC~~ SOLN: 0.0000 [IU] | Freq: Every day | SUBCUTANEOUS | Status: DC

## 2014-08-17 MED ORDER — ESCITALOPRAM OXALATE 10 MG PO TABS
20.0000 mg | ORAL_TABLET | Freq: Every day | ORAL | Status: DC
  Administered 2014-08-17 – 2014-08-20 (×4): 20 mg via ORAL
  Filled 2014-08-17 (×4): qty 2

## 2014-08-17 MED ORDER — ASPIRIN 81 MG PO CHEW
CHEWABLE_TABLET | ORAL | Status: AC
  Filled 2014-08-17: qty 4

## 2014-08-17 MED ORDER — FUROSEMIDE 10 MG/ML IJ SOLN
40.0000 mg | Freq: Two times a day (BID) | INTRAMUSCULAR | Status: DC
  Administered 2014-08-17 – 2014-08-19 (×4): 40 mg via INTRAVENOUS
  Filled 2014-08-17 (×4): qty 4

## 2014-08-17 MED ORDER — ASPIRIN EC 325 MG PO TBEC
325.0000 mg | DELAYED_RELEASE_TABLET | Freq: Every day | ORAL | Status: DC
  Administered 2014-08-18 – 2014-08-19 (×2): 325 mg via ORAL
  Filled 2014-08-17 (×2): qty 1

## 2014-08-17 MED ORDER — ASPIRIN 81 MG PO CHEW
324.0000 mg | CHEWABLE_TABLET | Freq: Once | ORAL | Status: AC
  Administered 2014-08-17: 324 mg via ORAL

## 2014-08-17 MED ORDER — PANTOPRAZOLE SODIUM 40 MG PO TBEC
40.0000 mg | DELAYED_RELEASE_TABLET | Freq: Every morning | ORAL | Status: DC
  Administered 2014-08-17 – 2014-08-20 (×4): 40 mg via ORAL
  Filled 2014-08-17 (×4): qty 1

## 2014-08-17 MED ORDER — INSULIN ASPART 100 UNIT/ML ~~LOC~~ SOLN
0.0000 [IU] | Freq: Three times a day (TID) | SUBCUTANEOUS | Status: DC
  Administered 2014-08-19 – 2014-08-20 (×2): 2 [IU] via SUBCUTANEOUS

## 2014-08-17 MED ORDER — ACETAMINOPHEN 325 MG PO TABS: 650.0000 mg | ORAL_TABLET | Freq: Four times a day (QID) | ORAL | Status: DC | PRN

## 2014-08-17 MED ORDER — ACETAMINOPHEN 650 MG RE SUPP
650.0000 mg | Freq: Four times a day (QID) | RECTAL | Status: DC | PRN
Start: 2014-08-17 — End: 2014-08-20

## 2014-08-17 MED ORDER — LEVOTHYROXINE SODIUM 25 MCG PO TABS
125.0000 ug | ORAL_TABLET | Freq: Every day | ORAL | Status: DC
  Administered 2014-08-18 – 2014-08-20 (×3): 125 ug via ORAL
  Filled 2014-08-17 (×6): qty 1

## 2014-08-17 MED ORDER — FLUDROCORTISONE ACETATE 0.1 MG PO TABS
ORAL_TABLET | ORAL | Status: AC
  Filled 2014-08-17: qty 1

## 2014-08-17 MED ORDER — SODIUM CHLORIDE 0.9 % IJ SOLN
3.0000 mL | Freq: Two times a day (BID) | INTRAMUSCULAR | Status: DC
  Administered 2014-08-17 – 2014-08-20 (×4): 3 mL via INTRAVENOUS

## 2014-08-17 MED ORDER — ONDANSETRON HCL 4 MG/2ML IJ SOLN: 4.0000 mg | Freq: Four times a day (QID) | INTRAMUSCULAR | Status: DC | PRN

## 2014-08-17 MED ORDER — POLYETHYLENE GLYCOL 3350 17 G PO PACK: 17.0000 g | PACK | Freq: Every day | ORAL | Status: DC | PRN

## 2014-08-17 MED ORDER — ENSURE ENLIVE PO LIQD
237.0000 mL | Freq: Three times a day (TID) | ORAL | Status: DC
  Administered 2014-08-17 – 2014-08-20 (×8): 237 mL via ORAL

## 2014-08-17 MED ORDER — MIDODRINE HCL 5 MG PO TABS
10.0000 mg | ORAL_TABLET | Freq: Two times a day (BID) | ORAL | Status: DC
  Administered 2014-08-17 – 2014-08-20 (×7): 10 mg via ORAL
  Filled 2014-08-17 (×7): qty 2

## 2014-08-17 MED ORDER — ENSURE ENLIVE PO LIQD
237.0000 mL | Freq: Two times a day (BID) | ORAL | Status: DC
  Administered 2014-08-17: 237 mL via ORAL

## 2014-08-17 MED ORDER — BISACODYL 10 MG RE SUPP: 10.0000 mg | Freq: Every day | RECTAL | Status: DC | PRN

## 2014-08-17 MED ORDER — MAGNESIUM CITRATE PO SOLN: 1.0000 | Freq: Once | ORAL | Status: AC | PRN

## 2014-08-17 MED ORDER — ONDANSETRON HCL 4 MG PO TABS: 4.0000 mg | ORAL_TABLET | Freq: Four times a day (QID) | ORAL | Status: DC | PRN

## 2014-08-17 MED ORDER — CETYLPYRIDINIUM CHLORIDE 0.05 % MT LIQD
7.0000 mL | Freq: Two times a day (BID) | OROMUCOSAL | Status: DC
  Administered 2014-08-17 – 2014-08-20 (×6): 7 mL via OROMUCOSAL

## 2014-08-17 MED ORDER — ZIPRASIDONE HCL 20 MG PO CAPS
20.0000 mg | ORAL_CAPSULE | Freq: Two times a day (BID) | ORAL | Status: DC
  Administered 2014-08-17 – 2014-08-20 (×6): 20 mg via ORAL
  Filled 2014-08-17 (×8): qty 1

## 2014-08-17 MED ORDER — SODIUM CHLORIDE 0.9 % IJ SOLN: 3.0000 mL | INTRAMUSCULAR | Status: DC | PRN

## 2014-08-17 MED ORDER — SODIUM CHLORIDE 0.9 % IV SOLN: 250.0000 mL | INTRAVENOUS | Status: DC | PRN

## 2014-08-17 MED ORDER — SODIUM CHLORIDE 0.9 % IJ SOLN
3.0000 mL | Freq: Two times a day (BID) | INTRAMUSCULAR | Status: DC
  Administered 2014-08-17 – 2014-08-19 (×3): 3 mL via INTRAVENOUS

## 2014-08-17 MED ORDER — FUROSEMIDE 10 MG/ML IJ SOLN
40.0000 mg | Freq: Once | INTRAMUSCULAR | Status: AC
  Administered 2014-08-17: 40 mg via INTRAVENOUS
  Filled 2014-08-17: qty 4

## 2014-08-17 MED ORDER — HYDROCODONE-ACETAMINOPHEN 5-325 MG PO TABS
1.0000 | ORAL_TABLET | ORAL | Status: DC | PRN
  Administered 2014-08-17: 1 via ORAL
  Administered 2014-08-17: 2 via ORAL
  Filled 2014-08-17: qty 2
  Filled 2014-08-17: qty 1

## 2014-08-17 MED ORDER — POLYETHYLENE GLYCOL 3350 17 G PO PACK
17.0000 g | PACK | Freq: Every day | ORAL | Status: DC
  Administered 2014-08-17 – 2014-08-19 (×3): 17 g via ORAL
  Filled 2014-08-17 (×3): qty 1

## 2014-08-17 MED ORDER — ALUM & MAG HYDROXIDE-SIMETH 200-200-20 MG/5ML PO SUSP: 30.0000 mL | Freq: Four times a day (QID) | ORAL | Status: DC | PRN

## 2014-08-17 NOTE — H&P (Signed)
[facility] History and Physical  [name] [name] TIW:[id number] DOB: Mar 25, 1934 DOA: 08/17/2014  Referring physician: zammit PCP: Purvis Kilts, MD   Chief Complaint: dyspnea  HPI: [name] [name] is a 81 y.o. male past medical history significant for, vascular dementia, STEMI status post cath 2015+ CABG, orthostatic hypotension, diabetes, bladder cancer, diabetes, sleep apnea on C Pap, hypothyroidism, chronic kidney disease stage III resents to the emergency department with the chief complaint of worsening shortness of breath and lower extremity edema and decreased urine output. Initial evaluation concerning for acute heart failure. Patient with history of dementia so information is somewhat unreliable. Caretaker at bedside reports gradual worsening of shortness of breath over the last 7 days. Associated symptoms include decreased urine output and intermittent diaphoresis and worsening lower extremity edema on the right. Patient with a history of sleeping propped up on 2 pillows. Been no reported chest pain palpitation, cough dizziness syncope or near-syncope. No abdominal pain nausea vomiting diarrhea constipation melena. No dysuria hematuria frequency or urgency. No fever chills or recent sick contacts. Workup in the emergency department includes comprehensive metabolic panel significant for creatinine of 2.03 serum glucose of 124, complete blood count with differential significant for hemoglobin of 10.6, platelets 408, troponin 1.28, BNP 2588. Chest x-ray bilateral lung opacity which could reflect multi focal pneumonia or asymmetric edema. EKG Sinus rhythm right axis deviation low voltage, precordial leads RSR' in V1 or V2, Abnormal lateral Q waves prolonged QT interval The time of my exam he is afebrile hemodynamically stable and not hypoxic. While in the emergency department he is given 40 mg of Lasix intravenously as well as 324 mg of aspirin and nitroglycerin paste.   Review of  Systems:  10 point review of systems complete and all systems are negative except as indicated in the history of present illness  Past Medical History  Diagnosis Date  . Chronic constipation   . GERD (gastroesophageal reflux disease)   . Arthritis   . Hyperlipidemia   . Orthostatic hypotension 11/19/2013  . Coronary artery disease     CARDIOLOGIST-  DR BERRY  --  HX  STEMI  12-17-2013  S/P  CABG X4  12-18-2013  . Mild dementia   . Type 2 diabetes mellitus   . Hypothyroidism   . History of esophageal dilatation   . History of gastritis   . History of hiatal hernia   . History of bladder cancer     hx  TCC low grade  . Prostate cancer     low-grade  . Kidney tumor     right  . BPH (benign prostatic hyperplasia)   . CKD (chronic kidney disease) stage 3, GFR 30-59 ml/min   . History of kidney stones   . ED (erectile dysfunction)   . S/P CABG x 4     12-18-2013  . Bladder tumor   . Complication of anesthesia     HARD TO WAKE  . OSA on CPAP     mild osa   per sleep study 07-11-2009- patient states does not use regularly   Past Surgical History  Procedure Laterality Date  . Upper gastrointestinal endoscopy  04/06/2010  &  01-08-2013    w/ esophageal dilatation  . Hemorrhoid surgery    . Hand surgery      right  . Wrist fracture surgery      pins placed-left  . Cystoscopy with biopsy  01/09/2012    Procedure: CYSTOSCOPY WITH BIOPSY;  Surgeon: Marissa Nestle,  MD;  Location: [facility];  Service: Urology;  Laterality: N/A;  Cystoscopy with Multiple Bladder Biopsies  . Esophagogastroduodenoscopy (egd) with esophageal dilation N/A 01/08/2013    Procedure: ESOPHAGOGASTRODUODENOSCOPY (EGD) WITH ESOPHAGEAL DILATION;  Surgeon: Rogene Houston, MD;  Location: [facility];  Service: Endoscopy;  Laterality: N/A;  120  . Left heart cath N/A 12/17/2013    Procedure: LEFT HEART CATH;  Surgeon: Leonie Man, MD;  Location: [facility] CATH LAB;  Service: Cardiovascular;  Laterality: N/A;  . Left  heart catheterization with coronary angiogram N/A 12/17/2013    Procedure: LEFT HEART CATHETERIZATION WITH CORONARY ANGIOGRAM;  Surgeon: Leonie Man, MD;  Location: [facility] CATH LAB;  Service: Cardiovascular;  Laterality: N/A;  . Colonoscopy with esophagogastroduodenoscopy (egd)  08-15-2006    w/ esophageal dilatation  . Umbilical hernia repair  04-24-2003  . Transurethral resection of bladder tumor  06-14-2004  . Transurethral resection of prostate  10-04-2004  . Cysto/  removal bladder calculus/  resection bladder tumor  10-16-2006  . Cystostomy w/ bladder biopsy  01-09-2012  . Cardiac catheterization  12-17-2013   dr Shanon Brow harding    severe disease RCA with thrombotic appearing subtotal occlusions/  95-99%  OM2/  diffuse disease LAD  and D1/  mild to moderate basal to mid inferior hypokinesis/  severe elevated LVEDP/  ef 45-50%  . Coronary artery bypass graft N/A 12/18/2013    Procedure: Coronary artery bypass grafting times four using left internal mammary artery and endoscopically harvested right saphenous vein graft;  Surgeon: Gaye Pollack, MD;  Location: [facility];  Service: Open Heart Surgery;  Laterality: N/A;  . Transthoracic echocardiogram  11-14-2012    grade I diastolic dysfunction/  ef 55-60%/  trivial MR  and TR  . Cardiovascular stress test  08-09-2009  dr berry    mild to moderate perfusion defect in basal , mid, and apical inferior regions consistent with an infart/scar/   No evidence ischemia/  ef 51%/  no ECG changes/   low risk scan  . Transurethral resection of bladder tumor N/A 04/07/2014    Procedure: TRANSURETHRAL RESECTION OF BLADDER TUMOR (TURBT);  Surgeon: Arvil Persons, MD;  Location: [facility];  Service: Urology;  Laterality: N/A;  . Cystoscopy/retrograde/ureteroscopy Right 04/07/2014    Procedure: CYSTOSCOPY/RETROGRADE/URETEROSCOPY WITH BIOPSY OF RENAL PELVIS TUMOR;  Surgeon: Arvil Persons, MD;  Location: [facility];  Service: Urology;   Laterality: Right;  . Robot assisted laparoscopic nephrectomy Right 06/05/2014    Procedure: ROBOTIC ASSISTED LAPAROSCOPIC RIGHT NEPHROURETERECTOMY, VENTRAL HERNIA REPAIR ;  Surgeon: Ardis Hughs, MD;  Location: [facility];  Service: Urology;  Laterality: Right;  . Cystoscopy w/ ureteral stent placement Right 06/05/2014    Procedure: CYSTOSCOPY ;  Surgeon: Ardis Hughs, MD;  Location: [facility];  Service: Urology;  Laterality: Right;  . Radiology with anesthesia N/A 06/22/2014    Procedure: MRI BRAIN W/O CONTRAST;  Surgeon: Medication Radiologist, MD;  Location: [place];  Service: Radiology;  Laterality: N/A;   Social History:  reports that he has quit smoking. His smoking use included Cigarettes. He has a 4 pack-year smoking history. He quit smokeless tobacco use about 54 years ago. His smokeless tobacco use included Chew. He reports that he does not drink alcohol or use illicit drugs. He lives at home alone but he has 24 7 care. Frequent falls. Moderate assist with ADLs  Allergies  Allergen Reactions  . Lorazepam     Hallucinations, worsens agitation  .  Statins Other (See Comments)    MUSCLE ACHES    Family History  Problem Relation Age of Onset  . Cancer - Lung Brother    family medical history reviewed and noncontributory to the admission of this elderly gentleman  Prior to Admission medications   Medication Sig Start Date End Date Taking? Authorizing Provider  aspirin EC 325 MG EC tablet Take 1 tablet (325 mg total) by mouth daily. 12/24/13  Yes Donielle Liston Alba, PA-C  donepezil (ARICEPT) 10 MG tablet Take 10 mg by mouth at bedtime.   Yes Historical Provider, MD  escitalopram (LEXAPRO) 20 MG tablet Take 20 mg by mouth daily.   Yes Historical Provider, MD  fludrocortisone (FLORINEF) 0.1 MG tablet Take 0.1 mg by mouth 2 (two) times daily.   Yes Historical Provider, MD  levothyroxine (SYNTHROID, LEVOTHROID) 125 MCG tablet Take 125 mcg by mouth daily before breakfast.   Yes  Historical Provider, MD  midodrine (PROAMATINE) 10 MG tablet Take 10 mg by mouth 2 (two) times daily.   Yes Historical Provider, MD  pantoprazole (PROTONIX) 40 MG tablet TAKE ONE TABLET EVERY MORNING 04/06/14  Yes Butch Penny, NP  polyethylene glycol (MIRALAX / GLYCOLAX) packet Take 17 g by mouth at bedtime.   Yes Historical Provider, MD  ziprasidone (GEODON) 20 MG capsule Take 1 capsule (20 mg total) by mouth 2 (two) times daily with a meal. 06/22/14  Yes Cristal Ford, DO   Physical Exam: Filed Vitals:   08/17/14 1030 08/17/14 1100 08/17/14 1130 08/17/14 1200  BP: 114/77 116/104 122/77 110/63  Pulse: 65     Temp:      TempSrc:      Resp: 23 24 24 23   Height:      Weight:      SpO2: 100%       Wt Readings from Last 3 Encounters:  08/17/14 97.523 kg (215 lb)  07/31/14 97.523 kg (215 lb)  06/22/14 95.4 kg (210 lb 5.1 oz)    General:  Appears calm and only mildly uncomfortable  Eyes: PERRL, normal lids, irises & conjunctiva ENT: grossly normal hearing, lips & tongue, because membranes of his mouth are moist and pink  Neck: no LAD, masses or thyromegaly Cardiovascular: RRR, no m/r/g. Right leg edema greater than left. Pedal pulses present and palpable  Respiratory: Mild increased work of breathing. Good air flow. Diffuse wheezing and fine crackles at bases.  Abdomen: soft, ntnd Positive bowel sounds throughout no guarding  Skin: no rash or induration seen on limited exam Musculoskeletal: grossly normal tone BUE/BLE. Right leg swollen but warm and dry. No clubbing or cyanosis  Psychiatric: grossly normal mood and affect, speech fluent and appropriate Neurologic: grossly non-focal. oriented to self and place. Follows commands. Responds appropriately to questions           Labs on Admission:  Basic Metabolic Panel:  Recent Labs Lab 08/17/14 0900  NA 141  K 4.0  CL 108  CO2 25  GLUCOSE 124*  BUN 23*  CREATININE 2.03*  CALCIUM 9.2   Liver Function Tests:  Recent  Labs Lab 08/17/14 0900  AST 29  ALT 40  ALKPHOS 88  BILITOT 0.5  PROT 6.6  ALBUMIN 3.0*   No results for input(s): LIPASE, AMYLASE in the last 168 hours. No results for input(s): AMMONIA in the last 168 hours. CBC:  Recent Labs Lab 08/17/14 0900  WBC 10.4  NEUTROABS 8.2*  HGB 10.6*  HCT 33.8*  MCV 91.1  PLT 408*  Cardiac Enzymes:  Recent Labs Lab 08/17/14 0900  TROPONINI 1.28*    BNP (last 3 results)  Recent Labs  08/17/14 0900  BNP 2588.0*    ProBNP (last 3 results) No results for input(s): PROBNP in the last 8760 hours.  CBG: No results for input(s): GLUCAP in the last 168 hours.  Radiological Exams on Admission: Dg Chest Portable 1 View  08/17/2014   CLINICAL DATA:  Shortness of breath for 2 weeks.  EXAM: PORTABLE CHEST - 1 VIEW  COMPARISON:  06/12/2014  FINDINGS: There is patchy bilateral lung opacity which is new from previous, most notable at bases and in the left perihilar lung. Possible small pleural effusions.  Stable cardiomegaly and aortic tortuosity. The patient is status post CABG. No pneumothorax.  IMPRESSION: Bilateral lung opacity which could reflect multi focal pneumonia or asymmetric edema.   Electronically Signed   By: Monte Fantasia M.D.   On: 08/17/2014 09:36    EKG: As noted above  Assessment/Plan Principal Problem:   CHF (congestive heart failure): Acute. Chart review indicates he had an ST segment elevation myocardial infarction treated with intervention of his right coronary artery by Dr. Ellyn Hack in September 2015 and then underwent coronary artery bypass. Chart indicates EF 45-50%. Elevated BNP and troponin, chest x-ray concerning for edema. Will admit to telemetry. We'll cycle troponin get serial EKG. Get a 2-D echo. Monitor intake and output obtain daily weights. He was given 40 mg of Lasix we will continue this every 12. Cardiology consult requested.  Active Problems: Acute renal failure superimposed on stage 3 chronic kidney  disease: s/p nephrectomy 05/2013. Likely related to #1. Pierces baseline creatinine is in the range of 1.2-1.4. Will continue Lasix as noted above. Monitor intake and output.    Elevated troponin: 3 of same but not as high as 1.28. Denies chest pain. EKG without acute changes. Will continue aspirin. Monitor on telemetry. Get a 2-D echo and repeat his EKG in the morning.  DM type 2 (diabetes mellitus, type 2) diet controlled. Will obtain a hemoglobin A1c. Will monitor   Obstructive sleep apnea: Review indicates he wears a C Pap occasionally. Will request respiratory consult    CAD, multiple vessel: Denies chest pain. Elevated troponin as noted above was no acute EKG changes. Chart review indicates evaluated by cardiology in January of this year for preop clearance. Note indicates Myoview performed 2 years prior that was nonischemic. In addition history of ST segment elevation with cath and CABG as noted above. She is postoperatively have been hypotension. Cardiology consult requested  Hypotension. Patient is on Midodrin and Florinef. Blood pressure stable at the time of my exam. He will be getting Lasix regularly so we'll continue these medications. Will check his orthostatics in the morning.      Hyperlipidemia: Chart review indicates is intolerant to statin drugs. Will check a lipid panel  Hypothyroidism: We'll check a TSH.  Vascular dementia: appears stable at baseline.       Code Status: DNR DVT Prophylaxis: Family Communication: grandaughter at bedside Disposition Plan: home when ready  Time spent: 85 minutes  [facility] Pager [phone]

## 2014-08-17 NOTE — Progress Notes (Signed)
INITIAL NUTRITION ASSESSMENT Pt meets criteria for SEVERE MALNUTRITION in the context of CHRONIC ILLNESS as evidenced by Loss of 9% bw in <3 months and an oral intake that met <75% estimated needs for > 1 month. DOCUMENTATION CODES Per approved criteria  -Severe malnutrition in the context of chronic illness   INTERVENTION: -Ensure Enlive po TID, each supplement provides 350 kcal and 20 grams of protein  -Snacks per family request  NUTRITION DIAGNOSIS: Inadequate oral intake related to loss of appetite, dementia, and fluid accumulation as evidenced by loss of 22 lbs in 2.5 months.   Goal: Oral intake to meet >90% of needs  Monitor:  Oral intake, snack/supp preferences, labs, fluid status, weight  Reason for Assessment: MST  81 y.o. male  Admitting Dx: CHF (congestive heart failure)  ASSESSMENT: 81 y.o. male pmhx: vascular dementia, STEMI, diabetes, bladder cancer,  hypothyroidism, CKD stage III presents to the ED with the chief complaint of worsening SOB,  lower extremity edema and decreased urine output.   Spoke with family member at bedside. Pt had kidney procedure done at the of February. During that time, pt reportedly went 10 days without eating d/t dementia and lack of appetite. He then went to a snf where he was put on a pureed diet. Family states he did not like the pureed diet and didnt eat well there. Then when he went home, he had an accumulation of fluid and has not been eating well.   Family member states the medicationss he is given for his dementia have the side effect of reducing appetie  Overall he has been eating ~50% of his normal for 2.5 months. Because of his poor meal intake, family was supplementing him with 2-3 ensures/day  Family member states pt has had constipation, likely related to his medication, for which he takes miralax and senekot  Normal weight prior to [place] procedure was 238 lbs   Nutrition Focused Physical Exam:  Subcutaneous Fat:   Orbital Region: well nourished Upper Arm Region: n/a Thoracic and Lumbar Region: n/a  Muscle:  Temple Region: mild muscle loss Clavicle Bone Region: well nourished Clavicle and Acromion Bone Region: well nourished Scapular Bone Region: n/a Dorsal Hand: well nourished Patellar Region: edemetous Anterior Thigh Region: edemetous Posterior Calf Region: edemetous  Edema: non-pitting BLE   Height: Ht Readings from Last 1 Encounters:  08/17/14 5' 10"  (1.778 m)    Weight: Wt Readings from Last 1 Encounters:  08/17/14 214 lb 1.6 oz (97.115 kg)    Ideal Body Weight: 160 lbs (72.7 kg)  % Ideal Body Weight: 134%   Wt Readings from Last 10 Encounters:  08/17/14 214 lb 1.6 oz (97.115 kg)  07/31/14 215 lb (97.523 kg)  06/22/14 210 lb 5.1 oz (95.4 kg)  06/05/14 236 lb (107.049 kg)  04/22/14 234 lb (106.142 kg)  04/07/14 234 lb (106.142 kg)  02/24/14 232 lb (105.235 kg)  01/28/14 237 lb (107.502 kg)  01/23/14 237 lb 14 oz (107.9 kg)  01/13/14 237 lb 14.4 oz (107.911 kg)  Loss of 22 lbs in 2.5 months  Usual Body Weight: 238  % Usual Body Weight: 90%  BMI:  Body mass index is 30.72 kg/(m^2).  Estimated Nutritional Needs: Kcal: 1650-1850 (17-19 kcal/kg) Protein: 73-88 g/pro (1-1.2g/kg) Fluid: 1650-1850 mls  Skin: WDL  Diet Order: Diet heart healthy/carb modified Room service appropriate?: Yes; Fluid consistency:: Thin  EDUCATION NEEDS: -No education needs identified at this time   Intake/Output Summary (Last 24 hours) at 08/17/14 1613 Last data  filed at 08/17/14 1311  Gross per 24 hour  Intake      0 ml  Output    625 ml  Net   -625 ml    Last BM: Unknown  Labs:   Recent Labs Lab 08/17/14 0900  NA 141  K 4.0  CL 108  CO2 25  BUN 23*  CREATININE 2.03*  CALCIUM 9.2  GLUCOSE 124*    CBG (last 3)  No results for input(s): GLUCAP in the last 72 hours.  Scheduled Meds: . antiseptic oral rinse  7 mL Mouth Rinse BID  . aspirin EC  325 mg Oral Daily   . donepezil  10 mg Oral QHS  . enoxaparin (LOVENOX) injection  40 mg Subcutaneous QHS  . escitalopram  20 mg Oral Daily  . feeding supplement (ENSURE ENLIVE)  237 mL Oral BID BM  . fludrocortisone  0.1 mg Oral BID  . furosemide  40 mg Intravenous Q12H  . insulin aspart  0-15 Units Subcutaneous TID WC  . insulin aspart  0-5 Units Subcutaneous QHS  . [START ON 08/18/2014] levothyroxine  125 mcg Oral QAC breakfast  . midodrine  10 mg Oral BID  . pantoprazole  40 mg Oral q morning - 10a  . polyethylene glycol  17 g Oral QHS  . sodium chloride  3 mL Intravenous Q12H  . sodium chloride  3 mL Intravenous Q12H  . ziprasidone  20 mg Oral BID WC    Continuous Infusions:   Past Medical History  Diagnosis Date  . Chronic constipation   . GERD (gastroesophageal reflux disease)   . Arthritis   . Hyperlipidemia   . Orthostatic hypotension 11/19/2013  . Coronary artery disease     CARDIOLOGIST-  DR BERRY  --  HX  STEMI  12-17-2013  S/P  CABG X4  12-18-2013  . Mild dementia   . Type 2 diabetes mellitus   . Hypothyroidism   . History of esophageal dilatation   . History of gastritis   . History of hiatal hernia   . History of bladder cancer     hx  TCC low grade  . Prostate cancer     low-grade  . Kidney tumor     right  . BPH (benign prostatic hyperplasia)   . CKD (chronic kidney disease) stage 3, GFR 30-59 ml/min   . History of kidney stones   . ED (erectile dysfunction)   . S/P CABG x 4     12-18-2013  . Bladder tumor   . Complication of anesthesia     HARD TO WAKE  . OSA on CPAP     mild osa   per sleep study 07-11-2009- patient states does not use regularly    Past Surgical History  Procedure Laterality Date  . Upper gastrointestinal endoscopy  04/06/2010  &  01-08-2013    w/ esophageal dilatation  . Hemorrhoid surgery    . Hand surgery      right  . Wrist fracture surgery      pins placed-left  . Cystoscopy with biopsy  01/09/2012    Procedure: CYSTOSCOPY WITH  BIOPSY;  Surgeon: Marissa Nestle, MD;  Location: [facility];  Service: Urology;  Laterality: N/A;  Cystoscopy with Multiple Bladder Biopsies  . Esophagogastroduodenoscopy (egd) with esophageal dilation N/A 01/08/2013    Procedure: ESOPHAGOGASTRODUODENOSCOPY (EGD) WITH ESOPHAGEAL DILATION;  Surgeon: Rogene Houston, MD;  Location: [facility];  Service: Endoscopy;  Laterality: N/A;  120  . Left  heart cath N/A 12/17/2013    Procedure: LEFT HEART CATH;  Surgeon: Leonie Man, MD;  Location: [facility] CATH LAB;  Service: Cardiovascular;  Laterality: N/A;  . Left heart catheterization with coronary angiogram N/A 12/17/2013    Procedure: LEFT HEART CATHETERIZATION WITH CORONARY ANGIOGRAM;  Surgeon: Leonie Man, MD;  Location: [facility] CATH LAB;  Service: Cardiovascular;  Laterality: N/A;  . Colonoscopy with esophagogastroduodenoscopy (egd)  08-15-2006    w/ esophageal dilatation  . Umbilical hernia repair  04-24-2003  . Transurethral resection of bladder tumor  06-14-2004  . Transurethral resection of prostate  10-04-2004  . Cysto/  removal bladder calculus/  resection bladder tumor  10-16-2006  . Cystostomy w/ bladder biopsy  01-09-2012  . Cardiac catheterization  12-17-2013   dr Shanon Brow harding    severe disease RCA with thrombotic appearing subtotal occlusions/  95-99%  OM2/  diffuse disease LAD  and D1/  mild to moderate basal to mid inferior hypokinesis/  severe elevated LVEDP/  ef 45-50%  . Coronary artery bypass graft N/A 12/18/2013    Procedure: Coronary artery bypass grafting times four using left internal mammary artery and endoscopically harvested right saphenous vein graft;  Surgeon: Gaye Pollack, MD;  Location: [facility] OR;  Service: Open Heart Surgery;  Laterality: N/A;  . Transthoracic echocardiogram  11-14-2012    grade I diastolic dysfunction/  ef 55-60%/  trivial MR  and TR  . Cardiovascular stress test  08-09-2009  dr berry    mild to moderate perfusion defect in basal , mid, and apical inferior  regions consistent with an infart/scar/   No evidence ischemia/  ef 51%/  no ECG changes/   low risk scan  . Transurethral resection of bladder tumor N/A 04/07/2014    Procedure: TRANSURETHRAL RESECTION OF BLADDER TUMOR (TURBT);  Surgeon: Arvil Persons, MD;  [facility]  Service: Urology;  Laterality: N/A;  . Cystoscopy/retrograde/ureteroscopy Right 04/07/2014    Procedure: CYSTOSCOPY/RETROGRADE/URETEROSCOPY WITH BIOPSY OF RENAL PELVIS TUMOR;  Surgeon: Arvil Persons, MD;  Location: [facility];  Service: Urology;  Laterality: Right;  . Robot assisted laparoscopic nephrectomy Right 06/05/2014    Procedure: ROBOTIC ASSISTED LAPAROSCOPIC RIGHT NEPHROURETERECTOMY, VENTRAL HERNIA REPAIR ;  Surgeon: Ardis Hughs, MD;  Location: [facility];  Service: Urology;  Laterality: Right;  . Cystoscopy w/ ureteral stent placement Right 06/05/2014    Procedure: CYSTOSCOPY ;  Surgeon: Ardis Hughs, MD;  Location: [facility];  Service: Urology;  Laterality: Right;  . Radiology with anesthesia N/A 06/22/2014    Procedure: MRI BRAIN W/O CONTRAST;  Surgeon: Medication Radiologist, MD;  Location: [place];  Service: Radiology;  Laterality: N/A;    Burtis Junes RD, LDN Nutrition Pager: [phone] 08/17/2014 4:13 PM

## 2014-08-17 NOTE — ED Notes (Signed)
Sob times one week.  Worse last night.  Denies any pain.  Decreased urine output over the weekend.

## 2014-08-17 NOTE — Progress Notes (Signed)
Patient has CPAP ordered, explained to him doctor ordered and wants him to wear tonight. Patient, his daughter, and son said he will not wear everytime he wears at home he jerks off after only a few minutes. Patient is on 02 and tolerating that well. RT will continue to monitor.

## 2014-08-17 NOTE — Consult Note (Addendum)
CARDIOLOGY CONSULT NOTE  Patient ID: [name] [name] MRN: [id number] DOB/AGE: [birth year] 79 y.o.  Admit date: 08/17/2014 Primary Physician Purvis Kilts, MD  Primary cardiologist: Quay Burow MD Consulting cardiologist: Kate Sable MD Reason for Consultation: CHF, CABG, elevated troponin  HPI: The patient is a 81 yr old male with an extensive and complex past medical history which includes CAD with inferior STEMI for which he underwent PCI of the RCA in 12/2013, followed by CABG afterwards (LIMA to LAD, vein graft to an obtuse marginal branch, diagonal branch and PDA. 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   Much of the relevant current history obtained from daughter-in-law, who stays with him Mon-Wed. He had two "weak spells" last Monday and Tuesday, and was significantly short of breath. Denied chest pain. Had "cold sweats". Had shoulder pain and scapular pain but she cannot remember which shoulder. She felt like he needed to see MD but he didn't want to go. Another sitter saw him on Friday and remarked he was short of breath.  When daughter-in-law saw him this morning, he was markedly short of breath and sitting up (as he could not lie down) and "his sentences were being cut off" due to rapid shallow breathing. Again denied chest pain. She had also noticed right leg swelling at site he had prior ankle fracture and thought he may have reinjured it. This prompted ED evaluation.  He has since received IV Lasix and daughter-in-law says swelling has gone down.  Relevant labs include troponin 1.28, BNP 2588, creatinine 2.03, Hgb 10.6, normal WBC  count, platelets 408.  CXR showed b/l lung opacity representing multifocal pneumonia vs asymmetric edema.  ECG showed sinus rhythm, incomplete RBBB (QRS dur 102 ms), lateral Q waves, and nonspecific T wave abnormality (more prominent inferior Q waves on ECG from 06/2014).    Allergies  Allergen Reactions  . Lorazepam     Hallucinations, worsens agitation  . Statins Other (See Comments)    MUSCLE ACHES    Current Facility-Administered Medications  Medication Dose Route Frequency Provider Last Rate Last Dose  . 0.9 %  sodium chloride infusion  250 mL Intravenous PRN Radene Gunning, NP      . acetaminophen (TYLENOL) tablet 650 mg  650 mg Oral Q6H PRN Radene Gunning, NP       Or  . acetaminophen (TYLENOL) suppository 650 mg  650 mg Rectal Q6H PRN Radene Gunning, NP      . alum & mag hydroxide-simeth (MAALOX/MYLANTA) 200-200-20 MG/5ML suspension 30 mL  30 mL Oral Q6H PRN Radene Gunning, NP      . antiseptic oral rinse (CPC / CETYLPYRIDINIUM CHLORIDE 0.05%) solution 7 mL  7 mL Mouth Rinse BID Erline Hau, MD      . aspirin EC tablet 325 mg  325 mg Oral Daily Lezlie Octave Black, NP      . bisacodyl (DULCOLAX) suppository 10 mg  10 mg Rectal Daily PRN Radene Gunning, NP      . donepezil (ARICEPT) tablet 10 mg  10 mg Oral QHS Lezlie Octave Black, NP      . enoxaparin (LOVENOX) injection 40 mg  40 mg Subcutaneous QHS Santiago Glad  Gaetano Net, NP      . escitalopram (LEXAPRO) tablet 20 mg  20 mg Oral Daily Lezlie Octave Black, NP      . feeding supplement (ENSURE ENLIVE) (ENSURE ENLIVE) liquid 237 mL  237 mL Oral BID BM Estela Leonie Green, MD      . fludrocortisone (FLORINEF) tablet 0.1 mg  0.1 mg Oral BID Lezlie Octave Black, NP      . furosemide (LASIX) injection 40 mg  40 mg Intravenous Q12H Lezlie Octave Black, NP      . HYDROcodone-acetaminophen (NORCO/VICODIN) 5-325 MG per tablet 1-2 tablet  1-2 tablet Oral Q4H PRN Radene Gunning, NP      . insulin aspart (novoLOG) injection 0-15 Units  0-15 Units Subcutaneous TID  WC Lezlie Octave Black, NP      . insulin aspart (novoLOG) injection 0-5 Units  0-5 Units Subcutaneous QHS Radene Gunning, NP      . Derrill Memo ON 08/18/2014] levothyroxine (SYNTHROID, LEVOTHROID) tablet 125 mcg  125 mcg Oral QAC breakfast Radene Gunning, NP      . magnesium citrate solution 1 Bottle  1 Bottle Oral Once PRN Radene Gunning, NP      . midodrine (PROAMATINE) tablet 10 mg  10 mg Oral BID Radene Gunning, NP      . ondansetron [facility]) tablet 4 mg  4 mg Oral Q6H PRN Radene Gunning, NP       Or  . ondansetron [facility]) injection 4 mg  4 mg Intravenous Q6H PRN Radene Gunning, NP      . pantoprazole (PROTONIX) EC tablet 40 mg  40 mg Oral q morning - 10a Lezlie Octave Black, NP      . polyethylene glycol (MIRALAX / GLYCOLAX) packet 17 g  17 g Oral QHS Lezlie Octave Black, NP      . polyethylene glycol (MIRALAX / GLYCOLAX) packet 17 g  17 g Oral Daily PRN Lezlie Octave Black, NP      . sodium chloride 0.9 % injection 3 mL  3 mL Intravenous Q12H Lezlie Octave Black, NP      . sodium chloride 0.9 % injection 3 mL  3 mL Intravenous Q12H Lezlie Octave Black, NP      . sodium chloride 0.9 % injection 3 mL  3 mL Intravenous PRN Radene Gunning, NP      . traZODone (DESYREL) tablet 25 mg  25 mg Oral QHS PRN Radene Gunning, NP      . ziprasidone (GEODON) capsule 20 mg  20 mg Oral BID WC Radene Gunning, NP        Past Medical History  Diagnosis Date  . Chronic constipation   . GERD (gastroesophageal reflux disease)   . Arthritis   . Hyperlipidemia   . Orthostatic hypotension 11/19/2013  . Coronary artery disease     CARDIOLOGIST-  DR BERRY  --  HX  STEMI  12-17-2013  S/P  CABG X4  12-18-2013  . Mild dementia   . Type 2 diabetes mellitus   . Hypothyroidism   . History of esophageal dilatation   . History of gastritis   . History of hiatal hernia   . History of bladder cancer     hx  TCC low grade  . Prostate cancer     low-grade  . Kidney tumor     right  . BPH (benign prostatic hyperplasia)   . CKD (chronic kidney disease) stage 3,  GFR 30-59  ml/min   . History of kidney stones   . ED (erectile dysfunction)   . S/P CABG x 4     12-18-2013  . Bladder tumor   . Complication of anesthesia     HARD TO WAKE  . OSA on CPAP     mild osa   per sleep study 07-11-2009- patient states does not use regularly    Past Surgical History  Procedure Laterality Date  . Upper gastrointestinal endoscopy  04/06/2010  &  01-08-2013    w/ esophageal dilatation  . Hemorrhoid surgery    . Hand surgery      right  . Wrist fracture surgery      pins placed-left  . Cystoscopy with biopsy  01/09/2012    Procedure: CYSTOSCOPY WITH BIOPSY;  Surgeon: Marissa Nestle, MD;  Location: [facility] ORS;  Service: Urology;  Laterality: N/A;  Cystoscopy with Multiple Bladder Biopsies  . Esophagogastroduodenoscopy (egd) with esophageal dilation N/A 01/08/2013    Procedure: ESOPHAGOGASTRODUODENOSCOPY (EGD) WITH ESOPHAGEAL DILATION;  Surgeon: Rogene Houston, MD;  Location: [facility] END[facility] SUITE;  Service: Endoscopy;  Laterality: N/A;  120  . Left heart cath N/A 12/17/2013    Procedure: LEFT HEART CATH;  Surgeon: Leonie Man, MD;  Location: [facility] CATH LAB;  Service: Cardiovascular;  Laterality: N/A;  . Left heart catheterization with coronary angiogram N/A 12/17/2013    Procedure: LEFT HEART CATHETERIZATION WITH CORONARY ANGIOGRAM;  Surgeon: Leonie Man, MD;  Location: [facility] CATH LAB;  Service: Cardiovascular;  Laterality: N/A;  . Colonoscopy with esophagogastroduodenoscopy (egd)  08-15-2006    w/ esophageal dilatation  . Umbilical hernia repair  04-24-2003  . Transurethral resection of bladder tumor  06-14-2004  . Transurethral resection of prostate  10-04-2004  . Cysto/  removal bladder calculus/  resection bladder tumor  10-16-2006  . Cystostomy w/ bladder biopsy  01-09-2012  . Cardiac catheterization  12-17-2013   dr Shanon Brow harding    severe disease RCA with thrombotic appearing subtotal occlusions/  95-99%  OM2/  diffuse disease LAD  and D1/  mild to moderate basal  to mid inferior hypokinesis/  severe elevated LVEDP/  ef 45-50%  . Coronary artery bypass graft N/A 12/18/2013    Procedure: Coronary artery bypass grafting times four using left internal mammary artery and endoscopically harvested right saphenous vein graft;  Surgeon: Gaye Pollack, MD;  Location: [facility] OR;  Service: Open Heart Surgery;  Laterality: N/A;  . Transthoracic echocardiogram  11-14-2012    grade I diastolic dysfunction/  ef 55-60%/  trivial MR  and TR  . Cardiovascular stress test  08-09-2009  dr berry    mild to moderate perfusion defect in basal , mid, and apical inferior regions consistent with an infart/scar/   No evidence ischemia/  ef 51%/  no ECG changes/   low risk scan  . Transurethral resection of bladder tumor N/A 04/07/2014    Procedure: TRANSURETHRAL RESECTION OF BLADDER TUMOR (TURBT);  Surgeon: Arvil Persons, MD;  Location[facility];  Service: Urology;  Laterality: N/A;  . Cystoscopy/retrograde/ureteroscopy Right 04/07/2014    Procedure: CYSTOSCOPY/RETROGRADE/URETEROSCOPY WITH BIOPSY OF RENAL PELVIS TUMOR;  Surgeon: Arvil Persons, MD;  Location: [facility];  Service: Urology;  Laterality: Right;  . Robot assisted laparoscopic nephrectomy Right 06/05/2014    Procedure: ROBOTIC ASSISTED LAPAROSCOPIC RIGHT NEPHROURETERECTOMY, VENTRAL HERNIA REPAIR ;  Surgeon: Ardis Hughs, MD;  Location: [facility] ORS;  Service: Urology;  Laterality: Right;  . Cystoscopy w/  ureteral stent placement Right 06/05/2014    Procedure: CYSTOSCOPY ;  Surgeon: Ardis Hughs, MD;  Location: [facility] ORS;  Service: Urology;  Laterality: Right;  . Radiology with anesthesia N/A 06/22/2014    Procedure: MRI BRAIN W/O CONTRAST;  Surgeon: Medication Radiologist, MD;  Location: [facility];  Service: Radiology;  Laterality: N/A;    History   Social History  . Marital Status: Divorced    Spouse Name: N/A  . Number of Children: 3  . Years of Education: HS   Occupational History  .      Social History Main Topics  . Smoking status: Former Smoker -- 1.00 packs/day for 4 years    Types: Cigarettes  . Smokeless tobacco: Former Systems developer    Types: Secor date: 01/03/1960  . Alcohol Use: No  . Drug Use: No  . Sexual Activity: Yes    Birth Control/ Protection: None   Other Topics Concern  . Not on file   Social History Narrative   Patient is single and lives alone.   Patient is retired.   Patient has a high school education.   Patient is right-handed.   Patient drinks one cup of coffee and one cup of soda or tea daily.   Patient has three adult children.     No family history of premature CAD in 1st degree relatives.  Prior to Admission medications   Medication Sig Start Date End Date Taking? Authorizing Provider  aspirin EC 325 MG EC tablet Take 1 tablet (325 mg total) by mouth daily. 12/24/13  Yes Donielle Liston Alba, PA-C  donepezil (ARICEPT) 10 MG tablet Take 10 mg by mouth at bedtime.   Yes Historical Provider, MD  escitalopram (LEXAPRO) 20 MG tablet Take 20 mg by mouth daily.   Yes Historical Provider, MD  fludrocortisone (FLORINEF) 0.1 MG tablet Take 0.1 mg by mouth 2 (two) times daily.   Yes Historical Provider, MD  levothyroxine (SYNTHROID, LEVOTHROID) 125 MCG tablet Take 125 mcg by mouth daily before breakfast.   Yes Historical Provider, MD  midodrine (PROAMATINE) 10 MG tablet Take 10 mg by mouth 2 (two) times daily.   Yes Historical Provider, MD  pantoprazole (PROTONIX) 40 MG tablet TAKE ONE TABLET EVERY MORNING 04/06/14  Yes Butch Penny, NP  polyethylene glycol (MIRALAX / GLYCOLAX) packet Take 17 g by mouth at bedtime.   Yes Historical Provider, MD  ziprasidone (GEODON) 20 MG capsule Take 1 capsule (20 mg total) by mouth 2 (two) times daily with a meal. 06/22/14  Yes Maryann Mikhail, DO     Review of systems complete and found to be negative unless listed above in HPI     Physical exam Blood pressure 114/69, pulse 65, temperature 98.7 F  (37.1 C), temperature source Oral, resp. rate 24, height 5\' 10"  (1.778 m), weight 214 lb 1.6 oz (97.115 kg), SpO2 99 %. General: Mildly tachypneic. Neck: +JVD.  Lungs: Bibasilar crackles, no wheezes at present, diminished sounds 1/4 up bilaterally. CV: Regular rate and rhythm, normal S1/S2, no S3/S4, no murmur.  1+ pitting right leg edema and dorsal foot edema.  Normal pedal pulses.  Abdomen: Soft, nontender, no distention.  Skin: Intact without lesions or rashes.  Neurologic: Alert, answering questions appropriately.  Psych: Normal affect. Extremities: No clubbing or cyanosis.  HEENT: Normal.   ECG: Most recent ECG reviewed.  Labs:   Lab Results  Component Value Date   WBC 10.4 08/17/2014   HGB 10.6* 08/17/2014  HCT 33.8* 08/17/2014   MCV 91.1 08/17/2014   PLT 408* 08/17/2014    Recent Labs Lab 08/17/14 0900  NA 141  K 4.0  CL 108  CO2 25  BUN 23*  CREATININE 2.03*  CALCIUM 9.2  PROT 6.6  BILITOT 0.5  ALKPHOS 88  ALT 40  AST 29  GLUCOSE 124*   Lab Results  Component Value Date   TROPONINI 1.28* 08/17/2014   No results found for: CHOL No results found for: HDL No results found for: LDLCALC No results found for: TRIG No results found for: CHOLHDL No results found for: LDLDIRECT       Studies: Dg Chest Portable 1 View  08/17/2014   CLINICAL DATA:  Shortness of breath for 2 weeks.  EXAM: PORTABLE CHEST - 1 VIEW  COMPARISON:  06/12/2014  FINDINGS: There is patchy bilateral lung opacity which is new from previous, most notable at bases and in the left perihilar lung. Possible small pleural effusions.  Stable cardiomegaly and aortic tortuosity. The patient is status post CABG. No pneumothorax.  IMPRESSION: Bilateral lung opacity which could reflect multi focal pneumonia or asymmetric edema.   Electronically Signed   By: Monte Fantasia M.D.   On: 08/17/2014 09:36    ASSESSMENT AND PLAN:  1. Elevated troponin in context of CAD, prior STEMI, and CABG, as well as  acute on chronic systolic heart failure: Troponin elevation may be a representation of demand ischemia in context of acute systolic heart failure. To be certain, will cycle serial troponins to see if this represents a NSTEMI. Would want to avoid coronary angiography if at all possible given CKD stage III with one kidney, as he is at very high risk of contrast-mediated nephropathy which could result in hemodialysis requirement. Intolerant to statin therapy. Would not heparinize at this point. Continue ASA. ECG without acute changes. Will order echocardiogram to assess LV function.  2. Acute on chronic systolic heart failure: Reportedly has less right leg edema after one dose of Lasix. Continue IV Lasix 40 mg bid with close BP and renal function monitoring. Obtain echocardiogram to assess for interval change in LV function.   3. Orthostatic hypotension: Continue midodrine and Florinef.  4. Hyperlipidemia: Intolerant to statins. Check lipid panel.   Signed: Kate Sable, M.D., F.A.C.C.  08/17/2014, 1:27 PM  ADDENDUM: Echo shows moderate to severely reduced RV systolic function. Consider pulmonary embolism in differential diagnosis. LV systolic function is essentially stable, EF 45%.

## 2014-08-17 NOTE — ED Notes (Signed)
CRITICAL VALUE ALERT  Critical value received:  Troponin 1.28  Date of notification:  08/17/14  Time of notification:  7127  Critical value read back:Yes.    Nurse who received alert:  Rosalee Kaufman  MD notified (1st page):  1023  Time of first page:  63  MD notified (2nd page):  Time of second page:  Responding MD:  Roderic Palau  Time MD responded:  1023

## 2014-08-17 NOTE — ED Provider Notes (Addendum)
CSN: [id number]     Arrival date & time 08/17/14  2585 History  This chart was scribed for [name] [name], MD by Martinique Peace, ED Scribe. The patient was seen in [place]. The patient's care was started at 9:04 AM.    Chief Complaint  Patient presents with  . Shortness of Breath      Patient is a 81 y.o. male presenting with shortness of breath. The history is provided by the patient (the pt complains of sob). No language interpreter was used.  Shortness of Breath Severity:  Moderate Onset quality:  Sudden Timing:  Intermittent Chronicity:  Recurrent Context: activity   Associated symptoms: diaphoresis   Associated symptoms: no abdominal pain, no chest pain, no cough, no headaches and no rash     HPI Comments: [name] [name] is a 81 y.o. male who presents to the Emergency Department complaining of intermittent SOB over the past week with some episodes of diaphoresis and decreased urine output over the weekend. Pt denies any pain or cough. Daughter notes pt is borderline diabetic. She also states pt fractured his ankle a few weeks ago and is now wearing an orthopedic boot. History of acid reflux. Past surgical history of kidney removal.    Past Medical History  Diagnosis Date  . Chronic constipation   . GERD (gastroesophageal reflux disease)   . Arthritis   . Hyperlipidemia   . Orthostatic hypotension 11/19/2013  . Coronary artery disease     CARDIOLOGIST-  DR BERRY  --  HX  STEMI  12-17-2013  S/P  CABG X4  12-18-2013  . Mild dementia   . Type 2 diabetes mellitus   . Hypothyroidism   . History of esophageal dilatation   . History of gastritis   . History of hiatal hernia   . History of bladder cancer     hx  TCC low grade  . Prostate cancer     low-grade  . Kidney tumor     right  . BPH (benign prostatic hyperplasia)   . CKD (chronic kidney disease) stage 3, GFR 30-59 ml/min   . History of kidney stones   . ED (erectile dysfunction)   . S/P CABG x 4     12-18-2013  .  Bladder tumor   . Complication of anesthesia     HARD TO WAKE  . OSA on CPAP     mild osa   per sleep study 07-11-2009- patient states does not use regularly   Past Surgical History  Procedure Laterality Date  . Upper gastrointestinal endoscopy  04/06/2010  &  01-08-2013    w/ esophageal dilatation  . Hemorrhoid surgery    . Hand surgery      right  . Wrist fracture surgery      pins placed-left  . Cystoscopy with biopsy  01/09/2012    Procedure: CYSTOSCOPY WITH BIOPSY;  Surgeon: Marissa Nestle, MD;  Location: [facility]S;  Service: Urology;  Laterality: N/A;  Cystoscopy with Multiple Bladder Biopsies  . Esophagogastroduodenoscopy (egd) with esophageal dilation N/A 01/08/2013    Procedure: ESOPHAGOGASTRODUODENOSCOPY (EGD) WITH ESOPHAGEAL DILATION;  Surgeon: Rogene Houston, MD;  Location: [facility] END[facility] SUITE;  Service: Endoscopy;  Laterality: N/A;  120  . Left heart cath N/A 12/17/2013    Procedure: LEFT HEART CATH;  Surgeon: Leonie Man, MD;  Location: [facility] CATH LAB;  Service: Cardiovascular;  Laterality: N/A;  . Left heart catheterization with coronary angiogram N/A 12/17/2013    Procedure: LEFT HEART CATHETERIZATION  WITH CORONARY ANGIOGRAM;  Surgeon: Leonie Man, MD;  Location: [facility] CATH LAB;  Service: Cardiovascular;  Laterality: N/A;  . Colonoscopy with esophagogastroduodenoscopy (egd)  08-15-2006    w/ esophageal dilatation  . Umbilical hernia repair  04-24-2003  . Transurethral resection of bladder tumor  06-14-2004  . Transurethral resection of prostate  10-04-2004  . Cysto/  removal bladder calculus/  resection bladder tumor  10-16-2006  . Cystostomy w/ bladder biopsy  01-09-2012  . Cardiac catheterization  12-17-2013   dr Shanon Brow harding    severe disease RCA with thrombotic appearing subtotal occlusions/  95-99%  OM2/  diffuse disease LAD  and D1/  mild to moderate basal to mid inferior hypokinesis/  severe elevated LVEDP/  ef 45-50%  . Coronary artery bypass graft N/A 12/18/2013     Procedure: Coronary artery bypass grafting times four using left internal mammary artery and endoscopically harvested right saphenous vein graft;  Surgeon: Gaye Pollack, MD;  Location: [facility] OR;  Service: Open Heart Surgery;  Laterality: N/A;  . Transthoracic echocardiogram  11-14-2012    grade I diastolic dysfunction/  ef 55-60%/  trivial MR  and TR  . Cardiovascular stress test  08-09-2009  dr berry    mild to moderate perfusion defect in basal , mid, and apical inferior regions consistent with an infart/scar/   No evidence ischemia/  ef 51%/  no ECG changes/   low risk scan  . Transurethral resection of bladder tumor N/A 04/07/2014    Procedure: TRANSURETHRAL RESECTION OF BLADDER TUMOR (TURBT);  Surgeon: Arvil Persons, MD;  Location: [facility];  Service: Urology;  Laterality: N/A;  . Cystoscopy/retrograde/ureteroscopy Right 04/07/2014    Procedure: CYSTOSCOPY/RETROGRADE/URETEROSCOPY WITH BIOPSY OF RENAL PELVIS TUMOR;  Surgeon: Arvil Persons, MD;  Location: [facility];  Service: Urology;  Laterality: Right;  . Robot assisted laparoscopic nephrectomy Right 06/05/2014    Procedure: ROBOTIC ASSISTED LAPAROSCOPIC RIGHT NEPHROURETERECTOMY, VENTRAL HERNIA REPAIR ;  Surgeon: Ardis Hughs, MD;  Location: [facility] ORS;  Service: Urology;  Laterality: Right;  . Cystoscopy w/ ureteral stent placement Right 06/05/2014    Procedure: CYSTOSCOPY ;  Surgeon: Ardis Hughs, MD;  Location: [facility] ORS;  Service: Urology;  Laterality: Right;  . Radiology with anesthesia N/A 06/22/2014    Procedure: MRI BRAIN W/O CONTRAST;  Surgeon: Medication Radiologist, MD;  Location: [place];  Service: Radiology;  Laterality: N/A;   Family History  Problem Relation Age of Onset  . Cancer - Lung Brother    History  Substance Use Topics  . Smoking status: Former Smoker -- 1.00 packs/day for 4 years    Types: Cigarettes  . Smokeless tobacco: Former Systems developer    Types: [place] date: 01/03/1960  .  Alcohol Use: No    Review of Systems  Constitutional: Positive for diaphoresis. Negative for appetite change and fatigue.  HENT: Negative for congestion, ear discharge and sinus pressure.   Eyes: Negative for discharge.  Respiratory: Positive for shortness of breath. Negative for cough.   Cardiovascular: Negative for chest pain.  Gastrointestinal: Negative for abdominal pain and diarrhea.  Genitourinary: Positive for decreased urine volume. Negative for frequency and hematuria.  Musculoskeletal: Negative for back pain.       Bruising and swelling to right ankle.   Skin: Negative for rash.  Neurological: Negative for seizures and headaches.  Psychiatric/Behavioral: Negative for hallucinations.      Allergies  Lorazepam and Statins  Home Medications  Prior to Admission medications   Medication Sig Start Date End Date Taking? Authorizing Provider  acetaminophen (TYLENOL) 500 MG tablet Take 2 tablets (1,000 mg total) by mouth 3 (three) times daily. 06/22/14   Maryann Mikhail, DO  aspirin EC 325 MG EC tablet Take 1 tablet (325 mg total) by mouth daily. 12/24/13   Donielle Liston Alba, PA-C  bisacodyl (DULCOLAX) 10 MG suppository Place 1 suppository (10 mg total) rectally daily as needed for moderate constipation. 06/22/14   Maryann Mikhail, DO  donepezil (ARICEPT) 10 MG tablet Take 10 mg by mouth at bedtime.    Historical Provider, MD  escitalopram (LEXAPRO) 20 MG tablet Take 20 mg by mouth daily.    Historical Provider, MD  feeding supplement, ENSURE COMPLETE, (ENSURE COMPLETE) LIQD Take 237 mLs by mouth 3 (three) times daily between meals. 06/22/14   Maryann Mikhail, DO  levothyroxine (SYNTHROID, LEVOTHROID) 125 MCG tablet Take 125 mcg by mouth daily before breakfast.    Historical Provider, MD  menthol-cetylpyridinium (CEPACOL) 3 MG lozenge Take 1 lozenge (3 mg total) by mouth as needed for sore throat. 06/22/14   Maryann Mikhail, DO  pantoprazole (PROTONIX) 40 MG tablet TAKE ONE TABLET  EVERY MORNING 04/06/14   Butch Penny, NP  polyethylene glycol (MIRALAX / GLYCOLAX) packet Take 17 g by mouth at bedtime.    Historical Provider, MD  senna-docusate (SENOKOT-S) 8.6-50 MG per tablet Take 2 tablets by mouth at bedtime. 06/22/14   Maryann Mikhail, DO  ziprasidone (GEODON) 20 MG capsule Take 1 capsule (20 mg total) by mouth 2 (two) times daily with a meal. 06/22/14   Maryann Mikhail, DO   BP 116/93 mmHg  Pulse 78  Temp(Src) 97.9 F (36.6 C) (Oral)  Resp 24  Ht 5' 10.5" (1.791 m)  Wt 215 lb (97.523 kg)  BMI 30.40 kg/m2  SpO2 95% Physical Exam  Constitutional: He is oriented to person, place, and time. He appears well-developed.  HENT:  Head: Normocephalic.  Eyes: Conjunctivae and EOM are normal. No scleral icterus.  Neck: Neck supple. No thyromegaly present.  Cardiovascular: Normal rate and regular rhythm.  Exam reveals no gallop and no friction rub.   No murmur heard. Pulmonary/Chest: No stridor. He has wheezes. He has no rales. He exhibits no tenderness.  Abdominal: He exhibits no distension. There is no tenderness. There is no rebound.  Musculoskeletal: Normal range of motion. He exhibits edema.  Bruising and swelling to right ankle.   Lymphadenopathy:    He has no cervical adenopathy.  Neurological: He is oriented to person, place, and time. He exhibits normal muscle tone. Coordination normal.  Skin: No rash noted. No erythema.  Psychiatric: He has a normal mood and affect. His behavior is normal.    ED Course  Procedures (including critical care time) Labs Review Labs Reviewed - No data to display  Imaging Review No results found.   EKG Interpretation   Date/Time:  Monday Aug 17 2014 09:21:43 EDT Ventricular Rate:  66 PR Interval:  163 QRS Duration: 102 QT Interval:  519 QTC Calculation: 544 R Axis:   115 Text Interpretation:  Sinus rhythm Right axis deviation Low voltage,  precordial leads RSR' in V1 or V2, probably normal variant Abnormal   lateral Q waves Borderline repolarization abnormality Prolonged QT  interval Confirmed by Myles Tavella  MD, Bettyjean Stefanski [phone]) on 08/17/2014 10:31:59 AM     Medications - No data to display  9:06 AM- Treatment plan was discussed with patient who verbalizes understanding and agrees.  CRITICAL CARE Performed by: Arijana Narayan L Total critical care time:40 Critical care time was exclusive of separately billable procedures and treating other patients. Critical care was necessary to treat or prevent imminent or life-threatening deterioration. Critical care was time spent personally by me on the following activities: development of treatment plan with patient and/or surrogate as well as nursing, discussions with consultants, evaluation of patient's response to treatment, examination of patient, obtaining history from patient or surrogate, ordering and performing treatments and interventions, ordering and review of laboratory studies, ordering and review of radiographic studies, pulse oximetry and re-evaluation of patient's condition.  MDM   Final diagnoses:  None    chf  The chart was scribed for me under my direct supervision.  I personally performed the history, physical, and medical decision making and all procedures in the evaluation of this patient.[name] [name], MD 08/17/14 2449  [name] [name], MD 08/17/14 [id number]

## 2014-08-18 ENCOUNTER — Inpatient Hospital Stay (HOSPITAL_COMMUNITY): Payer: Medicare Other

## 2014-08-18 DIAGNOSIS — I5189 Other ill-defined heart diseases: Secondary | ICD-10-CM

## 2014-08-18 DIAGNOSIS — E43 Unspecified severe protein-calorie malnutrition: Secondary | ICD-10-CM | POA: Diagnosis present

## 2014-08-18 DIAGNOSIS — I5023 Acute on chronic systolic (congestive) heart failure: Secondary | ICD-10-CM

## 2014-08-18 DIAGNOSIS — R0902 Hypoxemia: Secondary | ICD-10-CM

## 2014-08-18 LAB — GLUCOSE, CAPILLARY
GLUCOSE-CAPILLARY: 110 mg/dL — AB (ref 70–99)
Glucose-Capillary: 93 mg/dL (ref 70–99)
Glucose-Capillary: 104 mg/dL — ABNORMAL HIGH (ref 70–99)
Glucose-Capillary: 110 mg/dL — ABNORMAL HIGH (ref 70–99)

## 2014-08-18 LAB — BASIC METABOLIC PANEL
Anion gap: 8 (ref 5–15)
BUN: 24 mg/dL — AB (ref 6–20)
CO2: 27 mmol/L (ref 22–32)
CREATININE: 1.95 mg/dL — AB (ref 0.61–1.24)
Calcium: 8.8 mg/dL — ABNORMAL LOW (ref 8.9–10.3)
Chloride: 105 mmol/L (ref 101–111)
GFR calc Af Amer: 36 mL/min — ABNORMAL LOW (ref >60)
GFR calc non Af Amer: 31 mL/min — ABNORMAL LOW (ref >60)
Glucose, Bld: 106 mg/dL — ABNORMAL HIGH (ref 70–99)
Potassium: 3.4 mmol/L — ABNORMAL LOW (ref 3.5–5.1)
Sodium: 140 mmol/L (ref 135–145)

## 2014-08-18 LAB — CBC
HEMATOCRIT: 33.1 % — AB (ref 39.0–52.0)
Hemoglobin: 10.5 g/dL — ABNORMAL LOW (ref 13.0–17.0)
MCH: 28.8 pg (ref 26.0–34.0)
MCHC: 31.7 g/dL (ref 30.0–36.0)
MCV: 90.9 fL (ref 78.0–100.0)
Platelets: 388 10*3/uL (ref 150–400)
RBC: 3.64 MIL/uL — ABNORMAL LOW (ref 4.22–5.81)
RDW: 14.9 % (ref 11.5–15.5)
WBC: 9.2 10*3/uL (ref 4.0–10.5)

## 2014-08-18 LAB — TROPONIN I
TROPONIN I: 0.91 ng/mL — AB (ref <0.031)
Troponin I: 0.8 ng/mL (ref <0.031)

## 2014-08-18 LAB — HEMOGLOBIN A1C
Hgb A1c MFr Bld: 6.1 % — ABNORMAL HIGH (ref 4.8–5.6)
Mean Plasma Glucose: 128 mg/dL

## 2014-08-18 MED ORDER — POTASSIUM CHLORIDE 20 MEQ PO PACK
40.0000 meq | PACK | Freq: Once | ORAL | Status: AC
  Administered 2014-08-18: 40 meq via ORAL
  Filled 2014-08-18 (×2): qty 2

## 2014-08-18 NOTE — Progress Notes (Signed)
SUBJECTIVE: Feeling much better. No longer tachypneic. [name] (daughter-in-law) says that O2 sats dropped to low 80's when he walked a short distance off of oxygen.     Intake/Output Summary (Last 24 hours) at 08/18/14 1026 Last data filed at 08/18/14 0900  Gross per 24 hour  Intake    360 ml  Output   2425 ml  Net  -2065 ml    Current Facility-Administered Medications  Medication Dose Route Frequency Provider Last Rate Last Dose  . 0.9 %  sodium chloride infusion  250 mL Intravenous PRN Radene Gunning, NP      . acetaminophen (TYLENOL) tablet 650 mg  650 mg Oral Q6H PRN Radene Gunning, NP       Or  . acetaminophen (TYLENOL) suppository 650 mg  650 mg Rectal Q6H PRN Radene Gunning, NP      . alum & mag hydroxide-simeth (MAALOX/MYLANTA) 200-200-20 MG/5ML suspension 30 mL  30 mL Oral Q6H PRN Radene Gunning, NP      . antiseptic oral rinse (CPC / CETYLPYRIDINIUM CHLORIDE 0.05%) solution 7 mL  7 mL Mouth Rinse BID Erline Hau, MD   7 mL at 08/17/14 2215  . aspirin EC tablet 325 mg  325 mg Oral Daily Lezlie Octave Black, NP   325 mg at 08/17/14 1300  . bisacodyl (DULCOLAX) suppository 10 mg  10 mg Rectal Daily PRN Radene Gunning, NP      . donepezil (ARICEPT) tablet 10 mg  10 mg Oral QHS Radene Gunning, NP   10 mg at 08/17/14 2215  . enoxaparin (LOVENOX) injection 40 mg  40 mg Subcutaneous QHS Radene Gunning, NP   40 mg at 08/17/14 2212  . escitalopram (LEXAPRO) tablet 20 mg  20 mg Oral Daily Lezlie Octave Black, NP   20 mg at 08/17/14 1407  . feeding supplement (ENSURE ENLIVE) (ENSURE ENLIVE) liquid 237 mL  237 mL Oral TID BM Oswaldo Milian, RD   237 mL at 08/17/14 2215  . fludrocortisone (FLORINEF) tablet 0.1 mg  0.1 mg Oral BID Lezlie Octave Black, NP   0.1 mg at 08/17/14 2215  . furosemide (LASIX) injection 40 mg  40 mg Intravenous Q12H Radene Gunning, NP   40 mg at 08/18/14 6967  . HYDROcodone-acetaminophen (NORCO/VICODIN) 5-325 MG per tablet 1-2 tablet  1-2 tablet Oral Q4H PRN Radene Gunning, NP   2 tablet at 08/17/14 2213  . insulin aspart (novoLOG) injection 0-15 Units  0-15 Units Subcutaneous TID WC Radene Gunning, NP   0 Units at 08/17/14 1700  . insulin aspart (novoLOG) injection 0-5 Units  0-5 Units Subcutaneous QHS Radene Gunning, NP   0 Units at 08/17/14 2254  . levothyroxine (SYNTHROID, LEVOTHROID) tablet 125 mcg  125 mcg Oral QAC breakfast Radene Gunning, NP   125 mcg at 08/18/14 0840  . midodrine (PROAMATINE) tablet 10 mg  10 mg Oral BID Radene Gunning, NP   10 mg at 08/17/14 2212  . ondansetron (ZOFRAN) tablet 4 mg  4 mg Oral Q6H PRN Radene Gunning, NP       Or  . ondansetron [facility]) injection 4 mg  4 mg Intravenous Q6H PRN Radene Gunning, NP      . pantoprazole (PROTONIX) EC tablet 40 mg  40 mg Oral q morning - 10a Radene Gunning, NP   40 mg at 08/17/14 1406  . polyethylene glycol (MIRALAX /  GLYCOLAX) packet 17 g  17 g Oral QHS Radene Gunning, NP   17 g at 08/17/14 2212  . polyethylene glycol (MIRALAX / GLYCOLAX) packet 17 g  17 g Oral Daily PRN Lezlie Octave Black, NP      . sodium chloride 0.9 % injection 3 mL  3 mL Intravenous Q12H Radene Gunning, NP   3 mL at 08/17/14 1852  . sodium chloride 0.9 % injection 3 mL  3 mL Intravenous Q12H Radene Gunning, NP   3 mL at 08/17/14 2219  . sodium chloride 0.9 % injection 3 mL  3 mL Intravenous PRN Radene Gunning, NP      . traZODone (DESYREL) tablet 25 mg  25 mg Oral QHS PRN Radene Gunning, NP      . ziprasidone (GEODON) capsule 20 mg  20 mg Oral BID WC Radene Gunning, NP   20 mg at 08/18/14 0840    Filed Vitals:   08/17/14 2045 08/17/14 2357 08/18/14 0529 08/18/14 0721  BP: 123/68  108/70   Pulse: 75  62   Temp: 98 F (36.7 C)  97.7 F (36.5 C)   TempSrc: Oral  Oral   Resp: 22  22   Height:      Weight:   210 lb 4.8 oz (95.391 kg)   SpO2: 98% 97% 98% 98%    PHYSICAL EXAM General: NAD, sitting at edge of bed. HEENT: Normal. Neck: No JVD, no thyromegaly.  Lungs: Clear to auscultation bilaterally (slightly diminished at  bases) with normal respiratory effort. CV: Nondisplaced PMI.  Regular rate and rhythm, normal S1/S2, no S3/S4, no murmur.  Trivial right leg pretibial edema. Right foot in boot. Abdomen: Soft, nontender, no distention.  Skin: Intact without lesions or rashes.  Neurologic: Alert, answering questions appropriately.  Psych: Normal affect. Extremities: No clubbing or cyanosis.   TELEMETRY: Reviewed telemetry pt in sinus rhythm with PVC's.  LABS: Basic Metabolic Panel:  Recent Labs  08/17/14 0900 08/18/14 0400  NA 141 140  K 4.0 3.4*  CL 108 105  CO2 25 27  GLUCOSE 124* 106*  BUN 23* 24*  CREATININE 2.03* 1.95*  CALCIUM 9.2 8.8*   Liver Function Tests:  Recent Labs  08/17/14 0900  AST 29  ALT 40  ALKPHOS 88  BILITOT 0.5  PROT 6.6  ALBUMIN 3.0*   No results for input(s): LIPASE, AMYLASE in the last 72 hours. CBC:  Recent Labs  08/17/14 0900 08/18/14 0400  WBC 10.4 9.2  NEUTROABS 8.2*  --   HGB 10.6* 10.5*  HCT 33.8* 33.1*  MCV 91.1 90.9  PLT 408* 388   Cardiac Enzymes:  Recent Labs  08/17/14 1621 08/17/14 2210 08/18/14 0400  TROPONINI 1.04* 0.89* 0.91*   BNP: Invalid input(s): POCBNP D-Dimer: No results for input(s): DDIMER in the last 72 hours. Hemoglobin A1C:  Recent Labs  08/17/14 0900  HGBA1C 6.1*   Fasting Lipid Panel: No results for input(s): CHOL, HDL, LDLCALC, TRIG, CHOLHDL, LDLDIRECT in the last 72 hours. Thyroid Function Tests:  Recent Labs  08/17/14 0900  TSH 5.346*   Anemia Panel: No results for input(s): VITAMINB12, FOLATE, FERRITIN, TIBC, IRON, RETICCTPCT in the last 72 hours.  RADIOLOGY: Dg Ankle Complete Right  07/31/2014   CLINICAL DATA:  Right ankle pain and swelling  EXAM: RIGHT ANKLE - COMPLETE 3+ VIEW  COMPARISON:  None.  FINDINGS: Considerable soft tissue swelling is noted laterally. Minimally displaced fracture of the distal fibula is noted. No  tibial fracture is seen. A small plantar spur is noted.   IMPRESSION: Mildly displaced distal fibular fracture with associated soft tissue changes   Electronically Signed   By: Inez Catalina M.D.   On: 07/31/2014 17:00   Dg Chest Portable 1 View  08/17/2014   CLINICAL DATA:  Shortness of breath for 2 weeks.  EXAM: PORTABLE CHEST - 1 VIEW  COMPARISON:  06/12/2014  FINDINGS: There is patchy bilateral lung opacity which is new from previous, most notable at bases and in the left perihilar lung. Possible small pleural effusions.  Stable cardiomegaly and aortic tortuosity. The patient is status post CABG. No pneumothorax.  IMPRESSION: Bilateral lung opacity which could reflect multi focal pneumonia or asymmetric edema.   Electronically Signed   By: Monte Fantasia M.D.   On: 08/17/2014 09:36   Dg Foot Complete Right  07/31/2014   CLINICAL DATA:  Pain and paresthesias involving the right foot and ankle associated with swelling which began earlier today. Dizziness and fall. Initial encounter.  EXAM: RIGHT FOOT COMPLETE - 3+ VIEW  COMPARISON:  None.  FINDINGS: Osseous demineralization. No acute fractures involving the bones of the foot. Mild narrowing of joint spaces in the midfoot. Mild narrowing of the 1st MTP joint space with associated hypertrophic spurring. Soft tissue swelling involving the dorsum of the foot. Small to moderate-sized plantar calcaneal spur.  IMPRESSION: No acute osseous abnormality. Osseous demineralization. Mild osteoarthritis involving the midfoot and the 1st MTP joint.   Electronically Signed   By: Evangeline Dakin M.D.   On: 07/31/2014 17:01      ASSESSMENT AND PLAN: 1. Elevated troponin in context of CAD, prior STEMI, and CABG, as well as acute on chronic systolic heart failure: Troponin elevation may be a representation of demand ischemia in context of acute systolic heart failure, and is trending down. Also see #4. In any event, want to avoid coronary angiography if at all possible given CKD stage III with one kidney, as he is at very high  risk of contrast-mediated nephropathy which could result in hemodialysis requirement. Intolerant to statin therapy. Would not heparinize at this point. Continue ASA. ECG without acute changes. LV systolic function is essentially stable, EF 45%.  2. Acute on chronic systolic heart failure: Over 2.3 L output on IV Lasix 40 mg bid with reduced right leg edema. Will continue with close BP and renal function monitoring.   3. Orthostatic hypotension: Continue midodrine and Florinef.  4. Hyperlipidemia: Intolerant to statins. Check lipid panel as outpatient.  5. Moderate to severely reduced RV systolic function: Given desaturations, elevated troponin, and overall symptomatology, need to consider pulmonary embolism in differential diagnosis. LV systolic function is essentially stable, EF 45%. Would want to avoid CT angiogram of chest with contrast due to solitary kidney. Uncertain about image quality of VQ scan given CHF. Because of right leg swelling, will obtain venous Dopplers to exclude DVT. In any case, I think he would be a poor anticoagulation candidate due to orthostatic hypotension with frequent falls, as he would be at risk for an intracranial bleed.   Kate Sable, M.D., F.A.C.C.

## 2014-08-18 NOTE — Evaluation (Addendum)
Physical Therapy Evaluation Patient Details Name: [name] [name] MRN: [id number] DOB: 10-11-1933 Today's Date: 08/18/2014   History of Present Illness  Pt is a 81 year old male admitted with CHF.  He has dementia at baseline and has 24 hour care at home.  He has a recent right ankle fracture and wears a CAM walker for all gait.  Currently, because of his right ankle fracture, he uses a w/c for most mobility.  He has been receiving HHPT.  Clinical Impression   Pt was seen for evaluation.  He is found to be generally deconditioned although probably very close to baseline.  O2 sat on 2 L O2=98%, on room air at rest =95% and on room air with exertion = 80%.  He is able to ambulate 7' with a walker, good stability.  We will plan to resume all home health services at d/c.    Follow Up Recommendations Home health PT    Equipment Recommendations  None recommended by PT    Recommendations for Other Services       Precautions / Restrictions Precautions Precautions: Fall Required Braces or Orthoses:  (CAM walker RLE for all weight bearing) Restrictions Weight Bearing Restrictions: No Other Position/Activity Restrictions: pt is allowec to be WBAT right as long as he has his CAM walker on      Mobility  Bed Mobility Overal bed mobility: Modified Independent                Transfers Overall transfer level: Needs assistance Equipment used: Rolling walker (2 wheeled) Transfers: Sit to/from Stand Sit to Stand: Min guard            Ambulation/Gait Ambulation/Gait assistance: Supervision Ambulation Distance (Feet): 80 Feet Assistive device: Rolling walker (2 wheeled) Gait Pattern/deviations: Decreased stance time - right;Decreased weight shift to right Gait velocity: appropriate for situation Gait velocity interpretation: Below normal speed for age/gender    Stairs            Wheelchair Mobility    Modified Rankin (Stroke Patients Only)       Balance Overall  balance assessment: Needs assistance Sitting-balance support: No upper extremity supported;Feet supported Sitting balance-Leahy Scale: Good     Standing balance support: Bilateral upper extremity supported Standing balance-Leahy Scale: Fair                               Pertinent Vitals/Pain Pain Assessment: No/denies pain    Home Living Family/patient expects to be discharged to:: Private residence   Available Help at Discharge: Family;Personal care attendant;Available 24 hours/day Type of Home: House Home Access: Stairs to enter   [employer] of Steps: 1 Home Layout: One level;Laundry or work area in [place]: Kasandra Knudsen - single point;Wheelchair - [employer] - 2 wheels;Tub bench      Prior Function Level of Independence: Needs assistance   Gait / Transfers Assistance Needed: ambulates with SBA  ADL's / Homemaking Assistance Needed: assist with all personal ADLs and household ADLs        Hand Dominance        Extremity/Trunk Assessment               Lower Extremity Assessment: Generalized weakness;RLE deficits/detail RLE Deficits / Details: right ankle fracture, unhealed       Communication   Communication: No difficulties  Cognition Arousal/Alertness: Awake/alert Behavior During Therapy: WFL for tasks assessed/performed Overall Cognitive Status: History of cognitive impairments -  at baseline                      General Comments      Exercises        Assessment/Plan    PT Assessment All further PT needs can be met in the next venue of care  PT Diagnosis Difficulty walking;Generalized weakness   PT Problem List Decreased strength;Decreased activity tolerance;Decreased balance;Decreased mobility  PT Treatment Interventions     PT Goals (Current goals can be found in the Care Plan section) Acute Rehab PT Goals PT Goal Formulation: All assessment and education complete, DC therapy    Frequency      Barriers to discharge        Co-evaluation               End of Session Equipment Utilized During Treatment: Gait belt;Oxygen Activity Tolerance: Patient tolerated treatment well Patient left: in chair;with call bell/phone within reach;with chair alarm set;with family/visitor present           Time: 4108-5790 PT Time Calculation (min) (ACUTE ONLY): 32 min   Charges:   PT Evaluation $Initial PT Evaluation Tier I: 1 Procedure     PT G CodesDemetrios Isaacs L 08/18/2014, 9:14 AM

## 2014-08-18 NOTE — Progress Notes (Signed)
[facility] PROGRESS NOTE  [name] [name] MCN:[id number] DOB: 01-29-1934 DOA: 08/17/2014 PCP: Purvis Kilts, MD  Assessment/Plan:  acute on chronic systolic. Chart review indicates he had an ST segment elevation myocardial infarction treated with intervention of his right coronary artery by Dr. Ellyn Hack in September 2015 and then underwent coronary artery bypass. Chart indicates EF 45-50%. Repeat echo yields ef 45%. Volume status -2.3L. Weight 95.3kg down from 97.1kg. LE edema improved as well.  Continue IV lasix 40 BID. Appreciate cardiology assistance  Active Problems: Acute renal failure superimposed on stage 3 chronic kidney disease: s/p nephrectomy 05/2013. Likely related to #1. Creatinine trending down. Baseline creatinine is in the range of 1.2-1.4. Will continue Lasix as noted above. Monitor intake and output.   Elevated troponin: hx of same but not as high as 1.28. EKG without acute changes. Evaluated by cardiology who opine may represent demand ischemia in setting of acute systolic heart failure. Recommend avoiding coronary angiography if at all possible given CKD and having only one kidney.  Will continue aspirin. Monitor on telemetry. .  DM type 2 (diabetes mellitus, type 2) diet controlled. Hemoglobin A1c 6.1. Will monitor  Obstructive sleep apnea: chart review indicates he wears a C Pap occasionally.    CAD, multiple vessel: Denies chest pain. Elevated troponin as noted above was no acute EKG changes. Chart review indicates evaluated by cardiology in January of this year for preop clearance. Note indicates Myoview performed 2 years prior that was nonischemic. In addition history of ST segment elevation with cath and CABG as noted above. Evaluated by Dr Jacinta Shoe. See above  Hypotension. Patient is on Midodrin and Florinef. Blood pressure range 97-123.    Hyperlipidemia: Chart review indicates is intolerant to statin drugs. Lipid panel to be checked as OP per  cards  Hypothyroidism:  TSH 5.3. Follow OP  Vascular dementia: stable at baseline.      Code Status: DNR Family Communication: daughter at bedside Disposition Plan: home when ready   Consultants:  cardiology  Procedures:  Echo Systolic function was mildly to moderately reduced. The estimated ejection fraction was in the range of 40% to 45%. Findings consistent with left ventricular diastolic dysfunction. Doppler parameters are consistent with high ventricular filling pressure.  Antibiotics:  none  HPI/Subjective: Awake alert. Reports feeling better  Objective: Filed Vitals:   08/18/14 1426  BP: 97/61  Pulse: 59  Temp: 98 F (36.7 C)  Resp: 20    Intake/Output Summary (Last 24 hours) at 08/18/14 1526 Last data filed at 08/18/14 1200  Gross per 24 hour  Intake    840 ml  Output   1800 ml  Net   -960 ml   Filed Weights   08/17/14 0844 08/17/14 1259 08/18/14 0529  Weight: 97.523 kg (215 lb) 97.115 kg (214 lb 1.6 oz) 95.391 kg (210 lb 4.8 oz)    Exam:   General:  Well nourished appears comfortable  Cardiovascular: RRR No MGR trace edema LE PPP  Respiratory: normal effort BS with fair air flow. Fine crackles   Abdomen: obese soft +BS   Musculoskeletal: no clubbing or cyanosis   Data Reviewed: Basic Metabolic Panel:  Recent Labs Lab 08/17/14 0900 08/18/14 0400  NA 141 140  K 4.0 3.4*  CL 108 105  CO2 25 27  GLUCOSE 124* 106*  BUN 23* 24*  CREATININE 2.03* 1.95*  CALCIUM 9.2 8.8*   Liver Function Tests:  Recent Labs Lab 08/17/14 0900  AST 29  ALT 40  ALKPHOS  88  BILITOT 0.5  PROT 6.6  ALBUMIN 3.0*   No results for input(s): LIPASE, AMYLASE in the last 168 hours. No results for input(s): AMMONIA in the last 168 hours. CBC:  Recent Labs Lab 08/17/14 0900 08/18/14 0400  WBC 10.4 9.2  NEUTROABS 8.2*  --   HGB 10.6* 10.5*  HCT 33.8* 33.1*  MCV 91.1 90.9  PLT 408* 388   Cardiac Enzymes:  Recent Labs Lab  08/17/14 0900 08/17/14 1621 08/17/14 2210 08/18/14 0400 08/18/14 1042  TROPONINI 1.28* 1.04* 0.89* 0.91* 0.80*   BNP (last 3 results)  Recent Labs  08/17/14 0900  BNP 2588.0*    ProBNP (last 3 results) No results for input(s): PROBNP in the last 8760 hours.  CBG:  Recent Labs Lab 08/17/14 1610 08/17/14 2236 08/18/14 0712 08/18/14 1100  GLUCAP 108* 135* 93 110*    No results found for this or any previous visit (from the past 240 hour(s)).   Studies: Dg Chest Portable 1 View  08/17/2014   CLINICAL DATA:  Shortness of breath for 2 weeks.  EXAM: PORTABLE CHEST - 1 VIEW  COMPARISON:  06/12/2014  FINDINGS: There is patchy bilateral lung opacity which is new from previous, most notable at bases and in the left perihilar lung. Possible small pleural effusions.  Stable cardiomegaly and aortic tortuosity. The patient is status post CABG. No pneumothorax.  IMPRESSION: Bilateral lung opacity which could reflect multi focal pneumonia or asymmetric edema.   Electronically Signed   By: Monte Fantasia M.D.   On: 08/17/2014 09:36    Scheduled Meds: . antiseptic oral rinse  7 mL Mouth Rinse BID  . aspirin EC  325 mg Oral Daily  . donepezil  10 mg Oral QHS  . enoxaparin (LOVENOX) injection  40 mg Subcutaneous QHS  . escitalopram  20 mg Oral Daily  . feeding supplement (ENSURE ENLIVE)  237 mL Oral TID BM  . fludrocortisone  0.1 mg Oral BID  . furosemide  40 mg Intravenous Q12H  . insulin aspart  0-15 Units Subcutaneous TID WC  . insulin aspart  0-5 Units Subcutaneous QHS  . levothyroxine  125 mcg Oral QAC breakfast  . midodrine  10 mg Oral BID  . pantoprazole  40 mg Oral q morning - 10a  . polyethylene glycol  17 g Oral QHS  . sodium chloride  3 mL Intravenous Q12H  . sodium chloride  3 mL Intravenous Q12H  . ziprasidone  20 mg Oral BID WC   Continuous Infusions:   Principal Problem:   CHF (congestive heart failure) Active Problems:   Hyperlipidemia   Obstructive sleep  apnea   CAD, multiple vessel   DM type 2 (diabetes mellitus, type 2)   Elevated troponin   Acute renal failure superimposed on stage 3 chronic kidney disease   Acute on chronic diastolic heart failure   Protein-calorie malnutrition, severe    Time spent: 30 minutes    [facility] Pager [phone]. If 7PM-7AM, please contact night-coverage at [email or web address], password [facility] 08/18/2014, 3:26 PM  LOS: 1 day

## 2014-08-18 NOTE — Progress Notes (Signed)
PT Cancellation Note  Patient Details Name: [name] [name] MRN: [id number] DOB: 12-04-1933   Cancelled Treatment:    Reason Eval/Treat Not Completed: Other (comment).  Pt was seen this morning for a PT evaluation.  He is close to functional baseline and I have asked nursing service to have him ambulate daily with a walker and CAM boot.  We are planning to resume his HHPT at d/c.  The evaluation note is included in the chart.   Demetrios Isaacs L 08/18/2014, 3:30 PM

## 2014-08-18 NOTE — Care Management Note (Signed)
Case Management Note  Patient Details  Name: [name] [name] MRN: [id number] Date of Birth: 11/13/1933  Subjective/Objective:                  Pt admitted from home with CHF. Pt lives at home with 24 hour caregivers in the home. Pt has cane, walker, and w/c for home use.  Action/Plan: PT recommends HH PT at discharge. Will arrange with agency of pts choice.  Expected Discharge Date:  08/19/14               Expected Discharge Plan:  Home/Self Care (Pt has 24 hour caregivers in the home)  In-House Referral:  NA  Discharge planning Services  CM Consult  Post Acute Care Choice:    Choice offered to:     DME Arranged:    DME Agency:     HH Arranged:    HH Agency:     Status of Service:  In process, will continue to follow  Medicare Important Message Given:    Date Medicare IM Given:    Medicare IM give by:    Date Additional Medicare IM Given:    Additional Medicare Important Message give by:     If discussed at [place] of Stay Meetings, dates discussed:    Additional Comments:  Joylene Draft, RN 08/18/2014, 1:41 PM

## 2014-08-18 NOTE — Progress Notes (Signed)
UR chart review completed.  

## 2014-08-19 DIAGNOSIS — I5020 Unspecified systolic (congestive) heart failure: Secondary | ICD-10-CM

## 2014-08-19 DIAGNOSIS — Z7189 Other specified counseling: Secondary | ICD-10-CM

## 2014-08-19 DIAGNOSIS — I824Z9 Acute embolism and thrombosis of unspecified deep veins of unspecified distal lower extremity: Secondary | ICD-10-CM | POA: Diagnosis present

## 2014-08-19 DIAGNOSIS — I25118 Atherosclerotic heart disease of native coronary artery with other forms of angina pectoris: Secondary | ICD-10-CM

## 2014-08-19 DIAGNOSIS — I509 Heart failure, unspecified: Secondary | ICD-10-CM | POA: Diagnosis present

## 2014-08-19 DIAGNOSIS — E43 Unspecified severe protein-calorie malnutrition: Secondary | ICD-10-CM

## 2014-08-19 DIAGNOSIS — G4733 Obstructive sleep apnea (adult) (pediatric): Secondary | ICD-10-CM

## 2014-08-19 DIAGNOSIS — I82401 Acute embolism and thrombosis of unspecified deep veins of right lower extremity: Secondary | ICD-10-CM

## 2014-08-19 LAB — GLUCOSE, CAPILLARY
GLUCOSE-CAPILLARY: 115 mg/dL — AB (ref 70–99)
GLUCOSE-CAPILLARY: 114 mg/dL — AB (ref 70–99)
Glucose-Capillary: 127 mg/dL — ABNORMAL HIGH (ref 70–99)
Glucose-Capillary: 112 mg/dL — ABNORMAL HIGH (ref 70–99)

## 2014-08-19 LAB — CBC
HEMATOCRIT: 33.5 % — AB (ref 39.0–52.0)
Hemoglobin: 10.3 g/dL — ABNORMAL LOW (ref 13.0–17.0)
MCH: 27.8 pg (ref 26.0–34.0)
MCHC: 30.7 g/dL (ref 30.0–36.0)
MCV: 90.3 fL (ref 78.0–100.0)
Platelets: 392 10*3/uL (ref 150–400)
RBC: 3.71 MIL/uL — ABNORMAL LOW (ref 4.22–5.81)
RDW: 15.1 % (ref 11.5–15.5)
WBC: 8.1 10*3/uL (ref 4.0–10.5)

## 2014-08-19 LAB — BASIC METABOLIC PANEL
Anion gap: 7 (ref 5–15)
BUN: 24 mg/dL — ABNORMAL HIGH (ref 6–20)
CHLORIDE: 98 mmol/L — AB (ref 101–111)
CO2: 34 mmol/L — AB (ref 22–32)
CREATININE: 2.03 mg/dL — AB (ref 0.61–1.24)
Calcium: 9.2 mg/dL (ref 8.9–10.3)
GFR calc Af Amer: 34 mL/min — ABNORMAL LOW (ref >60)
GFR calc non Af Amer: 30 mL/min — ABNORMAL LOW (ref >60)
GLUCOSE: 107 mg/dL — AB (ref 70–99)
Potassium: 3.6 mmol/L (ref 3.5–5.1)
Sodium: 139 mmol/L (ref 135–145)

## 2014-08-19 MED ORDER — FUROSEMIDE 40 MG PO TABS
40.0000 mg | ORAL_TABLET | Freq: Every day | ORAL | Status: DC
  Administered 2014-08-19 – 2014-08-20 (×2): 40 mg via ORAL
  Filled 2014-08-19 (×2): qty 1

## 2014-08-19 MED ORDER — RIVAROXABAN 20 MG PO TABS: 20.0000 mg | ORAL_TABLET | Freq: Every day | ORAL | Status: DC

## 2014-08-19 MED ORDER — RIVAROXABAN 15 MG PO TABS
15.0000 mg | ORAL_TABLET | Freq: Two times a day (BID) | ORAL | Status: DC
  Administered 2014-08-19 – 2014-08-20 (×2): 15 mg via ORAL
  Filled 2014-08-19 (×2): qty 1

## 2014-08-19 NOTE — Plan of Care (Signed)
Problem: Consults Goal: Heart Failure Patient Education (See Patient Education module for education specifics.)  Outcome: Progressing Patient educated on daily weights, monitoring fluids intake and output, and importance of ambulation/exercise. Reviewed HF booklet.  Problem: Phase I Progression Outcomes Goal: Pain controlled with appropriate interventions Outcome: Completed/Met Date Met:  08/19/14 Patient denies any pain. Goal: Up in chair, BRP Outcome: Completed/Met Date Met:  08/19/14 Patient assisted to chair today after ambulating in the hall. Patient remained in chair for a couple hours and returned to bed after lunch.     

## 2014-08-19 NOTE — Progress Notes (Signed)
ANTICOAGULATION CONSULT NOTE - INITIAL  Pharmacy Consult for Xarelto Indication: DVT  Allergies  Allergen Reactions  . Lorazepam     Hallucinations, worsens agitation  . Statins Other (See Comments)    MUSCLE ACHES    Patient Measurements: Height: 5\' 10"  (177.8 cm) Weight: 210 lb 4.8 oz (95.391 kg) IBW/kg (Calculated) : 73  Vital Signs: Temp: 98.1 F (36.7 C) (05/11 0520) Temp Source: Oral (05/11 0520) BP: 119/57 mmHg (05/11 0803) Pulse Rate: 59 (05/11 0803) Intake/Output from previous day: 05/10 0701 - 05/11 0700 In: 969 [P.O.:960; I.V.:9] Out: 3400 [Urine:3400] Intake/Output from this shift: Total I/O In: 240 [P.O.:240] Out: 1250 [Urine:1250]  Labs:  Recent Labs  08/17/14 0900 08/18/14 0400 08/19/14 0556  WBC 10.4 9.2 8.1  HGB 10.6* 10.5* 10.3*  PLT 408* 388 392  CREATININE 2.03* 1.95* 2.03*   Estimated Creatinine Clearance: 34.8 mL/min (by C-G formula based on Cr of 2.03). No results for input(s): VANCOTROUGH, VANCOPEAK, VANCORANDOM, GENTTROUGH, GENTPEAK, GENTRANDOM, TOBRATROUGH, TOBRAPEAK, TOBRARND, AMIKACINPEAK, AMIKACINTROU, AMIKACIN in the last 72 hours.   Microbiology: No results found for this or any previous visit (from the past 720 hour(s)).  Medical History: Past Medical History  Diagnosis Date  . Chronic constipation   . GERD (gastroesophageal reflux disease)   . Arthritis   . Hyperlipidemia   . Orthostatic hypotension 11/19/2013  . Coronary artery disease     CARDIOLOGIST-  DR BERRY  --  HX  STEMI  12-17-2013  S/P  CABG X4  12-18-2013  . Mild dementia   . Type 2 diabetes mellitus   . Hypothyroidism   . History of esophageal dilatation   . History of gastritis   . History of hiatal hernia   . History of bladder cancer     hx  TCC low grade  . Prostate cancer     low-grade  . Kidney tumor     right  . BPH (benign prostatic hyperplasia)   . CKD (chronic kidney disease) stage 3, GFR 30-59 ml/min   . History of kidney stones   . ED  (erectile dysfunction)   . S/P CABG x 4     12-18-2013  . Bladder tumor   . Complication of anesthesia     HARD TO WAKE  . OSA on CPAP     mild osa   per sleep study 07-11-2009- patient states does not use regularly   Assessment: 81yo male with right leg DVT.  Asked to initiate Xarelto for DVT after long discussion with family.  Pt is a fall risk.  Per cardiology, can consider an abbreviated course of therapy with Xarelto (3-6 months).  Estimated clcr ~ 30-35 which makes renal fxn borderline as contraindication for Xarelto.  Will need to monitor.  Plan:   Xarelto 15mg  po BID x 21 days then  Xarelto 20mg  po once daily w/ supper  Provide Xarelto education to family / patient  Monitor renal fxn and for s/sx of bleeding complications  Lovenox d/c'd  Hart Robinsons A 08/19/2014,1:22 PM

## 2014-08-19 NOTE — Progress Notes (Signed)
SATURATION QUALIFICATIONS: (This note is used to comply with regulatory documentation for home oxygen)  Patient Saturations on Room Air at Rest = 92%  Patient Saturations on Room Air while Ambulating = 93%  Patient Saturations on X Liters of oxygen while Ambulating = X%  Please briefly explain why patient needs home oxygen: N/A

## 2014-08-19 NOTE — Progress Notes (Signed)
[facility] PROGRESS NOTE  [name] [name] Casa JAS:[id number] DOB: 1933/09/01 DOA: 08/17/2014 PCP: Purvis Kilts, MD  Assessment/Plan: acute on chronic systolic. Chart review indicates he had an ST segment elevation myocardial infarction treated with intervention of his right coronary artery by Dr. Ellyn Hack in September 2015 and then underwent coronary artery bypass. Chart indicates EF 45-50%. Repeat echo yields ef 45%. Volume status -4.7L. Weight 95.3kg down from 97.1kg. LE edema improved as well. evaluated by cards who recommend lasix po daily.   Active Problems: Acute renal failure superimposed on stage 3 chronic kidney disease: s/p nephrectomy 05/2013. Likely related to #1. Creatinine trending up today. Baseline creatinine is in the range of 1.2-1.4. Will continue Lasix as noted above. .   Elevated troponin: hx of same but not as high as 1.28. EKG without acute changes. Evaluated by cardiology who opine may represent demand ischemia in setting of acute systolic heart failure. Recommend avoiding coronary angiography if at all possible given CKD and having only one kidney. Will continue aspirin. No events on tele.   DM type 2 (diabetes mellitus, type 2) diet controlled. CBG 115.  Hemoglobin A1c 6.1. Will monitor  Obstructive sleep apnea: chart review indicates he wears a C Pap occasionally.    CAD, multiple vessel: Denies chest pain. Elevated troponin as noted above with no acute EKG changes. Chart review indicates evaluated by cardiology in January of this year for preop clearance. Note indicates Myoview performed 2 years prior that was nonischemic. In addition history of ST segment elevation with cath and CABG as noted above. Evaluated by Dr Jacinta Shoe. See above  Hypotension. Patient is on Midodrin and Florinef. Blood pressure range 88-119.    Hyperlipidemia: Chart review indicates is intolerant to statin drugs. Lipid panel to be checked as OP per cards  Hypothyroidism: TSH  5.3. Follow OP  Vascular dementia: stable at baseline.   DVT right leg: poor anticoagulation candidate due to orthostatic hypotension and high risk fall. Also concern for PE. Risk/benefits of anticoagulation discussed with family and short term xarelto decided upon. Will ask pharmacy to dose.     Code Status: DNR Family Communication: family at bedside Disposition Plan: home in am   Consultants:  cardiology  Procedures:  Echo Systolic function was mildly to moderately reduced. The estimated ejection fraction was in the range of 40% to 45%. Findings consistent with left ventricular diastolic dysfunction. Doppler parameters are consistent with high ventricular filling pressure.  Antibiotics:  none  HPI/Subjective: Awake denies pain/discomfort or SOB  Objective: Filed Vitals:   08/19/14 0803  BP: 119/57  Pulse: 59  Temp:   Resp:     Intake/Output Summary (Last 24 hours) at 08/19/14 1319 Last data filed at 08/19/14 1047  Gross per 24 hour  Intake    489 ml  Output   3250 ml  Net  -2761 ml   Filed Weights   08/17/14 1259 08/18/14 0529 08/19/14 0520  Weight: 97.115 kg (214 lb 1.6 oz) 95.391 kg (210 lb 4.8 oz) 95.391 kg (210 lb 4.8 oz)    Exam:   General:  Well nourished calm cooperative  Cardiovascular: RRR + murmur right leg trace LE edema left leg no edema  Respiratory: normal effort BS clear bilaterally no crackles  Abdomen: non-distended + BS  Musculoskeletal: no clubbing or cyanosis   Data Reviewed: Basic Metabolic Panel:  Recent Labs Lab 08/17/14 0900 08/18/14 0400 08/19/14 0556  NA 141 140 139  K 4.0 3.4* 3.6  CL 108 105 98*  CO2 25 27 34*  GLUCOSE 124* 106* 107*  BUN 23* 24* 24*  CREATININE 2.03* 1.95* 2.03*  CALCIUM 9.2 8.8* 9.2   Liver Function Tests:  Recent Labs Lab 08/17/14 0900  AST 29  ALT 40  ALKPHOS 88  BILITOT 0.5  PROT 6.6  ALBUMIN 3.0*   No results for input(s): LIPASE, AMYLASE in the last 168  hours. No results for input(s): AMMONIA in the last 168 hours. CBC:  Recent Labs Lab 08/17/14 0900 08/18/14 0400 08/19/14 0556  WBC 10.4 9.2 8.1  NEUTROABS 8.2*  --   --   HGB 10.6* 10.5* 10.3*  HCT 33.8* 33.1* 33.5*  MCV 91.1 90.9 90.3  PLT 408* 388 392   Cardiac Enzymes:  Recent Labs Lab 08/17/14 0900 08/17/14 1621 08/17/14 2210 08/18/14 0400 08/18/14 1042  TROPONINI 1.28* 1.04* 0.89* 0.91* 0.80*   BNP (last 3 results)  Recent Labs  08/17/14 0900  BNP 2588.0*    ProBNP (last 3 results) No results for input(s): PROBNP in the last 8760 hours.  CBG:  Recent Labs Lab 08/18/14 0712 08/18/14 1100 08/18/14 1613 08/18/14 2114 08/19/14 0755  GLUCAP 93 110* 104* 110* 115*    No results found for this or any previous visit (from the past 240 hour(s)).   Studies: US Venous Img Lower Unilateral Right  08/18/2014   CLINICAL DATA:  Right lower extremity swelling. Post fall (07/2014). History of renal cell carcinoma. Former smoker. Evaluate for DVT.  EXAM: RIGHT LOWER EXTREMITY VENOUS DOPPLER ULTRASOUND  TECHNIQUE: Gray-scale sonography with graded compression, as well as color Doppler and duplex ultrasound were performed to evaluate the lower extremity deep venous systems from the level of the common femoral vein and including the common femoral, femoral, profunda femoral, popliteal and calf veins including the posterior tibial, peroneal and gastrocnemius veins when visible. The superficial great saphenous vein was also interrogated. Spectral Doppler was utilized to evaluate flow at rest and with distal augmentation maneuvers in the common femoral, femoral and popliteal veins.  COMPARISON:  None.  FINDINGS: Contralateral Common Femoral Vein: Respiratory phasicity is normal and symmetric with the symptomatic side. No evidence of thrombus. Normal compressibility.  Common Femoral Vein: No evidence of thrombus. Normal compressibility, respiratory phasicity and response to  augmentation.  Saphenofemoral Junction: No evidence of thrombus. Normal compressibility and flow on color Doppler imaging.  Profunda Femoral Vein: No evidence of thrombus. Normal compressibility and flow on color Doppler imaging.  Femoral Vein: No evidence of thrombus. Normal compressibility, respiratory phasicity and response to augmentation.  Popliteal Vein: No evidence of thrombus. Normal compressibility, respiratory phasicity and response to augmentation.  Calf Veins: There is there is hypoechoic, occlusive thrombus within 1 of the paired posterior tibial veins (representative images 29 through 36). The peroneal vein is suboptimally visualized.  Superficial Great Saphenous Vein: No evidence of thrombus. Normal compressibility and flow on color Doppler imaging.  Venous Reflux:  None.  Other Findings:  None.  IMPRESSION: Examination is positive for DVT within one of the paired posterior tibial veins. There is no extension of this distal DVT to involve the more proximal deep venous system of the right lower extremity.   Electronically Signed   By: Sandi Mariscal M.D.   On: 08/18/2014 16:10    Scheduled Meds: . antiseptic oral rinse  7 mL Mouth Rinse BID  . aspirin EC  325 mg Oral Daily  . donepezil  10 mg Oral QHS  . escitalopram  20 mg Oral Daily  . feeding  supplement (ENSURE ENLIVE)  237 mL Oral TID BM  . fludrocortisone  0.1 mg Oral BID  . furosemide  40 mg Oral Daily  . insulin aspart  0-15 Units Subcutaneous TID WC  . insulin aspart  0-5 Units Subcutaneous QHS  . levothyroxine  125 mcg Oral QAC breakfast  . midodrine  10 mg Oral BID  . pantoprazole  40 mg Oral q morning - 10a  . polyethylene glycol  17 g Oral QHS  . sodium chloride  3 mL Intravenous Q12H  . sodium chloride  3 mL Intravenous Q12H  . ziprasidone  20 mg Oral BID WC   Continuous Infusions:   Principal Problem:   CHF (congestive heart failure) Active Problems:   Hyperlipidemia   Obstructive sleep apnea   CAD, multiple  vessel   DM type 2 (diabetes mellitus, type 2)   Elevated troponin   Acute renal failure superimposed on stage 3 chronic kidney disease   Acute on chronic diastolic heart failure   Protein-calorie malnutrition, severe    Time spent: 20 minutes    [facility] Pager [phone]. If 7PM-7AM, please contact night-coverage at [email or web address], password [facility] 08/19/2014, 1:19 PM  LOS: 2 days

## 2014-08-19 NOTE — Progress Notes (Addendum)
Subjective:  Breathing improved. Spoke with sister in law, Vaughan Basta, who says someone is with him all the time at home. If he gets dizzy someone is usually there to help get him a chair. He hasn't fallen recently.  Objective:  Vital Signs in the last 24 hours: Temp:  [98 F (36.7 C)-98.3 F (36.8 C)] 98.1 F (36.7 C) (05/11 0520) Pulse Rate:  [59-64] 59 (05/11 0803) Resp:  [20] 20 (05/11 0520) BP: (88-119)/(57-63) 119/57 mmHg (05/11 0803) SpO2:  [99 %-100 %] 99 % (05/11 0520) Weight:  [210 lb 4.8 oz (95.391 kg)] 210 lb 4.8 oz (95.391 kg) (05/11 0520)  Intake/Output from previous day: 05/10 0701 - 05/11 0700 In: 969 [P.O.:960; I.V.:9] Out: 3400 [Urine:3400] Intake/Output from this shift:    Physical Exam: NECK: Without JVD, HJR, or bruit LUNGS: Decreased breath sounds but Clear anterior, posterior, lateral HEART: Regular rate and GGYIRS,8/5 systolic murmur LSB, S4, no rub, bruit, thrill, or heave EXTREMITIES: Right leg edema, left Without cyanosis, clubbing, or edema   Lab Results:  Recent Labs  08/18/14 0400 08/19/14 0556  WBC 9.2 8.1  HGB 10.5* 10.3*  PLT 388 392    Recent Labs  08/18/14 0400 08/19/14 0556  NA 140 139  K 3.4* 3.6  CL 105 98*  CO2 27 34*  GLUCOSE 106* 107*  BUN 24* 24*  CREATININE 1.95* 2.03*    Recent Labs  08/18/14 0400 08/18/14 1042  TROPONINI 0.91* 0.80*   Hepatic Function Panel  Recent Labs  08/17/14 0900  PROT 6.6  ALBUMIN 3.0*  AST 29  ALT 40  ALKPHOS 88  BILITOT 0.5   No results for input(s): CHOL in the last 72 hours. No results for input(s): PROTIME in the last 72 hours.  Imaging:  IMPRESSION: Examination is positive for DVT within one of the paired posterior tibial veins. There is no extension of this distal DVT to involve the more proximal deep venous system of the right lower extremity.     Electronically Signed   By: Sandi Mariscal M.D.   On: 08/18/2014 16:10    Cardiac Studies: Study Conclusions  -  Left ventricle: The cavity size was mildly dilated. Wall   thickness was normal. Systolic function was mildly to moderately   reduced. The estimated ejection fraction was in the range of 40%   to 45%. Findings consistent with left ventricular diastolic   dysfunction. Doppler parameters are consistent with high   ventricular filling pressure. - Regional wall motion abnormality: Mild hypokinesis of the   basal-mid inferior and basal-mid inferolateral myocardium. - Ventricular septum: Septal motion showed abnormal function and   dyssynergy. These changes are consistent with intraventricular   conduction delay. - Aortic valve: Moderately calcified annulus. - Mitral valve: There was moderate eccentric regurgitation. - Right ventricle: The cavity size was mildly dilated. Systolic   function was moderately to severely reduced. - Tricuspid valve: There was mild-moderate regurgitation. - Pulmonic valve: Moderately elevated pulmonary pressures (48   mmHg). - Systemic veins: Dilated IVC, with normal respiratory variation.   Estimated CVP 8 mmHg.  Assessment/Plan:   1. Elevated troponin in context of CAD, prior STEMI, and CABG, as well as acute on chronic systolic heart failure: Troponin elevation may be a representation of demand ischemia in context of acute systolic heart failure, and is trending down. Also see #4. In any event, want to avoid coronary angiography if at all possible given CKD stage III with one kidney, as he is at very high  risk of contrast-mediated nephropathy which could result in hemodialysis requirement. Intolerant to statin therapy. Would not heparinize at this point. Continue ASA. ECG without acute changes. LV systolic function is essentially stable, EF 45%.  2. Acute on chronic systolic heart failure: Over 2.4 L output on IV Lasix 40 mg bid with reduced right leg edema. Will continue with close BP and renal function monitoring. May want to switch to po Lasix with rising  Crt.  3. Orthostatic hypotension: Continue midodrine and Florinef.  4. Hyperlipidemia: Intolerant to statins. Check lipid panel as outpatient.  5. Moderate to severely reduced RV systolic function: Given desaturations, elevated troponin, and overall symptomatology, need to consider pulmonary embolism in differential diagnosis. LV systolic function is essentially stable, EF 45%. Would want to avoid CT angiogram of chest with contrast due to solitary kidney. Uncertain about image quality of VQ scan given CHF. Breathing has actually improved with diuresis.  6.Right Leg DVT- he is a poor anticoagulation candidate due to orthostatic hypotension with frequent falls, as he would be at risk for an intracranial bleed, but he is at high risk for PE. Would consider short term anticoagulation.   Ermalinda Barrios 08/19/2014, 9:28 AM   The patient was seen and examined, and I agree with the assessment and plan as documented above, with modifications as noted below. Feeling better and has put out another 2.4 L. Given rising SCr, will switch to 40 mg po daily. RLE [place] positive for DVT. Had a long discussion with family members regarding risks/benefits of anticoagulation given his h/o orthostatic hypotension and falls. They will wait to speak with [name] (daughter-in-law) before making a final decision. Could consider abbreviated course of therapy with Xarelto (3-6 months).

## 2014-08-19 NOTE — Progress Notes (Signed)
   08/19/14 1115  Mobility  Activity Ambulate in hall  Level of Assistance Modified independent, requires aide device or extra time  Assistive Device Front wheel walker  RLE Weight Bearing WBAT  Distance Ambulated (ft) 100 ft  Ambulation Response Tolerated fair   Per PT, patient does not ambulate long distances at home, and pt walked further today than yesterday. Patient was unable to walk back to room due to weakness in legs, but was still maintaining O2 saturation of 93%.

## 2014-08-19 NOTE — Progress Notes (Signed)
   08/19/14 1840  Mobility  Activity Ambulate in room  Level of Assistance Maximum assist, patient does 25-49%  Assistive Device Front wheel walker  RLE Weight Bearing WBAT  Distance Ambulated (ft) 2 ft  Ambulation Response Tolerated poorly   Attempted to ambulate patient in hall with two-assist and front-wheel walker. Patient stood up from chair and started walking toward door.  He stated "I need to sit down". Patient's legs buckled, and RN, NT, and family member assisted patient to sit down on bed. When asked what he felt before his legs gave out, patient said he felt "weak", but denied dizziness. O2 saturation was 96% on RA and HR was 63 bpm. Will continue to monitor.

## 2014-08-20 DIAGNOSIS — I824Z1 Acute embolism and thrombosis of unspecified deep veins of right distal lower extremity: Secondary | ICD-10-CM

## 2014-08-20 DIAGNOSIS — Z905 Acquired absence of kidney: Secondary | ICD-10-CM

## 2014-08-20 DIAGNOSIS — F0390 Unspecified dementia without behavioral disturbance: Secondary | ICD-10-CM

## 2014-08-20 DIAGNOSIS — Z951 Presence of aortocoronary bypass graft: Secondary | ICD-10-CM

## 2014-08-20 DIAGNOSIS — I2699 Other pulmonary embolism without acute cor pulmonale: Secondary | ICD-10-CM | POA: Diagnosis present

## 2014-08-20 LAB — GLUCOSE, CAPILLARY
Glucose-Capillary: 105 mg/dL — ABNORMAL HIGH (ref 65–99)
Glucose-Capillary: 144 mg/dL — ABNORMAL HIGH (ref 65–99)

## 2014-08-20 LAB — CBC
HCT: 35.2 % — ABNORMAL LOW (ref 39.0–52.0)
Hemoglobin: 11 g/dL — ABNORMAL LOW (ref 13.0–17.0)
MCH: 28.2 pg (ref 26.0–34.0)
MCHC: 31.3 g/dL (ref 30.0–36.0)
MCV: 90.3 fL (ref 78.0–100.0)
PLATELETS: 389 10*3/uL (ref 150–400)
RBC: 3.9 MIL/uL — ABNORMAL LOW (ref 4.22–5.81)
RDW: 15.2 % (ref 11.5–15.5)
WBC: 8 10*3/uL (ref 4.0–10.5)

## 2014-08-20 LAB — BASIC METABOLIC PANEL
Anion gap: 8 (ref 5–15)
BUN: 28 mg/dL — ABNORMAL HIGH (ref 6–20)
CO2: 35 mmol/L — ABNORMAL HIGH (ref 22–32)
CREATININE: 1.83 mg/dL — AB (ref 0.61–1.24)
Calcium: 9.2 mg/dL (ref 8.9–10.3)
Chloride: 95 mmol/L — ABNORMAL LOW (ref 101–111)
GFR calc Af Amer: 39 mL/min — ABNORMAL LOW (ref >60)
GFR calc non Af Amer: 34 mL/min — ABNORMAL LOW (ref >60)
Glucose, Bld: 111 mg/dL — ABNORMAL HIGH (ref 65–99)
Potassium: 3.5 mmol/L (ref 3.5–5.1)
SODIUM: 138 mmol/L (ref 135–145)

## 2014-08-20 MED ORDER — OXYMETAZOLINE HCL 0.05 % NA SOLN
3.0000 | Freq: Two times a day (BID) | NASAL | Status: DC | PRN
  Administered 2014-08-20: 3 via NASAL
  Filled 2014-08-20: qty 15

## 2014-08-20 MED ORDER — ENSURE ENLIVE PO LIQD: 237.0000 mL | Freq: Three times a day (TID) | ORAL | Status: DC

## 2014-08-20 MED ORDER — RIVAROXABAN (XARELTO) VTE STARTER PACK (15 & 20 MG)
ORAL_TABLET | ORAL | Status: DC
Start: 2014-08-20 — End: 2014-09-19

## 2014-08-20 MED ORDER — FUROSEMIDE 40 MG PO TABS: 40.0000 mg | ORAL_TABLET | Freq: Every day | ORAL | Status: DC

## 2014-08-20 MED ORDER — ASPIRIN 81 MG PO CHEW: 81.0000 mg | CHEWABLE_TABLET | Freq: Every day | ORAL | Status: DC

## 2014-08-20 MED ORDER — ASPIRIN 81 MG PO CHEW
81.0000 mg | CHEWABLE_TABLET | Freq: Every day | ORAL | Status: DC
  Administered 2014-08-20: 81 mg via ORAL
  Filled 2014-08-20: qty 1

## 2014-08-20 NOTE — Discharge Instructions (Signed)
Information on my medicine - XARELTO (rivaroxaban)  This medication education was reviewed with me or my healthcare representative as part of my discharge preparation.  The pharmacist that spoke with me during my hospital stay was:  Ena Dawley, [place]? Xarelto was prescribed to treat blood clots that may have been found in the veins of your legs (deep vein thrombosis) or in your lungs (pulmonary embolism) and to reduce the risk of them occurring again.  What do you need to know about Xarelto? The starting dose is one 15 mg tablet taken TWICE daily with food for the FIRST 21 DAYS then on (enter date)  09/09/14  the dose is changed to one 20 mg tablet taken ONCE A DAY with your evening meal.  DO NOT stop taking Xarelto without talking to the health care provider who prescribed the medication.  Refill your prescription for 20 mg tablets before you run out.  After discharge, you should have regular check-up appointments with your healthcare provider that is prescribing your Xarelto.  In the future your dose may need to be changed if your kidney function changes by a significant amount.  What do you do if you miss a dose? If you are taking Xarelto TWICE DAILY and you miss a dose, take it as soon as you remember. You may take two 15 mg tablets (total 30 mg) at the same time then resume your regularly scheduled 15 mg twice daily the next day.  If you are taking Xarelto ONCE DAILY and you miss a dose, take it as soon as you remember on the same day then continue your regularly scheduled once daily regimen the next day. Do not take two doses of Xarelto at the same time.   Important Safety Information Xarelto is a blood thinner medicine that can cause bleeding. You should call your healthcare provider right away if you experience any of the following: ? Bleeding from an injury or your nose that does not stop. ? Unusual colored urine (red or dark brown) or  unusual colored stools (red or black). ? Unusual bruising for unknown reasons. ? A serious fall or if you hit your head (even if there is no bleeding).  Some medicines may interact with Xarelto and might increase your risk of bleeding while on Xarelto. To help avoid this, consult your healthcare provider or pharmacist prior to using any new prescription or non-prescription medications, including herbals, vitamins, non-steroidal anti-inflammatory drugs (NSAIDs) and supplements.  This website has more information on Xarelto: [email or web address]

## 2014-08-20 NOTE — Discharge Summary (Signed)
Physician Discharge Summary  [name] [name]:[id number] DOB: 1933/09/29 DOA: 08/17/2014  PCP: Purvis Kilts, MD  Admit date: 08/17/2014 Discharge date: 08/20/2014  Time spent: 40 minutes  Recommendations for Outpatient Follow-up:  1. Follow-up with primary care provider 3-5 days for basic metabolic panel to assess electrolyte and kidney function( 2. Follow-up with cardiology as scheduled  3. Home health PT   Discharge Diagnoses:  Principal Problem:   Acute on chronic diastolic heart failure Active Problems:   Hyperlipidemia   Obstructive sleep apnea   Orthostatic hypotension   S/P CABG x 4   Dementia without behavioral disturbance   DM type 2 (diabetes mellitus, type 2)   S/p nephrectomy   Elevated troponin   Acute renal failure superimposed on stage 3 chronic kidney disease   Protein-calorie malnutrition, severe   Congestive heart disease   Lower leg DVT (deep venous thrombosis)   Pulmonary embolism suspected   Discharge Condition: Good  Diet recommendation: Heart healthy are modified  Filed Weights   08/18/14 0529 08/19/14 0520 08/20/14 0614  Weight: 95.391 kg (210 lb 4.8 oz) 95.391 kg (210 lb 4.8 oz) 95.029 kg (209 lb 8 oz)    History of present illness:  Patient presented May nights 2016 with chief complaint of dyspnea. Past medical history significant for moderate vascular dementia as well as STEMI status post cath in 2015 and CABG. Associated symptoms include weakness chest pain shoulder pain lower extremity edema. This evaluation reveals a troponin of 1.28 a BNP of over 2500 chest x-ray significant for pulmonary edema. Was admitted for the purpose of IV diuresis 2-D echo to evaluate systolic function and follow-up on elevated troponin.  Hospital Course:  acute on chronic systolic. Chart review indicated he had an ST segment elevation myocardial infarction treated with intervention of his right coronary artery by Dr. Ellyn Hack in September 2015 and then underwent  coronary artery bypass. Chart indicates EF 45-50%. Repeat echo yielded ef 45%. He was provided with IV Lasix and diuresed well. Volume status -6.2L at discharge. Weight 95.kg down from 97.1kg. LE edema improved as well. evaluated by cards who recommend lasix po daily. Follow-up with cardiology as scheduled in 1-2 weeks. Has been instructed to weigh daily and take medications as indicated.  Active Problems: Acute renal failure superimposed on stage 3 chronic kidney disease: s/p nephrectomy 05/2013. Baseline creatinine is in the range of 1.2-1.4. Discharge creatinine 1.8 trending down from admission. Will continue Lasix by mouth at home. Recommend follow-up basic metabolic panel in 3-5 days to evaluate kidney function   Elevated troponin: hx of same but not as high as 1.28. EKG without acute changes. Evaluated by cardiology who opine may represent demand ischemia in setting of acute systolic heart failure. Recommend avoiding coronary angiography if at all possible given CKD and having only one kidney. Will continue aspirin at a lower dose given the initiation of Xarelto. No events on tele.   DM type 2 (diabetes mellitus, type 2) diet controlled. CBG 115. Hemoglobin A1c 6.1.   Obstructive sleep apnea: chart review indicates he wears a C Pap occasionally.    CAD, multiple vessel: Denied chest pain. Elevated troponin as noted above with no acute EKG changes. Chart review indicates evaluated by cardiology in January of this year for preop clearance. Note indicates Myoview performed 2 years prior that was nonischemic. In addition history of ST segment elevation with cath and CABG as noted above. Evaluated by Dr Jacinta Shoe. See above  Hypotension. Continue Midodrin and Florinef. Blood  pressure range 94-119 at discharge    Hyperlipidemia: Chart review indicates is intolerant to statin drugs. Lipid panel to be checked as OP per cards  Hypothyroidism: TSH 5.3. Follow OP  Vascular dementia: stable at  baseline.   DVT right leg: poor anticoagulation candidate due to orthostatic hypotension and high risk fall. Also concern for PE. Risk/benefits of anticoagulation discussed with family and short term xarelto decided upon. Cardiology recommending short course 3-6 months.      Procedures:  Echo Systolic function was mildly to moderately reduced. The estimated ejection fraction was in the range of 40% to 45%. Findings consistent with left ventricular diastolic dysfunction. Doppler parameters are consistent with high ventricular filling pressure.  Consultations:  Cardiology  Discharge Exam: Filed Vitals:   08/20/14 0614  BP: 107/62  Pulse: 59  Temp: 98.2 F (36.8 C)  Resp: 20    General: well nourished comfortable Cardiovascular: RRR no m/g/r right leg >left but no pitting edema Respiratory: normal effort BS clear  Discharge Instructions    Current Discharge Medication List    START taking these medications   Details  aspirin 81 MG chewable tablet Chew 1 tablet (81 mg total) by mouth daily.    feeding supplement, ENSURE ENLIVE, (ENSURE ENLIVE) LIQD Take 237 mLs by mouth 3 (three) times daily between meals. Qty: 237 mL, Refills: 12    furosemide (LASIX) 40 MG tablet Take 1 tablet (40 mg total) by mouth daily. Qty: 30 tablet, Refills: 0    Rivaroxaban (XARELTO STARTER PACK) 15 & 20 MG TBPK Take as directed on package: Start with one 15mg  tablet by mouth twice a day with food. On Day 22, switch to one 20mg  tablet once a day with food. Qty: 51 each, Refills: 0      CONTINUE these medications which have NOT CHANGED   Details  donepezil (ARICEPT) 10 MG tablet Take 10 mg by mouth at bedtime.    escitalopram (LEXAPRO) 20 MG tablet Take 20 mg by mouth daily.    fludrocortisone (FLORINEF) 0.1 MG tablet Take 0.1 mg by mouth 2 (two) times daily.    levothyroxine (SYNTHROID, LEVOTHROID) 125 MCG tablet Take 125 mcg by mouth daily before breakfast.    midodrine  (PROAMATINE) 10 MG tablet Take 10 mg by mouth 2 (two) times daily.    pantoprazole (PROTONIX) 40 MG tablet TAKE ONE TABLET EVERY MORNING Qty: 30 tablet, Refills: 4    polyethylene glycol (MIRALAX / GLYCOLAX) packet Take 17 g by mouth at bedtime.    ziprasidone (GEODON) 20 MG capsule Take 1 capsule (20 mg total) by mouth 2 (two) times daily with a meal. Qty: 60 capsule, Refills: 0      STOP taking these medications     aspirin EC 325 MG EC tablet        Allergies  Allergen Reactions  . Lorazepam     Hallucinations, worsens agitation  . Statins Other (See Comments)    MUSCLE ACHES   Follow-up Information    Follow up with Purvis Kilts, MD.   Specialty:  Family Medicine   Contact information:   [address] [place] [address] [phone]        The results of significant diagnostics from this hospitalization (including imaging, microbiology, ancillary and laboratory) are listed below for reference.    Significant Diagnostic Studies: Dg Ankle Complete Right  07/31/2014   CLINICAL DATA:  Right ankle pain and swelling  EXAM: RIGHT ANKLE - COMPLETE 3+ VIEW  COMPARISON:  None.  FINDINGS: Considerable soft tissue swelling is noted laterally. Minimally displaced fracture of the distal fibula is noted. No tibial fracture is seen. A small plantar spur is noted.  IMPRESSION: Mildly displaced distal fibular fracture with associated soft tissue changes   Electronically Signed   By: Inez Catalina M.D.   On: 07/31/2014 17:00   US Venous Img Lower Unilateral Right  08/18/2014   CLINICAL DATA:  Right lower extremity swelling. Post fall (07/2014). History of renal cell carcinoma. Former smoker. Evaluate for DVT.  EXAM: RIGHT LOWER EXTREMITY VENOUS DOPPLER ULTRASOUND  TECHNIQUE: Gray-scale sonography with graded compression, as well as color Doppler and duplex ultrasound were performed to evaluate the lower extremity deep venous systems from the level of the common femoral vein  and including the common femoral, femoral, profunda femoral, popliteal and calf veins including the posterior tibial, peroneal and gastrocnemius veins when visible. The superficial great saphenous vein was also interrogated. Spectral Doppler was utilized to evaluate flow at rest and with distal augmentation maneuvers in the common femoral, femoral and popliteal veins.  COMPARISON:  None.  FINDINGS: Contralateral Common Femoral Vein: Respiratory phasicity is normal and symmetric with the symptomatic side. No evidence of thrombus. Normal compressibility.  Common Femoral Vein: No evidence of thrombus. Normal compressibility, respiratory phasicity and response to augmentation.  Saphenofemoral Junction: No evidence of thrombus. Normal compressibility and flow on color Doppler imaging.  Profunda Femoral Vein: No evidence of thrombus. Normal compressibility and flow on color Doppler imaging.  Femoral Vein: No evidence of thrombus. Normal compressibility, respiratory phasicity and response to augmentation.  Popliteal Vein: No evidence of thrombus. Normal compressibility, respiratory phasicity and response to augmentation.  Calf Veins: There is there is hypoechoic, occlusive thrombus within 1 of the paired posterior tibial veins (representative images 29 through 36). The peroneal vein is suboptimally visualized.  Superficial Great Saphenous Vein: No evidence of thrombus. Normal compressibility and flow on color Doppler imaging.  Venous Reflux:  None.  Other Findings:  None.  IMPRESSION: Examination is positive for DVT within one of the paired posterior tibial veins. There is no extension of this distal DVT to involve the more proximal deep venous system of the right lower extremity.   Electronically Signed   By: Sandi Mariscal M.D.   On: 08/18/2014 16:10   Dg Chest Portable 1 View  08/17/2014   CLINICAL DATA:  Shortness of breath for 2 weeks.  EXAM: PORTABLE CHEST - 1 VIEW  COMPARISON:  06/12/2014  FINDINGS: There is patchy  bilateral lung opacity which is new from previous, most notable at bases and in the left perihilar lung. Possible small pleural effusions.  Stable cardiomegaly and aortic tortuosity. The patient is status post CABG. No pneumothorax.  IMPRESSION: Bilateral lung opacity which could reflect multi focal pneumonia or asymmetric edema.   Electronically Signed   By: Monte Fantasia M.D.   On: 08/17/2014 09:36   Dg Foot Complete Right  07/31/2014   CLINICAL DATA:  Pain and paresthesias involving the right foot and ankle associated with swelling which began earlier today. Dizziness and fall. Initial encounter.  EXAM: RIGHT FOOT COMPLETE - 3+ VIEW  COMPARISON:  None.  FINDINGS: Osseous demineralization. No acute fractures involving the bones of the foot. Mild narrowing of joint spaces in the midfoot. Mild narrowing of the 1st MTP joint space with associated hypertrophic spurring. Soft tissue swelling involving the dorsum of the foot. Small to moderate-sized plantar calcaneal spur.  IMPRESSION: No acute osseous abnormality. Osseous demineralization.  Mild osteoarthritis involving the midfoot and the 1st MTP joint.   Electronically Signed   By: Evangeline Dakin M.D.   On: 07/31/2014 17:01    Microbiology: No results found for this or any previous visit (from the past 240 hour(s)).   Labs: Basic Metabolic Panel:  Recent Labs Lab 08/17/14 0900 08/18/14 0400 08/19/14 0556 08/20/14 0730  NA 141 140 139 138  K 4.0 3.4* 3.6 3.5  CL 108 105 98* 95*  CO2 25 27 34* 35*  GLUCOSE 124* 106* 107* 111*  BUN 23* 24* 24* 28*  CREATININE 2.03* 1.95* 2.03* 1.83*  CALCIUM 9.2 8.8* 9.2 9.2   Liver Function Tests:  Recent Labs Lab 08/17/14 0900  AST 29  ALT 40  ALKPHOS 88  BILITOT 0.5  PROT 6.6  ALBUMIN 3.0*   No results for input(s): LIPASE, AMYLASE in the last 168 hours. No results for input(s): AMMONIA in the last 168 hours. CBC:  Recent Labs Lab 08/17/14 0900 08/18/14 0400 08/19/14 0556  08/20/14 0730  WBC 10.4 9.2 8.1 8.0  NEUTROABS 8.2*  --   --   --   HGB 10.6* 10.5* 10.3* 11.0*  HCT 33.8* 33.1* 33.5* 35.2*  MCV 91.1 90.9 90.3 90.3  PLT 408* 388 392 389   Cardiac Enzymes:  Recent Labs Lab 08/17/14 0900 08/17/14 1621 08/17/14 2210 08/18/14 0400 08/18/14 1042  TROPONINI 1.28* 1.04* 0.89* 0.91* 0.80*   BNP: BNP (last 3 results)  Recent Labs  08/17/14 0900  BNP 2588.0*    ProBNP (last 3 results) No results for input(s): PROBNP in the last 8760 hours.  CBG:  Recent Labs Lab 08/19/14 0755 08/19/14 1157 08/19/14 1648 08/19/14 2120 08/20/14 0806  GLUCAP 115* 127* 114* 112* 105*       Signed:  Joelle Flessner M  [facility] 08/20/2014, 10:46 AM

## 2014-08-20 NOTE — Progress Notes (Signed)
Subjective:  Awake and alert, appears comfortable  Objective:  Vital Signs in the last 24 hours: Temp:  [98.2 F (36.8 C)-98.7 F (37.1 C)] 98.2 F (36.8 C) (05/12 0614) Pulse Rate:  [57-59] 59 (05/12 0614) Resp:  [20] 20 (05/12 0614) BP: (89-107)/(46-62) 107/62 mmHg (05/12 0614) SpO2:  [94 %-95 %] 94 % (05/12 0614) Weight:  [209 lb 8 oz (95.029 kg)] 209 lb 8 oz (95.029 kg) (05/12 0614)  Intake/Output from previous day:  Intake/Output Summary (Last 24 hours) at 08/20/14 0901 Last data filed at 08/20/14 0615  Gross per 24 hour  Intake    240 ml  Output   2000 ml  Net  -1760 ml    Physical Exam: General appearance: alert, cooperative and no distress Neck: no carotid bruit and no JVD Lungs: clear to auscultation bilaterally Heart: regular rate and rhythm Extremities: no obvious edema, Rt calf a little warmer than Lt Skin: pale cool and dry Neurologic: Grossly normal   Rate: 60  Rhythm: normal sinus rhythm  Lab Results:  Recent Labs  08/19/14 0556 08/20/14 0730  WBC 8.1 8.0  HGB 10.3* 11.0*  PLT 392 389    Recent Labs  08/19/14 0556 08/20/14 0730  NA 139 138  K 3.6 3.5  CL 98* 95*  CO2 34* 35*  GLUCOSE 107* 111*  BUN 24* 28*  CREATININE 2.03* 1.83*    Recent Labs  08/18/14 0400 08/18/14 1042  TROPONINI 0.91* 0.80*   No results for input(s): INR in the last 72 hours.  Scheduled Meds: . antiseptic oral rinse  7 mL Mouth Rinse BID  . aspirin EC  325 mg Oral Daily  . donepezil  10 mg Oral QHS  . escitalopram  20 mg Oral Daily  . feeding supplement (ENSURE ENLIVE)  237 mL Oral TID BM  . fludrocortisone  0.1 mg Oral BID  . furosemide  40 mg Oral Daily  . insulin aspart  0-15 Units Subcutaneous TID WC  . insulin aspart  0-5 Units Subcutaneous QHS  . levothyroxine  125 mcg Oral QAC breakfast  . midodrine  10 mg Oral BID  . pantoprazole  40 mg Oral q morning - 10a  . polyethylene glycol  17 g Oral QHS  . Rivaroxaban  15 mg Oral BID WC  .  [START ON 09/09/2014] rivaroxaban  20 mg Oral Q supper  . sodium chloride  3 mL Intravenous Q12H  . sodium chloride  3 mL Intravenous Q12H  . ziprasidone  20 mg Oral BID WC   Continuous Infusions:  PRN Meds:.sodium chloride, acetaminophen **OR** acetaminophen, alum & mag hydroxide-simeth, bisacodyl, HYDROcodone-acetaminophen, ondansetron **OR** ondansetron (ZOFRAN) IV, polyethylene glycol, sodium chloride, traZODone   Imaging: Imaging results have been reviewed  Cardiac Studies: Echo 08/17/14 Study Conclusions  - Left ventricle: The cavity size was mildly dilated. Wall thickness was normal. Systolic function was mildly to moderately reduced. The estimated ejection fraction was in the range of 40% to 45%. Findings consistent with left ventricular diastolic dysfunction. Doppler parameters are consistent with high ventricular filling pressure. - Regional wall motion abnormality: Mild hypokinesis of the basal-mid inferior and basal-mid inferolateral myocardium. - Ventricular septum: Septal motion showed abnormal function and dyssynergy. These changes are consistent with intraventricular conduction delay. - Aortic valve: Moderately calcified annulus. - Mitral valve: There was moderate eccentric regurgitation. - Right ventricle: The cavity size was mildly dilated. Systolic function was moderately to severely reduced. - Tricuspid valve: There was mild-moderate regurgitation. - Pulmonic valve:  Moderately elevated pulmonary pressures (48 mmHg). - Systemic veins: Dilated IVC, with normal respiratory variation. Estimated CVP 8 mmHg.  Assessment/Plan:  81 yr old male with a past medical history which includes CAD with inferior STEMI for which he underwent PCI of the RCA in 12/2013, followed by CABG afterwards (LIMA to LAD, vein graft to an obtuse marginal branch, diagonal branch and PDA. 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 He was admitted 08/17/14 with acute on chronic CHF. His troponin was elevated and he had RVD on echo. Venous dopplers confirmed DVT and we presume he has had a PE.   Principal Problem:   Acute on chronic diastolic heart failure Active Problems:   Elevated troponin   Lower leg DVT (deep venous thrombosis)   Pulmonary embolism suspected   Orthostatic hypotension   S/P CABG x 4   DM type 2 (diabetes mellitus, type 2)   Acute renal failure superimposed on stage 3 chronic kidney disease   Congestive heart disease   Hyperlipidemia   Obstructive sleep apnea   Dementia without behavioral disturbance   S/p nephrectomy   Protein-calorie malnutrition, severe   PLAN: Risks and benefits of anticoagulation considered. Xarelto added. He has diuresed 6 lbs (6L). He is on Lasix 40 mg daily.   Kerin Ransom PA-C 08/20/2014, 9:01 AM [phone]  The patient was seen and examined, and I agree with the assessment and plan as documented above, with modifications as noted below. Pt doing much better. Denies chest pain and shortness of breath. Right leg swelling gradually improving. Given Xarelto institution, will reduce ASA to 81 mg. Currently on Lasix 40 mg. Recommend continued monitoring of renal function over next several days, as this may need to be decreased to 20 mg if need be, or alternating doses of 40 and 20 mg. He is stable. I will arrange for follow up in our office within the next 1-2 weeks. No further recommendations. Will sign off.

## 2014-08-20 NOTE — Care Management Note (Signed)
Case Management Note  Patient Details  Name: [name] [name] MRN: [id number] Date of Birth: [birth year]  Subjective/Objective:                    Action/Plan:   Expected Discharge Date:  08/19/14               Expected Discharge Plan:  Home/Self Care (Pt has 24 hour caregivers in the home)  In-House Referral:  NA  Discharge planning Services  CM Consult  Post Acute Care Choice:  Resumption of Svcs/PTA Provider Choice offered to:  Patient  DME Arranged:    DME Agency:     HH Arranged:  RN, PT, Nurse's Aide [place] Agency:  [facility]  Status of Service:  Completed, signed off  Medicare Important Message Given:  Yes Date Medicare IM Given:  08/20/14 Medicare IM give by:  Christinia Gully, RN BSN CM Date Additional Medicare IM Given:    Additional Medicare Important Message give by:     If discussed at [place] of Stay Meetings, dates discussed:    Additional Comments: Pt discharged home today with resumption of [facility] RN, PT, and aide (per pt choice). Romualdo Bolk of [facility] is aware and will collect the pts information from the chart. [facility] services to resume with 48 hours of discharge. No DME needs noted. Pt and pts nurse aware of discharge arrangements. Christinia Gully [place], RN 08/20/2014, 11:35 AM

## 2014-08-20 NOTE — Progress Notes (Signed)
Discharge instructions reviewed with pt. IV and tele removed. No current questions or concerns. Will be continuing home health

## 2014-08-20 NOTE — Progress Notes (Signed)
Patient had nose bleed, pressure held and gauze applied x 15 minutes, Dr. Wyline Copas notified, orders received for Afrin Nasal Spray, order carried out, nose bleed stopped.

## 2014-08-21 DIAGNOSIS — E119 Type 2 diabetes mellitus without complications: Secondary | ICD-10-CM | POA: Diagnosis not present

## 2014-08-21 DIAGNOSIS — M6281 Muscle weakness (generalized): Secondary | ICD-10-CM | POA: Diagnosis not present

## 2014-08-21 DIAGNOSIS — Z483 Aftercare following surgery for neoplasm: Secondary | ICD-10-CM | POA: Diagnosis not present

## 2014-08-21 DIAGNOSIS — N179 Acute kidney failure, unspecified: Secondary | ICD-10-CM | POA: Diagnosis not present

## 2014-08-21 DIAGNOSIS — N183 Chronic kidney disease, stage 3 (moderate): Secondary | ICD-10-CM | POA: Diagnosis not present

## 2014-08-21 DIAGNOSIS — I5033 Acute on chronic diastolic (congestive) heart failure: Secondary | ICD-10-CM | POA: Diagnosis not present

## 2014-08-21 DIAGNOSIS — F0390 Unspecified dementia without behavioral disturbance: Secondary | ICD-10-CM | POA: Diagnosis not present

## 2014-08-21 DIAGNOSIS — E039 Hypothyroidism, unspecified: Secondary | ICD-10-CM | POA: Diagnosis not present

## 2014-08-21 DIAGNOSIS — Z48815 Encounter for surgical aftercare following surgery on the digestive system: Secondary | ICD-10-CM | POA: Diagnosis not present

## 2014-08-21 DIAGNOSIS — Z951 Presence of aortocoronary bypass graft: Secondary | ICD-10-CM | POA: Diagnosis not present

## 2014-08-21 DIAGNOSIS — G4733 Obstructive sleep apnea (adult) (pediatric): Secondary | ICD-10-CM | POA: Diagnosis not present

## 2014-08-21 DIAGNOSIS — I2510 Atherosclerotic heart disease of native coronary artery without angina pectoris: Secondary | ICD-10-CM | POA: Diagnosis not present

## 2014-08-21 DIAGNOSIS — I252 Old myocardial infarction: Secondary | ICD-10-CM | POA: Diagnosis not present

## 2014-08-21 DIAGNOSIS — R269 Unspecified abnormalities of gait and mobility: Secondary | ICD-10-CM | POA: Diagnosis not present

## 2014-08-24 DIAGNOSIS — I5031 Acute diastolic (congestive) heart failure: Secondary | ICD-10-CM | POA: Diagnosis not present

## 2014-08-24 DIAGNOSIS — I82401 Acute embolism and thrombosis of unspecified deep veins of right lower extremity: Secondary | ICD-10-CM | POA: Diagnosis not present

## 2014-08-24 DIAGNOSIS — E6609 Other obesity due to excess calories: Secondary | ICD-10-CM | POA: Diagnosis not present

## 2014-08-24 DIAGNOSIS — Z6832 Body mass index (BMI) 32.0-32.9, adult: Secondary | ICD-10-CM | POA: Diagnosis not present

## 2014-08-25 DIAGNOSIS — N183 Chronic kidney disease, stage 3 (moderate): Secondary | ICD-10-CM | POA: Diagnosis not present

## 2014-08-25 DIAGNOSIS — I2510 Atherosclerotic heart disease of native coronary artery without angina pectoris: Secondary | ICD-10-CM | POA: Diagnosis not present

## 2014-08-25 DIAGNOSIS — I5033 Acute on chronic diastolic (congestive) heart failure: Secondary | ICD-10-CM | POA: Diagnosis not present

## 2014-08-25 DIAGNOSIS — R269 Unspecified abnormalities of gait and mobility: Secondary | ICD-10-CM | POA: Diagnosis not present

## 2014-08-25 DIAGNOSIS — E039 Hypothyroidism, unspecified: Secondary | ICD-10-CM | POA: Diagnosis not present

## 2014-08-25 DIAGNOSIS — F0390 Unspecified dementia without behavioral disturbance: Secondary | ICD-10-CM | POA: Diagnosis not present

## 2014-08-25 DIAGNOSIS — Z483 Aftercare following surgery for neoplasm: Secondary | ICD-10-CM | POA: Diagnosis not present

## 2014-08-25 DIAGNOSIS — E119 Type 2 diabetes mellitus without complications: Secondary | ICD-10-CM | POA: Diagnosis not present

## 2014-08-25 DIAGNOSIS — I252 Old myocardial infarction: Secondary | ICD-10-CM | POA: Diagnosis not present

## 2014-08-25 DIAGNOSIS — Z48815 Encounter for surgical aftercare following surgery on the digestive system: Secondary | ICD-10-CM | POA: Diagnosis not present

## 2014-08-25 DIAGNOSIS — G4733 Obstructive sleep apnea (adult) (pediatric): Secondary | ICD-10-CM | POA: Diagnosis not present

## 2014-08-25 DIAGNOSIS — N179 Acute kidney failure, unspecified: Secondary | ICD-10-CM | POA: Diagnosis not present

## 2014-08-25 DIAGNOSIS — M6281 Muscle weakness (generalized): Secondary | ICD-10-CM | POA: Diagnosis not present

## 2014-08-26 ENCOUNTER — Ambulatory Visit (INDEPENDENT_AMBULATORY_CARE_PROVIDER_SITE_OTHER): Payer: Medicare Other | Admitting: Physician Assistant

## 2014-08-26 ENCOUNTER — Encounter: Payer: Self-pay | Admitting: Physician Assistant

## 2014-08-26 VITALS — BP 98/54 | HR 71 | Ht 70.0 in | Wt 223.4 lb

## 2014-08-26 DIAGNOSIS — I5043 Acute on chronic combined systolic (congestive) and diastolic (congestive) heart failure: Secondary | ICD-10-CM | POA: Diagnosis not present

## 2014-08-26 DIAGNOSIS — F0390 Unspecified dementia without behavioral disturbance: Secondary | ICD-10-CM

## 2014-08-26 DIAGNOSIS — I951 Orthostatic hypotension: Secondary | ICD-10-CM

## 2014-08-26 DIAGNOSIS — I9589 Other hypotension: Secondary | ICD-10-CM | POA: Diagnosis not present

## 2014-08-26 NOTE — Assessment & Plan Note (Signed)
Patient's creatinine was 2.02 at Dr. Delanna Ahmadi office on Monday. Will repeat next week.

## 2014-08-26 NOTE — Assessment & Plan Note (Signed)
Currently has 24-hour care

## 2014-08-26 NOTE — Assessment & Plan Note (Signed)
Patient has approximately 10-14 pound weight gain and increased dyspnea. It's difficult to assess his weight with his boot on. Will increase Lasix to 80 mg once daily for 3 days. If he is not feeling better by Friday he may need to go to the emergency room. It's a very fine balance between his kidney failure, chronic hypotension, and heart failure. I will see him back next week and follow-up with Dr.Koneswaran in 3 weeks. Check BMET next week.

## 2014-08-26 NOTE — Assessment & Plan Note (Signed)
Stable without chest pain 

## 2014-08-26 NOTE — Assessment & Plan Note (Signed)
Recently diagnosed with right lower extremity DVT. He is high risk for fall but also high risk for pulmonary embolus so he was placed on low dose Xarelto 15 mg twice a day and then on day 22 switch to 20 mg once daily. O2 sats 96%.

## 2014-08-26 NOTE — Assessment & Plan Note (Signed)
Chronic problem for this patient. Taking Florinef and Midodrin

## 2014-08-26 NOTE — Progress Notes (Signed)
Cardiology Office Note   Date:  08/26/2014   ID:  BRENNAN LITZINGER, DOB Jul 03, 1933, MRN [id number]  PCP:  Purvis Kilts, MD  Cardiologist:  Was Quay Burow, MD, now wants to be followed in [place] so will see Dr. Bronson Ing.  Chief Complaint: Swelling and shortness of breath    History of Present Illness: [name] [name] is a 81 y.o. male who presents for post hospital follow-up. He is a very complicated patient with CAD status post STEMI treated with PCI of the RCA in 12/2013 followed by CABG with a LIMA to the LAD, SVG to the OM, SVG to the diagonal branch and PDA. EF 45-50% by LV gram in 12/2013. He also has orthostatic hypotension and is on Florinef and Midrin. He has hyperlipidemia intolerant to statins, obstructive sleep apnea and C Pap, dementia and CKD stage III.   He was admitted to the hospital on 08/17/14 with acute on chronic CHF. His troponins were elevated and he has RV dysfunction on 2-D echo EF  40-45%. He was treated medically because of CKD with one kidney and high risk of contrast mediated nephropathy. He also had significant right lower extremity edema and venous Dopplers confirmed DVT and he was presumed to have a PE. He is at high risk of falls because of his orthostatic hypotension but also at risk for pulmonary embolus. He does have caregivers with him at home. It was recommended that he be treated with Xarelto for 3-6 months. His aspirin was decreased to 81 mg daily.  Patient comes in today accompanied by his daughter-in-law [name]. He complains of for a 5 day history of worsening edema in his abdomen, feeling full and shortness of breath. His weight when he was in the hospital was 209 and he now weighs 223 pounds with a boot on. He had broken his foot and cannot bear weight without the boot. His daughter-in-law says it weighs about 2 pounds. He weighed 219 pounds on Monday and Dr. Cornelia Copa office with the boot on. His creatinine was also 2.02 on Monday. It was 1.83 at  discharge on 2.03 on 08/19/14. His O2 sats are 96%. He had no acute onset of dyspnea, it has been gradual.    Past Medical History  Diagnosis Date  . Chronic constipation   . GERD (gastroesophageal reflux disease)   . Arthritis   . Hyperlipidemia   . Orthostatic hypotension 11/19/2013  . Coronary artery disease     CARDIOLOGIST-  DR BERRY  --  HX  STEMI  12-17-2013  S/P  CABG X4  12-18-2013  . Mild dementia   . Type 2 diabetes mellitus   . Hypothyroidism   . History of esophageal dilatation   . History of gastritis   . History of hiatal hernia   . History of bladder cancer     hx  TCC low grade  . Prostate cancer     low-grade  . Kidney tumor     right  . BPH (benign prostatic hyperplasia)   . CKD (chronic kidney disease) stage 3, GFR 30-59 ml/min   . History of kidney stones   . ED (erectile dysfunction)   . S/P CABG x 4     12-18-2013  . Bladder tumor   . Complication of anesthesia     HARD TO WAKE  . OSA on CPAP     mild osa   per sleep study 07-11-2009- patient states does not use regularly    Past Surgical History  Procedure Laterality Date  . Upper gastrointestinal endoscopy  04/06/2010  &  01-08-2013    w/ esophageal dilatation  . Hemorrhoid surgery    . Hand surgery      right  . Wrist fracture surgery      pins placed-left  . Cystoscopy with biopsy  01/09/2012    Procedure: CYSTOSCOPY WITH BIOPSY;  Surgeon: Marissa Nestle, MD;  Location: [facility]S;  Service: Urology;  Laterality: N/A;  Cystoscopy with Multiple Bladder Biopsies  . Esophagogastroduodenoscopy (egd) with esophageal dilation N/A 01/08/2013    Procedure: ESOPHAGOGASTRODUODENOSCOPY (EGD) WITH ESOPHAGEAL DILATION;  Surgeon: Rogene Houston, MD;  Location: [facility] SU[facility];  Service: Endoscopy;  Laterality: N/A;  120  . Left heart cath N/A 12/17/2013    Procedure: LEFT HEART CATH;  Surgeon: Leonie Man, MD;  Location: [facility] CATH LAB;  Service: Cardiovascular;  Laterality: N/A;  . Left heart  catheterization with coronary angiogram N/A 12/17/2013    Procedure: LEFT HEART CATHETERIZATION WITH CORONARY ANGIOGRAM;  Surgeon: Leonie Man, MD;  Location: [facility] CATH LAB;  Service: Cardiovascular;  Laterality: N/A;  . Colonoscopy with esophagogastroduodenoscopy (egd)  08-15-2006    w/ esophageal dilatation  . Umbilical hernia repair  04-24-2003  . Transurethral resection of bladder tumor  06-14-2004  . Transurethral resection of prostate  10-04-2004  . Cysto/  removal bladder calculus/  resection bladder tumor  10-16-2006  . Cystostomy w/ bladder biopsy  01-09-2012  . Cardiac catheterization  12-17-2013   dr Shanon Brow harding    severe disease RCA with thrombotic appearing subtotal occlusions/  95-99%  OM2/  diffuse disease LAD  and D1/  mild to moderate basal to mid inferior hypokinesis/  severe elevated LVEDP/  ef 45-50%  . Coronary artery bypass graft N/A 12/18/2013    Procedure: Coronary artery bypass grafting times four using left internal mammary artery and endoscopically harvested right saphenous vein graft;  Surgeon: Gaye Pollack, MD;  Location: [facility] OR;  Service: Open Heart Surgery;  Laterality: N/A;  . Transthoracic echocardiogram  11-14-2012    grade I diastolic dysfunction/  ef 55-60%/  trivial MR  and TR  . Cardiovascular stress test  08-09-2009  dr berry    mild to moderate perfusion defect in basal , mid, and apical inferior regions consistent with an infart/scar/   No evidence ischemia/  ef 51%/  no ECG changes/   low risk scan  . Transurethral resection of bladder tumor N/A 04/07/2014    Procedure: TRANSURETHRAL RESECTION OF BLADDER TUMOR (TURBT);  Surgeon: Arvil Persons, MD;  Location: [facility];  Service: Urology;  Laterality: N/A;  . Cystoscopy/retrograde/ureteroscopy Right 04/07/2014    Procedure: CYSTOSCOPY/RETROGRADE/URETEROSCOPY WITH BIOPSY OF RENAL PELVIS TUMOR;  Surgeon: Arvil Persons, MD;  Location: [facility];  Service: Urology;  Laterality:  Right;  . Robot assisted laparoscopic nephrectomy Right 06/05/2014    Procedure: ROBOTIC ASSISTED LAPAROSCOPIC RIGHT NEPHROURETERECTOMY, VENTRAL HERNIA REPAIR ;  Surgeon: Ardis Hughs, MD;  Location: [facility] ORS;  Service: Urology;  Laterality: Right;  . Cystoscopy w/ ureteral stent placement Right 06/05/2014    Procedure: CYSTOSCOPY ;  Surgeon: Ardis Hughs, MD;  Location: [facility] ORS;  Service: Urology;  Laterality: Right;  . Radiology with anesthesia N/A 06/22/2014    Procedure: MRI BRAIN W/O CONTRAST;  Surgeon: Medication Radiologist, MD;  Location: [facility];  Service: Radiology;  Laterality: N/A;     Current Outpatient Prescriptions  Medication Sig Dispense Refill  . aspirin  81 MG chewable tablet Chew 1 tablet (81 mg total) by mouth daily.    [name] donepezil (ARICEPT) 10 MG tablet Take 10 mg by mouth at bedtime.    [name] escitalopram (LEXAPRO) 20 MG tablet Take 20 mg by mouth daily.    . feeding supplement, ENSURE ENLIVE, (ENSURE ENLIVE) LIQD Take 237 mLs by mouth 3 (three) times daily between meals. 237 mL 12  . fludrocortisone (FLORINEF) 0.1 MG tablet Take 0.1 mg by mouth 2 (two) times daily.    . furosemide (LASIX) 40 MG tablet Take 1 tablet (40 mg total) by mouth daily. 30 tablet 0  . levothyroxine (SYNTHROID, LEVOTHROID) 125 MCG tablet Take 125 mcg by mouth daily before breakfast.    . midodrine (PROAMATINE) 10 MG tablet Take 10 mg by mouth 2 (two) times daily.    . pantoprazole (PROTONIX) 40 MG tablet TAKE ONE TABLET EVERY MORNING 30 tablet 4  . polyethylene glycol (MIRALAX / GLYCOLAX) packet Take 17 g by mouth at bedtime.    . Rivaroxaban (XARELTO STARTER PACK) 15 & 20 MG TBPK Take as directed on package: Start with one 15mg  tablet by mouth twice a day with food. On Day 22, switch to one 20mg  tablet once a day with food. 51 each 0  . ziprasidone (GEODON) 20 MG capsule Take 1 capsule (20 mg total) by mouth 2 (two) times daily with a meal. 60 capsule 0   No current facility-administered  medications for this visit.    Allergies:   Lorazepam and Statins    Social History:  The patient  reports that he has quit smoking. His smoking use included Cigarettes. He has a 4 pack-year smoking history. He quit smokeless tobacco use about 54 years ago. His smokeless tobacco use included Chew. He reports that he does not drink alcohol or use illicit drugs.   Family History:  The patient's    family history includes Cancer - Lung in his brother.    ROS:  Please see the history of present illness.   Otherwise, review of systems are positive for none.   All other systems are reviewed and negative.    PHYSICAL EXAM: VS:  BP 98/54 mmHg  Pulse 71  Ht 5\' 10"  (1.778 m)  Wt 223 lb 6.4 oz (101.334 kg)  BMI 32.05 kg/m2  SpO2 96% , BMI Body mass index is 32.05 kg/(m^2). GEN: Well nourished, well developed, in no acute distress Neck: no JVD, HJR, carotid bruits, or masses Cardiac: RRR; 2/6 systolic murmur at the left sternal border and apex, no gallop, rubs, thrill or heave,  Respiratory:  Decreased breath sounds throughout without rales clear to auscultation bilaterally, normal work of breathing GI: Abdomen distended soft, nontender, nondistended, + BS MS: no deformity or atrophy Extremities: Only slight edema without cyanosis, clubbing, decreased distal pulses bilaterally.  Skin: warm and dry, no rash Neuro:  Strength and sensation are intact    EKG:  EKG is ordered today. The ekg ordered today demonstrates normal sinus rhythm inferior Q waves nonspecific ST-T wave changes, no acute change   Recent Labs: 12/19/2013: Magnesium 1.9 08/17/2014: ALT 40; B Natriuretic Peptide 2588.0*; TSH 5.346* 08/20/2014: BUN 28*; Creatinine 1.83*; Hemoglobin 11.0*; Platelets 389; Potassium 3.5; Sodium 138    Lipid Panel No results found for: CHOL, TRIG, HDL, CHOLHDL, VLDL, LDLCALC, LDLDIRECT    Wt Readings from Last 3 Encounters:  08/26/14 223 lb 6.4 oz (101.334 kg)  08/20/14 209 lb 8 oz (95.029  kg)  07/31/14 215 lb (  97.523 kg)      Other studies Reviewed: Additional studies/ records that were reviewed today include and review of the records demonstrates: Cardiac Studies: Echo 08/17/14 Study Conclusions  - Left ventricle: The cavity size was mildly dilated. Wall   thickness was normal. Systolic function was mildly to moderately   reduced. The estimated ejection fraction was in the range of 40%   to 45%. Findings consistent with left ventricular diastolic   dysfunction. Doppler parameters are consistent with high   ventricular filling pressure. - Regional wall motion abnormality: Mild hypokinesis of the   basal-mid inferior and basal-mid inferolateral myocardium. - Ventricular septum: Septal motion showed abnormal function and   dyssynergy. These changes are consistent with intraventricular   conduction delay. - Aortic valve: Moderately calcified annulus. - Mitral valve: There was moderate eccentric regurgitation. - Right ventricle: The cavity size was mildly dilated. Systolic   function was moderately to severely reduced. - Tricuspid valve: There was mild-moderate regurgitation. - Pulmonic valve: Moderately elevated pulmonary pressures (48   mmHg). - Systemic veins: Dilated IVC, with normal respiratory variation.   Estimated CVP 8 mmHg.     ASSESSMENT AND PLAN: Acute on chronic systolic and diastolic heart failure, NYHA class 3 Patient has approximately 10-14 pound weight gain and increased dyspnea. It's difficult to assess his weight with his boot on. Will increase Lasix to 80 mg once daily for 3 days. If he is not feeling better by Friday he may need to go to the emergency room. It's a very fine balance between his kidney failure, chronic hypotension, and heart failure. I will see him back next week and follow-up with Dr.Koneswaran in 3 weeks. Check BMET next week.    Orthostatic hypotension Chronic problem for this patient. Taking Florinef and Midodrin   Dementia  without behavioral disturbance Currently has 24-hour care   Acute renal failure superimposed on stage 3 chronic kidney disease Patient's creatinine was 2.02 at Dr. Delanna Ahmadi office on Monday. Will repeat next week.   S/P CABG x 4 Stable without chest pain   Lower leg DVT (deep venous thrombosis) Recently diagnosed with right lower extremity DVT. He is high risk for fall but also high risk for pulmonary embolus so he was placed on low dose Xarelto 15 mg twice a day and then on day 22 switch to 20 mg once daily. O2 sats 96%.      Sumner Boast, PA-C  08/26/2014 12:41 PM    [facility] [address], [place], [place]  [address] Phone: [phone]; Fax: [phone]

## 2014-08-26 NOTE — Patient Instructions (Signed)
Your physician recommends that you schedule a follow-up appointment on Wed with Ermalinda Barrios, PA-C  Your physician recommends that you schedule a follow-up appointment with Dr. Bronson Ing in 4 weeks.  Your physician has recommended you make the following change in your medication:   Please Take Lasix 80 mg on Today, Tomorrow and Friday.  Return to taking 40 mg of Lasix Daily on Sat.   Thank you for choosing [facility]!

## 2014-08-27 DIAGNOSIS — E039 Hypothyroidism, unspecified: Secondary | ICD-10-CM | POA: Diagnosis not present

## 2014-08-27 DIAGNOSIS — N179 Acute kidney failure, unspecified: Secondary | ICD-10-CM | POA: Diagnosis not present

## 2014-08-27 DIAGNOSIS — E119 Type 2 diabetes mellitus without complications: Secondary | ICD-10-CM | POA: Diagnosis not present

## 2014-08-27 DIAGNOSIS — Z48815 Encounter for surgical aftercare following surgery on the digestive system: Secondary | ICD-10-CM | POA: Diagnosis not present

## 2014-08-27 DIAGNOSIS — M6281 Muscle weakness (generalized): Secondary | ICD-10-CM | POA: Diagnosis not present

## 2014-08-27 DIAGNOSIS — N183 Chronic kidney disease, stage 3 (moderate): Secondary | ICD-10-CM | POA: Diagnosis not present

## 2014-08-27 DIAGNOSIS — G4733 Obstructive sleep apnea (adult) (pediatric): Secondary | ICD-10-CM | POA: Diagnosis not present

## 2014-08-27 DIAGNOSIS — I5033 Acute on chronic diastolic (congestive) heart failure: Secondary | ICD-10-CM | POA: Diagnosis not present

## 2014-08-27 DIAGNOSIS — I2510 Atherosclerotic heart disease of native coronary artery without angina pectoris: Secondary | ICD-10-CM | POA: Diagnosis not present

## 2014-08-27 DIAGNOSIS — F0390 Unspecified dementia without behavioral disturbance: Secondary | ICD-10-CM | POA: Diagnosis not present

## 2014-08-27 DIAGNOSIS — R269 Unspecified abnormalities of gait and mobility: Secondary | ICD-10-CM | POA: Diagnosis not present

## 2014-08-27 DIAGNOSIS — I252 Old myocardial infarction: Secondary | ICD-10-CM | POA: Diagnosis not present

## 2014-08-27 DIAGNOSIS — Z483 Aftercare following surgery for neoplasm: Secondary | ICD-10-CM | POA: Diagnosis not present

## 2014-08-28 DIAGNOSIS — Z48815 Encounter for surgical aftercare following surgery on the digestive system: Secondary | ICD-10-CM | POA: Diagnosis not present

## 2014-08-28 DIAGNOSIS — R269 Unspecified abnormalities of gait and mobility: Secondary | ICD-10-CM | POA: Diagnosis not present

## 2014-08-28 DIAGNOSIS — G4733 Obstructive sleep apnea (adult) (pediatric): Secondary | ICD-10-CM | POA: Diagnosis not present

## 2014-08-28 DIAGNOSIS — N183 Chronic kidney disease, stage 3 (moderate): Secondary | ICD-10-CM | POA: Diagnosis not present

## 2014-08-28 DIAGNOSIS — I2510 Atherosclerotic heart disease of native coronary artery without angina pectoris: Secondary | ICD-10-CM | POA: Diagnosis not present

## 2014-08-28 DIAGNOSIS — F0390 Unspecified dementia without behavioral disturbance: Secondary | ICD-10-CM | POA: Diagnosis not present

## 2014-08-28 DIAGNOSIS — Z483 Aftercare following surgery for neoplasm: Secondary | ICD-10-CM | POA: Diagnosis not present

## 2014-08-28 DIAGNOSIS — M6281 Muscle weakness (generalized): Secondary | ICD-10-CM | POA: Diagnosis not present

## 2014-08-28 DIAGNOSIS — E039 Hypothyroidism, unspecified: Secondary | ICD-10-CM | POA: Diagnosis not present

## 2014-08-28 DIAGNOSIS — E119 Type 2 diabetes mellitus without complications: Secondary | ICD-10-CM | POA: Diagnosis not present

## 2014-08-28 DIAGNOSIS — I252 Old myocardial infarction: Secondary | ICD-10-CM | POA: Diagnosis not present

## 2014-08-28 DIAGNOSIS — I5033 Acute on chronic diastolic (congestive) heart failure: Secondary | ICD-10-CM | POA: Diagnosis not present

## 2014-08-28 DIAGNOSIS — N179 Acute kidney failure, unspecified: Secondary | ICD-10-CM | POA: Diagnosis not present

## 2014-08-30 DIAGNOSIS — M6281 Muscle weakness (generalized): Secondary | ICD-10-CM | POA: Diagnosis not present

## 2014-08-30 DIAGNOSIS — I259 Chronic ischemic heart disease, unspecified: Secondary | ICD-10-CM | POA: Diagnosis not present

## 2014-09-01 DIAGNOSIS — F0390 Unspecified dementia without behavioral disturbance: Secondary | ICD-10-CM | POA: Diagnosis not present

## 2014-09-01 DIAGNOSIS — R269 Unspecified abnormalities of gait and mobility: Secondary | ICD-10-CM | POA: Diagnosis not present

## 2014-09-01 DIAGNOSIS — Z48815 Encounter for surgical aftercare following surgery on the digestive system: Secondary | ICD-10-CM | POA: Diagnosis not present

## 2014-09-01 DIAGNOSIS — E039 Hypothyroidism, unspecified: Secondary | ICD-10-CM | POA: Diagnosis not present

## 2014-09-01 DIAGNOSIS — E119 Type 2 diabetes mellitus without complications: Secondary | ICD-10-CM | POA: Diagnosis not present

## 2014-09-01 DIAGNOSIS — C672 Malignant neoplasm of lateral wall of bladder: Secondary | ICD-10-CM | POA: Diagnosis not present

## 2014-09-01 DIAGNOSIS — I5033 Acute on chronic diastolic (congestive) heart failure: Secondary | ICD-10-CM | POA: Diagnosis not present

## 2014-09-01 DIAGNOSIS — I252 Old myocardial infarction: Secondary | ICD-10-CM | POA: Diagnosis not present

## 2014-09-01 DIAGNOSIS — R310 Gross hematuria: Secondary | ICD-10-CM | POA: Diagnosis not present

## 2014-09-01 DIAGNOSIS — M6281 Muscle weakness (generalized): Secondary | ICD-10-CM | POA: Diagnosis not present

## 2014-09-01 DIAGNOSIS — Z483 Aftercare following surgery for neoplasm: Secondary | ICD-10-CM | POA: Diagnosis not present

## 2014-09-01 DIAGNOSIS — N179 Acute kidney failure, unspecified: Secondary | ICD-10-CM | POA: Diagnosis not present

## 2014-09-01 DIAGNOSIS — G4733 Obstructive sleep apnea (adult) (pediatric): Secondary | ICD-10-CM | POA: Diagnosis not present

## 2014-09-01 DIAGNOSIS — N183 Chronic kidney disease, stage 3 (moderate): Secondary | ICD-10-CM | POA: Diagnosis not present

## 2014-09-01 DIAGNOSIS — I2510 Atherosclerotic heart disease of native coronary artery without angina pectoris: Secondary | ICD-10-CM | POA: Diagnosis not present

## 2014-09-02 ENCOUNTER — Ambulatory Visit (INDEPENDENT_AMBULATORY_CARE_PROVIDER_SITE_OTHER): Payer: Medicare Other | Admitting: Physician Assistant

## 2014-09-02 ENCOUNTER — Encounter: Payer: Self-pay | Admitting: Physician Assistant

## 2014-09-02 VITALS — BP 118/68 | HR 79 | Ht 70.5 in | Wt 221.0 lb

## 2014-09-02 DIAGNOSIS — N179 Acute kidney failure, unspecified: Secondary | ICD-10-CM | POA: Diagnosis not present

## 2014-09-02 DIAGNOSIS — M6281 Muscle weakness (generalized): Secondary | ICD-10-CM | POA: Diagnosis not present

## 2014-09-02 DIAGNOSIS — E119 Type 2 diabetes mellitus without complications: Secondary | ICD-10-CM | POA: Diagnosis not present

## 2014-09-02 DIAGNOSIS — Z951 Presence of aortocoronary bypass graft: Secondary | ICD-10-CM

## 2014-09-02 DIAGNOSIS — R269 Unspecified abnormalities of gait and mobility: Secondary | ICD-10-CM | POA: Diagnosis not present

## 2014-09-02 DIAGNOSIS — I5043 Acute on chronic combined systolic (congestive) and diastolic (congestive) heart failure: Secondary | ICD-10-CM

## 2014-09-02 DIAGNOSIS — N183 Chronic kidney disease, stage 3 unspecified: Secondary | ICD-10-CM

## 2014-09-02 DIAGNOSIS — G4733 Obstructive sleep apnea (adult) (pediatric): Secondary | ICD-10-CM | POA: Diagnosis not present

## 2014-09-02 DIAGNOSIS — I824Z1 Acute embolism and thrombosis of unspecified deep veins of right distal lower extremity: Secondary | ICD-10-CM | POA: Diagnosis not present

## 2014-09-02 DIAGNOSIS — I951 Orthostatic hypotension: Secondary | ICD-10-CM | POA: Diagnosis not present

## 2014-09-02 DIAGNOSIS — I252 Old myocardial infarction: Secondary | ICD-10-CM | POA: Diagnosis not present

## 2014-09-02 DIAGNOSIS — F0390 Unspecified dementia without behavioral disturbance: Secondary | ICD-10-CM | POA: Diagnosis not present

## 2014-09-02 DIAGNOSIS — R0602 Shortness of breath: Secondary | ICD-10-CM | POA: Diagnosis not present

## 2014-09-02 DIAGNOSIS — Z483 Aftercare following surgery for neoplasm: Secondary | ICD-10-CM | POA: Diagnosis not present

## 2014-09-02 DIAGNOSIS — Z48815 Encounter for surgical aftercare following surgery on the digestive system: Secondary | ICD-10-CM | POA: Diagnosis not present

## 2014-09-02 DIAGNOSIS — I5033 Acute on chronic diastolic (congestive) heart failure: Secondary | ICD-10-CM | POA: Diagnosis not present

## 2014-09-02 DIAGNOSIS — R319 Hematuria, unspecified: Secondary | ICD-10-CM

## 2014-09-02 DIAGNOSIS — I2510 Atherosclerotic heart disease of native coronary artery without angina pectoris: Secondary | ICD-10-CM | POA: Diagnosis not present

## 2014-09-02 DIAGNOSIS — I9589 Other hypotension: Secondary | ICD-10-CM

## 2014-09-02 DIAGNOSIS — S82301D Unspecified fracture of lower end of right tibia, subsequent encounter for closed fracture with routine healing: Secondary | ICD-10-CM | POA: Diagnosis not present

## 2014-09-02 DIAGNOSIS — E039 Hypothyroidism, unspecified: Secondary | ICD-10-CM | POA: Diagnosis not present

## 2014-09-02 MED ORDER — FUROSEMIDE 80 MG PO TABS: 80.0000 mg | ORAL_TABLET | Freq: Every day | ORAL | Status: DC

## 2014-09-02 NOTE — Progress Notes (Addendum)
Cardiology Office Note   Date:  09/02/2014   ID:  [name] [name], DOB February 24, 1934, MRN [id number]  PCP:  Purvis Kilts, MD  Cardiologist:  Dr. Milagros Evener Dr. Gwenlyn Found)  Chief Complaint: leg swelling    History of Present Illness: [name] [name] is a 81 y.o. male who presents for  Follow-up of heart failure.He is a very complicated patient with CAD status post STEMI treated with PCI of the RCA in 12/2013 followed by CABG with a LIMA to the LAD, SVG to the OM, SVG to the diagonal branch and PDA. EF 45-50% by LV gram in 12/2013. He also has orthostatic hypotension and is on Florinef and Midrin. He has hyperlipidemia intolerant to statins, obstructive sleep apnea and CPap, dementia and CKD stage III.  He was admitted to the hospital on 08/17/14 with acute on chronic CHF. His troponins were elevated and he has RV dysfunction on 2-D echo EF 40-45%. He was treated medically because of CKD with one kidney and high risk of contrast mediated nephropathy. He also had significant right lower extremity edema and venous Dopplers confirmed DVT and he was presumed to have a PE. He is at high risk of falls because of his orthostatic hypotension but also at risk for pulmonary embolus. He does have caregivers with him at home. It was recommended that he be treated with Xarelto for 3-6 months. His aspirin was decreased to 81 mg daily.   I saw the patient last week and he had approximate 10-14 pound weight gain and increased dyspnea. It's difficult to assess his weight because he is wearing a boot. I increased his Lasix to 80 mg daily for 3 days. It's a very fine balance between his kidney failure, chronic hypotension, and heart failure. Creatinine last week at Dr. Cornelia Copa office was 2.02.  Since last week the patient is not feeling better. Initially he seemed to diurese but then developed decreased urine output and worsening shortness of breath. His weight is only down 2 pounds from last week. He saw his  urologist yesterday and is having hematuria. He was found to have recurrent bladder tumors that need to come out. Dr. Louis Meckel recommends an IVC filter and surgery on the tumors.He had coffee colored urine when scoped yesterday. He was very short of breath yesterday afternoon. He has not urinated at all today despite drinking 2 cups of coffee,a Coke and 1 1/2 bottles of 16 oz H2O. He has no appetite and isn't eating much.    Past Medical History  Diagnosis Date  . Chronic constipation   . GERD (gastroesophageal reflux disease)   . Arthritis   . Hyperlipidemia   . Orthostatic hypotension 11/19/2013  . Coronary artery disease     CARDIOLOGIST-  DR BERRY  --  HX  STEMI  12-17-2013  S/P  CABG X4  12-18-2013  . Mild dementia   . Type 2 diabetes mellitus   . Hypothyroidism   . History of esophageal dilatation   . History of gastritis   . History of hiatal hernia   . History of bladder cancer     hx  TCC low grade  . Prostate cancer     low-grade  . Kidney tumor     right  . BPH (benign prostatic hyperplasia)   . CKD (chronic kidney disease) stage 3, GFR 30-59 ml/min   . History of kidney stones   . ED (erectile dysfunction)   . S/P CABG x 4     12-18-2013  .  Bladder tumor   . Complication of anesthesia     HARD TO WAKE  . OSA on CPAP     mild osa   per sleep study 07-11-2009- patient states does not use regularly    Past Surgical History  Procedure Laterality Date  . Upper gastrointestinal endoscopy  04/06/2010  &  01-08-2013    w/ esophageal dilatation  . Hemorrhoid surgery    . Hand surgery      right  . Wrist fracture surgery      pins placed-left  . Cystoscopy with biopsy  01/09/2012    Procedure: CYSTOSCOPY WITH BIOPSY;  Surgeon: Marissa Nestle, MD;  Location: [facility];  Service: Urology;  Laterality: N/A;  Cystoscopy with Multiple Bladder Biopsies  . Esophagogastroduodenoscopy (egd) with esophageal dilation N/A 01/08/2013    Procedure: ESOPHAGOGASTRODUODENOSCOPY (EGD)  WITH ESOPHAGEAL DILATION;  Surgeon: Rogene Houston, MD;  Location: [facility];  Service: Endoscopy;  Laterality: N/A;  120  . Left heart cath N/A 12/17/2013    Procedure: LEFT HEART CATH;  Surgeon: Leonie Man, MD;  Location: [facility] CATH LAB;  Service: Cardiovascular;  Laterality: N/A;  . Left heart catheterization with coronary angiogram N/A 12/17/2013    Procedure: LEFT HEART CATHETERIZATION WITH CORONARY ANGIOGRAM;  Surgeon: Leonie Man, MD;  Location: [facility] CATH LAB;  Service: Cardiovascular;  Laterality: N/A;  . Colonoscopy with esophagogastroduodenoscopy (egd)  08-15-2006    w/ esophageal dilatation  . Umbilical hernia repair  04-24-2003  . Transurethral resection of bladder tumor  06-14-2004  . Transurethral resection of prostate  10-04-2004  . Cysto/  removal bladder calculus/  resection bladder tumor  10-16-2006  . Cystostomy w/ bladder biopsy  01-09-2012  . Cardiac catheterization  12-17-2013   dr Shanon Brow harding    severe disease RCA with thrombotic appearing subtotal occlusions/  95-99%  OM2/  diffuse disease LAD  and D1/  mild to moderate basal to mid inferior hypokinesis/  severe elevated LVEDP/  ef 45-50%  . Coronary artery bypass graft N/A 12/18/2013    Procedure: Coronary artery bypass grafting times four using left internal mammary artery and endoscopically harvested right saphenous vein graft;  Surgeon: Gaye Pollack, MD;  Location: [facility] OR;  Service: Open Heart Surgery;  Laterality: N/A;  . Transthoracic echocardiogram  11-14-2012    grade I diastolic dysfunction/  ef 55-60%/  trivial MR  and TR  . Cardiovascular stress test  08-09-2009  dr berry    mild to moderate perfusion defect in basal , mid, and apical inferior regions consistent with an infart/scar/   No evidence ischemia/  ef 51%/  no ECG changes/   low risk scan  . Transurethral resection of bladder tumor N/A 04/07/2014    Procedure: TRANSURETHRAL RESECTION OF BLADDER TUMOR (TURBT);  Surgeon: Arvil Persons, MD;   Location: [facility];  Service: Urology;  Laterality: N/A;  . Cystoscopy/retrograde/ureteroscopy Right 04/07/2014    Procedure: CYSTOSCOPY/RETROGRADE/URETEROSCOPY WITH BIOPSY OF RENAL PELVIS TUMOR;  Surgeon: Arvil Persons, MD;  Location: [facility];  Service: Urology;  Laterality: Right;  . Robot assisted laparoscopic nephrectomy Right 06/05/2014    Procedure: ROBOTIC ASSISTED LAPAROSCOPIC RIGHT NEPHROURETERECTOMY, VENTRAL HERNIA REPAIR ;  Surgeon: Ardis Hughs, MD;  Location: [facility];  Service: Urology;  Laterality: Right;  . Cystoscopy w/ ureteral stent placement Right 06/05/2014    Procedure: CYSTOSCOPY ;  Surgeon: Ardis Hughs, MD;  Location: [facility];  Service: Urology;  Laterality: Right;  .  Radiology with anesthesia N/A 06/22/2014    Procedure: MRI BRAIN W/O CONTRAST;  Surgeon: Medication Radiologist, MD;  Location: [facility];  Service: Radiology;  Laterality: N/A;     Current Outpatient Prescriptions  Medication Sig Dispense Refill  . aspirin 81 MG chewable tablet Chew 1 tablet (81 mg total) by mouth daily.    [name] donepezil (ARICEPT) 10 MG tablet Take 10 mg by mouth at bedtime.    [name] escitalopram (LEXAPRO) 20 MG tablet Take 20 mg by mouth daily.    . feeding supplement, ENSURE ENLIVE, (ENSURE ENLIVE) LIQD Take 237 mLs by mouth 3 (three) times daily between meals. 237 mL 12  . fludrocortisone (FLORINEF) 0.1 MG tablet Take 0.1 mg by mouth 2 (two) times daily.    . furosemide (LASIX) 80 MG tablet Take 1 tablet (80 mg total) by mouth daily. 30 tablet 6  . levothyroxine (SYNTHROID, LEVOTHROID) 125 MCG tablet Take 125 mcg by mouth daily before breakfast.    . midodrine (PROAMATINE) 10 MG tablet Take 10 mg by mouth 2 (two) times daily.    . pantoprazole (PROTONIX) 40 MG tablet TAKE ONE TABLET EVERY MORNING 30 tablet 4  . polyethylene glycol (MIRALAX / GLYCOLAX) packet Take 17 g by mouth at bedtime.    . Rivaroxaban (XARELTO STARTER PACK) 15 & 20 MG TBPK Take as  directed on package: Start with one 15mg  tablet by mouth twice a day with food. On Day 22, switch to one 20mg  tablet once a day with food. 51 each 0  . ziprasidone (GEODON) 20 MG capsule Take 1 capsule (20 mg total) by mouth 2 (two) times daily with a meal. 60 capsule 0   No current facility-administered medications for this visit.    Allergies:   Lorazepam and Statins    Social History:  The patient  reports that he has quit smoking. His smoking use included Cigarettes. He has a 4 pack-year smoking history. He quit smokeless tobacco use about 54 years ago. His smokeless tobacco use included Chew. He reports that he does not drink alcohol or use illicit drugs.   Family History:  The patient's    family history includes Cancer - Lung in his brother.    ROS:  Please see the history of present illness.   Otherwise, review of systems are positive for  Falls asleep easily, hematuria, decreased urine output, weakness imbalance issues.   All other systems are reviewed and negative.    PHYSICAL EXAM: BP 118/68 mmHg  Pulse 79  Ht 5' 10.5" (1.791 m)  Wt 221 lb (100.245 kg)  BMI 31.25 kg/m2  SpO2 91% GEN: Well nourished, well developed, in no acute distress Neck:  increased JVD, HJR,  No carotid bruits, or masses Cardiac: RRR;  2/6 systolic murmur at left sternal border, no gallop, rubs, thrill or heave,  Respiratory:  clear to auscultation bilaterally, normal work of breathing GI: soft, nontender, nondistended, + BS MS: no deformity or atrophy Extremities:  +1-2 edema right greater than left right leg in a boot  otherwisewithout cyanosis, clubbing,  good distal pulses bilaterally.  Skin: warm and dry, no rash Neuro:  Strength and sensation are intact    EKG:  EKG is not ordered today.    Recent Labs: 12/19/2013: Magnesium 1.9 08/17/2014: ALT 40; B Natriuretic Peptide 2588.0*; TSH 5.346* 08/20/2014: BUN 28*; Creatinine 1.83*; Hemoglobin 11.0*; Platelets 389; Potassium 3.5; Sodium 138     Lipid Panel No results found for: CHOL, TRIG, HDL, CHOLHDL, VLDL, LDLCALC, LDLDIRECT  Wt Readings from Last 3 Encounters:  09/02/14 221 lb (100.245 kg)  08/26/14 223 lb 6.4 oz (101.334 kg)  08/20/14 209 lb 8 oz (95.029 kg)      Other studies Reviewed: Additional studies/ records that were reviewed today include and review of the records demonstrates:  Cardiac Studies: Echo 08/17/14 Study Conclusions  - Left ventricle: The cavity size was mildly dilated. Wall   thickness was normal. Systolic function was mildly to moderately   reduced. The estimated ejection fraction was in the range of 40%   to 45%. Findings consistent with left ventricular diastolic   dysfunction. Doppler parameters are consistent with high   ventricular filling pressure. - Regional wall motion abnormality: Mild hypokinesis of the   basal-mid inferior and basal-mid inferolateral myocardium. - Ventricular septum: Septal motion showed abnormal function and   dyssynergy. These changes are consistent with intraventricular   conduction delay. - Aortic valve: Moderately calcified annulus. - Mitral valve: There was moderate eccentric regurgitation. - Right ventricle: The cavity size was mildly dilated. Systolic   function was moderately to severely reduced. - Tricuspid valve: There was mild-moderate regurgitation. - Pulmonic valve: Moderately elevated pulmonary pressures (48   mmHg). - Systemic veins: Dilated IVC, with normal respiratory variation.   Estimated CVP 8 mmHg.      ASSESSMENT AND PLAN:  Acute on chronic systolic and diastolic heart failure, NYHA class 3  Patient continues to have trouble with heart failure. He did better initially when I increased his Lasix to 80 mg daily for 3 days but it reaccumulated on the 40 mg daily. He had blood work this morning but we do not have back yet. It's a very tricky balance to manage his heart failure, kidney disease, and hypotension. Discussed this patient  with Dr. Domenic Polite. Will increase Lasix to 80 mg daily. His.daughter in law is  instructed to bring him to the emergency room if he has any worsening in his breathing or edema.  O2 sats in the office are between 91 and 97%.   Lower leg DVT (deep venous thrombosis)  Patient was recently diagnosed with right lower extremity DVT. Presumed that he had a PE. He was placed on low dose Xarelto 15 mg twice a day and then on day 22 switch to 20 mg once daily. He is now having hematuria and was found to have recurring bladder tumors that need to be removed according to Dr. Louis Meckel with [facility]. He is hoping the patient can have an IVC filter to have this done. We'll have patient see Dr.Koneswaran next week to discuss.  This patient is very high risk for any type of surgery and clotting with his multiple medical problems.   Orthostatic hypotension  Continue Florinef and Midodrin.   CKD (chronic kidney disease) stage 3, GFR 30-59 ml/min  Patient only has one kidney. He had blood work this morning but we still don't have back yet. We increased his Lasix to 80 mg daily. He has not urinated yet today. He may  Need tocome to the hospital for IV diuresis if he does not improve.   S/P CABG x 4  Stable without chest pain.   Hematuria  Patient is currently having hematuria on low-dose Xarelto. He was found to have recurrent bladder tumors that need to be removed. He is being followed closely by urology. See above dictation.     Signed, Ermalinda Barrios, PA-C  09/02/2014 3:28 PM    [facility]  [address], [place], [place]  [address] Phone: [phone]; Fax: [phone]

## 2014-09-02 NOTE — Assessment & Plan Note (Signed)
Patient only has one kidney. He had blood work this morning but we still don't have back yet. We increased his Lasix to 80 mg daily. He has not urinated yet today. He may  Need tocome to the hospital for IV diuresis if he does not improve.

## 2014-09-02 NOTE — Assessment & Plan Note (Addendum)
Patient was recently diagnosed with right lower extremity DVT. Presumed that he had a PE. He was placed on low dose Xarelto 15 mg twice a day and then on day 22 switch to 20 mg once daily. He is now having hematuria and was found to have recurring bladder tumors that need to be removed according to Dr. Louis Meckel with [facility]. He is hoping the patient can have an IVC filter to have this done. We'll have patient see Dr.Koneswaran next week to discuss.  This patient is very high risk for any type of surgery and clotting with his multiple medical problems.

## 2014-09-02 NOTE — Assessment & Plan Note (Signed)
Continue Florinef and Midodrin.

## 2014-09-02 NOTE — Assessment & Plan Note (Signed)
Stable without chest pain 

## 2014-09-02 NOTE — Assessment & Plan Note (Addendum)
Patient is currently having hematuria on low-dose Xarelto. He was found to have recurrent bladder tumors that need to be removed. He is being followed closely by urology. See above dictation.

## 2014-09-02 NOTE — Assessment & Plan Note (Addendum)
Patient continues to have trouble with heart failure. He did better initially when I increased his Lasix to 80 mg daily for 3 days but it reaccumulated on the 40 mg daily. He had blood work this morning but we do not have back yet. It's a very tricky balance to manage his heart failure, kidney disease, and hypotension. Discussed this patient with Dr. Domenic Polite. Will increase Lasix to 80 mg daily. His.daughter in law is  instructed to bring him to the emergency room if he has any worsening in his breathing or edema.  O2 sats in the office are between 91 and 97%.

## 2014-09-02 NOTE — Patient Instructions (Signed)
Your physician recommends that you schedule a follow-up appointment in: next week with Dr. Bronson Ing  Your physician has recommended you make the following change in your medication:  Increase Lasix to 80 mg daily

## 2014-09-03 ENCOUNTER — Telehealth: Payer: Self-pay | Admitting: Cardiovascular Disease

## 2014-09-03 LAB — BASIC METABOLIC PANEL
BUN: 26 mg/dL — AB (ref 6–23)
CO2: 26 meq/L (ref 19–32)
Calcium: 9.6 mg/dL (ref 8.4–10.5)
Chloride: 104 mEq/L (ref 96–112)
Creat: 1.92 mg/dL — ABNORMAL HIGH (ref 0.50–1.35)
Glucose, Bld: 144 mg/dL — ABNORMAL HIGH (ref 70–99)
Potassium: 4.1 mEq/L (ref 3.5–5.3)
Sodium: 140 mEq/L (ref 135–145)

## 2014-09-03 NOTE — Addendum Note (Signed)
Addended by: Levonne Hubert on: 09/03/2014 11:29 AM   Modules accepted: Orders

## 2014-09-03 NOTE — Telephone Encounter (Signed)
pls call concerning lab work and mediciation

## 2014-09-03 NOTE — Telephone Encounter (Signed)
LM with michelle Ninh that labs are in process

## 2014-09-03 NOTE — Telephone Encounter (Signed)
pls call concerning lab work and medication

## 2014-09-04 ENCOUNTER — Other Ambulatory Visit (HOSPITAL_COMMUNITY): Payer: Self-pay | Admitting: Urology

## 2014-09-04 DIAGNOSIS — N183 Chronic kidney disease, stage 3 (moderate): Secondary | ICD-10-CM | POA: Diagnosis not present

## 2014-09-04 DIAGNOSIS — F0390 Unspecified dementia without behavioral disturbance: Secondary | ICD-10-CM | POA: Diagnosis not present

## 2014-09-04 DIAGNOSIS — C672 Malignant neoplasm of lateral wall of bladder: Secondary | ICD-10-CM

## 2014-09-04 DIAGNOSIS — I252 Old myocardial infarction: Secondary | ICD-10-CM | POA: Diagnosis not present

## 2014-09-04 DIAGNOSIS — G4733 Obstructive sleep apnea (adult) (pediatric): Secondary | ICD-10-CM | POA: Diagnosis not present

## 2014-09-04 DIAGNOSIS — E119 Type 2 diabetes mellitus without complications: Secondary | ICD-10-CM | POA: Diagnosis not present

## 2014-09-04 DIAGNOSIS — R269 Unspecified abnormalities of gait and mobility: Secondary | ICD-10-CM | POA: Diagnosis not present

## 2014-09-04 DIAGNOSIS — I2510 Atherosclerotic heart disease of native coronary artery without angina pectoris: Secondary | ICD-10-CM | POA: Diagnosis not present

## 2014-09-04 DIAGNOSIS — E039 Hypothyroidism, unspecified: Secondary | ICD-10-CM | POA: Diagnosis not present

## 2014-09-04 DIAGNOSIS — Z48815 Encounter for surgical aftercare following surgery on the digestive system: Secondary | ICD-10-CM | POA: Diagnosis not present

## 2014-09-04 DIAGNOSIS — M6281 Muscle weakness (generalized): Secondary | ICD-10-CM | POA: Diagnosis not present

## 2014-09-04 DIAGNOSIS — I5033 Acute on chronic diastolic (congestive) heart failure: Secondary | ICD-10-CM | POA: Diagnosis not present

## 2014-09-04 DIAGNOSIS — N179 Acute kidney failure, unspecified: Secondary | ICD-10-CM | POA: Diagnosis not present

## 2014-09-04 DIAGNOSIS — Z483 Aftercare following surgery for neoplasm: Secondary | ICD-10-CM | POA: Diagnosis not present

## 2014-09-08 DIAGNOSIS — I951 Orthostatic hypotension: Secondary | ICD-10-CM | POA: Diagnosis not present

## 2014-09-09 ENCOUNTER — Encounter: Payer: Self-pay | Admitting: Cardiovascular Disease

## 2014-09-09 ENCOUNTER — Ambulatory Visit (INDEPENDENT_AMBULATORY_CARE_PROVIDER_SITE_OTHER): Payer: Medicare Other | Admitting: Cardiovascular Disease

## 2014-09-09 VITALS — BP 108/60 | HR 55 | Ht 70.0 in | Wt 217.0 lb

## 2014-09-09 DIAGNOSIS — I2510 Atherosclerotic heart disease of native coronary artery without angina pectoris: Secondary | ICD-10-CM | POA: Diagnosis not present

## 2014-09-09 DIAGNOSIS — N183 Chronic kidney disease, stage 3 unspecified: Secondary | ICD-10-CM

## 2014-09-09 DIAGNOSIS — E785 Hyperlipidemia, unspecified: Secondary | ICD-10-CM | POA: Diagnosis not present

## 2014-09-09 DIAGNOSIS — R319 Hematuria, unspecified: Secondary | ICD-10-CM

## 2014-09-09 DIAGNOSIS — I951 Orthostatic hypotension: Secondary | ICD-10-CM

## 2014-09-09 DIAGNOSIS — I5043 Acute on chronic combined systolic (congestive) and diastolic (congestive) heart failure: Secondary | ICD-10-CM | POA: Diagnosis not present

## 2014-09-09 DIAGNOSIS — I824Z1 Acute embolism and thrombosis of unspecified deep veins of right distal lower extremity: Secondary | ICD-10-CM | POA: Diagnosis not present

## 2014-09-09 DIAGNOSIS — Z951 Presence of aortocoronary bypass graft: Secondary | ICD-10-CM

## 2014-09-09 LAB — BASIC METABOLIC PANEL
BUN: 21 mg/dL (ref 6–23)
CO2: 26 mEq/L (ref 19–32)
CREATININE: 1.87 mg/dL — AB (ref 0.50–1.35)
Calcium: 8.9 mg/dL (ref 8.4–10.5)
Chloride: 99 mEq/L (ref 96–112)
Glucose, Bld: 115 mg/dL — ABNORMAL HIGH (ref 70–99)
POTASSIUM: 3.6 meq/L (ref 3.5–5.3)
SODIUM: 143 meq/L (ref 135–145)

## 2014-09-09 NOTE — Progress Notes (Addendum)
Patient ID: [name] [name], male   DOB: Nov 10, 1933, 81 y.o.   MRN: [id number]      SUBJECTIVE: [name] [name] is a 81 y.o. male who presents forfollow-up of combined systolic and diastolic heart failure. He is a very complicated patient with CAD status post STEMI treated with PCI of the RCA in 12/2013 followed by CABG with a LIMA to the LAD, SVG to the OM, SVG to the diagonal branch and PDA. EF 45-50% by LV gram in 12/2013. He also has orthostatic hypotension and is on Florinef and Midrin. He has hyperlipidemia intolerant to statins, obstructive sleep apnea on CPap, dementia and CKD stage III.  He was admitted to the hospital on 08/17/14 with acute on chronic systolic and diastolic CHF. His troponins were elevated and he had RV dysfunction on 2-D echo, EF 40-45%. He was treated medically because of CKD with one kidney and high risk of contrast mediated nephropathy. He also had significant right lower extremity edema and venous Dopplers confirmed DVT and he was presumed to have a PE. He is at high risk of falls because of his orthostatic hypotension but also at risk for pulmonary embolism. He does have caregivers with him at home. It was recommended that he be treated with Xarelto for 3-6 months. His aspirin was decreased to 81 mg daily.  He has followed up with Ermalinda Barrios PA-C on two occasions since being hospitalized. He had dyspnea, weight gain, and leg swelling. Lasix was increased to 80 mg daily for 3 days and he began to improve, but problems recurred after resuming 40 mg daily. It's a very fine balance between his kidney failure, chronic hypotension, and heart failure. He was found to have recurrent bladder tumors that need to be removed. Dr. Louis Meckel recommends an IVC filter and surgery on the tumors.  Wt today: 217 lbs (221 lbs on 5/25)  BUN/SCr 21/1.87 on 5/31 (26/1.92 one week prior)  He is here with his daughter-in-law, [name].  He is doing much better and says he feels well. Currently  denies chest pain and shortness of breath. Leg swelling has also significantly improved on Lasix 80 mg daily.  He is scheduled to undergo chest CT angiography to confirm presence of pulmonary embolism this Friday.   Review of Systems: As per "subjective", otherwise negative.  Allergies  Allergen Reactions  . Lorazepam     Hallucinations, worsens agitation  . Statins Other (See Comments)    MUSCLE ACHES    Current Outpatient Prescriptions  Medication Sig Dispense Refill  . aspirin 81 MG chewable tablet Chew 1 tablet (81 mg total) by mouth daily.    [name] donepezil (ARICEPT) 10 MG tablet Take 10 mg by mouth at bedtime.    [name] escitalopram (LEXAPRO) 20 MG tablet Take 20 mg by mouth daily.    . feeding supplement, ENSURE ENLIVE, (ENSURE ENLIVE) LIQD Take 237 mLs by mouth 3 (three) times daily between meals. 237 mL 12  . fludrocortisone (FLORINEF) 0.1 MG tablet Take 0.1 mg by mouth 2 (two) times daily.    . furosemide (LASIX) 80 MG tablet Take 1 tablet (80 mg total) by mouth daily. 30 tablet 6  . levothyroxine (SYNTHROID, LEVOTHROID) 125 MCG tablet Take 125 mcg by mouth daily before breakfast.    . midodrine (PROAMATINE) 10 MG tablet Take 10 mg by mouth 2 (two) times daily.    . pantoprazole (PROTONIX) 40 MG tablet TAKE ONE TABLET EVERY MORNING 30 tablet 4  . polyethylene glycol (MIRALAX /  GLYCOLAX) packet Take 17 g by mouth at bedtime.    . Rivaroxaban (XARELTO STARTER PACK) 15 & 20 MG TBPK Take as directed on package: Start with one 15mg  tablet by mouth twice a day with food. On Day 22, switch to one 20mg  tablet once a day with food. 51 each 0  . ziprasidone (GEODON) 20 MG capsule Take 1 capsule (20 mg total) by mouth 2 (two) times daily with a meal. 60 capsule 0   No current facility-administered medications for this visit.    Past Medical History  Diagnosis Date  . Chronic constipation   . GERD (gastroesophageal reflux disease)   . Arthritis   . Hyperlipidemia   . Orthostatic  hypotension 11/19/2013  . Coronary artery disease     CARDIOLOGIST-  DR BERRY  --  HX  STEMI  12-17-2013  S/P  CABG X4  12-18-2013  . Mild dementia   . Type 2 diabetes mellitus   . Hypothyroidism   . History of esophageal dilatation   . History of gastritis   . History of hiatal hernia   . History of bladder cancer     hx  TCC low grade  . Prostate cancer     low-grade  . Kidney tumor     right  . BPH (benign prostatic hyperplasia)   . CKD (chronic kidney disease) stage 3, GFR 30-59 ml/min   . History of kidney stones   . ED (erectile dysfunction)   . S/P CABG x 4     12-18-2013  . Bladder tumor   . Complication of anesthesia     HARD TO WAKE  . OSA on CPAP     mild osa   per sleep study 07-11-2009- patient states does not use regularly    Past Surgical History  Procedure Laterality Date  . Upper gastrointestinal endoscopy  04/06/2010  &  01-08-2013    w/ esophageal dilatation  . Hemorrhoid surgery    . Hand surgery      right  . Wrist fracture surgery      pins placed-left  . Cystoscopy with biopsy  01/09/2012    Procedure: CYSTOSCOPY WITH BIOPSY;  Surgeon: Marissa Nestle, MD;  Location: [facility];  Service: Urology;  Laterality: N/A;  Cystoscopy with Multiple Bladder Biopsies  . Esophagogastroduodenoscopy (egd) with esophageal dilation N/A 01/08/2013    Procedure: ESOPHAGOGASTRODUODENOSCOPY (EGD) WITH ESOPHAGEAL DILATION;  Surgeon: Rogene Houston, MD;  Location: [facility];  Service: Endoscopy;  Laterality: N/A;  120  . Left heart cath N/A 12/17/2013    Procedure: LEFT HEART CATH;  Surgeon: Leonie Man, MD;  Location[facility] C[facility] LAB;  Service: Cardiovascular;  Laterality: N/A;  . Left heart catheterization with coronary angiogram N/A 12/17/2013    Procedure: LEFT HEART CATHETERIZATION WITH CORONARY ANGIOGRAM;  Surgeon: Leonie Man, MD;  Location: [facility] CATH LAB;  Service: Cardiovascular;  Laterality: N/A;  . Colonoscopy with esophagogastroduodenoscopy (egd)  08-15-2006      w/ esophageal dilatation  . Umbilical hernia repair  04-24-2003  . Transurethral resection of bladder tumor  06-14-2004  . Transurethral resection of prostate  10-04-2004  . Cysto/  removal bladder calculus/  resection bladder tumor  10-16-2006  . Cystostomy w/ bladder biopsy  01-09-2012  . Cardiac catheterization  12-17-2013   dr Shanon Brow harding    severe disease RCA with thrombotic appearing subtotal occlusions/  95-99%  OM2/  diffuse disease LAD  and D1/  mild to moderate basal to  mid inferior hypokinesis/  severe elevated LVEDP/  ef 45-50%  . Coronary artery bypass graft N/A 12/18/2013    Procedure: Coronary artery bypass grafting times four using left internal mammary artery and endoscopically harvested right saphenous vein graft;  Surgeon: Gaye Pollack, MD;  Location: [facility] OR;  Service: Open Heart Surgery;  Laterality: N/A;  . Transthoracic echocardiogram  11-14-2012    grade I diastolic dysfunction/  ef 55-60%/  trivial MR  and TR  . Cardiovascular stress test  08-09-2009  dr berry    mild to moderate perfusion defect in basal , mid, and apical inferior regions consistent with an infart/scar/   No evidence ischemia/  ef 51%/  no ECG changes/   low risk scan  . Transurethral resection of bladder tumor N/A 04/07/2014    Procedure: TRANSURETHRAL RESECTION OF BLADDER TUMOR (TURBT);  Surgeon: Arvil Persons, MD;  Location: [facility];  Service: Urology;  Laterality: N/A;  . Cystoscopy/retrograde/ureteroscopy Right 04/07/2014    Procedure: CYSTOSCOPY/RETROGRADE/URETEROSCOPY WITH BIOPSY OF RENAL PELVIS TUMOR;  Surgeon: Arvil Persons, MD;  Location: [facility];  Service: Urology;  Laterality: Right;  . Robot assisted laparoscopic nephrectomy Right 06/05/2014    Procedure: ROBOTIC ASSISTED LAPAROSCOPIC RIGHT NEPHROURETERECTOMY, VENTRAL HERNIA REPAIR ;  Surgeon: Ardis Hughs, MD;  Location: [facility];  Service: Urology;  Laterality: Right;  . Cystoscopy w/ ureteral  stent placement Right 06/05/2014    Procedure: CYSTOSCOPY ;  Surgeon: Ardis Hughs, MD;  Location: [facility];  Service: Urology;  Laterality: Right;  . Radiology with anesthesia N/A 06/22/2014    Procedure: MRI BRAIN W/O CONTRAST;  Surgeon: Medication Radiologist, MD;  Location: [facility];  Service: Radiology;  Laterality: N/A;    History   Social History  . Marital Status: Divorced    Spouse Name: N/A  . Number of Children: 3  . Years of Education: HS   Occupational History  .     Social History Main Topics  . Smoking status: Former Smoker -- 1.00 packs/day for 4 years    Types: Cigarettes    Start date: 09/08/1952    Quit date: 09/09/1959  . Smokeless tobacco: Former Systems developer    Types: [place] date: 01/03/1960  . Alcohol Use: No  . Drug Use: No  . Sexual Activity: Yes    Birth Control/ Protection: None   Other Topics Concern  . Not on file   Social History Narrative   Patient is single and lives alone.   Patient is retired.   Patient has a high school education.   Patient is right-handed.   Patient drinks one cup of coffee and one cup of soda or tea daily.   Patient has three adult children.     Filed Vitals:   09/09/14 1358  BP: 108/60  Pulse: 55  Height: 5\' 10"  (1.778 m)  Weight: 217 lb (98.431 kg)  SpO2: 95%    PHYSICAL EXAM General: NAD HEENT: Normal. Neck: No JVD, no thyromegaly. Lungs: Clear to auscultation bilaterally with normal respiratory effort. CV: Nondisplaced PMI.  Regular rate and rhythm, normal S1/S2, no S3/S4, no murmur. Trivial pretibial edema b/l Abdomen: Soft, nontender, no distention.  Neurologic: Alert and oriented.  Psych: Normal affect. Skin: Normal. Musculoskeletal: Normal range of motion, no gross deformities. Extremities: No clubbing or cyanosis.   ECG: Most recent ECG reviewed.     ASSESSMENT AND PLAN:  Acute on chronic systolic and diastolic heart failure, NYHA class  3 Appears to be close to be close to euvolemia  on Lasix 80 mg daily with stable renal function. Continue with repeat BMET in future.  Lower leg DVT (deep venous thrombosis) Patient was recently diagnosed with right lower extremity DVT. Presumed that he had a PE and scheduled to have chest CT angiography this Friday. He was placed on low dose Xarelto 15 mg twice a day and then on day 22 switch to 20 mg once daily. He has been having hematuria and was found to have recurring bladder tumors that need to be removed according to Dr. Louis Meckel with [facility]. He is hoping the patient can have an IVC filter to have this done. The alternative would be to operate on Xarelto, Lovenox, or heparin. I will need to discuss this with his urologist. This patient is very high risk for a major adverse cardiac event for any type of surgery with his multiple medical problems.  Orthostatic hypotension Continue Florinef and Midodrin.  CKD (chronic kidney disease) stage 3, GFR 30-59 ml/min Patient only has one kidney. Lab results noted above. Continue Lasix 80 mg daily.   S/P CABG x 4 Stable without chest pain. Continue ASA 81 mg.  Hematuria with bladder tumors Patient is currently having hematuria on low-dose Xarelto. He was found to have recurrent bladder tumors that need to be removed. He is being followed closely by urology.   Dispo: f/u 6 weeks. I will speak with urology to discuss his case further.   Time spent: 40 minutes, of which greater than 50% was spent reviewing symptoms, relevant blood tests and studies, and discussing management plan with the patient.   Kate Sable, M.D., F.A.C.C.

## 2014-09-09 NOTE — Patient Instructions (Signed)
Your physician recommends that you schedule a follow-up appointment in: 6 weeks with Dr Bronson Ing   Your physician recommends that you continue on your current medications as directed. Please refer to the Current Medication list given to you today.     Thank you for choosing [facility] !

## 2014-09-11 ENCOUNTER — Ambulatory Visit (HOSPITAL_COMMUNITY)
Admission: RE | Admit: 2014-09-11 | Discharge: 2014-09-11 | Disposition: A | Discharge status: Home / Self Care | Payer: Medicare Other | Source: Ambulatory Visit | Attending: Urology | Admitting: Urology

## 2014-09-11 ENCOUNTER — Other Ambulatory Visit: Payer: Self-pay | Admitting: Urology

## 2014-09-11 DIAGNOSIS — I82401 Acute embolism and thrombosis of unspecified deep veins of right lower extremity: Secondary | ICD-10-CM | POA: Diagnosis not present

## 2014-09-11 DIAGNOSIS — R918 Other nonspecific abnormal finding of lung field: Secondary | ICD-10-CM | POA: Insufficient documentation

## 2014-09-11 DIAGNOSIS — I517 Cardiomegaly: Secondary | ICD-10-CM | POA: Diagnosis not present

## 2014-09-11 DIAGNOSIS — J90 Pleural effusion, not elsewhere classified: Secondary | ICD-10-CM | POA: Diagnosis not present

## 2014-09-11 DIAGNOSIS — C672 Malignant neoplasm of lateral wall of bladder: Secondary | ICD-10-CM

## 2014-09-11 MED ORDER — IOHEXOL 350 MG/ML SOLN
100.0000 mL | Freq: Once | INTRAVENOUS | Status: AC | PRN
  Administered 2014-09-11: 80 mL via INTRAVENOUS

## 2014-09-14 ENCOUNTER — Other Ambulatory Visit (HOSPITAL_COMMUNITY): Payer: Self-pay | Admitting: Family Medicine

## 2014-09-14 ENCOUNTER — Ambulatory Visit (HOSPITAL_COMMUNITY)
Admission: RE | Admit: 2014-09-14 | Discharge: 2014-09-14 | Disposition: A | Discharge status: Home / Self Care | Payer: Medicare Other | Source: Ambulatory Visit | Attending: Family Medicine | Admitting: Family Medicine

## 2014-09-14 DIAGNOSIS — M79672 Pain in left foot: Secondary | ICD-10-CM

## 2014-09-14 DIAGNOSIS — E6609 Other obesity due to excess calories: Secondary | ICD-10-CM | POA: Diagnosis not present

## 2014-09-14 DIAGNOSIS — M7732 Calcaneal spur, left foot: Secondary | ICD-10-CM | POA: Diagnosis not present

## 2014-09-14 DIAGNOSIS — Z6832 Body mass index (BMI) 32.0-32.9, adult: Secondary | ICD-10-CM | POA: Diagnosis not present

## 2014-09-14 DIAGNOSIS — M25572 Pain in left ankle and joints of left foot: Secondary | ICD-10-CM | POA: Diagnosis not present

## 2014-09-16 ENCOUNTER — Other Ambulatory Visit: Payer: Self-pay | Admitting: Physician Assistant

## 2014-09-16 DIAGNOSIS — I509 Heart failure, unspecified: Secondary | ICD-10-CM | POA: Diagnosis not present

## 2014-09-16 DIAGNOSIS — N19 Unspecified kidney failure: Secondary | ICD-10-CM | POA: Diagnosis not present

## 2014-09-16 DIAGNOSIS — F328 Other depressive episodes: Secondary | ICD-10-CM | POA: Diagnosis not present

## 2014-09-16 DIAGNOSIS — S82301D Unspecified fracture of lower end of right tibia, subsequent encounter for closed fracture with routine healing: Secondary | ICD-10-CM | POA: Diagnosis not present

## 2014-09-17 ENCOUNTER — Ambulatory Visit (HOSPITAL_COMMUNITY)
Admission: RE | Admit: 2014-09-17 | Discharge: 2014-09-17 | Disposition: A | Discharge status: Home / Self Care | Payer: Medicare Other | Source: Ambulatory Visit | Attending: Urology | Admitting: Urology

## 2014-09-17 DIAGNOSIS — I82409 Acute embolism and thrombosis of unspecified deep veins of unspecified lower extremity: Secondary | ICD-10-CM | POA: Insufficient documentation

## 2014-09-17 DIAGNOSIS — I2510 Atherosclerotic heart disease of native coronary artery without angina pectoris: Secondary | ICD-10-CM | POA: Insufficient documentation

## 2014-09-17 DIAGNOSIS — N400 Enlarged prostate without lower urinary tract symptoms: Secondary | ICD-10-CM | POA: Diagnosis not present

## 2014-09-17 DIAGNOSIS — E1122 Type 2 diabetes mellitus with diabetic chronic kidney disease: Secondary | ICD-10-CM | POA: Diagnosis not present

## 2014-09-17 DIAGNOSIS — Z87891 Personal history of nicotine dependence: Secondary | ICD-10-CM | POA: Diagnosis not present

## 2014-09-17 DIAGNOSIS — K219 Gastro-esophageal reflux disease without esophagitis: Secondary | ICD-10-CM | POA: Diagnosis not present

## 2014-09-17 DIAGNOSIS — I82401 Acute embolism and thrombosis of unspecified deep veins of right lower extremity: Secondary | ICD-10-CM

## 2014-09-17 DIAGNOSIS — Z7901 Long term (current) use of anticoagulants: Secondary | ICD-10-CM | POA: Insufficient documentation

## 2014-09-17 DIAGNOSIS — Z905 Acquired absence of kidney: Secondary | ICD-10-CM | POA: Diagnosis not present

## 2014-09-17 DIAGNOSIS — Z8551 Personal history of malignant neoplasm of bladder: Secondary | ICD-10-CM | POA: Insufficient documentation

## 2014-09-17 DIAGNOSIS — K5909 Other constipation: Secondary | ICD-10-CM | POA: Diagnosis not present

## 2014-09-17 DIAGNOSIS — N183 Chronic kidney disease, stage 3 (moderate): Secondary | ICD-10-CM | POA: Insufficient documentation

## 2014-09-17 DIAGNOSIS — Z951 Presence of aortocoronary bypass graft: Secondary | ICD-10-CM | POA: Diagnosis not present

## 2014-09-17 DIAGNOSIS — I129 Hypertensive chronic kidney disease with stage 1 through stage 4 chronic kidney disease, or unspecified chronic kidney disease: Secondary | ICD-10-CM | POA: Diagnosis not present

## 2014-09-17 DIAGNOSIS — M1990 Unspecified osteoarthritis, unspecified site: Secondary | ICD-10-CM | POA: Diagnosis not present

## 2014-09-17 DIAGNOSIS — E785 Hyperlipidemia, unspecified: Secondary | ICD-10-CM | POA: Diagnosis not present

## 2014-09-17 DIAGNOSIS — Z8546 Personal history of malignant neoplasm of prostate: Secondary | ICD-10-CM | POA: Insufficient documentation

## 2014-09-17 DIAGNOSIS — F0390 Unspecified dementia without behavioral disturbance: Secondary | ICD-10-CM | POA: Diagnosis not present

## 2014-09-17 DIAGNOSIS — Z7982 Long term (current) use of aspirin: Secondary | ICD-10-CM | POA: Diagnosis not present

## 2014-09-17 DIAGNOSIS — Z85528 Personal history of other malignant neoplasm of kidney: Secondary | ICD-10-CM | POA: Insufficient documentation

## 2014-09-17 DIAGNOSIS — G4733 Obstructive sleep apnea (adult) (pediatric): Secondary | ICD-10-CM | POA: Diagnosis not present

## 2014-09-17 DIAGNOSIS — R319 Hematuria, unspecified: Secondary | ICD-10-CM | POA: Diagnosis not present

## 2014-09-17 DIAGNOSIS — I509 Heart failure, unspecified: Secondary | ICD-10-CM | POA: Insufficient documentation

## 2014-09-17 DIAGNOSIS — E039 Hypothyroidism, unspecified: Secondary | ICD-10-CM | POA: Insufficient documentation

## 2014-09-17 DIAGNOSIS — Z87442 Personal history of urinary calculi: Secondary | ICD-10-CM | POA: Diagnosis not present

## 2014-09-17 DIAGNOSIS — J90 Pleural effusion, not elsewhere classified: Secondary | ICD-10-CM | POA: Insufficient documentation

## 2014-09-17 DIAGNOSIS — N529 Male erectile dysfunction, unspecified: Secondary | ICD-10-CM | POA: Insufficient documentation

## 2014-09-17 HISTORY — PX: IVC FILTER PLACEMENT (ARMC HX): HXRAD1551

## 2014-09-17 LAB — BASIC METABOLIC PANEL
ANION GAP: 12 (ref 5–15)
BUN: 26 mg/dL — AB (ref 6–20)
CALCIUM: 8.8 mg/dL — AB (ref 8.9–10.3)
CHLORIDE: 97 mmol/L — AB (ref 101–111)
CO2: 32 mmol/L (ref 22–32)
CREATININE: 1.93 mg/dL — AB (ref 0.61–1.24)
GFR calc Af Amer: 37 mL/min — ABNORMAL LOW (ref >60)
GFR calc non Af Amer: 32 mL/min — ABNORMAL LOW (ref >60)
GLUCOSE: 117 mg/dL — AB (ref 65–99)
POTASSIUM: 2.9 mmol/L — AB (ref 3.5–5.1)
SODIUM: 141 mmol/L (ref 135–145)

## 2014-09-17 LAB — CBC WITH DIFFERENTIAL/PLATELET
Basophils Absolute: 0 10*3/uL (ref 0.0–0.1)
Basophils Relative: 1 % (ref 0–1)
EOS ABS: 0.4 10*3/uL (ref 0.0–0.7)
Eosinophils Relative: 5 % (ref 0–5)
HCT: 32.8 % — ABNORMAL LOW (ref 39.0–52.0)
HEMOGLOBIN: 10 g/dL — AB (ref 13.0–17.0)
LYMPHS ABS: 1.3 10*3/uL (ref 0.7–4.0)
Lymphocytes Relative: 18 % (ref 12–46)
MCH: 27.2 pg (ref 26.0–34.0)
MCHC: 30.5 g/dL (ref 30.0–36.0)
MCV: 89.1 fL (ref 78.0–100.0)
MONO ABS: 0.6 10*3/uL (ref 0.1–1.0)
MONOS PCT: 8 % (ref 3–12)
NEUTROS ABS: 4.7 10*3/uL (ref 1.7–7.7)
Neutrophils Relative %: 68 % (ref 43–77)
Platelets: 286 10*3/uL (ref 150–400)
RBC: 3.68 MIL/uL — ABNORMAL LOW (ref 4.22–5.81)
RDW: 15.5 % (ref 11.5–15.5)
WBC: 7 10*3/uL (ref 4.0–10.5)

## 2014-09-17 LAB — APTT: APTT: 41 s — AB (ref 24–37)

## 2014-09-17 LAB — PROTIME-INR
INR: 2.7 — AB (ref 0.00–1.49)
Prothrombin Time: 28.3 seconds — ABNORMAL HIGH (ref 11.6–15.2)

## 2014-09-17 LAB — GLUCOSE, CAPILLARY: Glucose-Capillary: 105 mg/dL — ABNORMAL HIGH (ref 65–99)

## 2014-09-17 MED ORDER — MIDAZOLAM HCL 2 MG/2ML IJ SOLN
INTRAMUSCULAR | Status: AC
  Filled 2014-09-17: qty 6

## 2014-09-17 MED ORDER — FENTANYL CITRATE (PF) 100 MCG/2ML IJ SOLN
INTRAMUSCULAR | Status: AC | PRN
  Administered 2014-09-17: 25 ug via INTRAVENOUS

## 2014-09-17 MED ORDER — HYDROCODONE-ACETAMINOPHEN 5-325 MG PO TABS: 1.0000 | ORAL_TABLET | ORAL | Status: DC | PRN

## 2014-09-17 MED ORDER — FENTANYL CITRATE (PF) 100 MCG/2ML IJ SOLN
INTRAMUSCULAR | Status: AC
  Filled 2014-09-17: qty 4

## 2014-09-17 MED ORDER — POTASSIUM CHLORIDE CRYS ER 20 MEQ PO TBCR
40.0000 meq | EXTENDED_RELEASE_TABLET | Freq: Once | ORAL | Status: AC
  Administered 2014-09-17: 40 meq via ORAL
  Filled 2014-09-17 (×2): qty 2

## 2014-09-17 MED ORDER — MIDAZOLAM HCL 2 MG/2ML IJ SOLN
INTRAMUSCULAR | Status: AC | PRN
  Administered 2014-09-17: 0.5 mg via INTRAVENOUS

## 2014-09-17 MED ORDER — LIDOCAINE HCL 1 % IJ SOLN
INTRAMUSCULAR | Status: AC
  Filled 2014-09-17: qty 20

## 2014-09-17 MED ORDER — SODIUM CHLORIDE 0.9 % IV SOLN
INTRAVENOUS | Status: DC
  Administered 2014-09-17: 11:00:00 via INTRAVENOUS

## 2014-09-17 NOTE — H&P (Signed)
Chief Complaint: "I'm getting a filter placed"  Referring Physician(s): Herrick,Benjamin W  History of Present Illness: [name] [name] is a 81 y.o. male with history of CAD/heart failure with prior CABG, OSA, right lower extremity DVT on Xarelto, chronic kidney disease, prior right nephrectomy and TURBT for low-grade papillary urothelial carcinoma. He's recently began to have hematuria and was also found to have recurrent bladder tumors that need resection. He presents today for IVC filter placement prior to further surgical intervention.  Past Medical History  Diagnosis Date  . Chronic constipation   . GERD (gastroesophageal reflux disease)   . Arthritis   . Hyperlipidemia   . Orthostatic hypotension 11/19/2013  . Coronary artery disease     CARDIOLOGIST-  DR BERRY  --  HX  STEMI  12-17-2013  S/P  CABG X4  12-18-2013  . Mild dementia   . Type 2 diabetes mellitus   . Hypothyroidism   . History of esophageal dilatation   . History of gastritis   . History of hiatal hernia   . History of bladder cancer     hx  TCC low grade  . Prostate cancer     low-grade  . Kidney tumor     right  . BPH (benign prostatic hyperplasia)   . CKD (chronic kidney disease) stage 3, GFR 30-59 ml/min   . History of kidney stones   . ED (erectile dysfunction)   . S/P CABG x 4     12-18-2013  . Bladder tumor   . Complication of anesthesia     HARD TO WAKE  . OSA on CPAP     mild osa   per sleep study 07-11-2009- patient states does not use regularly    Past Surgical History  Procedure Laterality Date  . Upper gastrointestinal endoscopy  04/06/2010  &  01-08-2013    w/ esophageal dilatation  . Hemorrhoid surgery    . Hand surgery      right  . Wrist fracture surgery      pins placed-left  . Cystoscopy with biopsy  01/09/2012    Procedure: CYSTOSCOPY WITH BIOPSY;  Surgeon: Marissa Nestle, MD;  Location: [facility];  Service: Urology;  Laterality: N/A;  Cystoscopy with Multiple Bladder  Biopsies  . Esophagogastroduodenoscopy (egd) with esophageal dilation N/A 01/08/2013    Procedure: ESOPHAGOGASTRODUODENOSCOPY (EGD) WITH ESOPHAGEAL DILATION;  Surgeon: Rogene Houston, MD;  Location: [facility];  Service: Endoscopy;  Laterality: N/A;  120  . Left heart cath N/A 12/17/2013    Procedure: LEFT HEART CATH;  Surgeon: Leonie Man, MD;  Location: [facility] C[facility] LAB;  Service: Cardiovascular;  Laterality: N/A;  . Left heart catheterization with coronary angiogram N/A 12/17/2013    Procedure: LEFT HEART CATHETERIZATION WITH CORONARY ANGIOGRAM;  Surgeon: Leonie Man, MD;  Location: [facility] CATH LAB;  Service: Cardiovascular;  Laterality: N/A;  . Colonoscopy with esophagogastroduodenoscopy (egd)  08-15-2006    w/ esophageal dilatation  . Umbilical hernia repair  04-24-2003  . Transurethral resection of bladder tumor  06-14-2004  . Transurethral resection of prostate  10-04-2004  . Cysto/  removal bladder calculus/  resection bladder tumor  10-16-2006  . Cystostomy w/ bladder biopsy  01-09-2012  . Cardiac catheterization  12-17-2013   dr Shanon Brow harding    severe disease RCA with thrombotic appearing subtotal occlusions/  95-99%  OM2/  diffuse disease LAD  and D1/  mild to moderate basal to mid inferior hypokinesis/  severe elevated LVEDP/  ef 45-50%  . Coronary artery bypass graft N/A 12/18/2013    Procedure: Coronary artery bypass grafting times four using left internal mammary artery and endoscopically harvested right saphenous vein graft;  Surgeon: Gaye Pollack, MD;  Location: [facility] OR;  Service: Open Heart Surgery;  Laterality: N/A;  . Transthoracic echocardiogram  11-14-2012    grade I diastolic dysfunction/  ef 55-60%/  trivial MR  and TR  . Cardiovascular stress test  08-09-2009  dr berry    mild to moderate perfusion defect in basal , mid, and apical inferior regions consistent with an infart/scar/   No evidence ischemia/  ef 51%/  no ECG changes/   low risk scan  . Transurethral resection  of bladder tumor N/A 04/07/2014    Procedure: TRANSURETHRAL RESECTION OF BLADDER TUMOR (TURBT);  Surgeon: Arvil Persons, MD;  Location: [facility];  Service: Urology;  Laterality: N/A;  . Cystoscopy/retrograde/ureteroscopy Right 04/07/2014    Procedure: CYSTOSCOPY/RETROGRADE/URETEROSCOPY WITH BIOPSY OF RENAL PELVIS TUMOR;  Surgeon: Arvil Persons, MD;  [facility];  Service: Urology;  Laterality: Right;  . Robot assisted laparoscopic nephrectomy Right 06/05/2014    Procedure: ROBOTIC ASSISTED LAPAROSCOPIC RIGHT NEPHROURETERECTOMY, VENTRAL HERNIA REPAIR ;  Surgeon: Ardis Hughs, MD;  Location: [facility];  Service: Urology;  Laterality: Right;  . Cystoscopy w/ ureteral stent placement Right 06/05/2014    Procedure: CYSTOSCOPY ;  Surgeon: Ardis Hughs, MD;  Location: [facility];  Service: Urology;  Laterality: Right;  . Radiology with anesthesia N/A 06/22/2014    Procedure: MRI BRAIN W/O CONTRAST;  Surgeon: Medication Radiologist, MD;  Location: [facility];  Service: Radiology;  Laterality: N/A;    Allergies: Lorazepam and Statins  Medications: Prior to Admission medications   Medication Sig Start Date End Date Taking? Authorizing Provider  aspirin 81 MG chewable tablet Chew 1 tablet (81 mg total) by mouth daily. 08/20/14  Yes Lezlie Octave Black, NP  donepezil (ARICEPT) 10 MG tablet Take 10 mg by mouth at bedtime.   Yes Historical Provider, MD  escitalopram (LEXAPRO) 20 MG tablet Take 20 mg by mouth daily.   Yes Historical Provider, MD  feeding supplement, ENSURE ENLIVE, (ENSURE ENLIVE) LIQD Take 237 mLs by mouth 3 (three) times daily between meals. 08/20/14  Yes Lezlie Octave Black, NP  fludrocortisone (FLORINEF) 0.1 MG tablet Take 0.1 mg by mouth 2 (two) times daily.   Yes Historical Provider, MD  furosemide (LASIX) 80 MG tablet Take 1 tablet (80 mg total) by mouth daily. 09/02/14  Yes Imogene Burn, PA-C  levothyroxine (SYNTHROID, LEVOTHROID) 125 MCG tablet Take 125 mcg  by mouth daily before breakfast.   Yes Historical Provider, MD  midodrine (PROAMATINE) 10 MG tablet Take 10 mg by mouth 2 (two) times daily.   Yes Historical Provider, MD  pantoprazole (PROTONIX) 40 MG tablet TAKE ONE TABLET EVERY MORNING 04/06/14  Yes Butch Penny, NP  polyethylene glycol (MIRALAX / GLYCOLAX) packet Take 17 g by mouth at bedtime.   Yes Historical Provider, MD  rivaroxaban (XARELTO) 20 MG TABS tablet Take 20 mg by mouth every morning. 0800   Yes Historical Provider, MD  ziprasidone (GEODON) 20 MG capsule Take 1 capsule (20 mg total) by mouth 2 (two) times daily with a meal. 06/22/14  Yes Maryann Mikhail, DO  Rivaroxaban (XARELTO STARTER PACK) 15 & 20 MG TBPK Take as directed on package: Start with one 15mg  tablet by mouth twice a day with food. On Day 22,  switch to one 20mg  tablet once a day with food. Patient not taking: Reported on 09/15/2014 08/20/14   Radene Gunning, NP    Family History  Problem Relation Age of Onset  . Cancer - Lung Brother     History   Social History  . Marital Status: Divorced    Spouse Name: N/A  . Number of Children: 3  . Years of Education: HS   Occupational History  .     Social History Main Topics  . Smoking status: Former Smoker -- 1.00 packs/day for 4 years    Types: Cigarettes    Start date: 09/08/1952    Quit date: 09/09/1959  . Smokeless tobacco: Former Systems developer    Types: [place] date: 01/03/1960  . Alcohol Use: No  . Drug Use: No  . Sexual Activity: Yes    Birth Control/ Protection: None   Other Topics Concern  . Not on file   Social History Narrative   Patient is single and lives alone.   Patient is retired.   Patient has a high school education.   Patient is right-handed.   Patient drinks one cup of coffee and one cup of soda or tea daily.   Patient has three adult children.      Review of Systems    HENT:       Hard of hearing  Respiratory: Negative for cough.        Some dyspnea with exertion    Cardiovascular: Negative for chest pain.  Gastrointestinal: Negative for nausea, vomiting, abdominal pain and blood in stool.  Genitourinary: Positive for hematuria. Negative for dysuria.  Musculoskeletal: Negative for back pain.    Vital Signs: BP 102/62 mmHg  Pulse 55  Temp(Src) 98.5 F (36.9 C) (Oral)  Resp 16  Ht 5' 10.5" (1.791 m)  Wt 219 lb (99.338 kg)  BMI 30.97 kg/m2  SpO2 100%  Physical Exam  Constitutional: He is oriented to person, place, and time. He appears well-developed and well-nourished.  Cardiovascular: Regular rhythm.    Sl bradycardic  Pulmonary/Chest: Effort normal and breath sounds normal.  Abdominal: Soft. Bowel sounds are normal. There is no tenderness.  Musculoskeletal: Normal range of motion.  Trace edema bilaterally  Neurological: He is alert and oriented to person, place, and time.    Imaging: Dg Ankle Complete Left  09/14/2014   CLINICAL DATA:  Three days of left foot and ankle pain, inability to bear weight, no known injury.  EXAM: LEFT ANKLE COMPLETE - 3+ VIEW  COMPARISON:  Left foot series of today's date  FINDINGS: The ankle joint mortise is preserved. The talar dome is intact. There small amount of soft tissue swelling anteriorly and medially. The talus and calcaneus exhibit no acute abnormalities. There are plantar and Achilles region calcaneal spurs.  IMPRESSION: There is no acute or significant chronic bony abnormality of the left ankle. There is mild soft tissue swelling.   Electronically Signed   By: David  Martinique M.D.   On: 09/14/2014 12:53   Ct Angio Chest Pe W/cm &/or Wo Cm  09/11/2014   CLINICAL DATA:  Right lower extremity deep venous thrombosis. History of urinary bladder neoplasm  EXAM: CT ANGIOGRAPHY CHEST WITH CONTRAST  TECHNIQUE: Multidetector CT imaging of the chest was performed using the standard protocol during bolus administration of intravenous contrast. Multiplanar CT image reconstructions and MIPs were obtained to evaluate the  vascular anatomy.  CONTRAST:  61mL OMNIPAQUE IOHEXOL 350 MG/ML SOLN  COMPARISON:  Chest radiograph Aug 17, 2014  FINDINGS: There is no demonstrable pulmonary embolus. There is atherosclerotic change in aorta. There is no thoracic aortic aneurysm.  There are scattered small bullae in both upper lobes.  There are bilateral pleural effusions. There is patchy infiltrate in the inferior lingula as well as in the anterior segment of the left upper lobe. There is mild bibasilar lung atelectasis.  On axial slice 36 series 7, there is a nodular opacity in the inferior aspect of the anterior segment of the right upper lobe measuring 7 mm.  Visualized thyroid is unremarkable. There are multiple small mediastinal lymph nodes but no adenopathy. The patient is status post coronary artery bypass grafting. Pericardium is not thickened. Heart is mildly enlarged. There is contrast refluxing into the inferior vena cava and hepatic vein suggesting a degree of right heart strain/failure.  No focal lesions are identified in the visualized upper abdomen.  There is degenerative change in the thoracic spine. There are no blastic or lytic bone lesions.  Review of the MIP images confirms the above findings.  IMPRESSION: No demonstrable pulmonary embolus.  Cardiomegaly with bilateral pleural effusions. There may be a degree of congestive heart failure. There is no frank edema, however. Reflux of contrast into the hepatic veins and inferior vena cava suggests a degree of right heart strain/failure.  Areas of alveolar opacity on the left appear more consistent with pneumonia than edema.  7 mm nodular opacity right upper lobe. Followup of this nodular opacity should be based on Fleischner Society guidelines. If the patient is at high risk for bronchogenic carcinoma, follow-up chest CT at 3-9months is recommended. If the patient is at low risk for bronchogenic carcinoma, follow-up chest CT at 6-12 months is recommended. This recommendation follows  the consensus statement: Guidelines for Management of Small Pulmonary Nodules Detected on CT Scans: A Statement from the [place] as published in Radiology 2005; 237:395-400.   Electronically Signed   By: Lowella Grip III M.D.   On: 09/11/2014 14:15   US Venous Img Lower Unilateral Right  08/18/2014   CLINICAL DATA:  Right lower extremity swelling. Post fall (07/2014). History of renal cell carcinoma. Former smoker. Evaluate for DVT.  EXAM: RIGHT LOWER EXTREMITY VENOUS DOPPLER ULTRASOUND  TECHNIQUE: Gray-scale sonography with graded compression, as well as color Doppler and duplex ultrasound were performed to evaluate the lower extremity deep venous systems from the level of the common femoral vein and including the common femoral, femoral, profunda femoral, popliteal and calf veins including the posterior tibial, peroneal and gastrocnemius veins when visible. The superficial great saphenous vein was also interrogated. Spectral Doppler was utilized to evaluate flow at rest and with distal augmentation maneuvers in the common femoral, femoral and popliteal veins.  COMPARISON:  None.  FINDINGS: Contralateral Common Femoral Vein: Respiratory phasicity is normal and symmetric with the symptomatic side. No evidence of thrombus. Normal compressibility.  Common Femoral Vein: No evidence of thrombus. Normal compressibility, respiratory phasicity and response to augmentation.  Saphenofemoral Junction: No evidence of thrombus. Normal compressibility and flow on color Doppler imaging.  Profunda Femoral Vein: No evidence of thrombus. Normal compressibility and flow on color Doppler imaging.  Femoral Vein: No evidence of thrombus. Normal compressibility, respiratory phasicity and response to augmentation.  Popliteal Vein: No evidence of thrombus. Normal compressibility, respiratory phasicity and response to augmentation.  Calf Veins: There is there is hypoechoic, occlusive thrombus within 1 of the paired  posterior tibial veins (representative images 29 through 36). The peroneal  vein is suboptimally visualized.  Superficial Great Saphenous Vein: No evidence of thrombus. Normal compressibility and flow on color Doppler imaging.  Venous Reflux:  None.  Other Findings:  None.  IMPRESSION: Examination is positive for DVT within one of the paired posterior tibial veins. There is no extension of this distal DVT to involve the more proximal deep venous system of the right lower extremity.   Electronically Signed   By: Sandi Mariscal M.D.   On: 08/18/2014 16:10   Dg Foot Complete Left  09/14/2014   CLINICAL DATA:  Pain in the arch of the foot extending into the ankle for the past 3 days without known injury, inability to bear weight  EXAM: LEFT FOOT - COMPLETE 3+ VIEW  COMPARISON:  Left ankle of today's date  FINDINGS: The bones are mildly osteopenic. The interphalangeal and metatarsophalangeal joint its are within the limits of normal for age. There is no acute or healing fracture of the metatarsals or phalanges. The midfoot and hindfoot exhibit no acute abnormalities. There are plantar and Achilles region calcaneal spurs. The soft tissues of the foot are unremarkable.  IMPRESSION: There is no acute bony abnormality of the left foot.  If the patient's symptoms persist and remain unexplained, MRI may be useful.   Electronically Signed   By: David  Martinique M.D.   On: 09/14/2014 12:54    Labs:  CBC:  Recent Labs  08/17/14 0900 08/18/14 0400 08/19/14 0556 08/20/14 0730  WBC 10.4 9.2 8.1 8.0  HGB 10.6* 10.5* 10.3* 11.0*  HCT 33.8* 33.1* 33.5* 35.2*  PLT 408* 388 392 389    COAGS:  Recent Labs  12/17/13 2245 12/18/13 2030  INR 1.11 1.41  APTT 77* 29    BMP:  Recent Labs  08/17/14 0900 08/18/14 0400 08/19/14 0556 08/20/14 0730 09/02/14 0852 09/08/14 0001  NA 141 140 139 138 140 143  K 4.0 3.4* 3.6 3.5 4.1 3.6  CL 108 105 98* 95* 104 99  CO2 25 27 34* 35* 26 26  GLUCOSE 124* 106* 107* 111*  144* 115*  BUN 23* 24* 24* 28* 26* 21  CALCIUM 9.2 8.8* 9.2 9.2 9.6 8.9  CREATININE 2.03* 1.95* 2.03* 1.83* 1.92* 1.87*  GFRNONAA 30* 31* 30* 34*  --   --   GFRAA 34* 36* 34* 39*  --   --     LIVER FUNCTION TESTS:  Recent Labs  06/13/14 0650 06/14/14 1105 06/15/14 0550 08/17/14 0900  BILITOT 0.7 0.5 0.5 0.5  AST 35 38* 35 29  ALT 16 18 18  40  ALKPHOS 53 49 51 88  PROT 6.2 5.9* 5.6* 6.6  ALBUMIN 3.2* 3.1* 2.7* 3.0*    TUMOR MARKERS: No results for input(s): AFPTM, CEA, CA199, CHROMGRNA in the last 8760 hours.  Assessment and Plan: YUMA PACELLA is a 81 y.o. male with history of CAD/heart failure with prior CABG, OSA, right lower extremity DVT on Xarelto, chronic kidney disease, prior right nephrectomy and TURBT for low-grade papillary urothelial carcinoma. He's recently began to have hematuria and was also found to have recurrent bladder tumors that need resection. He presents today for IVC filter placement prior to further surgical intervention.Risks and benefits discussed with the patient/family including, but not limited to bleeding, infection, contrast induced renal failure, filter fracture or migration which can lead to emergency surgery or even death, strut penetration with damage or irritation to adjacent structures and caval thrombosis. All of the patient's questions were answered, patient is agreeable to proceed.Consent  signed and in chart. Patient's potassium is 2.9 today. We'll give 40 mEq by mouth K Dur and have patient follow up with primary care.       Signed: D. Rowe Robert 09/17/2014, 11:19 AM   I spent a total of 20 minutes face to face in clinical consultation, greater than 50% of which was counseling/coordinating care for IVC filter placement

## 2014-09-17 NOTE — Progress Notes (Signed)
Before d/c pt used restroom and had bloody urine.  Previously noted by P.A. Lennette Bihari that pt was having hematuria prior to today's procedure, as pt has bladder tumors. Family member states this is not new but it seemed a little bloodier this time.  Encouraged family that if the urine remains this way or becomes more bloody to inform the pt's urologist.  Family voiced understanding.

## 2014-09-17 NOTE — Discharge Instructions (Signed)
Inferior Vena Cava Filter Insertion Insertion of an inferior vena cava (IVC) filter is a procedure in which a filter is placed into the large vein in your abdomen that carries blood from the lower part of your body to your heart (inferior vena cava). Placement of the filter here helps prevent blood clots in the legs or pelvis from traveling to your lungs. A large blood clot in the lungs can cause death.  The filter is a small, metal device about an inch long. It is shaped like the spokes of an umbrella and is inserted through a pathway created in your neck or groin. The risks of this procedure are usually small and easily managed. Inferior vena cava filters are only used when blood thinners (anticoagulants) cannot be used to prevent blood clots from forming. This may occur because of:  You have severe platelet problems or shortage.  You have had recent or current major bleeding that cannot be treated.  You have bleeding associated with anticoagulants.  You have recurrence of blood clots while on anticoagulants.  You have a need for surgery in the near future.  You have bleeding in your head. EXPECTATIONS OF A FILTER  An IVC filter will reduce the risk of a large blood clot making its way to your lungs (pulmonary embolus, or PE). It cannot eliminate the risk completely, or prevent small PEs from occurring.  It does not stop blood clots from growing, recurring, or developing into postphlebitic syndrome. This is a condition that can occur after there is inflammation in a vein (phlebitis). LET [facility] CARE PROVIDER KNOW ABOUT:  Any allergies you have. This includes an allergy to iodine or contrast dye.  All medicines you are taking, including vitamins, herbs, eye drops, creams and over-the-counter medicines.  Previous problems you or members of your family have had with the use of anesthetics.  Any blood disorders you have.  Previous surgeries you had had.  Medical conditions you  have.  Possibility of pregnancy, if this applies. RISKS AND COMPLICATIONS Generally, this is a safe procedure. However, as with any procedure, problems can occur. Possible problems include:  A small bruise around the needle insertion site. A larger pooling of blood called a hematoma may form. This is usually of no concern.  The filter can block the vena cava. This can cause some swelling of the legs.  The filter may eventually fail and not work properly.  Continued bleeding or infections (uncommon).  Damage to the vein by the catheter (rare). BEFORE THE PROCEDURE  Your health care provider may want you to have blood tests. These tests can help tell how well your kidneys and liver are working. They can also show how well your blood clots.  If you take blood thinners, ask your health care provider if and when you should stop taking them.  Do not eat or drink for 4 hours before the procedure or as directed by your health care provider.  Make arrangements for someone to drive you home. Depending on the procedure, you may be able to go home the same day.  PROCEDURE   Insertion of a Vena Cava filter is performed by a vascular surgeon or cardiologist in a cardiac catheterization lab.  The procedure usually takes about 30 minutes to 1 hour. This can vary.  An IV needle will be inserted into one of your veins. Medicine will be able to flow directly into your body through this needle.  Medicines may given to help you relax  and relieve anxiety (sedative).  The procedure is done through a large vein either in your neck or groin. The skin around this area is cleaned and shaved, as necessary.  The skin and deeper tissues over the vein will be made numb with a local anesthetic. You are awake during the procedure and can let your health care providers know if you have discomfort.  A needle is then put into the vein. A guide wire is placed through the needle and into the vein. This is used to  help insert a catheter and the IVC filter into your vein.  Contrast dye may be injected into the inferior vena cava to help guide the catheter and verify precise placement of the IVC filter. X-ray equipment may also be used to verify that the catheter and the wire are in the correct position.  The wire is then withdrawn.  The IVC filter is then passed over the catheter into the vein, and inserted into the correct location in the vena cava.  The catheter is then removed. Pressure will be kept on the needle insertion point for several minutes or until it is unlikely to bleed. AFTER THE PROCEDURE  You will stay in a recovery area until any sedation medicine you were given has worn off. Your blood pressure and pulse will be checked.  If there are no problems, you should be able to go home after the procedure.  You may feel sore at the area of the needle insertion for a few days. Document Released: 05/17/2005 Document Revised: 04/01/2013 Document Reviewed: 12/02/2012 [facility] Patient Information 2015 [place], [place]. This information is not intended to replace advice given to you by your health care provider. Make sure you discuss any questions you have with your health care provider. Venogram A venogram, or venography, is a procedure to look at the veins using X-ray and dye (contrast). Contrast helps the veins show up on X-rays. A venography evaluates vein abnormalities, identifies clots within veins such as deep vein thrombosis (DVT), and evaluates swelling in an arm or leg. LET [facility] CARE PROVIDER KNOW ABOUT:   Any allergies you have, especially to medicines, shellfish, iodine, and contrast.  All medicines you are taking, including vitamins, herbs, eye drops, creams, and over-the-counter medicines.  Any previous complications from this or other procedures.  Any smoking history.  Any possibility of pregnancy.  Any blood disorders you have.  Previous surgeries you have  had.  Medical conditions you have. RISKS AND COMPLICATIONS  Generally, this is a safe procedure. However, as with any procedure, problems can occur. Possible problems include:  Blood clots.  Kidney problems.  Allergic reaction to the contrast.  Bleeding.  Infection.  X-ray exposure. BEFORE THE PROCEDURE   Ask your health care provider about changing or stopping your regular medicines. This is especially important if you are taking diabetes medicines or blood thinners.  Your health care provider may want you to have blood tests. These tests can help tell how well your kidneys and liver are working. They can also show how well your blood clots.   Do not eat or drink anything after midnight on the night before the procedure or as directed by your health care provider.  A blood sample may be drawn.  An IV tube will be placed into a vein in your arm or leg.  You will be asked to remove your clothing and put on a hospital gown.  You may be asked to remove your watch or jewelry.  Arrange for someone to drive you home after the procedure. If you receive pain medicine or sedatives during the procedure, it will be unsafe for you to drive for a few hours after the procedure. PROCEDURE   You will have an IV tube placed in a vein. This is where your health care provider will inject the dye. You may also be given medicine through the IV tube to help you relax (sedation).  During the exam, you will lie on an X-ray table. The table may be tilted in different directions during the procedure to help the dye move throughout your body. Safety straps will keep you secure if the table is tilted.  If the veins to be evaluated are in your arm or leg, a band may be wrapped around that arm or leg to make the veins stay full of blood. You may feel like your arm or leg is going to sleep.  When the contrast is injected into your vein, you may notice a hot, flushed feeling as it moves throughout your  body. You may also notice a metallic taste in your mouth. Both of these sensations will go away after the test is complete.  You may be asked to lie in different positions or place your extremity in different positions.  At the end of the procedure, you will be given extra IV fluids to help flush the contrast material from your veins.  When the IV tube is removed, pressure will be applied to prevent bleeding. A bandage may be applied. AFTER THE PROCEDURE   You will be checked frequently after the procedure. You may feel sleepy during this time if you were given a sedation medicine during the procedure.  You may be given something to eat and drink.  You will be instructed to drink a lot of fluids for the remainder of the day and into the next day, to help flush the contrast from your body. Document Released: 03/15/2009 Document Revised: 08/11/2013 Document Reviewed: 12/02/2012 [facility] Patient Information 2015 [place], [place]. This information is not intended to replace advice given to you by your health care provider. Make sure you discuss any questions you have with your health care provider. Cons Conscious Sedation, Adult, Care After Refer to this sheet in the next few weeks. These instructions provide you with information on caring for yourself after your procedure. Your health care provider may also give you more specific instructions. Your treatment has been planned according to current medical practices, but problems sometimes occur. Call your health care provider if you have any problems or questions after your procedure. WHAT TO EXPECT AFTER THE PROCEDURE  After your procedure:  You may feel sleepy, clumsy, and have poor balance for several hours.  Vomiting may occur if you eat too soon after the procedure. HOME CARE INSTRUCTIONS  Do not participate in any activities where you could become injured for at least 24 hours. Do not:  Drive.  Swim.  Ride a bicycle.  Operate heavy  machinery.  Cook.  Use power tools.  Climb ladders.  Work from a high place.  Do not make important decisions or sign legal documents until you are improved.  If you vomit, drink water, juice, or soup when you can drink without vomiting. Make sure you have little or no nausea before eating solid foods.  Only take over-the-counter or prescription medicines for pain, discomfort, or fever as directed by your health care provider.  Make sure you and your family fully understand everything about  the medicines given to you, including what side effects may occur.  You should not drink alcohol, take sleeping pills, or take medicines that cause drowsiness for at least 24 hours.  If you smoke, do not smoke without supervision.  If you are feeling better, you may resume normal activities 24 hours after you were sedated.  Keep all appointments with your health care provider. SEEK MEDICAL CARE IF:  Your skin is pale or bluish in color.  You continue to feel nauseous or vomit.  Your pain is getting worse and is not helped by medicine.  You have bleeding or swelling.  You are still sleepy or feeling clumsy after 24 hours. SEEK IMMEDIATE MEDICAL CARE IF:  You develop a rash.  You have difficulty breathing.  You develop any type of allergic problem.  You have a fever. MAKE SURE YOU:  Understand these instructions.  Will watch your condition.  Will get help right away if you are not doing well or get worse. Document Released: 01/15/2013 Document Reviewed: 01/15/2013 [facility] Patient Information 2015 [place], [place]. This information is not intended to replace advice given to you by your health care provider. Make sure you discuss any questions you have with your health care provider. Inferior Vena Cava Filter Insertion, Care After Refer to this sheet in the next few weeks. These instructions provide you with information on caring for yourself after your procedure. Your health  care provider may also give you more specific instructions. Your treatment has been planned according to current medical practices, but problems sometimes occur. Call your health care provider if you have any problems or questions after your procedure. WHAT TO EXPECT AFTER THE PROCEDURE After your procedure, it is typical to have the following:  Mild pain in the area where the filter was inserted.  Mild bruising in the area where the filter was inserted. HOME CARE INSTRUCTIONS  You will be given medicine to control pain. Only take over-the-counter or prescription medicines for pain, fever, or discomfort as directed by your health care provider.  A bandage (dressing) has been placed over the insertion site. Follow your health care provider's instructions on how to care for it.  Keep the insertion site clean and dry.  Do not soak in a bath tub or pool until the filter insertion site has healed.  Do not drive if you are taking narcotic pain medicines. Follow your health care provider's instructions about driving.  Do not return to work or school until your health care provider says it is okay.   Keep all follow-up appointments.  SEEK IMMEDIATE MEDICAL CARE IF:  You develop swelling and discoloration or pain in the legs.  Your legs become pale and cold or blue.  You develop shortness of breath, feel faint, or pass out.  You develop chest pain, a cough, or difficulty breathing.  You cough up blood.  You develop a rash or feel you are having problems that may be a side effect of medicines.  You develop weakness, difficulty moving your arms or legs, or balance problems.  You develop problems with speech or vision. Document Released: 01/15/2013 Document Reviewed: 01/15/2013 [facility] Patient Information 2015 [place], [place]. This information is not intended to replace advice given to you by your health care provider. Make sure you discuss any questions you have with your health care  provider.

## 2014-09-17 NOTE — Procedures (Signed)
IVCgram and filter placement No complication No blood loss. See complete dictation in [facility].

## 2014-09-18 ENCOUNTER — Encounter (HOSPITAL_COMMUNITY): Payer: Self-pay

## 2014-09-18 ENCOUNTER — Observation Stay (HOSPITAL_COMMUNITY): Payer: Medicare Other

## 2014-09-18 ENCOUNTER — Emergency Department (HOSPITAL_COMMUNITY): Payer: Medicare Other

## 2014-09-18 ENCOUNTER — Observation Stay (HOSPITAL_COMMUNITY)
Admission: EM | Admit: 2014-09-18 | Discharge: 2014-09-19 | Disposition: A | Discharge status: Home / Self Care | Payer: Medicare Other | Attending: Internal Medicine | Admitting: Internal Medicine

## 2014-09-18 DIAGNOSIS — K921 Melena: Secondary | ICD-10-CM | POA: Diagnosis not present

## 2014-09-18 DIAGNOSIS — I2510 Atherosclerotic heart disease of native coronary artery without angina pectoris: Secondary | ICD-10-CM | POA: Diagnosis not present

## 2014-09-18 DIAGNOSIS — N400 Enlarged prostate without lower urinary tract symptoms: Secondary | ICD-10-CM | POA: Diagnosis not present

## 2014-09-18 DIAGNOSIS — C679 Malignant neoplasm of bladder, unspecified: Secondary | ICD-10-CM | POA: Insufficient documentation

## 2014-09-18 DIAGNOSIS — Z87891 Personal history of nicotine dependence: Secondary | ICD-10-CM | POA: Diagnosis not present

## 2014-09-18 DIAGNOSIS — I5042 Chronic combined systolic (congestive) and diastolic (congestive) heart failure: Secondary | ICD-10-CM | POA: Diagnosis not present

## 2014-09-18 DIAGNOSIS — G4733 Obstructive sleep apnea (adult) (pediatric): Secondary | ICD-10-CM | POA: Diagnosis not present

## 2014-09-18 DIAGNOSIS — I951 Orthostatic hypotension: Secondary | ICD-10-CM | POA: Insufficient documentation

## 2014-09-18 DIAGNOSIS — E1122 Type 2 diabetes mellitus with diabetic chronic kidney disease: Secondary | ICD-10-CM | POA: Diagnosis not present

## 2014-09-18 DIAGNOSIS — Z905 Acquired absence of kidney: Secondary | ICD-10-CM | POA: Insufficient documentation

## 2014-09-18 DIAGNOSIS — Z7982 Long term (current) use of aspirin: Secondary | ICD-10-CM | POA: Insufficient documentation

## 2014-09-18 DIAGNOSIS — T82838A Hemorrhage of vascular prosthetic devices, implants and grafts, initial encounter: Principal | ICD-10-CM | POA: Insufficient documentation

## 2014-09-18 DIAGNOSIS — R319 Hematuria, unspecified: Secondary | ICD-10-CM | POA: Diagnosis not present

## 2014-09-18 DIAGNOSIS — E785 Hyperlipidemia, unspecified: Secondary | ICD-10-CM | POA: Insufficient documentation

## 2014-09-18 DIAGNOSIS — K625 Hemorrhage of anus and rectum: Secondary | ICD-10-CM | POA: Insufficient documentation

## 2014-09-18 DIAGNOSIS — Z8673 Personal history of transient ischemic attack (TIA), and cerebral infarction without residual deficits: Secondary | ICD-10-CM | POA: Insufficient documentation

## 2014-09-18 DIAGNOSIS — Z951 Presence of aortocoronary bypass graft: Secondary | ICD-10-CM | POA: Insufficient documentation

## 2014-09-18 DIAGNOSIS — M1990 Unspecified osteoarthritis, unspecified site: Secondary | ICD-10-CM | POA: Insufficient documentation

## 2014-09-18 DIAGNOSIS — F0390 Unspecified dementia without behavioral disturbance: Secondary | ICD-10-CM | POA: Diagnosis not present

## 2014-09-18 DIAGNOSIS — Z86718 Personal history of other venous thrombosis and embolism: Secondary | ICD-10-CM | POA: Insufficient documentation

## 2014-09-18 DIAGNOSIS — L7602 Intraoperative hemorrhage and hematoma of skin and subcutaneous tissue complicating other procedure: Secondary | ICD-10-CM | POA: Diagnosis not present

## 2014-09-18 DIAGNOSIS — K219 Gastro-esophageal reflux disease without esophagitis: Secondary | ICD-10-CM | POA: Insufficient documentation

## 2014-09-18 DIAGNOSIS — N200 Calculus of kidney: Secondary | ICD-10-CM | POA: Diagnosis not present

## 2014-09-18 DIAGNOSIS — Z7901 Long term (current) use of anticoagulants: Secondary | ICD-10-CM | POA: Diagnosis not present

## 2014-09-18 DIAGNOSIS — R58 Hemorrhage, not elsewhere classified: Secondary | ICD-10-CM

## 2014-09-18 DIAGNOSIS — R1084 Generalized abdominal pain: Secondary | ICD-10-CM

## 2014-09-18 DIAGNOSIS — N183 Chronic kidney disease, stage 3 (moderate): Secondary | ICD-10-CM | POA: Diagnosis not present

## 2014-09-18 HISTORY — DX: Acute embolism and thrombosis of unspecified deep veins of unspecified lower extremity: I82.409

## 2014-09-18 LAB — CBC WITH DIFFERENTIAL/PLATELET
BASOS ABS: 0 10*3/uL (ref 0.0–0.1)
BASOS ABS: 0 10*3/uL (ref 0.0–0.1)
BASOS PCT: 1 % (ref 0–1)
BASOS PCT: 1 % (ref 0–1)
Basophils Absolute: 0 10*3/uL (ref 0.0–0.1)
Basophils Absolute: 0 10*3/uL (ref 0.0–0.1)
Basophils Relative: 1 % (ref 0–1)
Basophils Relative: 1 % (ref 0–1)
Eosinophils Absolute: 0.4 10*3/uL (ref 0.0–0.7)
Eosinophils Absolute: 0.5 10*3/uL (ref 0.0–0.7)
Eosinophils Absolute: 0.4 10*3/uL (ref 0.0–0.7)
Eosinophils Absolute: 0.5 10*3/uL (ref 0.0–0.7)
Eosinophils Relative: 5 % (ref 0–5)
Eosinophils Relative: 7 % — ABNORMAL HIGH (ref 0–5)
Eosinophils Relative: 6 % — ABNORMAL HIGH (ref 0–5)
Eosinophils Relative: 7 % — ABNORMAL HIGH (ref 0–5)
HCT: 31.9 % — ABNORMAL LOW (ref 39.0–52.0)
HCT: 30.3 % — ABNORMAL LOW (ref 39.0–52.0)
HCT: 31 % — ABNORMAL LOW (ref 39.0–52.0)
HCT: 30.6 % — ABNORMAL LOW (ref 39.0–52.0)
HEMOGLOBIN: 9.5 g/dL — AB (ref 13.0–17.0)
Hemoglobin: 9.9 g/dL — ABNORMAL LOW (ref 13.0–17.0)
Hemoglobin: 9.3 g/dL — ABNORMAL LOW (ref 13.0–17.0)
Hemoglobin: 9.6 g/dL — ABNORMAL LOW (ref 13.0–17.0)
LYMPHS ABS: 1.4 10*3/uL (ref 0.7–4.0)
LYMPHS ABS: 1.5 10*3/uL (ref 0.7–4.0)
LYMPHS PCT: 19 % (ref 12–46)
LYMPHS PCT: 20 % (ref 12–46)
Lymphocytes Relative: 20 % (ref 12–46)
Lymphocytes Relative: 18 % (ref 12–46)
Lymphs Abs: 1.6 10*3/uL (ref 0.7–4.0)
Lymphs Abs: 1.2 10*3/uL (ref 0.7–4.0)
MCH: 27.7 pg (ref 26.0–34.0)
MCH: 27.5 pg (ref 26.0–34.0)
MCH: 27.4 pg (ref 26.0–34.0)
MCH: 28 pg (ref 26.0–34.0)
MCHC: 31 g/dL (ref 30.0–36.0)
MCHC: 30.7 g/dL (ref 30.0–36.0)
MCHC: 30.6 g/dL (ref 30.0–36.0)
MCHC: 31.4 g/dL (ref 30.0–36.0)
MCV: 89.1 fL (ref 78.0–100.0)
MCV: 89.6 fL (ref 78.0–100.0)
MCV: 89.3 fL (ref 78.0–100.0)
MCV: 89.2 fL (ref 78.0–100.0)
MONO ABS: 0.5 10*3/uL (ref 0.1–1.0)
MONOS PCT: 7 % (ref 3–12)
Monocytes Absolute: 0.5 10*3/uL (ref 0.1–1.0)
Monocytes Absolute: 0.4 10*3/uL (ref 0.1–1.0)
Monocytes Absolute: 0.5 10*3/uL (ref 0.1–1.0)
Monocytes Relative: 6 % (ref 3–12)
Monocytes Relative: 7 % (ref 3–12)
Monocytes Relative: 7 % (ref 3–12)
NEUTROS ABS: 4.9 10*3/uL (ref 1.7–7.7)
NEUTROS PCT: 66 % (ref 43–77)
Neutro Abs: 5.5 10*3/uL (ref 1.7–7.7)
Neutro Abs: 4.9 10*3/uL (ref 1.7–7.7)
Neutro Abs: 4.5 10*3/uL (ref 1.7–7.7)
Neutrophils Relative %: 68 % (ref 43–77)
Neutrophils Relative %: 68 % (ref 43–77)
Neutrophils Relative %: 65 % (ref 43–77)
PLATELETS: 261 10*3/uL (ref 150–400)
PLATELETS: 224 10*3/uL (ref 150–400)
PLATELETS: 217 10*3/uL (ref 150–400)
PLATELETS: 234 10*3/uL (ref 150–400)
RBC: 3.58 MIL/uL — AB (ref 4.22–5.81)
RBC: 3.38 MIL/uL — AB (ref 4.22–5.81)
RBC: 3.47 MIL/uL — ABNORMAL LOW (ref 4.22–5.81)
RBC: 3.43 MIL/uL — ABNORMAL LOW (ref 4.22–5.81)
RDW: 15.4 % (ref 11.5–15.5)
RDW: 15.6 % — ABNORMAL HIGH (ref 11.5–15.5)
RDW: 15.6 % — ABNORMAL HIGH (ref 11.5–15.5)
RDW: 15.7 % — AB (ref 11.5–15.5)
WBC: 8 10*3/uL (ref 4.0–10.5)
WBC: 7.3 10*3/uL (ref 4.0–10.5)
WBC: 6.5 10*3/uL (ref 4.0–10.5)
WBC: 7.5 10*3/uL (ref 4.0–10.5)

## 2014-09-18 LAB — COMPREHENSIVE METABOLIC PANEL
ALBUMIN: 3.2 g/dL — AB (ref 3.5–5.0)
ALBUMIN: 3 g/dL — AB (ref 3.5–5.0)
ALK PHOS: 86 U/L (ref 38–126)
ALT: 7 U/L — ABNORMAL LOW (ref 17–63)
ALT: 8 U/L — ABNORMAL LOW (ref 17–63)
ANION GAP: 6 (ref 5–15)
AST: 15 U/L (ref 15–41)
AST: 13 U/L — AB (ref 15–41)
Alkaline Phosphatase: 78 U/L (ref 38–126)
Anion gap: 7 (ref 5–15)
BILIRUBIN TOTAL: 0.6 mg/dL (ref 0.3–1.2)
BUN: 25 mg/dL — ABNORMAL HIGH (ref 6–20)
BUN: 25 mg/dL — ABNORMAL HIGH (ref 6–20)
CALCIUM: 8.4 mg/dL — AB (ref 8.9–10.3)
CHLORIDE: 98 mmol/L — AB (ref 101–111)
CO2: 32 mmol/L (ref 22–32)
CO2: 33 mmol/L — AB (ref 22–32)
CREATININE: 1.8 mg/dL — AB (ref 0.61–1.24)
Calcium: 8.1 mg/dL — ABNORMAL LOW (ref 8.9–10.3)
Chloride: 99 mmol/L — ABNORMAL LOW (ref 101–111)
Creatinine, Ser: 1.83 mg/dL — ABNORMAL HIGH (ref 0.61–1.24)
GFR calc Af Amer: 39 mL/min — ABNORMAL LOW (ref >60)
GFR calc Af Amer: 40 mL/min — ABNORMAL LOW (ref >60)
GFR calc non Af Amer: 34 mL/min — ABNORMAL LOW (ref >60)
GFR, EST NON AFRICAN AMERICAN: 34 mL/min — AB (ref >60)
GLUCOSE: 117 mg/dL — AB (ref 65–99)
Glucose, Bld: 128 mg/dL — ABNORMAL HIGH (ref 65–99)
POTASSIUM: 3 mmol/L — AB (ref 3.5–5.1)
POTASSIUM: 2.8 mmol/L — AB (ref 3.5–5.1)
Sodium: 137 mmol/L (ref 135–145)
Sodium: 138 mmol/L (ref 135–145)
TOTAL PROTEIN: 6.6 g/dL (ref 6.5–8.1)
Total Bilirubin: 0.5 mg/dL (ref 0.3–1.2)
Total Protein: 6.1 g/dL — ABNORMAL LOW (ref 6.5–8.1)

## 2014-09-18 LAB — BASIC METABOLIC PANEL
Anion gap: 9 (ref 5–15)
Anion gap: 6 (ref 5–15)
BUN: 22 mg/dL — ABNORMAL HIGH (ref 6–20)
BUN: 22 mg/dL — ABNORMAL HIGH (ref 6–20)
CO2: 32 mmol/L (ref 22–32)
CO2: 32 mmol/L (ref 22–32)
CREATININE: 1.69 mg/dL — AB (ref 0.61–1.24)
CREATININE: 1.8 mg/dL — AB (ref 0.61–1.24)
Calcium: 8.3 mg/dL — ABNORMAL LOW (ref 8.9–10.3)
Calcium: 8.2 mg/dL — ABNORMAL LOW (ref 8.9–10.3)
Chloride: 96 mmol/L — ABNORMAL LOW (ref 101–111)
Chloride: 100 mmol/L — ABNORMAL LOW (ref 101–111)
GFR calc Af Amer: 43 mL/min — ABNORMAL LOW (ref >60)
GFR calc Af Amer: 40 mL/min — ABNORMAL LOW (ref >60)
GFR calc non Af Amer: 37 mL/min — ABNORMAL LOW (ref >60)
GFR, EST NON AFRICAN AMERICAN: 34 mL/min — AB (ref >60)
Glucose, Bld: 141 mg/dL — ABNORMAL HIGH (ref 65–99)
Glucose, Bld: 144 mg/dL — ABNORMAL HIGH (ref 65–99)
Potassium: 3.8 mmol/L (ref 3.5–5.1)
Potassium: 3.5 mmol/L (ref 3.5–5.1)
SODIUM: 137 mmol/L (ref 135–145)
Sodium: 138 mmol/L (ref 135–145)

## 2014-09-18 LAB — PROTIME-INR
INR: 2.2 — ABNORMAL HIGH (ref 0.00–1.49)
PROTHROMBIN TIME: 24.3 s — AB (ref 11.6–15.2)

## 2014-09-18 LAB — MRSA PCR SCREENING: MRSA by PCR: NEGATIVE

## 2014-09-18 LAB — TYPE AND SCREEN
ABO/RH(D): A NEG
Antibody Screen: NEGATIVE

## 2014-09-18 LAB — URINALYSIS, ROUTINE W REFLEX MICROSCOPIC
GLUCOSE, UA: NEGATIVE mg/dL
Ketones, ur: NEGATIVE mg/dL
Nitrite: NEGATIVE
PH: 5.5 (ref 5.0–8.0)
Protein, ur: 30 mg/dL — AB
Specific Gravity, Urine: 1.02 (ref 1.005–1.030)
Urobilinogen, UA: 1 mg/dL (ref 0.0–1.0)

## 2014-09-18 LAB — MAGNESIUM: Magnesium: 2.3 mg/dL (ref 1.7–2.4)

## 2014-09-18 LAB — TROPONIN I
Troponin I: 0.04 ng/mL — ABNORMAL HIGH (ref <0.031)
Troponin I: 0.04 ng/mL — ABNORMAL HIGH (ref <0.031)
Troponin I: 0.04 ng/mL — ABNORMAL HIGH (ref <0.031)

## 2014-09-18 LAB — POC OCCULT BLOOD, ED: Fecal Occult Bld: POSITIVE — AB

## 2014-09-18 LAB — URINE MICROSCOPIC-ADD ON

## 2014-09-18 LAB — BRAIN NATRIURETIC PEPTIDE: B Natriuretic Peptide: 2240 pg/mL — ABNORMAL HIGH (ref 0.0–100.0)

## 2014-09-18 MED ORDER — POTASSIUM CHLORIDE 20 MEQ/15ML (10%) PO SOLN
20.0000 meq | ORAL | Status: AC
  Administered 2014-09-18: 20 meq via ORAL

## 2014-09-18 MED ORDER — ASPIRIN 81 MG PO CHEW
81.0000 mg | CHEWABLE_TABLET | Freq: Every day | ORAL | Status: DC
  Administered 2014-09-18: 81 mg via ORAL
  Filled 2014-09-18: qty 1

## 2014-09-18 MED ORDER — PANTOPRAZOLE SODIUM 40 MG PO TBEC: 40.0000 mg | DELAYED_RELEASE_TABLET | Freq: Every morning | ORAL | Status: DC

## 2014-09-18 MED ORDER — POTASSIUM CHLORIDE 10 MEQ/100ML IV SOLN
10.0000 meq | INTRAVENOUS | Status: DC
  Administered 2014-09-18: 10 meq via INTRAVENOUS
  Filled 2014-09-18: qty 100

## 2014-09-18 MED ORDER — POTASSIUM CHLORIDE 20 MEQ/15ML (10%) PO SOLN
40.0000 meq | ORAL | Status: DC
  Administered 2014-09-18: 40 meq via ORAL
  Filled 2014-09-18: qty 30

## 2014-09-18 MED ORDER — POTASSIUM CHLORIDE CRYS ER 20 MEQ PO TBCR: 40.0000 meq | EXTENDED_RELEASE_TABLET | Freq: Every day | ORAL | Status: DC

## 2014-09-18 MED ORDER — ESCITALOPRAM OXALATE 10 MG PO TABS
20.0000 mg | ORAL_TABLET | Freq: Every day | ORAL | Status: DC
  Administered 2014-09-18: 20 mg via ORAL
  Filled 2014-09-18: qty 2
  Filled 2014-09-18: qty 1

## 2014-09-18 MED ORDER — FLUDROCORTISONE ACETATE 0.1 MG PO TABS
0.1000 mg | ORAL_TABLET | Freq: Two times a day (BID) | ORAL | Status: DC
  Administered 2014-09-18 (×2): 0.1 mg via ORAL
  Filled 2014-09-18 (×7): qty 1

## 2014-09-18 MED ORDER — SODIUM CHLORIDE 0.9 % IV SOLN: 250.0000 mL | INTRAVENOUS | Status: DC | PRN

## 2014-09-18 MED ORDER — SODIUM CHLORIDE 0.9 % IJ SOLN
3.0000 mL | Freq: Two times a day (BID) | INTRAMUSCULAR | Status: DC
  Administered 2014-09-18: 3 mL via INTRAVENOUS

## 2014-09-18 MED ORDER — LEVOTHYROXINE SODIUM 25 MCG PO TABS
125.0000 ug | ORAL_TABLET | Freq: Every day | ORAL | Status: DC
  Administered 2014-09-18: 125 ug via ORAL
  Filled 2014-09-18 (×2): qty 1

## 2014-09-18 MED ORDER — POTASSIUM CHLORIDE 10 MEQ/100ML IV SOLN: 10.0000 meq | INTRAVENOUS | Status: DC

## 2014-09-18 MED ORDER — SODIUM CHLORIDE 0.9 % IJ SOLN
3.0000 mL | Freq: Two times a day (BID) | INTRAMUSCULAR | Status: DC
Start: 2014-09-18 — End: 2014-09-19
  Administered 2014-09-18: 3 mL via INTRAVENOUS

## 2014-09-18 MED ORDER — POTASSIUM CHLORIDE 20 MEQ/15ML (10%) PO SOLN
40.0000 meq | ORAL | Status: AC
  Filled 2014-09-18: qty 30

## 2014-09-18 MED ORDER — POLYETHYLENE GLYCOL 3350 17 G PO PACK
17.0000 g | PACK | Freq: Every day | ORAL | Status: DC
  Administered 2014-09-18: 17 g via ORAL
  Filled 2014-09-18: qty 1

## 2014-09-18 MED ORDER — LIDOCAINE-EPINEPHRINE (PF) 1 %-1:200000 IJ SOLN
20.0000 mL | Freq: Once | INTRAMUSCULAR | Status: AC
  Administered 2014-09-18: 20 mL
  Filled 2014-09-18: qty 10

## 2014-09-18 MED ORDER — PANTOPRAZOLE SODIUM 40 MG IV SOLR
40.0000 mg | Freq: Two times a day (BID) | INTRAVENOUS | Status: DC
  Administered 2014-09-18 (×2): 40 mg via INTRAVENOUS
  Filled 2014-09-18 (×2): qty 40

## 2014-09-18 MED ORDER — SODIUM CHLORIDE 0.9 % IJ SOLN: 3.0000 mL | INTRAMUSCULAR | Status: DC | PRN

## 2014-09-18 MED ORDER — IOHEXOL 300 MG/ML  SOLN
50.0000 mL | Freq: Once | INTRAMUSCULAR | Status: AC | PRN
  Administered 2014-09-18: 50 mL via ORAL

## 2014-09-18 MED ORDER — ZIPRASIDONE HCL 20 MG PO CAPS
20.0000 mg | ORAL_CAPSULE | Freq: Two times a day (BID) | ORAL | Status: DC
  Administered 2014-09-18 (×2): 20 mg via ORAL
  Filled 2014-09-18 (×7): qty 1

## 2014-09-18 MED ORDER — DONEPEZIL HCL 5 MG PO TABS
10.0000 mg | ORAL_TABLET | Freq: Every day | ORAL | Status: DC
  Administered 2014-09-18: 10 mg via ORAL
  Filled 2014-09-18 (×2): qty 1
  Filled 2014-09-18: qty 2
  Filled 2014-09-18: qty 1

## 2014-09-18 MED ORDER — MIDODRINE HCL 5 MG PO TABS
10.0000 mg | ORAL_TABLET | Freq: Two times a day (BID) | ORAL | Status: DC
  Administered 2014-09-18 (×2): 10 mg via ORAL
  Filled 2014-09-18 (×2): qty 2

## 2014-09-18 NOTE — ED Notes (Signed)
Dr Rancour at bedside,  

## 2014-09-18 NOTE — ED Notes (Signed)
Pt had an IVC filter placed at [facility] earlier on Thursday, and per family the site has been bleeding since then.  Family also report rectal bleeding and blood in urine/

## 2014-09-18 NOTE — ED Notes (Signed)
Paged Interventional Radiologist

## 2014-09-18 NOTE — ED Provider Notes (Signed)
CSN: [id number]     Arrival date & time 09/18/14  0051 History   First MD Initiated Contact with Patient 09/18/14 0113     Chief Complaint  Patient presents with  . Rectal Bleeding  . Hematuria     (Consider location/radiation/quality/duration/timing/severity/associated sxs/prior Treatment) HPI Comments: Patient with multiple complaints. Had IVC filter placed today at [facility] and there has been some bleeding and oozing from the site. Son reports a dressing has been changed for times since 4 PM. Patient is on xarelto for a history of DVT and took the last dosed yesterday morning. He had IVC filter placement because he needs to go off of his anticoagulation for bladder surgery. He has chronic hematuria that appears worse today. Son also reports that the patient has some blood in his stool that was noticed in the toilet bowl today. Stool itself was brown. Patient denies any abdominal pain, chest pain, shortness of breath, fever. Endorses some generalized weakness. Patient is due for bladder resection surgery in July. He has hematuria chronically but is worse today. Patient also status post STE.ro .roMI in September 2015 with bypass surgery.  Patient is a 81 y.o. male presenting with hematuria. The history is provided by the patient and a relative.  Hematuria Pertinent negatives include no chest pain, no abdominal pain, no headaches and no shortness of breath.    Past Medical History  Diagnosis Date  . Chronic constipation   . GERD (gastroesophageal reflux disease)   . Arthritis   . Hyperlipidemia   . Orthostatic hypotension 11/19/2013  . Coronary artery disease     CARDIOLOGIST-  DR BERRY  --  HX  STEMI  12-17-2013  S/P  CABG X4  12-18-2013  . Mild dementia   . Type 2 diabetes mellitus   . Hypothyroidism   . History of esophageal dilatation   . History of gastritis   . History of hiatal hernia   . History of bladder cancer     hx  TCC low grade  . Prostate cancer      low-grade  . Kidney tumor     right  . BPH (benign prostatic hyperplasia)   . CKD (chronic kidney disease) stage 3, GFR 30-59 ml/min   . History of kidney stones   . ED (erectile dysfunction)   . S/P CABG x 4     12-18-2013  . Bladder tumor   . Complication of anesthesia     HARD TO WAKE  . OSA on CPAP     mild osa   per sleep study 07-11-2009- patient states does not use regularly  . DVT (deep venous thrombosis)    Past Surgical History  Procedure Laterality Date  . Upper gastrointestinal endoscopy  04/06/2010  &  01-08-2013    w/ esophageal dilatation  . Hemorrhoid surgery    . Hand surgery      right  . Wrist fracture surgery      pins placed-left  . Cystoscopy with biopsy  01/09/2012    Procedure: CYSTOSCOPY WITH BIOPSY;  Surgeon: Marissa Nestle, MD;  Location: [facility]S;  Service: Urology;  Laterality: N/A;  Cystoscopy with Multiple Bladder Biopsies  . Esophagogastroduodenoscopy (egd) with esophageal dilation N/A 01/08/2013    Procedure: ESOPHAGOGASTRODUODENOSCOPY (EGD) WITH ESOPHAGEAL DILATION;  Surgeon: Rogene Houston, MD;  Location: [facility] SUITE;  Service: Endoscopy;  Laterality: N/A;  120  . Left heart cath N/A 12/17/2013    Procedure: LEFT HEART CATH;  Surgeon: Shanon Brow  Loren Racer, MD;  Location: [facility] CATH LAB;  Service: Cardiovascular;  Laterality: N/A;  . Left heart catheterization with coronary angiogram N/A 12/17/2013    Procedure: LEFT HEART CATHETERIZATION WITH CORONARY ANGIOGRAM;  Surgeon: Leonie Man, MD;  Location: [facility] CATH LAB;  Service: Cardiovascular;  Laterality: N/A;  . Colonoscopy with esophagogastroduodenoscopy (egd)  08-15-2006    w/ esophageal dilatation  . Umbilical hernia repair  04-24-2003  . Transurethral resection of bladder tumor  06-14-2004  . Transurethral resection of prostate  10-04-2004  . Cysto/  removal bladder calculus/  resection bladder tumor  10-16-2006  . Cystostomy w/ bladder biopsy  01-09-2012  . Cardiac catheterization  12-17-2013   dr  Shanon Brow harding    severe disease RCA with thrombotic appearing subtotal occlusions/  95-99%  OM2/  diffuse disease LAD  and D1/  mild to moderate basal to mid inferior hypokinesis/  severe elevated LVEDP/  ef 45-50%  . Coronary artery bypass graft N/A 12/18/2013    Procedure: Coronary artery bypass grafting times four using left internal mammary artery and endoscopically harvested right saphenous vein graft;  Surgeon: Gaye Pollack, MD;  Location: [facility] OR;  Service: Open Heart Surgery;  Laterality: N/A;  . Transthoracic echocardiogram  11-14-2012    grade I diastolic dysfunction/  ef 55-60%/  trivial MR  and TR  . Cardiovascular stress test  08-09-2009  dr berry    mild to moderate perfusion defect in basal , mid, and apical inferior regions consistent with an infart/scar/   No evidence ischemia/  ef 51%/  no ECG changes/   low risk scan  . Transurethral resection of bladder tumor N/A 04/07/2014    Procedure: TRANSURETHRAL RESECTION OF BLADDER TUMOR (TURBT);  Surgeon: Arvil Persons, MD;  Location: [facility];  Service: Urology;  Laterality: N/A;  . Cystoscopy/retrograde/ureteroscopy Right 04/07/2014    Procedure: CYSTOSCOPY/RETROGRADE/URETEROSCOPY WITH BIOPSY OF RENAL PELVIS TUMOR;  Surgeon: Arvil Persons, MD;  Location: [facility];  Service: Urology;  Laterality: Right;  . Robot assisted laparoscopic nephrectomy Right 06/05/2014    Procedure: ROBOTIC ASSISTED LAPAROSCOPIC RIGHT NEPHROURETERECTOMY, VENTRAL HERNIA REPAIR ;  Surgeon: Ardis Hughs, MD;  Location: [facility] ORS;  Service: Urology;  Laterality: Right;  . Cystoscopy w/ ureteral stent placement Right 06/05/2014    Procedure: CYSTOSCOPY ;  Surgeon: Ardis Hughs, MD;  Location: [facility]S;  Service: Urology;  Laterality: Right;  . Radiology with anesthesia N/A 06/22/2014    Procedure: MRI BRAIN W/O CONTRAST;  Surgeon: Medication Radiologist, MD;  Location: [place];  Service: Radiology;  Laterality: N/A;   Family  History  Problem Relation Age of Onset  . Cancer - Lung Brother    History  Substance Use Topics  . Smoking status: Former Smoker -- 1.00 packs/day for 4 years    Types: Cigarettes    Start date: 09/08/1952    Quit date: 09/09/1959  . Smokeless tobacco: Former Systems developer    Types: [place] date: 01/03/1960  . Alcohol Use: No    Review of Systems  Constitutional: Positive for fatigue. Negative for activity change and appetite change.  HENT: Negative for rhinorrhea.   Eyes: Negative for visual disturbance.  Respiratory: Negative for cough, chest tightness and shortness of breath.   Cardiovascular: Negative for chest pain.  Gastrointestinal: Positive for blood in stool and hematochezia. Negative for nausea, vomiting and abdominal pain.  Genitourinary: Positive for hematuria.  Musculoskeletal: Negative for myalgias, arthralgias, neck pain and  neck stiffness.  Skin: Negative for rash.  Neurological: Negative for dizziness, weakness, light-headedness and headaches.   A complete 10 system review of systems was obtained and all systems are negative except as noted in the HPI and PMH.     Allergies  Lorazepam and Statins  Home Medications   Prior to Admission medications   Medication Sig Start Date End Date Taking? Authorizing Provider  aspirin 81 MG chewable tablet Chew 1 tablet (81 mg total) by mouth daily. 08/20/14  Yes Lezlie Octave Black, NP  donepezil (ARICEPT) 10 MG tablet Take 10 mg by mouth at bedtime.   Yes Historical Provider, MD  escitalopram (LEXAPRO) 20 MG tablet Take 20 mg by mouth daily.   Yes Historical Provider, MD  feeding supplement, ENSURE ENLIVE, (ENSURE ENLIVE) LIQD Take 237 mLs by mouth 3 (three) times daily between meals. 08/20/14  Yes Lezlie Octave Black, NP  fludrocortisone (FLORINEF) 0.1 MG tablet Take 0.1 mg by mouth 2 (two) times daily.   Yes Historical Provider, MD  furosemide (LASIX) 80 MG tablet Take 1 tablet (80 mg total) by mouth daily. 09/02/14  Yes Imogene Burn, PA-C  levothyroxine (SYNTHROID, LEVOTHROID) 125 MCG tablet Take 125 mcg by mouth daily before breakfast.   Yes Historical Provider, MD  midodrine (PROAMATINE) 10 MG tablet Take 10 mg by mouth 2 (two) times daily.   Yes Historical Provider, MD  pantoprazole (PROTONIX) 40 MG tablet TAKE ONE TABLET EVERY MORNING 04/06/14  Yes Butch Penny, NP  polyethylene glycol (MIRALAX / GLYCOLAX) packet Take 17 g by mouth at bedtime.   Yes Historical Provider, MD  rivaroxaban (XARELTO) 20 MG TABS tablet Take 20 mg by mouth every morning. 0800   Yes Historical Provider, MD  ziprasidone (GEODON) 20 MG capsule Take 1 capsule (20 mg total) by mouth 2 (two) times daily with a meal. 06/22/14  Yes Maryann Mikhail, DO  Rivaroxaban (XARELTO STARTER PACK) 15 & 20 MG TBPK Take as directed on package: Start with one 15mg  tablet by mouth twice a day with food. On Day 22, switch to one 20mg  tablet once a day with food. 08/20/14   Radene Gunning, NP   BP 110/69 mmHg  Pulse 63  Temp(Src) 98.5 F (36.9 C) (Oral)  Resp 20  SpO2 95% Physical Exam  Constitutional: He is oriented to person, place, and time. He appears well-developed and well-nourished. No distress.  Somewhat pale appearing  HENT:  Head: Normocephalic and atraumatic.  Mouth/Throat: Oropharynx is clear and moist. No oropharyngeal exudate.  Eyes: Conjunctivae and EOM are normal. Pupils are equal, round, and reactive to light.  Neck: Normal range of motion. Neck supple.  No meningismus.  right IJ puncture site with small amount of oozing after bandage removed. No hematoma.  Cardiovascular: Normal rate, regular rhythm, normal heart sounds and intact distal pulses.   No murmur heard. Pulmonary/Chest: Effort normal and breath sounds normal. No respiratory distress.  Abdominal: Soft. There is no tenderness. There is no rebound and no guarding.  Genitourinary: Guaiac positive stool.  Brown stool, no hemorrhoids, chaperone present  Musculoskeletal: Normal  range of motion. He exhibits no edema or tenderness.  Neurological: He is alert and oriented to person, place, and time. No cranial nerve deficit. He exhibits normal muscle tone. Coordination normal.  No ataxia on finger to nose bilaterally. No pronator drift. 5/5 strength throughout. CN 2-12 intact. Equal grip strength. Sensation intact.   Skin: Skin is warm.  Psychiatric: He has a normal mood  and affect. His behavior is normal.  Nursing note and vitals reviewed.   ED Course  LACERATION REPAIR Date/Time: 09/18/2014 3:08 AM Performed by: Ezequiel Essex Authorized by: Ezequiel Essex Consent: Verbal consent obtained. Risks and benefits: risks, benefits and alternatives were discussed Consent given by: patient Patient understanding: patient states understanding of the procedure being performed Patient consent: the patient's understanding of the procedure matches consent given Patient identity confirmed: provided demographic data and verbally with patient Time out: Immediately prior to procedure a "time out" was called to verify the correct patient, procedure, equipment, support staff and site/side marked as required. Body area: head/neck Location details: neck Laceration length: 1 cm Tendon involvement: none Nerve involvement: none Vascular damage: no Anesthesia: local infiltration Local anesthetic: lidocaine 1% with epinephrine Anesthetic total: 4 ml Patient sedated: no Preparation: Patient was prepped and draped in the usual sterile fashion. Irrigation solution: saline Amount of cleaning: standard Skin closure: 5-0 Prolene Number of sutures: 4 Technique: simple Approximation: close Approximation difficulty: simple Dressing: 4x4 sterile gauze and pressure dressing Patient tolerance: Patient tolerated the procedure well with no immediate complications   (including critical care time) Labs Review Labs Reviewed  CBC WITH DIFFERENTIAL/PLATELET - Abnormal; Notable for the  following:    RBC 3.58 (*)    Hemoglobin 9.9 (*)    HCT 31.9 (*)    All other components within normal limits  COMPREHENSIVE METABOLIC PANEL - Abnormal; Notable for the following:    Potassium 3.0 (*)    Chloride 98 (*)    Glucose, Bld 128 (*)    BUN 25 (*)    Creatinine, Ser 1.83 (*)    Calcium 8.4 (*)    Albumin 3.2 (*)    ALT 7 (*)    GFR calc non Af Amer 34 (*)    GFR calc Af Amer 39 (*)    All other components within normal limits  PROTIME-INR - Abnormal; Notable for the following:    Prothrombin Time 24.3 (*)    INR 2.20 (*)    All other components within normal limits  URINALYSIS, ROUTINE W REFLEX MICROSCOPIC (NOT AT [facility]) - Abnormal; Notable for the following:    Color, Urine BROWN (*)    APPearance HAZY (*)    Hgb urine dipstick LARGE (*)    Bilirubin Urine SMALL (*)    Protein, ur 30 (*)    Leukocytes, UA TRACE (*)    All other components within normal limits  BRAIN NATRIURETIC PEPTIDE - Abnormal; Notable for the following:    B Natriuretic Peptide 2240.0 (*)    All other components within normal limits  URINE MICROSCOPIC-ADD ON - Abnormal; Notable for the following:    Bacteria, UA MANY (*)    All other components within normal limits  CBC WITH DIFFERENTIAL/PLATELET - Abnormal; Notable for the following:    RBC 3.38 (*)    Hemoglobin 9.3 (*)    HCT 30.3 (*)    RDW 15.6 (*)    Eosinophils Relative 7 (*)    All other components within normal limits  POC OCCULT BLOOD, ED - Abnormal; Notable for the following:    Fecal Occult Bld POSITIVE (*)    All other components within normal limits  MRSA PCR SCREENING  COMPREHENSIVE METABOLIC PANEL  TROPONIN I  TROPONIN I  TROPONIN I  CBC WITH DIFFERENTIAL/PLATELET  CBC WITH DIFFERENTIAL/PLATELET  TYPE AND SCREEN    Imaging Review Dg Chest 2 View  09/18/2014   CLINICAL DATA:  Bleeding from  rectum and venous access site.  EXAM: CHEST  2 VIEW  COMPARISON:  08/17/2014  FINDINGS: There is mild unchanged cardiomegaly.  The lungs are clear. There are no pleural effusions. There is mild chronic right lateral base pleural thickening. Pulmonary vasculature is normal.  IMPRESSION: Mild cardiomegaly.  No acute cardiopulmonary findings.   Electronically Signed   By: Andreas Newport M.D.   On: 09/18/2014 02:15   Ir Ivc Filter Plmt / S&i /img Guid/mod Sed  09/17/2014   CLINICAL DATA:  DVT, hematuria. Needs to go up anticoagulation for urologic procedure. Caval filtration requested. Renal insufficiency.  EXAM: INFERIOR VENACAVOGRAM  IVC FILTER PLACEMENT UNDER FLUOROSCOPY  FLUOROSCOPY TIME:  24 seconds, 149 mGy  TECHNIQUE: The procedure, risks (including but not limited to bleeding, infection, organ damage ), benefits, and alternatives were explained to the patient. Questions regarding the procedure were encouraged and answered. The patient understands and consents to the procedure. Patency of the right IJ vein was confirmed with ultrasound with image documentation. An appropriate skin site was determined. Skin site was marked, prepped with chlorhexidine, and draped using maximum barrier technique. The region was infiltrated locally with 1% lidocaine.  Intravenous Fentanyl and Versed were administered as conscious sedation during continuous cardiorespiratory monitoring by the radiology RN, with a total moderate sedation time of 6 minutes.  Under real-time ultrasound guidance, the right IJ vein was accessed with a 21 gauge micropuncture needle; the needle tip within the vein was confirmed with ultrasound image documentation. The needle was exchanged over a 018 guidewire for a transitional dilator, which allow advancement of the [facility] wire into the IVC. A long 6 French vascular sheath was placed for inferior CO2 venacavography. This demonstrated no caval thrombus. Renal vein inflows were evident.  [facility] IVC filter was advanced through the sheath and successfully deployed under fluoroscopy at the L2 infrarenal level. Followup CO2  cavagram demonstrates stable filter position and no evident complication. The sheath was removed and hemostasis achieved at the site. No immediate complication.  IMPRESSION: 1. Normal IVC. No thrombus or significant anatomic variation. 2. Technically successful infrarenal IVC filter placement. This is a retrievable model.   Electronically Signed   By: Lucrezia Europe M.D.   On: 09/17/2014 15:56     EKG Interpretation   Date/Time:  Friday September 18 2014 01:39:40 EDT Ventricular Rate:  60 PR Interval:  185 QRS Duration: 114 QT Interval:  566 QTC Calculation: 566 R Axis:   97 Text Interpretation:  Sinus rhythm Borderline intraventricular conduction  delay Borderline low voltage, extremity leads RSR' in V1 or V2, probably  normal variant Borderline repolarization abnormality Prolonged QT interval  No significant change was found Confirmed by Wyvonnia Dusky  MD, Rheda Kassab [phone])  on 09/18/2014 1:46:28 AM      MDM   Final diagnoses:  Bleeding  Hematuria  Hematochezia   Bleeding from site of IVC filter placement as well as hematuria and rectal bleeding. No chest pain or shortness of breath.  Vital stable. No distress. Small amount of venous oozing in IJ puncture site. No hematoma.  Hemoglobin is stable. INR is 2.2.  Persistent bleeding from neck venipuncture site despite pressure dressing, quick clot and hemostatic dressing.  Discussed with on-call interventional radiology Dr. Reesa Chew. He agrees acceptable to suture area.  Sutures placed as above with improvement in amount of bleeding.  Labs otherwise appear to be at baseline. Patient no distress. Blood pressure 35-465 systolic. Heart rate 60s and 70s.  Patient needs anticoagulation held in setting  of multiple sites of bleeding. Apparently has xarelto was not held prior to his IVC placement procedure.  He will need admission for monitoring and trending of his hemoglobin.  D/w Dr. Ernestina Patches.   Ezequiel Essex, MD 09/18/14 [id number]

## 2014-09-18 NOTE — H&P (Addendum)
Hospitalist Admission History and Physical  Patient name: [name] [name] Medical record number: [id number] Date of birth: 16-Dec-1933 Age: 81 y.o. Gender: male  Primary Care Provider: Purvis Kilts, MD  Chief Complaint: post procedural bleeding, hemoccult positive stools, hematuria   History of Present Illness:This is a 81 y.o. year old male with significant past medical history of multiple medical problems including DVT, CAD s/p CABG, CVA, type 2 DM, renal insufficiency s/p nephrectomy, hx/o bladder cancer, combined systolic and diastolic congestive heart failure  presenting with post procedural bleeding, hemoccult positive stools, hematuria. Per family, patient went for outpatient procedure for IVC filter placement by interventional radiology earlier in the day. Has a baseline history of lower extremity DVT. Currently on Xarelto. Per family, upon arriving home, pt w/ worsening baseline hematuria as well as persistent bleeding from procedural site. Per family pt was continued on xarelto through procedure. There were no instructions for discontinuation. Pt denies any CP, SOB. Mild abd pain per grandson. Reported ? abd trauma earlier in the week.   Present to the ER afebrile, blood pressure in the 50s to 60s, respirations in the tens to 20s, blood pressure in the 90s to 100s, satting in the mid 90s on room air. Noted hemoglobin of 10, creatinine 1.8, potassium 3, INR 2.2. Chest x-ray within normal limits. Noted hematuria in urine. Hemoccult-positive.  Assessment and Plan: [name] is a 82 y.o. year old male presenting with post procedural bleeding, hemoccult positive stools, hematuria  Active Problems:   Postprocedural Bleeding   1- Post procedural Bleeding -s/p suturing of affected area by EDP at bedside -higher procedural bleeding risk given xarelto use -multiple confounders including worsening hematuria in setting of baseline bladder Ca, hemoccult + stools  -hgb appears stable  at present -HOLD anticoagulation for now  -currently at baseline-no indications for rapid correction of coagulopathy at present  2-Hemoccult Positive stools -confounding issue in setting of above  -pt/family deny any known hx/o intestinal disease or prior colonoscopies -IV PPI -HOLD anticoagulation -trend hgb  -GI c/s in am   3- Hematuria -Baseline bladder Ca -pending urological procedure w/in next 2 weeks per family (followed by [facility]) -Acute worsening in setting of above  -follow -urology consult as clinically indicated   3-Chronic combined systolic and diastolic CHF -2D ECHO 06/5668 w/ EF 14-10% and diastolic dysfunction  -euvolemic to dry on exam -may benefit from gentle hydration-will defer for now -cards c/s in am  4-CAD  -s/p CABG 12/2013 -no active CP  -cycle CEs -follow   5-DVT -s/p IVC filter placement yesterday -noted post procedural bleeding in setting of xarelto use -pending planned transition to mono tx w/ IVC filter in setting of hematuria assd w/ bladder cancer.  -HOLD anticoagulation for now -f/u cards recs  -follow closely  6-Orthostatic Hypotension  -on midodrine chronically  -BPs appear to be near baseline though LLN per family  -cont  -follow BP -may need gentle hydration as clinically indicated  7-hx/o bladder cancer -followed by urology oupt -noted hematuria -consider urology consult as clinically indicated    FEN/GI: NPO  Prophylaxis: SCDs  Disposition: pending further evaluation  Code Status:Full Code    Patient Active Problem List   Diagnosis Date Noted  . Postprocedural Bleeding 09/18/2014  . DVT (deep venous thrombosis)   . Hematuria 09/02/2014  . Pulmonary embolism suspected 08/20/2014  . Congestive heart disease   . Lower leg DVT (deep venous thrombosis)   . Protein-calorie malnutrition, severe 08/18/2014  .  CHF (congestive heart failure) 08/17/2014  . Acute renal failure superimposed on stage 3  chronic kidney disease 08/17/2014  . Acute on chronic systolic and diastolic heart failure, NYHA class 3 08/17/2014  . CKD (chronic kidney disease) stage 3, GFR 30-59 ml/min   . Palliative care encounter   . Obstructive uropathy   . Urinary tract infectious disease   . Blindness   . NSTEMI (non-ST elevated myocardial infarction)   . Renal insufficiency   . S/p nephrectomy 06/12/2014  . Acute renal failure 06/12/2014  . UTI (lower urinary tract infection) 06/12/2014  . Metabolic encephalopathy 29/47/6546  . Elevated troponin 06/12/2014  . Altered mental state 06/12/2014  . Altered mental status   . Transitional cell carcinoma of kidney 06/05/2014  . TIA (transient ischemic attack) 01/23/2014  . Dementia without behavioral disturbance 01/23/2014  . Prolonged Q-T interval on ECG 01/23/2014  . DM type 2 (diabetes mellitus, type 2) 01/23/2014  . Anemia, post op 01/13/2014  . CAD, multiple vessel 12/18/2013  . S/P CABG x 4 12/18/2013  . ST elevation myocardial infarction (STEMI) of inferior wall, initial episode of care 12/17/2013  . Hypoxemia 11/19/2013  . Orthostatic hypotension 11/19/2013  . Dysphagia, unspecified(787.20) 12/12/2012  . Dizziness 11/07/2012  . Hyperlipidemia 11/07/2012  . Obstructive sleep apnea 11/07/2012   Past Medical History: Past Medical History  Diagnosis Date  . Chronic constipation   . GERD (gastroesophageal reflux disease)   . Arthritis   . Hyperlipidemia   . Orthostatic hypotension 11/19/2013  . Coronary artery disease     CARDIOLOGIST-  DR BERRY  --  HX  STEMI  12-17-2013  S/P  CABG X4  12-18-2013  . Mild dementia   . Type 2 diabetes mellitus   . Hypothyroidism   . History of esophageal dilatation   . History of gastritis   . History of hiatal hernia   . History of bladder cancer     hx  TCC low grade  . Prostate cancer     low-grade  . Kidney tumor     right  . BPH (benign prostatic hyperplasia)   . CKD (chronic kidney disease) stage 3,  GFR 30-59 ml/min   . History of kidney stones   . ED (erectile dysfunction)   . S/P CABG x 4     12-18-2013  . Bladder tumor   . Complication of anesthesia     HARD TO WAKE  . OSA on CPAP     mild osa   per sleep study 07-11-2009- patient states does not use regularly  . DVT (deep venous thrombosis)     Past Surgical History: Past Surgical History  Procedure Laterality Date  . Upper gastrointestinal endoscopy  04/06/2010  &  01-08-2013    w/ esophageal dilatation  . Hemorrhoid surgery    . Hand surgery      right  . Wrist fracture surgery      pins placed-left  . Cystoscopy with biopsy  01/09/2012    Procedure: CYSTOSCOPY WITH BIOPSY;  Surgeon: Marissa Nestle, MD;  Location: [facility];  Service: Urology;  Laterality: N/A;  Cystoscopy with Multiple Bladder Biopsies  . Esophagogastroduodenoscopy (egd) with esophageal dilation N/A 01/08/2013    Procedure: ESOPHAGOGASTRODUODENOSCOPY (EGD) WITH ESOPHAGEAL DILATION;  Surgeon: Rogene Houston, MD;  Location: [facility];  Service: Endoscopy;  Laterality: N/A;  120  . Left heart cath N/A 12/17/2013    Procedure: LEFT HEART CATH;  Surgeon: Leonie Man, MD;  Location:  [facility];  Service: Cardiovascular;  Laterality: N/A;  . Left heart catheterization with coronary angiogram N/A 12/17/2013    Procedure: LEFT HEART CATHETERIZATION WITH CORONARY ANGIOGRAM;  Surgeon: Leonie Man, MD;  Location: [facility] CATH LAB;  Service: Cardiovascular;  Laterality: N/A;  . Colonoscopy with esophagogastroduodenoscopy (egd)  08-15-2006    w/ esophageal dilatation  . Umbilical hernia repair  04-24-2003  . Transurethral resection of bladder tumor  06-14-2004  . Transurethral resection of prostate  10-04-2004  . Cysto/  removal bladder calculus/  resection bladder tumor  10-16-2006  . Cystostomy w/ bladder biopsy  01-09-2012  . Cardiac catheterization  12-17-2013   dr Shanon Brow harding    severe disease RCA with thrombotic appearing subtotal occlusions/  95-99%   OM2/  diffuse disease LAD  and D1/  mild to moderate basal to mid inferior hypokinesis/  severe elevated LVEDP/  ef 45-50%  . Coronary artery bypass graft N/A 12/18/2013    Procedure: Coronary artery bypass grafting times four using left internal mammary artery and endoscopically harvested right saphenous vein graft;  Surgeon: Gaye Pollack, MD;  Location: [facility] OR;  Service: Open Heart Surgery;  Laterality: N/A;  . Transthoracic echocardiogram  11-14-2012    grade I diastolic dysfunction/  ef 55-60%/  trivial MR  and TR  . Cardiovascular stress test  08-09-2009  dr berry    mild to moderate perfusion defect in basal , mid, and apical inferior regions consistent with an infart/scar/   No evidence ischemia/  ef 51%/  no ECG changes/   low risk scan  . Transurethral resection of bladder tumor N/A 04/07/2014    Procedure: TRANSURETHRAL RESECTION OF BLADDER TUMOR (TURBT);  Surgeon: Arvil Persons, MD;  [facility];  Service: Urology;  Laterality: N/A;  . Cystoscopy/retrograde/ureteroscopy Right 04/07/2014    Procedure: CYSTOSCOPY/RETROGRADE/URETEROSCOPY WITH BIOPSY OF RENAL PELVIS TUMOR;  Surgeon: Arvil Persons, MD;  [facility];  Service: Urology;  Laterality: Right;  . Robot assisted laparoscopic nephrectomy Right 06/05/2014    Procedure: ROBOTIC ASSISTED LAPAROSCOPIC RIGHT NEPHROURETERECTOMY, VENTRAL HERNIA REPAIR ;  Surgeon: Ardis Hughs, MD;  Location: [facility] ORS;  Service: Urology;  Laterality: Right;  . Cystoscopy w/ ureteral stent placement Right 06/05/2014    Procedure: CYSTOSCOPY ;  Surgeon: Ardis Hughs, MD;  Location: [facility] ORS;  Service: Urology;  Laterality: Right;  . Radiology with anesthesia N/A 06/22/2014    Procedure: MRI BRAIN W/O CONTRAST;  Surgeon: Medication Radiologist, MD;  Location: [place];  Service: Radiology;  Laterality: N/A;    Social History: History   Social History  . Marital Status: Divorced    Spouse Name: N/A  . Number  of Children: 3  . Years of Education: HS   Occupational History  .     Social History Main Topics  . Smoking status: Former Smoker -- 1.00 packs/day for 4 years    Types: Cigarettes    Start date: 09/08/1952    Quit date: 09/09/1959  . Smokeless tobacco: Former Systems developer    Types: [place] date: 01/03/1960  . Alcohol Use: No  . Drug Use: No  . Sexual Activity: Yes    Birth Control/ Protection: None   Other Topics Concern  . None   Social History Narrative   Patient is single and lives alone.   Patient is retired.   Patient has a high school education.   Patient is right-handed.   Patient drinks  one cup of coffee and one cup of soda or tea daily.   Patient has three adult children.    Family History: Family History  Problem Relation Age of Onset  . Cancer - Lung Brother     Allergies: Allergies  Allergen Reactions  . Lorazepam     Hallucinations, worsens agitation  . Statins Other (See Comments)    MUSCLE ACHES    Current Facility-Administered Medications  Medication Dose Route Frequency Provider Last Rate Last Dose  . 0.9 %  sodium chloride infusion  250 mL Intravenous PRN Deneise Lever, MD      . aspirin chewable tablet 81 mg  81 mg Oral Daily Deneise Lever, MD      . donepezil (ARICEPT) tablet 10 mg  10 mg Oral QHS Deneise Lever, MD      . escitalopram (LEXAPRO) tablet 20 mg  20 mg Oral Daily Deneise Lever, MD      . fludrocortisone (FLORINEF) tablet 0.1 mg  0.1 mg Oral BID Deneise Lever, MD      . levothyroxine (SYNTHROID, LEVOTHROID) tablet 125 mcg  125 mcg Oral QAC breakfast Deneise Lever, MD      . midodrine (PROAMATINE) tablet 10 mg  10 mg Oral BID Deneise Lever, MD      . pantoprazole (PROTONIX) injection 40 mg  40 mg Intravenous Q12H Deneise Lever, MD      . polyethylene glycol (MIRALAX / GLYCOLAX) packet 17 g  17 g Oral QHS Deneise Lever, MD      . sodium chloride 0.9 % injection 3 mL  3 mL Intravenous Q12H Deneise Lever, MD       . sodium chloride 0.9 % injection 3 mL  3 mL Intravenous Q12H Deneise Lever, MD      . sodium chloride 0.9 % injection 3 mL  3 mL Intravenous PRN Deneise Lever, MD      . ziprasidone (GEODON) capsule 20 mg  20 mg Oral BID WC Deneise Lever, MD       Current Outpatient Prescriptions  Medication Sig Dispense Refill  . aspirin 81 MG chewable tablet Chew 1 tablet (81 mg total) by mouth daily.    [name] donepezil (ARICEPT) 10 MG tablet Take 10 mg by mouth at bedtime.    [name] escitalopram (LEXAPRO) 20 MG tablet Take 20 mg by mouth daily.    . feeding supplement, ENSURE ENLIVE, (ENSURE ENLIVE) LIQD Take 237 mLs by mouth 3 (three) times daily between meals. 237 mL 12  . fludrocortisone (FLORINEF) 0.1 MG tablet Take 0.1 mg by mouth 2 (two) times daily.    . furosemide (LASIX) 80 MG tablet Take 1 tablet (80 mg total) by mouth daily. 30 tablet 6  . levothyroxine (SYNTHROID, LEVOTHROID) 125 MCG tablet Take 125 mcg by mouth daily before breakfast.    . midodrine (PROAMATINE) 10 MG tablet Take 10 mg by mouth 2 (two) times daily.    . pantoprazole (PROTONIX) 40 MG tablet TAKE ONE TABLET EVERY MORNING 30 tablet 4  . polyethylene glycol (MIRALAX / GLYCOLAX) packet Take 17 g by mouth at bedtime.    . rivaroxaban (XARELTO) 20 MG TABS tablet Take 20 mg by mouth every morning. 0800    . ziprasidone (GEODON) 20 MG capsule Take 1 capsule (20 mg total) by mouth 2 (two) times daily with a meal. 60 capsule 0  . Rivaroxaban (XARELTO STARTER PACK) 15 & 20 MG TBPK Take  as directed on package: Start with one 15mg  tablet by mouth twice a day with food. On Day 22, switch to one 20mg  tablet once a day with food. 51 each 0   Review Of Systems: Complete ROS negative except as noted above in HPI.  Physical Exam: Filed Vitals:   09/18/14 0347  BP: 100/62  Pulse: 60  Temp:   Resp: 20    General: alert and cooperative HEENT: PERRLA and extra ocular movement intact Heart: S1, S2 normal, no murmur, rub or gallop, regular  rate and rhythm Lungs: clear to auscultation, no wheezes or rales and unlabored breathing Abdomen: + bowel sounds, + mild generalized TTP Extremities: extremities normal, atraumatic, no cyanosis or edema Skin:no rashes Neurology: normal without focal findings Psych: mood stable    Labs and Imaging: Lab Results  Component Value Date/Time   NA 137 09/18/2014 01:30 AM   NA 138 03/01/2010   K 3.0* 09/18/2014 01:30 AM   CL 98* 09/18/2014 01:30 AM   CO2 32 09/18/2014 01:30 AM   BUN 25* 09/18/2014 01:30 AM   CREATININE 1.83* 09/18/2014 01:30 AM   CREATININE 1.87* 09/08/2014 12:01 AM   GLUCOSE 128* 09/18/2014 01:30 AM   Lab Results  Component Value Date   WBC 8.0 09/18/2014   HGB 9.9* 09/18/2014   HCT 31.9* 09/18/2014   MCV 89.1 09/18/2014   PLT 261 09/18/2014   Urinalysis    Component Value Date/Time   COLORURINE BROWN* 09/18/2014 0230   APPEARANCEUR HAZY* 09/18/2014 0230   LABSPEC 1.020 09/18/2014 0230   PHURINE 5.5 09/18/2014 0230   GLUCOSEU NEGATIVE 09/18/2014 0230   HGBUR LARGE* 09/18/2014 0230   BILIRUBINUR SMALL* 09/18/2014 0230   KETONESUR NEGATIVE 09/18/2014 0230   PROTEINUR 30* 09/18/2014 0230   UROBILINOGEN 1.0 09/18/2014 0230   NITRITE NEGATIVE 09/18/2014 0230   LEUKOCYTESUR TRACE* 09/18/2014 0230       Dg Chest 2 View  09/18/2014   CLINICAL DATA:  Bleeding from rectum and venous access site.  EXAM: CHEST  2 VIEW  COMPARISON:  08/17/2014  FINDINGS: There is mild unchanged cardiomegaly. The lungs are clear. There are no pleural effusions. There is mild chronic right lateral base pleural thickening. Pulmonary vasculature is normal.  IMPRESSION: Mild cardiomegaly.  No acute cardiopulmonary findings.   Electronically Signed   By: Andreas Newport M.D.   On: 09/18/2014 02:15   Ir Ivc Filter Plmt / S&i /img Guid/mod Sed  09/17/2014   CLINICAL DATA:  DVT, hematuria. Needs to go up anticoagulation for urologic procedure. Caval filtration requested. Renal  insufficiency.  EXAM: INFERIOR VENACAVOGRAM  IVC FILTER PLACEMENT UNDER FLUOROSCOPY  FLUOROSCOPY TIME:  24 seconds, 149 mGy  TECHNIQUE: The procedure, risks (including but not limited to bleeding, infection, organ damage ), benefits, and alternatives were explained to the patient. Questions regarding the procedure were encouraged and answered. The patient understands and consents to the procedure. Patency of the right IJ vein was confirmed with ultrasound with image documentation. An appropriate skin site was determined. Skin site was marked, prepped with chlorhexidine, and draped using maximum barrier technique. The region was infiltrated locally with 1% lidocaine.  Intravenous Fentanyl and Versed were administered as conscious sedation during continuous cardiorespiratory monitoring by the radiology RN, with a total moderate sedation time of 6 minutes.  Under real-time ultrasound guidance, the right IJ vein was accessed with a 21 gauge micropuncture needle; the needle tip within the vein was confirmed with ultrasound image documentation. The needle was exchanged over a  018 guidewire for a transitional dilator, which allow advancement of the [facility] wire into the IVC. A long 6 French vascular sheath was placed for inferior CO2 venacavography. This demonstrated no caval thrombus. Renal vein inflows were evident.  The [facility] IVC filter was advanced through the sheath and successfully deployed under fluoroscopy at the L2 infrarenal level. Followup CO2 cavagram demonstrates stable filter position and no evident complication. The sheath was removed and hemostasis achieved at the site. No immediate complication.  IMPRESSION: 1. Normal IVC. No thrombus or significant anatomic variation. 2. Technically successful infrarenal IVC filter placement. This is a retrievable model.   Electronically Signed   By: Lucrezia Europe M.D.   On: 09/17/2014 15:56        Greater than 50% of greater than 70 minutes spent with patient in  terms of direct patient care and/or care coordination.   Imaging studies and EKG readings personally reviewed    Shanda Howells MD  Pager: [phone]

## 2014-09-18 NOTE — ED Notes (Signed)
Dr Ernestina Patches at bedside,

## 2014-09-18 NOTE — ED Notes (Signed)
Report given to floor,  

## 2014-09-18 NOTE — Care Management Note (Signed)
Case Management Note  Patient Details  Name: [name] [name] MRN: [id number] Date of Birth: 04-06-1934  Expected Discharge Date:                  Expected Discharge Plan:  Home/Self Care  In-House Referral:  NA  Discharge planning Services  CM Consult  Post Acute Care Choice:  NA Choice offered to:  NA  DME Arranged:    DME Agency:     HH Arranged:    HH Agency:     Status of Service:  Completed, signed off  Medicare Important Message Given:    Date Medicare IM Given:    Medicare IM give by:    Date Additional Medicare IM Given:    Additional Medicare Important Message give by:     If discussed at [place] of Stay Meetings, dates discussed:    Additional Comments: Pt is from home, lives alone and has 24/7 PD care givers. Pt's very supportive and in room during assessment. Pt has no formal [employer] services prior to admission but has used [facility] in the past and would like them again if needed. Pt has cane, walker, wheelchair if needed. Pt has no CM needs at this time, none anticipated.  Sherald Barge, RN 09/18/2014, 9:26 AM

## 2014-09-18 NOTE — ED Notes (Signed)
Pt and family updated on plan of care  

## 2014-09-18 NOTE — ED Notes (Signed)
Pt presents to er with family for further evaluation of blood in urine, blood in stool, and bleeding from surgical site, pt had ivc filter placed today at [facility], family reports that they have changed his dressing 3 times since 5pm, dressing removed from right neck area by Dr Wyvonnia Dusky, small amount of blood oozing from surgical site noted, new dressing applied, pt alert, able to answer questions, has difficulty hearing at times, family reports that the blood in his urine and stool is not new for pt, has hx of bladder cancer,

## 2014-09-18 NOTE — Progress Notes (Signed)
FOLLOW UP FROM EARLIER ADMISSION TODAY: 81 yo man with hx of bladder cancer and hematuria, hx of DVT on Xarelto, s/p IVCF placement yesterday, anticipating further bladder procedure to be done at [facility], hx of CAD s/p CABGx4 9 mo ago, admitted for more hematuria, bleeding from the right upper chest at the insertion of the IVCF, and OB positive stool.  He was hypotensive, but now normotensive, and Xarelto had been d/c. Will keep him in the ICU today, follow Hct every 8 hours, and transfuse as necessary.  Will not do bladder irrigation avoiding trauma and further bleeding.  K is very low, will replete.  Explained GOC, risks, and benefits to him, 2 sons, and daughter in law at the bedside.

## 2014-09-18 NOTE — Progress Notes (Signed)
Patient unable to tolerate iv potassium. Contacted MD and received an order to give potassium 88mEq for 2 doses then put in order for BMET 1 hour after last dose. Also ordered a magnesium level.

## 2014-09-18 NOTE — ED Notes (Signed)
Dr Wyvonnia Dusky aware of orthostatic vitals

## 2014-09-19 DIAGNOSIS — R58 Hemorrhage, not elsewhere classified: Secondary | ICD-10-CM | POA: Diagnosis not present

## 2014-09-19 LAB — COMPREHENSIVE METABOLIC PANEL
ALT: 9 U/L — AB (ref 17–63)
AST: 16 U/L (ref 15–41)
Albumin: 3 g/dL — ABNORMAL LOW (ref 3.5–5.0)
Alkaline Phosphatase: 77 U/L (ref 38–126)
Anion gap: 6 (ref 5–15)
BUN: 21 mg/dL — ABNORMAL HIGH (ref 6–20)
CO2: 30 mmol/L (ref 22–32)
Calcium: 8.3 mg/dL — ABNORMAL LOW (ref 8.9–10.3)
Chloride: 102 mmol/L (ref 101–111)
Creatinine, Ser: 1.75 mg/dL — ABNORMAL HIGH (ref 0.61–1.24)
GFR calc Af Amer: 41 mL/min — ABNORMAL LOW (ref >60)
GFR calc non Af Amer: 36 mL/min — ABNORMAL LOW (ref >60)
Glucose, Bld: 114 mg/dL — ABNORMAL HIGH (ref 65–99)
Potassium: 3.5 mmol/L (ref 3.5–5.1)
SODIUM: 138 mmol/L (ref 135–145)
Total Bilirubin: 0.7 mg/dL (ref 0.3–1.2)
Total Protein: 6.1 g/dL — ABNORMAL LOW (ref 6.5–8.1)

## 2014-09-19 LAB — CBC WITH DIFFERENTIAL/PLATELET
BASOS ABS: 0 10*3/uL (ref 0.0–0.1)
Basophils Relative: 0 % (ref 0–1)
Eosinophils Absolute: 0.6 10*3/uL (ref 0.0–0.7)
Eosinophils Relative: 8 % — ABNORMAL HIGH (ref 0–5)
HEMATOCRIT: 29.5 % — AB (ref 39.0–52.0)
Hemoglobin: 9.2 g/dL — ABNORMAL LOW (ref 13.0–17.0)
LYMPHS ABS: 1.4 10*3/uL (ref 0.7–4.0)
Lymphocytes Relative: 19 % (ref 12–46)
MCH: 27.8 pg (ref 26.0–34.0)
MCHC: 31.2 g/dL (ref 30.0–36.0)
MCV: 89.1 fL (ref 78.0–100.0)
MONOS PCT: 7 % (ref 3–12)
Monocytes Absolute: 0.5 10*3/uL (ref 0.1–1.0)
Neutro Abs: 4.8 10*3/uL (ref 1.7–7.7)
Neutrophils Relative %: 66 % (ref 43–77)
Platelets: 215 10*3/uL (ref 150–400)
RBC: 3.31 MIL/uL — ABNORMAL LOW (ref 4.22–5.81)
RDW: 15.5 % (ref 11.5–15.5)
WBC: 7.2 10*3/uL (ref 4.0–10.5)

## 2014-09-19 MED ORDER — PANTOPRAZOLE SODIUM 40 MG PO TBEC: 40.0000 mg | DELAYED_RELEASE_TABLET | Freq: Two times a day (BID) | ORAL | Status: DC

## 2014-09-19 NOTE — Progress Notes (Signed)
D/c instructions reviewed with patient and daughter-in-law.  Verbalized understanding. Pt dc'd to home with daughter-in-law. [place], Eulis Canner 09/19/2014

## 2014-09-19 NOTE — Progress Notes (Signed)
Key Points: Use following P&T approved IV to PO antibiotic change policy.  Description contains the criteria that are approved Note: Policy Excludes:  Esophagectomy patientsPHARMACIST - PHYSICIAN COMMUNICATION DR:    CONCERNING: IV to Oral Route Change Policy  RECOMMENDATION: This patient is receiving Protonix by the intravenous route.  Based on criteria approved by the Pharmacy and Therapeutics Committee, the intravenous medication(s) is/are being converted to the equivalent oral dose form(s).   DESCRIPTION: These criteria include:  The patient is eating (either orally or via tube) and/or has been taking other orally administered medications for a least 24 hours  The patient has no evidence of active gastrointestinal bleeding or impaired GI absorption (gastrectomy, short bowel, patient on TNA or NPO).  If you have questions about this conversion, please contact the Pharmacy Department  []   [phone] )  [facility] []   [phone] )  [facility] []   [phone] )  [facility] []   [phone] )  [facility] []   [phone] )  [facility]   Gildardo Griffes [place], [facility] 09/19/2014 7:31 AM

## 2014-09-19 NOTE — Discharge Summary (Signed)
Physician Discharge Summary  REA RESER OYD:[id number] DOB: 18-Apr-1933 DOA: 09/18/2014  PCP: Purvis Kilts, MD  Admit date: 09/18/2014 Discharge date: 09/19/2014  Time spent: 45 minutes  Recommendations for Outpatient Follow-up:  1. Follow up with your PCP in one week. Needs sutures on the right upper chest removed in a week. 2.   Follow up with your urologist for further treatment of your bladder cancer.   Discharge Diagnoses:  Active Problems:   Postprocedural Bleeding   Discharge Condition: Stable.   Diet recommendation: as tolerated.   Filed Weights   09/18/14 0445 09/19/14 0400  Weight: 98.3 kg (216 lb 11.4 oz) 98.2 kg (216 lb 7.9 oz)    History of present illness: patient was admitted into the ICU 09/18/14 by Dr Ernestina Patches for bleeding from the site of the insertion of the IVC filter, along with increase hematuria from known bladder cancer, and having OB +.  As per his H and P:  "  This is a 81 y.o. year old male with significant past medical history of multiple medical problems including DVT, CAD s/p CABG, CVA, type 2 DM, renal insufficiency s/p nephrectomy, hx/o bladder cancer, combined systolic and diastolic congestive heart failure presenting with post procedural bleeding, hemoccult positive stools, hematuria. Per family, patient went for outpatient procedure for IVC filter placement by interventional radiology earlier in the day. Has a baseline history of lower extremity DVT. Currently on Xarelto. Per family, upon arriving home, pt w/ worsening baseline hematuria as well as persistent bleeding from procedural site. Per family pt was continued on xarelto through procedure. There were no instructions for discontinuation. Pt denies any CP, SOB. Mild abd pain per grandson. Reported ? abd trauma earlier in the week.   Present to the ER afebrile, blood pressure in the 50s to 60s, respirations in the tens to 20s, blood pressure in the 90s to 100s, satting in the mid 90s on room  air. Noted hemoglobin of 10, creatinine 1.8, potassium 3, INR 2.2. Chest x-ray within normal limits. Noted hematuria in urine. Hemoccult-positive.  Hospital Course:  Patient was admitted into the ICU, for closer monitoring mainly.  His Xarelto was discontinued.  His bleeding at the IVF site entry was negligible, and he still has the 4 sutures placed needing to be removed in a week.  For his hematuria, it cleared up without requiring bladder irrigation and no foley was placed.  His hypotension resolved with little IVF, given judiciously to prevent volume overload.  His OB positve stool did not represent significant bleed, and will defer further work up to his PCP.  He was kept in the ICU, and his Hct remained low, but stable at 10 grams per dL.   He was felt to be stable for discharge.  He has appointment with his urologist at [facility] for further investigation of his bladder cancer and hematuria.  He will need to see his PCP in a week, and to have his sutures removed then.  He can be on ASA, but Xarelto will need to be stopped at this time.  Future resumption of full anticoagulation will need to be reassessed.   Thank you for allowing me to participate in the care of this nice gentleman.     Consultations:  NONE.  Discharge Exam: Filed Vitals:   09/19/14 0700  BP: 113/80  Pulse: 61  Temp:   Resp: 14    General: well. Cardiovascular: S1S2. Respiratory: Clear.   Discharge Instructions   Discharge Instructions  Diet - low sodium heart healthy    Complete by:  As directed      Discharge instructions    Complete by:  As directed   Follow up with your PCP and urologist as planned.  Don't take your blood thinner until told by your physicians.     Increase activity slowly    Complete by:  As directed           Current Discharge Medication List    CONTINUE these medications which have NOT CHANGED   Details  aspirin 81 MG chewable tablet Chew 1 tablet (81 mg total) by mouth daily.     donepezil (ARICEPT) 10 MG tablet Take 10 mg by mouth at bedtime.    escitalopram (LEXAPRO) 20 MG tablet Take 20 mg by mouth daily.    feeding supplement, ENSURE ENLIVE, (ENSURE ENLIVE) LIQD Take 237 mLs by mouth 3 (three) times daily between meals. Qty: 237 mL, Refills: 12    fludrocortisone (FLORINEF) 0.1 MG tablet Take 0.1 mg by mouth 2 (two) times daily.    furosemide (LASIX) 80 MG tablet Take 1 tablet (80 mg total) by mouth daily. Qty: 30 tablet, Refills: 6    levothyroxine (SYNTHROID, LEVOTHROID) 125 MCG tablet Take 125 mcg by mouth daily before breakfast.    midodrine (PROAMATINE) 10 MG tablet Take 10 mg by mouth 2 (two) times daily.    pantoprazole (PROTONIX) 40 MG tablet TAKE ONE TABLET EVERY MORNING Qty: 30 tablet, Refills: 4    polyethylene glycol (MIRALAX / GLYCOLAX) packet Take 17 g by mouth at bedtime.    VESICARE 5 MG tablet Take 1 tablet by mouth daily as needed (bladder surgery).     ziprasidone (GEODON) 20 MG capsule Take 1 capsule (20 mg total) by mouth 2 (two) times daily with a meal. Qty: 60 capsule, Refills: 0      STOP taking these medications     rivaroxaban (XARELTO) 20 MG TABS tablet      Rivaroxaban (XARELTO STARTER PACK) 15 & 20 MG TBPK        Allergies  Allergen Reactions  . Lorazepam     Hallucinations, worsens agitation  . Statins Other (See Comments)    MUSCLE ACHES      The results of significant diagnostics from this hospitalization (including imaging, microbiology, ancillary and laboratory) are listed below for reference.    Significant Diagnostic Studies: Ct Abdomen Pelvis Wo Contrast  09/18/2014   CLINICAL DATA:  Rectal bleeding. Hematuria. Recent IVC filter placement.  EXAM: CT ABDOMEN AND PELVIS WITHOUT CONTRAST  TECHNIQUE: Multidetector CT imaging of the abdomen and pelvis was performed following the standard protocol without IV contrast.  COMPARISON:  03/13/2014  FINDINGS: There are unremarkable unenhanced appearances of the  liver, gallbladder, bile ducts, pancreas, spleen and adrenals.  There is right nephrectomy. The left kidney is remarkable only for multiple collecting system calculi measuring up to 5 mm. Left ureter is unremarkable. Urinary bladder is unremarkable. The polypoid urinary bladder mass observed on 03/13/2014 is not evident on this scan although the lack of intravenous contrast would decrease sensitivity.  Bowel is unremarkable except for a small hiatal hernia and uncomplicated colonic diverticulosis.  The abdominal aorta is normal in caliber. There is mild atherosclerotic calcification. There is no adenopathy in the abdomen or pelvis.  The IVC filter is well positioned with tip at the level of the left renal vein.  No significant skeletal lesions are evident. There is moderate lower lumbar facet arthritis.  There are small pleural effusions bilaterally, right greater than left.  IMPRESSION: 1. Left nephrolithiasis 2. Small hiatal hernia 3. Diverticulosis 4. Well-positioned IVC filter 5. Bilateral pleural effusions, small.   Electronically Signed   By: Andreas Newport M.D.   On: 09/18/2014 06:58   Dg Chest 2 View  09/18/2014   CLINICAL DATA:  Bleeding from rectum and venous access site.  EXAM: CHEST  2 VIEW  COMPARISON:  08/17/2014  FINDINGS: There is mild unchanged cardiomegaly. The lungs are clear. There are no pleural effusions. There is mild chronic right lateral base pleural thickening. Pulmonary vasculature is normal.  IMPRESSION: Mild cardiomegaly.  No acute cardiopulmonary findings.   Electronically Signed   By: Andreas Newport M.D.   On: 09/18/2014 02:15   Dg Ankle Complete Left  09/14/2014   CLINICAL DATA:  Three days of left foot and ankle pain, inability to bear weight, no known injury.  EXAM: LEFT ANKLE COMPLETE - 3+ VIEW  COMPARISON:  Left foot series of today's date  FINDINGS: The ankle joint mortise is preserved. The talar dome is intact. There small amount of soft tissue swelling anteriorly and  medially. The talus and calcaneus exhibit no acute abnormalities. There are plantar and Achilles region calcaneal spurs.  IMPRESSION: There is no acute or significant chronic bony abnormality of the left ankle. There is mild soft tissue swelling.   Electronically Signed   By: David  Martinique M.D.   On: 09/14/2014 12:53   Ct Angio Chest Pe W/cm &/or Wo Cm  09/11/2014   CLINICAL DATA:  Right lower extremity deep venous thrombosis. History of urinary bladder neoplasm  EXAM: CT ANGIOGRAPHY CHEST WITH CONTRAST  TECHNIQUE: Multidetector CT imaging of the chest was performed using the standard protocol during bolus administration of intravenous contrast. Multiplanar CT image reconstructions and MIPs were obtained to evaluate the vascular anatomy.  CONTRAST:  38mL OMNIPAQUE IOHEXOL 350 MG/ML SOLN  COMPARISON:  Chest radiograph Aug 17, 2014  FINDINGS: There is no demonstrable pulmonary embolus. There is atherosclerotic change in aorta. There is no thoracic aortic aneurysm.  There are scattered small bullae in both upper lobes.  There are bilateral pleural effusions. There is patchy infiltrate in the inferior lingula as well as in the anterior segment of the left upper lobe. There is mild bibasilar lung atelectasis.  On axial slice 36 series 7, there is a nodular opacity in the inferior aspect of the anterior segment of the right upper lobe measuring 7 mm.  Visualized thyroid is unremarkable. There are multiple small mediastinal lymph nodes but no adenopathy. The patient is status post coronary artery bypass grafting. Pericardium is not thickened. Heart is mildly enlarged. There is contrast refluxing into the inferior vena cava and hepatic vein suggesting a degree of right heart strain/failure.  No focal lesions are identified in the visualized upper abdomen.  There is degenerative change in the thoracic spine. There are no blastic or lytic bone lesions.  Review of the MIP images confirms the above findings.  IMPRESSION: No  demonstrable pulmonary embolus.  Cardiomegaly with bilateral pleural effusions. There may be a degree of congestive heart failure. There is no frank edema, however. Reflux of contrast into the hepatic veins and inferior vena cava suggests a degree of right heart strain/failure.  Areas of alveolar opacity on the left appear more consistent with pneumonia than edema.  7 mm nodular opacity right upper lobe. Followup of this nodular opacity should be based on Fleischner Society guidelines. If the patient  is at high risk for bronchogenic carcinoma, follow-up chest CT at 3-43months is recommended. If the patient is at low risk for bronchogenic carcinoma, follow-up chest CT at 6-12 months is recommended. This recommendation follows the consensus statement: Guidelines for Management of Small Pulmonary Nodules Detected on CT Scans: A Statement from the [employer] as published in Radiology 2005; 237:395-400.   Electronically Signed   By: Lowella Grip III M.D.   On: 09/11/2014 14:15   Ir Ivc Filter Plmt / S&i /img Guid/mod Sed  09/17/2014   CLINICAL DATA:  DVT, hematuria. Needs to go up anticoagulation for urologic procedure. Caval filtration requested. Renal insufficiency.  EXAM: INFERIOR VENACAVOGRAM  IVC FILTER PLACEMENT UNDER FLUOROSCOPY  FLUOROSCOPY TIME:  24 seconds, 149 mGy  TECHNIQUE: The procedure, risks (including but not limited to bleeding, infection, organ damage ), benefits, and alternatives were explained to the patient. Questions regarding the procedure were encouraged and answered. The patient understands and consents to the procedure. Patency of the right IJ vein was confirmed with ultrasound with image documentation. An appropriate skin site was determined. Skin site was marked, prepped with chlorhexidine, and draped using maximum barrier technique. The region was infiltrated locally with 1% lidocaine.  Intravenous Fentanyl and Versed were administered as conscious sedation during continuous  cardiorespiratory monitoring by the radiology RN, with a total moderate sedation time of 6 minutes.  Under real-time ultrasound guidance, the right IJ vein was accessed with a 21 gauge micropuncture needle; the needle tip within the vein was confirmed with ultrasound image documentation. The needle was exchanged over a 018 guidewire for a transitional dilator, which allow advancement of the [facility] wire into the IVC. A long 6 French vascular sheath was placed for inferior CO2 venacavography. This demonstrated no caval thrombus. Renal vein inflows were evident.  The [facility] IVC filter was advanced through the sheath and successfully deployed under fluoroscopy at the L2 infrarenal level. Followup CO2 cavagram demonstrates stable filter position and no evident complication. The sheath was removed and hemostasis achieved at the site. No immediate complication.  IMPRESSION: 1. Normal IVC. No thrombus or significant anatomic variation. 2. Technically successful infrarenal IVC filter placement. This is a retrievable model.   Electronically Signed   By: Lucrezia Europe M.D.   On: 09/17/2014 15:56   Dg Foot Complete Left  09/14/2014   CLINICAL DATA:  Pain in the arch of the foot extending into the ankle for the past 3 days without known injury, inability to bear weight  EXAM: LEFT FOOT - COMPLETE 3+ VIEW  COMPARISON:  Left ankle of today's date  FINDINGS: The bones are mildly osteopenic. The interphalangeal and metatarsophalangeal joint its are within the limits of normal for age. There is no acute or healing fracture of the metatarsals or phalanges. The midfoot and hindfoot exhibit no acute abnormalities. There are plantar and Achilles region calcaneal spurs. The soft tissues of the foot are unremarkable.  IMPRESSION: There is no acute bony abnormality of the left foot.  If the patient's symptoms persist and remain unexplained, MRI may be useful.   Electronically Signed   By: David  Martinique M.D.   On: 09/14/2014 12:54     Microbiology: Recent Results (from the past 240 hour(s))  MRSA PCR Screening     Status: None   Collection Time: 09/18/14  4:40 AM  Result Value Ref Range Status   MRSA by PCR NEGATIVE NEGATIVE Final    Comment:        The GeneXpert MRSA  Assay (FDA approved for NASAL specimens only), is one component of a comprehensive MRSA colonization surveillance program. It is not intended to diagnose MRSA infection nor to guide or monitor treatment for MRSA infections.      Labs: Basic Metabolic Panel:  Recent Labs Lab 09/18/14 0130 09/18/14 0408 09/18/14 1137 09/18/14 1539 09/19/14 0438  NA 137 138 137 138 138  K 3.0* 2.8* 3.8 3.5 3.5  CL 98* 99* 96* 100* 102  CO2 32 33* 32 32 30  GLUCOSE 128* 117* 141* 144* 114*  BUN 25* 25* 22* 22* 21*  CREATININE 1.83* 1.80* 1.69* 1.80* 1.75*  CALCIUM 8.4* 8.1* 8.3* 8.2* 8.3*  MG  --   --  2.3  --   --    Liver Function Tests:  Recent Labs Lab 09/18/14 0130 09/18/14 0408 09/19/14 0438  AST 15 13* 16  ALT 7* 8* 9*  ALKPHOS 86 78 77  BILITOT 0.5 0.6 0.7  PROT 6.6 6.1* 6.1*  ALBUMIN 3.2* 3.0* 3.0*   No results for input(s): LIPASE, AMYLASE in the last 168 hours. No results for input(s): AMMONIA in the last 168 hours. CBC:  Recent Labs Lab 09/18/14 0130 09/18/14 0408 09/18/14 1137 09/18/14 2100 09/19/14 0438  WBC 8.0 7.3 6.5 7.5 7.2  NEUTROABS 5.5 4.9 4.5 4.9 4.8  HGB 9.9* 9.3* 9.5* 9.6* 9.2*  HCT 31.9* 30.3* 31.0* 30.6* 29.5*  MCV 89.1 89.6 89.3 89.2 89.1  PLT 261 224 217 234 215   Cardiac Enzymes:  Recent Labs Lab 09/18/14 0408 09/18/14 1137 09/18/14 1539  TROPONINI 0.04* 0.04* 0.04*    Recent Labs  08/17/14 0900 09/18/14 0130  BNP 2588.0* 2240.0*   CBG:  Recent Labs Lab 09/17/14 1131  GLUCAP 105*   Signed:  Landy Dunnavant  [facility] 09/19/2014, 8:20 AM

## 2014-09-22 DIAGNOSIS — Z01810 Encounter for preprocedural cardiovascular examination: Secondary | ICD-10-CM | POA: Diagnosis not present

## 2014-09-22 DIAGNOSIS — E6609 Other obesity due to excess calories: Secondary | ICD-10-CM | POA: Diagnosis not present

## 2014-09-22 DIAGNOSIS — Z4802 Encounter for removal of sutures: Secondary | ICD-10-CM | POA: Diagnosis not present

## 2014-09-22 DIAGNOSIS — Z6832 Body mass index (BMI) 32.0-32.9, adult: Secondary | ICD-10-CM | POA: Diagnosis not present

## 2014-09-23 ENCOUNTER — Other Ambulatory Visit: Payer: Self-pay | Admitting: Urology

## 2014-09-29 ENCOUNTER — Ambulatory Visit (INDEPENDENT_AMBULATORY_CARE_PROVIDER_SITE_OTHER): Payer: Medicare Other | Admitting: Cardiovascular Disease

## 2014-09-29 ENCOUNTER — Encounter: Payer: Self-pay | Admitting: Cardiovascular Disease

## 2014-09-29 ENCOUNTER — Other Ambulatory Visit (HOSPITAL_COMMUNITY): Payer: Self-pay | Admitting: Anesthesiology

## 2014-09-29 VITALS — BP 108/74 | HR 57 | Ht 70.0 in | Wt 229.0 lb

## 2014-09-29 DIAGNOSIS — E785 Hyperlipidemia, unspecified: Secondary | ICD-10-CM

## 2014-09-29 DIAGNOSIS — I951 Orthostatic hypotension: Secondary | ICD-10-CM

## 2014-09-29 DIAGNOSIS — Z79899 Other long term (current) drug therapy: Secondary | ICD-10-CM

## 2014-09-29 DIAGNOSIS — N183 Chronic kidney disease, stage 3 unspecified: Secondary | ICD-10-CM

## 2014-09-29 DIAGNOSIS — D500 Iron deficiency anemia secondary to blood loss (chronic): Secondary | ICD-10-CM

## 2014-09-29 DIAGNOSIS — I824Z1 Acute embolism and thrombosis of unspecified deep veins of right distal lower extremity: Secondary | ICD-10-CM

## 2014-09-29 DIAGNOSIS — I5043 Acute on chronic combined systolic (congestive) and diastolic (congestive) heart failure: Secondary | ICD-10-CM | POA: Diagnosis not present

## 2014-09-29 DIAGNOSIS — I2510 Atherosclerotic heart disease of native coronary artery without angina pectoris: Secondary | ICD-10-CM

## 2014-09-29 DIAGNOSIS — C679 Malignant neoplasm of bladder, unspecified: Secondary | ICD-10-CM

## 2014-09-29 DIAGNOSIS — Z951 Presence of aortocoronary bypass graft: Secondary | ICD-10-CM

## 2014-09-29 MED ORDER — TORSEMIDE 20 MG PO TABS: 20.0000 mg | ORAL_TABLET | Freq: Two times a day (BID) | ORAL | Status: DC

## 2014-09-29 MED ORDER — METOLAZONE 2.5 MG PO TABS: ORAL_TABLET | ORAL | Status: DC

## 2014-09-29 NOTE — Patient Instructions (Addendum)
Your physician recommends that you schedule a follow-up appointment on Friday, June 24 th in the [place] office  Please have lab work done the Day before, Thursday,  CBC,BMET  STOP Lasix  Take Torsemide 20 mg twice a day, first dose this afternoon  Take Metolazone 2.5 mg daily for 3 days (Tues,Wed,Thurs)  and then STOP, take first dose this afternoon     Thank you for choosing [facility] !

## 2014-09-29 NOTE — Patient Instructions (Addendum)
[name] [name]  09/29/2014   Your procedure is scheduled on: Monday 10/05/2014  Report to [facility]  Entrance take [facility]  elevators to 3rd floor  and follow signs to  [place] at [place].  Call this number if you have problems the morning of surgery [phone]   Remember: ONLY 1 PERSON MAY GO WITH YOU TO SHORT STAY TO GET  READY MORNING OF [place].  Do not eat food or drink liquids :After Midnight.     Take these medicines the morning of surgery with A SIP OF WATER: Lexapro, Levothyroxine, Midodrine, Florinef                               You may not have any metal on your body including hair pins and              piercings  Do not wear jewelry, make-up, lotions, powders or perfumes, deodorant             Do not wear nail polish.  Do not shave  48 hours prior to surgery.              Men may shave face and neck.   Do not bring valuables to the hospital. [place].  Contacts, dentures or bridgework may not be worn into surgery.  Leave suitcase in the car. After surgery it may be brought to your room.     Patients discharged the day of surgery will not be allowed to drive home.  Name and phone number of your driver:  Special Instructions: N/A              Please read over the following fact sheets you were given: _____________________________________________________________________             [facility] - Preparing for Surgery Before surgery, you can play an important role.  Because skin is not sterile, your skin needs to be as free of germs as possible.  You can reduce the number of germs on your skin by washing with CHG (chlorahexidine gluconate) soap before surgery.  CHG is an antiseptic cleaner which kills germs and bonds with the skin to continue killing germs even after washing. Please DO NOT use if you have an allergy to CHG or antibacterial soaps.  If your skin becomes  reddened/irritated stop using the CHG and inform your nurse when you arrive at Short Stay. Do not shave (including legs and underarms) for at least 48 hours prior to the first CHG shower.  You may shave your face/neck. Please follow these instructions carefully:  1.  Shower with CHG Soap the night before surgery and the  morning of Surgery.  2.  If you choose to wash your hair, wash your hair first as usual with your  normal  shampoo.  3.  After you shampoo, rinse your hair and body thoroughly to remove the  shampoo.                           4.  Use CHG as you would any other liquid soap.  You can apply chg directly  to the skin and wash  Gently with a scrungie or clean washcloth.  5.  Apply the CHG Soap to your body ONLY FROM THE NECK DOWN.   Do not use on face/ open                           Wound or open sores. Avoid contact with eyes, ears mouth and genitals (private parts).                       Wash face,  Genitals (private parts) with your normal soap.             6.  Wash thoroughly, paying special attention to the area where your surgery  will be performed.  7.  Thoroughly rinse your body with warm water from the neck down.  8.  DO NOT shower/wash with your normal soap after using and rinsing off  the CHG Soap.                9.  Pat yourself dry with a clean towel.            10.  Wear clean pajamas.            11.  Place clean sheets on your bed the night of your first shower and do not  sleep with pets. Day of Surgery : Do not apply any lotions/deodorants the morning of surgery.  Please wear clean clothes to the hospital/surgery center.  FAILURE TO FOLLOW THESE INSTRUCTIONS MAY RESULT IN THE CANCELLATION OF YOUR SURGERY PATIENT SIGNATURE_________________________________  NURSE SIGNATURE__________________________________  ________________________________________________________________________

## 2014-09-29 NOTE — Progress Notes (Signed)
09/15/2014-Surgical Clearance from Dr. Hilma Favors on chart. 09/22/2014-office note from Dr. Sharilyn Sites on chart. 09/18/2014-noted EKG, DG chest 2 view, CT abd./pelvis wo contrast in EPIC. 09/17/2014-noted IVC filter placement in Radiology -note in EPIC. 09/19/2014- noted labs in EPIC-CBC w/diff., CMET  08/17/2014-noted ECHO in EPIC.

## 2014-09-29 NOTE — Progress Notes (Signed)
Patient ID: [name] [name], male   DOB: 10/16/1933, 81 y.o.   MRN: [id number]      SUBJECTIVE: The patient returns for routine follow-up. CT angiography of the chest did not demonstrate any evidence of pulmonary embolism. He underwent IVC filter placement on 09/17/14. He had some bleeding from the placement site, hemoccult positive stools, and hematuria, and went to the ED on 6/10. Sutures were placed. He was hospitalized for Hgb monitoring. Xarelto was discontinued.  Additional PMH includes combined systolic and diastolic heart failure.  LVEF by echo on 08/17/14 40-45%, with moderate to severely reduced RV systolic function. He has CAD status post STEMI treated with PCI of the RCA in 12/2013 followed by CABG with a LIMA to the LAD, SVG to the OM, SVG to the diagonal branch and PDA.  He also has orthostatic hypotension and is on Florinef and Midrin. He has hyperlipidemia intolerant to statins, obstructive sleep apnea on CPAP, dementia, and stage 3 CKD.  Wt 229 lbs (217 lbs on 09/09/14).  Here with daughter in law, [name] [name].  For the past few days, he has developed worsening dyspnea as well as abdominal distention and leg swelling. He has also been wheezing over the past few days, although [name] [name] says this has improved since yesterday.  He is scheduled to undergo preop exam at [facility] tomorrow with outpatient surgery scheduled for next Monday.   Review of Systems: As per "subjective", otherwise negative.  Allergies  Allergen Reactions  . Lorazepam     Hallucinations, worsens agitation  . Statins Other (See Comments)    MUSCLE ACHES    Current Outpatient Prescriptions  Medication Sig Dispense Refill  . aspirin 81 MG chewable tablet Chew 1 tablet (81 mg total) by mouth daily.    [name] donepezil (ARICEPT) 10 MG tablet Take 10 mg by mouth at bedtime.    [name] escitalopram (LEXAPRO) 20 MG tablet Take 20 mg by mouth daily.    . feeding supplement, ENSURE ENLIVE, (ENSURE ENLIVE) LIQD Take 237 mLs  by mouth 3 (three) times daily between meals. 237 mL 12  . fludrocortisone (FLORINEF) 0.1 MG tablet Take 0.2 mg by mouth daily.     . furosemide (LASIX) 80 MG tablet Take 1 tablet (80 mg total) by mouth daily. 30 tablet 6  . levothyroxine (SYNTHROID, LEVOTHROID) 125 MCG tablet Take 125 mcg by mouth daily before breakfast.    . midodrine (PROAMATINE) 10 MG tablet Take 10 mg by mouth 2 (two) times daily.    . pantoprazole (PROTONIX) 40 MG tablet TAKE ONE TABLET EVERY MORNING 30 tablet 4  . polyethylene glycol (MIRALAX / GLYCOLAX) packet Take 17 g by mouth at bedtime.    . ziprasidone (GEODON) 20 MG capsule Take 1 capsule (20 mg total) by mouth 2 (two) times daily with a meal. 60 capsule 0   No current facility-administered medications for this visit.    Past Medical History  Diagnosis Date  . Chronic constipation   . GERD (gastroesophageal reflux disease)   . Arthritis   . Hyperlipidemia   . Orthostatic hypotension 11/19/2013  . Coronary artery disease     CARDIOLOGIST-  DR BERRY  --  HX  STEMI  12-17-2013  S/P  CABG X4  12-18-2013  . Mild dementia   . Type 2 diabetes mellitus   . Hypothyroidism   . History of esophageal dilatation   . History of gastritis   . History of hiatal hernia   . History of bladder cancer  hx  TCC low grade  . Prostate cancer     low-grade  . Kidney tumor     right  . BPH (benign prostatic hyperplasia)   . CKD (chronic kidney disease) stage 3, GFR 30-59 ml/min   . History of kidney stones   . ED (erectile dysfunction)   . S/P CABG x 4     12-18-2013  . Bladder tumor   . Complication of anesthesia     HARD TO WAKE  . OSA on CPAP     mild osa   per sleep study 07-11-2009- patient states does not use regularly  . DVT (deep venous thrombosis)     Past Surgical History  Procedure Laterality Date  . Upper gastrointestinal endoscopy  04/06/2010  &  01-08-2013    w/ esophageal dilatation  . Hemorrhoid surgery    . Hand surgery      right  .  Wrist fracture surgery      pins placed-left  . Cystoscopy with biopsy  01/09/2012    Procedure: CYSTOSCOPY WITH BIOPSY;  Surgeon: Marissa Nestle, MD;  Location: [facility];  Service: Urology;  Laterality: N/A;  Cystoscopy with Multiple Bladder Biopsies  . Esophagogastroduodenoscopy (egd) with esophageal dilation N/A 01/08/2013    Procedure: ESOPHAGOGASTRODUODENOSCOPY (EGD) WITH ESOPHAGEAL DILATION;  Surgeon: Rogene Houston, MD;  Location: [facility];  Service: Endoscopy;  Laterality: N/A;  120  . Left heart cath N/A 12/17/2013    Procedure: LEFT HEART CATH;  Surgeon: Leonie Man, MD;  Location: [facility] CATH LAB;  Service: Cardiovascular;  Laterality: N/A;  . Left heart catheterization with coronary angiogram N/A 12/17/2013    Procedure: LEFT HEART CATHETERIZATION WITH CORONARY ANGIOGRAM;  Surgeon: Leonie Man, MD;  Location: [facility] CATH LAB;  Service: Cardiovascular;  Laterality: N/A;  . Colonoscopy with esophagogastroduodenoscopy (egd)  08-15-2006    w/ esophageal dilatation  . Umbilical hernia repair  04-24-2003  . Transurethral resection of bladder tumor  06-14-2004  . Transurethral resection of prostate  10-04-2004  . Cysto/  removal bladder calculus/  resection bladder tumor  10-16-2006  . Cystostomy w/ bladder biopsy  01-09-2012  . Cardiac catheterization  12-17-2013   dr Shanon Brow harding    severe disease RCA with thrombotic appearing subtotal occlusions/  95-99%  OM2/  diffuse disease LAD  and D1/  mild to moderate basal to mid inferior hypokinesis/  severe elevated LVEDP/  ef 45-50%  . Coronary artery bypass graft N/A 12/18/2013    Procedure: Coronary artery bypass grafting times four using left internal mammary artery and endoscopically harvested right saphenous vein graft;  Surgeon: Gaye Pollack, MD;  [facility]: [facility] OR;  Service: Open Heart Surgery;  Laterality: N/A;  . Transthoracic echocardiogram  11-14-2012    grade I diastolic dysfunction/  ef 55-60%/  trivial MR  and TR  .  Cardiovascular stress test  08-09-2009  dr berry    mild to moderate perfusion defect in basal , mid, and apical inferior regions consistent with an infart/scar/   No evidence ischemia/  ef 51%/  no ECG changes/   low risk scan  . Transurethral resection of bladder tumor N/A 04/07/2014    Procedure: TRANSURETHRAL RESECTION OF BLADDER TUMOR (TURBT);  Surgeon: Arvil Persons, MD;  Location: [facility];  Service: Urology;  Laterality: N/A;  . Cystoscopy/retrograde/ureteroscopy Right 04/07/2014    Procedure: CYSTOSCOPY/RETROGRADE/URETEROSCOPY WITH BIOPSY OF RENAL PELVIS TUMOR;  Surgeon: Arvil Persons, MD;  Location: [facility];  Service: Urology;  Laterality: Right;  . Robot assisted laparoscopic nephrectomy Right 06/05/2014    Procedure: ROBOTIC ASSISTED LAPAROSCOPIC RIGHT NEPHROURETERECTOMY, VENTRAL HERNIA REPAIR ;  Surgeon: Ardis Hughs, MD;  Location: [facility];  Service: Urology;  Laterality: Right;  . Cystoscopy w/ ureteral stent placement Right 06/05/2014    Procedure: CYSTOSCOPY ;  Surgeon: Ardis Hughs, MD;  Location: [facility];  Service: Urology;  Laterality: Right;  . Radiology with anesthesia N/A 06/22/2014    Procedure: MRI BRAIN W/O CONTRAST;  Surgeon: Medication Radiologist, MD;  Location: [place];  Service: Radiology;  Laterality: N/A;    History   Social History  . Marital Status: Divorced    Spouse Name: N/A  . Number of Children: 3  . Years of Education: HS   Occupational History  .     Social History Main Topics  . Smoking status: Former Smoker -- 1.00 packs/day for 4 years    Types: Cigarettes    Start date: 09/08/1952    Quit date: 09/09/1959  . Smokeless tobacco: Former Systems developer    Types: [place] date: 01/03/1960  . Alcohol Use: No  . Drug Use: No  . Sexual Activity: Yes    Birth Control/ Protection: None   Other Topics Concern  . Not on file   Social History Narrative   Patient is single and lives alone.   Patient is retired.    Patient has a high school education.   Patient is right-handed.   Patient drinks one cup of coffee and one cup of soda or tea daily.   Patient has three adult children.     Filed Vitals:   09/29/14 1348  BP: 108/74  Pulse: 57  Height: 5\' 10"  (1.778 m)  Weight: 229 lb (103.874 kg)  SpO2: 97%    PHYSICAL EXAM General: Pale, audible wheezing, no distress. HEENT: Normal. Neck: +JVD, no thyromegaly. Lungs: Bibasilar rales and diminished 1/4 up bilaterally. CV: Regular rate and rhythm, normal S1/S2, no S3/S4, no murmur. 1+ pitting pretibial edema bilaterally. Abdomen: Firm, +distention.  Neurologic: Alert and oriented.  Psych: Normal affect. Skin: Facial pallor. Musculoskeletal: Normal range of motion, no gross deformities. Extremities: No clubbing or cyanosis.   ECG: Most recent ECG reviewed.    ASSESSMENT AND PLAN:  Acute on chronic systolic and diastolic heart failure, NYHA class 3 Wt up 12 lbs since 09/09/14. On Lasix 80 mg daily.  Will switch Lasix to torsemide 20 mg bid and also give metolazone 2.5 mg daily (metolazone today, Wed, and Thurs). Will schedule a f/u appt with PA/NP at [facility] office this Friday. If he remains decompensated, I recommend hospitalization for IV diuresis in anticipation of surgery next week.  Lower leg DVT (deep venous thrombosis) Patient was diagnosed with right lower extremity DVT earlier this year. No longer on Xarelto and s/p IVC filter placement. He is very high risk for a major adverse cardiac event for any type of surgery with his multiple medical problems.  Orthostatic hypotension Continue Florinef and Midodrin.  CKD (chronic kidney disease) stage 3, GFR 30-59 ml/min Patient only has one kidney. BUN/SCr 21/1.75 on 09/19/14 . Will check BMET and CBC on Thursday given change in diuretic regimen.  S/P CABG x 4 Stable without chest pain. Continue ASA 81 mg.  Hematuria with bladder tumors Found to have recurrent bladder tumors that  need to be removed, planned for next Monday. Would recommend this be done as an inpatient given his  ongoing problems with CHF. He is being followed closely by urology.   Dispo: f/u Friday at [facility] office with PA/NP.  Time spent: 40 minutes, of which greater than 50% was spent reviewing symptoms, relevant blood tests and studies, and discussing management plan with the patient.   Kate Sable, M.D., F.A.C.C.

## 2014-09-30 ENCOUNTER — Encounter (HOSPITAL_COMMUNITY): Payer: Self-pay | Admitting: Anesthesiology

## 2014-09-30 ENCOUNTER — Encounter (HOSPITAL_COMMUNITY): Payer: Self-pay

## 2014-09-30 ENCOUNTER — Encounter (HOSPITAL_COMMUNITY)
Admission: RE | Admit: 2014-09-30 | Discharge: 2014-09-30 | Disposition: A | Discharge status: Home / Self Care | Payer: Medicare Other | Source: Ambulatory Visit | Attending: Urology | Admitting: Urology

## 2014-09-30 DIAGNOSIS — E876 Hypokalemia: Secondary | ICD-10-CM | POA: Diagnosis not present

## 2014-09-30 DIAGNOSIS — E785 Hyperlipidemia, unspecified: Secondary | ICD-10-CM | POA: Diagnosis not present

## 2014-09-30 DIAGNOSIS — N400 Enlarged prostate without lower urinary tract symptoms: Secondary | ICD-10-CM | POA: Diagnosis not present

## 2014-09-30 DIAGNOSIS — N179 Acute kidney failure, unspecified: Secondary | ICD-10-CM | POA: Diagnosis not present

## 2014-09-30 DIAGNOSIS — Z79899 Other long term (current) drug therapy: Secondary | ICD-10-CM | POA: Diagnosis not present

## 2014-09-30 DIAGNOSIS — E860 Dehydration: Secondary | ICD-10-CM | POA: Diagnosis not present

## 2014-09-30 DIAGNOSIS — Z951 Presence of aortocoronary bypass graft: Secondary | ICD-10-CM | POA: Diagnosis not present

## 2014-09-30 DIAGNOSIS — Z905 Acquired absence of kidney: Secondary | ICD-10-CM | POA: Diagnosis not present

## 2014-09-30 DIAGNOSIS — M6281 Muscle weakness (generalized): Secondary | ICD-10-CM | POA: Diagnosis not present

## 2014-09-30 DIAGNOSIS — F0390 Unspecified dementia without behavioral disturbance: Secondary | ICD-10-CM | POA: Diagnosis not present

## 2014-09-30 DIAGNOSIS — K219 Gastro-esophageal reflux disease without esophagitis: Secondary | ICD-10-CM | POA: Diagnosis not present

## 2014-09-30 DIAGNOSIS — I5042 Chronic combined systolic (congestive) and diastolic (congestive) heart failure: Secondary | ICD-10-CM | POA: Diagnosis not present

## 2014-09-30 DIAGNOSIS — I252 Old myocardial infarction: Secondary | ICD-10-CM | POA: Diagnosis not present

## 2014-09-30 DIAGNOSIS — I951 Orthostatic hypotension: Secondary | ICD-10-CM | POA: Diagnosis not present

## 2014-09-30 DIAGNOSIS — E119 Type 2 diabetes mellitus without complications: Secondary | ICD-10-CM | POA: Diagnosis not present

## 2014-09-30 DIAGNOSIS — R1310 Dysphagia, unspecified: Secondary | ICD-10-CM | POA: Diagnosis not present

## 2014-09-30 DIAGNOSIS — I259 Chronic ischemic heart disease, unspecified: Secondary | ICD-10-CM | POA: Diagnosis not present

## 2014-09-30 DIAGNOSIS — D494 Neoplasm of unspecified behavior of bladder: Secondary | ICD-10-CM | POA: Diagnosis not present

## 2014-09-30 DIAGNOSIS — E039 Hypothyroidism, unspecified: Secondary | ICD-10-CM | POA: Diagnosis not present

## 2014-09-30 DIAGNOSIS — R531 Weakness: Secondary | ICD-10-CM | POA: Diagnosis not present

## 2014-09-30 DIAGNOSIS — N183 Chronic kidney disease, stage 3 (moderate): Secondary | ICD-10-CM | POA: Diagnosis not present

## 2014-09-30 DIAGNOSIS — I130 Hypertensive heart and chronic kidney disease with heart failure and stage 1 through stage 4 chronic kidney disease, or unspecified chronic kidney disease: Secondary | ICD-10-CM | POA: Diagnosis not present

## 2014-09-30 DIAGNOSIS — K5900 Constipation, unspecified: Secondary | ICD-10-CM | POA: Diagnosis not present

## 2014-09-30 DIAGNOSIS — Z85528 Personal history of other malignant neoplasm of kidney: Secondary | ICD-10-CM | POA: Diagnosis not present

## 2014-09-30 DIAGNOSIS — Z7982 Long term (current) use of aspirin: Secondary | ICD-10-CM | POA: Diagnosis not present

## 2014-09-30 DIAGNOSIS — G4733 Obstructive sleep apnea (adult) (pediatric): Secondary | ICD-10-CM | POA: Diagnosis not present

## 2014-09-30 DIAGNOSIS — I2510 Atherosclerotic heart disease of native coronary artery without angina pectoris: Secondary | ICD-10-CM | POA: Diagnosis not present

## 2014-09-30 DIAGNOSIS — M1990 Unspecified osteoarthritis, unspecified site: Secondary | ICD-10-CM | POA: Diagnosis not present

## 2014-09-30 DIAGNOSIS — Z86718 Personal history of other venous thrombosis and embolism: Secondary | ICD-10-CM | POA: Diagnosis not present

## 2014-09-30 HISTORY — DX: Cerebral infarction, unspecified: I63.9

## 2014-09-30 HISTORY — DX: Reserved for inherently not codable concepts without codable children: IMO0001

## 2014-09-30 HISTORY — DX: Major depressive disorder, single episode, unspecified: F32.9

## 2014-09-30 HISTORY — DX: Acute myocardial infarction, unspecified: I21.9

## 2014-09-30 HISTORY — DX: Depression, unspecified: F32.A

## 2014-09-30 NOTE — Consult Note (Signed)
Anesthesia Note: PAT asked team to see [name] [name] a 81 year old patient of Dr. Smitty Pluck scheduled for Transurethral Resection of Bladder Tumor on 10/05/2014.  Mr. Seidel has multiple medical problems particularly CAD S/P CABG, combined systolic and diastolic congestive heart failure, CVA, DM, S/P nephrectomy, bladder cancer and recently IVC filter placement.  More recently,Mr. Hueber saw his cardiologist 6/21/ 2016 for SOB and given Lasix. Mr. Poitra will follow up again Friday.  PAT team called Dr. Carlton Adam office today to inform them of above and for need to follow up with patient as well prior to surgery Monday 10/05/2014.  Finis Bud, MD Anesthesia Attending

## 2014-09-30 NOTE — Progress Notes (Signed)
Consulted Anesthesia, Dr. Finis Bud to see patient for upcoming surgery on 10/05/2014 by Dr. Louis Meckel. Reviewed notes from Dr. Bronson Ing from 09/29/2014 with her recommendations for patient. Spoke to Darrel Reach at [facility]rology to let her know about his visit to Cardiology on 09/29/2014.

## 2014-10-01 LAB — BASIC METABOLIC PANEL
BUN: 28 mg/dL — ABNORMAL HIGH (ref 6–23)
CO2: 35 mEq/L — ABNORMAL HIGH (ref 19–32)
Calcium: 9.1 mg/dL (ref 8.4–10.5)
Chloride: 93 mEq/L — ABNORMAL LOW (ref 96–112)
Creat: 2.39 mg/dL — ABNORMAL HIGH (ref 0.50–1.35)
Glucose, Bld: 111 mg/dL — ABNORMAL HIGH (ref 70–99)
POTASSIUM: 3.4 meq/L — AB (ref 3.5–5.3)
Sodium: 143 mEq/L (ref 135–145)

## 2014-10-01 LAB — CBC
HCT: 35.4 % — ABNORMAL LOW (ref 39.0–52.0)
HEMOGLOBIN: 11.2 g/dL — AB (ref 13.0–17.0)
MCH: 27 pg (ref 26.0–34.0)
MCHC: 31.6 g/dL (ref 30.0–36.0)
MCV: 85.3 fL (ref 78.0–100.0)
MPV: 8.9 fL (ref 8.6–12.4)
Platelets: 291 10*3/uL (ref 150–400)
RBC: 4.15 MIL/uL — AB (ref 4.22–5.81)
RDW: 16.2 % — ABNORMAL HIGH (ref 11.5–15.5)
WBC: 8.9 10*3/uL (ref 4.0–10.5)

## 2014-10-02 ENCOUNTER — Other Ambulatory Visit: Payer: Self-pay

## 2014-10-02 ENCOUNTER — Ambulatory Visit (INDEPENDENT_AMBULATORY_CARE_PROVIDER_SITE_OTHER): Payer: Medicare Other | Admitting: Physician Assistant

## 2014-10-02 ENCOUNTER — Encounter (HOSPITAL_COMMUNITY): Payer: Self-pay | Admitting: *Deleted

## 2014-10-02 ENCOUNTER — Inpatient Hospital Stay (HOSPITAL_COMMUNITY)
Admission: RE | Admit: 2014-10-02 | Discharge: 2014-10-02 | Disposition: A | Discharge status: Home / Self Care | Payer: Self-pay | Source: Ambulatory Visit

## 2014-10-02 ENCOUNTER — Encounter: Payer: Self-pay | Admitting: Physician Assistant

## 2014-10-02 ENCOUNTER — Inpatient Hospital Stay (HOSPITAL_COMMUNITY)
Admission: EM | Admit: 2014-10-02 | Discharge: 2014-10-04 | DRG: 683 | Disposition: A | Discharge status: Home / Self Care | Payer: Medicare Other | Attending: Family Medicine | Admitting: Family Medicine

## 2014-10-02 VITALS — BP 124/72 | HR 75 | Ht 70.5 in | Wt 206.1 lb

## 2014-10-02 DIAGNOSIS — I5042 Chronic combined systolic (congestive) and diastolic (congestive) heart failure: Secondary | ICD-10-CM | POA: Diagnosis present

## 2014-10-02 DIAGNOSIS — K5900 Constipation, unspecified: Secondary | ICD-10-CM | POA: Diagnosis present

## 2014-10-02 DIAGNOSIS — I2510 Atherosclerotic heart disease of native coronary artery without angina pectoris: Secondary | ICD-10-CM

## 2014-10-02 DIAGNOSIS — Z951 Presence of aortocoronary bypass graft: Secondary | ICD-10-CM | POA: Diagnosis not present

## 2014-10-02 DIAGNOSIS — I951 Orthostatic hypotension: Secondary | ICD-10-CM | POA: Diagnosis present

## 2014-10-02 DIAGNOSIS — R531 Weakness: Secondary | ICD-10-CM | POA: Diagnosis present

## 2014-10-02 DIAGNOSIS — R1310 Dysphagia, unspecified: Secondary | ICD-10-CM | POA: Diagnosis present

## 2014-10-02 DIAGNOSIS — N183 Chronic kidney disease, stage 3 unspecified: Secondary | ICD-10-CM | POA: Diagnosis present

## 2014-10-02 DIAGNOSIS — Z86718 Personal history of other venous thrombosis and embolism: Secondary | ICD-10-CM

## 2014-10-02 DIAGNOSIS — M1990 Unspecified osteoarthritis, unspecified site: Secondary | ICD-10-CM | POA: Diagnosis present

## 2014-10-02 DIAGNOSIS — Z85528 Personal history of other malignant neoplasm of kidney: Secondary | ICD-10-CM

## 2014-10-02 DIAGNOSIS — I4581 Long QT syndrome: Secondary | ICD-10-CM

## 2014-10-02 DIAGNOSIS — I252 Old myocardial infarction: Secondary | ICD-10-CM | POA: Diagnosis not present

## 2014-10-02 DIAGNOSIS — E039 Hypothyroidism, unspecified: Secondary | ICD-10-CM | POA: Diagnosis present

## 2014-10-02 DIAGNOSIS — I2119 ST elevation (STEMI) myocardial infarction involving other coronary artery of inferior wall: Secondary | ICD-10-CM | POA: Diagnosis present

## 2014-10-02 DIAGNOSIS — I214 Non-ST elevation (NSTEMI) myocardial infarction: Secondary | ICD-10-CM | POA: Diagnosis present

## 2014-10-02 DIAGNOSIS — C649 Malignant neoplasm of unspecified kidney, except renal pelvis: Secondary | ICD-10-CM | POA: Diagnosis present

## 2014-10-02 DIAGNOSIS — D494 Neoplasm of unspecified behavior of bladder: Secondary | ICD-10-CM

## 2014-10-02 DIAGNOSIS — F329 Major depressive disorder, single episode, unspecified: Secondary | ICD-10-CM | POA: Diagnosis present

## 2014-10-02 DIAGNOSIS — E860 Dehydration: Secondary | ICD-10-CM

## 2014-10-02 DIAGNOSIS — I5043 Acute on chronic combined systolic (congestive) and diastolic (congestive) heart failure: Secondary | ICD-10-CM

## 2014-10-02 DIAGNOSIS — Z905 Acquired absence of kidney: Secondary | ICD-10-CM | POA: Diagnosis present

## 2014-10-02 DIAGNOSIS — E876 Hypokalemia: Secondary | ICD-10-CM

## 2014-10-02 DIAGNOSIS — Z7982 Long term (current) use of aspirin: Secondary | ICD-10-CM

## 2014-10-02 DIAGNOSIS — R319 Hematuria, unspecified: Secondary | ICD-10-CM | POA: Diagnosis present

## 2014-10-02 DIAGNOSIS — Z8546 Personal history of malignant neoplasm of prostate: Secondary | ICD-10-CM | POA: Diagnosis not present

## 2014-10-02 DIAGNOSIS — Z8673 Personal history of transient ischemic attack (TIA), and cerebral infarction without residual deficits: Secondary | ICD-10-CM | POA: Diagnosis not present

## 2014-10-02 DIAGNOSIS — G4733 Obstructive sleep apnea (adult) (pediatric): Secondary | ICD-10-CM | POA: Diagnosis present

## 2014-10-02 DIAGNOSIS — I130 Hypertensive heart and chronic kidney disease with heart failure and stage 1 through stage 4 chronic kidney disease, or unspecified chronic kidney disease: Secondary | ICD-10-CM | POA: Diagnosis present

## 2014-10-02 DIAGNOSIS — N179 Acute kidney failure, unspecified: Secondary | ICD-10-CM | POA: Diagnosis not present

## 2014-10-02 DIAGNOSIS — E785 Hyperlipidemia, unspecified: Secondary | ICD-10-CM | POA: Diagnosis present

## 2014-10-02 DIAGNOSIS — Z79899 Other long term (current) drug therapy: Secondary | ICD-10-CM

## 2014-10-02 DIAGNOSIS — F0390 Unspecified dementia without behavioral disturbance: Secondary | ICD-10-CM | POA: Diagnosis present

## 2014-10-02 DIAGNOSIS — R42 Dizziness and giddiness: Secondary | ICD-10-CM

## 2014-10-02 DIAGNOSIS — K219 Gastro-esophageal reflux disease without esophagitis: Secondary | ICD-10-CM | POA: Diagnosis present

## 2014-10-02 DIAGNOSIS — R9431 Abnormal electrocardiogram [ECG] [EKG]: Secondary | ICD-10-CM

## 2014-10-02 DIAGNOSIS — N189 Chronic kidney disease, unspecified: Secondary | ICD-10-CM

## 2014-10-02 DIAGNOSIS — I824Z9 Acute embolism and thrombosis of unspecified deep veins of unspecified distal lower extremity: Secondary | ICD-10-CM | POA: Diagnosis present

## 2014-10-02 DIAGNOSIS — N400 Enlarged prostate without lower urinary tract symptoms: Secondary | ICD-10-CM | POA: Diagnosis present

## 2014-10-02 DIAGNOSIS — E119 Type 2 diabetes mellitus without complications: Secondary | ICD-10-CM | POA: Diagnosis present

## 2014-10-02 LAB — URINALYSIS, ROUTINE W REFLEX MICROSCOPIC
Bilirubin Urine: NEGATIVE
Glucose, UA: NEGATIVE mg/dL
Hgb urine dipstick: NEGATIVE
KETONES UR: NEGATIVE mg/dL
Leukocytes, UA: NEGATIVE
NITRITE: NEGATIVE
PH: 7 (ref 5.0–8.0)
PROTEIN: NEGATIVE mg/dL
Specific Gravity, Urine: 1.015 (ref 1.005–1.030)
Urobilinogen, UA: 1 mg/dL (ref 0.0–1.0)

## 2014-10-02 LAB — CBC WITH DIFFERENTIAL/PLATELET
BASOS ABS: 0 10*3/uL (ref 0.0–0.1)
Basophils Relative: 0 % (ref 0–1)
EOS ABS: 0.1 10*3/uL (ref 0.0–0.7)
Eosinophils Relative: 1 % (ref 0–5)
HCT: 39.5 % (ref 39.0–52.0)
HEMOGLOBIN: 12.5 g/dL — AB (ref 13.0–17.0)
LYMPHS ABS: 1.3 10*3/uL (ref 0.7–4.0)
LYMPHS PCT: 14 % (ref 12–46)
MCH: 26.8 pg (ref 26.0–34.0)
MCHC: 31.6 g/dL (ref 30.0–36.0)
MCV: 84.8 fL (ref 78.0–100.0)
MONOS PCT: 6 % (ref 3–12)
Monocytes Absolute: 0.5 10*3/uL (ref 0.1–1.0)
NEUTROS PCT: 79 % — AB (ref 43–77)
Neutro Abs: 7.3 10*3/uL (ref 1.7–7.7)
Platelets: 319 10*3/uL (ref 150–400)
RBC: 4.66 MIL/uL (ref 4.22–5.81)
RDW: 16.1 % — ABNORMAL HIGH (ref 11.5–15.5)
WBC: 9.2 10*3/uL (ref 4.0–10.5)

## 2014-10-02 LAB — I-STAT TROPONIN, ED: Troponin i, poc: 0.05 ng/mL (ref 0.00–0.08)

## 2014-10-02 LAB — COMPREHENSIVE METABOLIC PANEL
ALK PHOS: 95 U/L (ref 38–126)
ALT: 17 U/L (ref 17–63)
AST: 20 U/L (ref 15–41)
Albumin: 3.8 g/dL (ref 3.5–5.0)
Anion gap: 15 (ref 5–15)
BUN: 31 mg/dL — ABNORMAL HIGH (ref 6–20)
CO2: 37 mmol/L — ABNORMAL HIGH (ref 22–32)
Calcium: 9.7 mg/dL (ref 8.9–10.3)
Chloride: 86 mmol/L — ABNORMAL LOW (ref 101–111)
Creatinine, Ser: 2.62 mg/dL — ABNORMAL HIGH (ref 0.61–1.24)
GFR calc non Af Amer: 22 mL/min — ABNORMAL LOW (ref >60)
GFR, EST AFRICAN AMERICAN: 25 mL/min — AB (ref >60)
Glucose, Bld: 199 mg/dL — ABNORMAL HIGH (ref 65–99)
POTASSIUM: 2.6 mmol/L — AB (ref 3.5–5.1)
SODIUM: 138 mmol/L (ref 135–145)
TOTAL PROTEIN: 8.1 g/dL (ref 6.5–8.1)
Total Bilirubin: 0.8 mg/dL (ref 0.3–1.2)

## 2014-10-02 LAB — MAGNESIUM: Magnesium: 2.1 mg/dL (ref 1.7–2.4)

## 2014-10-02 LAB — PROTIME-INR
INR: 1.16 (ref 0.00–1.49)
Prothrombin Time: 15 seconds (ref 11.6–15.2)

## 2014-10-02 MED ORDER — ZIPRASIDONE HCL 20 MG PO CAPS
20.0000 mg | ORAL_CAPSULE | Freq: Two times a day (BID) | ORAL | Status: DC
  Administered 2014-10-03: 20 mg via ORAL
  Filled 2014-10-02 (×3): qty 1

## 2014-10-02 MED ORDER — SODIUM CHLORIDE 0.9 % IV SOLN
INTRAVENOUS | Status: DC
Start: 2014-10-02 — End: 2014-10-03
  Administered 2014-10-02: 50 mL/h via INTRAVENOUS

## 2014-10-02 MED ORDER — SODIUM CHLORIDE 0.9 % IJ SOLN
3.0000 mL | Freq: Two times a day (BID) | INTRAMUSCULAR | Status: DC
Start: 2014-10-02 — End: 2014-10-04
  Administered 2014-10-02 – 2014-10-03 (×2): 3 mL via INTRAVENOUS

## 2014-10-02 MED ORDER — LEVOTHYROXINE SODIUM 125 MCG PO TABS
125.0000 ug | ORAL_TABLET | Freq: Every day | ORAL | Status: DC
Start: 2014-10-03 — End: 2014-10-04
  Administered 2014-10-03 – 2014-10-04 (×2): 125 ug via ORAL
  Filled 2014-10-02 (×3): qty 1

## 2014-10-02 MED ORDER — ESCITALOPRAM OXALATE 10 MG PO TABS
10.0000 mg | ORAL_TABLET | Freq: Every day | ORAL | Status: DC
Start: 2014-10-03 — End: 2014-10-04
  Administered 2014-10-03 – 2014-10-04 (×2): 10 mg via ORAL
  Filled 2014-10-02 (×2): qty 1

## 2014-10-02 MED ORDER — ASPIRIN 81 MG PO CHEW
81.0000 mg | CHEWABLE_TABLET | Freq: Every day | ORAL | Status: DC
  Administered 2014-10-03 – 2014-10-04 (×2): 81 mg via ORAL
  Filled 2014-10-02 (×2): qty 1

## 2014-10-02 MED ORDER — POLYETHYLENE GLYCOL 3350 17 G PO PACK
17.0000 g | PACK | Freq: Every day | ORAL | Status: DC
  Administered 2014-10-02 – 2014-10-03 (×2): 17 g via ORAL
  Filled 2014-10-02 (×3): qty 1

## 2014-10-02 MED ORDER — FLUDROCORTISONE ACETATE 0.1 MG PO TABS
0.2000 mg | ORAL_TABLET | Freq: Every day | ORAL | Status: DC
  Administered 2014-10-03 – 2014-10-04 (×2): 0.2 mg via ORAL
  Filled 2014-10-02 (×3): qty 2

## 2014-10-02 MED ORDER — SODIUM CHLORIDE 0.9 % IV SOLN
INTRAVENOUS | Status: DC
  Administered 2014-10-02: 23:00:00 via INTRAVENOUS

## 2014-10-02 MED ORDER — POTASSIUM CHLORIDE CRYS ER 20 MEQ PO TBCR
40.0000 meq | EXTENDED_RELEASE_TABLET | Freq: Two times a day (BID) | ORAL | Status: DC
  Administered 2014-10-02 – 2014-10-04 (×4): 40 meq via ORAL
  Filled 2014-10-02 (×5): qty 2

## 2014-10-02 MED ORDER — SENNOSIDES-DOCUSATE SODIUM 8.6-50 MG PO TABS: 1.0000 | ORAL_TABLET | Freq: Every evening | ORAL | Status: DC | PRN

## 2014-10-02 MED ORDER — POTASSIUM CHLORIDE CRYS ER 20 MEQ PO TBCR
60.0000 meq | EXTENDED_RELEASE_TABLET | Freq: Once | ORAL | Status: AC
  Administered 2014-10-02: 60 meq via ORAL
  Filled 2014-10-02: qty 3

## 2014-10-02 MED ORDER — PANTOPRAZOLE SODIUM 40 MG PO TBEC
40.0000 mg | DELAYED_RELEASE_TABLET | Freq: Every morning | ORAL | Status: DC
  Administered 2014-10-03 – 2014-10-04 (×2): 40 mg via ORAL
  Filled 2014-10-02 (×2): qty 1

## 2014-10-02 MED ORDER — DONEPEZIL HCL 10 MG PO TABS
10.0000 mg | ORAL_TABLET | Freq: Every day | ORAL | Status: DC
  Administered 2014-10-02 – 2014-10-03 (×2): 10 mg via ORAL
  Filled 2014-10-02 (×3): qty 1

## 2014-10-02 MED ORDER — MIDODRINE HCL 5 MG PO TABS
10.0000 mg | ORAL_TABLET | Freq: Two times a day (BID) | ORAL | Status: DC
  Administered 2014-10-03 – 2014-10-04 (×3): 10 mg via ORAL
  Filled 2014-10-02 (×5): qty 2

## 2014-10-02 NOTE — ED Provider Notes (Signed)
CSN: [id number]     Arrival date & time 10/02/14  1601 History   First MD Initiated Contact with Patient 10/02/14 2024     Chief Complaint  Patient presents with  . Weakness  . Fall  . Abnormal ECG     (Consider location/radiation/quality/duration/timing/severity/associated sxs/prior Treatment) HPI Comments: 81 y.o. male with history of chronic combined CHF, CAD (STEMI s/p CABG 12/2013), OSA on CPAP, dementia, HLD (intolerant of statins), CKD stage III s/p nephrectomy, h/o bladder/prostate CA, DVT - not on anticoagulant due to hematuria comes in with cc of abnormal labs. Pt is seeing Cardiologists for presurgery clearance and management of CHF. PT was switched on his lasix recently due to leg swelling, and he reports 12 lb weight loss and improved swelling. However, he went to the clinic today and was noted to have Cr elevation and sent to the ER for optimization. Pt reports slight increased weakness, dizziness today, and he had a fall earlier. Pt unsure if he fainted. He denies any fevers, headaches, palpitations, worsening shortness of breath, chest pains.   ROS 10 Systems reviewed and are negative for acute change except as noted in the HPI.     Patient is a 81 y.o. male presenting with weakness and fall. The history is provided by the patient.  Weakness  Fall    Past Medical History  Diagnosis Date  . Chronic constipation   . GERD (gastroesophageal reflux disease)   . Arthritis   . Hyperlipidemia   . Orthostatic hypotension 11/19/2013  . Coronary artery disease     a. 12/2013 - STEMI s/p CABGx4.  . Mild dementia   . Type 2 diabetes mellitus   . Hypothyroidism   . History of esophageal dilatation   . History of gastritis   . History of hiatal hernia   . History of bladder cancer     hx  TCC low grade  . Prostate cancer     low-grade  . Kidney tumor     right  . BPH (benign prostatic hyperplasia)   . CKD (chronic kidney disease) stage 3, GFR 30-59 ml/min     a. h/o  nephrectomy.  [name] History of kidney stones   . ED (erectile dysfunction)   . S/P CABG x 4     12-18-2013  . Bladder tumor   . Complication of anesthesia     HARD TO WAKE  . OSA on CPAP     mild osa   per sleep study 07-11-2009- patient states does not use regularly  . DVT (deep venous thrombosis)     a. s/p IVC filter 09/2014. Xarelto stopped at that time due to hematuria and hemoccult + stools.  . Myocardial infarction 12/2013  . Stroke     TIA  . Chronic combined systolic and diastolic CHF (congestive heart failure)     systolic and diastolic heart failure  . Headache   . Depression   . Hematuria   . Heme positive stool   . Anemia    Past Surgical History  Procedure Laterality Date  . Upper gastrointestinal endoscopy  04/06/2010  &  01-08-2013    w/ esophageal dilatation  . Hemorrhoid surgery    . Hand surgery      right  . Wrist fracture surgery      pins placed-left  . Cystoscopy with biopsy  01/09/2012    Procedure: CYSTOSCOPY WITH BIOPSY;  Surgeon: Marissa Nestle, MD;  Location: [facility];  Service: Urology;  Laterality: N/A;  Cystoscopy with Multiple Bladder Biopsies  . Esophagogastroduodenoscopy (egd) with esophageal dilation N/A 01/08/2013    Procedure: ESOPHAGOGASTRODUODENOSCOPY (EGD) WITH ESOPHAGEAL DILATION;  Surgeon: Rogene Houston, MD;  [facility];  Service: Endoscopy;  Laterality: N/A;  120  . Left heart cath N/A 12/17/2013    Procedure: LEFT HEART CATH;  Surgeon: Leonie Man, MD;  Location: [facility] CATH LAB;  Service: Cardiovascular;  Laterality: N/A;  . Left heart catheterization with coronary angiogram N/A 12/17/2013    Procedure: LEFT HEART CATHETERIZATION WITH CORONARY ANGIOGRAM;  Surgeon: Leonie Man, MD;  Location: [facility] CATH LAB;  Service: Cardiovascular;  Laterality: N/A;  . Colonoscopy with esophagogastroduodenoscopy (egd)  08-15-2006    w/ esophageal dilatation  . Umbilical hernia repair  04-24-2003  . Transurethral resection of bladder tumor   06-14-2004  . Transurethral resection of prostate  10-04-2004  . Cysto/  removal bladder calculus/  resection bladder tumor  10-16-2006  . Cystostomy w/ bladder biopsy  01-09-2012  . Cardiac catheterization  12-17-2013   dr Shanon Brow harding    severe disease RCA with thrombotic appearing subtotal occlusions/  95-99%  OM2/  diffuse disease LAD  and D1/  mild to moderate basal to mid inferior hypokinesis/  severe elevated LVEDP/  ef 45-50%  . Coronary artery bypass graft N/A 12/18/2013    Procedure: Coronary artery bypass grafting times four using left internal mammary artery and endoscopically harvested right saphenous vein graft;  Surgeon: Gaye Pollack, MD;  Location: [facility];  Service: Open Heart Surgery;  Laterality: N/A;  . Transthoracic echocardiogram  11-14-2012    grade I diastolic dysfunction/  ef 55-60%/  trivial MR  and TR  . Cardiovascular stress test  08-09-2009  dr berry    mild to moderate perfusion defect in basal , mid, and apical inferior regions consistent with an infart/scar/   No evidence ischemia/  ef 51%/  no ECG changes/   low risk scan  . Transurethral resection of bladder tumor N/A 04/07/2014    Procedure: TRANSURETHRAL RESECTION OF BLADDER TUMOR (TURBT);  Surgeon: Arvil Persons, MD;  Location: [facility];  Service: Urology;  Laterality: N/A;  . Cystoscopy/retrograde/ureteroscopy Right 04/07/2014    Procedure: CYSTOSCOPY/RETROGRADE/URETEROSCOPY WITH BIOPSY OF RENAL PELVIS TUMOR;  Surgeon: Arvil Persons, MD;  Location: [facility];  Service: Urology;  Laterality: Right;  . Robot assisted laparoscopic nephrectomy Right 06/05/2014    Procedure: ROBOTIC ASSISTED LAPAROSCOPIC RIGHT NEPHROURETERECTOMY, VENTRAL HERNIA REPAIR ;  Surgeon: Ardis Hughs, MD;  Location: [facility];  Service: Urology;  Laterality: Right;  . Cystoscopy w/ ureteral stent placement Right 06/05/2014    Procedure: CYSTOSCOPY ;  Surgeon: Ardis Hughs, MD;  Location: [facility];   Service: Urology;  Laterality: Right;  . Radiology with anesthesia N/A 06/22/2014    Procedure: MRI BRAIN W/O CONTRAST;  Surgeon: Medication Radiologist, MD;  Location: [place];  Service: Radiology;  Laterality: N/A;  . Eye surgery      cataract surgery  . Hernia repair  24/2683    umbilical hernia repair at time of nephrectomy   Family History  Problem Relation Age of Onset  . Cancer - Lung Brother   . Heart disease Mother   . Heart attack Mother    History  Substance Use Topics  . Smoking status: Former Smoker -- 1.00 packs/day for 4 years    Types: Cigarettes    Start date: 09/08/1952    Quit date: 09/09/1959  .  Smokeless tobacco: Former Systems developer    Types: [place] date: 01/03/1960  . Alcohol Use: No    Review of Systems  Neurological: Positive for weakness and light-headedness.  All other systems reviewed and are negative.     Allergies  Lorazepam and Statins  Home Medications   Prior to Admission medications   Medication Sig Start Date End Date Taking? Authorizing Provider  aspirin 81 MG chewable tablet Chew 1 tablet (81 mg total) by mouth daily. 08/20/14  Yes Lezlie Octave Black, NP  donepezil (ARICEPT) 10 MG tablet Take 10 mg by mouth at bedtime.   Yes Historical Provider, MD  escitalopram (LEXAPRO) 20 MG tablet Take 20 mg by mouth daily.   Yes Historical Provider, MD  feeding supplement, ENSURE ENLIVE, (ENSURE ENLIVE) LIQD Take 237 mLs by mouth 3 (three) times daily between meals. 08/20/14  Yes Lezlie Octave Black, NP  fludrocortisone (FLORINEF) 0.1 MG tablet Take 0.2 mg by mouth daily.    Yes Historical Provider, MD  levothyroxine (SYNTHROID, LEVOTHROID) 125 MCG tablet Take 125 mcg by mouth daily before breakfast.   Yes Historical Provider, MD  midodrine (PROAMATINE) 10 MG tablet Take 10 mg by mouth 2 (two) times daily.   Yes Historical Provider, MD  pantoprazole (PROTONIX) 40 MG tablet TAKE ONE TABLET EVERY MORNING 04/06/14  Yes Butch Penny, NP  polyethylene glycol  (MIRALAX / GLYCOLAX) packet Take 17 g by mouth at bedtime.   Yes Historical Provider, MD  torsemide (DEMADEX) 20 MG tablet Take 1 tablet (20 mg total) by mouth 2 (two) times daily. 09/29/14  Yes Herminio Commons, MD  ziprasidone (GEODON) 20 MG capsule Take 1 capsule (20 mg total) by mouth 2 (two) times daily with a meal. 06/22/14  Yes Maryann Mikhail, DO   BP 143/85 mmHg  Pulse 37  Temp(Src) 98.2 F (36.8 C) (Oral)  Resp 18  Ht 5' 10.5" (1.791 m)  Wt 206 lb 12.8 oz (93.804 kg)  BMI 29.24 kg/m2  SpO2 97% Physical Exam  Constitutional: He is oriented to person, place, and time. He appears well-developed.  HENT:  Head: Normocephalic and atraumatic.  Eyes: Conjunctivae and EOM are normal. Pupils are equal, round, and reactive to light.  Neck: Normal range of motion. Neck supple.  Cardiovascular: Normal rate and regular rhythm.   Pulmonary/Chest: Effort normal and breath sounds normal. No respiratory distress. He has no wheezes.  Abdominal: Soft. Bowel sounds are normal. He exhibits no distension.  Neurological: He is alert and oriented to person, place, and time. No cranial nerve deficit.  Skin: Skin is warm.  Nursing note and vitals reviewed.   ED Course  Procedures (including critical care time) Labs Review Labs Reviewed  CBC WITH DIFFERENTIAL/PLATELET - Abnormal; Notable for the following:    Hemoglobin 12.5 (*)    RDW 16.1 (*)    Neutrophils Relative % 79 (*)    All other components within normal limits  COMPREHENSIVE METABOLIC PANEL - Abnormal; Notable for the following:    Potassium 2.6 (*)    Chloride 86 (*)    CO2 37 (*)    Glucose, Bld 199 (*)    BUN 31 (*)    Creatinine, Ser 2.62 (*)    GFR calc non Af Amer 22 (*)    GFR calc Af Amer 25 (*)    All other components within normal limits  PROTIME-INR  MAGNESIUM  URINALYSIS, ROUTINE W REFLEX MICROSCOPIC (NOT AT [facility])  MAGNESIUM  I-STAT TROPOININ, ED  Imaging Review No results found.   EKG  Interpretation   Date/Time:  Friday October 02 2014 21:34:49 EDT Ventricular Rate:  88 PR Interval:  268 QRS Duration: 111 QT Interval:  512 QTC Calculation: 620 R Axis:   90 Text Interpretation:  Sinus rhythm Paired ventricular premature complexes  Prolonged PR interval Inferior infarct, old Prolonged QT interval - new  intervals than previous ekg Confirmed by Kathrynn Humble, MD, Thelma Comp [phone]) on  10/02/2014 10:19:19 PM      MDM   Final diagnoses:  Acute on chronic kidney failure  Dehydration  Orthostatic dizziness  Prolonged QT syndrome  Hypokalemia    Pt comes in with cc of abnormal labs. He has AKI, and he has only 1 kidney - so we will admit him for optimization. The AKI appears to be from aggressive diuresis, and so we will give the patient gentle hydration to help with the likely prerenal cause. Pt's labs also show hypokalemia and he has prolonged QTc - will give oral K. Pt will need telemetry for the prolonged Qtc. Mag ordered.  Will admit to medicine.     Varney Biles, MD 10/02/14 2221

## 2014-10-02 NOTE — Patient Instructions (Addendum)
Medication Instructions:   Your physician recommends that you continue on your current medications as directed. Please refer to the Current Medication list given to you today.  Labwork:   Testing/Procedures:   Follow-Up:  IN ONE WEEK WITH DR Bronson Ing    Any Other Special Instructions Will Be Listed Below (If Applicable).  YOU HAVE BEEN ADVISED BY  Melina Copa ,PA to go to [place]

## 2014-10-02 NOTE — Progress Notes (Signed)
Cardiology Office Note Date:  10/02/2014  Patient ID:  [name] [name], DOB [birth year], MRN [id number] PCP:  Purvis Kilts, MD  Cardiologist: Dr. Bronson Ing  Chief Complaint: SOB, weight gain  History of Present Illness: [name] [name] is a 81 y.o. male with history of chronic combined CHF, CAD (STEMI s/p CABG 12/2013), OSA on CPAP, dementia, HLD (intolerant of statins), CKD stage III s/p nephrectomy, h/o bladder/prostate CA, BPH, CVA, known hematuria from bladder cancer, LE who presents for follow-up. He recently began to have hematuria and was found to have recurrent bladder tumors. IVC filter was placed 09/17/14. He was admitted 6/10-6/11 with post-procedural bleeding, hematuria, and hemoccult positive stools. Hgb was 10. His heme positive stool was not felt to represent a significant bleed and further workup was deferred to his PCP. He ws taken off Xarelto pending investigation of his bladder cancer. It was felt he could be on aspirin though. Dr. Bronson Ing saw him on 09/29/14 with worsening dyspnea, abdominal distention and leg swelling. Weight was up 12 lbs. Lasix was changed to torsemide 20mg  BID with addition of metolazone 2.5mg  daily. It was felt that if he reamined decompensated, hospitalization may be necessary. It was felt that he would be very high risk for a major adverse cardiac event for any type of surgery with his multiple medical problems. Last echo 08/2014 showed EF 40-45%, +DD, mild WMA, mod MR, mod-severely reduced RV function, mild-mod TR, mod elevated PASP, dilated IVC. Last BMET on 09/29/14 showed K 3.4, BUN/Cr 28/2.39 (baseline Cr appears 1.8), Hgb 11.2 (stable from the 9 range). He is supposed to have TURP on Monday.  He comes back to clinic today reporting that although his breathing and edema are better, he feels "terrible." He reports significant nausea, headache, dizziness, and weakness. He has an emesis bag with him. He's fallen twice today and hit his head. He reports an  episode of chest pain and SOB that lasted about an hour earlier but is a poor historian and unable to provide more specific details. He was sitting when it happened. Not worse with inspiration or palpation. He does not really exert himself.   Past Medical History  Diagnosis Date  . Chronic constipation   . GERD (gastroesophageal reflux disease)   . Arthritis   . Hyperlipidemia   . Orthostatic hypotension 11/19/2013  . Coronary artery disease     a. 12/2013 - STEMI s/p CABGx4.  . Mild dementia   . Type 2 diabetes mellitus   . Hypothyroidism   . History of esophageal dilatation   . History of gastritis   . History of hiatal hernia   . History of bladder cancer     hx  TCC low grade  . Prostate cancer     low-grade  . Kidney tumor     right  . BPH (benign prostatic hyperplasia)   . CKD (chronic kidney disease) stage 3, GFR 30-59 ml/min     a. h/o nephrectomy.  [name] History of kidney stones   . ED (erectile dysfunction)   . S/P CABG x 4     12-18-2013  . Bladder tumor   . Complication of anesthesia     HARD TO WAKE  . OSA on CPAP     mild osa   per sleep study 07-11-2009- patient states does not use regularly  . DVT (deep venous thrombosis)     a. s/p IVC filter 09/2014. Xarelto stopped at that time due to hematuria and hemoccult +  stools.  . Myocardial infarction 12/2013  . Stroke     TIA  . Chronic combined systolic and diastolic CHF (congestive heart failure)     systolic and diastolic heart failure  . Headache   . Depression   . Hematuria   . Heme positive stool   . Anemia     Past Surgical History  Procedure Laterality Date  . Upper gastrointestinal endoscopy  04/06/2010  &  01-08-2013    w/ esophageal dilatation  . Hemorrhoid surgery    . Hand surgery      right  . Wrist fracture surgery      pins placed-left  . Cystoscopy with biopsy  01/09/2012    Procedure: CYSTOSCOPY WITH BIOPSY;  Surgeon: Marissa Nestle, MD;  Location: [facility];  Service: Urology;   Laterality: N/A;  Cystoscopy with Multiple Bladder Biopsies  . Esophagogastroduodenoscopy (egd) with esophageal dilation N/A 01/08/2013    Procedure: ESOPHAGOGASTRODUODENOSCOPY (EGD) WITH ESOPHAGEAL DILATION;  Surgeon: Rogene Houston, MD;  Location: [facility];  Service: Endoscopy;  Laterality: N/A;  120  . Left heart cath N/A 12/17/2013    Procedure: LEFT HEART CATH;  Surgeon: Leonie Man, MD;  Location[facility] CATH LAB;  Service: Cardiovascular;  Laterality: N/A;  . Left heart catheterization with coronary angiogram N/A 12/17/2013    Procedure: LEFT HEART CATHETERIZATION WITH CORONARY ANGIOGRAM;  Surgeon: Leonie Man, MD;  Location: [facility] CATH LAB;  Service: Cardiovascular;  Laterality: N/A;  . Colonoscopy with esophagogastroduodenoscopy (egd)  08-15-2006    w/ esophageal dilatation  . Umbilical hernia repair  04-24-2003  . Transurethral resection of bladder tumor  06-14-2004  . Transurethral resection of prostate  10-04-2004  . Cysto/  removal bladder calculus/  resection bladder tumor  10-16-2006  . Cystostomy w/ bladder biopsy  01-09-2012  . Cardiac catheterization  12-17-2013   dr Shanon Brow harding    severe disease RCA with thrombotic appearing subtotal occlusions/  95-99%  OM2/  diffuse disease LAD  and D1/  mild to moderate basal to mid inferior hypokinesis/  severe elevated LVEDP/  ef 45-50%  . Coronary artery bypass graft N/A 12/18/2013    Procedure: Coronary artery bypass grafting times four using left internal mammary artery and endoscopically harvested right saphenous vein graft;  Surgeon: Gaye Pollack, MD;  Location: [facility] OR;  Service: Open Heart Surgery;  Laterality: N/A;  . Transthoracic echocardiogram  11-14-2012    grade I diastolic dysfunction/  ef 55-60%/  trivial MR  and TR  . Cardiovascular stress test  08-09-2009  dr berry    mild to moderate perfusion defect in basal , mid, and apical inferior regions consistent with an infart/scar/   No evidence ischemia/  ef 51%/  no ECG  changes/   low risk scan  . Transurethral resection of bladder tumor N/A 04/07/2014    Procedure: TRANSURETHRAL RESECTION OF BLADDER TUMOR (TURBT);  Surgeon: Arvil Persons, MD;  Location: [facility];  Service: Urology;  Laterality: N/A;  . Cystoscopy/retrograde/ureteroscopy Right 04/07/2014    Procedure: CYSTOSCOPY/RETROGRADE/URETEROSCOPY WITH BIOPSY OF RENAL PELVIS TUMOR;  Surgeon: Arvil Persons, MD;  Location: [facility];  Service: Urology;  Laterality: Right;  . Robot assisted laparoscopic nephrectomy Right 06/05/2014    Procedure: ROBOTIC ASSISTED LAPAROSCOPIC RIGHT NEPHROURETERECTOMY, VENTRAL HERNIA REPAIR ;  Surgeon: Ardis Hughs, MD;  Location: [facility] ORS;  Service: Urology;  Laterality: Right;  . Cystoscopy w/ ureteral stent placement Right 06/05/2014    Procedure:  CYSTOSCOPY ;  Surgeon: Ardis Hughs, MD;  Location: [facility];  Service: Urology;  Laterality: Right;  . Radiology with anesthesia N/A 06/22/2014    Procedure: MRI BRAIN W/O CONTRAST;  Surgeon: Medication Radiologist, MD;  Location: [facility];  Service: Radiology;  Laterality: N/A;  . Eye surgery      cataract surgery  . Hernia repair  32/2025    umbilical hernia repair at time of nephrectomy    Current Outpatient Prescriptions  Medication Sig Dispense Refill  . aspirin 81 MG chewable tablet Chew 1 tablet (81 mg total) by mouth daily.    [name] donepezil (ARICEPT) 10 MG tablet Take 10 mg by mouth at bedtime.    [name] escitalopram (LEXAPRO) 20 MG tablet Take 20 mg by mouth daily.    . feeding supplement, ENSURE ENLIVE, (ENSURE ENLIVE) LIQD Take 237 mLs by mouth 3 (three) times daily between meals. 237 mL 12  . fludrocortisone (FLORINEF) 0.1 MG tablet Take 0.2 mg by mouth daily.     [name] levothyroxine (SYNTHROID, LEVOTHROID) 125 MCG tablet Take 125 mcg by mouth daily before breakfast.    . midodrine (PROAMATINE) 10 MG tablet Take 10 mg by mouth 2 (two) times daily.    . pantoprazole (PROTONIX) 40 MG tablet  TAKE ONE TABLET EVERY MORNING 30 tablet 4  . polyethylene glycol (MIRALAX / GLYCOLAX) packet Take 17 g by mouth at bedtime.    . torsemide (DEMADEX) 20 MG tablet Take 1 tablet (20 mg total) by mouth 2 (two) times daily. 180 tablet 3  . ziprasidone (GEODON) 20 MG capsule Take 1 capsule (20 mg total) by mouth 2 (two) times daily with a meal. 60 capsule 0   No current facility-administered medications for this visit.    Allergies:   Lorazepam and Statins   Social History:  The patient  reports that he quit smoking about 55 years ago. His smoking use included Cigarettes. He started smoking about 62 years ago. He has a 4 pack-year smoking history. He quit smokeless tobacco use about 54 years ago. His smokeless tobacco use included Chew. He reports that he does not drink alcohol or use illicit drugs.   Family History:  The patient's family history includes Cancer - Lung in his brother; Heart attack in his mother; Heart disease in his mother.  ROS:  Please see the history of present illness.  All other systems are reviewed and otherwise negative.   PHYSICAL EXAM:  VS:  BP 124/72 mmHg  Pulse 75  Ht 5' 10.5" (1.791 m)  Wt 206 lb 1.9 oz (93.495 kg)  BMI 29.15 kg/m2 BMI: Body mass index is 29.15 kg/(m^2). Well nourished, well developed elderly WM, in no acute distress HEENT: normocephalic, atraumatic Neck: no JVD, carotid bruits or masses Cardiac:  normal S1, S2; RRR; no murmurs, rubs, or gallops Lungs:  clear to auscultation bilaterally, no wheezing, rhonchi or rales Abd: soft, nontender, no hepatomegaly, + BS MS: no deformity or atrophy Ext: no edema Skin: warm and dry, no rash Neuro:  moves all extremities spontaneously, no focal abnormalities noted, follows commands Psych: euthymic mood, full affect   EKG:  Done today shows NSR 71bpm, possible LAE, rightward axis, possible inferior infarct, inferior TWI as well as V2-V6 with QTc 524ms. Prior QTc was 566 on 09/18/14 EKG.  Recent  Labs: 08/17/2014: TSH 5.346* 09/18/2014: B Natriuretic Peptide 2240.0*; Magnesium 2.3 09/19/2014: ALT 9* 09/29/2014: BUN 28*; Creat 2.39*; Hemoglobin 11.2*; Platelets 291; Potassium 3.4*; Sodium 143  No results found  for requested labs within last 365 days.   Estimated Creatinine Clearance: 29.5 mL/min (by C-G formula based on Cr of 2.39).   Wt Readings from Last 3 Encounters:  10/02/14 206 lb 1.9 oz (93.495 kg)  09/30/14 219 lb 3.2 oz (99.428 kg)  09/29/14 229 lb (103.874 kg)     Other studies reviewed: Additional studies/records reviewed today include: summarized above  ASSESSMENT AND PLAN:  1. Nausea, dizziness, headache, falls - I am concerned for worsened renal failure +/- hypokalemia given recent AKI with Cr of 2.4 with recent diuretic titration and metolazone use. He is clinically improved from CHF standpoint but I am concerned about his symptom complex. I do not feel with his QT prolongation, symptoms and medical history that he can safely be evaluated in the office. I have advised he proceed to ER for further evaluation. Would recommend medicine admission given multitude of medical problems and we can consult if needed. 2. Acute on chronic combined CHF - clinically improved but now I am worried he is too dry. Weight is down 13 lbs from 2 days ago - not sure if this is accurate but his clinical symptoms are concerning for dehydration. To ER for evaluation. 3. CKD stage III with recently elevated Cr above baseline and hypokalemia - see above. 4. H/o DVT - no longer on Xarelto due to hematuria. S/p IVC filter. 5. Bladder tumor with recent hematuria, and heme + stools - was due to have surgery Monday but the timing may need to be reconsidered based on above presentation. 6. CAD s/p CABG 12/2013 - continue aspirin as tolerated. 7. Persistently prolonged QTc - will need BMET, Mg checked and may need holding of any QT prolonging medications.  Disposition: F/u with Dr. Bronson Ing in 1 week  depending on ER result.  Current medicines are reviewed at length with the patient today.  The patient did not have any concerns regarding medicines.  Raechel Ache PA-C 10/02/2014 3:24 PM     [address] [place] [address] [phone] (office)  [phone] (fax)

## 2014-10-02 NOTE — ED Notes (Signed)
Pt coming from [facility] for further evaluation. Sharrell Ku was the provider that saw the patient today. Pt is scheduled for bladder surgery on Monday. Pt's family reports increase in weakness and falls x 3 today. Dunn also wants pt to evaluated due to decrease in kidney function from labs results on 6/21. Pt denies CP, shortness of breath.

## 2014-10-02 NOTE — ED Notes (Signed)
MD at the bedside  

## 2014-10-02 NOTE — H&P (Addendum)
[facility] History and Physical  [name] [name] UXL:[id number] DOB: 1934/01/19 DOA: 10/02/2014  Referring physician: ED PCP: Purvis Kilts, MD  Specialists: Cardiology  Chief Complaint: AKI, QTC prolongationa nd sent from Cardiology office  HPI:  81 y/o ? Combined CHF NYHA 3 CAD c STEMI  c CABG x 4 -grafting from R leg 12/2013 Dr. Cyndia Bent, Chr GERD + Solid food dysphagia + dilatation 01/08/13 foll by Dr. 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 Louis Meckel 06/05/14  Last ECHO=. LVEF by echo on 08/17/14 40-45%, with moderate to severely reduced RV systolic function. Orthostatic hypotension on Meds Dry wght 217lb Patient fractured his right ankle and developed a DVT and then It appears like patient was admitted 08/17/2014 with a AE CHF, At some point started having hematuria and because of him being on a blood thinner decision was made for him to have an IVC filter placed 09/17/2014 He was subsequently readmitted on 6/10 after that procedure for postprocedural bleedinganticoagulations held.    I took care of him transiently earlier this year for toxic metabolic encephalopathy that took a long time to clear and psychiatry was involved in his care and placed him on Geodon.  He represented today to cardiology office because of preop clearance for bladder tumor removal-it appears that he had been placed on diarrhetic's at higher doses since his earlier visit by cardiology this week 09/29/14 and was placed on Bumex 20 daily as well as metolazone for 3 days in an effort to offload fluid   Potassium 2.6, BN/currently 31/2.6, glucose 199, LFTs normal, troponin 0.05, hemoglobin 12.5, platelets 319, W BC 9.2, EKG done this morning at cardiology office = PR interval 0.02 QRS axis 100 ST T wave inversions across precordium that are not new QT prolongation noted  584 ms   Review of Systems:  Actually looks great. Patient eating [place] sandwich and having a Sprite at the bedside. Completely currently alert Daughter/relative states that patient had a full breakfast this morning and scripts but has not eaten nothing since. Patient however felt a little nauseous earlier today. Early this morning history of fall without LOC. Patient does not remember what happened No focal other issues Denies at present chest pain however says that he had chest pain this morning. -7 fever, some chills, and since sputum, - blurred vision, some double vision, and some unilateral weakness, and time seizure, - abdominal pain, - melana - hematuria, - shortness of breath  Past Medical History  Diagnosis Date  . Chronic constipation   . GERD (gastroesophageal reflux disease)   . Arthritis   . Hyperlipidemia   . Orthostatic hypotension 11/19/2013  . Coronary artery disease     a. 12/2013 - STEMI s/p CABGx4.  . Mild dementia   . Type 2 diabetes mellitus   . Hypothyroidism   . History of esophageal dilatation   . History of gastritis   . History of hiatal hernia   . History of bladder cancer     hx  TCC low grade  . Prostate cancer     low-grade  . Kidney tumor     right  . BPH (benign prostatic hyperplasia)   . CKD (chronic kidney disease) stage 3, GFR 30-59 ml/min     a. h/o nephrectomy.  [name] History of kidney stones   . ED (erectile dysfunction)   .  S/P CABG x 4     12-18-2013  . Bladder tumor   . Complication of anesthesia     HARD TO WAKE  . OSA on CPAP     mild osa   per sleep study 07-11-2009- patient states does not use regularly  . DVT (deep venous thrombosis)     a. s/p IVC filter 09/2014. Xarelto stopped at that time due to hematuria and hemoccult + stools.  . Myocardial infarction 12/2013  . Stroke     TIA  . Chronic combined systolic and diastolic CHF (congestive heart failure)     systolic and diastolic heart failure  . Headache   . Depression    . Hematuria   . Heme positive stool   . Anemia    Past Surgical History  Procedure Laterality Date  . Upper gastrointestinal endoscopy  04/06/2010  &  01-08-2013    w/ esophageal dilatation  . Hemorrhoid surgery    . Hand surgery      right  . Wrist fracture surgery      pins placed-left  . Cystoscopy with biopsy  01/09/2012    Procedure: CYSTOSCOPY WITH BIOPSY;  Surgeon: Marissa Nestle, MD;  Location: [facility];  Service: Urology;  Laterality: N/A;  Cystoscopy with Multiple Bladder Biopsies  . Esophagogastroduodenoscopy (egd) with esophageal dilation N/A 01/08/2013    Procedure: ESOPHAGOGASTRODUODENOSCOPY (EGD) WITH ESOPHAGEAL DILATION;  Surgeon: Rogene Houston, MD;  Location: [facility] SUITE;  Service: Endoscopy;  Laterality: N/A;  120  . Left heart cath N/A 12/17/2013    Procedure: LEFT HEART CATH;  Surgeon: Leonie Man, MD;  Location: [facility] CATH LAB;  Service: Cardiovascular;  Laterality: N/A;  . Left heart catheterization with coronary angiogram N/A 12/17/2013    Procedure: LEFT HEART CATHETERIZATION WITH CORONARY ANGIOGRAM;  Surgeon: Leonie Man, MD;  Location: [facility] CATH LAB;  Service: Cardiovascular;  Laterality: N/A;  . Colonoscopy with esophagogastroduodenoscopy (egd)  08-15-2006    w/ esophageal dilatation  . Umbilical hernia repair  04-24-2003  . Transurethral resection of bladder tumor  06-14-2004  . Transurethral resection of prostate  10-04-2004  . Cysto/  removal bladder calculus/  resection bladder tumor  10-16-2006  . Cystostomy w/ bladder biopsy  01-09-2012  . Cardiac catheterization  12-17-2013   dr Shanon Brow harding    severe disease RCA with thrombotic appearing subtotal occlusions/  95-99%  OM2/  diffuse disease LAD  and D1/  mild to moderate basal to mid inferior hypokinesis/  severe elevated LVEDP/  ef 45-50%  . Coronary artery bypass graft N/A 12/18/2013    Procedure: Coronary artery bypass grafting times four using left internal mammary artery and endoscopically  harvested right saphenous vein graft;  Surgeon: Gaye Pollack, MD;  Location: [facility] OR;  Service: Open Heart Surgery;  Laterality: N/A;  . Transthoracic echocardiogram  11-14-2012    grade I diastolic dysfunction/  ef 55-60%/  trivial MR  and TR  . Cardiovascular stress test  08-09-2009  dr berry    mild to moderate perfusion defect in basal , mid, and apical inferior regions consistent with an infart/scar/   No evidence ischemia/  ef 51%/  no ECG changes/   low risk scan  . Transurethral resection of bladder tumor N/A 04/07/2014    Procedure: TRANSURETHRAL RESECTION OF BLADDER TUMOR (TURBT);  Surgeon: Arvil Persons, MD;  Location: [facility];  Service: Urology;  Laterality: N/A;  . Cystoscopy/retrograde/ureteroscopy Right 04/07/2014    Procedure: CYSTOSCOPY/RETROGRADE/URETEROSCOPY  WITH BIOPSY OF RENAL PELVIS TUMOR;  Surgeon: Arvil Persons, MD;  Location: [facility]  Service: Urology;  Laterality: Right;  . Robot assisted laparoscopic nephrectomy Right 06/05/2014    Procedure: ROBOTIC ASSISTED LAPAROSCOPIC RIGHT NEPHROURETERECTOMY, VENTRAL HERNIA REPAIR ;  Surgeon: Ardis Hughs, MD;  Location: [facility] ORS;  Service: Urology;  Laterality: Right;  . Cystoscopy w/ ureteral stent placement Right 06/05/2014    Procedure: CYSTOSCOPY ;  Surgeon: Ardis Hughs, MD;  Location: [facility] ORS;  Service: Urology;  Laterality: Right;  . Radiology with anesthesia N/A 06/22/2014    Procedure: MRI BRAIN W/O CONTRAST;  Surgeon: Medication Radiologist, MD;  Location: [place];  Service: Radiology;  Laterality: N/A;  . Eye surgery      cataract surgery  . Hernia repair  [id number]/2841    umbilical hernia repair at time of nephrectomy   Social History:  History   Social History Narrative   Patient is single and lives alone.   Patient is retired.   Patient has a high school education.   Patient is right-handed.   Patient drinks one cup of coffee and one cup of soda or tea daily.   Patient has  three adult children.      Lives in [place] round the clock care    Allergies  Allergen Reactions  . Lorazepam     Hallucinations, worsens agitation  . Statins Other (See Comments)    MUSCLE ACHES    Family History  Problem Relation Age of Onset  . Cancer - Lung Brother   . Heart disease Mother   . Heart attack Mother     Prior to Admission medications   Medication Sig Start Date End Date Taking? Authorizing Provider  aspirin 81 MG chewable tablet Chew 1 tablet (81 mg total) by mouth daily. 08/20/14  Yes Lezlie Octave Black, NP  donepezil (ARICEPT) 10 MG tablet Take 10 mg by mouth at bedtime.   Yes Historical Provider, MD  escitalopram (LEXAPRO) 20 MG tablet Take 20 mg by mouth daily.   Yes Historical Provider, MD  feeding supplement, ENSURE ENLIVE, (ENSURE ENLIVE) LIQD Take 237 mLs by mouth 3 (three) times daily between meals. 08/20/14  Yes Lezlie Octave Black, NP  fludrocortisone (FLORINEF) 0.1 MG tablet Take 0.2 mg by mouth daily.    Yes Historical Provider, MD  levothyroxine (SYNTHROID, LEVOTHROID) 125 MCG tablet Take 125 mcg by mouth daily before breakfast.   Yes Historical Provider, MD  midodrine (PROAMATINE) 10 MG tablet Take 10 mg by mouth 2 (two) times daily.   Yes Historical Provider, MD  pantoprazole (PROTONIX) 40 MG tablet TAKE ONE TABLET EVERY MORNING 04/06/14  Yes Butch Penny, NP  polyethylene glycol (MIRALAX / GLYCOLAX) packet Take 17 g by mouth at bedtime.   Yes Historical Provider, MD  torsemide (DEMADEX) 20 MG tablet Take 1 tablet (20 mg total) by mouth 2 (two) times daily. 09/29/14  Yes Herminio Commons, MD  ziprasidone (GEODON) 20 MG capsule Take 1 capsule (20 mg total) by mouth 2 (two) times daily with a meal. 06/22/14  Yes Cristal Ford, DO   Physical Exam: Filed Vitals:   10/02/14 1640 10/02/14 2041 10/02/14 2045 10/02/14 2100  BP: 109/59 117/67 117/69 127/79  Pulse: 64 68 69 66  Temp: 98.2 F (36.8 C)     TempSrc: Oral     Resp: 16 13 17 17   SpO2: 93%  91% 99% 96%    EOMI NCAT good dentition, arcus and was,  no pallor, no icterus, no JVD, no bruit Thyroid seems normal size No submandibular lymphadenopathy S1-S2 murmur across precordium grade 3/6 best heard left lower sternal edge Chest clinically clear no added sound, seborrheic keratosis on back Abdomen soft nontender nondistended no rebound or guarding No lower extremity edema Grossly moves all 4 limbs equally without deficit, reflexes 2/3 in knees and biceps-rest of neuro exam deferred euthymic and congruent   Labs on Admission:  Basic Metabolic Panel:  Recent Labs Lab 09/29/14 0902 10/02/14 1713  NA 143 138  K 3.4* 2.6*  CL 93* 86*  CO2 35* 37*  GLUCOSE 111* 199*  BUN 28* 31*  CREATININE 2.39* 2.62*  CALCIUM 9.1 9.7   Liver Function Tests:  Recent Labs Lab 10/02/14 1713  AST 20  ALT 17  ALKPHOS 95  BILITOT 0.8  PROT 8.1  ALBUMIN 3.8   No results for input(s): LIPASE, AMYLASE in the last 168 hours. No results for input(s): AMMONIA in the last 168 hours. CBC:  Recent Labs Lab 09/29/14 0902 10/02/14 1713  WBC 8.9 9.2  NEUTROABS  --  7.3  HGB 11.2* 12.5*  HCT 35.4* 39.5  MCV 85.3 84.8  PLT 291 319   Cardiac Enzymes: No results for input(s): CKTOTAL, CKMB, CKMBINDEX, TROPONINI in the last 168 hours.  BNP (last 3 results)  Recent Labs  08/17/14 0900 09/18/14 0130  BNP 2588.0* 2240.0*    ProBNP (last 3 results) No results for input(s): PROBNP in the last 8760 hours.  CBG: No results for input(s): GLUCAP in the last 168 hours.  Radiological Exams on Admission: No results found.  EKG: Independently reviewed. See above discussion  Assessment/Plan Principal Problem:   AKI (acute kidney injury) c Cardiorenal syndrome-difficult situation. Patient walks very tight line as he also has NYHA 3 heart failure. We will hydrate telemetry with IV saline 50 cc X 24 hours. Discontinue Demadex 20 twice a day, He was given samples of metolazone which is  why it's not on his MAR however I will hold that. Renal panel plus magnesium a.m. Monitor overnight at least Active Problems:   Obstructive sleep apnea-patient to trial BiPAP and see if this helps him   Orthostatic hypotension-slightly orthostatic and felt a little dizzy to emergency room physician. Continue Florinef 0.2 mg twice a day, Midrin 10 g twice a day-if orthostatics are positive in a.m. may be able to up titrate Midrin to 10 g 3 times a day.   Moderate to severe hypokalemia-potassium 2.7-likely secondary to thiazide plus loop diabetic-given potassium replacement in ED, the labs a.m., K Dur 40 mg twice a day started on this admission, check magnesium   S/P CABG x 4-continue aspirin 81 mg for secondary prevention   Dementia without behavioral disturbance-patient on Lexapro 20, Geodon 20 twice a day. Daughter tells me that his primary care physician Dr. Hilma Favors attempted to wean back the Geodon to 20 g once daily and then patient started having hallucinations.   Prolonged Q-T interval on ECG-see above discussion as above- We will attempt to cut back his Lexapro from 20 mg to 10 mg daily and see if the QTC improves I have put in a consult for psychiatry to see the patient in terms of help with management of the same. Please follow magnesium.   DM type 2 (diabetes mellitus, type 2)-apparently not on any medications to control his diabetes at present time? Last A1c 6.8 12/18/2013-with his renal clearance I'm not sure if he is a good candidate for oral  medications.   Transitional cell carcinoma of kidney S/p nephrectomy with hematuria and recurrence of bladder tumors-patient will need further follow-up with Dr. Loleta Rose he was scheduled to have cystourethrogram 10/05/58 which has been canceled   CKD (chronic kidney disease) stage 3, GFR 30-59 ml/min-see above discussion   Lower leg DVT (deep venous thrombosis)-patient is status post IVC filter which may be removable in about 6-9 months    Hematuria-stable at present time. Repeat labs in a.m.   Constipation-patient has history of this past 2 days will start him on MiraLAX to like Senokot-S  Given his history of bladder cancer and bleeding, I would place the patient on SCDs at present time He will continue on PPI  presumed full code Long discussion with family at the bedside   Time spent: Drew, [facility] Pager [phone]  If 7PM-7AM, please contact night-coverage [email or web address] Password [email or web address] 10/02/2014, 9:30 PM

## 2014-10-02 NOTE — Progress Notes (Signed)
Called chasity scheduler for dr Louis Meckel and dr Aris Lot anesthesia  and made aware pt went to cardiology appoint today and was sent to [place].

## 2014-10-03 DIAGNOSIS — N183 Chronic kidney disease, stage 3 (moderate): Secondary | ICD-10-CM

## 2014-10-03 DIAGNOSIS — F0390 Unspecified dementia without behavioral disturbance: Secondary | ICD-10-CM

## 2014-10-03 LAB — COMPREHENSIVE METABOLIC PANEL
ALT: 12 U/L — ABNORMAL LOW (ref 17–63)
ANION GAP: 9 (ref 5–15)
AST: 22 U/L (ref 15–41)
Albumin: 3.3 g/dL — ABNORMAL LOW (ref 3.5–5.0)
Alkaline Phosphatase: 83 U/L (ref 38–126)
BILIRUBIN TOTAL: 1 mg/dL (ref 0.3–1.2)
BUN: 28 mg/dL — AB (ref 6–20)
CO2: 35 mmol/L — AB (ref 22–32)
Calcium: 9.2 mg/dL (ref 8.9–10.3)
Chloride: 93 mmol/L — ABNORMAL LOW (ref 101–111)
Creatinine, Ser: 2.06 mg/dL — ABNORMAL HIGH (ref 0.61–1.24)
GFR calc non Af Amer: 29 mL/min — ABNORMAL LOW (ref >60)
GFR, EST AFRICAN AMERICAN: 34 mL/min — AB (ref >60)
GLUCOSE: 111 mg/dL — AB (ref 65–99)
Potassium: 3.8 mmol/L (ref 3.5–5.1)
SODIUM: 137 mmol/L (ref 135–145)
TOTAL PROTEIN: 7.2 g/dL (ref 6.5–8.1)

## 2014-10-03 NOTE — Progress Notes (Signed)
[name] [name] HQI:[id number] DOB: Apr 07, 1934 DOA: 10/02/2014 PCP: Purvis Kilts, MD  Brief narrative: 81 y/o ? Combined CHF NYHA 3 CAD c STEMI c CABG x 4 -grafting from R leg 12/2013 Dr. Cyndia Bent, Chr GERD + Solid food dysphagia + dilatation 01/08/13 foll by Dr. 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 Louis Meckel 06/05/14  Last ECHO=. LVEF by echo on 08/17/14 40-45%, with moderate to severely reduced RV systolic function. Orthostatic hypotension on Meds Dry wght 217lb  Seen at cardiology office and recommended to admit as AKI after being more aggressviely diuresed and prolonged QTc in Cardio office ~500  Psychiatry consulted given multiple psychotropics  Past medical history-As per Problem list Chart reviewed as below-   Consultants:  pscyh  Messaged Dr. Amauri Rumpf to arrange Transitional care visit  Procedures:    Antibiotics:     Subjective  Alert pleasant in NAD tol diet No CP No SOB Eating well No cough, LE swelling, Fever   Objective    Interim History:   Telemetry:    Objective: Filed Vitals:   10/02/14 2247 10/03/14 0444 10/03/14 0444 10/03/14 1320  BP: 122/66 109/69  103/67  Pulse: 77  70 73  Temp: 98.2 F (36.8 C) 98 F (36.7 C)  99.3 F (37.4 C)  TempSrc: Oral Oral  Oral  Resp: 18 18  16   Height:      Weight:      SpO2: 95%  93% 95%    Intake/Output Summary (Last 24 hours) at 10/03/14 1808 Last data filed at 10/03/14 0272  Gross per 24 hour  Intake    120 ml  Output    450 ml  Net   -330 ml    Exam:  General: eomi, congruent pleasant Cardiovascular: s1 s 2no m/r/g Respiratory: clear  Abdomen: soft  [place] Neuro intact  Data Reviewed: Basic Metabolic Panel:  Recent Labs Lab 09/29/14 0902 10/02/14 1713 10/03/14 0723  NA 143 138 137  K 3.4* 2.6* 3.8  CL 93* 86* 93*  CO2 35*  37* 35*  GLUCOSE 111* 199* 111*  BUN 28* 31* 28*  CREATININE 2.39* 2.62* 2.06*  CALCIUM 9.1 9.7 9.2  MG  --  2.1  --    Liver Function Tests:  Recent Labs Lab 10/02/14 1713 10/03/14 0723  AST 20 22  ALT 17 12*  ALKPHOS 95 83  BILITOT 0.8 1.0  PROT 8.1 7.2  ALBUMIN 3.8 3.3*   No results for input(s): LIPASE, AMYLASE in the last 168 hours. No results for input(s): AMMONIA in the last 168 hours. CBC:  Recent Labs Lab 09/29/14 0902 10/02/14 1713  WBC 8.9 9.2  NEUTROABS  --  7.3  HGB 11.2* 12.5*  HCT 35.4* 39.5  MCV 85.3 84.8  PLT 291 319   Cardiac Enzymes: No results for input(s): CKTOTAL, CKMB, CKMBINDEX, TROPONINI in the last 168 hours. BNP: Invalid input(s): POCBNP CBG: No results for input(s): GLUCAP in the last 168 hours.  No results found for this or any previous visit (from the past 240 hour(s)).   Studies:              All Imaging reviewed and is as per above notation   Scheduled Meds: . aspirin  81 mg Oral Daily  . donepezil  10 mg Oral QHS  . escitalopram  10 mg Oral Daily  .  fludrocortisone  0.2 mg Oral Q breakfast  . levothyroxine  125 mcg Oral QAC breakfast  . midodrine  10 mg Oral BID WC  . pantoprazole  40 mg Oral q morning - 10a  . polyethylene glycol  17 g Oral QHS  . potassium chloride  40 mEq Oral BID  . sodium chloride  3 mL Intravenous Q12H   Continuous Infusions:    Assessment/Plan: Principal Problem:   Acute renal failure superimposed on stage 3 chronic kidney disease Active Problems:   Obstructive sleep apnea   Orthostatic hypotension   ST elevation myocardial infarction (STEMI) of inferior wall, initial episode of care   S/P CABG x 4   Dementia without behavioral disturbance   Prolonged Q-T interval on ECG   DM type 2 (diabetes mellitus, type 2)   Transitional cell carcinoma of kidney   S/p nephrectomy   NSTEMI (non-ST elevated myocardial infarction)   CKD (chronic kidney disease) stage 3, GFR 30-59 ml/min   Lower leg  DVT (deep venous thrombosis)   Hematuria   AKI (acute kidney injury) c Cardiorenal syndrome  AKI (acute kidney injury) c Cardiorenal syndrome-difficult situation. Patient walks very tight line as he also has NYHA 3 heart failure. We will hydrate telemetry with IV saline 50 cc X 24 hours-d/c saline 6/25 Bmet am Discontinue Demadex 20 twice a day, He was given samples of metolazone which is why it's not on his MAR however I will hold that. Renal panel plus magnesium a.m.   Obstructive sleep apnea-patient to trial BiPAP and see if this helps him   Orthostatic hypotension-slightly orthostatic and felt a little dizzy to emergency room physician. Continue Florinef 0.2 mg twice a day, Midodrin 10 g twice a day-if orthostatics are positive in a.m. may be able to up titrate Midrin to 10 g 3 times a day.   Moderate to severe hypokalemia-potassium 2.7-likely secondary to thiazide plus loop diabetic-given potassium replacement in ED, the labs a.m., K Dur 40 mg twice a day started on this admission,magnesium 2.1 on admi   S/P CABG x 4-continue aspirin 81 mg for secondary prevention   Dementia without behavioral disturbance-patient on Lexapro 20, Geodon 20 twice a day. Daughter tells me that his primary care physician Dr. Hilma Favors attempted to wean back the Geodon to 20 g once daily and then patient started having hallucinations. Dr. Lamar Benes has indicated to d/c Geodon which has been done   Prolonged Q-T interval on ECG-see above discussion as above- We will attempt to cut back his Lexapro from 20 mg to 10 mg daily-QTc today was 0.46   DM type 2 (diabetes mellitus, type 2)-apparently not on any medications to control his diabetes at present time? Last A1c 6.8 12/18/2013-with his renal clearance I'm not sure if he is a good candidate for oral medications.   Transitional cell carcinoma of kidney S/p nephrectomy with hematuria and recurrence of bladder tumors-patient will need further follow-up  with Dr. Loleta Rose he was scheduled to have cystourethrogram 10/05/58 which has been canceled   CKD (chronic kidney disease) stage 3, GFR 30-59 ml/min-see above discussion   Lower leg DVT (deep venous thrombosis)-patient is status post IVC filter which may be removable in about 6-9 months   Hematuria-stable at present time. Repeat labs in a.m.   Constipation-patient has history of this past 2 days will start him on MiraLAX to like Senokot-S  Given his history of bladder cancer and bleeding, I would place the patient on SCDs at present time  Code  Status: full Family Communication:  Esli, [place] [phone]  [phone]  Updated  Disposition Plan: likely d/c home in am   Verneita Griffes, MD  [facility] Pager [phone] 10/03/2014, 6:08 PM    LOS: 1 day

## 2014-10-03 NOTE — Progress Notes (Signed)
Patient has no IV site on assessment. Notified on-call provider that patient has no IV and likely to dc tomorrow. Provider stated okay to leave IV out for pending dc and no ordered IV medications. Patient's private caregiver at bedside, instructed her to call with any change in condition or other needs.  Will continue to monitor.

## 2014-10-03 NOTE — Consult Note (Signed)
[facility] Face-to-Face Psychiatry Consult   Reason for Consult:  Medication management Referring Physician:  Dr. Verlon Au Patient Identification: [name] [name] MRN:  [id number] Principal Diagnosis: Dementia without behavioral disturbance Diagnosis:   Patient Active Problem List   Diagnosis Date Noted  . AKI (acute kidney injury) c Cardiorenal syndrome [N17.9] 10/02/2014  . Postprocedural Bleeding [R58] 09/18/2014  . DVT (deep venous thrombosis) [I82.409]   . Hematuria [R31.9] 09/02/2014  . Pulmonary embolism suspected [I26.99] 08/20/2014  . Congestive heart disease [I50.9]   . Lower leg DVT (deep venous thrombosis) [I82.4Z9]   . Protein-calorie malnutrition, severe [E43] 08/18/2014  . CHF (congestive heart failure) [I50.9] 08/17/2014  . Acute renal failure superimposed on stage 3 chronic kidney disease [N17.9, N18.3] 08/17/2014  . Acute on chronic systolic and diastolic heart failure, NYHA class 3 [I50.43] 08/17/2014  . CKD (chronic kidney disease) stage 3, GFR 30-59 ml/min [N18.3]   . Palliative care encounter [Z51.5]   . Obstructive uropathy [N13.9]   . Urinary tract infectious disease [N39.0]   . Blindness [H54.0]   . NSTEMI (non-ST elevated myocardial infarction) [I21.4]   . Renal insufficiency [N28.9]   . S/p nephrectomy [Z90.5] 06/12/2014  . Acute renal failure [N17.9] 06/12/2014  . UTI (lower urinary tract infection) [N39.0] 06/12/2014  . Metabolic encephalopathy [K87.68] 06/12/2014  . Elevated troponin [R79.89] 06/12/2014  . Altered mental state [R41.82] 06/12/2014  . Altered mental status [R41.82]   . Transitional cell carcinoma of kidney [C64.9] 06/05/2014  . TIA (transient ischemic attack) [G45.9] 01/23/2014  . Dementia without behavioral disturbance [F03.90] 01/23/2014  . Prolonged Q-T interval on ECG [I45.81] 01/23/2014  . DM type 2 (diabetes mellitus, type 2) [E11.9] 01/23/2014  . Anemia, post op [D64.9] 01/13/2014  . CAD, multiple vessel [I25.10] 12/18/2013  . S/P  CABG x 4 [Z95.1] 12/18/2013  . ST elevation myocardial infarction (STEMI) of inferior wall, initial episode of care [I21.19] 12/17/2013  . Hypoxemia [R09.02] 11/19/2013  . Orthostatic hypotension [I95.1] 11/19/2013  . Dysphagia, unspecified(787.20) [R13.10] 12/12/2012  . Dizziness [R42] 11/07/2012  . Hyperlipidemia [E78.5] 11/07/2012  . Obstructive sleep apnea [G47.33] 11/07/2012    Total Time spent with patient: 45 minutes  Subjective:   [name] is a 81 y.o. male patient admitted with increase in weakness and falls x 3 today.  HPI:  [name] [name] is a 81 years old male admitted to [facility] with increased in weakness and frequent falls for the last 3 days. Patient was found with the significant hypokalemia which required potassium supplementation. Patient reportedly suffering with orthostatic hypotension ST elevation myocardial infarction and status post CABG 4 with prolonged QT interval on ECG. Patient reportedly has no known mental illness but has history of dementia without behavioral disturbance. Patient lives by himself and he has a 24 hours status at home. Patient daughter spend 3 days a week with him. Patient denies current symptoms of depression, anxiety, psychosis and denied suicidal, homicidal ideation, intention or plans. Patient has no evidence of psychotic symptoms. Patient reportedly upset from time to time like anybody else. Patient the times it was at bedside reportedly stated he has no behavioral problems that she can witness. Patient has been taking Geodon 20 mg twice daily with the Food which also has a significant hypokalemia /contraindication. Patient is hoping to participate in surgery on Monday if the findings himself strong with the cardiac condition. Patient has a superficial laceration on his forehead and reportedly minimizes saying it is not making worried.  HPI  Elements:  Location:  Dementia. Quality:  Fair to poor. Severity:  Mild to  moderate. Timing:  Frequent falls. Duration:  3 days. Context:  Multiple medical problems.  Past Medical History:  Past Medical History  Diagnosis Date  . Chronic constipation   . GERD (gastroesophageal reflux disease)   . Arthritis   . Hyperlipidemia   . Orthostatic hypotension 11/19/2013  . Coronary artery disease     a. 12/2013 - STEMI s/p CABGx4.  . Mild dementia   . Type 2 diabetes mellitus   . Hypothyroidism   . History of esophageal dilatation   . History of gastritis   . History of hiatal hernia   . History of bladder cancer     hx  TCC low grade  . Prostate cancer     low-grade  . Kidney tumor     right  . BPH (benign prostatic hyperplasia)   . CKD (chronic kidney disease) stage 3, GFR 30-59 ml/min     a. h/o nephrectomy.  [name] History of kidney stones   . ED (erectile dysfunction)   . S/P CABG x 4     12-18-2013  . Bladder tumor   . Complication of anesthesia     HARD TO WAKE  . OSA on CPAP     mild osa   per sleep study 07-11-2009- patient states does not use regularly  . DVT (deep venous thrombosis)     a. s/p IVC filter 09/2014. Xarelto stopped at that time due to hematuria and hemoccult + stools.  . Myocardial infarction 12/2013  . Stroke     TIA  . Chronic combined systolic and diastolic CHF (congestive heart failure)     systolic and diastolic heart failure  . Headache   . Depression   . Hematuria   . Heme positive stool   . Anemia     Past Surgical History  Procedure Laterality Date  . Upper gastrointestinal endoscopy  04/06/2010  &  01-08-2013    w/ esophageal dilatation  . Hemorrhoid surgery    . Hand surgery      right  . Wrist fracture surgery      pins placed-left  . Cystoscopy with biopsy  01/09/2012    Procedure: CYSTOSCOPY WITH BIOPSY;  Surgeon: Marissa Nestle, MD;  Location: [facility] ORS;  Service: Urology;  Laterality: N/A;  Cystoscopy with Multiple Bladder Biopsies  . Esophagogastroduodenoscopy (egd) with esophageal dilation N/A  01/08/2013    Procedure: ESOPHAGOGASTRODUODENOSCOPY (EGD) WITH ESOPHAGEAL DILATION;  Surgeon: Rogene Houston, MD;  Location: [facility] ENDO SUITE;  Service: Endoscopy;  Laterality: N/A;  120  . Left heart cath N/A 12/17/2013    Procedure: LEFT HEART CATH;  Surgeon: Leonie Man, MD;  Location: [facility] CATH LAB;  Service: Cardiovascular;  Laterality: N/A;  . Left heart catheterization with coronary angiogram N/A 12/17/2013    Procedure: LEFT HEART CATHETERIZATION WITH CORONARY ANGIOGRAM;  Surgeon: Leonie Man, MD;  Location: [facility] CATH LAB;  Service: Cardiovascular;  Laterality: N/A;  . Colonoscopy with esophagogastroduodenoscopy (egd)  08-15-2006    w/ esophageal dilatation  . Umbilical hernia repair  04-24-2003  . Transurethral resection of bladder tumor  06-14-2004  . Transurethral resection of prostate  10-04-2004  . Cysto/  removal bladder calculus/  resection bladder tumor  10-16-2006  . Cystostomy w/ bladder biopsy  01-09-2012  . Cardiac catheterization  12-17-2013   dr Shanon Brow harding    severe disease RCA with thrombotic appearing subtotal occlusions/  95-99%  OM2/  diffuse disease LAD  and D1/  mild to moderate basal to mid inferior hypokinesis/  severe elevated LVEDP/  ef 45-50%  . Coronary artery bypass graft N/A 12/18/2013    Procedure: Coronary artery bypass grafting times four using left internal mammary artery and endoscopically harvested right saphenous vein graft;  Surgeon: Gaye Pollack, MD;  Location: [facility] OR;  Service: Open Heart Surgery;  Laterality: N/A;  . Transthoracic echocardiogram  11-14-2012    grade I diastolic dysfunction/  ef 55-60%/  trivial MR  and TR  . Cardiovascular stress test  08-09-2009  dr berry    mild to moderate perfusion defect in basal , mid, and apical inferior regions consistent with an infart/scar/   No evidence ischemia/  ef 51%/  no ECG changes/   low risk scan  . Transurethral resection of bladder tumor N/A 04/07/2014    Procedure: TRANSURETHRAL RESECTION OF  BLADDER TUMOR (TURBT);  Surgeon: Arvil Persons, MD;  Location: [facility];  Service: Urology;  Laterality: N/A;  . Cystoscopy/retrograde/ureteroscopy Right 04/07/2014    Procedure: CYSTOSCOPY/RETROGRADE/URETEROSCOPY WITH BIOPSY OF RENAL PELVIS TUMOR;  Surgeon: Arvil Persons, MD;  Location: [facility];  Service: Urology;  Laterality: Right;  . Robot assisted laparoscopic nephrectomy Right 06/05/2014    Procedure: ROBOTIC ASSISTED LAPAROSCOPIC RIGHT NEPHROURETERECTOMY, VENTRAL HERNIA REPAIR ;  Surgeon: Ardis Hughs, MD;  Location: [facility] ORS;  Service: Urology;  Laterality: Right;  . Cystoscopy w/ ureteral stent placement Right 06/05/2014    Procedure: CYSTOSCOPY ;  Surgeon: Ardis Hughs, MD;  Location: [facility] ORS;  Service: Urology;  Laterality: Right;  . Radiology with anesthesia N/A 06/22/2014    Procedure: MRI BRAIN W/O CONTRAST;  Surgeon: Medication Radiologist, MD;  Location: [place];  Service: Radiology;  Laterality: N/A;  . Eye surgery      cataract surgery  . Hernia repair  17/0017    umbilical hernia repair at time of nephrectomy   Family History:  Family History  Problem Relation Age of Onset  . Cancer - Lung Brother   . Heart disease Mother   . Heart attack Mother    Social History:  History  Alcohol Use No     History  Drug Use No    History   Social History  . Marital Status: Divorced    Spouse Name: N/A  . Number of Children: 3  . Years of Education: HS   Occupational History  .     Social History Main Topics  . Smoking status: Former Smoker -- 1.00 packs/day for 4 years    Types: Cigarettes    Start date: 09/08/1952    Quit date: 09/09/1959  . Smokeless tobacco: Former Systems developer    Types: [place]ott date: 01/03/1960  . Alcohol Use: No  . Drug Use: No  . Sexual Activity: Yes    Birth Control/ Protection: None   Other Topics Concern  . None   Social History Narrative   Patient is single and lives alone.   Patient is retired.    Patient has a high school education.   Patient is right-handed.   Patient drinks one cup of coffee and one cup of soda or tea daily.   Patient has three adult children.      Lives in [place] round the clock care   Additional Social History:  Allergies:   Allergies  Allergen Reactions  . Lorazepam     Hallucinations, worsens agitation  . Statins Other (See Comments)    MUSCLE ACHES    Labs:  Results for orders placed or performed during the hospital encounter of 10/02/14 (from the past 48 hour(s))  CBC with Differential     Status: Abnormal   Collection Time: 10/02/14  5:13 PM  Result Value Ref Range   WBC 9.2 4.0 - 10.5 K/uL   RBC 4.66 4.22 - 5.81 MIL/uL   Hemoglobin 12.5 (L) 13.0 - 17.0 g/dL   HCT 39.5 39.0 - 52.0 %   MCV 84.8 78.0 - 100.0 fL   MCH 26.8 26.0 - 34.0 pg   MCHC 31.6 30.0 - 36.0 g/dL   RDW 16.1 (H) 11.5 - 15.5 %   Platelets 319 150 - 400 K/uL   Neutrophils Relative % 79 (H) 43 - 77 %   Neutro Abs 7.3 1.7 - 7.7 K/uL   Lymphocytes Relative 14 12 - 46 %   Lymphs Abs 1.3 0.7 - 4.0 K/uL   Monocytes Relative 6 3 - 12 %   Monocytes Absolute 0.5 0.1 - 1.0 K/uL   Eosinophils Relative 1 0 - 5 %   Eosinophils Absolute 0.1 0.0 - 0.7 K/uL   Basophils Relative 0 0 - 1 %   Basophils Absolute 0.0 0.0 - 0.1 K/uL  Comprehensive metabolic panel     Status: Abnormal   Collection Time: 10/02/14  5:13 PM  Result Value Ref Range   Sodium 138 135 - 145 mmol/L   Potassium 2.6 (LL) 3.5 - 5.1 mmol/L    Comment: REPEATED TO VERIFY CRITICAL RESULT CALLED TO, READ BACK BY AND VERIFIED WITH: BREWER,E RN _0  BY GRINSTEAD,C 6.24.16    Chloride 86 (L) 101 - 111 mmol/L   CO2 37 (H) 22 - 32 mmol/L   Glucose, Bld 199 (H) 65 - 99 mg/dL   BUN 31 (H) 6 - 20 mg/dL   Creatinine, Ser 2.62 (H) 0.61 - 1.24 mg/dL   Calcium 9.7 8.9 - 10.3 mg/dL   Total Protein 8.1 6.5 - 8.1 g/dL   Albumin 3.8 3.5 - 5.0 g/dL   AST 20 15 - 41 U/L   ALT 17 17 -  63 U/L   Alkaline Phosphatase 95 38 - 126 U/L   Total Bilirubin 0.8 0.3 - 1.2 mg/dL   GFR calc non Af Amer 22 (L) >60 mL/min   GFR calc Af Amer 25 (L) >60 mL/min    Comment: (NOTE) The eGFR has been calculated using the CKD EPI equation. This calculation has not been validated in all clinical situations. eGFR's persistently <60 mL/min signify possible Chronic Kidney Disease.    Anion gap 15 5 - 15  Protime-INR     Status: None   Collection Time: 10/02/14  5:13 PM  Result Value Ref Range   Prothrombin Time 15.0 11.6 - 15.2 seconds   INR 1.16 0.00 - 1.49  Magnesium     Status: None   Collection Time: 10/02/14  5:13 PM  Result Value Ref Range   Magnesium 2.1 1.7 - 2.4 mg/dL  I-stat troponin, ED  (not at [facility])     Status: None   Collection Time: 10/02/14  5:39 PM  Result Value Ref Range   Troponin i, poc 0.05 0.00 - 0.08 ng/mL   Comment 3            Comment: Due to the release kinetics of  cTnI, a negative result within the first hours of the onset of symptoms does not rule out myocardial infarction with certainty. If myocardial infarction is still suspected, repeat the test at appropriate intervals.   Urinalysis, Routine w reflex microscopic (not at [facility])     Status: Abnormal   Collection Time: 10/02/14 11:12 PM  Result Value Ref Range   Color, Urine YELLOW YELLOW   APPearance CLOUDY (A) CLEAR   Specific Gravity, Urine 1.015 1.005 - 1.030   pH 7.0 5.0 - 8.0   Glucose, UA NEGATIVE NEGATIVE mg/dL   Hgb urine dipstick NEGATIVE NEGATIVE   Bilirubin Urine NEGATIVE NEGATIVE   Ketones, ur NEGATIVE NEGATIVE mg/dL   Protein, ur NEGATIVE NEGATIVE mg/dL   Urobilinogen, UA 1.0 0.0 - 1.0 mg/dL   Nitrite NEGATIVE NEGATIVE   Leukocytes, UA NEGATIVE NEGATIVE    Comment: MICROSCOPIC NOT DONE ON URINES WITH NEGATIVE PROTEIN, BLOOD, LEUKOCYTES, NITRITE, OR GLUCOSE <1000 mg/dL.  Comprehensive metabolic panel     Status: Abnormal   Collection Time: 10/03/14  7:23 AM  Result Value Ref  Range   Sodium 137 135 - 145 mmol/L   Potassium 3.8 3.5 - 5.1 mmol/L   Chloride 93 (L) 101 - 111 mmol/L   CO2 35 (H) 22 - 32 mmol/L   Glucose, Bld 111 (H) 65 - 99 mg/dL   BUN 28 (H) 6 - 20 mg/dL   Creatinine, Ser 2.06 (H) 0.61 - 1.24 mg/dL   Calcium 9.2 8.9 - 10.3 mg/dL   Total Protein 7.2 6.5 - 8.1 g/dL   Albumin 3.3 (L) 3.5 - 5.0 g/dL   AST 22 15 - 41 U/L   ALT 12 (L) 17 - 63 U/L   Alkaline Phosphatase 83 38 - 126 U/L   Total Bilirubin 1.0 0.3 - 1.2 mg/dL   GFR calc non Af Amer 29 (L) >60 mL/min   GFR calc Af Amer 34 (L) >60 mL/min    Comment: (NOTE) The eGFR has been calculated using the CKD EPI equation. This calculation has not been validated in all clinical situations. eGFR's persistently <60 mL/min signify possible Chronic Kidney Disease.    Anion gap 9 5 - 15    Vitals: Blood pressure 103/67, pulse 73, temperature 99.3 F (37.4 C), temperature source Oral, resp. rate 16, height 5' 10.5" (1.791 m), weight 93.804 kg (206 lb 12.8 oz), SpO2 95 %.  Risk to Self: Is patient at risk for suicide?: No Risk to Others:   Prior Inpatient Therapy:   Prior Outpatient Therapy:    Current Facility-Administered Medications  Medication Dose Route Frequency Provider Last Rate Last Dose  . 0.9 %  sodium chloride infusion   Intravenous Continuous Nita Sells, MD 50 mL/hr at 10/02/14 2149 50 mL/hr at 10/02/14 2149  . 0.9 %  sodium chloride infusion   Intravenous Continuous Nita Sells, MD 50 mL/hr at 10/02/14 2300    . aspirin chewable tablet 81 mg  81 mg Oral Daily Nita Sells, MD   81 mg at 10/03/14 1028  . donepezil (ARICEPT) tablet 10 mg  10 mg Oral QHS Nita Sells, MD   10 mg at 10/02/14 2354  . escitalopram (LEXAPRO) tablet 10 mg  10 mg Oral Daily Nita Sells, MD   10 mg at 10/03/14 1027  . fludrocortisone (FLORINEF) tablet 0.2 mg  0.2 mg Oral Q breakfast Nita Sells, MD   0.2 mg at 10/03/14 0836  . levothyroxine (SYNTHROID,  LEVOTHROID) tablet 125 mcg  125 mcg Oral QAC breakfast  Nita Sells, MD   125 mcg at 10/03/14 [phone]  . midodrine (PROAMATINE) tablet 10 mg  10 mg Oral BID WC Nita Sells, MD   10 mg at 10/03/14 0836  . pantoprazole (PROTONIX) EC tablet 40 mg  40 mg Oral q morning - 10a Nita Sells, MD   40 mg at 10/03/14 1027  . polyethylene glycol (MIRALAX / GLYCOLAX) packet 17 g  17 g Oral QHS Nita Sells, MD   17 g at 10/02/14 2354  . potassium chloride SA (K-DUR,KLOR-CON) CR tablet 40 mEq  40 mEq Oral BID Nita Sells, MD   40 mEq at 10/03/14 1028  . senna-docusate (Senokot-S) tablet 1 tablet  1 tablet Oral QHS PRN Nita Sells, MD      . sodium chloride 0.9 % injection 3 mL  3 mL Intravenous Q12H Nita Sells, MD   3 mL at 10/02/14 2300  . ziprasidone (GEODON) capsule 20 mg  20 mg Oral BID WC Nita Sells, MD   20 mg at 10/03/14 8185    Musculoskeletal: Strength & Muscle Tone: decreased Gait & Station: unable to stand Patient leans: N/A  Psychiatric Specialty Exam: Physical Exam as per history and physical   ROS dizziness, orthostatic hypotension, generalized weakness No Fever-chills, No Headache, No changes with Vision or hearing, reports vertigo No problems swallowing food or Liquids, No Chest pain, Cough or Shortness of Breath, No Abdominal pain, No Nausea or Vommitting, Bowel movements are regular, No Blood in stool or Urine, No dysuria, No new skin rashes or bruises, No new joints pains-aches,  No new weakness, tingling, numbness in any extremity, No recent weight gain or loss, No polyuria, polydypsia or polyphagia,   A full 10 point Review of Systems was done, except as stated above, all other Review of Systems were negative.  Blood pressure 103/67, pulse 73, temperature 99.3 F (37.4 C), temperature source Oral, resp. rate 16, height 5' 10.5" (1.791 m), weight 93.804 kg (206 lb 12.8 oz), SpO2 95 %.Body mass index is 29.24  kg/(m^2).  General Appearance: Casual  Eye Contact::  Good  Speech:  Clear and Coherent  Volume:  Normal  Mood:  Euthymic  Affect:  Appropriate and Congruent  Thought Process:  Coherent and Goal Directed  Orientation:  Full (Time, Place, and Person)  Thought Content:  WDL  Suicidal Thoughts:  No  Homicidal Thoughts:  No  Memory:  Immediate;   Fair Recent;   Fair  Judgement:  Intact  Insight:  Good  Psychomotor Activity:  Decreased  Concentration:  Fair  Recall:  [employer] of Knowledge:Good  Language: Good  Akathisia:  Negative  Handed:  Right  AIMS (if indicated):     Assets:  Communication Skills Desire for Improvement Financial Resources/Insurance Housing Intimacy Leisure Time Resilience Social Support Talents/Skills Transportation  ADL's:  Impaired  Cognition: WNL  Sleep:      Medical Decision Making: Review of Psycho-Social Stressors (1), Review or order clinical lab tests (1), Established Problem, Worsening (2), Review of Last Therapy Session (1), Review or order medicine tests (1), Review of Medication Regimen & Side Effects (2) and Review of New Medication or Change in Dosage (2)  Treatment Plan Summary: Patient presented with history of dementia without behavioral problems, generalized weakness, frequent falls, orthostatic hypotension and multiple medical problems including ST elevation myocardial infarction and other medical problems. Patient had hypokalemia at the time of admission which was required supplementation. Patient is currently on cardiac monitoring. Review of his medication indicated Geodon  20 mg twice a day does not required at this time and may cause hypokalemia or contraindication and hypokalemia Daily contact with patient to assess and evaluate symptoms and progress in treatment and Medication management  Plan: Discontinue Geodon which is a contraindication during hypokalemia and at the same time not seen indication during this  evaluation. Patient does not meet criteria for psychiatric inpatient admission. Supportive therapy provided about ongoing stressors. Appreciate psychiatric consultation and sign off at this time Please contact [phone] or [phone] if needs further assistance   Disposition: Patient will be referred to the outpatient medication management and medically stable.   Kalila Adkison,JANARDHAHA R. 10/03/2014 2:41 PM

## 2014-10-04 LAB — BASIC METABOLIC PANEL
Anion gap: 10 (ref 5–15)
BUN: 27 mg/dL — AB (ref 6–20)
CO2: 30 mmol/L (ref 22–32)
Calcium: 9 mg/dL (ref 8.9–10.3)
Chloride: 96 mmol/L — ABNORMAL LOW (ref 101–111)
Creatinine, Ser: 1.88 mg/dL — ABNORMAL HIGH (ref 0.61–1.24)
GFR calc non Af Amer: 33 mL/min — ABNORMAL LOW (ref >60)
GFR, EST AFRICAN AMERICAN: 38 mL/min — AB (ref >60)
GLUCOSE: 104 mg/dL — AB (ref 65–99)
POTASSIUM: 3.4 mmol/L — AB (ref 3.5–5.1)
Sodium: 136 mmol/L (ref 135–145)

## 2014-10-04 LAB — GLUCOSE, CAPILLARY: Glucose-Capillary: 118 mg/dL — ABNORMAL HIGH (ref 65–99)

## 2014-10-04 LAB — MAGNESIUM: MAGNESIUM: 2.1 mg/dL (ref 1.7–2.4)

## 2014-10-04 MED ORDER — GI COCKTAIL ~~LOC~~
30.0000 mL | Freq: Once | ORAL | Status: AC
  Administered 2014-10-04: 30 mL via ORAL
  Filled 2014-10-04: qty 30

## 2014-10-04 MED ORDER — POTASSIUM CHLORIDE CRYS ER 20 MEQ PO TBCR
40.0000 meq | EXTENDED_RELEASE_TABLET | Freq: Every day | ORAL | Status: DC
  Administered 2014-10-04: 40 meq via ORAL

## 2014-10-04 MED ORDER — POTASSIUM CHLORIDE CRYS ER 20 MEQ PO TBCR: 40.0000 meq | EXTENDED_RELEASE_TABLET | Freq: Two times a day (BID) | ORAL | Status: DC

## 2014-10-04 NOTE — Progress Notes (Signed)
Utilization Review completed. Samik Balkcom RN BSN CM 

## 2014-10-04 NOTE — Progress Notes (Signed)
Pt discharging to home with family. Vitals stable. No IV access. Discharge education and instructions given to family and pt. All questions addressed.

## 2014-10-04 NOTE — Discharge Summary (Signed)
Physician Discharge Summary  [name] [name] XNA:[id number] DOB: Jan 24, 1934 DOA: 10/02/2014  PCP: Purvis Kilts, MD  Admit date: 10/02/2014 Discharge date: 10/04/2014  Time spent: 45 minutes  Recommendations for Outpatient Follow-up:  1.  to meds as below Order on Discharge  - potassium chloride SA (K-DUR,KLOR-CON) 20 MEQ tablet  Resume on Discharge  - aspirin 81 MG chewable tablet - donepezil (ARICEPT) 10 MG tablet - escitalopram (LEXAPRO) 20 MG tablet - fludrocortisone (FLORINEF) 0.1 MG tablet - levothyroxine (SYNTHROID, LEVOTHROID) 125 MCG tablet - midodrine (PROAMATINE) 10 MG tablet - pantoprazole (PROTONIX) 40 MG tablet - polyethylene glycol (MIRALAX / GLYCOLAX) packet  Don't Resume on Discharge  - feeding supplement, ENSURE ENLIVE, (ENSURE ENLIVE) LIQD - torsemide (DEMADEX) 20 MG tablet - ziprasidone (GEODON) 20 MG capsule 2.  will need psychiatry input on discharge regarding delusions and delirium 3. Will need basic metabolic panel as well as magnesium done in 1-2 days to rule out hypomagnesemia hypokalemia 4. Recommend geriatric psychiatrist to see the patient has an outpatient and help evaluate his anxiety and delusions 5. He will not be discharged on any diaphoretic and will probably need very careful management of his fluid status as he has only one kidney 6. Patient will benefit from discussions of goals of care as an outpatient with his primary care physician and cardiologist  Discharge Diagnoses:  Principal Problem:   Acute renal failure superimposed on stage 3 chronic kidney disease Active Problems:   Obstructive sleep apnea   Orthostatic hypotension   ST elevation myocardial infarction (STEMI) of inferior wall, initial episode of care   S/P CABG x 4   Dementia without behavioral disturbance   Prolonged Q-T interval on ECG   DM type 2 (diabetes mellitus, type 2)   Transitional cell carcinoma of kidney   S/p nephrectomy   NSTEMI (non-ST elevated myocardial  infarction)   CKD (chronic kidney disease) stage 3, GFR 30-59 ml/min   Lower leg DVT (deep venous thrombosis)   Hematuria   AKI (acute kidney injury) c Cardiorenal syndrome   Discharge Condition: Guarded  Diet recommendation: Heart healthy low-salt  Filed Weights   10/02/14 2137  Weight: 93.804 kg (206 lb 12.8 oz)  History of present illness:   81 y/o ? Combined CHF NYHA 3 CAD c STEMI  c CABG x 4 -grafting from R leg 12/2013 Dr. Cyndia Bent, Chr GERD + Solid food dysphagia + dilatation 01/08/13 foll by Dr. Laural Golden, known bladder Ca + low grade ?    tract tumour R Renal pelvis PR Ca [s/p [id number] for BPH] recently s/p Robotic-assisted Lap Nephroureterectomy + Ventral hernia repair by Dr. Louis Meckel 06/05/14   Last ECHO=.  LVEF by echo on 08/17/14 40-45%, with moderate to severely reduced RV systolic function. Orthostatic hypotension on Meds Dry wght 217lb  Seen at cardiology office and recommended to admit as AKI after being more aggressviely diuresed and prolonged QTc in Cardio office ~500  Psychiatry consulted given multiple psychotropics  Hospital course AKI (acute kidney injury) c Cardiorenal syndrome in a patient with a solitary kidney -difficult situation. Patient walks very tight line as he also has NYHA 3 heart failure.  -Transiently hydrated with saline 50 cc per hour for 24 hours until 6/25 -This admission we did discontinue Discontinue Demadex 20 twice a day, He was given samples of metolazone which is why it's not on his MAR however I will hold that.    Obstructive sleep apnea-patient to trial BiPAP and see if this helps him  Orthostatic hypotension- -slightly orthostatic and felt a little dizzy to emergency room physician.  -One of the side effects of Geodon is orthostasis -Continue Florinef 0.2 mg twice a day, Midodrin 10 g twice a day -He was not orthostatic during the hospital stay and we may consider up titration of his Midrin    Moderate to severe hypokalemia- -potassium  2.7-likely secondary to thiazide plus loop diabetic -given potassium replacement in ED - K Dur 40 mg twice a day started on this admission-switched to 40 mg daily on discharge -magnesium 2.1 on admission    S/P CABG x 4 -continue aspirin 81 mg for secondary prevention    Dementia with behavioral disturbance -patient on Lexapro 20, Geodon 20 twice a day. - Daughter tells me that his primary care physician Dr. Hilma Favors attempted to wean back the Geodon to 20 g once daily and then patient started having hallucinations. -Dr. Lamar Benes has indicated to d/c Geodon which has been done--there is circumstantial evidence that there is a Psychologist, occupational warning on Geodon and other medications as they cause sudden death and heart attack with patients who have predisposed hypokalemia. -Patient will continue his Lexapro at 10 mg on discharge -He will need a geriatric psychiatrist to see him as an outpatient    Prolonged Q-T interval on ECG-see above discussion as above - We will attempt to cut back his Lexapro from 20 mg to 10 mg daily -QTc today was 0.47 on discharge    DM type 2 (diabetes mellitus, type 2)- -apparently not on any medications to control his diabetes at present time? - Last A1c 6.8 12/18/2013 -with his renal clearance I'm not sure if he is a good candidate for oral medications.    Transitional cell carcinoma of kidney S/p nephrectomy with hematuria and recurrence of bladder tumors -patient will need further follow-up with Dr. Loleta Rose he was scheduled to have cystourethrogram 10/05/58 which has been canceled    CKD (chronic kidney disease) stage 3, GFR 30-59 ml/min-see above discussion    Lower leg DVT (deep venous thrombosis)- patient is status post IVC filter which may be removable in about 6-9 months    Hematuria-stable at present time. Repeat labs in a.m.    Constipation-patient has history of this past 2 days will start him on MiraLAX to like Senokot-S  Given his history of bladder  cancer and bleeding, I would place the patient on SCDs at present time    Discharge Exam: Filed Vitals:   10/04/14 0705  BP: 117/64  Pulse: 67  Temp:   Resp: 17    General: Alert slightly agitated but not in distress Cardiovascular: S1-S2 no murmur rub or gallop Respiratory: Clinically clear No lower extremity edema  Discharge Instructions   Discharge Instructions    Diet - low sodium heart healthy    Complete by:  As directed      Discharge instructions    Complete by:  As directed   STOP GEODON Follow up with Dr. Morrell Riddle is aware of your admission tot eh hospital and will be seeing you in 2-3 days and will call you for an appt Continue potassium  Medication changes as below Order on Discharge- potassium chloride SA (K-DUR,KLOR-CON) 20 MEQ tablet  Resume on Discharge- aspirin 81 MG chewable tablet- donepezil (ARICEPT) 10 MG tablet- escitalopram (LEXAPRO) 20 MG tablet- fludrocortisone (FLORINEF) 0.1 MG tablet- levothyroxine (SYNTHROID, LEVOTHROID) 125 MCG tablet- midodrine (PROAMATINE) 10 MG tablet- pantoprazole (PROTONIX) 40 MG tablet- polyethylene glycol (MIRALAX / GLYCOLAX) packet  Don't  Resume on Discharge- feeding supplement, ENSURE ENLIVE, (ENSURE ENLIVE) LIQD- torsemide (DEMADEX) 20 MG tablet- ziprasidone (GEODON) 20 MG capsule     Increase activity slowly    Complete by:  As directed           Current Discharge Medication List    START taking these medications   Details  potassium chloride SA (K-DUR,KLOR-CON) 20 MEQ tablet Take 2 tablets (40 mEq total) by mouth 2 (two) times daily. Qty: 60 tablet, Refills: 0      CONTINUE these medications which have NOT CHANGED   Details  aspirin 81 MG chewable tablet Chew 1 tablet (81 mg total) by mouth daily.    donepezil (ARICEPT) 10 MG tablet Take 10 mg by mouth at bedtime.    escitalopram (LEXAPRO) 20 MG tablet Take 20 mg by mouth daily.    fludrocortisone (FLORINEF) 0.1 MG tablet Take 0.2 mg by mouth  daily.     levothyroxine (SYNTHROID, LEVOTHROID) 125 MCG tablet Take 125 mcg by mouth daily before breakfast.    midodrine (PROAMATINE) 10 MG tablet Take 10 mg by mouth 2 (two) times daily.    pantoprazole (PROTONIX) 40 MG tablet TAKE ONE TABLET EVERY MORNING Qty: 30 tablet, Refills: 4    polyethylene glycol (MIRALAX / GLYCOLAX) packet Take 17 g by mouth at bedtime.      STOP taking these medications     feeding supplement, ENSURE ENLIVE, (ENSURE ENLIVE) LIQD      torsemide (DEMADEX) 20 MG tablet      ziprasidone (GEODON) 20 MG capsule        Allergies  Allergen Reactions  . Lorazepam     Hallucinations, worsens agitation  . Statins Other (See Comments)    MUSCLE ACHES      The results of significant diagnostics from this hospitalization (including imaging, microbiology, ancillary and laboratory) are listed below for reference.    Significant Diagnostic Studies: Ct Abdomen Pelvis Wo Contrast  09/18/2014   CLINICAL DATA:  Rectal bleeding. Hematuria. Recent IVC filter placement.  EXAM: CT ABDOMEN AND PELVIS WITHOUT CONTRAST  TECHNIQUE: Multidetector CT imaging of the abdomen and pelvis was performed following the standard protocol without IV contrast.  COMPARISON:  03/13/2014  FINDINGS: There are unremarkable unenhanced appearances of the liver, gallbladder, bile ducts, pancreas, spleen and adrenals.  There is right nephrectomy. The left kidney is remarkable only for multiple collecting system calculi measuring up to 5 mm. Left ureter is unremarkable. Urinary bladder is unremarkable. The polypoid urinary bladder mass observed on 03/13/2014 is not evident on this scan although the lack of intravenous contrast would decrease sensitivity.  Bowel is unremarkable except for a small hiatal hernia and uncomplicated colonic diverticulosis.  The abdominal aorta is normal in caliber. There is mild atherosclerotic calcification. There is no adenopathy in the abdomen or pelvis.  The IVC filter  is well positioned with tip at the level of the left renal vein.  No significant skeletal lesions are evident. There is moderate lower lumbar facet arthritis.  There are small pleural effusions bilaterally, right greater than left.  IMPRESSION: 1. Left nephrolithiasis 2. Small hiatal hernia 3. Diverticulosis 4. Well-positioned IVC filter 5. Bilateral pleural effusions, small.   Electronically Signed   By: Andreas Newport M.D.   On: 09/18/2014 06:58   Dg Chest 2 View  09/18/2014   CLINICAL DATA:  Bleeding from rectum and venous access site.  EXAM: CHEST  2 VIEW  COMPARISON:  08/17/2014  FINDINGS: There is mild unchanged cardiomegaly.  The lungs are clear. There are no pleural effusions. There is mild chronic right lateral base pleural thickening. Pulmonary vasculature is normal.  IMPRESSION: Mild cardiomegaly.  No acute cardiopulmonary findings.   Electronically Signed   By: Andreas Newport M.D.   On: 09/18/2014 02:15   Dg Ankle Complete Left  09/14/2014   CLINICAL DATA:  Three days of left foot and ankle pain, inability to bear weight, no known injury.  EXAM: LEFT ANKLE COMPLETE - 3+ VIEW  COMPARISON:  Left foot series of today's date  FINDINGS: The ankle joint mortise is preserved. The talar dome is intact. There small amount of soft tissue swelling anteriorly and medially. The talus and calcaneus exhibit no acute abnormalities. There are plantar and Achilles region calcaneal spurs.  IMPRESSION: There is no acute or significant chronic bony abnormality of the left ankle. There is mild soft tissue swelling.   Electronically Signed   By: David  Martinique M.D.   On: 09/14/2014 12:53   Ct Angio Chest Pe W/cm &/or Wo Cm  09/11/2014   CLINICAL DATA:  Right lower extremity deep venous thrombosis. History of urinary bladder neoplasm  EXAM: CT ANGIOGRAPHY CHEST WITH CONTRAST  TECHNIQUE: Multidetector CT imaging of the chest was performed using the standard protocol during bolus administration of intravenous contrast.  Multiplanar CT image reconstructions and MIPs were obtained to evaluate the vascular anatomy.  CONTRAST:  4mL OMNIPAQUE IOHEXOL 350 MG/ML SOLN  COMPARISON:  Chest radiograph Aug 17, 2014  FINDINGS: There is no demonstrable pulmonary embolus. There is atherosclerotic change in aorta. There is no thoracic aortic aneurysm.  There are scattered small bullae in both upper lobes.  There are bilateral pleural effusions. There is patchy infiltrate in the inferior lingula as well as in the anterior segment of the left upper lobe. There is mild bibasilar lung atelectasis.  On axial slice 36 series 7, there is a nodular opacity in the inferior aspect of the anterior segment of the right upper lobe measuring 7 mm.  Visualized thyroid is unremarkable. There are multiple small mediastinal lymph nodes but no adenopathy. The patient is status post coronary artery bypass grafting. Pericardium is not thickened. Heart is mildly enlarged. There is contrast refluxing into the inferior vena cava and hepatic vein suggesting a degree of right heart strain/failure.  No focal lesions are identified in the visualized upper abdomen.  There is degenerative change in the thoracic spine. There are no blastic or lytic bone lesions.  Review of the MIP images confirms the above findings.  IMPRESSION: No demonstrable pulmonary embolus.  Cardiomegaly with bilateral pleural effusions. There may be a degree of congestive heart failure. There is no frank edema, however. Reflux of contrast into the hepatic veins and inferior vena cava suggests a degree of right heart strain/failure.  Areas of alveolar opacity on the left appear more consistent with pneumonia than edema.  7 mm nodular opacity right upper lobe. Followup of this nodular opacity should be based on Fleischner Society guidelines. If the patient is at high risk for bronchogenic carcinoma, follow-up chest CT at 3-91months is recommended. If the patient is at low risk for bronchogenic carcinoma,  follow-up chest CT at 6-12 months is recommended. This recommendation follows the consensus statement: Guidelines for Management of Small Pulmonary Nodules Detected on CT Scans: A Statement from the [employer] as published in Radiology 2005; 237:395-400.   Electronically Signed   By: Lowella Grip III M.D.   On: 09/11/2014 14:15   Ir Ivc Filter  Plmt / S&i /img Guid/mod Sed  09/17/2014   CLINICAL DATA:  DVT, hematuria. Needs to go up anticoagulation for urologic procedure. Caval filtration requested. Renal insufficiency.  EXAM: INFERIOR VENACAVOGRAM  IVC FILTER PLACEMENT UNDER FLUOROSCOPY  FLUOROSCOPY TIME:  24 seconds, 149 mGy  TECHNIQUE: The procedure, risks (including but not limited to bleeding, infection, organ damage ), benefits, and alternatives were explained to the patient. Questions regarding the procedure were encouraged and answered. The patient understands and consents to the procedure. Patency of the right IJ vein was confirmed with ultrasound with image documentation. An appropriate skin site was determined. Skin site was marked, prepped with chlorhexidine, and draped using maximum barrier technique. The region was infiltrated locally with 1% lidocaine.  Intravenous Fentanyl and Versed were administered as conscious sedation during continuous cardiorespiratory monitoring by the radiology RN, with a total moderate sedation time of 6 minutes.  Under real-time ultrasound guidance, the right IJ vein was accessed with a 21 gauge micropuncture needle; the needle tip within the vein was confirmed with ultrasound image documentation. The needle was exchanged over a 018 guidewire for a transitional dilator, which allow advancement of the [facility] wire into the IVC. A long 6 French vascular sheath was placed for inferior CO2 venacavography. This demonstrated no caval thrombus. Renal vein inflows were evident.  The [facility] IVC filter was advanced through the sheath and successfully deployed under  fluoroscopy at the L2 infrarenal level. Followup CO2 cavagram demonstrates stable filter position and no evident complication. The sheath was removed and hemostasis achieved at the site. No immediate complication.  IMPRESSION: 1. Normal IVC. No thrombus or significant anatomic variation. 2. Technically successful infrarenal IVC filter placement. This is a retrievable model.   Electronically Signed   By: Lucrezia Europe M.D.   On: 09/17/2014 15:56   Dg Foot Complete Left  09/14/2014   CLINICAL DATA:  Pain in the arch of the foot extending into the ankle for the past 3 days without known injury, inability to bear weight  EXAM: LEFT FOOT - COMPLETE 3+ VIEW  COMPARISON:  Left ankle of today's date  FINDINGS: The bones are mildly osteopenic. The interphalangeal and metatarsophalangeal joint its are within the limits of normal for age. There is no acute or healing fracture of the metatarsals or phalanges. The midfoot and hindfoot exhibit no acute abnormalities. There are plantar and Achilles region calcaneal spurs. The soft tissues of the foot are unremarkable.  IMPRESSION: There is no acute bony abnormality of the left foot.  If the patient's symptoms persist and remain unexplained, MRI may be useful.   Electronically Signed   By: David  Martinique M.D.   On: 09/14/2014 12:54    Microbiology: No results found for this or any previous visit (from the past 240 hour(s)).   Labs: Basic Metabolic Panel:  Recent Labs Lab 09/29/14 0902 10/02/14 1713 10/03/14 0723 10/04/14 0338  NA 143 138 137 136  K 3.4* 2.6* 3.8 3.4*  CL 93* 86* 93* 96*  CO2 35* 37* 35* 30  GLUCOSE 111* 199* 111* 104*  BUN 28* 31* 28* 27*  CREATININE 2.39* 2.62* 2.06* 1.88*  CALCIUM 9.1 9.7 9.2 9.0  MG  --  2.1  --  2.1   Liver Function Tests:  Recent Labs Lab 10/02/14 1713 10/03/14 0723  AST 20 22  ALT 17 12*  ALKPHOS 95 83  BILITOT 0.8 1.0  PROT 8.1 7.2  ALBUMIN 3.8 3.3*   No results for input(s): LIPASE, AMYLASE in the  last  168 hours. No results for input(s): AMMONIA in the last 168 hours. CBC:  Recent Labs Lab 09/29/14 0902 10/02/14 1713  WBC 8.9 9.2  NEUTROABS  --  7.3  HGB 11.2* 12.5*  HCT 35.4* 39.5  MCV 85.3 84.8  PLT 291 319   Cardiac Enzymes: No results for input(s): CKTOTAL, CKMB, CKMBINDEX, TROPONINI in the last 168 hours. BNP: BNP (last 3 results)  Recent Labs  08/17/14 0900 09/18/14 0130  BNP 2588.0* 2240.0*    ProBNP (last 3 results) No results for input(s): PROBNP in the last 8760 hours.  CBG:  Recent Labs Lab 10/04/14 0640  GLUCAP 118*       Signed:  Nita Sells  [facility] 10/04/2014, 11:10 AM

## 2014-10-05 ENCOUNTER — Ambulatory Visit (HOSPITAL_COMMUNITY): Admission: RE | Admit: 2014-10-05 | Payer: Medicare Other | Source: Ambulatory Visit | Admitting: Urology

## 2014-10-05 ENCOUNTER — Encounter (HOSPITAL_COMMUNITY): Admission: RE | Payer: Self-pay | Source: Ambulatory Visit

## 2014-10-05 SURGERY — TRANSURETHRAL RESECTION OF BLADDER TUMOR WITH GYRUS (TURBT-GYRUS)
Anesthesia: Choice

## 2014-10-13 DIAGNOSIS — N289 Disorder of kidney and ureter, unspecified: Secondary | ICD-10-CM | POA: Diagnosis not present

## 2014-10-13 DIAGNOSIS — Z6831 Body mass index (BMI) 31.0-31.9, adult: Secondary | ICD-10-CM | POA: Diagnosis not present

## 2014-10-13 DIAGNOSIS — Z0001 Encounter for general adult medical examination with abnormal findings: Secondary | ICD-10-CM | POA: Diagnosis not present

## 2014-10-13 DIAGNOSIS — E876 Hypokalemia: Secondary | ICD-10-CM | POA: Diagnosis not present

## 2014-10-13 DIAGNOSIS — E6609 Other obesity due to excess calories: Secondary | ICD-10-CM | POA: Diagnosis not present

## 2014-10-14 DIAGNOSIS — I739 Peripheral vascular disease, unspecified: Secondary | ICD-10-CM | POA: Diagnosis not present

## 2014-10-14 DIAGNOSIS — L03031 Cellulitis of right toe: Secondary | ICD-10-CM | POA: Diagnosis not present

## 2014-10-14 DIAGNOSIS — L600 Ingrowing nail: Secondary | ICD-10-CM | POA: Diagnosis not present

## 2014-10-14 DIAGNOSIS — L03032 Cellulitis of left toe: Secondary | ICD-10-CM | POA: Diagnosis not present

## 2014-10-16 DIAGNOSIS — E870 Hyperosmolality and hypernatremia: Secondary | ICD-10-CM | POA: Diagnosis not present

## 2014-10-21 ENCOUNTER — Encounter: Payer: Self-pay | Admitting: Cardiovascular Disease

## 2014-10-21 ENCOUNTER — Ambulatory Visit (INDEPENDENT_AMBULATORY_CARE_PROVIDER_SITE_OTHER): Payer: Medicare Other | Admitting: Cardiovascular Disease

## 2014-10-21 VITALS — BP 122/72 | HR 64 | Ht 70.5 in | Wt 219.4 lb

## 2014-10-21 DIAGNOSIS — Z79899 Other long term (current) drug therapy: Secondary | ICD-10-CM

## 2014-10-21 DIAGNOSIS — I2510 Atherosclerotic heart disease of native coronary artery without angina pectoris: Secondary | ICD-10-CM

## 2014-10-21 DIAGNOSIS — Z86718 Personal history of other venous thrombosis and embolism: Secondary | ICD-10-CM | POA: Diagnosis not present

## 2014-10-21 DIAGNOSIS — D494 Neoplasm of unspecified behavior of bladder: Secondary | ICD-10-CM | POA: Diagnosis not present

## 2014-10-21 DIAGNOSIS — N183 Chronic kidney disease, stage 3 unspecified: Secondary | ICD-10-CM

## 2014-10-21 DIAGNOSIS — Z9289 Personal history of other medical treatment: Secondary | ICD-10-CM

## 2014-10-21 DIAGNOSIS — I951 Orthostatic hypotension: Secondary | ICD-10-CM

## 2014-10-21 DIAGNOSIS — I5042 Chronic combined systolic (congestive) and diastolic (congestive) heart failure: Secondary | ICD-10-CM | POA: Diagnosis not present

## 2014-10-21 DIAGNOSIS — E785 Hyperlipidemia, unspecified: Secondary | ICD-10-CM

## 2014-10-21 DIAGNOSIS — R9431 Abnormal electrocardiogram [ECG] [EKG]: Secondary | ICD-10-CM

## 2014-10-21 DIAGNOSIS — Z87898 Personal history of other specified conditions: Secondary | ICD-10-CM

## 2014-10-21 DIAGNOSIS — E876 Hypokalemia: Secondary | ICD-10-CM

## 2014-10-21 DIAGNOSIS — I4581 Long QT syndrome: Secondary | ICD-10-CM

## 2014-10-21 MED ORDER — TORSEMIDE 20 MG PO TABS: ORAL_TABLET | ORAL | Status: DC

## 2014-10-21 NOTE — Progress Notes (Signed)
Patient ID: [name] [name], male   DOB: 14-Mar-1934, 81 y.o.   MRN: [id number]      SUBJECTIVE: The patient returns for posthospitalization follow-up. His diabetics have been discontinued due to cardiorenal syndrome. He had prolonged QTc (500 ms) and Lexapro dose was decreased. He was found to be in acute renal failure for necessary diuresis due to acute on chronic systolic heart failure. Bladder surgery was cancelled.  He says, "I feel great". Denies chest pain, leg swelling, and shortness of breath.  Here with [name] (daughter in law) and wife of over 51 years.   Review of Systems: As per "subjective", otherwise negative.  Allergies  Allergen Reactions  . Lorazepam     Hallucinations, worsens agitation  . Statins Other (See Comments)    MUSCLE ACHES    Current Outpatient Prescriptions  Medication Sig Dispense Refill  . aspirin 81 MG chewable tablet Chew 1 tablet (81 mg total) by mouth daily.    [name] donepezil (ARICEPT) 10 MG tablet Take 10 mg by mouth at bedtime.    [name] escitalopram (LEXAPRO) 20 MG tablet Take 20 mg by mouth daily.    . fludrocortisone (FLORINEF) 0.1 MG tablet Take 0.2 mg by mouth daily.     [name] levothyroxine (SYNTHROID, LEVOTHROID) 125 MCG tablet Take 125 mcg by mouth daily before breakfast.    . midodrine (PROAMATINE) 10 MG tablet Take 10 mg by mouth 2 (two) times daily.    . pantoprazole (PROTONIX) 40 MG tablet TAKE ONE TABLET EVERY MORNING 30 tablet 4  . polyethylene glycol (MIRALAX / GLYCOLAX) packet Take 17 g by mouth at bedtime.     No current facility-administered medications for this visit.    Past Medical History  Diagnosis Date  . Chronic constipation   . GERD (gastroesophageal reflux disease)   . Arthritis   . Hyperlipidemia   . Orthostatic hypotension 11/19/2013  . Coronary artery disease     a. 12/2013 - STEMI s/p CABGx4.  . Mild dementia   . Type 2 diabetes mellitus   . Hypothyroidism   . History of esophageal dilatation   . History of  gastritis   . History of hiatal hernia   . History of bladder cancer     hx  TCC low grade  . Prostate cancer     low-grade  . Kidney tumor     right  . BPH (benign prostatic hyperplasia)   . CKD (chronic kidney disease) stage 3, GFR 30-59 ml/min     a. h/o nephrectomy.  [name] History of kidney stones   . ED (erectile dysfunction)   . S/P CABG x 4     12-18-2013  . Bladder tumor   . Complication of anesthesia     HARD TO WAKE  . OSA on CPAP     mild osa   per sleep study 07-11-2009- patient states does not use regularly  . DVT (deep venous thrombosis)     a. s/p IVC filter 09/2014. Xarelto stopped at that time due to hematuria and hemoccult + stools.  . Myocardial infarction 12/2013  . Stroke     TIA  . Chronic combined systolic and diastolic CHF (congestive heart failure)     systolic and diastolic heart failure  . Headache   . Depression   . Hematuria   . Heme positive stool   . Anemia     Past Surgical History  Procedure Laterality Date  . Upper gastrointestinal endoscopy  04/06/2010  &  01-08-2013  w/ esophageal dilatation  . Hemorrhoid surgery    . Hand surgery      right  . Wrist fracture surgery      pins placed-left  . Cystoscopy with biopsy  01/09/2012    Procedure: CYSTOSCOPY WITH BIOPSY;  Surgeon: Marissa Nestle, MD;  Location: [facility] ORS;  Service: Urology;  Laterality: N/A;  Cystoscopy with Multiple Bladder Biopsies  . Esophagogastroduodenoscopy (egd) with esophageal dilation N/A 01/08/2013    Procedure: ESOPHAGOGASTRODUODENOSCOPY (EGD) WITH ESOPHAGEAL DILATION;  Surgeon: Rogene Houston, MD;  Location: [facility] SU[facility];  Service: Endoscopy;  Laterality: N/A;  120  . Left heart cath N/A 12/17/2013    Procedure: LEFT HEART CATH;  Surgeon: Leonie Man, MD;  Location: [facility] CATH LAB;  Service: Cardiovascular;  Laterality: N/A;  . Left heart catheterization with coronary angiogram N/A 12/17/2013    Procedure: LEFT HEART CATHETERIZATION WITH CORONARY ANGIOGRAM;  Surgeon:  Leonie Man, MD;  Location: [facility] CATH LAB;  Service: Cardiovascular;  Laterality: N/A;  . Colonoscopy with esophagogastroduodenoscopy (egd)  08-15-2006    w/ esophageal dilatation  . Umbilical hernia repair  04-24-2003  . Transurethral resection of bladder tumor  06-14-2004  . Transurethral resection of prostate  10-04-2004  . Cysto/  removal bladder calculus/  resection bladder tumor  10-16-2006  . Cystostomy w/ bladder biopsy  01-09-2012  . Cardiac catheterization  12-17-2013   dr Shanon Brow harding    severe disease RCA with thrombotic appearing subtotal occlusions/  95-99%  OM2/  diffuse disease LAD  and D1/  mild to moderate basal to mid inferior hypokinesis/  severe elevated LVEDP/  ef 45-50%  . Coronary artery bypass graft N/A 12/18/2013    Procedure: Coronary artery bypass grafting times four using left internal mammary artery and endoscopically harvested right saphenous vein graft;  Surgeon: Gaye Pollack, MD;  Location: [facility] OR;  Service: Open Heart Surgery;  Laterality: N/A;  . Transthoracic echocardiogram  11-14-2012    grade I diastolic dysfunction/  ef 55-60%/  trivial MR  and TR  . Cardiovascular stress test  08-09-2009  dr berry    mild to moderate perfusion defect in basal , mid, and apical inferior regions consistent with an infart/scar/   No evidence ischemia/  ef 51%/  no ECG changes/   low risk scan  . Transurethral resection of bladder tumor N/A 04/07/2014    Procedure: TRANSURETHRAL RESECTION OF BLADDER TUMOR (TURBT);  Surgeon: Arvil Persons, MD;  Location: [facility];  Service: Urology;  Laterality: N/A;  . Cystoscopy/retrograde/ureteroscopy Right 04/07/2014    Procedure: CYSTOSCOPY/RETROGRADE/URETEROSCOPY WITH BIOPSY OF RENAL PELVIS TUMOR;  Surgeon: Arvil Persons, MD;  Location: [facility];  Service: Urology;  Laterality: Right;  . Robot assisted laparoscopic nephrectomy Right 06/05/2014    Procedure: ROBOTIC ASSISTED LAPAROSCOPIC RIGHT  NEPHROURETERECTOMY, VENTRAL HERNIA REPAIR ;  Surgeon: Ardis Hughs, MD;  Location: [facility] ORS;  Service: Urology;  Laterality: Right;  . Cystoscopy w/ ureteral stent placement Right 06/05/2014    Procedure: CYSTOSCOPY ;  Surgeon: Ardis Hughs, MD;  Location: [facility] ORS;  Service: Urology;  Laterality: Right;  . Radiology with anesthesia N/A 06/22/2014    Procedure: MRI BRAIN W/O CONTRAST;  Surgeon: Medication Radiologist, MD;  Location: [place];  Service: Radiology;  Laterality: N/A;  . Eye surgery      cataract surgery  . Hernia repair  12/4707    umbilical hernia repair at time of nephrectomy    History  Social History  . Marital Status: Divorced    Spouse Name: N/A  . Number of Children: 3  . Years of Education: HS   Occupational History  .     Social History Main Topics  . Smoking status: Former Smoker -- 1.00 packs/day for 4 years    Types: Cigarettes    Start date: 09/08/1952    Quit date: 09/09/1959  . Smokeless tobacco: Former Systems developer    Types: [place] date: 01/03/1960  . Alcohol Use: No  . Drug Use: No  . Sexual Activity: Yes    Birth Control/ Protection: None   Other Topics Concern  . Not on file   Social History Narrative   Patient is single and lives alone.   Patient is retired.   Patient has a high school education.   Patient is right-handed.   Patient drinks one cup of coffee and one cup of soda or tea daily.   Patient has three adult children.      Lives in [place] round the clock care     Filed Vitals:   10/21/14 1342  Pulse: 64  Height: 5' 10.5" (1.791 m)  Weight: 219 lb 6.4 oz (99.519 kg)  SpO2: 98%    PHYSICAL EXAM General: NAD HEENT: Normal. Neck: No JVD, no thyromegaly. Lungs: Clear to auscultation bilaterally with normal respiratory effort. CV: Nondisplaced PMI.  Regular rate and rhythm, normal S1/S2, no S3/S4, no murmur. No pretibial or periankle edema.   Abdomen: Soft, nontender, no distention.  Neurologic: Alert and  oriented.  Psych: Normal affect. Skin: Normal. Musculoskeletal: No gross deformities. Extremities: No clubbing or cyanosis.   ECG: Most recent ECG reviewed.      ASSESSMENT AND PLAN: 1. Chronic systolic and diastolic heart failure: Remarkably euvolemic without diuretics. Instructed to take 20-40 mg prn for leg swelling/SOB and to inform my office if this were to occur.  2. Right leg DVT s/p IVC filter on 09/17/14: Not on anticoagulation due to bladder tumor and hematuria.  3. Orthostatic hypotension: On midodrine and Florinef.  4. CKD stage 3: Solitary kidney. Not on diuretics.  5. CAD s/p 4-v CABG: Symptomatically stable. Continue ASA 81 mg.  6. Bladder tumors: Being monitored by urology. Has had scarce, intermittent hematuria.  7. Hypokalemia: Being monitored by PCP. On KCl.  Dispo: f/u 8-10 weeks with Ermalinda Barrios PA-C.  Time spent: 40 minutes, of which greater than 50% was spent reviewing symptoms, relevant blood tests and studies, and discussing management plan with the patient.  Kate Sable, M.D., F.A.C.C.

## 2014-10-21 NOTE — Patient Instructions (Signed)
Your physician recommends that you schedule a follow-up appointment in: 10-12 weeks with M.Lenze PA-C    Take Torsemide 20-40 mg daily as needed for leg swelling    Thank you for choosing [facility] !

## 2014-10-30 DIAGNOSIS — I259 Chronic ischemic heart disease, unspecified: Secondary | ICD-10-CM | POA: Diagnosis not present

## 2014-10-30 DIAGNOSIS — M6281 Muscle weakness (generalized): Secondary | ICD-10-CM | POA: Diagnosis not present

## 2014-11-30 DIAGNOSIS — C672 Malignant neoplasm of lateral wall of bladder: Secondary | ICD-10-CM | POA: Diagnosis not present

## 2014-11-30 DIAGNOSIS — M6281 Muscle weakness (generalized): Secondary | ICD-10-CM | POA: Diagnosis not present

## 2014-11-30 DIAGNOSIS — I259 Chronic ischemic heart disease, unspecified: Secondary | ICD-10-CM | POA: Diagnosis not present

## 2014-12-02 ENCOUNTER — Other Ambulatory Visit: Payer: Self-pay | Admitting: Urology

## 2014-12-21 ENCOUNTER — Encounter (HOSPITAL_COMMUNITY)
Admission: RE | Admit: 2014-12-21 | Discharge: 2014-12-21 | Disposition: A | Discharge status: Home / Self Care | Payer: Medicare Other | Source: Ambulatory Visit | Attending: Urology | Admitting: Urology

## 2014-12-21 ENCOUNTER — Encounter (HOSPITAL_COMMUNITY): Payer: Self-pay

## 2014-12-21 DIAGNOSIS — Z905 Acquired absence of kidney: Secondary | ICD-10-CM | POA: Diagnosis not present

## 2014-12-21 DIAGNOSIS — E1151 Type 2 diabetes mellitus with diabetic peripheral angiopathy without gangrene: Secondary | ICD-10-CM | POA: Diagnosis not present

## 2014-12-21 DIAGNOSIS — I252 Old myocardial infarction: Secondary | ICD-10-CM | POA: Diagnosis not present

## 2014-12-21 DIAGNOSIS — R3915 Urgency of urination: Secondary | ICD-10-CM | POA: Diagnosis present

## 2014-12-21 DIAGNOSIS — Z8673 Personal history of transient ischemic attack (TIA), and cerebral infarction without residual deficits: Secondary | ICD-10-CM | POA: Diagnosis not present

## 2014-12-21 DIAGNOSIS — Z79899 Other long term (current) drug therapy: Secondary | ICD-10-CM | POA: Diagnosis not present

## 2014-12-21 DIAGNOSIS — F329 Major depressive disorder, single episode, unspecified: Secondary | ICD-10-CM | POA: Diagnosis not present

## 2014-12-21 DIAGNOSIS — K219 Gastro-esophageal reflux disease without esophagitis: Secondary | ICD-10-CM | POA: Diagnosis not present

## 2014-12-21 DIAGNOSIS — M1990 Unspecified osteoarthritis, unspecified site: Secondary | ICD-10-CM | POA: Diagnosis not present

## 2014-12-21 DIAGNOSIS — I5042 Chronic combined systolic (congestive) and diastolic (congestive) heart failure: Secondary | ICD-10-CM | POA: Diagnosis not present

## 2014-12-21 DIAGNOSIS — D649 Anemia, unspecified: Secondary | ICD-10-CM | POA: Diagnosis not present

## 2014-12-21 DIAGNOSIS — Z8553 Personal history of malignant neoplasm of renal pelvis: Secondary | ICD-10-CM | POA: Diagnosis not present

## 2014-12-21 DIAGNOSIS — E039 Hypothyroidism, unspecified: Secondary | ICD-10-CM | POA: Diagnosis not present

## 2014-12-21 DIAGNOSIS — F0390 Unspecified dementia without behavioral disturbance: Secondary | ICD-10-CM | POA: Diagnosis not present

## 2014-12-21 DIAGNOSIS — N183 Chronic kidney disease, stage 3 (moderate): Secondary | ICD-10-CM | POA: Diagnosis not present

## 2014-12-21 DIAGNOSIS — Z7982 Long term (current) use of aspirin: Secondary | ICD-10-CM | POA: Diagnosis not present

## 2014-12-21 DIAGNOSIS — Z951 Presence of aortocoronary bypass graft: Secondary | ICD-10-CM | POA: Diagnosis not present

## 2014-12-21 DIAGNOSIS — C679 Malignant neoplasm of bladder, unspecified: Secondary | ICD-10-CM | POA: Diagnosis not present

## 2014-12-21 DIAGNOSIS — Z23 Encounter for immunization: Secondary | ICD-10-CM | POA: Diagnosis not present

## 2014-12-21 DIAGNOSIS — I2510 Atherosclerotic heart disease of native coronary artery without angina pectoris: Secondary | ICD-10-CM | POA: Diagnosis not present

## 2014-12-21 DIAGNOSIS — E1122 Type 2 diabetes mellitus with diabetic chronic kidney disease: Secondary | ICD-10-CM | POA: Diagnosis not present

## 2014-12-21 DIAGNOSIS — K449 Diaphragmatic hernia without obstruction or gangrene: Secondary | ICD-10-CM | POA: Diagnosis not present

## 2014-12-21 DIAGNOSIS — Z86718 Personal history of other venous thrombosis and embolism: Secondary | ICD-10-CM | POA: Diagnosis not present

## 2014-12-21 DIAGNOSIS — E785 Hyperlipidemia, unspecified: Secondary | ICD-10-CM | POA: Diagnosis not present

## 2014-12-21 DIAGNOSIS — G4733 Obstructive sleep apnea (adult) (pediatric): Secondary | ICD-10-CM | POA: Diagnosis not present

## 2014-12-21 HISTORY — DX: Anxiety disorder, unspecified: F41.9

## 2014-12-21 LAB — CBC
HCT: 34.9 % — ABNORMAL LOW (ref 39.0–52.0)
Hemoglobin: 10.4 g/dL — ABNORMAL LOW (ref 13.0–17.0)
MCH: 24.2 pg — ABNORMAL LOW (ref 26.0–34.0)
MCHC: 29.8 g/dL — ABNORMAL LOW (ref 30.0–36.0)
MCV: 81.4 fL (ref 78.0–100.0)
PLATELETS: 192 10*3/uL (ref 150–400)
RBC: 4.29 MIL/uL (ref 4.22–5.81)
RDW: 18.9 % — AB (ref 11.5–15.5)
WBC: 7.9 10*3/uL (ref 4.0–10.5)

## 2014-12-21 LAB — BASIC METABOLIC PANEL
Anion gap: 8 (ref 5–15)
BUN: 32 mg/dL — AB (ref 6–20)
CO2: 33 mmol/L — ABNORMAL HIGH (ref 22–32)
CREATININE: 1.95 mg/dL — AB (ref 0.61–1.24)
Calcium: 9 mg/dL (ref 8.9–10.3)
Chloride: 103 mmol/L (ref 101–111)
GFR calc Af Amer: 36 mL/min — ABNORMAL LOW (ref >60)
GFR, EST NON AFRICAN AMERICAN: 31 mL/min — AB (ref >60)
Glucose, Bld: 96 mg/dL (ref 65–99)
Potassium: 4.6 mmol/L (ref 3.5–5.1)
SODIUM: 144 mmol/L (ref 135–145)

## 2014-12-21 NOTE — Progress Notes (Signed)
EKG- 10/03/2014 in EPIC  08/17/2014- ECHO 2V CXR- 09/18/14- EPIC  10/21/14- LOV- Cardiology- EPIC - prolonged QT - medication changes

## 2014-12-21 NOTE — Patient Instructions (Addendum)
[name] [name]  12/21/2014   Your procedure is scheduled on:    12/24/2014    Report to [facility] Main  Entrance take [facility]  elevators to 3rd floor to  [place] at    1045 AM.  Call this number if you have problems the morning of surgery [phone]   Remember: ONLY 1 PERSON MAY GO WITH YOU TO SHORT STAY TO GET  READY MORNING OF [name].  Do not eat food or drink liquids :After Midnight.     Take these medicines the morning of surgery with A SIP OF WATER:   Florinef, lexapro, synthroid, midodrine, myrbetriq, protonix                                You may not have any metal on your body including hair pins and              piercings  Do not wear jewelry, , lotions, powders or perfumes, deodorant                      Men may shave face and neck.   Do not bring valuables to the hospital. [place].  Contacts, dentures or bridgework may not be worn into surgery.  Leave suitcase in the car. After surgery it may be brought to your room.        Special Instructions: coughing and deep breathing exercises, leg exercises               Please read over the following fact sheets you were given: _____________________________________________________________________             [facility] - Preparing for Surgery Before surgery, you can play an important role.  Because skin is not sterile, your skin needs to be as free of germs as possible.  You can reduce the number of germs on your skin by washing with CHG (chlorahexidine gluconate) soap before surgery.  CHG is an antiseptic cleaner which kills germs and bonds with the skin to continue killing germs even after washing. Please DO NOT use if you have an allergy to CHG or antibacterial soaps.  If your skin becomes reddened/irritated stop using the CHG and inform your nurse when you arrive at Short Stay. Do not shave (including legs and underarms) for at least  48 hours prior to the first CHG shower.  You may shave your face/neck. Please follow these instructions carefully:  1.  Shower with CHG Soap the night before surgery and the  morning of Surgery.  2.  If you choose to wash your hair, wash your hair first as usual with your  normal  shampoo.  3.  After you shampoo, rinse your hair and body thoroughly to remove the  shampoo.                           4.  Use CHG as you would any other liquid soap.  You can apply chg directly  to the skin and wash                       Gently with a scrungie or clean washcloth.  5.  Apply the CHG Soap to your body ONLY FROM THE NECK DOWN.   Do not use on face/ open                           Wound or open sores. Avoid contact with eyes, ears mouth and genitals (private parts).                       Wash face,  Genitals (private parts) with your normal soap.             6.  Wash thoroughly, paying special attention to the area where your surgery  will be performed.  7.  Thoroughly rinse your body with warm water from the neck down.  8.  DO NOT shower/wash with your normal soap after using and rinsing off  the CHG Soap.                9.  Pat yourself dry with a clean towel.            10.  Wear clean pajamas.            11.  Place clean sheets on your bed the night of your first shower and do not  sleep with pets. Day of Surgery : Do not apply any lotions/deodorants the morning of surgery.  Please wear clean clothes to the hospital/surgery center.  FAILURE TO FOLLOW THESE INSTRUCTIONS MAY RESULT IN THE CANCELLATION OF YOUR SURGERY PATIENT SIGNATURE_________________________________  NURSE SIGNATURE__________________________________  ________________________________________________________________________

## 2014-12-21 NOTE — Progress Notes (Signed)
BMP results done 12/21/14 faxed via EPIC to Dr Louis Meckel.

## 2014-12-21 NOTE — Progress Notes (Signed)
CBC results done 12/21/14 faxed via EPIC to Dr Louis Meckel.

## 2014-12-22 NOTE — Progress Notes (Signed)
Final EKG in EPIC done 12/21/14.

## 2014-12-23 ENCOUNTER — Encounter (HOSPITAL_COMMUNITY): Payer: Self-pay

## 2014-12-23 LAB — URINE CULTURE

## 2014-12-23 NOTE — Progress Notes (Signed)
Spoke with daughter in law , [name], by phone and she will bring CPAP mask and tubing on 12/24/2014  Even though patient willl put it on at nite but will not wear all nite.

## 2014-12-23 NOTE — Progress Notes (Signed)
Anesthesia ( Dr Conrad [place] ) made awre of EKG in 09/2014 and final EKG on 12/21/2014.  No further orders given.  Also asked Dr Conrad Novelty regarding am medications to be taken am of surgery ( Midodrine and Florinef).  Okay for patient to take these medications am of surgery per Dr Conrad Garrison.  Dr Conrad Willard saw patient preop instruction sheet with entire list of medications to be taken am of surgery. No further orders given.

## 2014-12-23 NOTE — Progress Notes (Signed)
Recalled micro department at [facility] for urine culture results. Micro stated final result would be placed in computer.

## 2014-12-23 NOTE — Progress Notes (Signed)
Called Micro at [facility] regarding urine culture results not being back .  Micro to call back.

## 2014-12-24 ENCOUNTER — Encounter (HOSPITAL_COMMUNITY)
Admission: RE | Disposition: A | Discharge status: Home / Self Care | Payer: Self-pay | Source: Ambulatory Visit | Attending: Urology

## 2014-12-24 ENCOUNTER — Ambulatory Visit (HOSPITAL_COMMUNITY): Payer: Medicare Other | Admitting: Anesthesiology

## 2014-12-24 ENCOUNTER — Observation Stay (HOSPITAL_COMMUNITY)
Admission: RE | Admit: 2014-12-24 | Discharge: 2014-12-25 | Disposition: A | Discharge status: Home / Self Care | Payer: Medicare Other | Source: Ambulatory Visit | Attending: Urology | Admitting: Urology

## 2014-12-24 ENCOUNTER — Encounter (HOSPITAL_COMMUNITY): Payer: Self-pay | Admitting: *Deleted

## 2014-12-24 DIAGNOSIS — F329 Major depressive disorder, single episode, unspecified: Secondary | ICD-10-CM | POA: Insufficient documentation

## 2014-12-24 DIAGNOSIS — Z23 Encounter for immunization: Secondary | ICD-10-CM | POA: Diagnosis not present

## 2014-12-24 DIAGNOSIS — Z79899 Other long term (current) drug therapy: Secondary | ICD-10-CM | POA: Insufficient documentation

## 2014-12-24 DIAGNOSIS — M1990 Unspecified osteoarthritis, unspecified site: Secondary | ICD-10-CM | POA: Insufficient documentation

## 2014-12-24 DIAGNOSIS — C678 Malignant neoplasm of overlapping sites of bladder: Secondary | ICD-10-CM

## 2014-12-24 DIAGNOSIS — Z8553 Personal history of malignant neoplasm of renal pelvis: Secondary | ICD-10-CM | POA: Diagnosis not present

## 2014-12-24 DIAGNOSIS — E785 Hyperlipidemia, unspecified: Secondary | ICD-10-CM | POA: Diagnosis not present

## 2014-12-24 DIAGNOSIS — D649 Anemia, unspecified: Secondary | ICD-10-CM | POA: Insufficient documentation

## 2014-12-24 DIAGNOSIS — Z951 Presence of aortocoronary bypass graft: Secondary | ICD-10-CM | POA: Insufficient documentation

## 2014-12-24 DIAGNOSIS — K449 Diaphragmatic hernia without obstruction or gangrene: Secondary | ICD-10-CM | POA: Insufficient documentation

## 2014-12-24 DIAGNOSIS — E039 Hypothyroidism, unspecified: Secondary | ICD-10-CM | POA: Diagnosis not present

## 2014-12-24 DIAGNOSIS — I2510 Atherosclerotic heart disease of native coronary artery without angina pectoris: Secondary | ICD-10-CM | POA: Insufficient documentation

## 2014-12-24 DIAGNOSIS — E1151 Type 2 diabetes mellitus with diabetic peripheral angiopathy without gangrene: Secondary | ICD-10-CM | POA: Insufficient documentation

## 2014-12-24 DIAGNOSIS — Z7982 Long term (current) use of aspirin: Secondary | ICD-10-CM | POA: Diagnosis not present

## 2014-12-24 DIAGNOSIS — G4733 Obstructive sleep apnea (adult) (pediatric): Secondary | ICD-10-CM | POA: Insufficient documentation

## 2014-12-24 DIAGNOSIS — Z8673 Personal history of transient ischemic attack (TIA), and cerebral infarction without residual deficits: Secondary | ICD-10-CM | POA: Insufficient documentation

## 2014-12-24 DIAGNOSIS — Z86718 Personal history of other venous thrombosis and embolism: Secondary | ICD-10-CM | POA: Insufficient documentation

## 2014-12-24 DIAGNOSIS — Z905 Acquired absence of kidney: Secondary | ICD-10-CM | POA: Insufficient documentation

## 2014-12-24 DIAGNOSIS — C679 Malignant neoplasm of bladder, unspecified: Secondary | ICD-10-CM | POA: Diagnosis not present

## 2014-12-24 DIAGNOSIS — E1122 Type 2 diabetes mellitus with diabetic chronic kidney disease: Secondary | ICD-10-CM | POA: Diagnosis not present

## 2014-12-24 DIAGNOSIS — K219 Gastro-esophageal reflux disease without esophagitis: Secondary | ICD-10-CM | POA: Insufficient documentation

## 2014-12-24 DIAGNOSIS — F0390 Unspecified dementia without behavioral disturbance: Secondary | ICD-10-CM | POA: Insufficient documentation

## 2014-12-24 DIAGNOSIS — N183 Chronic kidney disease, stage 3 (moderate): Secondary | ICD-10-CM | POA: Diagnosis not present

## 2014-12-24 DIAGNOSIS — I252 Old myocardial infarction: Secondary | ICD-10-CM | POA: Diagnosis not present

## 2014-12-24 DIAGNOSIS — N3289 Other specified disorders of bladder: Secondary | ICD-10-CM | POA: Diagnosis not present

## 2014-12-24 DIAGNOSIS — Z87891 Personal history of nicotine dependence: Secondary | ICD-10-CM | POA: Diagnosis not present

## 2014-12-24 DIAGNOSIS — I5042 Chronic combined systolic (congestive) and diastolic (congestive) heart failure: Secondary | ICD-10-CM | POA: Insufficient documentation

## 2014-12-24 HISTORY — PX: TRANSURETHRAL RESECTION OF BLADDER TUMOR: SHX2575

## 2014-12-24 LAB — BASIC METABOLIC PANEL
Anion gap: 7 (ref 5–15)
BUN: 35 mg/dL — ABNORMAL HIGH (ref 6–20)
CALCIUM: 8.7 mg/dL — AB (ref 8.9–10.3)
CO2: 31 mmol/L (ref 22–32)
CREATININE: 2.23 mg/dL — AB (ref 0.61–1.24)
Chloride: 103 mmol/L (ref 101–111)
GFR calc non Af Amer: 27 mL/min — ABNORMAL LOW (ref >60)
GFR, EST AFRICAN AMERICAN: 31 mL/min — AB (ref >60)
Glucose, Bld: 105 mg/dL — ABNORMAL HIGH (ref 65–99)
Potassium: 4.5 mmol/L (ref 3.5–5.1)
SODIUM: 141 mmol/L (ref 135–145)

## 2014-12-24 LAB — GLUCOSE, CAPILLARY: Glucose-Capillary: 103 mg/dL — ABNORMAL HIGH (ref 65–99)

## 2014-12-24 SURGERY — TURBT (TRANSURETHRAL RESECTION OF BLADDER TUMOR)
Anesthesia: Spinal

## 2014-12-24 MED ORDER — PANTOPRAZOLE SODIUM 40 MG PO TBEC
40.0000 mg | DELAYED_RELEASE_TABLET | Freq: Every morning | ORAL | Status: DC
  Filled 2014-12-24: qty 1

## 2014-12-24 MED ORDER — CIPROFLOXACIN IN D5W 400 MG/200ML IV SOLN
INTRAVENOUS | Status: AC
  Filled 2014-12-24: qty 200

## 2014-12-24 MED ORDER — PROPOFOL 10 MG/ML IV BOLUS
INTRAVENOUS | Status: DC | PRN
  Administered 2014-12-24: 20 mg via INTRAVENOUS

## 2014-12-24 MED ORDER — INFLUENZA VAC SPLIT QUAD 0.5 ML IM SUSY
0.5000 mL | PREFILLED_SYRINGE | INTRAMUSCULAR | Status: AC
  Administered 2014-12-25: 0.5 mL via INTRAMUSCULAR
  Filled 2014-12-24 (×2): qty 0.5

## 2014-12-24 MED ORDER — POLYETHYLENE GLYCOL 3350 17 G PO PACK
17.0000 g | PACK | Freq: Every day | ORAL | Status: DC
  Administered 2014-12-24: 17 g via ORAL
  Filled 2014-12-24: qty 1

## 2014-12-24 MED ORDER — BELLADONNA ALKALOIDS-OPIUM 16.2-60 MG RE SUPP
1.0000 | Freq: Three times a day (TID) | RECTAL | Status: DC | PRN
  Administered 2014-12-25 (×2): 1 via RECTAL
  Filled 2014-12-24 (×2): qty 1

## 2014-12-24 MED ORDER — ACETAMINOPHEN 10 MG/ML IV SOLN
1000.0000 mg | Freq: Four times a day (QID) | INTRAVENOUS | Status: DC
  Administered 2014-12-24 – 2014-12-25 (×3): 1000 mg via INTRAVENOUS
  Filled 2014-12-24 (×4): qty 100

## 2014-12-24 MED ORDER — BUPIVACAINE IN DEXTROSE 0.75-8.25 % IT SOLN
INTRATHECAL | Status: DC | PRN
  Administered 2014-12-24: 1.5 mL via INTRATHECAL

## 2014-12-24 MED ORDER — ZIPRASIDONE HCL 20 MG PO CAPS
20.0000 mg | ORAL_CAPSULE | Freq: Every morning | ORAL | Status: DC
  Filled 2014-12-24: qty 1

## 2014-12-24 MED ORDER — LACTATED RINGERS IV SOLN
INTRAVENOUS | Status: DC
Start: 2014-12-24 — End: 2014-12-24
  Administered 2014-12-24: 17:00:00 via INTRAVENOUS
  Administered 2014-12-24: 1000 mL via INTRAVENOUS

## 2014-12-24 MED ORDER — BELLADONNA ALKALOIDS-OPIUM 16.2-60 MG RE SUPP
RECTAL | Status: AC
  Filled 2014-12-24: qty 1

## 2014-12-24 MED ORDER — LIDOCAINE HCL 1 % IJ SOLN
INTRAMUSCULAR | Status: AC
  Filled 2014-12-24: qty 20

## 2014-12-24 MED ORDER — ONDANSETRON HCL 4 MG/2ML IJ SOLN
INTRAMUSCULAR | Status: DC | PRN
  Administered 2014-12-24: 4 mg via INTRAVENOUS

## 2014-12-24 MED ORDER — ONDANSETRON HCL 4 MG/2ML IJ SOLN
INTRAMUSCULAR | Status: AC
  Filled 2014-12-24: qty 2

## 2014-12-24 MED ORDER — MIDODRINE HCL 5 MG PO TABS
10.0000 mg | ORAL_TABLET | Freq: Two times a day (BID) | ORAL | Status: DC
  Administered 2014-12-24: 10 mg via ORAL
  Filled 2014-12-24 (×2): qty 2

## 2014-12-24 MED ORDER — LIDOCAINE HCL (CARDIAC) 20 MG/ML IV SOLN
INTRAVENOUS | Status: AC
  Filled 2014-12-24: qty 5

## 2014-12-24 MED ORDER — LIDOCAINE HCL (CARDIAC) 20 MG/ML IV SOLN
INTRAVENOUS | Status: DC | PRN
  Administered 2014-12-24: 20 mg via INTRAVENOUS

## 2014-12-24 MED ORDER — POTASSIUM CHLORIDE CRYS ER 20 MEQ PO TBCR: 20.0000 meq | EXTENDED_RELEASE_TABLET | Freq: Every morning | ORAL | Status: DC

## 2014-12-24 MED ORDER — LEVOTHYROXINE SODIUM 125 MCG PO TABS
125.0000 ug | ORAL_TABLET | Freq: Every day | ORAL | Status: DC
  Administered 2014-12-25: 125 ug via ORAL
  Filled 2014-12-24: qty 1

## 2014-12-24 MED ORDER — FENTANYL CITRATE (PF) 100 MCG/2ML IJ SOLN
INTRAMUSCULAR | Status: DC | PRN
  Administered 2014-12-24 (×4): 25 ug via INTRAVENOUS

## 2014-12-24 MED ORDER — FENTANYL CITRATE (PF) 100 MCG/2ML IJ SOLN
INTRAMUSCULAR | Status: AC
  Filled 2014-12-24: qty 4

## 2014-12-24 MED ORDER — LACTATED RINGERS IV SOLN: INTRAVENOUS | Status: DC

## 2014-12-24 MED ORDER — FLUDROCORTISONE ACETATE 0.1 MG PO TABS
0.2000 mg | ORAL_TABLET | Freq: Every morning | ORAL | Status: DC
  Filled 2014-12-24: qty 2

## 2014-12-24 MED ORDER — PROPOFOL 10 MG/ML IV BOLUS
INTRAVENOUS | Status: AC
  Filled 2014-12-24: qty 20

## 2014-12-24 MED ORDER — DONEPEZIL HCL 10 MG PO TABS
10.0000 mg | ORAL_TABLET | Freq: Every day | ORAL | Status: DC
  Administered 2014-12-24: 10 mg via ORAL
  Filled 2014-12-24: qty 1

## 2014-12-24 MED ORDER — BELLADONNA ALKALOIDS-OPIUM 16.2-60 MG RE SUPP
RECTAL | Status: DC | PRN
  Administered 2014-12-24: 1 via RECTAL

## 2014-12-24 MED ORDER — PROPOFOL INFUSION 10 MG/ML OPTIME
INTRAVENOUS | Status: DC | PRN
  Administered 2014-12-24: 50 ug/kg/min via INTRAVENOUS

## 2014-12-24 MED ORDER — MIRABEGRON ER 50 MG PO TB24
50.0000 mg | ORAL_TABLET | Freq: Every morning | ORAL | Status: DC
  Filled 2014-12-24: qty 1

## 2014-12-24 MED ORDER — SODIUM CHLORIDE 0.9 % IV SOLN
INTRAVENOUS | Status: DC
  Administered 2014-12-24: 19:00:00 via INTRAVENOUS

## 2014-12-24 MED ORDER — SODIUM CHLORIDE 0.9 % IR SOLN
Status: DC | PRN
  Administered 2014-12-24: 9000 mL

## 2014-12-24 MED ORDER — ONDANSETRON HCL 4 MG/2ML IJ SOLN
4.0000 mg | Freq: Once | INTRAMUSCULAR | Status: AC
  Administered 2014-12-24: 4 mg via INTRAVENOUS

## 2014-12-24 MED ORDER — TORSEMIDE 20 MG PO TABS
40.0000 mg | ORAL_TABLET | Freq: Every day | ORAL | Status: DC
  Administered 2014-12-24: 40 mg via ORAL
  Filled 2014-12-24 (×2): qty 2

## 2014-12-24 MED ORDER — ESCITALOPRAM OXALATE 20 MG PO TABS
20.0000 mg | ORAL_TABLET | Freq: Every morning | ORAL | Status: DC
  Filled 2014-12-24: qty 1

## 2014-12-24 MED ORDER — HYDROMORPHONE HCL 1 MG/ML IJ SOLN: 0.2500 mg | INTRAMUSCULAR | Status: DC | PRN

## 2014-12-24 MED ORDER — CIPROFLOXACIN IN D5W 400 MG/200ML IV SOLN
400.0000 mg | INTRAVENOUS | Status: AC
  Administered 2014-12-24: 400 mg via INTRAVENOUS

## 2014-12-24 SURGICAL SUPPLY — 21 items
BAG URINE DRAINAGE (UROLOGICAL SUPPLIES) ×3 IMPLANT
BAG URO CATCHER STRL LF (DRAPE) ×3 IMPLANT
CATH FOLEY 3WAY 30CC 22FR (CATHETERS) ×2 IMPLANT
CATH FOLEY 3WAY 30CC 24FR (CATHETERS)
CATH URTH STD 24FR FL 3W 2 (CATHETERS) IMPLANT
ELECT REM PT RETURN 9FT ADLT (ELECTROSURGICAL) ×3
ELECTRODE REM PT RTRN 9FT ADLT (ELECTROSURGICAL) ×1 IMPLANT
EVACUATOR MICROVAS BLADDER (UROLOGICAL SUPPLIES) ×3 IMPLANT
GLOVE BIO SURGEON STRL SZ7.5 (GLOVE) ×3 IMPLANT
GOWN STRL REUS W/ TWL XL LVL3 (GOWN DISPOSABLE) ×1 IMPLANT
GOWN STRL REUS W/TWL XL LVL3 (GOWN DISPOSABLE) ×6 IMPLANT
HOLDER FOLEY CATH W/STRAP (MISCELLANEOUS) IMPLANT
KIT ASPIRATION TUBING (SET/KITS/TRAYS/PACK) ×1 IMPLANT
LOOP CUT BIPOLAR 24F LRG (ELECTROSURGICAL) ×2 IMPLANT
MANIFOLD NEPTUNE II (INSTRUMENTS) ×3 IMPLANT
PACK CYSTO (CUSTOM PROCEDURE TRAY) ×3 IMPLANT
PLUG CATH AND CAP STER (CATHETERS) ×2 IMPLANT
SYR 30ML LL (SYRINGE) ×2 IMPLANT
SYRINGE IRR TOOMEY STRL 70CC (SYRINGE) ×3 IMPLANT
TUBING CONNECTING 10 (TUBING) ×2 IMPLANT
TUBING CONNECTING 10' (TUBING) ×1

## 2014-12-24 NOTE — Progress Notes (Signed)
Per son pt will not use CPAP. Son stated pt does not use the one he has at home.

## 2014-12-24 NOTE — Anesthesia Postprocedure Evaluation (Signed)
  Anesthesia Post-op Note  Patient: [name] [name]  Procedure(s) Performed: Procedure(s) (LRB): TRANSURETHRAL RESECTION OF BLADDER TUMOR (TURBT) (N/A)  Patient Location: PACU  Anesthesia Type: Spinal  Level of Consciousness: awake and alert   Airway and Oxygen Therapy: Patient Spontanous Breathing  Post-op Pain: mild  Post-op Assessment: Post-op Vital signs reviewed, Patient's Cardiovascular Status Stable, Respiratory Function Stable, Patent Airway and No signs of Nausea or vomiting  Last Vitals:  Filed Vitals:   12/24/14 1500  BP: 137/91  Pulse: 73  Temp: 36.4 C  Resp: 13    Post-op Vital Signs: stable   Complications: No apparent anesthesia complications

## 2014-12-24 NOTE — Anesthesia Procedure Notes (Signed)
Spinal Patient location during procedure: OR Start time: 12/24/2014 12:50 PM End time: 12/24/2014 12:54 PM Staffing Anesthesiologist: Rod Mae Resident/CRNA: Lajuana Carry E Performed by: resident/CRNA  Preanesthetic Checklist Completed: patient identified, site marked, surgical consent, pre-op evaluation, timeout performed, IV checked, risks and benefits discussed and monitors and equipment checked Spinal Block Patient position: sitting Prep: Betadine Patient monitoring: heart rate, continuous pulse ox and blood pressure Approach: midline Location: L3-4 Injection technique: single-shot Needle Needle type: Spinocan  Needle gauge: 22 G Needle length: 9 cm Assessment Sensory level: T6 Additional Notes Expiration date of kit checked and confirmed. Patient tolerated procedure well, without complications.

## 2014-12-24 NOTE — Transfer of Care (Signed)
Immediate Anesthesia Transfer of Care Note  Patient: [name] [name]  Procedure(s) Performed: Procedure(s): TRANSURETHRAL RESECTION OF BLADDER TUMOR (TURBT) (N/A)  Patient Location: PACU  Anesthesia Type:Spinal  Level of Consciousness:  sedated, patient cooperative and responds to stimulation  Airway & Oxygen Therapy:Patient Spontanous Breathing and Patient connected to face mask oxgen  Post-op Assessment:  Report given to PACU RN and Post -op Vital signs reviewed and stable  Post vital signs:  Reviewed and stable  Last Vitals: There were no vitals filed for this visit.  Complications: No apparent anesthesia complications

## 2014-12-24 NOTE — Interval H&P Note (Signed)
History and Physical Interval Note: Left expiratory wheezes, right lung CTA RRR Remains confused, consent signed by his Daughter in law  12/24/2014 12:43 PM  [name] [name]  has presented today for surgery, with the diagnosis of RECURRENT BLADDER TUMORS  The various methods of treatment have been discussed with the patient and family. After consideration of risks, benefits and other options for treatment, the patient has consented to  Procedure(s): TRANSURETHRAL RESECTION OF BLADDER TUMOR (TURBT) (N/A) as a surgical intervention .  The patient's history has been reviewed, patient examined, no change in status, stable for surgery.  I have reviewed the patient's chart and labs.  Questions were answered to the patient's satisfaction.     Louis Meckel W

## 2014-12-24 NOTE — Op Note (Signed)
Preoperative diagnosis:  1. Bladder cancer involving multiple sites of the bladder   Postoperative diagnosis:  1. Same   Procedure: 1. TURBT, 2-5 cm  Surgeon: Ardis Hughs, MD  Anesthesia: Spinal  Complications: None  Intraoperative findings: The patient's bladder was studded with small papillary lesions. These do not appear to be invasive. However, I was unable to resect some of the tumors that were located in the anterior dome of the bladder.  EBL: Minimal  Specimens: None  Indication: [name] [name] is a 81 y.o. patient with history of high-grade bladder cancer.  After reviewing the management options for treatment, he elected to proceed with the above surgical procedure(s). We have discussed the potential benefits and risks of the procedure, side effects of the proposed treatment, the likelihood of the patient achieving the goals of the procedure, and any potential problems that might occur during the procedure or recuperation. Informed consent has been obtained.  Description of procedure:  The patient was taken to the operating room and spinal anesthesia was induced.  The patient was placed in the dorsal lithotomy position, prepped and draped in the usual sterile fashion, and preoperative antibiotics were administered. A preoperative time-out was performed.   I started by introducing a 26 French resectoscope sheath using the visual obturator. I then exchanged this for loop cautery and receded with resection of the tumors that were resectable. There was a significant amount of tumor on the anterior surface at the dome which I was unable to reach with the scope despite pushing down on the bladder and providing suprapubic pressure. Further, the patient was under spinal anesthesia given his associated comorbidities and I was unable to safely resect this area without risk of significant perforation and further morbidity which I felt was unnecessary. I such, I terminated the case  having resected 75% of the patient's small papillary lesions. I placed a 24 French three-way Foley catheter and capped the inflow port. The patient was subsequently awoken and returned the PACU in stable condition.  Disposition: The patient is being admitted for 24-hour observation.  Ardis Hughs, M.D.

## 2014-12-24 NOTE — Anesthesia Preprocedure Evaluation (Addendum)
Anesthesia Evaluation  Patient identified by MRN, date of birth, ID band Patient awake  General Assessment Comment:Chronic constipation   . GERD (gastroesophageal reflux disease)  . Arthritis  . Hyperlipidemia  . Orthostatic hypotension 11/19/2013 . Coronary artery disease    a. 12/2013 - STEMI s/p CABGx4. . Mild dementia  . Type 2 diabetes mellitus  . Hypothyroidism  . History of esophageal dilatation  . History of gastritis  . History of hiatal hernia  . History of bladder cancer    hx TCC low grade . Prostate cancer    low-grade . Kidney tumor    right . BPH (benign prostatic hyperplasia)  . CKD (chronic kidney disease) stage 3, GFR 30-59 ml/min    a. h/o nephrectomy. [name] History of kidney stones  . ED (erectile dysfunction)  . S/P CABG x 4    12-18-2013 . Bladder tumor  . Complication of anesthesia    HARD TO WAKE . OSA on CPAP    mild osa per sleep study 07-11-2009- patient states does not use regularly . DVT (deep venous thrombosis)    a. s/p IVC filter 09/2014. Xarelto stopped at that time due to hematuria and hemoccult + stools. . Myocardial infarction 12/2013 . Stroke    TIA . Chronic combined systolic and diastolic CHF (congestive heart failure)    systolic and diastolic heart failure . Headache  . Depression  . Hematuria  . Heme positive stool  . Anemia   Reviewed: Allergy & Precautions, NPO status , Patient's Chart, lab work & pertinent test results  History of Anesthesia Complications Negative for: history of anesthetic complications  Airway Mallampati: II  TM Distance: >3 FB Neck ROM: Full    Dental  (+) Dental Advisory Given, Edentulous Upper, Edentulous Lower   Pulmonary shortness of breath and with exertion, sleep apnea and Continuous Positive Airway Pressure Ventilation ,  former smoker,  Mild OSA   Pulmonary exam normal        Cardiovascular + CAD, + Past MI, + CABG, + Peripheral Vascular Disease and +CHF  Normal cardiovascular exam  TTE 08/2014  Left ventricle: The cavity size was mildly dilated. Wall thickness was normal. Systolic function was mildly to moderately reduced. The estimated ejection fraction was in the range of 40% to 45%. Findings consistent with left ventricular diastolic dysfunction. Doppler parameters are consistent with high ventricular filling pressure. - Regional wall motion abnormality: Mild hypokinesis of the basal-mid inferior and basal-mid inferolateral myocardium. - Ventricular septum: Septal motion showed abnormal function and dyssynergy. These changes are consistent with intraventricular conduction delay. - Aortic valve: Moderately calcified annulus. - Mitral valve: There was moderate eccentric regurgitation. - Right ventricle: The cavity size was mildly dilated. Systolic function was moderately to severely reduced. - Tricuspid valve: There was mild-moderate regurgitation. - Pulmonic valve: Moderately elevated pulmonary pressures (48 mmHg). - Systemic veins: Dilated IVC, with normal respiratory variation. Estimated CVP 8 mmHg.    Neuro/Psych PSYCHIATRIC DISORDERS Anxiety Depression TIACVA    GI/Hepatic Neg liver ROS, hiatal hernia, GERD  Medicated and Controlled,  Endo/Other  diabetes, Well Controlled, Type 2Hypothyroidism Diet controlled DM  Renal/GU Renal InsufficiencyRenal diseasenegative Renal ROSCRT 1.95  negative genitourinary   Musculoskeletal negative musculoskeletal ROS (+)   Abdominal Normal abdominal exam  (+)   Peds negative pediatric ROS (+)  Hematology negative hematology ROS (+) anemia , hgb 10.4   Anesthesia Other Findings   Reproductive/Obstetrics negative OB ROS  Anesthesia Physical Anesthesia Plan  ASA:  IV  Anesthesia Plan: Spinal   Post-op Pain Management:    Induction:   Airway Management Planned:   Additional Equipment:   Intra-op Plan:   Post-operative Plan:   Informed Consent:   Plan Discussed with: Surgeon  Anesthesia Plan Comments:        Anesthesia Quick Evaluation                                  Anesthesia Evaluation  Patient identified by MRN, date of birth, ID band Patient awake    Reviewed: Allergy & Precautions, H&P , NPO status , Patient's Chart, lab work & pertinent test results  History of Anesthesia Complications Negative for: history of anesthetic complications  Airway Mallampati: II  TM Distance: >3 FB Neck ROM: Full    Dental  (+) Teeth Intact, Dental Advisory Given,    Pulmonary sleep apnea and Continuous Positive Airway Pressure Ventilation , former smoker,    Pulmonary exam normal       Cardiovascular + CAD and + Past MI  EF: Roughly 45-50 % Wall Motion: Mild to moderate basal to mid inferior hypokinesis.    Neuro/Psych  Headaches, PSYCHIATRIC DISORDERS Dementia TIA   GI/Hepatic Neg liver ROS, hiatal hernia, GERD-  Medicated,  Endo/Other  diabetes, Type 2Hypothyroidism Hyperthyroidism   Renal/GU Renal disease     Musculoskeletal  (+) Arthritis -,   Abdominal   Peds  Hematology  (+) anemia ,   Anesthesia Other Findings   Reproductive/Obstetrics negative OB ROS                           Anesthesia Physical  Anesthesia Plan  ASA: III  Anesthesia Plan: General   Post-op Pain Management:    Induction: Intravenous  Airway Management Planned: LMA and Oral ETT  Additional Equipment:   Intra-op Plan:   Post-operative Plan: Extubation in OR  Informed Consent: I have reviewed the patients History and Physical, chart, labs and discussed the procedure including the risks, benefits and alternatives for the proposed anesthesia with the patient or authorized representative who  has indicated his/her understanding and acceptance.   Dental advisory given  Plan Discussed with: CRNA and Anesthesiologist  Anesthesia Plan Comments:        Anesthesia Quick Evaluation

## 2014-12-24 NOTE — H&P (Signed)
Reason For Visit TCC surveillance   History of Present Illness 89M who is s/p right nephro-ureterectomy for 4cm HG Ta right renal pelvis tumor. He was also found to have HG NMIBC in bladder during his work-up.   RAL - Neprho-U on 06/04/14. Discharged 3 days later. Readmitted 5 days later for acute mental status changes. Encephalopathic for ~10 days and then significantly improved. No good explanations for his mental status changes. No surgical complication. Discharged to SNF. Foley was removed in the hospital after a negative cystogram.    Cysto:  09/03/14 - numerous small papillary tumors studding his bladder.    He had a right lower extremity fracture after falling recently. He then developed a DVT and possibly a pulmonary embolus. He has since been placed on Zaroxolyn. He also has had significant issue with his congestive heart failure. He has had shortness of breath and ascites and there has been some issue with managing his fluid retention and his tentative renal function.     Intv: Had IVC filter placed but unable to get to OR because of cardiac instability with edema/worsening CHF. I was hoping to take care of the bladder tumors to prevent them from becoming a bigger problem with hematuria/etc. He presents today for follow-up. Since the patient was last seen he has developed worsening urgency. The patient has the feeling to void 6-8 times per night. He voids well amount each time. He has not had significant gross hematuria, he has been off of his Xarelto. He has had difficulty sleeping at night. His mental status is really declined. Edema/CHF has been stable.   Past Medical History Problems  1. History of Anxiety (F41.9) 2. History of cardiac arrhythmia (Z86.79) 3. History of cardiac disorder (Z86.79) 4. History of depression (Z86.59) 5. History of DVT (deep vein thrombosis) (Z86.718) 6. History of heartburn (Z87.898) 7. History of hypothyroidism (Z86.39) 8. History of myocardial  infarction (I25.2) 9. History of sleep apnea (Z87.09) 10. History of stroke (M35.36)  Surgical History Problems  1. History of Coronary Artery Single Venous Bypass Graft 2. History of Cystoscopy With Biopsy 3. History of Cystoscopy With Fulguration Medium Lesion (2-5cm) 4. History of Cystoscopy With Insertion Of Ureteral Stent Right 5. History of Cystoscopy With Resection Of Tumor 6. History of Cystoscopy With Ureteroscopy For Biopsy Right 7. History of Kidney Surgery Laparoscopically Assisted Nephroureterectomy 8. History of Transurethral Resection Of Prostate (TURP)  Current Meds 1. Aricept 10 MG Oral Tablet;  Therapy: (Recorded:23Nov2015) to Recorded 2. Aspirin 325 MG Oral Tablet;  Therapy: (Recorded:23Nov2015) to Recorded 3. Geodon 20 MG Oral Capsule;  Therapy: (Recorded:24May2016) to Recorded 4. Lexapro 20 MG Oral Tablet;  Therapy: (Recorded:23Nov2015) to Recorded 5. Midodrine HCl - 10 MG Oral Tablet;  Therapy: (Recorded:23Nov2015) to Recorded 6. MiraLax PACK;  Therapy: (Recorded:23Nov2015) to Recorded 7. Protonix 40 MG Oral Tablet Delayed Release;  Therapy: (Recorded:23Nov2015) to Recorded 8. Synthroid 125 MCG Oral Tablet;  Therapy: (Recorded:23Nov2015) to Recorded 9. Torsemide 20 MG Oral Tablet;  Therapy: (Recorded:22Aug2016) to Recorded 10. VESIcare 5 MG Oral Tablet; a tablet daily for 30 days;   Therapy: [id number] to (Last Rx:29Jan2016)  Requested for: 29Jan2016 Ordered  Allergies Medication  1. No Known Drug Allergies  Family History Problems  1. Family history of cardiac disorder (Z82.49) : Mother 2. Family history of kidney stones (Z84.1) : Father 3. Family history of prostate cancer (Z80.42) : Father  Social History Problems  1. Denied: History of Alcohol use 2. Caffeine use (F15.90)   1 3. Mother  alive and healthy   89 4. Never a smoker 5. Number of children   3 sons 73. Retired 75. Single  Review of Systems No fevers or chills. He has had  some back pain. No night sweats. Weight has been stable.   Vitals Vital Signs [Data Includes: Last 1 Day]  Recorded: 22Aug2016 10:48AM  Blood Pressure: 127 / 86 Temperature: 97.6 F Heart Rate: 73  Physical Exam Patient is interactive although disoriented.  No acute distress   Abdomen soft, nontender, nondistended.   Results/Data Urine [Data Includes: Last 1 Day]   22Aug2016  COLOR AMBER   APPEARANCE CLEAR   SPECIFIC GRAVITY 1.020   pH 6.0   GLUCOSE NEGATIVE   BILIRUBIN NEGATIVE   KETONE NEGATIVE   BLOOD 2+   PROTEIN TRACE   NITRITE NEGATIVE   LEUKOCYTE ESTERASE TRACE   SQUAMOUS EPITHELIAL/HPF 0-5 HPF  WBC 6-10 WBC/HPF  RBC 3-10 RBC/HPF  BACTERIA NONE SEEN HPF  CRYSTALS NONE SEEN HPF  CASTS NONE SEEN LPF  Yeast NONE SEEN HPF   Procedure  Procedure: Cystoscopy   Indication: History of Urothelial Carcinoma.  Informed Consent: from the healthcare power of attorney . Specific risks including, but not limited to bleeding, infection, pain, allergic reaction etc. were explained.  Prep: The patient was prepped with betadine.  Anesthesia:. Local anesthesia was administered intraurethrally with 2% lidocaine jelly.  Antibiotic prophylaxis: Ciprofloxacin.  Procedure Note:  Urethral meatus:. No abnormalities.  Anterior urethra: No abnormalities.  Prostatic urethra: No abnormalities.  Bladder: Visulization was clear. Multiple tumors were identified in the bladder. (Patient has numerous small papillary lesions studding his bladder) these tumors are predominantly on the posterior wall and bladder dome. There are only slightly larger than 3 months prior. The patient tolerated the procedure well.  Complications: None.    Plan Transitional cell carcinoma of renal pelvis, unspecified laterality  1. Follow-up Month x 3 Office  Follow-up  Status: Hold For - Date of Service  Requested for:  22Nov2016  Discussion/Summary The patient has numerous small papillary tumors in his  bladder, they have progressed slightly over the last 3 months. Fortunately, he has not had a significant amount of gross hematuria. However, he has had progressive urgency. We discussed the patient's clinical situation with his daughter-in-law extensively. I cautioned them that his urgency will progress, the bladder tumors will get bigger. We have started the patient under better 50 mg daily see if we can improve some of his urgency. We also discussed taking the patient to the operating room as previously discussed for a TURBT. Our plan currently is to see how he does under better 50 mg daily. If his symptoms don't improve and he continues to have severe nocturia limiting his sleep and causing progressive dementia we may proceed to the operating room as a last ditch effort to improve his clinical circumstances. Unfortunately, the patient is in a very difficult situation.

## 2014-12-25 DIAGNOSIS — Z905 Acquired absence of kidney: Secondary | ICD-10-CM | POA: Diagnosis not present

## 2014-12-25 DIAGNOSIS — M1990 Unspecified osteoarthritis, unspecified site: Secondary | ICD-10-CM | POA: Diagnosis not present

## 2014-12-25 DIAGNOSIS — K219 Gastro-esophageal reflux disease without esophagitis: Secondary | ICD-10-CM | POA: Diagnosis not present

## 2014-12-25 DIAGNOSIS — E1151 Type 2 diabetes mellitus with diabetic peripheral angiopathy without gangrene: Secondary | ICD-10-CM | POA: Diagnosis not present

## 2014-12-25 DIAGNOSIS — G4733 Obstructive sleep apnea (adult) (pediatric): Secondary | ICD-10-CM | POA: Diagnosis not present

## 2014-12-25 DIAGNOSIS — Z79899 Other long term (current) drug therapy: Secondary | ICD-10-CM | POA: Diagnosis not present

## 2014-12-25 DIAGNOSIS — E1122 Type 2 diabetes mellitus with diabetic chronic kidney disease: Secondary | ICD-10-CM | POA: Diagnosis not present

## 2014-12-25 DIAGNOSIS — F329 Major depressive disorder, single episode, unspecified: Secondary | ICD-10-CM | POA: Diagnosis not present

## 2014-12-25 DIAGNOSIS — K449 Diaphragmatic hernia without obstruction or gangrene: Secondary | ICD-10-CM | POA: Diagnosis not present

## 2014-12-25 DIAGNOSIS — I5042 Chronic combined systolic (congestive) and diastolic (congestive) heart failure: Secondary | ICD-10-CM | POA: Diagnosis not present

## 2014-12-25 DIAGNOSIS — Z7982 Long term (current) use of aspirin: Secondary | ICD-10-CM | POA: Diagnosis not present

## 2014-12-25 DIAGNOSIS — C679 Malignant neoplasm of bladder, unspecified: Secondary | ICD-10-CM | POA: Diagnosis not present

## 2014-12-25 DIAGNOSIS — F0390 Unspecified dementia without behavioral disturbance: Secondary | ICD-10-CM | POA: Diagnosis not present

## 2014-12-25 DIAGNOSIS — Z951 Presence of aortocoronary bypass graft: Secondary | ICD-10-CM | POA: Diagnosis not present

## 2014-12-25 DIAGNOSIS — Z86718 Personal history of other venous thrombosis and embolism: Secondary | ICD-10-CM | POA: Diagnosis not present

## 2014-12-25 DIAGNOSIS — D649 Anemia, unspecified: Secondary | ICD-10-CM | POA: Diagnosis not present

## 2014-12-25 DIAGNOSIS — E039 Hypothyroidism, unspecified: Secondary | ICD-10-CM | POA: Diagnosis not present

## 2014-12-25 DIAGNOSIS — I252 Old myocardial infarction: Secondary | ICD-10-CM | POA: Diagnosis not present

## 2014-12-25 DIAGNOSIS — Z23 Encounter for immunization: Secondary | ICD-10-CM | POA: Diagnosis not present

## 2014-12-25 DIAGNOSIS — E785 Hyperlipidemia, unspecified: Secondary | ICD-10-CM | POA: Diagnosis not present

## 2014-12-25 DIAGNOSIS — I2510 Atherosclerotic heart disease of native coronary artery without angina pectoris: Secondary | ICD-10-CM | POA: Diagnosis not present

## 2014-12-25 DIAGNOSIS — N183 Chronic kidney disease, stage 3 (moderate): Secondary | ICD-10-CM | POA: Diagnosis not present

## 2014-12-25 DIAGNOSIS — Z8553 Personal history of malignant neoplasm of renal pelvis: Secondary | ICD-10-CM | POA: Diagnosis not present

## 2014-12-25 DIAGNOSIS — Z8673 Personal history of transient ischemic attack (TIA), and cerebral infarction without residual deficits: Secondary | ICD-10-CM | POA: Diagnosis not present

## 2014-12-25 MED ORDER — BELLADONNA ALKALOIDS-OPIUM 16.2-60 MG RE SUPP: 1.0000 | Freq: Three times a day (TID) | RECTAL | Status: DC | PRN

## 2014-12-25 MED ORDER — MIRABEGRON ER 50 MG PO TB24: 50.0000 mg | ORAL_TABLET | Freq: Every morning | ORAL | Status: DC

## 2014-12-25 NOTE — Discharge Summary (Signed)
Date of admission: 12/24/2014  Date of discharge: 12/25/2014  Admission diagnosis: Bladder cancer - overlapping sites  Discharge diagnosis: same, dementia, CHF  Secondary diagnoses:  Patient Active Problem List   Diagnosis Date Noted  . Bladder cancer 12/24/2014  . AKI (acute kidney injury) c Cardiorenal syndrome 10/02/2014  . Postprocedural Bleeding 09/18/2014  . DVT (deep venous thrombosis)   . Hematuria 09/02/2014  . Pulmonary embolism suspected 08/20/2014  . Congestive heart disease   . Lower leg DVT (deep venous thrombosis)   . Protein-calorie malnutrition, severe 08/18/2014  . CHF (congestive heart failure) 08/17/2014  . Acute renal failure superimposed on stage 3 chronic kidney disease 08/17/2014  . Acute on chronic systolic and diastolic heart failure, NYHA class 3 08/17/2014  . CKD (chronic kidney disease) stage 3, GFR 30-59 ml/min   . Palliative care encounter   . Obstructive uropathy   . Urinary tract infectious disease   . Blindness   . NSTEMI (non-ST elevated myocardial infarction)   . Renal insufficiency   . S/p nephrectomy 06/12/2014  . Acute renal failure 06/12/2014  . UTI (lower urinary tract infection) 06/12/2014  . Metabolic encephalopathy 88/82/8003  . Elevated troponin 06/12/2014  . Altered mental state 06/12/2014  . Altered mental status   . Transitional cell carcinoma of kidney 06/05/2014  . TIA (transient ischemic attack) 01/23/2014  . Dementia without behavioral disturbance 01/23/2014  . Prolonged Q-T interval on ECG 01/23/2014  . DM type 2 (diabetes mellitus, type 2) 01/23/2014  . Anemia, post op 01/13/2014  . CAD, multiple vessel 12/18/2013  . S/P CABG x 4 12/18/2013  . ST elevation myocardial infarction (STEMI) of inferior wall, initial episode of care 12/17/2013  . Hypoxemia 11/19/2013  . Orthostatic hypotension 11/19/2013  . Dysphagia, unspecified(787.20) 12/12/2012  . Dizziness 11/07/2012  . Hyperlipidemia 11/07/2012  . Obstructive sleep  apnea 11/07/2012    History and Physical: For full details, please see admission history and physical. Briefly, [name] [name] is a 81 y.o. year old patient with recurrent high grade bladder cancer.   Hospital Course: Patient tolerated the procedure well.  He was then transferred to the floor after an uneventful PACU stay.  His hospital course was uncomplicated.  On POD#1  he had met discharge criteria: was eating a regular diet, was up and ambulating independently,  pain was well controlled,and was ready to for discharge.  PE on day of discharge: NAD Filed Vitals:   12/24/14 1515 12/24/14 1529 12/24/14 2147 12/25/14 0500  BP:  138/89 131/88 127/74  Pulse: 74 75 77 72  Temp:  97.5 F (36.4 C) 98.8 F (37.1 C) 98.4 F (36.9 C)  TempSrc:  Oral Oral Oral  Resp: 18 16 16    Height:  5' 10"  (1.778 m)    Weight:  108.863 kg (240 lb)    SpO2: 100% 100% 100% 93%    Intake/Output Summary (Last 24 hours) at 12/25/14 0734 Last data filed at 12/25/14 0524  Gross per 24 hour  Intake    770 ml  Output   2350 ml  Net  -1580 ml    Alert Abdomen is soft Foley draining straw colored urine  Laboratory values:  No results for input(s): WBC, HGB, HCT in the last 72 hours.  Recent Labs  12/24/14 1558  NA 141  K 4.5  CL 103  CO2 31  GLUCOSE 105*  BUN 35*  CREATININE 2.23*  CALCIUM 8.7*   No results for input(s): LABPT, INR in the last 72  hours. No results for input(s): LABURIN in the last 72 hours. Results for orders placed or performed during the hospital encounter of 12/21/14  Urine culture     Status: None   Collection Time: 12/21/14 11:00 AM  Result Value Ref Range Status   Specimen Description URINE, RANDOM  Final   Special Requests NONE  Final   Culture   Final    MULTIPLE SPECIES PRESENT, SUGGEST RECOLLECTION Performed at [facility]    Report Status 12/23/2014 FINAL  Final    Disposition: Home  Discharge instruction: The patient was instructed to be  ambulatory but told to refrain from heavy lifting, strenuous activity, or driving.   Discharge medications:   Medication List    TAKE these medications        aspirin 81 MG chewable tablet  Chew 1 tablet (81 mg total) by mouth daily.     donepezil 10 MG tablet  Commonly known as:  ARICEPT  Take 10 mg by mouth at bedtime.     escitalopram 20 MG tablet  Commonly known as:  LEXAPRO  Take 20 mg by mouth every morning.     fludrocortisone 0.1 MG tablet  Commonly known as:  FLORINEF  Take 0.2 mg by mouth every morning.     levothyroxine 125 MCG tablet  Commonly known as:  SYNTHROID, LEVOTHROID  Take 125 mcg by mouth daily before breakfast.     midodrine 10 MG tablet  Commonly known as:  PROAMATINE  Take 10 mg by mouth 2 (two) times daily.     mirabegron ER 50 MG Tb24 tablet  Commonly known as:  MYRBETRIQ  Take 1 tablet (50 mg total) by mouth every morning.     MUCINEX FAST-MAX COLD & SINUS PO  Take 30 mLs by mouth daily as needed (allergies.).     opium-belladonna 16.2-60 MG suppository  Commonly known as:  B&O SUPPRETTES  Place 1 suppository rectally every 8 (eight) hours as needed for bladder spasms.     pantoprazole 40 MG tablet  Commonly known as:  PROTONIX  TAKE ONE TABLET EVERY MORNING     polyethylene glycol packet  Commonly known as:  MIRALAX / GLYCOLAX  Take 17 g by mouth at bedtime.     potassium chloride SA 20 MEQ tablet  Commonly known as:  K-DUR,KLOR-CON  Take 20 mEq by mouth every morning.     torsemide 20 MG tablet  Commonly known as:  DEMADEX  Take 20-40 mg daily as needed for leg swelling     ziprasidone 20 MG capsule  Commonly known as:  GEODON  Take 20 mg by mouth every morning.        Followup:      Follow-up Information    Follow up with Ardis Hughs, MD In 1 week.   Specialty:  Urology   Contact information:   [address] [id number] [phone]

## 2014-12-25 NOTE — Progress Notes (Signed)
Patient discharge home with follow up with Dr. Louis Meckel of urology in one week. Leg bag placed, explanation and demonstration on use given to caretaker. Pt taken to lobby via wheel chair.

## 2014-12-25 NOTE — Discharge Instructions (Signed)
Transurethral Resection of Bladder Tumor (TURBT) or Bladder Biopsy   Definition:  Transurethral Resection of the Bladder Tumor is a surgical procedure used to diagnose and remove tumors within the bladder. TURBT is the most common treatment for early stage bladder cancer.  General instructions:     Your recent bladder surgery requires very little post hospital care but some definite precautions.  Despite the fact that no skin incisions were used, the area around the bladder incisions are raw and covered with scabs to promote healing and prevent bleeding. Certain precautions are needed to insure that the scabs are not disturbed over the next 2-4 weeks while the healing proceeds.  Because the raw surface inside your bladder and the irritating effects of urine you may expect frequency of urination and/or urgency (a stronger desire to urinate) and perhaps even getting up at night more often. This will usually resolve or improve slowly over the healing period. You may see some blood in your urine over the first 6 weeks. Do not be alarmed, even if the urine was clear for a while. Get off your feet and drink lots of fluids until clearing occurs. If you start to pass clots or don't improve call us.  Diet:  You may return to your normal diet immediately. Because of the raw surface of your bladder, alcohol, spicy foods, foods high in acid and drinks with caffeine may cause irritation or frequency and should be used in moderation. To keep your urine flowing freely and avoid constipation, drink plenty of fluids during the day (8-10 glasses). Tip: Avoid cranberry juice because it is very acidic.  Activity:  Your physical activity doesn't need to be restricted. However, if you are very active, you may see some blood in the urine. We suggest that you reduce your activity under the circumstances until the bleeding has stopped.  Bowels:  It is important to keep your bowels regular during the postoperative  period. Straining with bowel movements can cause bleeding. A bowel movement every other day is reasonable. Use a mild laxative if needed, such as milk of magnesia 2-3 tablespoons, or 2 Dulcolax tablets. Call if you continue to have problems. If you had been taking narcotics for pain, before, during or after your surgery, you may be constipated. Take a laxative if necessary.    Medication:  You should resume your pre-surgery medications unless told not to. In addition you may be given an antibiotic to prevent or treat infection. Antibiotics are not always necessary. All medication should be taken as prescribed until the bottles are finished unless you are having an unusual reaction to one of the drugs.     Foley Catheter Care A soft, flexible tube (Foley catheter) may have been placed in your bladder to drain urine and fluid. Follow these instructions: Taking Care of the Catheter  Keep the area where the catheter leaves your body clean.   Attach the catheter to the leg so there is no tension on the catheter.   Keep the drainage bag below the level of the bladder, but keep it OFF the floor.   Do not take long soaking baths. Your caregiver will give instructions about showering.   Wash your hands before touching ANYTHING related to the catheter or bag.   Using mild soap and warm water on a washcloth:   Clean the area closest to the catheter insertion site using a circular motion around the catheter.   Clean the catheter itself by wiping AWAY from the insertion   site for several inches down the tube.   NEVER wipe upward as this could sweep bacteria up into the urethra (tube in your body that normally drains the bladder) and cause infection.   Place a small amount of sterile lubricant at the tip of the penis where the catheter is entering.  Taking Care of the Drainage Bags  Two drainage bags may be taken home: a large overnight drainage bag, and a smaller leg bag which fits underneath  clothing.   It is okay to wear the overnight bag at any time, but NEVER wear the smaller leg bag at night.   Keep the drainage bag well below the level of your bladder. This prevents backflow of urine into the bladder and allows the urine to drain freely.   Anchor the tubing to your leg to prevent pulling or tension on the catheter. Use tape or a leg strap provided by the hospital.   Empty the drainage bag when it is 1/2 to 3/4 full. Wash your hands before and after touching the bag.   Periodically check the tubing for kinks to make sure there is no pressure on the tubing which could restrict the flow of urine.  Changing the Drainage Bags  Cleanse both ends of the clean bag with alcohol before changing.   Pinch off the rubber catheter to avoid urine spillage during the disconnection.   Disconnect the dirty bag and connect the clean one.   Empty the dirty bag carefully to avoid a urine spill.   Attach the new bag to the leg with tape or a leg strap.  Cleaning the Drainage Bags  Whenever a drainage bag is disconnected, it must be cleaned quickly so it is ready for the next use.   Wash the bag in warm, soapy water.   Rinse the bag thoroughly with warm water.   Soak the bag for 30 minutes in a solution of white vinegar and water (1 cup vinegar to 1 quart warm water).   Rinse with warm water.  SEEK MEDICAL CARE IF:   You have chills or night sweats.   You are leaking around your catheter or have problems with your catheter. It is not uncommon to have sporadic leakage around your catheter as a result of bladder spasms. If the leakage stops, there is not much need for concern. If you are uncertain, call your caregiver.   You develop side effects that you think are coming from your medicines.  SEEK IMMEDIATE MEDICAL CARE IF:   You are suddenly unable to urinate. Check to see if there are any kinks in the drainage tubing that may cause this. If you cannot find any kinks, call your  caregiver immediately. This is an emergency.   You develop shortness of breath or chest pains.   Bleeding persists or clots develop in your urine.   You have a fever.   You develop pain in your back or over your lower belly (abdomen).   You develop pain or swelling in your legs.   Any problems you are having get worse rather than better.  MAKE SURE YOU:   Understand these instructions.   Will watch your condition.   Will get help right away if you are not doing well or get worse.   

## 2014-12-25 NOTE — Care Management Note (Signed)
Case Management Note  Patient Details  Name: CHERON PASQUARELLI MRN: [id number] Date of Birth: 12/23/1933  Subjective/Objective: 81 y/o m admitted w/Bladder Ca. S/p TURBT.From home.                   Action/Plan:d/c home no needs or orders.   Expected Discharge Date:                  Expected Discharge Plan:  Home/Self Care  In-House Referral:     Discharge planning Services  CM Consult  Post Acute Care Choice:    Choice offered to:     DME Arranged:    DME Agency:     HH Arranged:    [place] Agency:     Status of Service:  Completed, signed off  Medicare Important Message Given:    Date Medicare IM Given:    Medicare IM give by:    Date Additional Medicare IM Given:    Additional Medicare Important Message give by:     If discussed at [place] of Stay Meetings, dates discussed:    Additional Comments:  Dessa Phi, RN 12/25/2014, 9:44 AM

## 2015-01-01 DIAGNOSIS — C672 Malignant neoplasm of lateral wall of bladder: Secondary | ICD-10-CM | POA: Diagnosis not present

## 2015-01-04 ENCOUNTER — Encounter (HOSPITAL_COMMUNITY): Payer: Self-pay | Admitting: *Deleted

## 2015-01-04 ENCOUNTER — Inpatient Hospital Stay (HOSPITAL_COMMUNITY)
Admission: EM | Admit: 2015-01-04 | Discharge: 2015-01-08 | DRG: 293 | Disposition: A | Discharge status: Home Health | Payer: Medicare Other | Attending: Internal Medicine | Admitting: Internal Medicine

## 2015-01-04 ENCOUNTER — Emergency Department (HOSPITAL_COMMUNITY): Payer: Medicare Other

## 2015-01-04 DIAGNOSIS — R7989 Other specified abnormal findings of blood chemistry: Secondary | ICD-10-CM | POA: Diagnosis present

## 2015-01-04 DIAGNOSIS — R778 Other specified abnormalities of plasma proteins: Secondary | ICD-10-CM | POA: Diagnosis present

## 2015-01-04 DIAGNOSIS — I252 Old myocardial infarction: Secondary | ICD-10-CM | POA: Diagnosis not present

## 2015-01-04 DIAGNOSIS — Z66 Do not resuscitate: Secondary | ICD-10-CM | POA: Diagnosis present

## 2015-01-04 DIAGNOSIS — F0390 Unspecified dementia without behavioral disturbance: Secondary | ICD-10-CM | POA: Diagnosis not present

## 2015-01-04 DIAGNOSIS — C679 Malignant neoplasm of bladder, unspecified: Secondary | ICD-10-CM | POA: Diagnosis not present

## 2015-01-04 DIAGNOSIS — I509 Heart failure, unspecified: Secondary | ICD-10-CM

## 2015-01-04 DIAGNOSIS — R9431 Abnormal electrocardiogram [ECG] [EKG]: Secondary | ICD-10-CM | POA: Diagnosis present

## 2015-01-04 DIAGNOSIS — E119 Type 2 diabetes mellitus without complications: Secondary | ICD-10-CM | POA: Diagnosis not present

## 2015-01-04 DIAGNOSIS — E039 Hypothyroidism, unspecified: Secondary | ICD-10-CM | POA: Diagnosis present

## 2015-01-04 DIAGNOSIS — Z8546 Personal history of malignant neoplasm of prostate: Secondary | ICD-10-CM | POA: Diagnosis not present

## 2015-01-04 DIAGNOSIS — G4733 Obstructive sleep apnea (adult) (pediatric): Secondary | ICD-10-CM | POA: Diagnosis present

## 2015-01-04 DIAGNOSIS — I5043 Acute on chronic combined systolic (congestive) and diastolic (congestive) heart failure: Principal | ICD-10-CM

## 2015-01-04 DIAGNOSIS — R1310 Dysphagia, unspecified: Secondary | ICD-10-CM | POA: Diagnosis not present

## 2015-01-04 DIAGNOSIS — I951 Orthostatic hypotension: Secondary | ICD-10-CM | POA: Diagnosis not present

## 2015-01-04 DIAGNOSIS — E876 Hypokalemia: Secondary | ICD-10-CM | POA: Diagnosis present

## 2015-01-04 DIAGNOSIS — I82409 Acute embolism and thrombosis of unspecified deep veins of unspecified lower extremity: Secondary | ICD-10-CM | POA: Diagnosis present

## 2015-01-04 DIAGNOSIS — Z951 Presence of aortocoronary bypass graft: Secondary | ICD-10-CM

## 2015-01-04 DIAGNOSIS — K219 Gastro-esophageal reflux disease without esophagitis: Secondary | ICD-10-CM | POA: Diagnosis not present

## 2015-01-04 DIAGNOSIS — Z86718 Personal history of other venous thrombosis and embolism: Secondary | ICD-10-CM | POA: Diagnosis not present

## 2015-01-04 DIAGNOSIS — Z8249 Family history of ischemic heart disease and other diseases of the circulatory system: Secondary | ICD-10-CM

## 2015-01-04 DIAGNOSIS — E785 Hyperlipidemia, unspecified: Secondary | ICD-10-CM | POA: Diagnosis not present

## 2015-01-04 DIAGNOSIS — R0602 Shortness of breath: Secondary | ICD-10-CM | POA: Diagnosis not present

## 2015-01-04 DIAGNOSIS — N183 Chronic kidney disease, stage 3 unspecified: Secondary | ICD-10-CM | POA: Diagnosis present

## 2015-01-04 DIAGNOSIS — Z87891 Personal history of nicotine dependence: Secondary | ICD-10-CM | POA: Diagnosis not present

## 2015-01-04 DIAGNOSIS — D649 Anemia, unspecified: Secondary | ICD-10-CM | POA: Diagnosis present

## 2015-01-04 DIAGNOSIS — E1122 Type 2 diabetes mellitus with diabetic chronic kidney disease: Secondary | ICD-10-CM | POA: Diagnosis not present

## 2015-01-04 DIAGNOSIS — J90 Pleural effusion, not elsewhere classified: Secondary | ICD-10-CM | POA: Diagnosis not present

## 2015-01-04 DIAGNOSIS — I5031 Acute diastolic (congestive) heart failure: Secondary | ICD-10-CM | POA: Diagnosis not present

## 2015-01-04 DIAGNOSIS — Z905 Acquired absence of kidney: Secondary | ICD-10-CM | POA: Diagnosis present

## 2015-01-04 DIAGNOSIS — Z7982 Long term (current) use of aspirin: Secondary | ICD-10-CM

## 2015-01-04 DIAGNOSIS — D72829 Elevated white blood cell count, unspecified: Secondary | ICD-10-CM | POA: Diagnosis not present

## 2015-01-04 LAB — CBC WITH DIFFERENTIAL/PLATELET
Basophils Absolute: 0 10*3/uL (ref 0.0–0.1)
Basophils Relative: 0 %
Eosinophils Absolute: 0.2 10*3/uL (ref 0.0–0.7)
Eosinophils Relative: 1 %
HEMATOCRIT: 31.6 % — AB (ref 39.0–52.0)
Hemoglobin: 9.6 g/dL — ABNORMAL LOW (ref 13.0–17.0)
LYMPHS ABS: 1.3 10*3/uL (ref 0.7–4.0)
LYMPHS PCT: 12 %
MCH: 24.1 pg — AB (ref 26.0–34.0)
MCHC: 30.4 g/dL (ref 30.0–36.0)
MCV: 79.4 fL (ref 78.0–100.0)
MONO ABS: 1 10*3/uL (ref 0.1–1.0)
MONOS PCT: 8 %
NEUTROS ABS: 9 10*3/uL — AB (ref 1.7–7.7)
Neutrophils Relative %: 79 %
Platelets: 175 10*3/uL (ref 150–400)
RBC: 3.98 MIL/uL — ABNORMAL LOW (ref 4.22–5.81)
RDW: 19.4 % — AB (ref 11.5–15.5)
WBC: 11.5 10*3/uL — ABNORMAL HIGH (ref 4.0–10.5)

## 2015-01-04 LAB — BASIC METABOLIC PANEL
ANION GAP: 10 (ref 5–15)
BUN: 33 mg/dL — ABNORMAL HIGH (ref 6–20)
CALCIUM: 8.4 mg/dL — AB (ref 8.9–10.3)
CHLORIDE: 100 mmol/L — AB (ref 101–111)
CO2: 33 mmol/L — AB (ref 22–32)
Creatinine, Ser: 2.06 mg/dL — ABNORMAL HIGH (ref 0.61–1.24)
GFR calc Af Amer: 34 mL/min — ABNORMAL LOW (ref >60)
GFR calc non Af Amer: 29 mL/min — ABNORMAL LOW (ref >60)
GLUCOSE: 121 mg/dL — AB (ref 65–99)
Potassium: 3.5 mmol/L (ref 3.5–5.1)
Sodium: 143 mmol/L (ref 135–145)

## 2015-01-04 LAB — BRAIN NATRIURETIC PEPTIDE: B Natriuretic Peptide: 2464 pg/mL — ABNORMAL HIGH (ref 0.0–100.0)

## 2015-01-04 LAB — TROPONIN I: Troponin I: 0.05 ng/mL — ABNORMAL HIGH (ref <0.031)

## 2015-01-04 MED ORDER — IPRATROPIUM-ALBUTEROL 0.5-2.5 (3) MG/3ML IN SOLN
3.0000 mL | Freq: Once | RESPIRATORY_TRACT | Status: AC
Start: 2015-01-04 — End: 2015-01-04
  Administered 2015-01-04: 3 mL via RESPIRATORY_TRACT
  Filled 2015-01-04: qty 3

## 2015-01-04 MED ORDER — FUROSEMIDE 10 MG/ML IJ SOLN
80.0000 mg | Freq: Once | INTRAMUSCULAR | Status: AC
  Administered 2015-01-04: 80 mg via INTRAVENOUS
  Filled 2015-01-04: qty 8

## 2015-01-04 MED ORDER — ALBUTEROL SULFATE (2.5 MG/3ML) 0.083% IN NEBU
2.5000 mg | INHALATION_SOLUTION | Freq: Once | RESPIRATORY_TRACT | Status: AC
  Administered 2015-01-04: 2.5 mg via RESPIRATORY_TRACT
  Filled 2015-01-04: qty 3

## 2015-01-04 NOTE — ED Provider Notes (Signed)
CSN: [id number]     Arrival date & time 01/04/15  2034 History  This chart was scribed for [name] Bo, MD by Julien Nordmann, ED Scribe. This patient was seen in room APA02/APA02 and the patient's care was started at 9:17 PM.    Chief Complaint  Patient presents with  . Shortness of Breath      The history is provided by the patient. No language interpreter was used.   HPI Comments: ARDIAN [name] is a 81 y.o. male who has a hx of multiple heart problems including CHF presents to the Emergency Department complaining of sudden onset, intermittent gradual worsening onset 2 days ago.  Per daughter, pt has not had trouble with shortness of breath in the past until he was hospitalized in November for CHF. She states lives on his own put has multiple sitters throughout the day. Pt had bladder surgery about one week ago. He denies pain and cough.  Past Medical History  Diagnosis Date  . Chronic constipation   . GERD (gastroesophageal reflux disease)   . Hyperlipidemia   . Orthostatic hypotension 11/19/2013  . Coronary artery disease     a. 12/2013 - STEMI s/p CABGx4.  . Mild dementia   . Hypothyroidism   . History of esophageal dilatation   . History of gastritis   . History of hiatal hernia   . History of bladder cancer     hx  TCC low grade  . Kidney tumor     right  . BPH (benign prostatic hyperplasia)   . CKD (chronic kidney disease) stage 3, GFR 30-59 ml/min     a. h/o nephrectomy.  [name] History of kidney stones   . ED (erectile dysfunction)   . S/P CABG x 4     12-18-2013  . Bladder tumor   . DVT (deep venous thrombosis)     a. s/p IVC filter 09/2014. Xarelto stopped at that time due to hematuria and hemoccult + stools.  . Myocardial infarction 12/2013  . Stroke     TIA  . Chronic combined systolic and diastolic CHF (congestive heart failure)     systolic and diastolic heart failure  . Depression   . Hematuria   . Heme positive stool   . Anemia   . Shortness of breath  dyspnea     non change in status per dtr-in-low depends on weather and activity   . Type 2 diabetes mellitus     borderline on no medications   . Anxiety   . Prostate cancer     low-grade and bladder cancer and kidney cancer   . OSA on CPAP     mild osa   per sleep study 07-11-2009- patient states does not use regularly   Past Surgical History  Procedure Laterality Date  . Upper gastrointestinal endoscopy  04/06/2010  &  01-08-2013    w/ esophageal dilatation  . Hemorrhoid surgery    . Hand surgery      right  . Wrist fracture surgery      pins placed-left  . Cystoscopy with biopsy  01/09/2012    Procedure: CYSTOSCOPY WITH BIOPSY;  Surgeon: Marissa Nestle, MD;  Location: [facility];  Service: Urology;  Laterality: N/A;  Cystoscopy with Multiple Bladder Biopsies  . Esophagogastroduodenoscopy (egd) with esophageal dilation N/A 01/08/2013    Procedure: ESOPHAGOGASTRODUODENOSCOPY (EGD) WITH ESOPHAGEAL DILATION;  Surgeon: Rogene Houston, MD;  Location: [facility] SU[facility];  Service: Endoscopy;  Laterality: N/A;  120  .  Left heart cath N/A 12/17/2013    Procedure: LEFT HEART CATH;  Surgeon: Leonie Man, MD;  [facility] CATH LAB;  Service: Cardiovascular;  Laterality: N/A;  . Left heart catheterization with coronary angiogram N/A 12/17/2013    Procedure: LEFT HEART CATHETERIZATION WITH CORONARY ANGIOGRAM;  Surgeon: Leonie Man, MD;  Location: [facility] CATH LAB;  Service: Cardiovascular;  Laterality: N/A;  . Colonoscopy with esophagogastroduodenoscopy (egd)  08-15-2006    w/ esophageal dilatation  . Umbilical hernia repair  04-24-2003  . Transurethral resection of bladder tumor  06-14-2004  . Transurethral resection of prostate  10-04-2004  . Cysto/  removal bladder calculus/  resection bladder tumor  10-16-2006  . Cystostomy w/ bladder biopsy  01-09-2012  . Cardiac catheterization  12-17-2013   dr Shanon Brow harding    severe disease RCA with thrombotic appearing subtotal occlusions/  95-99%  OM2/   diffuse disease LAD  and D1/  mild to moderate basal to mid inferior hypokinesis/  severe elevated LVEDP/  ef 45-50%  . Coronary artery bypass graft N/A 12/18/2013    Procedure: Coronary artery bypass grafting times four using left internal mammary artery and endoscopically harvested right saphenous vein graft;  Surgeon: Gaye Pollack, MD;  Location: [facility] OR;  Service: Open Heart Surgery;  Laterality: N/A;  . Transthoracic echocardiogram  11-14-2012    grade I diastolic dysfunction/  ef 55-60%/  trivial MR  and TR  . Cardiovascular stress test  08-09-2009  dr berry    mild to moderate perfusion defect in basal , mid, and apical inferior regions consistent with an infart/scar/   No evidence ischemia/  ef 51%/  no ECG changes/   low risk scan  . Transurethral resection of bladder tumor N/A 04/07/2014    Procedure: TRANSURETHRAL RESECTION OF BLADDER TUMOR (TURBT);  Surgeon: Arvil Persons, MD;  Location[facility];  Service: Urology;  Laterality: N/A;  . Cystoscopy/retrograde/ureteroscopy Right 04/07/2014    Procedure: CYSTOSCOPY/RETROGRADE/URETEROSCOPY WITH BIOPSY OF RENAL PELVIS TUMOR;  Surgeon: Arvil Persons, MD;  Location: [facility];  Service: Urology;  Laterality: Right;  . Robot assisted laparoscopic nephrectomy Right 06/05/2014    Procedure: ROBOTIC ASSISTED LAPAROSCOPIC RIGHT NEPHROURETERECTOMY, VENTRAL HERNIA REPAIR ;  Surgeon: Ardis Hughs, MD;  Location: [facility] ORS;  Service: Urology;  Laterality: Right;  . Cystoscopy w/ ureteral stent placement Right 06/05/2014    Procedure: CYSTOSCOPY ;  Surgeon: Ardis Hughs, MD;  Location: [facility] ORS;  Service: Urology;  Laterality: Right;  . Radiology with anesthesia N/A 06/22/2014    Procedure: MRI BRAIN W/O CONTRAST;  Surgeon: Medication Radiologist, MD;  Location: [place];  Service: Radiology;  Laterality: N/A;  . Eye surgery      cataract surgery  . Hernia repair  19/4174    umbilical hernia repair at time of nephrectomy   . Right kidney removal     . Ivc filter placement ([facility] hx)  09/17/2014   . Transurethral resection of bladder tumor N/A 12/24/2014    Procedure: TRANSURETHRAL RESECTION OF BLADDER TUMOR (TURBT);  Surgeon: Ardis Hughs, MD;  Location: [facility];  Service: Urology;  Laterality: N/A;   Family History  Problem Relation Age of Onset  . Cancer - Lung Brother   . Heart disease Mother   . Heart attack Mother    Social History  Substance Use Topics  . Smoking status: Former Smoker -- 1.00 packs/day for 4 years    Types: Cigarettes    Start  date: 09/08/1952    Quit date: 09/09/1959  . Smokeless tobacco: Former Systems developer    Types: [place] date: 01/03/1960  . Alcohol Use: No    Review of Systems  Respiratory: Positive for shortness of breath. Negative for cough.   All other systems reviewed and are negative.     Allergies  Lorazepam and Statins  Home Medications   Prior to Admission medications   Medication Sig Start Date End Date Taking? Authorizing Provider  aspirin 81 MG chewable tablet Chew 1 tablet (81 mg total) by mouth daily. 08/20/14  Yes Lezlie Octave Black, NP  donepezil (ARICEPT) 10 MG tablet Take 10 mg by mouth at bedtime.   Yes Historical Provider, MD  escitalopram (LEXAPRO) 20 MG tablet Take 20 mg by mouth every morning.    Yes Historical Provider, MD  fludrocortisone (FLORINEF) 0.1 MG tablet Take 0.2 mg by mouth every morning.    Yes Historical Provider, MD  levothyroxine (SYNTHROID, LEVOTHROID) 125 MCG tablet Take 125 mcg by mouth daily before breakfast.   Yes Historical Provider, MD  midodrine (PROAMATINE) 10 MG tablet Take 10 mg by mouth 2 (two) times daily.   Yes Historical Provider, MD  mirabegron ER (MYRBETRIQ) 50 MG TB24 tablet Take 1 tablet (50 mg total) by mouth every morning. 12/25/14  Yes Ardis Hughs, MD  pantoprazole (PROTONIX) 40 MG tablet TAKE ONE TABLET EVERY MORNING 04/06/14  Yes Butch Penny, NP  polyethylene glycol (MIRALAX / GLYCOLAX) packet Take  17 g by mouth at bedtime.   Yes Historical Provider, MD  potassium chloride SA (K-DUR,KLOR-CON) 20 MEQ tablet Take 20 mEq by mouth every morning.    Yes Historical Provider, MD  torsemide (DEMADEX) 20 MG tablet Take 20-40 mg daily as needed for leg swelling 10/21/14  Yes Herminio Commons, MD  ziprasidone (GEODON) 20 MG capsule Take 20 mg by mouth every morning.  09/15/14  Yes Historical Provider, MD  opium-belladonna (B&O SUPPRETTES) 16.2-60 MG suppository Place 1 suppository rectally every 8 (eight) hours as needed for bladder spasms. 12/25/14   Ardis Hughs, MD   Triage vitals: BP 118/87 mmHg  Pulse 84  Temp(Src) 97.7 F (36.5 C) (Oral)  Resp 28  Ht 5' 10.5" (1.791 m)  Wt 240 lb (108.863 kg)  BMI 33.94 kg/m2  SpO2 97% Physical Exam  Constitutional: He is oriented to person, place, and time. He appears well-developed and well-nourished.  HENT:  Head: Normocephalic and atraumatic.  Right Ear: External ear normal.  Left Ear: External ear normal.  Eyes: Conjunctivae and EOM are normal. Pupils are equal, round, and reactive to light.  Neck: Normal range of motion and phonation normal. Neck supple.  Cardiovascular: Normal rate, regular rhythm and normal heart sounds.   Pulmonary/Chest: Effort normal. He has wheezes. He has rales. He exhibits no bony tenderness.  No rhonchi, scattered wheezing  Abdominal: Soft. There is no tenderness.  Musculoskeletal: Normal range of motion.  Neurological: He is alert and oriented to person, place, and time. No cranial nerve deficit or sensory deficit. He exhibits normal muscle tone. Coordination normal.  2+ pitting edema  Skin: Skin is warm, dry and intact.  Psychiatric: He has a normal mood and affect. His behavior is normal. Judgment and thought content normal.  Nursing note and vitals reviewed.   ED Course  Procedures   Medications  ipratropium-albuterol (DUONEB) 0.5-2.5 (3) MG/3ML nebulizer solution 3 mL (3 mLs Nebulization Given 01/04/15  2046)  albuterol (PROVENTIL) (2.5 MG/3ML)  0.083% nebulizer solution 2.5 mg (2.5 mg Nebulization Given 01/04/15 2046)  furosemide (LASIX) injection 80 mg (80 mg Intravenous Given 01/04/15 2217)    Patient Vitals for the past 24 hrs:  BP Temp Temp src Pulse Resp SpO2 Height Weight  01/04/15 2300 116/62 mmHg - - 82 20 98 % - -  01/04/15 2230 (!) 79/61 mmHg - - 84 22 95 % - -  01/04/15 2221 - - - - - - - 242 lb 8 oz (109.997 kg)  01/04/15 2130 124/94 mmHg - - - - - - -  01/04/15 2047 - - - - - 97 % - -  01/04/15 2039 118/87 mmHg 97.7 F (36.5 C) Oral 84 (!) 28 95 % 5' 10.5" (1.791 m) 240 lb (108.863 kg)    11:15 PM Reevaluation with update and discussion. After initial assessment and treatment, an updated evaluation reveals transient hypotension following menstruation of Lasix. WENTZ,ELLIOTT L   11:19 PM-Consult complete with Dr. Darrick Meigs. Patient case explained and discussed. He agrees to admit patient for further evaluation and treatment. Call ended at 23:26  CRITICAL CARE Performed by: Richarda Blade Total critical care time: 40 minutes Critical care time was exclusive of separately billable procedures and treating other patients. Critical care was necessary to treat or prevent imminent or life-threatening deterioration. Critical care was time spent personally by me on the following activities: development of treatment plan with patient and/or surrogate as well as nursing, discussions with consultants, evaluation of patient's response to treatment, examination of patient, obtaining history from patient or surrogate, ordering and performing treatments and interventions, ordering and review of laboratory studies, ordering and review of radiographic studies, pulse oximetry and re-evaluation of patient's condition.   DIAGNOSTIC STUDIES: Oxygen Saturation is 97% on RA, normal by my interpretation.  COORDINATION OF CARE:  9:21 PM Discussed treatment plan with pt at bedside and pt agreed to  plan.  Labs Review Labs Reviewed  BASIC METABOLIC PANEL - Abnormal; Notable for the following:    Chloride 100 (*)    CO2 33 (*)    Glucose, Bld 121 (*)    BUN 33 (*)    Creatinine, Ser 2.06 (*)    Calcium 8.4 (*)    GFR calc non Af Amer 29 (*)    GFR calc Af Amer 34 (*)    All other components within normal limits  CBC WITH DIFFERENTIAL/PLATELET - Abnormal; Notable for the following:    WBC 11.5 (*)    RBC 3.98 (*)    Hemoglobin 9.6 (*)    HCT 31.6 (*)    MCH 24.1 (*)    RDW 19.4 (*)    Neutro Abs 9.0 (*)    All other components within normal limits  BRAIN NATRIURETIC PEPTIDE - Abnormal; Notable for the following:    B Natriuretic Peptide 2464.0 (*)    All other components within normal limits  TROPONIN I - Abnormal; Notable for the following:    Troponin I 0.05 (*)    All other components within normal limits    Imaging Review Dg Chest Portable 1 View  01/04/2015   CLINICAL DATA:  Shortness of breath all day.  EXAM: PORTABLE CHEST 1 VIEW  COMPARISON:  09/18/2014  FINDINGS: Patient is post median sternotomy. Lung volumes are low. Hazy opacity at the lung bases, likely pleural effusions. Perivascular haziness concerning for pulmonary edema. No pneumothorax or confluent airspace disease. Cardiomediastinal contours are unchanged allowing for differences in technique.  IMPRESSION: Hypoventilatory chest with bilateral  pleural effusions and perivascular haziness, concerning for pulmonary edema. Findings suggest mild CHF.   Electronically Signed   By: Jeb Levering M.D.   On: 01/04/2015 21:19   I have personally reviewed and evaluated these images and lab results as part of my medical decision-making.   EKG Interpretation   Date/Time:  Monday January 04 2015 23:01:35 EDT Ventricular Rate:  83 PR Interval:  169 QRS Duration: 114 QT Interval:  495 QTC Calculation: 582 R Axis:   94 Text Interpretation:  Sinus rhythm Incomplete right bundle branch block  Low voltage,  precordial leads Abnormal inferior Q waves Nonspecific T  abnormalities, lateral leads Prolonged QT interval Since last tracing QT  has lengthened Confirmed by Eulis Foster  MD, ELLIOTT ([id number]) on 01/04/2015  11:07:32 PM      MDM   Final diagnoses:  Acute diastolic congestive heart failure  Prolonged Q-T interval on ECG   Shortness of breath related to acute congestive heart failure. He has a history of diastolic. He was admitted for a bladder tumor resection, about 10 days ago. This appears to be unrelated to the current process. Minimal elevation of troponin unlikely to indicate an acute coronary event. Significant prolongation of the QT, which will require monitoring.   Nursing Notes Reviewed/ Care Coordinated, and agree without changes. Applicable Imaging Reviewed.  Interpretation of Laboratory Data incorporated into ED treatment  Plan: Admit   I personally performed the services described in this documentation, which was scribed in my presence. The recorded information has been reviewed and is accurate.    [name] Bo, MD 01/04/15 [phone]

## 2015-01-04 NOTE — ED Notes (Signed)
Emptied out patients leg bag of 14ml of urine.

## 2015-01-04 NOTE — ED Notes (Signed)
Sob all day, hx of CHF, pt denies pain

## 2015-01-05 ENCOUNTER — Encounter (HOSPITAL_COMMUNITY): Payer: Self-pay | Admitting: *Deleted

## 2015-01-05 DIAGNOSIS — R7989 Other specified abnormal findings of blood chemistry: Secondary | ICD-10-CM

## 2015-01-05 DIAGNOSIS — I4581 Long QT syndrome: Secondary | ICD-10-CM

## 2015-01-05 DIAGNOSIS — Z951 Presence of aortocoronary bypass graft: Secondary | ICD-10-CM

## 2015-01-05 DIAGNOSIS — E119 Type 2 diabetes mellitus without complications: Secondary | ICD-10-CM

## 2015-01-05 DIAGNOSIS — I509 Heart failure, unspecified: Secondary | ICD-10-CM

## 2015-01-05 DIAGNOSIS — N183 Chronic kidney disease, stage 3 (moderate): Secondary | ICD-10-CM

## 2015-01-05 DIAGNOSIS — F0390 Unspecified dementia without behavioral disturbance: Secondary | ICD-10-CM

## 2015-01-05 DIAGNOSIS — I82401 Acute embolism and thrombosis of unspecified deep veins of right lower extremity: Secondary | ICD-10-CM

## 2015-01-05 DIAGNOSIS — I5031 Acute diastolic (congestive) heart failure: Secondary | ICD-10-CM

## 2015-01-05 DIAGNOSIS — I5043 Acute on chronic combined systolic (congestive) and diastolic (congestive) heart failure: Principal | ICD-10-CM

## 2015-01-05 LAB — CBC
HCT: 31.9 % — ABNORMAL LOW (ref 39.0–52.0)
HEMOGLOBIN: 9.6 g/dL — AB (ref 13.0–17.0)
MCH: 23.9 pg — ABNORMAL LOW (ref 26.0–34.0)
MCHC: 30.1 g/dL (ref 30.0–36.0)
MCV: 79.4 fL (ref 78.0–100.0)
Platelets: 179 10*3/uL (ref 150–400)
RBC: 4.02 MIL/uL — AB (ref 4.22–5.81)
RDW: 19.4 % — ABNORMAL HIGH (ref 11.5–15.5)
WBC: 10 10*3/uL (ref 4.0–10.5)

## 2015-01-05 LAB — COMPREHENSIVE METABOLIC PANEL
ALBUMIN: 3.6 g/dL (ref 3.5–5.0)
ALK PHOS: 69 U/L (ref 38–126)
ALT: 19 U/L (ref 17–63)
ANION GAP: 9 (ref 5–15)
AST: 29 U/L (ref 15–41)
BUN: 32 mg/dL — ABNORMAL HIGH (ref 6–20)
CALCIUM: 8.4 mg/dL — AB (ref 8.9–10.3)
CO2: 36 mmol/L — AB (ref 22–32)
Chloride: 99 mmol/L — ABNORMAL LOW (ref 101–111)
Creatinine, Ser: 1.97 mg/dL — ABNORMAL HIGH (ref 0.61–1.24)
GFR calc Af Amer: 36 mL/min — ABNORMAL LOW (ref >60)
GFR calc non Af Amer: 31 mL/min — ABNORMAL LOW (ref >60)
GLUCOSE: 104 mg/dL — AB (ref 65–99)
Potassium: 2.9 mmol/L — ABNORMAL LOW (ref 3.5–5.1)
SODIUM: 144 mmol/L (ref 135–145)
Total Bilirubin: 1.2 mg/dL (ref 0.3–1.2)
Total Protein: 6.6 g/dL (ref 6.5–8.1)

## 2015-01-05 LAB — TROPONIN I
TROPONIN I: 0.05 ng/mL — AB (ref <0.031)
Troponin I: 0.05 ng/mL — ABNORMAL HIGH (ref <0.031)
Troponin I: 0.05 ng/mL — ABNORMAL HIGH (ref <0.031)

## 2015-01-05 LAB — MRSA PCR SCREENING: MRSA by PCR: NEGATIVE

## 2015-01-05 MED ORDER — FLUDROCORTISONE ACETATE 0.1 MG PO TABS
0.2000 mg | ORAL_TABLET | Freq: Every morning | ORAL | Status: DC
  Administered 2015-01-05: 0.2 mg via ORAL
  Filled 2015-01-05 (×2): qty 2

## 2015-01-05 MED ORDER — POTASSIUM CHLORIDE CRYS ER 20 MEQ PO TBCR
20.0000 meq | EXTENDED_RELEASE_TABLET | Freq: Every morning | ORAL | Status: DC
  Administered 2015-01-05 – 2015-01-08 (×4): 20 meq via ORAL
  Filled 2015-01-05 (×5): qty 1

## 2015-01-05 MED ORDER — ASPIRIN 81 MG PO CHEW
81.0000 mg | CHEWABLE_TABLET | Freq: Every day | ORAL | Status: DC
  Administered 2015-01-05 – 2015-01-08 (×4): 81 mg via ORAL
  Filled 2015-01-05 (×4): qty 1

## 2015-01-05 MED ORDER — FUROSEMIDE 10 MG/ML IJ SOLN
40.0000 mg | Freq: Two times a day (BID) | INTRAMUSCULAR | Status: DC
  Administered 2015-01-05 – 2015-01-07 (×4): 40 mg via INTRAVENOUS
  Filled 2015-01-05 (×4): qty 4

## 2015-01-05 MED ORDER — ENOXAPARIN SODIUM 40 MG/0.4ML ~~LOC~~ SOLN
40.0000 mg | SUBCUTANEOUS | Status: DC
  Administered 2015-01-05 – 2015-01-08 (×4): 40 mg via SUBCUTANEOUS
  Filled 2015-01-05 (×3): qty 0.4

## 2015-01-05 MED ORDER — SODIUM CHLORIDE 0.9 % IJ SOLN: 3.0000 mL | INTRAMUSCULAR | Status: DC | PRN

## 2015-01-05 MED ORDER — ONDANSETRON HCL 4 MG/2ML IJ SOLN
4.0000 mg | Freq: Four times a day (QID) | INTRAMUSCULAR | Status: DC | PRN
  Administered 2015-01-07: 4 mg via INTRAVENOUS
  Filled 2015-01-05: qty 2

## 2015-01-05 MED ORDER — ZIPRASIDONE HCL 20 MG PO CAPS
20.0000 mg | ORAL_CAPSULE | Freq: Every morning | ORAL | Status: DC
  Administered 2015-01-05 – 2015-01-08 (×4): 20 mg via ORAL
  Filled 2015-01-05 (×8): qty 1

## 2015-01-05 MED ORDER — SODIUM CHLORIDE 0.9 % IJ SOLN
3.0000 mL | Freq: Two times a day (BID) | INTRAMUSCULAR | Status: DC
  Administered 2015-01-05 – 2015-01-08 (×7): 3 mL via INTRAVENOUS

## 2015-01-05 MED ORDER — PANTOPRAZOLE SODIUM 40 MG PO TBEC
40.0000 mg | DELAYED_RELEASE_TABLET | Freq: Every morning | ORAL | Status: DC
  Administered 2015-01-05 – 2015-01-08 (×4): 40 mg via ORAL
  Filled 2015-01-05 (×4): qty 1

## 2015-01-05 MED ORDER — FUROSEMIDE 10 MG/ML IJ SOLN
20.0000 mg | Freq: Two times a day (BID) | INTRAMUSCULAR | Status: DC
  Administered 2015-01-05: 20 mg via INTRAVENOUS
  Filled 2015-01-05: qty 2

## 2015-01-05 MED ORDER — MORPHINE SULFATE (PF) 4 MG/ML IV SOLN
INTRAVENOUS | Status: AC
  Administered 2015-01-05: 1 mg
  Filled 2015-01-05: qty 1

## 2015-01-05 MED ORDER — SODIUM CHLORIDE 0.9 % IV SOLN
INTRAVENOUS | Status: AC
  Administered 2015-01-05: 01:00:00 via INTRAVENOUS

## 2015-01-05 MED ORDER — DONEPEZIL HCL 5 MG PO TABS
10.0000 mg | ORAL_TABLET | Freq: Every day | ORAL | Status: DC
  Administered 2015-01-05 – 2015-01-07 (×4): 10 mg via ORAL
  Filled 2015-01-05 (×4): qty 2

## 2015-01-05 MED ORDER — SODIUM CHLORIDE 0.9 % IV SOLN: 250.0000 mL | INTRAVENOUS | Status: DC | PRN

## 2015-01-05 MED ORDER — LEVOTHYROXINE SODIUM 25 MCG PO TABS
125.0000 ug | ORAL_TABLET | Freq: Every day | ORAL | Status: DC
  Administered 2015-01-05 – 2015-01-08 (×4): 125 ug via ORAL
  Filled 2015-01-05 (×8): qty 1

## 2015-01-05 MED ORDER — MIDODRINE HCL 5 MG PO TABS
10.0000 mg | ORAL_TABLET | Freq: Two times a day (BID) | ORAL | Status: DC
  Administered 2015-01-05 – 2015-01-08 (×8): 10 mg via ORAL
  Filled 2015-01-05 (×8): qty 2

## 2015-01-05 MED ORDER — POTASSIUM CHLORIDE CRYS ER 20 MEQ PO TBCR
40.0000 meq | EXTENDED_RELEASE_TABLET | ORAL | Status: AC
  Administered 2015-01-05 (×3): 40 meq via ORAL
  Filled 2015-01-05 (×3): qty 2

## 2015-01-05 MED ORDER — ESCITALOPRAM OXALATE 10 MG PO TABS
20.0000 mg | ORAL_TABLET | Freq: Every morning | ORAL | Status: DC
  Administered 2015-01-05 – 2015-01-08 (×4): 20 mg via ORAL
  Filled 2015-01-05 (×5): qty 2

## 2015-01-05 MED ORDER — ACETAMINOPHEN 650 MG RE SUPP
650.0000 mg | Freq: Four times a day (QID) | RECTAL | Status: DC | PRN
Start: 2015-01-05 — End: 2015-01-08

## 2015-01-05 MED ORDER — ONDANSETRON HCL 4 MG PO TABS: 4.0000 mg | ORAL_TABLET | Freq: Four times a day (QID) | ORAL | Status: DC | PRN

## 2015-01-05 MED ORDER — MORPHINE SULFATE (PF) 2 MG/ML IV SOLN: 1.0000 mg | Freq: Once | INTRAVENOUS | Status: DC

## 2015-01-05 MED ORDER — ACETAMINOPHEN 325 MG PO TABS
650.0000 mg | ORAL_TABLET | Freq: Four times a day (QID) | ORAL | Status: DC | PRN
  Administered 2015-01-05 – 2015-01-08 (×3): 650 mg via ORAL
  Filled 2015-01-05 (×3): qty 2

## 2015-01-05 NOTE — H&P (Signed)
PCP:   Purvis Kilts, MD   Chief Complaint:  Shortness of breath  HPI:  81 year old male who  has a past medical history of Chronic constipation; GERD (gastroesophageal reflux disease); Hyperlipidemia; Orthostatic hypotension (11/19/2013); Coronary artery disease; Mild dementia; Hypothyroidism; History of esophageal dilatation; History of gastritis; History of hiatal hernia; History of bladder cancer; Kidney tumor; BPH (benign prostatic hyperplasia); CKD (chronic kidney disease) stage 3, GFR 30-59 ml/min; History of kidney stones; ED (erectile dysfunction); S/P CABG x 4; Bladder tumor; DVT (deep venous thrombosis); Myocardial infarction (12/2013); Stroke; Chronic combined systolic and diastolic CHF (congestive heart failure); Depression; Hematuria; Heme positive stool; Anemia; Shortness of breath dyspnea; Type 2 diabetes mellitus; Anxiety; Prostate cancer; and OSA on CPAP. Today was brought to the hospital with chief complaints of shortness of breath which has been gradually getting worse since bladder surgery on 15th September. Patient underwent transurethral resection of bladder tumor on 12/24/2014. Since then patient has been gradually getting weaker not eating and drinking, not sleeping. Patient does have a history of dementia and gets hallucinations which has been getting worse. Also patient has been getting short of breath on walking. Patient is unable to provide significant history, as per daughter-in-law he denied chest pain. No nausea vomiting or diarrhea. Patient was supposed to get torsemide once or twice daily which they have been giving him but despite torsemide his breathing has been getting worse. In the ED chest x-ray showed bilateral pleural effusions and perivascular haziness concerning for pulmonary edema, one dose of Lasix 80 kg IV 1 given the ED. Patient also found to have mild elevation of troponin 0.05  Allergies:   Allergies  Allergen Reactions  . Lorazepam    Hallucinations, worsens agitation  . Statins Other (See Comments)    MUSCLE ACHES      Past Medical History  Diagnosis Date  . Chronic constipation   . GERD (gastroesophageal reflux disease)   . Hyperlipidemia   . Orthostatic hypotension 11/19/2013  . Coronary artery disease     a. 12/2013 - STEMI s/p CABGx4.  . Mild dementia   . Hypothyroidism   . History of esophageal dilatation   . History of gastritis   . History of hiatal hernia   . History of bladder cancer     hx  TCC low grade  . Kidney tumor     right  . BPH (benign prostatic hyperplasia)   . CKD (chronic kidney disease) stage 3, GFR 30-59 ml/min     a. h/o nephrectomy.  [name] History of kidney stones   . ED (erectile dysfunction)   . S/P CABG x 4     12-18-2013  . Bladder tumor   . DVT (deep venous thrombosis)     a. s/p IVC filter 09/2014. Xarelto stopped at that time due to hematuria and hemoccult + stools.  . Myocardial infarction 12/2013  . Stroke     TIA  . Chronic combined systolic and diastolic CHF (congestive heart failure)     systolic and diastolic heart failure  . Depression   . Hematuria   . Heme positive stool   . Anemia   . Shortness of breath dyspnea     non change in status per dtr-in-low depends on weather and activity   . Type 2 diabetes mellitus     borderline on no medications   . Anxiety   . Prostate cancer     low-grade and bladder cancer and kidney cancer   . OSA  on CPAP     mild osa   per sleep study 07-11-2009- patient states does not use regularly    Past Surgical History  Procedure Laterality Date  . Upper gastrointestinal endoscopy  04/06/2010  &  01-08-2013    w/ esophageal dilatation  . Hemorrhoid surgery    . Hand surgery      right  . Wrist fracture surgery      pins placed-left  . Cystoscopy with biopsy  01/09/2012    Procedure: CYSTOSCOPY WITH BIOPSY;  Surgeon: Marissa Nestle, MD;  Location: [facility];  Service: Urology;  Laterality: N/A;  Cystoscopy with Multiple  Bladder Biopsies  . Esophagogastroduodenoscopy (egd) with esophageal dilation N/A 01/08/2013    Procedure: ESOPHAGOGASTRODUODENOSCOPY (EGD) WITH ESOPHAGEAL DILATION;  Surgeon: Rogene Houston, MD;  Location: [facility];  Service: Endoscopy;  Laterality: N/A;  120  . Left heart cath N/A 12/17/2013    Procedure: LEFT HEART CATH;  Surgeon: Leonie Man, MD;  Location: [facility] CATH LAB;  Service: Cardiovascular;  Laterality: N/A;  . Left heart catheterization with coronary angiogram N/A 12/17/2013    Procedure: LEFT HEART CATHETERIZATION WITH CORONARY ANGIOGRAM;  Surgeon: Leonie Man, MD;  Location: [facility] CATH LAB;  Service: Cardiovascular;  Laterality: N/A;  . Colonoscopy with esophagogastroduodenoscopy (egd)  08-15-2006    w/ esophageal dilatation  . Umbilical hernia repair  04-24-2003  . Transurethral resection of bladder tumor  06-14-2004  . Transurethral resection of prostate  10-04-2004  . Cysto/  removal bladder calculus/  resection bladder tumor  10-16-2006  . Cystostomy w/ bladder biopsy  01-09-2012  . Cardiac catheterization  12-17-2013   dr Shanon Brow harding    severe disease RCA with thrombotic appearing subtotal occlusions/  95-99%  OM2/  diffuse disease LAD  and D1/  mild to moderate basal to mid inferior hypokinesis/  severe elevated LVEDP/  ef 45-50%  . Coronary artery bypass graft N/A 12/18/2013    Procedure: Coronary artery bypass grafting times four using left internal mammary artery and endoscopically harvested right saphenous vein graft;  Surgeon: Gaye Pollack, MD;  Location: [facility] OR;  Service: Open Heart Surgery;  Laterality: N/A;  . Transthoracic echocardiogram  11-14-2012    grade I diastolic dysfunction/  ef 55-60%/  trivial MR  and TR  . Cardiovascular stress test  08-09-2009  dr berry    mild to moderate perfusion defect in basal , mid, and apical inferior regions consistent with an infart/scar/   No evidence ischemia/  ef 51%/  no ECG changes/   low risk scan  . Transurethral  resection of bladder tumor N/A 04/07/2014    Procedure: TRANSURETHRAL RESECTION OF BLADDER TUMOR (TURBT);  Surgeon: Arvil Persons, MD;  Location: [facility];  Service: Urology;  Laterality: N/A;  . Cystoscopy/retrograde/ureteroscopy Right 04/07/2014    Procedure: CYSTOSCOPY/RETROGRADE/URETEROSCOPY WITH BIOPSY OF RENAL PELVIS TUMOR;  Surgeon: Arvil Persons, MD;  Location: [facility];  Service: Urology;  Laterality: Right;  . Robot assisted laparoscopic nephrectomy Right 06/05/2014    Procedure: ROBOTIC ASSISTED LAPAROSCOPIC RIGHT NEPHROURETERECTOMY, VENTRAL HERNIA REPAIR ;  Surgeon: Ardis Hughs, MD;  Location: [facility];  Service: Urology;  Laterality: Right;  . Cystoscopy w/ ureteral stent placement Right 06/05/2014    Procedure: CYSTOSCOPY ;  Surgeon: Ardis Hughs, MD;  Location: [facility];  Service: Urology;  Laterality: Right;  . Radiology with anesthesia N/A 06/22/2014    Procedure: MRI BRAIN W/O CONTRAST;  Surgeon: Medication  Radiologist, MD;  Location: [place];  Service: Radiology;  Laterality: N/A;  . Eye surgery      cataract surgery  . Hernia repair  63/0160    umbilical hernia repair at time of nephrectomy  . Right kidney removal     . Ivc filter placement ([facility] hx)  09/17/2014   . Transurethral resection of bladder tumor N/A 12/24/2014    Procedure: TRANSURETHRAL RESECTION OF BLADDER TUMOR (TURBT);  Surgeon: Ardis Hughs, MD;  Location: [facility];  Service: Urology;  Laterality: N/A;    Prior to Admission medications   Medication Sig Start Date End Date Taking? Authorizing Provider  aspirin 81 MG chewable tablet Chew 1 tablet (81 mg total) by mouth daily. 08/20/14  Yes Lezlie Octave Black, NP  donepezil (ARICEPT) 10 MG tablet Take 10 mg by mouth at bedtime.   Yes Historical Provider, MD  escitalopram (LEXAPRO) 20 MG tablet Take 20 mg by mouth every morning.    Yes Historical Provider, MD  fludrocortisone (FLORINEF) 0.1 MG tablet Take 0.2 mg by mouth every  morning.    Yes Historical Provider, MD  levothyroxine (SYNTHROID, LEVOTHROID) 125 MCG tablet Take 125 mcg by mouth daily before breakfast.   Yes Historical Provider, MD  midodrine (PROAMATINE) 10 MG tablet Take 10 mg by mouth 2 (two) times daily.   Yes Historical Provider, MD  mirabegron ER (MYRBETRIQ) 50 MG TB24 tablet Take 1 tablet (50 mg total) by mouth every morning. 12/25/14  Yes Ardis Hughs, MD  pantoprazole (PROTONIX) 40 MG tablet TAKE ONE TABLET EVERY MORNING 04/06/14  Yes Butch Penny, NP  polyethylene glycol (MIRALAX / GLYCOLAX) packet Take 17 g by mouth at bedtime.   Yes Historical Provider, MD  potassium chloride SA (K-DUR,KLOR-CON) 20 MEQ tablet Take 20 mEq by mouth every morning.    Yes Historical Provider, MD  torsemide (DEMADEX) 20 MG tablet Take 20-40 mg daily as needed for leg swelling 10/21/14  Yes Herminio Commons, MD  ziprasidone (GEODON) 20 MG capsule Take 20 mg by mouth every morning.  09/15/14  Yes Historical Provider, MD  opium-belladonna (B&O SUPPRETTES) 16.2-60 MG suppository Place 1 suppository rectally every 8 (eight) hours as needed for bladder spasms. 12/25/14   Ardis Hughs, MD    Social History:  reports that he quit smoking about 55 years ago. His smoking use included Cigarettes. He started smoking about 62 years ago. He has a 4 pack-year smoking history. He quit smokeless tobacco use about 55 years ago. His smokeless tobacco use included Chew. He reports that he does not drink alcohol or use illicit drugs.  Family History  Problem Relation Age of Onset  . Cancer - Lung Brother   . Heart disease Mother   . Heart attack Mother     Danley Danker Weights   01/04/15 2039 01/04/15 2221  Weight: 108.863 kg (240 lb) 109.997 kg (242 lb 8 oz)     Review of Systems:  Asthma history of present illness, rest of the review of systems unobtainable   Physical Exam: Blood pressure 127/69, pulse 83, temperature 97.7 F (36.5 C), temperature source Oral, resp.  rate 21, height 5' 10.5" (1.791 m), weight 109.997 kg (242 lb 8 oz), SpO2 97 %. Constitutional:   Patient is a well-developed and well-nourished male in no acute distress and cooperative with exam. Head: Normocephalic and atraumatic Mouth: Mucus membranes moist Eyes: PERRL, EOMI, conjunctivae normal Neck: Supple, No Thyromegaly Cardiovascular: RRR, S1 normal, S2 normal Pulmonary/Chest: Bilateral wheezing  Abdominal: Soft. Non-tender, non-distended, bowel sounds are normal, no masses, organomegaly, or guarding present.  Neurological: A&O x3, Strength is normal and symmetric bilaterally, cranial nerve II-XII are grossly intact, no focal motor deficit, sensory intact to light touch bilaterally.  Extremities : Bilateral 1+ pitting edema of the lower extremities  Labs on Admission:  Basic Metabolic Panel:  Recent Labs Lab 01/04/15 2140  NA 143  K 3.5  CL 100*  CO2 33*  GLUCOSE 121*  BUN 33*  CREATININE 2.06*  CALCIUM 8.4*   CBC:  Recent Labs Lab 01/04/15 2140  WBC 11.5*  NEUTROABS 9.0*  HGB 9.6*  HCT 31.6*  MCV 79.4  PLT 175   Cardiac Enzymes:  Recent Labs Lab 01/04/15 2140  TROPONINI 0.05*    BNP (last 3 results)  Recent Labs  08/17/14 0900 09/18/14 0130 01/04/15 2140  BNP 2588.0* 2240.0* 2464.0*    ProBNP (last 3 results) No results for input(s): PROBNP in the last 8760 hours.  CBG: No results for input(s): GLUCAP in the last 168 hours.  Radiological Exams on Admission: Dg Chest Portable 1 View  01/04/2015   CLINICAL DATA:  Shortness of breath all day.  EXAM: PORTABLE CHEST 1 VIEW  COMPARISON:  09/18/2014  FINDINGS: Patient is post median sternotomy. Lung volumes are low. Hazy opacity at the lung bases, likely pleural effusions. Perivascular haziness concerning for pulmonary edema. No pneumothorax or confluent airspace disease. Cardiomediastinal contours are unchanged allowing for differences in technique.  IMPRESSION: Hypoventilatory chest with bilateral  pleural effusions and perivascular haziness, concerning for pulmonary edema. Findings suggest mild CHF.   Electronically Signed   By: Jeb Levering M.D.   On: 01/04/2015 21:19    EKG: Independently reviewed. Sinus rhythm, prolonged QT interval   Assessment/Plan Active Problems:   S/P CABG x 4   Dementia without behavioral disturbance   Prolonged Q-T interval on ECG   DM type 2 (diabetes mellitus, type 2)   Elevated troponin   CKD (chronic kidney disease) stage 3, GFR 30-59 ml/min   DVT (deep venous thrombosis)   Acute CHF   Acute exacerbation of CHF (congestive heart failure)  CHF exacerbation Patient has both systolic as well as diastolic heart failure, one dose of Lasix 80 mg given the ED. Patient became hypotensive and his blood pressure has now stabilized. We'll start Lasix 20 mg IV every 12 hours from tomorrow morning. We'll also get a cardiac consultation for further management.  Delirium superimposed on dementia Patient presenting with hypoactive delirium, continue Geodon Will avoid Ativan as it may worsen the delirium Continue Aricept  History of DVT Patient has IVC filter placed  Hypothyroidism Continue Synthroid  C KD stage III Patient's creatinine around baseline 2.06 Monitor BUN/creatinine a.m.  Prolonged QT interval Patient has prolonged QT interval likely from the antipsychotic medications Will check serum magnesium level Unfortunately we have to give Geodon as patient undergoes he will disturbance without this medication as per family Will monitor the patient closely in stepdown unit  Code status: DO NOT RESUSCITATE  Family discussion: Admission, patients condition and plan of care including tests being ordered have been discussed with the patient's son and daughter-in-law at bedside* who indicate understanding and agree with the plan and Code Status.   Time Spent on Admission: 60 minutes  [facility] Pager:  [phone] 01/05/2015, 12:19 AM  If 7PM-7AM, please contact night-coverage  [email or web address]  Password [email or web address]

## 2015-01-05 NOTE — Progress Notes (Signed)
Patient seen and examined. Discussed with DIL [name] at bedside. Continue lasix 20 mg BID for now, strive for negative fluid balance. Await cardiology input and recommendations. Cr improving despite diuresis (-3800 cc). Follow.  Domingo Mend, MD [facility] Pager: [phone]

## 2015-01-05 NOTE — Progress Notes (Signed)
Report received from ICU, transferred from ICU via wheelchair, assisted to bed, orders released, foley intact with red urine and sediments, family present.

## 2015-01-05 NOTE — Progress Notes (Signed)
Report called to K. Sherral Hammers, Therapist, sports. Patient transferred to 331 in stable condition via wheelchair with NT.

## 2015-01-05 NOTE — Consult Note (Addendum)
CARDIOLOGY CONSULT NOTE   Patient ID: [name] [name] MRN: [id number] DOB/AGE: 79-10-1933 79 y.o.  Admit Date: 01/04/2015 Referring Physician: PTH-Hernandez MD Primary Physician: Purvis Kilts, MD Consulting Cardiologist: Carlyle Dolly MD Primary Cardiologist : Kate Sable MD Reason for Consultation: CHF  Clinical Summary [name] [name] is a 81 y.o.male with multiple medical problems, focusing on cardiac history and risk factors, , CAD, CABG X 4 in 2015, combined CHF, hyperlipidemia, diabetes, CVA, and DVT. He came to ER with increasing dyspnea after having TURP on 12/24/2014. He has history of dementia, is a poor historian, is not oriented and therefore history is obtained from current and past records.   On arrival to ER, BP 118.87, HR 84, O2 Sat 95%. Afebrile. Creatinine 2.06, BUN 33, K+ 3.5. He was found to be anemic at 9.6, with leukocytosis, WBC 11.5. Pro-BNP 2,464. CXR demonstrated pulmonary edema with bilateral pleural effusions, and CHF. EKG demonstrated incomplete RBBB, prolonged QTc 582 ms. Rate of 82 bpm (essentially unchanged from prior EKGs in 09/2014)).  Troponin 0.05 (x3).    He was treated with lasix, 80 mg daily, dubneb, and albuterol inhalers. He has diuresed 2.3 liters. We are asked for assistance with diuresis, with mixed CHF and cardiorenal syndrome.   Allergies  Allergen Reactions  . Lorazepam     Hallucinations, worsens agitation  . Statins Other (See Comments)    MUSCLE ACHES    Medications Scheduled Medications: . sodium chloride   Intravenous STAT  . aspirin  81 mg Oral Daily  . donepezil  10 mg Oral QHS  . enoxaparin (LOVENOX) injection  40 mg Subcutaneous Q24H  . escitalopram  20 mg Oral q morning - 10a  . fludrocortisone  0.2 mg Oral q morning - 10a  . furosemide  20 mg Intravenous Q12H  . levothyroxine  125 mcg Oral QAC breakfast  . midodrine  10 mg Oral BID  .  morphine injection  1 mg Intravenous Once  . pantoprazole  40 mg Oral q  morning - 10a  . potassium chloride SA  20 mEq Oral q morning - 10a  . sodium chloride  3 mL Intravenous Q12H  . ziprasidone  20 mg Oral q morning - 10a    Infusions:    PRN Medications: sodium chloride, acetaminophen **OR** acetaminophen, ondansetron **OR** ondansetron (ZOFRAN) IV, sodium chloride   Past Medical History  Diagnosis Date  . Chronic constipation   . GERD (gastroesophageal reflux disease)   . Hyperlipidemia   . Orthostatic hypotension 11/19/2013  . Coronary artery disease     a. 12/2013 - STEMI s/p CABGx4.  . Mild dementia   . Hypothyroidism   . History of esophageal dilatation   . History of gastritis   . History of hiatal hernia   . History of bladder cancer     hx  TCC low grade  . Kidney tumor     right  . BPH (benign prostatic hyperplasia)   . CKD (chronic kidney disease) stage 3, GFR 30-59 ml/min     a. h/o nephrectomy.  [name] History of kidney stones   . ED (erectile dysfunction)   . S/P CABG x 4     12-18-2013  . Bladder tumor   . DVT (deep venous thrombosis)     a. s/p IVC filter 09/2014. Xarelto stopped at that time due to hematuria and hemoccult + stools.  . Myocardial infarction 12/2013  . Stroke     TIA  . Chronic combined systolic  and diastolic CHF (congestive heart failure)     systolic and diastolic heart failure  . Depression   . Hematuria   . Heme positive stool   . Anemia   . Shortness of breath dyspnea     non change in status per dtr-in-low depends on weather and activity   . Type 2 diabetes mellitus     borderline on no medications   . Anxiety   . Prostate cancer     low-grade and bladder cancer and kidney cancer   . OSA on CPAP     mild osa   per sleep study 07-11-2009- patient states does not use regularly    Past Surgical History  Procedure Laterality Date  . Upper gastrointestinal endoscopy  04/06/2010  &  01-08-2013    w/ esophageal dilatation  . Hemorrhoid surgery    . Hand surgery      right  . Wrist fracture  surgery      pins placed-left  . Cystoscopy with biopsy  01/09/2012    Procedure: CYSTOSCOPY WITH BIOPSY;  Surgeon: Marissa Nestle, MD;  Location: [facility];  Service: Urology;  Laterality: N/A;  Cystoscopy with Multiple Bladder Biopsies  . Esophagogastroduodenoscopy (egd) with esophageal dilation N/A 01/08/2013    Procedure: ESOPHAGOGASTRODUODENOSCOPY (EGD) WITH ESOPHAGEAL DILATION;  Surgeon: Rogene Houston, MD;  Location: [facility];  Service: Endoscopy;  Laterality: N/A;  120  . Left heart cath N/A 12/17/2013    Procedure: LEFT HEART CATH;  Surgeon: Leonie Man, MD;  Location: [facility] CATH LAB;  Service: Cardiovascular;  Laterality: N/A;  . Left heart catheterization with coronary angiogram N/A 12/17/2013    Procedure: LEFT HEART CATHETERIZATION WITH CORONARY ANGIOGRAM;  Surgeon: Leonie Man, MD;  Location: [facility] CATH LAB;  Service: Cardiovascular;  Laterality: N/A;  . Colonoscopy with esophagogastroduodenoscopy (egd)  08-15-2006    w/ esophageal dilatation  . Umbilical hernia repair  04-24-2003  . Transurethral resection of bladder tumor  06-14-2004  . Transurethral resection of prostate  10-04-2004  . Cysto/  removal bladder calculus/  resection bladder tumor  10-16-2006  . Cystostomy w/ bladder biopsy  01-09-2012  . Cardiac catheterization  12-17-2013   dr Shanon Brow harding    severe disease RCA with thrombotic appearing subtotal occlusions/  95-99%  OM2/  diffuse disease LAD  and D1/  mild to moderate basal to mid inferior hypokinesis/  severe elevated LVEDP/  ef 45-50%  . Coronary artery bypass graft N/A 12/18/2013    Procedure: Coronary artery bypass grafting times four using left internal mammary artery and endoscopically harvested right saphenous vein graft;  Surgeon: Gaye Pollack, MD;  Location: [facility] OR;  Service: Open Heart Surgery;  Laterality: N/A;  . Transthoracic echocardiogram  11-14-2012    grade I diastolic dysfunction/  ef 55-60%/  trivial MR  and TR  . Cardiovascular stress test   08-09-2009  dr berry    mild to moderate perfusion defect in basal , mid, and apical inferior regions consistent with an infart/scar/   No evidence ischemia/  ef 51%/  no ECG changes/   low risk scan  . Transurethral resection of bladder tumor N/A 04/07/2014    Procedure: TRANSURETHRAL RESECTION OF BLADDER TUMOR (TURBT);  Surgeon: Arvil Persons, MD;  Location: [facility];  Service: Urology;  Laterality: N/A;  . Cystoscopy/retrograde/ureteroscopy Right 04/07/2014    Procedure: CYSTOSCOPY/RETROGRADE/URETEROSCOPY WITH BIOPSY OF RENAL PELVIS TUMOR;  Surgeon: Arvil Persons, MD;  Location:  SURGERY  CENTER;  Service: Urology;  Laterality: Right;  . Robot assisted laparoscopic nephrectomy Right 06/05/2014    Procedure: ROBOTIC ASSISTED LAPAROSCOPIC RIGHT NEPHROURETERECTOMY, VENTRAL HERNIA REPAIR ;  Surgeon: Ardis Hughs, MD;  Location: [facility];  Service: Urology;  Laterality: Right;  . Cystoscopy w/ ureteral stent placement Right 06/05/2014    Procedure: CYSTOSCOPY ;  Surgeon: Ardis Hughs, MD;  Location: [facility];  Service: Urology;  Laterality: Right;  . Radiology with anesthesia N/A 06/22/2014    Procedure: MRI BRAIN W/O CONTRAST;  Surgeon: Medication Radiologist, MD;  Location: [facility];  Service: Radiology;  Laterality: N/A;  . Eye surgery      cataract surgery  . Hernia repair  26/3335    umbilical hernia repair at time of nephrectomy  . Right kidney removal     . Ivc filter placement ([facility] hx)  09/17/2014   . Transurethral resection of bladder tumor N/A 12/24/2014    Procedure: TRANSURETHRAL RESECTION OF BLADDER TUMOR (TURBT);  Surgeon: Ardis Hughs, MD;  Location: [facility];  Service: Urology;  Laterality: N/A;    Family History  Problem Relation Age of Onset  . Cancer - Lung Brother   . Heart disease Mother   . Heart attack Mother     Social History [name] [name] reports that he quit smoking about 55 years ago. His smoking use included Cigarettes. He started  smoking about 62 years ago. He has a 4 pack-year smoking history. He quit smokeless tobacco use about 55 years ago. His smokeless tobacco use included Chew. [name] [name] reports that he does not drink alcohol.  Review of Systems Complete review of systems are found to be negative unless outlined in H&P above.  Physical Examination Blood pressure 128/89, pulse 80, temperature 97.8 F (36.6 C), temperature source Oral, resp. rate 21, height 5\' 5"  (1.651 m), weight 232 lb 5.8 oz (105.4 kg), SpO2 100 %.  Intake/Output Summary (Last 24 hours) at 01/05/15 0725 Last data filed at 01/05/15 0500  Gross per 24 hour  Intake    140 ml  Output   2500 ml  Net  -2360 ml    Telemetry: SR with RBBB.   GEN: No acute distress, confused.  HEENT: Conjunctiva and lids normal, oropharynx clear with moist mucosa. Neck: Supple, mild elevated JVP at 90 degrees, or right carotid bruits, no thyromegaly. Lungs: Diminished in the bases, poor inspiratory effort, does not follow commands to take deep breath. Cardiac: Regular rate and rhythm, distant, difficult to asculated no S3 or significant systolic murmur, no pericardial rub. Abdomen: Soft, nontender, no hepatomegaly, bowel sounds present, no guarding or rebound. Extremities: No pitting edema, distal pulses 2+. Skin: Warm and dry. Musculoskeletal: No kyphosis. Neuropsychiatric: Alert not oriented, does not follow commands. Blind and hard of hearing.   Prior Cardiac Testing/Procedures 1. Echocardiogram 08/17/2014 Left ventricle: The cavity size was mildly dilated. Wall thickness was normal. Systolic function was mildly to moderately reduced. The estimated ejection fraction was in the range of 40% to 45%. Findings consistent with left ventricular diastolic dysfunction. Doppler parameters are consistent with high ventricular filling pressure. - Regional wall motion abnormality: Mild hypokinesis of the basal-mid inferior and basal-mid inferolateral  myocardium. - Ventricular septum: Septal motion showed abnormal function and dyssynergy. These changes are consistent with intraventricular conduction delay. - Aortic valve: Moderately calcified annulus. - Mitral valve: There was moderate eccentric regurgitation. - Right ventricle: The cavity size was mildly dilated. Systolic function was moderately to severely reduced. - Tricuspid valve: There  was mild-moderate regurgitation. - Pulmonic valve: Moderately elevated pulmonary pressures (48 mmHg). - Systemic veins: Dilated IVC, with normal respiratory variation. Estimated CVP 8 mmHg.  2. Cardiac Cath 12/17/2013 POST-OPERATIVE DIAGNOSIS:   Severe multivessel disease with inferior ST elevation culprit lesions being the thrombotic subtotal occlusions of the RCA. However there is a 95-99% lesion in the OM 2 vessel diffuse severe disease in the proximal LAD and first diagonal branch  Mildly reduced EF with inferior hypokinesis  Severely elevated LVEDP   3. Coronary artery bypass grafting x 4  12/2013  Left internal mammary graft to the LAD  SVG to diagonal  SVG to OM  SVG to PDA Endoscopic vein harvest from the right leg  Lab Results  Basic Metabolic Panel:  Recent Labs Lab 01/04/15 2140 01/05/15 0540  NA 143 144  K 3.5 2.9*  CL 100* 99*  CO2 33* 36*  GLUCOSE 121* 104*  BUN 33* 32*  CREATININE 2.06* 1.97*  CALCIUM 8.4* 8.4*    Liver Function Tests:  Recent Labs Lab 01/05/15 0540  AST 29  ALT 19  ALKPHOS 69  BILITOT 1.2  PROT 6.6  ALBUMIN 3.6    CBC:  Recent Labs Lab 01/04/15 2140 01/05/15 0540  WBC 11.5* 10.0  NEUTROABS 9.0*  --   HGB 9.6* 9.6*  HCT 31.6* 31.9*  MCV 79.4 79.4  PLT 175 179    Cardiac Enzymes:  Recent Labs Lab 01/04/15 2140 01/05/15 0054 01/05/15 0540  TROPONINI 0.05* 0.05* 0.05*    Radiology: Dg Chest Portable 1 View  01/04/2015   CLINICAL DATA:  Shortness of breath all day.  EXAM: PORTABLE CHEST 1 VIEW   COMPARISON:  09/18/2014  FINDINGS: Patient is post median sternotomy. Lung volumes are low. Hazy opacity at the lung bases, likely pleural effusions. Perivascular haziness concerning for pulmonary edema. No pneumothorax or confluent airspace disease. Cardiomediastinal contours are unchanged allowing for differences in technique.  IMPRESSION: Hypoventilatory chest with bilateral pleural effusions and perivascular haziness, concerning for pulmonary edema. Findings suggest mild CHF.   Electronically Signed   By: Jeb Levering M.D.   On: 01/04/2015 21:19     ECG: NSR with incomplete RBBB.    Impression and Recommendations  1. Acute on Chronic Mixed CHF: Most recent echo in 08/2014 demonstrated EF of 40%-45%, moderate pulmonary hypertension. On lasix 20 mg IV q 12 hours, after receiving 80 mg X 1. Creatinine improving with diuresis. Will increase lasix to 40 mg IV BID. Uncertain of "dry wt." Wt during last office visit in July 2016 with Dr. Bronson Ing, 219 lbs. He apparently had been taken off of diuretics )troesemide and prn metolazone), per Dr. Court Joy note due to CKD and instructed to take lasix prn for edema. Admission wt 240-240 lbs. Current weight 232 lbs. BP is controlled currently. Would not repeat echo unless he does not diurese.  2. CAD: Hx of CABG-  Do not see that he is on BB or statin. Has myalgias with statins. HR is normal. On ASA.  3. Orthostatic Hypotension: He has been on midodrine for this.   4. Anemia: May be dilutional from CHF vs CKD.[name] Has had hx of hematuria in the past due to use of Xarelto for DVT. He is not on anticoagulation at this time.         Signed: Phill Myron. Lawrence NP [place]  01/05/2015, 7:25 AM Co-Sign MD  Patient seen and discussed with NP Purcell Nails, I agree with her documentation. 81 yo male history of chronic  combined systolic/diastolic HF with echo 12/3110 with LVEF 40-45%, abnormal diastolic function, mod to severe RV dysfunction, hx of of DVT now  with IVC filter due to bladder tumor and hematuria, bladder tumor s/p resection 12/24/14,CKD 3, CAD s/p 4 vessel CABG admitted with SOB.   ER vials p 84 bp 118/87 95% RA Weight 240 lbs BNP 2464 (previous 2224), Hgb 9.6, Plt 175, K 3.5, Cr 2.06 (baseline around 2),  Trop 0.05 x 3 CXR bilateral effusion, pulmonary edema 08/2014 echo LVEF 40-45%, abnormal diastolic function EKG SR, IRBBB Acute on chronic combined systolic/diastolic HF. He is negative 2.3 liters overnight, negative 3.8 liters. since admission. Cr trending down with diuresis consistent with venous congestion and CHF. He is on lasix 40mg  IV bid, will continue. Flat troponin that is only mildly elevated, likely due to CHF and CKD, do not suspect ACS. Continue diuretics today. We will hold his florinef today while we are diuresing. If recurrent edema he had room to go up on his midodrine in order to decrease his florinef. CHF medications in general limited chronic hypotension.     Zandra Abts MD

## 2015-01-05 NOTE — Care Management Note (Signed)
Case Management Note  Patient Details  Name: [name] [name] MRN: [id number] Date of Birth: January 06, 1934  Expected Discharge Date:  01/09/15               Expected Discharge Plan:  [place]  In-House Referral:  NA  Discharge planning Services  CM Consult  Post Acute Care Choice:  Home Health Choice offered to:  Patient  DME Arranged:    DME Agency:     HH Arranged:  RN [place] Agency:  [facility]  Status of Service:  In process, will continue to follow  Medicare Important Message Given:    Date Medicare IM Given:    Medicare IM give by:    Date Additional Medicare IM Given:    Additional Medicare Important Message give by:     If discussed at [place] of Stay Meetings, dates discussed:    Additional Comments: Pt is from home, lives alone but has 24/7 supervision by family and PD aids. Pt has dementia but is ind with ADL's and according to family has cane, walker, wheelchair, and BSC if he were to need them. Pt has used [facility] in the past and family request them again if needed. Pt admitted with CHF exacerbation. Anticipate pt will benefit from [facility] RN for disease management at DC. Will cont to follow for DC planning.  Sherald Barge, RN 01/05/2015, 1:38 PM

## 2015-01-06 ENCOUNTER — Inpatient Hospital Stay (HOSPITAL_COMMUNITY): Payer: Medicare Other

## 2015-01-06 ENCOUNTER — Ambulatory Visit: Payer: Self-pay | Admitting: Physician Assistant

## 2015-01-06 LAB — BASIC METABOLIC PANEL
ANION GAP: 10 (ref 5–15)
BUN: 28 mg/dL — ABNORMAL HIGH (ref 6–20)
CHLORIDE: 99 mmol/L — AB (ref 101–111)
CO2: 34 mmol/L — ABNORMAL HIGH (ref 22–32)
Calcium: 8.7 mg/dL — ABNORMAL LOW (ref 8.9–10.3)
Creatinine, Ser: 1.82 mg/dL — ABNORMAL HIGH (ref 0.61–1.24)
GFR calc non Af Amer: 34 mL/min — ABNORMAL LOW (ref >60)
GFR, EST AFRICAN AMERICAN: 39 mL/min — AB (ref >60)
GLUCOSE: 91 mg/dL (ref 65–99)
POTASSIUM: 3.2 mmol/L — AB (ref 3.5–5.1)
Sodium: 143 mmol/L (ref 135–145)

## 2015-01-06 MED ORDER — HALOPERIDOL 2 MG PO TABS
1.0000 mg | ORAL_TABLET | ORAL | Status: DC | PRN
  Administered 2015-01-06 – 2015-01-08 (×3): 1 mg via ORAL
  Filled 2015-01-06 (×3): qty 1

## 2015-01-06 NOTE — Progress Notes (Signed)
[facility] PROGRESS NOTE  [name] [name] MHD:[id number] DOB: [birth year]    PCP:   Purvis Kilts, MD   HPI: [name] [name] is an 81 y.o. male with hx of CAD, CHF, orthostasis, chronic constipation, dementia, bladder cancer, s/p TURBT Sept 15, hx of esophageal dilatation many times (Dr Dereck Leep), admitted for CHF, being diuresed, after cardiology consultation.  He is feeling better, but continue with have dysphagia, no odynophagia.  He is due to see Dr Louis Meckel of urology for his bladder surgery follow up.   Rewiew of Systems:  Constitutional: Negative for malaise, fever and chills. No significant weight loss or weight gain Eyes: Negative for eye pain, redness and discharge, diplopia, visual changes, or flashes of light. ENMT: Negative for ear pain, hoarseness, nasal congestion, sinus pressure and sore throat. No headaches; tinnitus, drooling, or problem swallowing. Cardiovascular: Negative for chest pain, palpitations, diaphoresis, dyspnea and peripheral edema. ; No orthopnea, PND Respiratory: Negative for cough, hemoptysis, wheezing and stridor. No pleuritic chestpain. Gastrointestinal: Negative for nausea, vomiting, diarrhea, constipation, abdominal pain, melena, blood in stool, hematemesis, jaundice and rectal bleeding.    Genitourinary: Negative for frequency, dysuria, incontinence,flank pain and hematuria; Musculoskeletal: Negative for back pain and neck pain. Negative for swelling and trauma.;  Skin: . Negative for pruritus, rash, abrasions, bruising and skin lesion.; ulcerations Neuro: Negative for headache, lightheadedness and neck stiffness. Negative for weakness, altered level of consciousness , altered mental status, extremity weakness, burning feet, involuntary movement, seizure and syncope.  Psych: negative for anxiety, depression, insomnia, tearfulness, panic attacks, hallucinations, paranoia, suicidal or homicidal ideation    Past Medical History  Diagnosis Date  .  Chronic constipation   . GERD (gastroesophageal reflux disease)   . Hyperlipidemia   . Orthostatic hypotension 11/19/2013  . Coronary artery disease     a. 12/2013 - STEMI s/p CABGx4.  . Mild dementia   . Hypothyroidism   . History of esophageal dilatation   . History of gastritis   . History of hiatal hernia   . History of bladder cancer     hx  TCC low grade  . Kidney tumor     right  . BPH (benign prostatic hyperplasia)   . CKD (chronic kidney disease) stage 3, GFR 30-59 ml/min     a. h/o nephrectomy.  [name] History of kidney stones   . ED (erectile dysfunction)   . S/P CABG x 4     12-18-2013  . Bladder tumor   . DVT (deep venous thrombosis)     a. s/p IVC filter 09/2014. Xarelto stopped at that time due to hematuria and hemoccult + stools.  . Myocardial infarction 12/2013  . Stroke     TIA  . Chronic combined systolic and diastolic CHF (congestive heart failure)     systolic and diastolic heart failure  . Depression   . Hematuria   . Heme positive stool   . Anemia   . Shortness of breath dyspnea     non change in status per dtr-in-low depends on weather and activity   . Type 2 diabetes mellitus     borderline on no medications   . Anxiety   . Prostate cancer     low-grade and bladder cancer and kidney cancer   . OSA on CPAP     mild osa   per sleep study 07-11-2009- patient states does not use regularly    Past Surgical History  Procedure Laterality Date  . Upper gastrointestinal endoscopy  04/06/2010  &  01-08-2013    w/ esophageal dilatation  . Hemorrhoid surgery    . Hand surgery      right  . Wrist fracture surgery      pins placed-left  . Cystoscopy with biopsy  01/09/2012    Procedure: CYSTOSCOPY WITH BIOPSY;  Surgeon: Marissa Nestle, MD;  Location: [facility];  Service: Urology;  Laterality: N/A;  Cystoscopy with Multiple Bladder Biopsies  . Esophagogastroduodenoscopy (egd) with esophageal dilation N/A 01/08/2013    Procedure: ESOPHAGOGASTRODUODENOSCOPY  (EGD) WITH ESOPHAGEAL DILATION;  Surgeon: Rogene Houston, MD;  Location: [facility];  Service: Endoscopy;  Laterality: N/A;  120  . Left heart cath N/A 12/17/2013    Procedure: LEFT HEART CATH;  Surgeon: Leonie Man, MD;  Location: [facility] CATH LAB;  Service: Cardiovascular;  Laterality: N/A;  . Left heart catheterization with coronary angiogram N/A 12/17/2013    Procedure: LEFT HEART CATHETERIZATION WITH CORONARY ANGIOGRAM;  Surgeon: Leonie Man, MD;  Location: [facility] CATH LAB;  Service: Cardiovascular;  Laterality: N/A;  . Colonoscopy with esophagogastroduodenoscopy (egd)  08-15-2006    w/ esophageal dilatation  . Umbilical hernia repair  04-24-2003  . Transurethral resection of bladder tumor  06-14-2004  . Transurethral resection of prostate  10-04-2004  . Cysto/  removal bladder calculus/  resection bladder tumor  10-16-2006  . Cystostomy w/ bladder biopsy  01-09-2012  . Cardiac catheterization  12-17-2013   dr Shanon Brow harding    severe disease RCA with thrombotic appearing subtotal occlusions/  95-99%  OM2/  diffuse disease LAD  and D1/  mild to moderate basal to mid inferior hypokinesis/  severe elevated LVEDP/  ef 45-50%  . Coronary artery bypass graft N/A 12/18/2013    Procedure: Coronary artery bypass grafting times four using left internal mammary artery and endoscopically harvested right saphenous vein graft;  Surgeon: Gaye Pollack, MD;  Location: [facility] OR;  Service: Open Heart Surgery;  Laterality: N/A;  . Transthoracic echocardiogram  11-14-2012    grade I diastolic dysfunction/  ef 55-60%/  trivial MR  and TR  . Cardiovascular stress test  08-09-2009  dr berry    mild to moderate perfusion defect in basal , mid, and apical inferior regions consistent with an infart/scar/   No evidence ischemia/  ef 51%/  no ECG changes/   low risk scan  . Transurethral resection of bladder tumor N/A 04/07/2014    Procedure: TRANSURETHRAL RESECTION OF BLADDER TUMOR (TURBT);  Surgeon: Arvil Persons, MD;   Location: [facility];  Service: Urology;  Laterality: N/A;  . Cystoscopy/retrograde/ureteroscopy Right 04/07/2014    Procedure: CYSTOSCOPY/RETROGRADE/URETEROSCOPY WITH BIOPSY OF RENAL PELVIS TUMOR;  Surgeon: Arvil Persons, MD;  Location: [facility];  Service: Urology;  Laterality: Right;  . Robot assisted laparoscopic nephrectomy Right 06/05/2014    Procedure: ROBOTIC ASSISTED LAPAROSCOPIC RIGHT NEPHROURETERECTOMY, VENTRAL HERNIA REPAIR ;  Surgeon: Ardis Hughs, MD;  Location: [facility];  Service: Urology;  Laterality: Right;  . Cystoscopy w/ ureteral stent placement Right 06/05/2014    Procedure: CYSTOSCOPY ;  Surgeon: Ardis Hughs, MD;  Location: [facility];  Service: Urology;  Laterality: Right;  . Radiology with anesthesia N/A 06/22/2014    Procedure: MRI BRAIN W/O CONTRAST;  Surgeon: Medication Radiologist, MD;  Location: [facility];  Service: Radiology;  Laterality: N/A;  . Eye surgery      cataract surgery  . Hernia repair  [id number]    umbilical hernia repair at time of nephrectomy  .  Right kidney removal     . Ivc filter placement ([facility] hx)  09/17/2014   . Transurethral resection of bladder tumor N/A 12/24/2014    Procedure: TRANSURETHRAL RESECTION OF BLADDER TUMOR (TURBT);  Surgeon: Ardis Hughs, MD;  Location: [facility];  Service: Urology;  Laterality: N/A;    Medications:  HOME MEDS: Prior to Admission medications   Medication Sig Start Date End Date Taking? Authorizing Provider  aspirin 81 MG chewable tablet Chew 1 tablet (81 mg total) by mouth daily. 08/20/14  Yes Lezlie Octave Black, NP  donepezil (ARICEPT) 10 MG tablet Take 10 mg by mouth at bedtime.   Yes Historical Provider, MD  escitalopram (LEXAPRO) 20 MG tablet Take 20 mg by mouth every morning.    Yes Historical Provider, MD  fludrocortisone (FLORINEF) 0.1 MG tablet Take 0.2 mg by mouth every morning.    Yes Historical Provider, MD  levothyroxine (SYNTHROID, LEVOTHROID) 125 MCG tablet Take 125 mcg  by mouth daily before breakfast.   Yes Historical Provider, MD  midodrine (PROAMATINE) 10 MG tablet Take 10 mg by mouth 2 (two) times daily.   Yes Historical Provider, MD  mirabegron ER (MYRBETRIQ) 50 MG TB24 tablet Take 1 tablet (50 mg total) by mouth every morning. 12/25/14  Yes Ardis Hughs, MD  pantoprazole (PROTONIX) 40 MG tablet TAKE ONE TABLET EVERY MORNING 04/06/14  Yes Butch Penny, NP  polyethylene glycol (MIRALAX / GLYCOLAX) packet Take 17 g by mouth at bedtime.   Yes Historical Provider, MD  potassium chloride SA (K-DUR,KLOR-CON) 20 MEQ tablet Take 20 mEq by mouth every morning.    Yes Historical Provider, MD  torsemide (DEMADEX) 20 MG tablet Take 20-40 mg daily as needed for leg swelling 10/21/14  Yes Herminio Commons, MD  ziprasidone (GEODON) 20 MG capsule Take 20 mg by mouth every morning.  09/15/14  Yes Historical Provider, MD  opium-belladonna (B&O SUPPRETTES) 16.2-60 MG suppository Place 1 suppository rectally every 8 (eight) hours as needed for bladder spasms. 12/25/14   Ardis Hughs, MD     Allergies:  Allergies  Allergen Reactions  . Lorazepam     Hallucinations, worsens agitation  . Statins Other (See Comments)    MUSCLE ACHES    Social History:   reports that he quit smoking about 55 years ago. His smoking use included Cigarettes. He started smoking about 62 years ago. He has a 4 pack-year smoking history. He quit smokeless tobacco use about 55 years ago. His smokeless tobacco use included Chew. He reports that he does not drink alcohol or use illicit drugs.  Family History: Family History  Problem Relation Age of Onset  . Cancer - Lung Brother   . Heart disease Mother   . Heart attack Mother      Physical Exam: Filed Vitals:   01/05/15 0900 01/05/15 1000 01/05/15 2200 01/06/15 0652  BP: 136/66 137/80 132/89 134/81  Pulse: 104 150 79 78  Temp:   98.2 F (36.8 C) 98 F (36.7 C)  TempSrc:   Oral Oral  Resp: 20 17 20 14   Height:      Weight:       SpO2: 97% 98% 95% 97%   Blood pressure 134/81, pulse 78, temperature 98 F (36.7 C), temperature source Oral, resp. rate 14, height 5\' 5"  (1.651 m), weight 105.4 kg (232 lb 5.8 oz), SpO2 97 %.  GEN:  Pleasant  patient lying in the stretcher in no acute distress; cooperative with exam. PSYCH:  alert  and oriented x4; does not appear anxious or depressed; affect is appropriate. HEENT: Mucous membranes pink and anicteric; PERRLA; EOM intact; no cervical lymphadenopathy nor thyromegaly or carotid bruit; no JVD; There were no stridor. Neck is very supple. Breasts:: Not examined CHEST WALL: No tenderness CHEST: Normal respiration, clear to auscultation bilaterally.  HEART: Regular rate and rhythm.  There are no murmur, rub, or gallops.   BACK: No kyphosis or scoliosis; no CVA tenderness ABDOMEN: soft and non-tender; no masses, no organomegaly, normal abdominal bowel sounds; no pannus; no intertriginous candida. There is no rebound and no distention. Rectal Exam: Not done EXTREMITIES: No bone or joint deformity; age-appropriate arthropathy of the hands and knees; no edema; no ulcerations.  There is no calf tenderness. Genitalia: not examined PULSES: 2+ and symmetric SKIN: Normal hydration no rash or ulceration CNS: Cranial nerves 2-12 grossly intact no focal lateralizing neurologic deficit.  Speech is fluent; uvula elevated with phonation, facial symmetry and tongue midline. DTR are normal bilaterally, cerebella exam is intact, barbinski is negative and strengths are equaled bilaterally.  No sensory loss.   Labs on Admission:  Basic Metabolic Panel:  Recent Labs Lab 01/04/15 2140 01/05/15 0540 01/06/15 0634  NA 143 144 143  K 3.5 2.9* 3.2*  CL 100* 99* 99*  CO2 33* 36* 34*  GLUCOSE 121* 104* 91  BUN 33* 32* 28*  CREATININE 2.06* 1.97* 1.82*  CALCIUM 8.4* 8.4* 8.7*   Liver Function Tests:  Recent Labs Lab 01/05/15 0540  AST 29  ALT 19  ALKPHOS 69  BILITOT 1.2  PROT 6.6   ALBUMIN 3.6   No results for input(s): LIPASE, AMYLASE in the last 168 hours. No results for input(s): AMMONIA in the last 168 hours. CBC:  Recent Labs Lab 01/04/15 2140 01/05/15 0540  WBC 11.5* 10.0  NEUTROABS 9.0*  --   HGB 9.6* 9.6*  HCT 31.6* 31.9*  MCV 79.4 79.4  PLT 175 179   Cardiac Enzymes:  Recent Labs Lab 01/04/15 2140 01/05/15 0054 01/05/15 0540 01/05/15 1316  TROPONINI 0.05* 0.05* 0.05* 0.05*    CBG: No results for input(s): GLUCAP in the last 168 hours.   Radiological Exams on Admission: Dg Chest Port 1 View  01/06/2015   CLINICAL DATA:  Shortness breath, CHF, recent bladder surgery, history of bladder cancer and prostate cancer  EXAM: PORTABLE CHEST 1 VIEW  COMPARISON:  01/04/2015  FINDINGS: Cardiomegaly with pulmonary vascular congestion. No frank interstitial edema.  Small right pleural effusion. Left lung base is partially excluded. No pneumothorax.  Postsurgical changes related to prior CABG.  IMPRESSION: Cardiomegaly pulmonary vascular congestion. No frank interstitial edema.  Small right pleural effusion.   Electronically Signed   By: Julian Hy M.D.   On: 01/06/2015 10:19   Dg Chest Portable 1 View  01/04/2015   CLINICAL DATA:  Shortness of breath all day.  EXAM: PORTABLE CHEST 1 VIEW  COMPARISON:  09/18/2014  FINDINGS: Patient is post median sternotomy. Lung volumes are low. Hazy opacity at the lung bases, likely pleural effusions. Perivascular haziness concerning for pulmonary edema. No pneumothorax or confluent airspace disease. Cardiomediastinal contours are unchanged allowing for differences in technique.  IMPRESSION: Hypoventilatory chest with bilateral pleural effusions and perivascular haziness, concerning for pulmonary edema. Findings suggest mild CHF.   Electronically Signed   By: Jeb Levering M.D.   On: 01/04/2015 21:19   Assessment/Plan Present on Admission:  . Prolonged Q-T interval on ECG . Elevated troponin . DVT (deep venous  thrombosis) .  Dementia without behavioral disturbance . CKD (chronic kidney disease) stage 3, GFR 30-59 ml/min  PLAN:  Acute on chronic combined CHF:  Being duresed more as he is compensated, but is volume overload.  Cardiology following and making recommendations.    Hypotension:  Florinef being held.  Midodrine being continued.  Stable.  Hypokalemia:  In the setting of CKD.  Will continue to replete.  Dysphagia:  I spoke with Dr Dereck Leep, and he will see him in follow up outpatient.  TURBT:   Will speak with Dr Louis Meckel regarding follow up and foley.   No problem at this time.   Other plans as per orders.  Code Status: DNR.    Orvan Falconer, MD. [facility] Pager [phone] 7pm to 7am.  01/06/2015, 3:18 PM

## 2015-01-06 NOTE — Progress Notes (Signed)
Patient ID: [name] [name], male   DOB: [birth year], 81 y.o.   MRN: [id number]    Subjective:    No SOB  Objective:   Temp:  [98 F (36.7 C)-98.2 F (36.8 C)] 98 F (36.7 C) (09/28 0652) Pulse Rate:  [78-150] 78 (09/28 0652) Resp:  [14-20] 14 (09/28 0652) BP: (132-137)/(80-89) 134/81 mmHg (09/28 0652) SpO2:  [95 %-98 %] 97 % (09/28 0652) Last BM Date: 01/03/15  Filed Weights   01/04/15 2221 01/05/15 0050 01/05/15 0500  Weight: 242 lb 8 oz (109.997 kg) 235 lb 0.2 oz (106.6 kg) 232 lb 5.8 oz (105.4 kg)    Intake/Output Summary (Last 24 hours) at 01/06/15 0900 Last data filed at 01/06/15 0840  Gross per 24 hour  Intake    240 ml  Output   2300 ml  Net  -2060 ml    Telemetry: NSR  Exam:  General: NAD  Resp: crackles bilateral bases  Cardiac: RRR, no m/r/g, no JVD  GI: abdomen soft, NT, ND  MSK: trace bilateral edema  Neuro: no focal deficits  Psych: appropriate affect  Lab Results:  Basic Metabolic Panel:  Recent Labs Lab 01/04/15 2140 01/05/15 0540 01/06/15 0634  NA 143 144 143  K 3.5 2.9* 3.2*  CL 100* 99* 99*  CO2 33* 36* 34*  GLUCOSE 121* 104* 91  BUN 33* 32* 28*  CREATININE 2.06* 1.97* 1.82*  CALCIUM 8.4* 8.4* 8.7*    Liver Function Tests:  Recent Labs Lab 01/05/15 0540  AST 29  ALT 19  ALKPHOS 69  BILITOT 1.2  PROT 6.6  ALBUMIN 3.6    CBC:  Recent Labs Lab 01/04/15 2140 01/05/15 0540  WBC 11.5* 10.0  HGB 9.6* 9.6*  HCT 31.6* 31.9*  MCV 79.4 79.4  PLT 175 179    Cardiac Enzymes:  Recent Labs Lab 01/05/15 0054 01/05/15 0540 01/05/15 1316  TROPONINI 0.05* 0.05* 0.05*    BNP: No results for input(s): PROBNP in the last 8760 hours.  Coagulation: No results for input(s): INR in the last 168 hours.  ECG:   Medications:   Scheduled Medications: . aspirin  81 mg Oral Daily  . donepezil  10 mg Oral QHS  . enoxaparin (LOVENOX) injection  40 mg Subcutaneous Q24H  . escitalopram  20 mg Oral q morning - 10a  .  furosemide  40 mg Intravenous Q12H  . levothyroxine  125 mcg Oral QAC breakfast  . midodrine  10 mg Oral BID  .  morphine injection  1 mg Intravenous Once  . pantoprazole  40 mg Oral q morning - 10a  . potassium chloride SA  20 mEq Oral q morning - 10a  . sodium chloride  3 mL Intravenous Q12H  . ziprasidone  20 mg Oral q morning - 10a     Infusions:     PRN Medications:  sodium chloride, acetaminophen **OR** acetaminophen, ondansetron **OR** ondansetron (ZOFRAN) IV, sodium chloride     Assessment/Plan    1. Acute on chronic combined systolic/diastolic HF - echo 06/1515 with LVEF 40-45%, abnormal diastolic function, mod to severe RV dysfunction - negative 3.7 liters yesterday, negative 5.8 liters since admission. He is on lasix 40mg  IV bid. Downtrend in Cr and BUN with diuresis consistent with venous congestion and CHF - continue diuretics today  2. Chronic hypotension - florinef on hold during diuresis. Continue midodrine. BP's are normal - will follow pressures. Given problems with fluid retention likely will titrate up midodrine if needed and try to  avoid florinef.  - consider home compression stockings.   3.Hypokalemia - replacement per primary team    Carlyle Dolly, M.D

## 2015-01-07 LAB — BASIC METABOLIC PANEL
ANION GAP: 10 (ref 5–15)
BUN: 32 mg/dL — ABNORMAL HIGH (ref 6–20)
CALCIUM: 8.5 mg/dL — AB (ref 8.9–10.3)
CO2: 29 mmol/L (ref 22–32)
Chloride: 102 mmol/L (ref 101–111)
Creatinine, Ser: 1.9 mg/dL — ABNORMAL HIGH (ref 0.61–1.24)
GFR, EST AFRICAN AMERICAN: 37 mL/min — AB (ref >60)
GFR, EST NON AFRICAN AMERICAN: 32 mL/min — AB (ref >60)
GLUCOSE: 138 mg/dL — AB (ref 65–99)
POTASSIUM: 3.5 mmol/L (ref 3.5–5.1)
Sodium: 141 mmol/L (ref 135–145)

## 2015-01-07 LAB — MAGNESIUM: Magnesium: 2.2 mg/dL (ref 1.7–2.4)

## 2015-01-07 MED ORDER — TORSEMIDE 20 MG PO TABS
20.0000 mg | ORAL_TABLET | Freq: Every day | ORAL | Status: DC
  Filled 2015-01-07: qty 1

## 2015-01-07 MED ORDER — TORSEMIDE 20 MG PO TABS: 20.0000 mg | ORAL_TABLET | Freq: Every day | ORAL | Status: DC

## 2015-01-07 NOTE — Plan of Care (Signed)
Problem: Phase I Progression Outcomes Goal: Voiding-avoid urinary catheter unless indicated Outcome: Not Progressing Foley to remain in place for urologic reasons

## 2015-01-07 NOTE — Progress Notes (Signed)
[facility] PROGRESS NOTE  [name] [name] GNF:[id number] DOB: 11/12/1933    PCP:   Purvis Kilts, MD   HPI:   [name] [name] is an 81 y.o. male with hx of CAD, CHF, orthostasis, chronic constipation, dementia, bladder cancer, s/p TURBT Sept 15, hx of esophageal dilatation many times (Dr Dereck Leep), admitted for CHF, being diuresed, after cardiology consultation. He is feeling better, but continue with have dysphagia, no odynophagia. He is due to see Dr Louis Meckel of urology for his bladder surgery follow up.  Dr Harl Bowie adjusted his cardiac meds again this am.  I was able to speak with Dr Louis Meckel, and he recommended to leave foley in at this time until scheduled follow up.   Rewiew of Systems:  Constitutional: Negative for malaise, fever and chills. No significant weight loss or weight gain Eyes: Negative for eye pain, redness and discharge, diplopia, visual changes, or flashes of light. ENMT: Negative for ear pain, hoarseness, nasal congestion, sinus pressure and sore throat. No headaches; tinnitus, drooling, or problem swallowing. Cardiovascular: Negative for chest pain, palpitations, diaphoresis, dyspnea and peripheral edema. ; No orthopnea, PND Respiratory: Negative for cough, hemoptysis, wheezing and stridor. No pleuritic chestpain. Gastrointestinal: Negative for nausea, vomiting, diarrhea, constipation, abdominal pain, melena, blood in stool, hematemesis, jaundice and rectal bleeding.    Genitourinary: Negative for frequency, dysuria, incontinence,flank pain and hematuria; Musculoskeletal: Negative for back pain and neck pain. Negative for swelling and trauma.;  Skin: . Negative for pruritus, rash, abrasions, bruising and skin lesion.; ulcerations Neuro: Negative for headache, lightheadedness and neck stiffness. Negative for weakness, altered level of consciousness , altered mental status, extremity weakness, burning feet, involuntary movement, seizure and syncope.  Psych: negative  for anxiety, depression, insomnia, tearfulness, panic attacks, hallucinations, paranoia, suicidal or homicidal ideation    Past Medical History  Diagnosis Date  . Chronic constipation   . GERD (gastroesophageal reflux disease)   . Hyperlipidemia   . Orthostatic hypotension 11/19/2013  . Coronary artery disease     a. 12/2013 - STEMI s/p CABGx4.  . Mild dementia   . Hypothyroidism   . History of esophageal dilatation   . History of gastritis   . History of hiatal hernia   . History of bladder cancer     hx  TCC low grade  . Kidney tumor     right  . BPH (benign prostatic hyperplasia)   . CKD (chronic kidney disease) stage 3, GFR 30-59 ml/min     a. h/o nephrectomy.  [name] History of kidney stones   . ED (erectile dysfunction)   . S/P CABG x 4     12-18-2013  . Bladder tumor   . DVT (deep venous thrombosis)     a. s/p IVC filter 09/2014. Xarelto stopped at that time due to hematuria and hemoccult + stools.  . Myocardial infarction 12/2013  . Stroke     TIA  . Chronic combined systolic and diastolic CHF (congestive heart failure)     systolic and diastolic heart failure  . Depression   . Hematuria   . Heme positive stool   . Anemia   . Shortness of breath dyspnea     non change in status per dtr-in-low depends on weather and activity   . Type 2 diabetes mellitus     borderline on no medications   . Anxiety   . Prostate cancer     low-grade and bladder cancer and kidney cancer   . OSA on CPAP  mild osa   per sleep study 07-11-2009- patient states does not use regularly    Past Surgical History  Procedure Laterality Date  . Upper gastrointestinal endoscopy  04/06/2010  &  01-08-2013    w/ esophageal dilatation  . Hemorrhoid surgery    . Hand surgery      right  . Wrist fracture surgery      pins placed-left  . Cystoscopy with biopsy  01/09/2012    Procedure: CYSTOSCOPY WITH BIOPSY;  Surgeon: Marissa Nestle, MD;  Location: [facility];  Service: Urology;  Laterality:  N/A;  Cystoscopy with Multiple Bladder Biopsies  . Esophagogastroduodenoscopy (egd) with esophageal dilation N/A 01/08/2013    Procedure: ESOPHAGOGASTRODUODENOSCOPY (EGD) WITH ESOPHAGEAL DILATION;  Surgeon: Rogene Houston, MD;  Location: [facility];  Service: Endoscopy;  Laterality: N/A;  120  . Left heart cath N/A 12/17/2013    Procedure: LEFT HEART CATH;  Surgeon: Leonie Man, MD;  Location: [facility] CATH LAB;  Service: Cardiovascular;  Laterality: N/A;  . Left heart catheterization with coronary angiogram N/A 12/17/2013    Procedure: LEFT HEART CATHETERIZATION WITH CORONARY ANGIOGRAM;  Surgeon: Leonie Man, MD;  Location: [facility] CATH LAB;  Service: Cardiovascular;  Laterality: N/A;  . Colonoscopy with esophagogastroduodenoscopy (egd)  08-15-2006    w/ esophageal dilatation  . Umbilical hernia repair  04-24-2003  . Transurethral resection of bladder tumor  06-14-2004  . Transurethral resection of prostate  10-04-2004  . Cysto/  removal bladder calculus/  resection bladder tumor  10-16-2006  . Cystostomy w/ bladder biopsy  01-09-2012  . Cardiac catheterization  12-17-2013   dr Shanon Brow harding    severe disease RCA with thrombotic appearing subtotal occlusions/  95-99%  OM2/  diffuse disease LAD  and D1/  mild to moderate basal to mid inferior hypokinesis/  severe elevated LVEDP/  ef 45-50%  . Coronary artery bypass graft N/A 12/18/2013    Procedure: Coronary artery bypass grafting times four using left internal mammary artery and endoscopically harvested right saphenous vein graft;  Surgeon: Gaye Pollack, MD;  Location: [facility] OR;  Service: Open Heart Surgery;  Laterality: N/A;  . Transthoracic echocardiogram  11-14-2012    grade I diastolic dysfunction/  ef 55-60%/  trivial MR  and TR  . Cardiovascular stress test  08-09-2009  dr berry    mild to moderate perfusion defect in basal , mid, and apical inferior regions consistent with an infart/scar/   No evidence ischemia/  ef 51%/  no ECG changes/   low  risk scan  . Transurethral resection of bladder tumor N/A 04/07/2014    Procedure: TRANSURETHRAL RESECTION OF BLADDER TUMOR (TURBT);  Surgeon: Arvil Persons, MD;  Location: [facility];  Service: Urology;  Laterality: N/A;  . Cystoscopy/retrograde/ureteroscopy Right 04/07/2014    Procedure: CYSTOSCOPY/RETROGRADE/URETEROSCOPY WITH BIOPSY OF RENAL PELVIS TUMOR;  Surgeon: Arvil Persons, MD;  Location: [facility];  Service: Urology;  Laterality: Right;  . Robot assisted laparoscopic nephrectomy Right 06/05/2014    Procedure: ROBOTIC ASSISTED LAPAROSCOPIC RIGHT NEPHROURETERECTOMY, VENTRAL HERNIA REPAIR ;  Surgeon: Ardis Hughs, MD;  Location: [facility];  Service: Urology;  Laterality: Right;  . Cystoscopy w/ ureteral stent placement Right 06/05/2014    Procedure: CYSTOSCOPY ;  Surgeon: Ardis Hughs, MD;  Location: [facility];  Service: Urology;  Laterality: Right;  . Radiology with anesthesia N/A 06/22/2014    Procedure: MRI BRAIN W/O CONTRAST;  Surgeon: Medication Radiologist, MD;  Location: [place];  Service: Radiology;  Laterality: N/A;  . Eye surgery      cataract surgery  . Hernia repair  56/3875    umbilical hernia repair at time of nephrectomy  . Right kidney removal     . Ivc filter placement ([facility] hx)  09/17/2014   . Transurethral resection of bladder tumor N/A 12/24/2014    Procedure: TRANSURETHRAL RESECTION OF BLADDER TUMOR (TURBT);  Surgeon: Ardis Hughs, MD;  Location: [facility];  Service: Urology;  Laterality: N/A;    Medications:  HOME MEDS: Prior to Admission medications   Medication Sig Start Date End Date Taking? Authorizing Provider  aspirin 81 MG chewable tablet Chew 1 tablet (81 mg total) by mouth daily. 08/20/14  Yes Lezlie Octave Black, NP  donepezil (ARICEPT) 10 MG tablet Take 10 mg by mouth at bedtime.   Yes Historical Provider, MD  escitalopram (LEXAPRO) 20 MG tablet Take 20 mg by mouth every morning.    Yes Historical Provider, MD   fludrocortisone (FLORINEF) 0.1 MG tablet Take 0.2 mg by mouth every morning.    Yes Historical Provider, MD  levothyroxine (SYNTHROID, LEVOTHROID) 125 MCG tablet Take 125 mcg by mouth daily before breakfast.   Yes Historical Provider, MD  midodrine (PROAMATINE) 10 MG tablet Take 10 mg by mouth 2 (two) times daily.   Yes Historical Provider, MD  mirabegron ER (MYRBETRIQ) 50 MG TB24 tablet Take 1 tablet (50 mg total) by mouth every morning. 12/25/14  Yes Ardis Hughs, MD  pantoprazole (PROTONIX) 40 MG tablet TAKE ONE TABLET EVERY MORNING 04/06/14  Yes Butch Penny, NP  polyethylene glycol (MIRALAX / GLYCOLAX) packet Take 17 g by mouth at bedtime.   Yes Historical Provider, MD  potassium chloride SA (K-DUR,KLOR-CON) 20 MEQ tablet Take 20 mEq by mouth every morning.    Yes Historical Provider, MD  torsemide (DEMADEX) 20 MG tablet Take 20-40 mg daily as needed for leg swelling 10/21/14  Yes Herminio Commons, MD  ziprasidone (GEODON) 20 MG capsule Take 20 mg by mouth every morning.  09/15/14  Yes Historical Provider, MD  opium-belladonna (B&O SUPPRETTES) 16.2-60 MG suppository Place 1 suppository rectally every 8 (eight) hours as needed for bladder spasms. 12/25/14   Ardis Hughs, MD     Allergies:  Allergies  Allergen Reactions  . Lorazepam     Hallucinations, worsens agitation  . Statins Other (See Comments)    MUSCLE ACHES    Social History:   reports that he quit smoking about 55 years ago. His smoking use included Cigarettes. He started smoking about 62 years ago. He has a 4 pack-year smoking history. He quit smokeless tobacco use about 55 years ago. His smokeless tobacco use included Chew. He reports that he does not drink alcohol or use illicit drugs.  Family History: Family History  Problem Relation Age of Onset  . Cancer - Lung Brother   . Heart disease Mother   . Heart attack Mother      Physical Exam: Filed Vitals:   01/06/15 0652 01/06/15 1400 01/06/15 2154  01/07/15 0604  BP: 134/81 122/74 138/88 125/82  Pulse: 78 77 76 72  Temp: 98 F (36.7 C) 98 F (36.7 C) 97.3 F (36.3 C) 97.9 F (36.6 C)  TempSrc: Oral  Oral Oral  Resp: 14 18 18 18   Height:      Weight:      SpO2: 97% 93% 97% 94%   Blood pressure 125/82, pulse 72, temperature 97.9 F (36.6 C), temperature  source Oral, resp. rate 18, height 5\' 5"  (1.651 m), weight 105.4 kg (232 lb 5.8 oz), SpO2 94 %.  GEN:  Pleasant patient lying in the stretcher in no acute distress; cooperative with exam. PSYCH:  alert and oriented x4; does not appear anxious or depressed; affect is appropriate. HEENT: Mucous membranes pink and anicteric; PERRLA; EOM intact; no cervical lymphadenopathy nor thyromegaly or carotid bruit; no JVD; There were no stridor. Neck is very supple. Breasts:: Not examined CHEST WALL: No tenderness CHEST: Normal respiration, clear to auscultation bilaterally.  HEART: Regular rate and rhythm.  There are no murmur, rub, or gallops.   BACK: No kyphosis or scoliosis; no CVA tenderness ABDOMEN: soft and non-tender; no masses, no organomegaly, normal abdominal bowel sounds; no pannus; no intertriginous candida. There is no rebound and no distention. Rectal Exam: Not done EXTREMITIES: No bone or joint deformity; age-appropriate arthropathy of the hands and knees; no edema; no ulcerations.  There is no calf tenderness. Genitalia: not examined PULSES: 2+ and symmetric SKIN: Normal hydration no rash or ulceration CNS: Cranial nerves 2-12 grossly intact no focal lateralizing neurologic deficit.  Speech is fluent; uvula elevated with phonation, facial symmetry and tongue midline. DTR are normal bilaterally, cerebella exam is intact, barbinski is negative and strengths are equaled bilaterally.  No sensory loss.   Labs on Admission:  Basic Metabolic Panel:  Recent Labs Lab 01/04/15 2140 01/05/15 0540 01/06/15 0634 01/07/15 1026  NA 143 144 143 141  K 3.5 2.9* 3.2* 3.5  CL 100* 99*  99* 102  CO2 33* 36* 34* 29  GLUCOSE 121* 104* 91 138*  BUN 33* 32* 28* 32*  CREATININE 2.06* 1.97* 1.82* 1.90*  CALCIUM 8.4* 8.4* 8.7* 8.5*  MG  --   --   --  2.2   Liver Function Tests:  Recent Labs Lab 01/05/15 0540  AST 29  ALT 19  ALKPHOS 69  BILITOT 1.2  PROT 6.6  ALBUMIN 3.6   No results for input(s): LIPASE, AMYLASE in the last 168 hours. No results for input(s): AMMONIA in the last 168 hours. CBC:  Recent Labs Lab 01/04/15 2140 01/05/15 0540  WBC 11.5* 10.0  NEUTROABS 9.0*  --   HGB 9.6* 9.6*  HCT 31.6* 31.9*  MCV 79.4 79.4  PLT 175 179   Cardiac Enzymes:  Recent Labs Lab 01/04/15 2140 01/05/15 0054 01/05/15 0540 01/05/15 1316  TROPONINI 0.05* 0.05* 0.05* 0.05*    CBG: No results for input(s): GLUCAP in the last 168 hours.   Radiological Exams on Admission: Dg Chest Port 1 View  01/06/2015   CLINICAL DATA:  Shortness breath, CHF, recent bladder surgery, history of bladder cancer and prostate cancer  EXAM: PORTABLE CHEST 1 VIEW  COMPARISON:  01/04/2015  FINDINGS: Cardiomegaly with pulmonary vascular congestion. No frank interstitial edema.  Small right pleural effusion. Left lung base is partially excluded. No pneumothorax.  Postsurgical changes related to prior CABG.  IMPRESSION: Cardiomegaly pulmonary vascular congestion. No frank interstitial edema.  Small right pleural effusion.   Electronically Signed   By: Julian Hy M.D.   On: 01/06/2015 10:19    Assessment/Plan Present on Admission:  . Prolonged Q-T interval on ECG . Elevated troponin . DVT (deep venous thrombosis) . Dementia without behavioral disturbance . CKD (chronic kidney disease) stage 3, GFR 30-59 ml/min   PLAN:  Acute on chronic combined CHF: Being duresed more as he is compensated, but is volume overloaded. Cardiology following and making recommendations. Home when [facility] with card.  Hypotension: Florinef being held. Midodrine being continued.  Stable.  Hypokalemia: In the setting of CKD. Will continue to replete.  K is ok today.   Dysphagia: I spoke with Dr Dereck Leep, and he will see him in follow up outpatient.  TURBT: Has follow up appointment with urology.  Dr Louis Meckel recommended to leave foley in.   Other plans as per orders.  Code Status:  FULL Haskel Khan, MD. [facility] Pager [phone] 7pm to 7am.  01/07/2015, 3:38 PM

## 2015-01-07 NOTE — Progress Notes (Addendum)
Subjective:    No SOB  Objective:   Temp:  [97.3 F (36.3 C)-98 F (36.7 C)] 97.9 F (36.6 C) (09/29 0604) Pulse Rate:  [72-77] 72 (09/29 0604) Resp:  [18] 18 (09/29 0604) BP: (122-138)/(74-88) 125/82 mmHg (09/29 0604) SpO2:  [93 %-97 %] 94 % (09/29 0604) Last BM Date: 01/03/15  Filed Weights   01/04/15 2221 01/05/15 0050 01/05/15 0500  Weight: 242 lb 8 oz (109.997 kg) 235 lb 0.2 oz (106.6 kg) 232 lb 5.8 oz (105.4 kg)    Intake/Output Summary (Last 24 hours) at 01/07/15 0902 Last data filed at 01/07/15 0604  Gross per 24 hour  Intake    480 ml  Output   1850 ml  Net  -1370 ml    Telemetry: SR. Short run of PSVT Exam:  General: NAD  Resp: CTAB  Cardiac: RRR, no m/rg, no jvd  GI: abdomen soft, Nt, ND  MSK: no LE edema  Neuro: no focal deficits  Psych: appropriate affect  Lab Results:  Basic Metabolic Panel:  Recent Labs Lab 01/04/15 2140 01/05/15 0540 01/06/15 0634  NA 143 144 143  K 3.5 2.9* 3.2*  CL 100* 99* 99*  CO2 33* 36* 34*  GLUCOSE 121* 104* 91  BUN 33* 32* 28*  CREATININE 2.06* 1.97* 1.82*  CALCIUM 8.4* 8.4* 8.7*    Liver Function Tests:  Recent Labs Lab 01/05/15 0540  AST 29  ALT 19  ALKPHOS 69  BILITOT 1.2  PROT 6.6  ALBUMIN 3.6    CBC:  Recent Labs Lab 01/04/15 2140 01/05/15 0540  WBC 11.5* 10.0  HGB 9.6* 9.6*  HCT 31.6* 31.9*  MCV 79.4 79.4  PLT 175 179    Cardiac Enzymes:  Recent Labs Lab 01/05/15 0054 01/05/15 0540 01/05/15 1316  TROPONINI 0.05* 0.05* 0.05*    BNP: No results for input(s): PROBNP in the last 8760 hours.  Coagulation: No results for input(s): INR in the last 168 hours.  ECG:   Medications:   Scheduled Medications: . aspirin  81 mg Oral Daily  . donepezil  10 mg Oral QHS  . enoxaparin (LOVENOX) injection  40 mg Subcutaneous Q24H  . escitalopram  20 mg Oral q morning - 10a  . furosemide  40 mg Intravenous Q12H  . levothyroxine  125 mcg Oral QAC breakfast  . midodrine   10 mg Oral BID  .  morphine injection  1 mg Intravenous Once  . pantoprazole  40 mg Oral q morning - 10a  . potassium chloride SA  20 mEq Oral q morning - 10a  . sodium chloride  3 mL Intravenous Q12H  . ziprasidone  20 mg Oral q morning - 10a     Infusions:     PRN Medications:  sodium chloride, acetaminophen **OR** acetaminophen, haloperidol, ondansetron **OR** ondansetron (ZOFRAN) IV, sodium chloride     Assessment/Plan    1. Acute on chronic combined systolic/diastolic HF - echo 12/929 with LVEF 40-45%, abnormal diastolic function, mod to severe RV dysfunction - negative 1.1 liters yesterday, negative 7.2 liters since admission. He is on lasix 40mg  IV bid. Labs are pending this morning. Will change to torsemide 20mg  daily (starting tomorrow, he received one dose of IV lasix 40mg  x1 this AM). Off florinef he likely will not require as much home diuretic.   2. Chronic hypotension - florinef on hold during diuresis, midodrine has been continued. BP's have been stable. Will check orthostatic vitals, with his issues with fluid retention would  try to stay away from florinef if possible. If needed he has room to uptitrate his midodrine.  - consider home compression stockings.   3.Hypokalemia - replacement per primary team - labs pending today.        Carlyle Dolly, M.D.,

## 2015-01-08 LAB — BASIC METABOLIC PANEL
Anion gap: 8 (ref 5–15)
BUN: 34 mg/dL — AB (ref 6–20)
CALCIUM: 8.8 mg/dL — AB (ref 8.9–10.3)
CHLORIDE: 102 mmol/L (ref 101–111)
CO2: 31 mmol/L (ref 22–32)
CREATININE: 2.01 mg/dL — AB (ref 0.61–1.24)
GFR, EST AFRICAN AMERICAN: 35 mL/min — AB (ref >60)
GFR, EST NON AFRICAN AMERICAN: 30 mL/min — AB (ref >60)
Glucose, Bld: 90 mg/dL (ref 65–99)
Potassium: 3.6 mmol/L (ref 3.5–5.1)
SODIUM: 141 mmol/L (ref 135–145)

## 2015-01-08 MED ORDER — TORSEMIDE 20 MG PO TABS: 20.0000 mg | ORAL_TABLET | Freq: Every day | ORAL | Status: DC

## 2015-01-08 NOTE — Care Management Note (Addendum)
Case Management Note  Patient Details  Name: [name] [name] MRN: [id number] Date of Birth: [birth year]  Subjective/Objective:                    Action/Plan:   Expected Discharge Date:  01/09/15               Expected Discharge Plan:  [place]  In-House Referral:  NA  Discharge planning Services  CM Consult  Post Acute Care Choice:  Home Health Choice offered to:  Patient  DME Arranged:    DME Agency:     HH Arranged:  RN, PT [place] Agency:  [place]  Status of Service:  Completed, signed off  Medicare Important Message Given:  Yes-second notification given Date Medicare IM Given:    Medicare IM give by:    Date Additional Medicare IM Given:    Additional Medicare Important Message give by:     If discussed at [place] of Stay Meetings, dates discussed:    Additional Comments: Pt discharged home today with [facility] RN and PT (per family/pt request). Romualdo Bolk of [facility] is aware and will collect the pts information from the chart. [facility] services to start within 48 hours. No new DME needs noted. Pt and pts nurse aware of discharge arrangements. Christinia Gully [place], RN 01/08/2015, 12:37 PM

## 2015-01-08 NOTE — Progress Notes (Signed)
Patient with orders to be discharged home. Discharge instructions given to patient, caregiver, and daughter-in-law, verbalized understanding. Heart failure book given and reviewed with caregiver. Patient discharged with indwelling catheter per MD order. Catheter care education given. Patient stable. Patient left in private vehicle with family.

## 2015-01-08 NOTE — Discharge Summary (Signed)
Physician Discharge Summary  [name] [name] UTM:[id number] DOB: 03/14/1934 DOA: 01/04/2015  PCP: Purvis Kilts, MD  Admit date: 01/04/2015 Discharge date: 01/08/2015  Time spent: 35 minutes  Recommendations for Outpatient Follow-up:  1. Follow up with Cardiology as scheduled. 2. Follow up with GI so Dr Dereck Leep can evaluate your swallowing. 3. Follow up with Dr Louis Meckel for your bladder.  You had an appointment with him.   Discharge Diagnoses:  Active Problems:   S/P CABG x 4   Dementia without behavioral disturbance   Prolonged Q-T interval on ECG   DM type 2 (diabetes mellitus, type 2)   Elevated troponin   CKD (chronic kidney disease) stage 3, GFR 30-59 ml/min   DVT (deep venous thrombosis)   Acute CHF   Acute exacerbation of CHF (congestive heart failure)   Acute on chronic combined systolic and diastolic congestive heart failure   Acute diastolic congestive heart failure   Discharge Condition: improved.   Diet recommendation: Cardiac diet.   Filed Weights   01/04/15 2221 01/05/15 0050 01/05/15 0500  Weight: 109.997 kg (242 lb 8 oz) 106.6 kg (235 lb 0.2 oz) 105.4 kg (232 lb 5.8 oz)    History of present illness: patient was admitted into the hospital on Sept 27, 2016 by Dr Frederich Chick for CHF and SOB.  As per his H and P:  " 81 year old male who  has a past medical history of Chronic constipation; GERD (gastroesophageal reflux disease); Hyperlipidemia; Orthostatic hypotension (11/19/2013); Coronary artery disease; Mild dementia; Hypothyroidism; History of esophageal dilatation; History of gastritis; History of hiatal hernia; History of bladder cancer; Kidney tumor; BPH (benign prostatic hyperplasia); CKD (chronic kidney disease) stage 3, GFR 30-59 ml/min; History of kidney stones; ED (erectile dysfunction); S/P CABG x 4; Bladder tumor; DVT (deep venous thrombosis); Myocardial infarction (12/2013); Stroke; Chronic combined systolic and diastolic CHF (congestive heart failure);  Depression; Hematuria; Heme positive stool; Anemia; Shortness of breath dyspnea; Type 2 diabetes mellitus; Anxiety; Prostate cancer; and OSA on CPAP. Today was brought to the hospital with chief complaints of shortness of breath which has been gradually getting worse since bladder surgery on 15th September. Patient underwent transurethral resection of bladder tumor on 12/24/2014. Since then patient has been gradually getting weaker not eating and drinking, not sleeping. Patient does have a history of dementia and gets hallucinations which has been getting worse. Also patient has been getting short of breath on walking. Patient is unable to provide significant history, as per daughter-in-law he denied chest pain. No nausea vomiting or diarrhea. Patient was supposed to get torsemide once or twice daily which they have been giving him but despite torsemide his breathing has been getting worse. In the ED chest x-ray showed bilateral pleural effusions and perivascular haziness concerning for pulmonary edema, one dose of Lasix 80 kg IV 1 given the ED. Patient also found to have mild elevation of troponin 0.05   Hospital Course:  Patient was admitted into the hospital, and he was diuresed.  He was seen in consultation with cardiology and Dr Harl Bowie has made diuretic dosing recommendations.  He continued to diurese, and felt markedly better.  His troponins were elevated, but remained on the low ranges around 0.06.  He did have hypotension, and his florinef was held, and his midodrine was continued.  He also was suggested compression stocking for his orthostatic symptomology and chronic hypotension.   With his hypokalemia, he was repleted.   He complained to me that he still has dysphagia, but no  odynophagia.  He had seen Dr Dereck Leep and had several esosphageal dilatations.  I spoke with Dr Dereck Leep, and he would like to see [name] outpatient follow up, to re evaluate if he would benenefit having more dilatation, or  stent placement.  Meanwhile, will continue his PPI and treat his GERD.  He also had bladder cancer, and had TURBT about 2 weeks ago.  I spoke with his urologist Dr Louis Meckel, and he recommneded to keep his indwelling foley, and to see him in follow up in about 10 days.  He had already had an appointment with Dr Louis Meckel.   He is now stable for discharge, and will be discharged today.  Dr Harl Bowie recommneded no diuretic today, but tomorrow, he should begin his Demedex as scheduled.   Thank you for allowing me to participate in the care of his nice patient.   Good Day.   Discharge Instructions    Diet - low sodium heart healthy    Complete by:  As directed      Discharge instructions    Complete by:  As directed   Restart your Demadex tomorrow as instructed by Dr Harl Bowie.  Follow up with your cardiology clinic appointment as scheduled.  You will need to see Dr Dereck Leep about your swallowing, and you should see Dr Louis Meckel for your foley catheter in about 10 days as scheduled.     Increase activity slowly    Complete by:  As directed           Current Discharge Medication List    CONTINUE these medications which have NOT CHANGED   Details  aspirin 81 MG chewable tablet Chew 1 tablet (81 mg total) by mouth daily.    donepezil (ARICEPT) 10 MG tablet Take 10 mg by mouth at bedtime.    escitalopram (LEXAPRO) 20 MG tablet Take 20 mg by mouth every morning.     levothyroxine (SYNTHROID, LEVOTHROID) 125 MCG tablet Take 125 mcg by mouth daily before breakfast.    midodrine (PROAMATINE) 10 MG tablet Take 10 mg by mouth 2 (two) times daily.    mirabegron ER (MYRBETRIQ) 50 MG TB24 tablet Take 1 tablet (50 mg total) by mouth every morning. Qty: 30 tablet, Refills: 5    pantoprazole (PROTONIX) 40 MG tablet TAKE ONE TABLET EVERY MORNING Qty: 30 tablet, Refills: 4    polyethylene glycol (MIRALAX / GLYCOLAX) packet Take 17 g by mouth at bedtime.    potassium chloride SA (K-DUR,KLOR-CON) 20 MEQ tablet Take 20  mEq by mouth every morning.     torsemide (DEMADEX) 20 MG tablet Take 20-40 mg daily as needed for leg swelling Qty: 180 tablet, Refills: 3    ziprasidone (GEODON) 20 MG capsule Take 20 mg by mouth every morning.       STOP taking these medications     fludrocortisone (FLORINEF) 0.1 MG tablet      opium-belladonna (B&O SUPPRETTES) 16.2-60 MG suppository        Allergies  Allergen Reactions  . Lorazepam     Hallucinations, worsens agitation  . Statins Other (See Comments)    MUSCLE ACHES      The results of significant diagnostics from this hospitalization (including imaging, microbiology, ancillary and laboratory) are listed below for reference.    Significant Diagnostic Studies: Dg Chest Port 1 View  01/06/2015   CLINICAL DATA:  Shortness breath, CHF, recent bladder surgery, history of bladder cancer and prostate cancer  EXAM: PORTABLE CHEST 1 VIEW  COMPARISON:  01/04/2015  FINDINGS: Cardiomegaly with pulmonary vascular congestion. No frank interstitial edema.  Small right pleural effusion. Left lung base is partially excluded. No pneumothorax.  Postsurgical changes related to prior CABG.  IMPRESSION: Cardiomegaly pulmonary vascular congestion. No frank interstitial edema.  Small right pleural effusion.   Electronically Signed   By: Julian Hy M.D.   On: 01/06/2015 10:19   Dg Chest Portable 1 View  01/04/2015   CLINICAL DATA:  Shortness of breath all day.  EXAM: PORTABLE CHEST 1 VIEW  COMPARISON:  09/18/2014  FINDINGS: Patient is post median sternotomy. Lung volumes are low. Hazy opacity at the lung bases, likely pleural effusions. Perivascular haziness concerning for pulmonary edema. No pneumothorax or confluent airspace disease. Cardiomediastinal contours are unchanged allowing for differences in technique.  IMPRESSION: Hypoventilatory chest with bilateral pleural effusions and perivascular haziness, concerning for pulmonary edema. Findings suggest mild CHF.    Electronically Signed   By: Jeb Levering M.D.   On: 01/04/2015 21:19    Microbiology: Recent Results (from the past 240 hour(s))  MRSA PCR Screening     Status: None   Collection Time: 01/05/15 12:55 AM  Result Value Ref Range Status   MRSA by PCR NEGATIVE NEGATIVE Final    Comment:        The GeneXpert MRSA Assay (FDA approved for NASAL specimens only), is one component of a comprehensive MRSA colonization surveillance program. It is not intended to diagnose MRSA infection nor to guide or monitor treatment for MRSA infections.      Labs: Basic Metabolic Panel:  Recent Labs Lab 01/04/15 2140 01/05/15 0540 01/06/15 0634 01/07/15 1026 01/08/15 0502  NA 143 144 143 141 141  K 3.5 2.9* 3.2* 3.5 3.6  CL 100* 99* 99* 102 102  CO2 33* 36* 34* 29 31  GLUCOSE 121* 104* 91 138* 90  BUN 33* 32* 28* 32* 34*  CREATININE 2.06* 1.97* 1.82* 1.90* 2.01*  CALCIUM 8.4* 8.4* 8.7* 8.5* 8.8*  MG  --   --   --  2.2  --    Liver Function Tests:  Recent Labs Lab 01/05/15 0540  AST 29  ALT 19  ALKPHOS 69  BILITOT 1.2  PROT 6.6  ALBUMIN 3.6   CBC:  Recent Labs Lab 01/04/15 2140 01/05/15 0540  WBC 11.5* 10.0  NEUTROABS 9.0*  --   HGB 9.6* 9.6*  HCT 31.6* 31.9*  MCV 79.4 79.4  PLT 175 179   Cardiac Enzymes:  Recent Labs Lab 01/04/15 2140 01/05/15 0054 01/05/15 0540 01/05/15 1316  TROPONINI 0.05* 0.05* 0.05* 0.05*   BNP: BNP (last 3 results)  Recent Labs  08/17/14 0900 09/18/14 0130 01/04/15 2140  BNP 2588.0* 2240.0* 2464.0*     Signed:  LE,PETER  [facility] 01/08/2015, 11:19 AM

## 2015-01-08 NOTE — Care Management Important Message (Signed)
Important Message  Patient Details  Name: DIOR STEPTER MRN: [id number] Date of Birth: [birth year]   Medicare Important Message Given:  Yes-second notification given    Joylene Draft, RN 01/08/2015, 11:38 AM

## 2015-01-08 NOTE — Evaluation (Signed)
Physical Therapy Evaluation Patient Details Name: [name] [name] MRN: [id number] DOB: Aug 29, 1933 Today's Date: 01/08/2015   History of Present Illness  Pt is a 81 year old male with multiple health problems admitted with exacerbation of CHF.  He has dementia at baseline and has 24 hour care at home.  Pt underwent TURP on 12-24-14 and since that time has had increasing weakness and waning appetitte.  Clinical Impression   Pt was seen for evaluation.  He was found to be lethargic but otherwise no c/o.  His full time CG was present during evaluation.  He was found to be mildly deconditioned from his baseline.  Because of decreased standing balance he needs hand held support so was instructed in the use of a walker for gait.  He needed frequent cues to stay closer to the walker.  He should definitely be able to transition to home at d/c with full time supervision.  I have recommended HHPT initially upon d/c.    Follow Up Recommendations Home health PT    Equipment Recommendations  None recommended by PT    Recommendations for Other Services   none    Precautions / Restrictions Precautions Precautions: Fall Restrictions Weight Bearing Restrictions: No      Mobility  Bed Mobility Overal bed mobility: Needs Assistance Bed Mobility: Sidelying to Sit   Sidelying to sit: Min guard       General bed mobility comments: pt had some difficulty figuring out the technique for transferring to EOB  Transfers Overall transfer level: Needs assistance Equipment used: Rolling walker (2 wheeled) Transfers: Sit to/from Stand Sit to Stand: Supervision         General transfer comment: pt needed a walker for stability upon standing  Ambulation/Gait Ambulation/Gait assistance: Supervision Ambulation Distance (Feet): 75 Feet Assistive device: Rolling walker (2 wheeled) Gait Pattern/deviations: Trunk flexed;Narrow base of support   Gait velocity interpretation: >2.62 ft/sec, indicative of  independent community ambulator General Gait Details: frequent cues needed to stay closer to the walker  Stairs            Wheelchair Mobility    Modified Rankin (Stroke Patients Only)       Balance Overall balance assessment: Needs assistance Sitting-balance support: No upper extremity supported;Feet supported Sitting balance-Leahy Scale: Good     Standing balance support: No upper extremity supported Standing balance-Leahy Scale: Fair                               Pertinent Vitals/Pain Pain Assessment: No/denies pain    Home Living Family/patient expects to be discharged to:: Private residence Living Arrangements: Alone Available Help at Discharge: Personal care attendant;Available 24 hours/day Type of Home: House Home Access: Stairs to enter Entrance Stairs-Rails: None Entrance Stairs-Number of Steps: 1 Home Layout: One level;Laundry or work area in [place]: Kasandra Knudsen - single point;Wheelchair - [employer] - 2 wheels;Tub bench      Prior Function Level of Independence: Needs assistance   Gait / Transfers Assistance Needed: pt usually "furniture walks" but sometimes uses a walker for gait  ADL's / Homemaking Assistance Needed: assist with all personal ADLs and household ADLs        Hand Dominance        Extremity/Trunk Assessment               Lower Extremity Assessment: Overall WFL for tasks assessed      Cervical / Trunk Assessment: Kyphotic  Communication   Communication: No difficulties  Cognition Arousal/Alertness: Lethargic Behavior During Therapy: WFL for tasks assessed/performed Overall Cognitive Status: History of cognitive impairments - at baseline                      General Comments      Exercises        Assessment/Plan    PT Assessment All further PT needs can be met in the next venue of care  PT Diagnosis     PT Problem List Decreased activity tolerance;Decreased  balance;Decreased mobility;Cardiopulmonary status limiting activity;Obesity  PT Treatment Interventions     PT Goals (Current goals can be found in the Care Plan section) Acute Rehab PT Goals PT Goal Formulation: All assessment and education complete, DC therapy    Frequency     Barriers to discharge        Co-evaluation               End of Session Equipment Utilized During Treatment: Gait belt Activity Tolerance: Patient tolerated treatment well Patient left: in chair;with call bell/phone within reach;with family/visitor present Nurse Communication: Mobility status         Time: 1137-1208 PT Time Calculation (min) (ACUTE ONLY): 31 min   Charges:   PT Evaluation $Initial PT Evaluation Tier I: 1 Procedure     PT G CodesSable Feil  PT 01/08/2015, 12:23 PM [phone]

## 2015-01-08 NOTE — Progress Notes (Signed)
Patient ID: SHAMAN MUSCARELLA, male   DOB: 11-06-1933, 81 y.o.   MRN: [id number]    Subjective:    Denies any SOB  Objective:   Temp:  [98 F (36.7 C)-98.7 F (37.1 C)] 98 F (36.7 C) (09/30 0556) Pulse Rate:  [68-80] 68 (09/30 0556) Resp:  [18] 18 (09/30 0556) BP: (119-129)/(44-78) 119/44 mmHg (09/30 0556) SpO2:  [96 %-99 %] 97 % (09/30 0556) Last BM Date: 01/03/15  Filed Weights   01/04/15 2221 01/05/15 0050 01/05/15 0500  Weight: 242 lb 8 oz (109.997 kg) 235 lb 0.2 oz (106.6 kg) 232 lb 5.8 oz (105.4 kg)    Intake/Output Summary (Last 24 hours) at 01/08/15 0828 Last data filed at 01/07/15 1300  Gross per 24 hour  Intake    480 ml  Output      0 ml  Net    480 ml    Telemetry: NSR  Exam:  General: NAD   Resp: CTAB  Cardiac: RRR, no m/r/g, no jvd  GI: abdomen soft, NT, ND  MSK: trace bilateral edema  Neuro: no focal deficits  Psych: appropriate affect Lab Results:  Basic Metabolic Panel:  Recent Labs Lab 01/06/15 0634 01/07/15 1026 01/08/15 0502  NA 143 141 141  K 3.2* 3.5 3.6  CL 99* 102 102  CO2 34* 29 31  GLUCOSE 91 138* 90  BUN 28* 32* 34*  CREATININE 1.82* 1.90* 2.01*  CALCIUM 8.7* 8.5* 8.8*  MG  --  2.2  --     Liver Function Tests:  Recent Labs Lab 01/05/15 0540  AST 29  ALT 19  ALKPHOS 69  BILITOT 1.2  PROT 6.6  ALBUMIN 3.6    CBC:  Recent Labs Lab 01/04/15 2140 01/05/15 0540  WBC 11.5* 10.0  HGB 9.6* 9.6*  HCT 31.6* 31.9*  MCV 79.4 79.4  PLT 175 179    Cardiac Enzymes:  Recent Labs Lab 01/05/15 0054 01/05/15 0540 01/05/15 1316  TROPONINI 0.05* 0.05* 0.05*    BNP: No results for input(s): PROBNP in the last 8760 hours.  Coagulation: No results for input(s): INR in the last 168 hours.  ECG:   Medications:   Scheduled Medications: . aspirin  81 mg Oral Daily  . donepezil  10 mg Oral QHS  . enoxaparin (LOVENOX) injection  40 mg Subcutaneous Q24H  . escitalopram  20 mg Oral q morning - 10a  .  levothyroxine  125 mcg Oral QAC breakfast  . midodrine  10 mg Oral BID  .  morphine injection  1 mg Intravenous Once  . pantoprazole  40 mg Oral q morning - 10a  . potassium chloride SA  20 mEq Oral q morning - 10a  . sodium chloride  3 mL Intravenous Q12H  . torsemide  20 mg Oral Daily  . ziprasidone  20 mg Oral q morning - 10a     Infusions:     PRN Medications:  sodium chloride, acetaminophen **OR** acetaminophen, haloperidol, ondansetron **OR** ondansetron (ZOFRAN) IV, sodium chloride     Assessment/Plan    1. Acute on chronic combined systolic/diastolic HF - echo 05/3534 with LVEF 40-45%, abnormal diastolic function, mod to severe RV dysfunction - uop not documented yesterday, negative around 7.2 liters since admission. Uptrend in Cr and BUN, will hold diuretics today. Start oral torsemide 20mg  daily tomorrow.  - Off florinef he likely will not require as much home diuretic.  - after discharge will need f/u in 1 week with NP Lawerence in cardiology  clinic, repeat labs will need to be ordered at that [facility].   2. Chronic hypotension - florinef on hold during diuresis, midodrine has been continued. BP's have been stable while supine. Orthostatics were also negative yesterday  - would not restart midodrine due to his issues with CHF and fluid overload. Continue midodrine at current dose, in the future if needed room to titrate up midodrine.  - consider home compression stockings.   3.Hypokalemia - replacement per primary team - labs pending today.     Will sign off inpatient care. From cardiac standpoint patient is ok for discharge. Needs f/u next week with NP Lawerence, will need repeat labs at that time as well.    Carlyle Dolly, M.D.

## 2015-01-10 DIAGNOSIS — Z8673 Personal history of transient ischemic attack (TIA), and cerebral infarction without residual deficits: Secondary | ICD-10-CM | POA: Diagnosis not present

## 2015-01-10 DIAGNOSIS — Z85528 Personal history of other malignant neoplasm of kidney: Secondary | ICD-10-CM | POA: Diagnosis not present

## 2015-01-10 DIAGNOSIS — F419 Anxiety disorder, unspecified: Secondary | ICD-10-CM | POA: Diagnosis not present

## 2015-01-10 DIAGNOSIS — Z8546 Personal history of malignant neoplasm of prostate: Secondary | ICD-10-CM | POA: Diagnosis not present

## 2015-01-10 DIAGNOSIS — K219 Gastro-esophageal reflux disease without esophagitis: Secondary | ICD-10-CM | POA: Diagnosis not present

## 2015-01-10 DIAGNOSIS — E1122 Type 2 diabetes mellitus with diabetic chronic kidney disease: Secondary | ICD-10-CM | POA: Diagnosis not present

## 2015-01-10 DIAGNOSIS — I82409 Acute embolism and thrombosis of unspecified deep veins of unspecified lower extremity: Secondary | ICD-10-CM | POA: Diagnosis not present

## 2015-01-10 DIAGNOSIS — G4733 Obstructive sleep apnea (adult) (pediatric): Secondary | ICD-10-CM | POA: Diagnosis not present

## 2015-01-10 DIAGNOSIS — Z8551 Personal history of malignant neoplasm of bladder: Secondary | ICD-10-CM | POA: Diagnosis not present

## 2015-01-10 DIAGNOSIS — N183 Chronic kidney disease, stage 3 (moderate): Secondary | ICD-10-CM | POA: Diagnosis not present

## 2015-01-10 DIAGNOSIS — I2510 Atherosclerotic heart disease of native coronary artery without angina pectoris: Secondary | ICD-10-CM | POA: Diagnosis not present

## 2015-01-10 DIAGNOSIS — I5043 Acute on chronic combined systolic (congestive) and diastolic (congestive) heart failure: Secondary | ICD-10-CM | POA: Diagnosis not present

## 2015-01-10 DIAGNOSIS — F0390 Unspecified dementia without behavioral disturbance: Secondary | ICD-10-CM | POA: Diagnosis not present

## 2015-01-11 DIAGNOSIS — E1122 Type 2 diabetes mellitus with diabetic chronic kidney disease: Secondary | ICD-10-CM | POA: Diagnosis not present

## 2015-01-11 DIAGNOSIS — Z8551 Personal history of malignant neoplasm of bladder: Secondary | ICD-10-CM | POA: Diagnosis not present

## 2015-01-11 DIAGNOSIS — I2510 Atherosclerotic heart disease of native coronary artery without angina pectoris: Secondary | ICD-10-CM | POA: Diagnosis not present

## 2015-01-11 DIAGNOSIS — Z85528 Personal history of other malignant neoplasm of kidney: Secondary | ICD-10-CM | POA: Diagnosis not present

## 2015-01-11 DIAGNOSIS — Z8546 Personal history of malignant neoplasm of prostate: Secondary | ICD-10-CM | POA: Diagnosis not present

## 2015-01-11 DIAGNOSIS — F419 Anxiety disorder, unspecified: Secondary | ICD-10-CM | POA: Diagnosis not present

## 2015-01-11 DIAGNOSIS — Z8673 Personal history of transient ischemic attack (TIA), and cerebral infarction without residual deficits: Secondary | ICD-10-CM | POA: Diagnosis not present

## 2015-01-11 DIAGNOSIS — G4733 Obstructive sleep apnea (adult) (pediatric): Secondary | ICD-10-CM | POA: Diagnosis not present

## 2015-01-11 DIAGNOSIS — K219 Gastro-esophageal reflux disease without esophagitis: Secondary | ICD-10-CM | POA: Diagnosis not present

## 2015-01-11 DIAGNOSIS — F0390 Unspecified dementia without behavioral disturbance: Secondary | ICD-10-CM | POA: Diagnosis not present

## 2015-01-11 DIAGNOSIS — I82409 Acute embolism and thrombosis of unspecified deep veins of unspecified lower extremity: Secondary | ICD-10-CM | POA: Diagnosis not present

## 2015-01-11 DIAGNOSIS — N183 Chronic kidney disease, stage 3 (moderate): Secondary | ICD-10-CM | POA: Diagnosis not present

## 2015-01-11 DIAGNOSIS — I5043 Acute on chronic combined systolic (congestive) and diastolic (congestive) heart failure: Secondary | ICD-10-CM | POA: Diagnosis not present

## 2015-01-14 DIAGNOSIS — F0390 Unspecified dementia without behavioral disturbance: Secondary | ICD-10-CM | POA: Diagnosis not present

## 2015-01-14 DIAGNOSIS — Z85528 Personal history of other malignant neoplasm of kidney: Secondary | ICD-10-CM | POA: Diagnosis not present

## 2015-01-14 DIAGNOSIS — Z8546 Personal history of malignant neoplasm of prostate: Secondary | ICD-10-CM | POA: Diagnosis not present

## 2015-01-14 DIAGNOSIS — Z8673 Personal history of transient ischemic attack (TIA), and cerebral infarction without residual deficits: Secondary | ICD-10-CM | POA: Diagnosis not present

## 2015-01-14 DIAGNOSIS — Z8551 Personal history of malignant neoplasm of bladder: Secondary | ICD-10-CM | POA: Diagnosis not present

## 2015-01-14 DIAGNOSIS — K219 Gastro-esophageal reflux disease without esophagitis: Secondary | ICD-10-CM | POA: Diagnosis not present

## 2015-01-14 DIAGNOSIS — I82409 Acute embolism and thrombosis of unspecified deep veins of unspecified lower extremity: Secondary | ICD-10-CM | POA: Diagnosis not present

## 2015-01-14 DIAGNOSIS — I5043 Acute on chronic combined systolic (congestive) and diastolic (congestive) heart failure: Secondary | ICD-10-CM | POA: Diagnosis not present

## 2015-01-14 DIAGNOSIS — E1122 Type 2 diabetes mellitus with diabetic chronic kidney disease: Secondary | ICD-10-CM | POA: Diagnosis not present

## 2015-01-14 DIAGNOSIS — F419 Anxiety disorder, unspecified: Secondary | ICD-10-CM | POA: Diagnosis not present

## 2015-01-14 DIAGNOSIS — N183 Chronic kidney disease, stage 3 (moderate): Secondary | ICD-10-CM | POA: Diagnosis not present

## 2015-01-14 DIAGNOSIS — I2510 Atherosclerotic heart disease of native coronary artery without angina pectoris: Secondary | ICD-10-CM | POA: Diagnosis not present

## 2015-01-14 DIAGNOSIS — G4733 Obstructive sleep apnea (adult) (pediatric): Secondary | ICD-10-CM | POA: Diagnosis not present

## 2015-01-15 DIAGNOSIS — Z8551 Personal history of malignant neoplasm of bladder: Secondary | ICD-10-CM | POA: Diagnosis not present

## 2015-01-15 DIAGNOSIS — Z85528 Personal history of other malignant neoplasm of kidney: Secondary | ICD-10-CM | POA: Diagnosis not present

## 2015-01-15 DIAGNOSIS — G4733 Obstructive sleep apnea (adult) (pediatric): Secondary | ICD-10-CM | POA: Diagnosis not present

## 2015-01-15 DIAGNOSIS — F419 Anxiety disorder, unspecified: Secondary | ICD-10-CM | POA: Diagnosis not present

## 2015-01-15 DIAGNOSIS — Z8546 Personal history of malignant neoplasm of prostate: Secondary | ICD-10-CM | POA: Diagnosis not present

## 2015-01-15 DIAGNOSIS — Z8673 Personal history of transient ischemic attack (TIA), and cerebral infarction without residual deficits: Secondary | ICD-10-CM | POA: Diagnosis not present

## 2015-01-15 DIAGNOSIS — N183 Chronic kidney disease, stage 3 (moderate): Secondary | ICD-10-CM | POA: Diagnosis not present

## 2015-01-15 DIAGNOSIS — I5043 Acute on chronic combined systolic (congestive) and diastolic (congestive) heart failure: Secondary | ICD-10-CM | POA: Diagnosis not present

## 2015-01-15 DIAGNOSIS — K219 Gastro-esophageal reflux disease without esophagitis: Secondary | ICD-10-CM | POA: Diagnosis not present

## 2015-01-15 DIAGNOSIS — I2510 Atherosclerotic heart disease of native coronary artery without angina pectoris: Secondary | ICD-10-CM | POA: Diagnosis not present

## 2015-01-15 DIAGNOSIS — F0390 Unspecified dementia without behavioral disturbance: Secondary | ICD-10-CM | POA: Diagnosis not present

## 2015-01-15 DIAGNOSIS — I82409 Acute embolism and thrombosis of unspecified deep veins of unspecified lower extremity: Secondary | ICD-10-CM | POA: Diagnosis not present

## 2015-01-15 DIAGNOSIS — E1122 Type 2 diabetes mellitus with diabetic chronic kidney disease: Secondary | ICD-10-CM | POA: Diagnosis not present

## 2015-01-18 DIAGNOSIS — R338 Other retention of urine: Secondary | ICD-10-CM | POA: Diagnosis not present

## 2015-01-18 DIAGNOSIS — R350 Frequency of micturition: Secondary | ICD-10-CM | POA: Diagnosis not present

## 2015-01-18 DIAGNOSIS — C672 Malignant neoplasm of lateral wall of bladder: Secondary | ICD-10-CM | POA: Diagnosis not present

## 2015-01-19 DIAGNOSIS — I5043 Acute on chronic combined systolic (congestive) and diastolic (congestive) heart failure: Secondary | ICD-10-CM | POA: Diagnosis not present

## 2015-01-19 DIAGNOSIS — F0390 Unspecified dementia without behavioral disturbance: Secondary | ICD-10-CM | POA: Diagnosis not present

## 2015-01-19 DIAGNOSIS — Z8551 Personal history of malignant neoplasm of bladder: Secondary | ICD-10-CM | POA: Diagnosis not present

## 2015-01-19 DIAGNOSIS — K219 Gastro-esophageal reflux disease without esophagitis: Secondary | ICD-10-CM | POA: Diagnosis not present

## 2015-01-19 DIAGNOSIS — F419 Anxiety disorder, unspecified: Secondary | ICD-10-CM | POA: Diagnosis not present

## 2015-01-19 DIAGNOSIS — Z8673 Personal history of transient ischemic attack (TIA), and cerebral infarction without residual deficits: Secondary | ICD-10-CM | POA: Diagnosis not present

## 2015-01-19 DIAGNOSIS — G4733 Obstructive sleep apnea (adult) (pediatric): Secondary | ICD-10-CM | POA: Diagnosis not present

## 2015-01-19 DIAGNOSIS — I82409 Acute embolism and thrombosis of unspecified deep veins of unspecified lower extremity: Secondary | ICD-10-CM | POA: Diagnosis not present

## 2015-01-19 DIAGNOSIS — I2510 Atherosclerotic heart disease of native coronary artery without angina pectoris: Secondary | ICD-10-CM | POA: Diagnosis not present

## 2015-01-19 DIAGNOSIS — Z8546 Personal history of malignant neoplasm of prostate: Secondary | ICD-10-CM | POA: Diagnosis not present

## 2015-01-19 DIAGNOSIS — Z85528 Personal history of other malignant neoplasm of kidney: Secondary | ICD-10-CM | POA: Diagnosis not present

## 2015-01-19 DIAGNOSIS — N183 Chronic kidney disease, stage 3 (moderate): Secondary | ICD-10-CM | POA: Diagnosis not present

## 2015-01-19 DIAGNOSIS — E1122 Type 2 diabetes mellitus with diabetic chronic kidney disease: Secondary | ICD-10-CM | POA: Diagnosis not present

## 2015-01-20 ENCOUNTER — Other Ambulatory Visit: Payer: Self-pay | Admitting: *Deleted

## 2015-01-20 DIAGNOSIS — Z6833 Body mass index (BMI) 33.0-33.9, adult: Secondary | ICD-10-CM | POA: Diagnosis not present

## 2015-01-20 DIAGNOSIS — Z1389 Encounter for screening for other disorder: Secondary | ICD-10-CM | POA: Diagnosis not present

## 2015-01-20 DIAGNOSIS — N183 Chronic kidney disease, stage 3 (moderate): Secondary | ICD-10-CM | POA: Diagnosis not present

## 2015-01-20 DIAGNOSIS — I509 Heart failure, unspecified: Secondary | ICD-10-CM | POA: Diagnosis not present

## 2015-01-20 DIAGNOSIS — E876 Hypokalemia: Secondary | ICD-10-CM | POA: Diagnosis not present

## 2015-01-20 NOTE — Patient Outreach (Signed)
[place] [place] [facility]) Care Management  01/20/2015  GOBLE FUDALA 1934/03/25 [id number]   Referral received today for Mr. Khalid as he has had 4 hospital admissions this year in addition to 1 ED visit. Mr. Mccarter was recently discharged from the hospital after a CHF exacerbation. He lives at home and has 24/7 caregivers including his daughter in law Chen[place]eth who is his primary caregiver. Mrs. Mentel is very knowledgeable and manages all of Mr. Picone's care. She is the hands on caregiver M-W and private duty caregivers are present at all other times. Mr. Bisping has long term care insurance to help pay for these services. Mr. Chadderdon saw his primary care provider, Dr. Sharilyn Sites today. He saw his nephrologist, Dr. Louis Meckel, last week on Monday. I contacted his cardiologist, Dr. Kate Sable today and he reported that he would make arrangements for Mr. Steyer to have an appointment with him.   Today, [name] reports that Mr. Niebla's weight is 222# and has been between 221-223# since his discharge from the hospital on 01/08/15. He is receiving nursing home health services 2-3 times/weekly. [name] states that Mr. Sylvan does not having swelling but does seem to get very short of breath when he becomes anxious. I updated Dr. Bronson Ing on the information as outlined above.   I have made arrangements to see Mr. Murrell at home next week on Monday.    [place] Management  [phone]

## 2015-01-20 NOTE — Patient Outreach (Signed)
[facility]) Care Management  01/20/2015  [name] [name] [birth year] [id number]   Referral from [place] tier 4, assigned Janalyn Shy, RN to outreach for [place] [facility] services.  Thanks, Ronnell Freshwater. [place], [place] Assistant Phone: [phone] Fax: [phone]

## 2015-01-22 DIAGNOSIS — N183 Chronic kidney disease, stage 3 (moderate): Secondary | ICD-10-CM | POA: Diagnosis not present

## 2015-01-22 DIAGNOSIS — I2510 Atherosclerotic heart disease of native coronary artery without angina pectoris: Secondary | ICD-10-CM | POA: Diagnosis not present

## 2015-01-22 DIAGNOSIS — K219 Gastro-esophageal reflux disease without esophagitis: Secondary | ICD-10-CM | POA: Diagnosis not present

## 2015-01-22 DIAGNOSIS — Z8673 Personal history of transient ischemic attack (TIA), and cerebral infarction without residual deficits: Secondary | ICD-10-CM | POA: Diagnosis not present

## 2015-01-22 DIAGNOSIS — Z8546 Personal history of malignant neoplasm of prostate: Secondary | ICD-10-CM | POA: Diagnosis not present

## 2015-01-22 DIAGNOSIS — F419 Anxiety disorder, unspecified: Secondary | ICD-10-CM | POA: Diagnosis not present

## 2015-01-22 DIAGNOSIS — Z8551 Personal history of malignant neoplasm of bladder: Secondary | ICD-10-CM | POA: Diagnosis not present

## 2015-01-22 DIAGNOSIS — I82409 Acute embolism and thrombosis of unspecified deep veins of unspecified lower extremity: Secondary | ICD-10-CM | POA: Diagnosis not present

## 2015-01-22 DIAGNOSIS — F0390 Unspecified dementia without behavioral disturbance: Secondary | ICD-10-CM | POA: Diagnosis not present

## 2015-01-22 DIAGNOSIS — E1122 Type 2 diabetes mellitus with diabetic chronic kidney disease: Secondary | ICD-10-CM | POA: Diagnosis not present

## 2015-01-22 DIAGNOSIS — Z85528 Personal history of other malignant neoplasm of kidney: Secondary | ICD-10-CM | POA: Diagnosis not present

## 2015-01-22 DIAGNOSIS — G4733 Obstructive sleep apnea (adult) (pediatric): Secondary | ICD-10-CM | POA: Diagnosis not present

## 2015-01-22 DIAGNOSIS — I5043 Acute on chronic combined systolic (congestive) and diastolic (congestive) heart failure: Secondary | ICD-10-CM | POA: Diagnosis not present

## 2015-01-25 ENCOUNTER — Other Ambulatory Visit: Payer: Self-pay | Admitting: *Deleted

## 2015-01-25 NOTE — Patient Outreach (Signed)
[facility]) Care Management   01/25/2015  [name] [name] [birth year] [id number]  [name] [name] is an 81 y.o. male has had 4 hospital admissions this year in addition to 1 ED visit. Mr. [name] [name] was recently discharged from the hospital after a CHF exacerbation. He lives at home and has 24/7 caregivers including his daughter in law Taylor who is his primary caregiver. Mrs. [name] [name] is very knowledgeable and manages all of Mr. [name] [name]'s care. She is the hands on caregiver M-W and private duty caregivers are present at all other times. Mr. [name] [name] has long term care insurance to help pay for these services. Mr. [name] [name] saw his primary care provider, Dr. Sharilyn Sites today. He saw his nephrologist, Dr. Louis [name] [name], last week on Monday.He is receiving nursing home health services 2-3 times/weekly.  Subjective: "Today is an out of it day. He didn't sleep and he's been confused."  Objective:  BP 116/72 mmHg  Pulse 70  SpO2 96%  Review of Systems  Constitutional: Negative.   HENT: Negative.   Eyes: Positive for blurred vision.  Respiratory: Negative for cough, sputum production and shortness of breath.   Cardiovascular: Positive for leg swelling. Negative for chest pain.       1+ BLE edema  Gastrointestinal: Positive for vomiting and constipation.       "regurgitation of almost everything he eats or drinks" per daughter in law  Genitourinary: Negative.   Musculoskeletal: Positive for myalgias.  Skin: Negative.   Neurological: Negative.   Psychiatric/Behavioral: Positive for memory loss.    Physical Exam  Constitutional: Vital signs are normal. He appears well-developed and well-nourished. He appears lethargic. He has a sickly appearance.  Cardiovascular: Normal rate and regular rhythm.   Respiratory: Effort normal and breath sounds normal.  GI: Soft. Bowel sounds are normal.  Neurological: He appears lethargic.  Skin: Skin is warm and dry.  Psychiatric: His speech is normal. He  is slowed and withdrawn. Cognition and memory are impaired. He expresses impulsivity and inappropriate judgment. He is inattentive.    Current Medications:   Current Outpatient Prescriptions  Medication Sig Dispense Refill  . aspirin 81 MG chewable tablet Chew 1 tablet (81 mg total) by mouth daily.    [name] donepezil (ARICEPT) 10 MG tablet Take 10 mg by mouth at bedtime.    [name] escitalopram (LEXAPRO) 20 MG tablet Take 20 mg by mouth every morning.     [name] levothyroxine (SYNTHROID, LEVOTHROID) 125 MCG tablet Take 125 mcg by mouth daily before breakfast.    . midodrine (PROAMATINE) 10 MG tablet Take 10 mg by mouth 2 (two) times daily.    . mirabegron [name] (MYRBETRIQ) 50 MG TB24 tablet Take 1 tablet (50 mg total) by mouth every morning. (Patient not taking: Reported on 01/20/2015) 30 tablet 5  . pantoprazole (PROTONIX) 40 MG tablet TAKE ONE TABLET EVERY MORNING 30 tablet 4  . polyethylene glycol (MIRALAX / GLYCOLAX) packet Take 17 g by mouth at bedtime.    . potassium chloride SA (K-DUR,KLOR-CON) 20 MEQ tablet Take 20 mEq by mouth every morning.     . torsemide (DEMADEX) 20 MG tablet Take 20-40 mg daily as needed for leg swelling 180 tablet 3  . ziprasidone (GEODON) 20 MG capsule Take 20 mg by mouth every morning.      No current facility-administered medications for this visit.    Functional Status:   In your present state of health, do you have any difficulty performing the following activities: 01/05/2015 12/24/2014  Hearing? [place]? N -  Difficulty concentrating or making decisions? N -  Walking or climbing stairs? N -  Dressing or bathing? N -  Doing errands, shopping? Y N    Assessment:    Chronic Health Condition (CHF) - Mr. [name] [name] was discharged from the hospital 2 weeks ago after an admission for CHF. He still has peripheral edema today but his weight has stabilized. I updated Dr. Bronson Ing on his condition at the end of last week. Mr. [name] [name]'s caregivers are measuring his weight daily  and know to call for weight gain of 3 pounds overnight or 5 pounds in a week or increased edema, cough, chest pain, or other new symptom. Mr. [name] [name]'s medications are being administered by his caregivers. Mr. [name] [name]'s daughter in law is concerned that he "doesn't pee as much" in response to his Demadex. He was given Demadex 40mg  on Sunday and 20mg  today/Monday.   Chronic Health Condition (Dementia with worsening insomnia and confusion) - Mr. [name] [name] has worsening confusion and insomnia, per his daughter in law's report, since his discharge from the hospital. I notified Dr. Hilma Favors and asked if the nurse would contact his daughter in law about medication questions.   Plan:   I will see Mr. [name] [name] at home again next week for a routine home visit.   [facility] CM Care Plan Problem One        Most Recent Value   Care Plan Problem One  Chronic Health Condition (CHF) with recent hospitalization   Role Documenting the Problem One  Care Management Coordinator   Care Plan for Problem One  Active   [facility] Long Term Goal (31-90 days)  Over the next 31 days patient will not be admitted to the hospital for CHF   [facility] Long Term Goal Start Date  01/20/15   Interventions for Problem One Long Term Goal  Reviewed current patient status, plan of care with patient's daughter in law who is primary caregiver   [facility] CM Short Term Goal #1 (0-30 days)  Over the next 30 days patient and/or caregivers will verbalize understanding of signs and symptoms of volume overload and/or CHF exacerbation   [facility] CM Short Term Goal #1 Start Date  01/20/15   Interventions for Short Term Goal #1  Utilizing teachback method, discussed signs and symptoms of CHF exacerbation with patient's daughter in law/primary caregiver   [facility] CM Short Term Goal #2 (0-30 days)  Over the next 30 days patient and/or caregivers will verbalize understanding of when to call provider for symptoms   [facility] CM Short Term Goal #2 Start Date  01/20/15   Interventions for Short Term  Goal #2  Utilizing teachback method, discussed with primary caregiver when to call provider about symptoms,  explained that I would bring literature and blue [facility] Calendar with stop sign tool to assist with ongoing education during my initial home visit   [facility] CM Short Term Goal #3 (0-30 days)  Over the next 30 days, patient and/or caregivers will verbalize understanding of importance of dietary adhernece to control CHF symptoms   [facility] CM Short Term Goal #3 Start Date  01/20/15   Interventions for Short Tern Goal #3  Utilizing teachback method, reviewed with primary caregiver importance of dietary adherence for symptom management and details of prescribed diet    [facility] CM Care Plan Problem Two        Most Recent Value   Care Plan Problem Two  Acute/Chronic Health Condition (DEMENTIA) with worsening  confusion and insomnia   Role Documenting the Problem Two  Care Management [place] for Problem Two  Active   Interventions for Problem Two Long Term Goal   Notified provider of worsening symptoms,  reivewed medications with patient/caregiver   [facility] Long Term Goal (31-90) days  Caregivers will verbalize understanding of plan of care to address worsening confusion and insomnia over the next 31 days   [facility] Long Term Goal Start Date  01/25/15   [facility] CM Short Term Goal #1 (0-30 days)  Over the next 3 days, patient's caregiver will discuss insomnia treatment plan with primary care provider   [facility] CM Short Term Goal #1 Start Date  01/25/15   Interventions for Short Term Goal #2   notified provider of worsening insomnia      [place] Care Management  [phone]

## 2015-01-26 ENCOUNTER — Encounter (INDEPENDENT_AMBULATORY_CARE_PROVIDER_SITE_OTHER): Payer: Self-pay | Admitting: Internal Medicine

## 2015-01-26 ENCOUNTER — Encounter (INDEPENDENT_AMBULATORY_CARE_PROVIDER_SITE_OTHER): Payer: Self-pay | Admitting: *Deleted

## 2015-01-26 ENCOUNTER — Encounter: Payer: Self-pay | Admitting: *Deleted

## 2015-01-26 ENCOUNTER — Ambulatory Visit (INDEPENDENT_AMBULATORY_CARE_PROVIDER_SITE_OTHER): Payer: Medicare Other | Admitting: Internal Medicine

## 2015-01-26 VITALS — BP 102/74 | HR 72 | Temp 97.5°F | Ht 70.5 in | Wt 229.9 lb

## 2015-01-26 DIAGNOSIS — Z85528 Personal history of other malignant neoplasm of kidney: Secondary | ICD-10-CM | POA: Diagnosis not present

## 2015-01-26 DIAGNOSIS — K219 Gastro-esophageal reflux disease without esophagitis: Secondary | ICD-10-CM | POA: Diagnosis not present

## 2015-01-26 DIAGNOSIS — I5043 Acute on chronic combined systolic (congestive) and diastolic (congestive) heart failure: Secondary | ICD-10-CM | POA: Diagnosis not present

## 2015-01-26 DIAGNOSIS — Z8546 Personal history of malignant neoplasm of prostate: Secondary | ICD-10-CM | POA: Diagnosis not present

## 2015-01-26 DIAGNOSIS — R1310 Dysphagia, unspecified: Secondary | ICD-10-CM | POA: Diagnosis not present

## 2015-01-26 DIAGNOSIS — I82409 Acute embolism and thrombosis of unspecified deep veins of unspecified lower extremity: Secondary | ICD-10-CM | POA: Diagnosis not present

## 2015-01-26 DIAGNOSIS — F419 Anxiety disorder, unspecified: Secondary | ICD-10-CM | POA: Diagnosis not present

## 2015-01-26 DIAGNOSIS — G4733 Obstructive sleep apnea (adult) (pediatric): Secondary | ICD-10-CM | POA: Diagnosis not present

## 2015-01-26 DIAGNOSIS — I2510 Atherosclerotic heart disease of native coronary artery without angina pectoris: Secondary | ICD-10-CM | POA: Diagnosis not present

## 2015-01-26 DIAGNOSIS — Z8673 Personal history of transient ischemic attack (TIA), and cerebral infarction without residual deficits: Secondary | ICD-10-CM | POA: Diagnosis not present

## 2015-01-26 DIAGNOSIS — E1122 Type 2 diabetes mellitus with diabetic chronic kidney disease: Secondary | ICD-10-CM | POA: Diagnosis not present

## 2015-01-26 DIAGNOSIS — F0390 Unspecified dementia without behavioral disturbance: Secondary | ICD-10-CM | POA: Diagnosis not present

## 2015-01-26 DIAGNOSIS — N183 Chronic kidney disease, stage 3 (moderate): Secondary | ICD-10-CM | POA: Diagnosis not present

## 2015-01-26 DIAGNOSIS — Z8551 Personal history of malignant neoplasm of bladder: Secondary | ICD-10-CM | POA: Diagnosis not present

## 2015-01-26 NOTE — Patient Instructions (Addendum)
DG esophagram. Avoid steaks, tough meats. breads

## 2015-01-26 NOTE — Progress Notes (Signed)
Subjective:    Patient ID: [name] [name], male    DOB: 04-19-1933, 81 y.o.   MRN: [id number]  HPI Presents today for f/u. Daughter in law states he has a sickies feeling on his stomach.  If he eats he may vomit.  He has had symptoms for about a week. There has been no weight loss. Hx of CHF.  Appetite is good. Daughter in law says he is up all night eating.  Usually has a BM daily if he takes Miralax. ? Dysphagia. Patient has dementia.  Last EGD/ED was in 2014 by Dr. Laural Golden.      01/08/2013 EGD with ED.  Indications: Patient is 55 old Caucasian male with chronic GERD who presents with dysphagia to solids. Patient states his heartburn is well controlled with pantoprazole. Last esophageal dilation was in December 2011.  Impression: No evidence of erosive esophagitis ring or stricture. Small sliding hiatal hernia. Erosive antral gastritis. Esophagus dilated by passing 56 and 58 French Maloney dilators but no mucosal destruction induced.  Review of Systems Past Medical History  Diagnosis Date  . Chronic constipation   . GERD (gastroesophageal reflux disease)   . Hyperlipidemia   . Orthostatic hypotension 11/19/2013  . Coronary artery disease     a. 12/2013 - STEMI s/p CABGx4.  . Mild dementia   . Hypothyroidism   . History of esophageal dilatation   . History of gastritis   . History of hiatal hernia   . History of bladder cancer     hx  TCC low grade  . Kidney tumor     right  . BPH (benign prostatic hyperplasia)   . CKD (chronic kidney disease) stage 3, GFR 30-59 ml/min     a. h/o nephrectomy.  [name] History of kidney stones   . ED (erectile dysfunction)   . S/P CABG x 4     12-18-2013  . Bladder tumor   . DVT (deep venous thrombosis) ([place])     a. s/p IVC filter 09/2014. Xarelto stopped at that time due to hematuria and hemoccult + stools.  . Myocardial infarction ([place])  12/2013  . Stroke [facility])     TIA  . Chronic combined systolic and diastolic CHF (congestive heart failure) (HCC)     systolic and diastolic heart failure  . Depression   . Hematuria   . Heme positive stool   . Anemia   . Shortness of breath dyspnea     non change in status per dtr-in-low depends on weather and activity   . Type 2 diabetes mellitus (HCC)     borderline on no medications   . Anxiety   . Prostate cancer ([place])     low-grade and bladder cancer and kidney cancer   . OSA on CPAP     mild osa   per sleep study 07-11-2009- patient states does not use regularly    Past Surgical History  Procedure Laterality Date  . Upper gastrointestinal endoscopy  04/06/2010  &  01-08-2013    w/ esophageal dilatation  . Hemorrhoid surgery    . Hand surgery      right  . Wrist fracture surgery      pins placed-left  . Cystoscopy with biopsy  01/09/2012    Procedure: CYSTOSCOPY WITH BIOPSY;  Surgeon: Marissa Nestle, MD;  Location: [facility] ORS;  Service: Urology;  Laterality: N/A;  Cystoscopy with Multiple Bladder Biopsies  . Esophagogastroduodenoscopy (egd) with esophageal dilation N/A 01/08/2013    Procedure: ESOPHAGOGASTRODUODENOSCOPY (  EGD) WITH ESOPHAGEAL DILATION;  Surgeon: Rogene Houston, MD;  Location: [facility] ENDO SUITE;  Service: Endoscopy;  Laterality: N/A;  120  . Left heart cath N/A 12/17/2013    Procedure: LEFT HEART CATH;  Surgeon: Leonie Man, MD;  Location: [facility] CATH LAB;  Service: Cardiovascular;  Laterality: N/A;  . Left heart catheterization with coronary angiogram N/A 12/17/2013    Procedure: LEFT HEART CATHETERIZATION WITH CORONARY ANGIOGRAM;  Surgeon: Leonie Man, MD;  Location: [facility] CATH LAB;  Service: Cardiovascular;  Laterality: N/A;  . Colonoscopy with esophagogastroduodenoscopy (egd)  08-15-2006    w/ esophageal dilatation  . Umbilical hernia repair  04-24-2003  . Transurethral resection of bladder tumor  06-14-2004  . Transurethral resection of prostate  10-04-2004  .  Cysto/  removal bladder calculus/  resection bladder tumor  10-16-2006  . Cystostomy w/ bladder biopsy  01-09-2012  . Cardiac catheterization  12-17-2013   dr Shanon Brow harding    severe disease RCA with thrombotic appearing subtotal occlusions/  95-99%  OM2/  diffuse disease LAD  and D1/  mild to moderate basal to mid inferior hypokinesis/  severe elevated LVEDP/  ef 45-50%  . Coronary artery bypass graft N/A 12/18/2013    Procedure: Coronary artery bypass grafting times four using left internal mammary artery and endoscopically harvested right saphenous vein graft;  Surgeon: Gaye Pollack, MD;  Location: [facility] OR;  Service: Open Heart Surgery;  Laterality: N/A;  . Transthoracic echocardiogram  11-14-2012    grade I diastolic dysfunction/  ef 55-60%/  trivial MR  and TR  . Cardiovascular stress test  08-09-2009  dr berry    mild to moderate perfusion defect in basal , mid, and apical inferior regions consistent with an infart/scar/   No evidence ischemia/  ef 51%/  no ECG changes/   low risk scan  . Transurethral resection of bladder tumor N/A 04/07/2014    Procedure: TRANSURETHRAL RESECTION OF BLADDER TUMOR (TURBT);  Surgeon: Arvil Persons, MD;  Location[facility];  Service: Urology;  Laterality: N/A;  . Cystoscopy/retrograde/ureteroscopy Right 04/07/2014    Procedure: CYSTOSCOPY/RETROGRADE/URETEROSCOPY WITH BIOPSY OF RENAL PELVIS TUMOR;  Surgeon: Arvil Persons, MD;  Location: [facility];  Service: Urology;  Laterality: Right;  . Robot assisted laparoscopic nephrectomy Right 06/05/2014    Procedure: ROBOTIC ASSISTED LAPAROSCOPIC RIGHT NEPHROURETERECTOMY, VENTRAL HERNIA REPAIR ;  Surgeon: Ardis Hughs, MD;  Location: [facility] ORS;  Service: Urology;  Laterality: Right;  . Cystoscopy w/ ureteral stent placement Right 06/05/2014    Procedure: CYSTOSCOPY ;  Surgeon: Ardis Hughs, MD;  Location: [facility] ORS;  Service: Urology;  Laterality: Right;  . Radiology with anesthesia N/A  06/22/2014    Procedure: MRI BRAIN W/O CONTRAST;  Surgeon: Medication Radiologist, MD;  Location: [place];  Service: Radiology;  Laterality: N/A;  . Eye surgery      cataract surgery  . Hernia repair  16/1096    umbilical hernia repair at time of nephrectomy  . Right kidney removal     . Ivc filter placement ([facility] hx)  09/17/2014   . Transurethral resection of bladder tumor N/A 12/24/2014    Procedure: TRANSURETHRAL RESECTION OF BLADDER TUMOR (TURBT);  Surgeon: Ardis Hughs, MD;  Location: [facility] ORS;  Service: Urology;  Laterality: N/A;    Allergies  Allergen Reactions  . Lorazepam     Hallucinations, worsens agitation  . Statins Other (See Comments)    MUSCLE ACHES    Current Outpatient  Prescriptions on File Prior to Visit  Medication Sig Dispense Refill  . aspirin 81 MG chewable tablet Chew 1 tablet (81 mg total) by mouth daily.    [name] donepezil (ARICEPT) 10 MG tablet Take 10 mg by mouth at bedtime.    [name] escitalopram (LEXAPRO) 20 MG tablet Take 20 mg by mouth every morning.     [name] levothyroxine (SYNTHROID, LEVOTHROID) 125 MCG tablet Take 125 mcg by mouth daily before breakfast.    . midodrine (PROAMATINE) 10 MG tablet Take 10 mg by mouth 2 (two) times daily.    . pantoprazole (PROTONIX) 40 MG tablet TAKE ONE TABLET EVERY MORNING 30 tablet 4  . polyethylene glycol (MIRALAX / GLYCOLAX) packet Take 17 g by mouth at bedtime.    . potassium chloride SA (K-DUR,KLOR-CON) 20 MEQ tablet Take 40 mEq by mouth every morning.     . temazepam (RESTORIL) 15 MG capsule Take 15 mg by mouth at bedtime as needed for sleep.    [name] torsemide (DEMADEX) 20 MG tablet Take 20-40 mg daily as needed for leg swelling 180 tablet 3  . ziprasidone (GEODON) 20 MG capsule Take 20 mg by mouth every morning.     . mirabegron ER (MYRBETRIQ) 50 MG TB24 tablet Take 1 tablet (50 mg total) by mouth every morning. (Patient not taking: Reported on 01/26/2015) 30 tablet 5   No current facility-administered medications on file  prior to visit.        Objective:   Physical Exam Blood pressure 102/74, pulse 72, temperature 97.5 F (36.4 C), height 5' 10.5" (1.791 m), weight 229 lb 14.4 oz (104.282 kg). Alert and oriented. Skin warm and dry. Oral mucosa is moist.   . Sclera anicteric, conjunctivae is pink. Thyroid not enlarged. No cervical lymphadenopathy. Lungs clear. Heart regular rate and rhythm.  Abdomen is soft. Bowel sounds are positive. No hepatomegaly. No abdominal masses felt. No tenderness.  No edema to lower extremities. Patient is alert and oriented.    Assessment/Plan: Dysphagia to solids. Am going to get an Esophagram. Further recommendations to follow.

## 2015-01-27 ENCOUNTER — Ambulatory Visit (INDEPENDENT_AMBULATORY_CARE_PROVIDER_SITE_OTHER): Payer: Medicare Other | Admitting: Physician Assistant

## 2015-01-27 ENCOUNTER — Encounter: Payer: Self-pay | Admitting: Physician Assistant

## 2015-01-27 ENCOUNTER — Other Ambulatory Visit (HOSPITAL_COMMUNITY)
Admission: RE | Admit: 2015-01-27 | Discharge: 2015-01-27 | Disposition: A | Discharge status: Home / Self Care | Payer: Medicare Other | Source: Ambulatory Visit | Attending: Physician Assistant | Admitting: Physician Assistant

## 2015-01-27 VITALS — BP 122/80 | HR 80 | Ht 70.5 in | Wt 231.2 lb

## 2015-01-27 DIAGNOSIS — E1122 Type 2 diabetes mellitus with diabetic chronic kidney disease: Secondary | ICD-10-CM | POA: Diagnosis not present

## 2015-01-27 DIAGNOSIS — I509 Heart failure, unspecified: Secondary | ICD-10-CM | POA: Diagnosis not present

## 2015-01-27 DIAGNOSIS — Z8673 Personal history of transient ischemic attack (TIA), and cerebral infarction without residual deficits: Secondary | ICD-10-CM | POA: Diagnosis not present

## 2015-01-27 DIAGNOSIS — N183 Chronic kidney disease, stage 3 unspecified: Secondary | ICD-10-CM

## 2015-01-27 DIAGNOSIS — G4733 Obstructive sleep apnea (adult) (pediatric): Secondary | ICD-10-CM | POA: Diagnosis not present

## 2015-01-27 DIAGNOSIS — R1310 Dysphagia, unspecified: Secondary | ICD-10-CM

## 2015-01-27 DIAGNOSIS — I2510 Atherosclerotic heart disease of native coronary artery without angina pectoris: Secondary | ICD-10-CM

## 2015-01-27 DIAGNOSIS — K219 Gastro-esophageal reflux disease without esophagitis: Secondary | ICD-10-CM | POA: Diagnosis not present

## 2015-01-27 DIAGNOSIS — Z85528 Personal history of other malignant neoplasm of kidney: Secondary | ICD-10-CM | POA: Diagnosis not present

## 2015-01-27 DIAGNOSIS — I82409 Acute embolism and thrombosis of unspecified deep veins of unspecified lower extremity: Secondary | ICD-10-CM | POA: Diagnosis not present

## 2015-01-27 DIAGNOSIS — I5043 Acute on chronic combined systolic (congestive) and diastolic (congestive) heart failure: Secondary | ICD-10-CM | POA: Diagnosis not present

## 2015-01-27 DIAGNOSIS — F0390 Unspecified dementia without behavioral disturbance: Secondary | ICD-10-CM | POA: Diagnosis not present

## 2015-01-27 DIAGNOSIS — Z8546 Personal history of malignant neoplasm of prostate: Secondary | ICD-10-CM | POA: Diagnosis not present

## 2015-01-27 DIAGNOSIS — F419 Anxiety disorder, unspecified: Secondary | ICD-10-CM | POA: Diagnosis not present

## 2015-01-27 DIAGNOSIS — Z8551 Personal history of malignant neoplasm of bladder: Secondary | ICD-10-CM | POA: Diagnosis not present

## 2015-01-27 LAB — BASIC METABOLIC PANEL
ANION GAP: 11 (ref 5–15)
BUN: 37 mg/dL — AB (ref 6–20)
CHLORIDE: 101 mmol/L (ref 101–111)
CO2: 28 mmol/L (ref 22–32)
Calcium: 9.1 mg/dL (ref 8.9–10.3)
Creatinine, Ser: 2.11 mg/dL — ABNORMAL HIGH (ref 0.61–1.24)
GFR, EST AFRICAN AMERICAN: 33 mL/min — AB (ref >60)
GFR, EST NON AFRICAN AMERICAN: 28 mL/min — AB (ref >60)
Glucose, Bld: 113 mg/dL — ABNORMAL HIGH (ref 65–99)
Potassium: 3.9 mmol/L (ref 3.5–5.1)
Sodium: 140 mmol/L (ref 135–145)

## 2015-01-27 NOTE — Progress Notes (Signed)
Cardiology Office Note   Date:  01/27/2015   ID:  [name] [name], DOB [birth year], MRN [id number]  PCP:  Purvis Kilts, MD  Cardiologist:  Dr. Bronson Ing  Chief Complaint: Nausea    History of Present Illness: [name] [name] is a 81 y.o. male who presents for post hospital follow-up. He has history of CAD status post CABG 4 in 2014, combined CHF, hyperlipidemia, diabetes, CVA and right leg DVT status post IVC filter 09/17/14 no anticoagulation due to bladder tumor and hematuria. Patient has had multiple admissions for CHF that's been difficult to control because of his hypotension. He has been on Florinef and midodrine in the past to help control this. Most recent echo on 08/2014 EF 40-45% with moderate pulmonary hypertension. He has CKD with solitary kidney and has been on and off Lasix for his edema. He was readmitted 01/04/15 after increased dyspnea after having a TURP. He diuresed and his Florinef and Midrin were stopped. He is being followed closely by home health.  The patient's main problem right now is nausea and feeling jittery. He is unable to sleep at night. He is constantly moving to try to get comfortable. He saw GI yesterday and is scheduled for an esophagram. Restoril is not working for his sleep. He gained 4 pounds overnight. He is not eating well. He had a lot of edema yesterday but not so much today. His breathing has been okay.    Past Medical History  Diagnosis Date  . Chronic constipation   . GERD (gastroesophageal reflux disease)   . Hyperlipidemia   . Orthostatic hypotension 11/19/2013  . Coronary artery disease     a. 12/2013 - STEMI s/p CABGx4.  . Mild dementia   . Hypothyroidism   . History of esophageal dilatation   . History of gastritis   . History of hiatal hernia   . History of bladder cancer     hx  TCC low grade  . Kidney tumor     right  . BPH (benign prostatic hyperplasia)   . CKD (chronic kidney disease) stage 3, GFR 30-59 ml/min     a. h/o  nephrectomy.  [name] History of kidney stones   . ED (erectile dysfunction)   . S/P CABG x 4     12-18-2013  . Bladder tumor   . DVT (deep venous thrombosis) ([place])     a. s/p IVC filter 09/2014. Xarelto stopped at that time due to hematuria and hemoccult + stools.  . Myocardial infarction ([place]) 12/2013  . Stroke [facility])     TIA  . Chronic combined systolic and diastolic CHF (congestive heart failure) (HCC)     systolic and diastolic heart failure  . Depression   . Hematuria   . Heme positive stool   . Anemia   . Shortness of breath dyspnea     non change in status per dtr-in-low depends on weather and activity   . Type 2 diabetes mellitus (HCC)     borderline on no medications   . Anxiety   . Prostate cancer ([place])     low-grade and bladder cancer and kidney cancer   . OSA on CPAP     mild osa   per sleep study 07-11-2009- patient states does not use regularly    Past Surgical History  Procedure Laterality Date  . Upper gastrointestinal endoscopy  04/06/2010  &  01-08-2013    w/ esophageal dilatation  . Hemorrhoid surgery    . Hand surgery  right  . Wrist fracture surgery      pins placed-left  . Cystoscopy with biopsy  01/09/2012    Procedure: CYSTOSCOPY WITH BIOPSY;  Surgeon: Marissa Nestle, MD;  Location: [facility] ORS;  Service: Urology;  Laterality: N/A;  Cystoscopy with Multiple Bladder Biopsies  . Esophagogastroduodenoscopy (egd) with esophageal dilation N/A 01/08/2013    Procedure: ESOPHAGOGASTRODUODENOSCOPY (EGD) WITH ESOPHAGEAL DILATION;  Surgeon: Rogene Houston, MD;  Location: [facility] ENDO SUITE;  Service: Endoscopy;  Laterality: N/A;  120  . Left heart cath N/A 12/17/2013    Procedure: LEFT HEART CATH;  Surgeon: Leonie Man, MD;  Location: [facility] CATH LAB;  Service: Cardiovascular;  Laterality: N/A;  . Left heart catheterization with coronary angiogram N/A 12/17/2013    Procedure: LEFT HEART CATHETERIZATION WITH CORONARY ANGIOGRAM;  Surgeon: Leonie Man, MD;  Location: [facility]  CATH LAB;  Service: Cardiovascular;  Laterality: N/A;  . Colonoscopy with esophagogastroduodenoscopy (egd)  08-15-2006    w/ esophageal dilatation  . Umbilical hernia repair  04-24-2003  . Transurethral resection of bladder tumor  06-14-2004  . Transurethral resection of prostate  10-04-2004  . Cysto/  removal bladder calculus/  resection bladder tumor  10-16-2006  . Cystostomy w/ bladder biopsy  01-09-2012  . Cardiac catheterization  12-17-2013   dr Shanon Brow harding    severe disease RCA with thrombotic appearing subtotal occlusions/  95-99%  OM2/  diffuse disease LAD  and D1/  mild to moderate basal to mid inferior hypokinesis/  severe elevated LVEDP/  ef 45-50%  . Coronary artery bypass graft N/A 12/18/2013    Procedure: Coronary artery bypass grafting times four using left internal mammary artery and endoscopically harvested right saphenous vein graft;  Surgeon: Gaye Pollack, MD;  Location: [facility] OR;  Service: Open Heart Surgery;  Laterality: N/A;  . Transthoracic echocardiogram  11-14-2012    grade I diastolic dysfunction/  ef 55-60%/  trivial MR  and TR  . Cardiovascular stress test  08-09-2009  dr berry    mild to moderate perfusion defect in basal , mid, and apical inferior regions consistent with an infart/scar/   No evidence ischemia/  ef 51%/  no ECG changes/   low risk scan  . Transurethral resection of bladder tumor N/A 04/07/2014    Procedure: TRANSURETHRAL RESECTION OF BLADDER TUMOR (TURBT);  Surgeon: Arvil Persons, MD;  [facility]  Service: Urology;  Laterality: N/A;  . Cystoscopy/retrograde/ureteroscopy Right 04/07/2014    Procedure: CYSTOSCOPY/RETROGRADE/URETEROSCOPY WITH BIOPSY OF RENAL PELVIS TUMOR;  Surgeon: Arvil Persons, MD;  Location: [facility];  Service: Urology;  Laterality: Right;  . Robot assisted laparoscopic nephrectomy Right 06/05/2014    Procedure: ROBOTIC ASSISTED LAPAROSCOPIC RIGHT NEPHROURETERECTOMY, VENTRAL HERNIA REPAIR ;   Surgeon: Ardis Hughs, MD;  Location: [facility] ORS;  Service: Urology;  Laterality: Right;  . Cystoscopy w/ ureteral stent placement Right 06/05/2014    Procedure: CYSTOSCOPY ;  Surgeon: Ardis Hughs, MD;  Location: [facility] ORS;  Service: Urology;  Laterality: Right;  . Radiology with anesthesia N/A 06/22/2014    Procedure: MRI BRAIN W/O CONTRAST;  Surgeon: Medication Radiologist, MD;  Location: [place];  Service: Radiology;  Laterality: N/A;  . Eye surgery      cataract surgery  . Hernia repair  [id number]/[id number]    umbilical hernia repair at time of nephrectomy  . Right kidney removal     . Ivc filter placement ([facility] hx)  09/17/2014   . Transurethral resection of  bladder tumor N/A 12/24/2014    Procedure: TRANSURETHRAL RESECTION OF BLADDER TUMOR (TURBT);  Surgeon: Ardis Hughs, MD;  Location: [facility] ORS;  Service: Urology;  Laterality: N/A;     Current Outpatient Prescriptions  Medication Sig Dispense Refill  . aspirin 81 MG chewable tablet Chew 1 tablet (81 mg total) by mouth daily.    [name] donepezil (ARICEPT) 10 MG tablet Take 10 mg by mouth at bedtime.    [name] escitalopram (LEXAPRO) 20 MG tablet Take 20 mg by mouth every morning.     [name] levothyroxine (SYNTHROID, LEVOTHROID) 125 MCG tablet Take 125 mcg by mouth daily before breakfast.    . midodrine (PROAMATINE) 10 MG tablet Take 10 mg by mouth 2 (two) times daily.    . mirabegron ER (MYRBETRIQ) 50 MG TB24 tablet Take 1 tablet (50 mg total) by mouth every morning. 30 tablet 5  . pantoprazole (PROTONIX) 40 MG tablet TAKE ONE TABLET EVERY MORNING 30 tablet 4  . polyethylene glycol (MIRALAX / GLYCOLAX) packet Take 17 g by mouth at bedtime.    . potassium chloride SA (K-DUR,KLOR-CON) 20 MEQ tablet Take 40 mEq by mouth every morning.     . temazepam (RESTORIL) 15 MG capsule Take 15 mg by mouth at bedtime as needed for sleep.    [name] torsemide (DEMADEX) 20 MG tablet Take 20-40 mg daily as needed for leg swelling 180 tablet 3  . ziprasidone (GEODON) 20 MG capsule  Take 20 mg by mouth every morning.      No current facility-administered medications for this visit.    Allergies:   Lorazepam and Statins    Social History:  The patient  reports that he quit smoking about 55 years ago. His smoking use included Cigarettes. He started smoking about 62 years ago. He has a 4 pack-year smoking history. He quit smokeless tobacco use about 55 years ago. His smokeless tobacco use included Chew. He reports that he does not drink alcohol or use illicit drugs.   Family History:  The patient's   family history includes Cancer - Lung in his brother; Heart attack in his mother; Heart disease in his mother.    ROS:  Please see the history of present illness.   Otherwise, review of systems are positive for weakness, insomnia, decreased appetite, shortness of breath.   All other systems are reviewed and negative.    PHYSICAL EXAM: VS:  BP 122/80 mmHg  Pulse 80  Ht 5' 10.5" (1.791 m)  Wt 231 lb 3.2 oz (104.872 kg)  BMI 32.69 kg/m2  SpO2 95% , BMI Body mass index is 32.69 kg/(m^2). GEN: Well nourished, well developed, in no acute distress Neck: Slight increase JVD, HJR, no carotid bruits, or masses Cardiac:  RRR; positive S4 2/6 systolic murmur at the left sternal border, no rubs, thrill or heave,  Respiratory:  Decreased breath sounds but clear to auscultation bilaterally, normal work of breathing GI: Distended, soft, nontender,  + BS MS: no deformity or atrophy Extremities: +1 edema bilaterally without cyanosis, clubbing,  good distal pulses bilaterally.  Skin: warm and dry, no rash Neuro:  Strength and sensation are intact    EKG:  EKG is ordered today. The ekg ordered today demonstrates normal sinus rhythm with incomplete right bundle branch block   Recent Labs: 08/17/2014: TSH 5.346* 01/04/2015: B Natriuretic Peptide 2464.0* 01/05/2015: ALT 19; Hemoglobin 9.6*; Platelets 179 01/07/2015: Magnesium 2.2 01/08/2015: BUN 34*; Creatinine, Ser 2.01*; Potassium  3.6; Sodium 141    Lipid Panel No  results found for: CHOL, TRIG, HDL, CHOLHDL, VLDL, LDLCALC, LDLDIRECT    Wt Readings from Last 3 Encounters:  01/27/15 231 lb 3.2 oz (104.872 kg)  01/26/15 229 lb 14.4 oz (104.282 kg)  01/05/15 232 lb 5.8 oz (105.4 kg)      Other studies Reviewed: Additional studies/ records that were reviewed today include and review of the records demonstrates:   Prior Cardiac Testing/Procedures 1. Echocardiogram 08/17/2014 Left ventricle: The cavity size was mildly dilated. Wall   thickness was normal. Systolic function was mildly to moderately   reduced. The estimated ejection fraction was in the range of 40%   to 45%. Findings consistent with left ventricular diastolic   dysfunction. Doppler parameters are consistent with high   ventricular filling pressure. - Regional wall motion abnormality: Mild hypokinesis of the   basal-mid inferior and basal-mid inferolateral myocardium. - Ventricular septum: Septal motion showed abnormal function and   dyssynergy. These changes are consistent with intraventricular   conduction delay. - Aortic valve: Moderately calcified annulus. - Mitral valve: There was moderate eccentric regurgitation. - Right ventricle: The cavity size was mildly dilated. Systolic   function was moderately to severely reduced. - Tricuspid valve: There was mild-moderate regurgitation. - Pulmonic valve: Moderately elevated pulmonary pressures (48   mmHg). - Systemic veins: Dilated IVC, with normal respiratory variation.   Estimated CVP 8 mmHg.  2. Cardiac Cath 12/17/2013 POST-OPERATIVE DIAGNOSIS:    Severe multivessel disease with inferior ST elevation culprit lesions being the thrombotic subtotal occlusions of the RCA. However there is a 95-99% lesion in the OM 2 vessel diffuse severe disease in the proximal LAD and first diagonal branch  Mildly reduced EF with inferior hypokinesis  Severely elevated LVEDP   3.   Coronary artery bypass  grafting x 4  12/2013  Left internal mammary graft to the LAD  SVG to diagonal  SVG to OM  SVG to PDA Endoscopic vein harvest from the right leg    ASSESSMENT AND PLAN:  Acute on chronic systolic and diastolic heart failure, NYHA class 3 Patient has had a weight gain of 4 pounds overnight. He is uncomfortable. We'll give extra Demadex 20 mg and potassium today. I told his daughter-in-law that she can give him next her Demadex and potassium for weight gain of 2 or 3 pounds overnight. We will check labs today. Refer to heart failure clinic in [place] per [place]. Home health is also seeing an can help manage. His blood pressure has been better off the Florinef and midodrine. He has had no further falls or dizziness. His hypotension has limited our ability to add any other medications.  CAD, multiple vessel Stable without chest pain  DVT (deep venous thrombosis) IVC filter in June. No Coumadin because of chronic bladder tumors and hematuria  Dysphagia Patient has significant nausea and dysphagia. He is scheduled for an esophagram next week. Being followed closely by GI.  CKD (chronic kidney disease) stage 3, GFR 30-59 ml/min Checking renal function today.    Sumner Boast, PA-C  01/27/2015 11:48 AM    [facility] [address], [place], [place]  [address] Phone: [phone]; Fax: [phone]

## 2015-01-27 NOTE — Assessment & Plan Note (Signed)
Patient has had a weight gain of 4 pounds overnight. He is uncomfortable. We'll give extra Demadex 20 mg and potassium today. I told his daughter-in-law that she can give him next her Demadex and potassium for weight gain of 2 or 3 pounds overnight. We will check labs today. Refer to heart failure clinic in [place] per [place]. Home health is also seeing an can help manage. His blood pressure has been better off the Florinef and midodrine. He has had no further falls or dizziness. His hypotension has limited our ability to add any other medications.

## 2015-01-27 NOTE — Patient Instructions (Signed)
Your physician recommends that you schedule a follow-up appointment in: 2 months.  Your physician recommends that you have  lab work done today.   Your physician has recommended you make the following change in your medication:  Please take an extra dose of Demadex and Potassium when you get home today  You may take and extra dose of Demadex and Potassium on days of weight gain over night 2-3 pounds.  You have been referred to CHF clinic   If you need a refill on your cardiac medications before your next appointment, please call your pharmacy.  Thank you for choosing [facility]!

## 2015-01-27 NOTE — Assessment & Plan Note (Signed)
Patient has significant nausea and dysphagia. He is scheduled for an esophagram next week. Being followed closely by GI.

## 2015-01-27 NOTE — Assessment & Plan Note (Signed)
Stable without chest pain 

## 2015-01-27 NOTE — Assessment & Plan Note (Signed)
Checking renal function today. 

## 2015-01-27 NOTE — Assessment & Plan Note (Signed)
IVC filter in June. No Coumadin because of chronic bladder tumors and hematuria

## 2015-01-28 DIAGNOSIS — F419 Anxiety disorder, unspecified: Secondary | ICD-10-CM | POA: Diagnosis not present

## 2015-01-28 DIAGNOSIS — E1122 Type 2 diabetes mellitus with diabetic chronic kidney disease: Secondary | ICD-10-CM | POA: Diagnosis not present

## 2015-01-28 DIAGNOSIS — K219 Gastro-esophageal reflux disease without esophagitis: Secondary | ICD-10-CM | POA: Diagnosis not present

## 2015-01-28 DIAGNOSIS — Z8551 Personal history of malignant neoplasm of bladder: Secondary | ICD-10-CM | POA: Diagnosis not present

## 2015-01-28 DIAGNOSIS — I82409 Acute embolism and thrombosis of unspecified deep veins of unspecified lower extremity: Secondary | ICD-10-CM | POA: Diagnosis not present

## 2015-01-28 DIAGNOSIS — N183 Chronic kidney disease, stage 3 (moderate): Secondary | ICD-10-CM | POA: Diagnosis not present

## 2015-01-28 DIAGNOSIS — I5043 Acute on chronic combined systolic (congestive) and diastolic (congestive) heart failure: Secondary | ICD-10-CM | POA: Diagnosis not present

## 2015-01-28 DIAGNOSIS — F0390 Unspecified dementia without behavioral disturbance: Secondary | ICD-10-CM | POA: Diagnosis not present

## 2015-01-28 DIAGNOSIS — I2510 Atherosclerotic heart disease of native coronary artery without angina pectoris: Secondary | ICD-10-CM | POA: Diagnosis not present

## 2015-01-28 DIAGNOSIS — G4733 Obstructive sleep apnea (adult) (pediatric): Secondary | ICD-10-CM | POA: Diagnosis not present

## 2015-01-28 DIAGNOSIS — Z85528 Personal history of other malignant neoplasm of kidney: Secondary | ICD-10-CM | POA: Diagnosis not present

## 2015-01-28 DIAGNOSIS — Z8673 Personal history of transient ischemic attack (TIA), and cerebral infarction without residual deficits: Secondary | ICD-10-CM | POA: Diagnosis not present

## 2015-01-28 DIAGNOSIS — Z8546 Personal history of malignant neoplasm of prostate: Secondary | ICD-10-CM | POA: Diagnosis not present

## 2015-02-01 ENCOUNTER — Other Ambulatory Visit: Payer: Self-pay | Admitting: *Deleted

## 2015-02-01 NOTE — Patient Outreach (Signed)
[facility]) Care Management   02/01/2015  [name] [name] [birth year] [id number]  [name] [name] is an 81 y.o. male  has had 4 hospital admissions this year in addition to 1 ED visit. [name] [name] was recently discharged from the hospital after a CHF exacerbation. He lives at home and has 24/7 caregivers including his daughter in law [name] who is his primary caregiver. [name] [name] is very knowledgeable and manages all of [name] [name]'s care. She is the hands on caregiver M-W and private duty caregivers are present at all other times. [name] [name] has long term care insurance to help pay for these services. [name] [name] saw his primary care provider, Dr. Sharilyn Sites today. He saw his nephrologist, Dr. Louis Meckel, last week on Monday.He is receiving nursing home health services 2-3 times/weekly.  Subjective: "I think he's doing a little better. He finally got some sleep."  Objective:  BP 122/78 mmHg  Pulse 76  Ht 1.791 m (5' 10.5")  Wt 222 lb (100.699 kg)  BMI 31.39 kg/m2  SpO2 93%  Review of Systems  Constitutional: Positive for malaise/fatigue.  HENT: Negative.   Eyes: Positive for blurred vision.       Patient says "my glasses don't work"  Respiratory: Negative for shortness of breath and wheezing.   Cardiovascular: Positive for leg swelling.  Gastrointestinal: Positive for constipation.       Caregiver reports "he can have a bowel movement but he says he feels like he can't quite empty his bowels..he says he still feels like he needs to go."  Genitourinary:       Urinary incontinence  Musculoskeletal: Positive for myalgias. Negative for falls.  Skin: Negative.   Psychiatric/Behavioral: Positive for memory loss.    Physical Exam  Constitutional: Vital signs are normal. He appears well-developed and well-nourished. He appears lethargic. He is cooperative. He is easily aroused.  Cardiovascular: Normal rate, regular rhythm and intact distal pulses.  PMI is displaced.  Exam  reveals gallop and S4.   Murmur heard.  Systolic murmur is present with a grade of 2/6  Respiratory: Effort normal and breath sounds normal.  GI: Soft. Bowel sounds are normal.  Neurological: He is easily aroused. He appears lethargic. Coordination and gait abnormal.  Skin: Skin is warm, dry and intact.  Psychiatric: His affect is labile. His speech is slurred. He is slowed. Cognition and memory are impaired. He expresses impulsivity. He exhibits abnormal recent memory. He is inattentive.    Current Medications:   Current Outpatient Prescriptions  Medication Sig Dispense Refill  . aspirin 81 MG chewable tablet Chew 1 tablet (81 mg total) by mouth daily.    [name] donepezil (ARICEPT) 10 MG tablet Take 10 mg by mouth at bedtime.    [name] escitalopram (LEXAPRO) 20 MG tablet Take 20 mg by mouth every morning.     [name] levothyroxine (SYNTHROID, LEVOTHROID) 125 MCG tablet Take 125 mcg by mouth daily before breakfast.    . midodrine (PROAMATINE) 10 MG tablet Take 10 mg by mouth 2 (two) times daily.    . mirabegron ER (MYRBETRIQ) 50 MG TB24 tablet Take 1 tablet (50 mg total) by mouth every morning. 30 tablet 5  . pantoprazole (PROTONIX) 40 MG tablet TAKE ONE TABLET EVERY MORNING 30 tablet 4  . polyethylene glycol (MIRALAX / GLYCOLAX) packet Take 17 g by mouth at bedtime.    . potassium chloride SA (K-DUR,KLOR-CON) 20 MEQ tablet Take 40 mEq by mouth every morning.     [name]  temazepam (RESTORIL) 15 MG capsule Take 15 mg by mouth at bedtime as needed for sleep.    [name] torsemide (DEMADEX) 20 MG tablet Take 20-40 mg daily as needed for leg swelling 180 tablet 3  . ziprasidone (GEODON) 20 MG capsule Take 20 mg by mouth every morning.       Assessment:    Chronic Health Condition (CHF) - [name] [name] was discharged from the hospital 3 weeks ago after an admission for CHF. He still has  peripheral edema today R>L but it is improved. His weight has been stable within 2 pounds of 222 except for one episode of overnight gain  of 4pounds noted by [name] Husk PA at [facility] during his office visit last week. Ms. Bonnell [name] had him take extra Demadex and Potassium that day. She also told [name] [name] (caregiver) that extra Demadex could be given any time [name] [name] gained 2-3 pounds overnight.   [name] [name]'s caregivers are measuring his weight daily and know to call for weight gain of 3 pounds overnight or 5 pounds in a week or increased edema, cough, chest pain, or other new symptom. [name] [name]'s medications are being administered by his caregivers. [name] [name]'s daughter in law notes that he "seems to be peeing better" in response to his Demadex.   02/18/15 - [facility] new patient appointment  Chronic Health Condition (Dementia with worsening insomnia and confusion) - [name] [name] has had worsening confusion and insomnia, per his daughter in law's report, since his discharge from the hospital. I notified Dr. Hilma Favors last week who advised that [name] [name] should continue his newly prescribed Restoril and see if his symptoms improve. Today, [name] [name] reports that [name] [name] has been able to sleep longer periods through the night over the last few days and seems to be more oriented during the day and less jittery.   Chronic Health Condition (GI) - [name] [name] has history of significant reflux and has had several esophageal dilatations according to [place]. She notes today that [name] [name] seems to have less reflux over the last 2 days. [name] [name] is scheduled for esophagram tomorrow. [name] [name] will attend this procedure with him.   [name] [name] says [name] [name] complains that he "feels like he doesn't finish" when he has a bowel movement. She is to discuss this with GI and wonders if [name] [name] might benefit from a daily stool softener.   Plan:   [facility] CM Care Plan Problem One        Most Recent Value   Care Plan Problem One  Chronic Health Condition (CHF) with recent hospitalization   Role Documenting the Problem One  Care  Management Coordinator   Care Plan for Problem One  Active   [facility] Long Term Goal (31-90 days)  Over the next 31 days patient will not be admitted to the hospital for CHF   [facility] Long Term Goal Start Date  01/20/15   Interventions for Problem One Long Term Goal  Reviewed current patient status, plan of care with patient's daughter in law who is primary caregiver   [facility] CM Short Term Goal #1 (0-30 days)  Over the next 30 days patient and/or caregivers will verbalize understanding of signs and symptoms of volume overload and/or CHF exacerbation   [facility] CM Short Term Goal #1 Start Date  01/20/15   Interventions for Short Term Goal #1  Utilizing teachback method, discussed signs and symptoms of CHF exacerbation with patient's daughter in law/primary caregiver   [facility] CM  Short Term Goal #2 (0-30 days)  Over the next 30 days patient and/or caregivers will verbalize understanding of when to call provider for symptoms   [facility] CM Short Term Goal #2 Start Date  01/20/15   Interventions for Short Term Goal #2  Utilizing teachback method, discussed with primary caregiver when to call provider about symptoms,  explained that I would bring literature and blue [facility] Calendar with stop sign tool to assist with ongoing education during my initial home visit   [facility] CM Short Term Goal #3 (0-30 days)  Over the next 30 days, patient and/or caregivers will verbalize understanding of importance of dietary adhernece to control CHF symptoms   [facility] CM Short Term Goal #3 Start Date  01/20/15   Interventions for Short Tern Goal #3  Utilizing teachback method, reviewed with primary caregiver importance of dietary adherence for symptom management and details of prescribed diet    [facility] CM Care Plan Problem Two        Most Recent Value   Care Plan Problem Two  Acute/Chronic Health Condition ([place]) with worsening confusion and insomnia   Role Documenting the Problem Two  Care Management [place] for Problem Two  Active    Interventions for Problem Two Long Term Goal   Notified provider of worsening symptoms,  reivewed medications with patient/caregiver   [facility] Long Term Goal (31-90) days  Caregivers will verbalize understanding of plan of care to address worsening confusion and insomnia over the next 31 days   [facility] Long Term Goal Start Date  01/25/15   [facility] CM Short Term Goal #1 (0-30 days)  Over the next 3 days, patient's caregiver will discuss insomnia treatment plan with primary care provider   [facility] CM Short Term Goal #1 Start Date  01/25/15   Interventions for Short Term Goal #2   notified provider of worsening insomnia      [place] Care Management  [phone]

## 2015-02-02 ENCOUNTER — Ambulatory Visit (HOSPITAL_COMMUNITY)
Admission: RE | Admit: 2015-02-02 | Discharge: 2015-02-02 | Disposition: A | Discharge status: Home / Self Care | Payer: Medicare Other | Source: Ambulatory Visit | Attending: Internal Medicine | Admitting: Internal Medicine

## 2015-02-02 DIAGNOSIS — R1310 Dysphagia, unspecified: Secondary | ICD-10-CM

## 2015-02-03 DIAGNOSIS — F419 Anxiety disorder, unspecified: Secondary | ICD-10-CM | POA: Diagnosis not present

## 2015-02-03 DIAGNOSIS — Z8546 Personal history of malignant neoplasm of prostate: Secondary | ICD-10-CM | POA: Diagnosis not present

## 2015-02-03 DIAGNOSIS — Z85528 Personal history of other malignant neoplasm of kidney: Secondary | ICD-10-CM | POA: Diagnosis not present

## 2015-02-03 DIAGNOSIS — Z8673 Personal history of transient ischemic attack (TIA), and cerebral infarction without residual deficits: Secondary | ICD-10-CM | POA: Diagnosis not present

## 2015-02-03 DIAGNOSIS — I5043 Acute on chronic combined systolic (congestive) and diastolic (congestive) heart failure: Secondary | ICD-10-CM | POA: Diagnosis not present

## 2015-02-03 DIAGNOSIS — E1122 Type 2 diabetes mellitus with diabetic chronic kidney disease: Secondary | ICD-10-CM | POA: Diagnosis not present

## 2015-02-03 DIAGNOSIS — I2510 Atherosclerotic heart disease of native coronary artery without angina pectoris: Secondary | ICD-10-CM | POA: Diagnosis not present

## 2015-02-03 DIAGNOSIS — K219 Gastro-esophageal reflux disease without esophagitis: Secondary | ICD-10-CM | POA: Diagnosis not present

## 2015-02-03 DIAGNOSIS — F0390 Unspecified dementia without behavioral disturbance: Secondary | ICD-10-CM | POA: Diagnosis not present

## 2015-02-03 DIAGNOSIS — I82409 Acute embolism and thrombosis of unspecified deep veins of unspecified lower extremity: Secondary | ICD-10-CM | POA: Diagnosis not present

## 2015-02-03 DIAGNOSIS — Z8551 Personal history of malignant neoplasm of bladder: Secondary | ICD-10-CM | POA: Diagnosis not present

## 2015-02-03 DIAGNOSIS — N183 Chronic kidney disease, stage 3 (moderate): Secondary | ICD-10-CM | POA: Diagnosis not present

## 2015-02-03 DIAGNOSIS — G4733 Obstructive sleep apnea (adult) (pediatric): Secondary | ICD-10-CM | POA: Diagnosis not present

## 2015-02-09 ENCOUNTER — Other Ambulatory Visit: Payer: Self-pay | Admitting: *Deleted

## 2015-02-09 NOTE — Patient Outreach (Signed)
[facility]) Care Management   02/09/2015  [name] [name] Oct 30, 1933 [id number]  Devan Danzer Perotti is an 81 y.o. male who has had 4 hospital admissions this year in addition to 1 ED visit. Mr. Lincks was recently discharged from the hospital after a CHF exacerbation. He lives at home and has 24/7 caregivers including his daughter in law [place] who is his primary caregiver. Mrs. Langer is very knowledgeable and manages all of Mr. Goodin's care. She is the hands on caregiver M-W and private duty caregivers are present at all other times. Mr. Purnell has long term care insurance to help pay for these services. Mr. Weidinger recently saw his primary care provider - Dr. Sharilyn Sites and his nephrologist - Dr. Louis Meckel.  Subjective: "Today is an okay day" (dtr in law/primary caregiver)   "It doesn't make sense to spend the money on a meal if I'm going to spit it up" (patient)  Objective:  BP 136/80 mmHg  Pulse 71  Wt 224 lb (101.606 kg)  SpO2 94%  Review of Systems  Constitutional: Positive for malaise/fatigue.  HENT: Negative.   Eyes: Positive for blurred vision.       Patent complains of "smoky" vision "some days" per dtr in law/primary caregiver  Respiratory: Positive for cough.   Cardiovascular: Positive for leg swelling.       1+ bilateral lower extremity edema  Gastrointestinal: Positive for vomiting.       Significant regurgitation with any po ingestion  Genitourinary:       Urinary incontinence  Musculoskeletal: Positive for myalgias. Negative for falls.  Skin: Negative.   Neurological: Negative.   Psychiatric/Behavioral: Positive for memory loss. The patient has insomnia.        Insomnia improved   Significant short term memory loss related to dementia    Physical Exam  Constitutional: Vital signs are normal. He appears well-developed. He appears listless. He is sleeping. He is easily aroused. He has a sickly appearance.  Neck: No JVD present.  Cardiovascular:  Normal rate, regular rhythm and intact distal pulses.  Exam reveals gallop, S4 and decreased pulses.   Murmur heard. Respiratory: Effort normal. No respiratory distress. He has no rhonchi. He has no rales.  GI: Soft. Normal appearance and bowel sounds are normal. He exhibits no distension. There is no tenderness.  Neurological: He is easily aroused. He appears listless. He is disoriented. Gait abnormal.  Skin: Skin is warm, dry and intact.  Psychiatric: His affect is blunt. His speech is delayed. He is slowed and actively hallucinating. Thought content is not paranoid. Cognition and memory are impaired. He expresses impulsivity and inappropriate judgment. He exhibits abnormal recent memory.  Patient intermittently asks dtr in law to "let her out of the cabinet"; per dtr in law patient seems to believe a lady friend of his is in the living room stereo cabinet He is inattentive.    Current Medications:   Current Outpatient Prescriptions  Medication Sig Dispense Refill  . aspirin 81 MG chewable tablet Chew 1 tablet (81 mg total) by mouth daily.    [name] donepezil (ARICEPT) 10 MG tablet Take 10 mg by mouth at bedtime.    [name] escitalopram (LEXAPRO) 20 MG tablet Take 20 mg by mouth every morning.     [name] levothyroxine (SYNTHROID, LEVOTHROID) 125 MCG tablet Take 125 mcg by mouth daily before breakfast.    . midodrine (PROAMATINE) 10 MG tablet Take 10 mg by mouth 2 (two) times daily.    [name]  mirabegron ER (MYRBETRIQ) 50 MG TB24 tablet Take 1 tablet (50 mg total) by mouth every morning. 30 tablet 5  . pantoprazole (PROTONIX) 40 MG tablet TAKE ONE TABLET EVERY MORNING 30 tablet 4  . polyethylene glycol (MIRALAX / GLYCOLAX) packet Take 17 g by mouth at bedtime.    . potassium chloride SA (K-DUR,KLOR-CON) 20 MEQ tablet Take 40 mEq by mouth every morning.     . temazepam (RESTORIL) 15 MG capsule Take 15 mg by mouth at bedtime as needed for sleep.    [name] torsemide (DEMADEX) 20 MG tablet Take 20-40 mg daily as needed for  leg swelling 180 tablet 3  . ziprasidone (GEODON) 20 MG capsule Take 20 mg by mouth every morning.      No current facility-administered medications for this visit.    Assessment:    Chronic Health Condition (CHF) - Mr. Ventura was discharged from the hospital almost 4 weeks ago after an admission for CHF. His weight has been stable within 2 pounds of 222 except for two episodes of overnight gain of 4 pounds and 5 pounds. This past weekend, Mr. Cullens's weight reached 277. She says he became notably more short of breath and had increased swelling in his feet/legs. [name] advised the weekend caregiver to give extra Demadex as ordered and Mr. Mentink's weight returned to 223 by the next evening.    02/18/15 - [facility] new patient appointment  Chronic Health Condition (Dementia with worsening insomnia and confusion) - Mr. Hepler has had worsening confusion and insomnia, per his daughter in law's report, since his discharge from the hospital. I notified Dr. Hilma Favors 2 weeks ago. He advised that Mr. Dapper should continue his newly prescribed Restoril and see if his symptoms improve. Today, [name] reports that Mr. Ludwick has been able to sleep longer periods through the night and seems to be more oriented during the day and less jittery.   Chronic Health Condition (GI) - Mr. Kinker has history of significant reflux and has had several esophageal dilatations according to [place]. She notes today that Mr. Delucia seems to have less reflux over the last 2 days but EVERY time Mr. Apples eats or drinks, he has some degree of regurgitation. [name] feels this may contribute to volume overload at times. She states that Mr. Lamba either eats very little for several days or will eat "all day long". There is also concern that Mr. Mehlhaff's medications may not be digested/absorbed because of his significant and frequent regurgitation. Mr. Retz had an esophogram on 02/03/15 (see details/excerpt below):    IMPRESSION: Extensive tertiary contractions are seen involving the middle and distal esophagus consistent with presbyesophagus. Area persistent narrowing is seen in the distal esophagus with probable associated diverticulum. The patient reportedly has undergone multiple endoscopies and esophageal dilatations. It took approximately 2 minutes for barium tablet to pass through esophagus into stomach.   Electronically Signed  By: Marijo Conception, M.D.  On: 02/02/2015 09:13  [name] and her husband (Mr. Gammon's son) and Mr. Hain's other children are to discuss offered options - medical treatment vs esophageal dilitation.   Plan: I will see Mr. Asby at home on 02/22/15.   Upcoming Appointments: Podiatry 02/16/15 New CHF Clinic - 02/18/15 Opthalmology 02/24/15 Nephrology Louis Meckel) - 03/02/15 Cardiology Bronson Ing)  - 03/30/15   [facility] CM Care Plan Problem One        Most Recent Value   Care Plan Problem One  Chronic Health Condition (CHF) with recent hospitalization  Role Documenting the Problem One  Care Management Coordinator   Care Plan for Problem One  Active   [facility] Long Term Goal (31-90 days)  Over the next 31 days patient will not be admitted to the hospital for CHF   [facility] Long Term Goal Start Date  01/20/15   Interventions for Problem One Long Term Goal  Reviewed current patient status, plan of care with patient's daughter in law who is primary caregiver   [facility] CM Short Term Goal #1 (0-30 days)  Over the next 30 days patient and/or caregivers will verbalize understanding of signs and symptoms of volume overload and/or CHF exacerbation   [facility] CM Short Term Goal #1 Start Date  01/20/15   Interventions for Short Term Goal #1  Utilizing teachback method, discussed signs and symptoms of CHF exacerbation with patient's daughter in law/primary caregiver   [facility] CM Short Term Goal #2 (0-30 days)  Over the next 30 days patient and/or caregivers will verbalize understanding of when to  call provider for symptoms   [facility] CM Short Term Goal #2 Start Date  01/20/15   Interventions for Short Term Goal #2  Utilizing teachback method, discussed with primary caregiver when to call provider about symptoms,  explained that I would bring literature and blue [facility] Calendar with stop sign tool to assist with ongoing education during my initial home visit   [facility] CM Short Term Goal #3 (0-30 days)  Over the next 30 days, patient and/or caregivers will verbalize understanding of importance of dietary adhernece to control CHF symptoms   [facility] CM Short Term Goal #3 Start Date  01/20/15   Interventions for Short Tern Goal #3  Utilizing teachback method, reviewed with primary caregiver importance of dietary adherence for symptom management and details of prescribed diet    [facility] CM Care Plan Problem Two        Most Recent Value   Care Plan Problem Two  Acute/Chronic Health Condition (DEMENTIA) with worsening confusion and insomnia   Role Documenting the Problem Two  Care Management [place] for Problem Two  Active   Interventions for Problem Two Long Term Goal   Notified provider of worsening symptoms,  reivewed medications with patient/caregiver   [facility] Long Term Goal (31-90) days  Caregivers will verbalize understanding of plan of care to address worsening confusion and insomnia over the next 31 days   [facility] Long Term Goal Start Date  01/25/15   [facility] CM Short Term Goal #1 (0-30 days)  Over the next 3 days, patient's caregiver will discuss insomnia treatment plan with primary care provider   [facility] CM Short Term Goal #1 Start Date  01/25/15   Interventions for Short Term Goal #2   notified provider of worsening insomnia      [facility] Care Management  [phone]

## 2015-02-09 NOTE — Patient Instructions (Signed)
Esophageal Dilatation °Esophageal dilatation is a procedure to open a blocked or narrowed part of the esophagus. The esophagus is the long tube in your throat that carries food and liquid from your mouth to your stomach. The procedure is also called esophageal dilation.  °You may need this procedure if you have a buildup of scar tissue in your esophagus that makes it difficult, painful, or even impossible to swallow. This can be caused by gastroesophageal reflux disease (GERD). In rare cases, people need this procedure because they have cancer of the esophagus or a problem with the way food moves through the esophagus. Sometimes you may need to have another dilatation to enlarge the opening of the esophagus gradually. °LET YOUR HEALTH CARE PROVIDER KNOW ABOUT:  °· Any allergies you have. °· All medicines you are taking, including vitamins, herbs, eye drops, creams, and over-the-counter medicines. °· Previous problems you or members of your family have had with the use of anesthetics. °· Any blood disorders you have. °· Previous surgeries you have had. °· Medical conditions you have. °· Any antibiotic medicines you are required to take before dental procedures. °RISKS AND COMPLICATIONS °Generally, this is a safe procedure. However, problems can occur and include: °· Bleeding from a tear in the lining of the esophagus. °· A hole (perforation) in the esophagus. °BEFORE THE PROCEDURE °· Do not eat or drink anything after midnight on the night before the procedure or as directed by your health care provider. °· Ask your health care provider about changing or stopping your regular medicines. This is especially important if you are taking diabetes medicines or blood thinners. °· Plan to have someone take you home after the procedure. °PROCEDURE  °· You will be given a medicine that makes you relaxed and sleepy (sedative). °· A medicine may be sprayed or gargled to numb the back of the throat. °· Your health care provider  can use various instruments to do an esophageal dilatation. During the procedure, the instrument used will be placed in your mouth and passed down into your esophagus. Options include: °¨ Simple dilators. This instrument is carefully placed in the esophagus to stretch it. °¨ Guided wire bougies. In this method, a flexible tube (endoscope) is used to insert a wire into the esophagus. The dilator is passed over this wire to enlarge the esophagus. Then the wire is removed. °¨ Balloon dilators. An endoscope with a small balloon at the end is passed down into the esophagus. Inflating the balloon gently stretches the esophagus and opens it up. °AFTER THE PROCEDURE °· Your blood pressure, heart rate, breathing rate, and blood oxygen level will be monitored often until the medicines you were given have worn off. °· Your throat may feel slightly sore and will probably still feel numb. This will improve slowly over time. °· You will not be allowed to eat or drink until the throat numbness has resolved. °· If this is a same-day procedure, you may be allowed to go home once you have been able to drink, urinate, and sit on the edge of the bed without nausea or dizziness. °· If this is a same-day procedure, you should have a friend or family member with you for the next 24 hours after the procedure. °  °This information is not intended to replace advice given to you by your health care provider. Make sure you discuss any questions you have with your health care provider. °  °Document Released: 05/18/2005 Document Revised: 04/17/2014 Document Reviewed: 08/06/2013 °Elsevier   Interactive Patient Education ©2016 [employer] Inc. ° °

## 2015-02-10 ENCOUNTER — Other Ambulatory Visit (INDEPENDENT_AMBULATORY_CARE_PROVIDER_SITE_OTHER): Payer: Self-pay | Admitting: Internal Medicine

## 2015-02-10 DIAGNOSIS — R1319 Other dysphagia: Secondary | ICD-10-CM

## 2015-02-16 DIAGNOSIS — I739 Peripheral vascular disease, unspecified: Secondary | ICD-10-CM | POA: Diagnosis not present

## 2015-02-17 DIAGNOSIS — Z6833 Body mass index (BMI) 33.0-33.9, adult: Secondary | ICD-10-CM | POA: Diagnosis not present

## 2015-02-17 DIAGNOSIS — Z1389 Encounter for screening for other disorder: Secondary | ICD-10-CM | POA: Diagnosis not present

## 2015-02-17 DIAGNOSIS — R112 Nausea with vomiting, unspecified: Secondary | ICD-10-CM | POA: Diagnosis not present

## 2015-02-17 DIAGNOSIS — E6609 Other obesity due to excess calories: Secondary | ICD-10-CM | POA: Diagnosis not present

## 2015-02-18 ENCOUNTER — Ambulatory Visit (HOSPITAL_COMMUNITY)
Admission: RE | Admit: 2015-02-18 | Discharge: 2015-02-18 | Disposition: A | Discharge status: Home / Self Care | Payer: Medicare Other | Source: Ambulatory Visit | Attending: Cardiology | Admitting: Cardiology

## 2015-02-18 ENCOUNTER — Ambulatory Visit (HOSPITAL_BASED_OUTPATIENT_CLINIC_OR_DEPARTMENT_OTHER)
Admission: RE | Admit: 2015-02-18 | Discharge: 2015-02-18 | Disposition: A | Discharge status: Home / Self Care | Payer: Medicare Other | Source: Ambulatory Visit | Attending: Internal Medicine | Admitting: Internal Medicine

## 2015-02-18 VITALS — BP 112/70 | HR 85 | Wt 222.0 lb

## 2015-02-18 DIAGNOSIS — H540 Blindness, both eyes: Secondary | ICD-10-CM | POA: Diagnosis not present

## 2015-02-18 DIAGNOSIS — E039 Hypothyroidism, unspecified: Secondary | ICD-10-CM | POA: Diagnosis not present

## 2015-02-18 DIAGNOSIS — R1084 Generalized abdominal pain: Secondary | ICD-10-CM

## 2015-02-18 DIAGNOSIS — I2510 Atherosclerotic heart disease of native coronary artery without angina pectoris: Secondary | ICD-10-CM | POA: Diagnosis not present

## 2015-02-18 DIAGNOSIS — Z8546 Personal history of malignant neoplasm of prostate: Secondary | ICD-10-CM | POA: Diagnosis not present

## 2015-02-18 DIAGNOSIS — I4581 Long QT syndrome: Secondary | ICD-10-CM | POA: Diagnosis not present

## 2015-02-18 DIAGNOSIS — G4733 Obstructive sleep apnea (adult) (pediatric): Secondary | ICD-10-CM | POA: Diagnosis not present

## 2015-02-18 DIAGNOSIS — I509 Heart failure, unspecified: Secondary | ICD-10-CM | POA: Diagnosis not present

## 2015-02-18 DIAGNOSIS — Z8249 Family history of ischemic heart disease and other diseases of the circulatory system: Secondary | ICD-10-CM | POA: Diagnosis not present

## 2015-02-18 DIAGNOSIS — K224 Dyskinesia of esophagus: Secondary | ICD-10-CM | POA: Diagnosis not present

## 2015-02-18 DIAGNOSIS — Z905 Acquired absence of kidney: Secondary | ICD-10-CM

## 2015-02-18 DIAGNOSIS — Z85528 Personal history of other malignant neoplasm of kidney: Secondary | ICD-10-CM | POA: Diagnosis not present

## 2015-02-18 DIAGNOSIS — Z951 Presence of aortocoronary bypass graft: Secondary | ICD-10-CM | POA: Diagnosis not present

## 2015-02-18 DIAGNOSIS — R1319 Other dysphagia: Secondary | ICD-10-CM | POA: Diagnosis not present

## 2015-02-18 DIAGNOSIS — E876 Hypokalemia: Secondary | ICD-10-CM | POA: Diagnosis not present

## 2015-02-18 DIAGNOSIS — I5032 Chronic diastolic (congestive) heart failure: Secondary | ICD-10-CM

## 2015-02-18 DIAGNOSIS — E785 Hyperlipidemia, unspecified: Secondary | ICD-10-CM | POA: Diagnosis not present

## 2015-02-18 DIAGNOSIS — I951 Orthostatic hypotension: Secondary | ICD-10-CM

## 2015-02-18 DIAGNOSIS — R1310 Dysphagia, unspecified: Secondary | ICD-10-CM | POA: Diagnosis not present

## 2015-02-18 DIAGNOSIS — Z8551 Personal history of malignant neoplasm of bladder: Secondary | ICD-10-CM | POA: Diagnosis not present

## 2015-02-18 DIAGNOSIS — Z452 Encounter for adjustment and management of vascular access device: Secondary | ICD-10-CM | POA: Diagnosis not present

## 2015-02-18 DIAGNOSIS — N183 Chronic kidney disease, stage 3 unspecified: Secondary | ICD-10-CM

## 2015-02-18 DIAGNOSIS — F0391 Unspecified dementia with behavioral disturbance: Secondary | ICD-10-CM | POA: Diagnosis not present

## 2015-02-18 DIAGNOSIS — Z8673 Personal history of transient ischemic attack (TIA), and cerebral infarction without residual deficits: Secondary | ICD-10-CM | POA: Diagnosis not present

## 2015-02-18 DIAGNOSIS — E1122 Type 2 diabetes mellitus with diabetic chronic kidney disease: Secondary | ICD-10-CM | POA: Diagnosis not present

## 2015-02-18 DIAGNOSIS — K219 Gastro-esophageal reflux disease without esophagitis: Secondary | ICD-10-CM | POA: Diagnosis not present

## 2015-02-18 DIAGNOSIS — R110 Nausea: Secondary | ICD-10-CM | POA: Diagnosis not present

## 2015-02-18 DIAGNOSIS — Z7189 Other specified counseling: Secondary | ICD-10-CM | POA: Diagnosis not present

## 2015-02-18 DIAGNOSIS — Z515 Encounter for palliative care: Secondary | ICD-10-CM | POA: Diagnosis not present

## 2015-02-18 DIAGNOSIS — K449 Diaphragmatic hernia without obstruction or gangrene: Secondary | ICD-10-CM | POA: Diagnosis not present

## 2015-02-18 DIAGNOSIS — I5043 Acute on chronic combined systolic (congestive) and diastolic (congestive) heart failure: Secondary | ICD-10-CM | POA: Diagnosis not present

## 2015-02-18 DIAGNOSIS — K222 Esophageal obstruction: Secondary | ICD-10-CM | POA: Diagnosis not present

## 2015-02-18 DIAGNOSIS — I255 Ischemic cardiomyopathy: Secondary | ICD-10-CM | POA: Diagnosis not present

## 2015-02-18 DIAGNOSIS — I252 Old myocardial infarction: Secondary | ICD-10-CM | POA: Diagnosis not present

## 2015-02-18 DIAGNOSIS — R112 Nausea with vomiting, unspecified: Secondary | ICD-10-CM | POA: Diagnosis not present

## 2015-02-18 DIAGNOSIS — N179 Acute kidney failure, unspecified: Secondary | ICD-10-CM | POA: Diagnosis not present

## 2015-02-18 DIAGNOSIS — Z66 Do not resuscitate: Secondary | ICD-10-CM | POA: Diagnosis not present

## 2015-02-18 DIAGNOSIS — R109 Unspecified abdominal pain: Secondary | ICD-10-CM | POA: Diagnosis not present

## 2015-02-18 DIAGNOSIS — E870 Hyperosmolality and hypernatremia: Secondary | ICD-10-CM | POA: Diagnosis not present

## 2015-02-18 DIAGNOSIS — Z87891 Personal history of nicotine dependence: Secondary | ICD-10-CM | POA: Diagnosis not present

## 2015-02-18 DIAGNOSIS — N184 Chronic kidney disease, stage 4 (severe): Secondary | ICD-10-CM | POA: Diagnosis not present

## 2015-02-18 LAB — COMPREHENSIVE METABOLIC PANEL
ALT: 98 U/L — AB (ref 17–63)
AST: 137 U/L — AB (ref 15–41)
Albumin: 3.4 g/dL — ABNORMAL LOW (ref 3.5–5.0)
Alkaline Phosphatase: 66 U/L (ref 38–126)
Anion gap: 12 (ref 5–15)
BUN: 29 mg/dL — ABNORMAL HIGH (ref 6–20)
CHLORIDE: 105 mmol/L (ref 101–111)
CO2: 25 mmol/L (ref 22–32)
CREATININE: 2.3 mg/dL — AB (ref 0.61–1.24)
Calcium: 9.5 mg/dL (ref 8.9–10.3)
GFR calc non Af Amer: 25 mL/min — ABNORMAL LOW (ref >60)
GFR, EST AFRICAN AMERICAN: 29 mL/min — AB (ref >60)
Glucose, Bld: 121 mg/dL — ABNORMAL HIGH (ref 65–99)
Potassium: 4 mmol/L (ref 3.5–5.1)
SODIUM: 142 mmol/L (ref 135–145)
Total Bilirubin: 1.6 mg/dL — ABNORMAL HIGH (ref 0.3–1.2)
Total Protein: 6.6 g/dL (ref 6.5–8.1)

## 2015-02-18 LAB — CBC
HEMATOCRIT: 38.2 % — AB (ref 39.0–52.0)
HEMOGLOBIN: 11.2 g/dL — AB (ref 13.0–17.0)
MCH: 23.3 pg — ABNORMAL LOW (ref 26.0–34.0)
MCHC: 29.3 g/dL — ABNORMAL LOW (ref 30.0–36.0)
MCV: 79.4 fL (ref 78.0–100.0)
Platelets: 191 10*3/uL (ref 150–400)
RBC: 4.81 MIL/uL (ref 4.22–5.81)
RDW: 20.9 % — AB (ref 11.5–15.5)
WBC: 7.7 10*3/uL (ref 4.0–10.5)

## 2015-02-18 LAB — BRAIN NATRIURETIC PEPTIDE: B Natriuretic Peptide: 3201.1 pg/mL — ABNORMAL HIGH (ref 0.0–100.0)

## 2015-02-18 MED ORDER — TORSEMIDE 20 MG PO TABS: ORAL_TABLET | ORAL | Status: DC

## 2015-02-18 MED ORDER — MIDODRINE HCL 10 MG PO TABS: 10.0000 mg | ORAL_TABLET | Freq: Three times a day (TID) | ORAL | Status: DC

## 2015-02-18 MED ORDER — POTASSIUM CHLORIDE CRYS ER 20 MEQ PO TBCR: EXTENDED_RELEASE_TABLET | ORAL | Status: DC

## 2015-02-18 NOTE — Patient Instructions (Signed)
Stop Florinef  Increase Midodrine to 10 mg Three times a day   Increase Torsemide to 40 mg (2 tabs) Twice daily FOR 3 DAYS, THEN   Take 40 mg (2 tabs) in AM and 20 mg (1 tab) in PM  Increase Potassium to 40 meq (2 tabs) in AM and 20 meq (1 tab) in PM  Labs today  Your physician has requested that you have an echocardiogram. Echocardiography is a painless test that uses sound waves to create images of your heart. It provides your doctor with information about the size and shape of your heart and how well your heart's chambers and valves are working. This procedure takes approximately one hour. There are no restrictions for this procedure.  Abdominal Ultrasound  Your physician recommends that you schedule a follow-up appointment in: 10 days

## 2015-02-18 NOTE — Progress Notes (Signed)
Patient ID: KALEN DREES, male   DOB: 03/07/1934, 81 y.o.   MRN: [id number] PCP: Dr. Hilma Favors Cardiology: Dr. Jacinta Shoe HF Cardiology: Dr. Aundra Dubin  81 yo with history of CAD s/p CABG, ischemic cardiomyopathy/systolic CHF, CKD, orthostatic hypotension, recent DVT, and history of prostate, cancer and bladder cancers presents for CHF clinic evaluation.  He had CABG in 9/15.  EF was 40-45% on last echo in 5/16.  CHF has been difficult to manage given orthostatic hypotension.  He has been on Florinef and midodrine since his CABG.  BP today is ok and he denies recent orthostatic symptoms.  He has had no chest pain.  He has had significant exertional dyspnea.  He is short of breath just walking around the house.  Mild orthopnea.  No falls.    Main complaint today is actually no exertional dyspnea.  For 3 days, he has been unable to eat.  He has been drinking fluid.  He has a history of esophageal dysmotility and is now getting nauseated and regurgitating food. He has mild peri-umbilical abdominal pain.  Symptoms are worse after eating and he has been frequently gagging.   Labs (9/16): HCT 31.9 Labs (10/16): K 3.9, creatinine 2.11  ECG: NSR, RBBB  PMH: 1. CAD: CABG x 4 in 9/15 (LIMA-LAD, SVG-D, SVG-OM, SVG-PDA).  2. Hyperlipidemia 3. Depression 4. Prostate cancer  5. Kidney cancer s/p nephrectomy (has single kidney now).  6. Bladder cancer 7. Type II diabetes 8. H/o CVA 9. Right left DVT: IVC filter 6/16.  No anticoagulation with hematuria in setting of bladder cancer as well as hemo+ stool.  10. CKD stage III-IV with solitary kidney. 11. Orthostatic hypotension 12. Mild dementia 13. GERD 14. Hypothyroidism 15. Chronic systolic CHF: Ischemic cardiomyopathy.  Echo (5/16) with EF 40-45%, mild LV dilation, inferior/inferolateral hypokinesis, moderate MR, mildly dilated RV with moderate to severely decreased systolic function, PASP 48 mmHg.  67. OSA  SH: Lives in [place], daughter nearby,  nonsmoker, no ETOH.   FH: No premature CAD  ROS: All systems reviewed and negative except as per HPI.  Current Outpatient Prescriptions  Medication Sig Dispense Refill  . aspirin 81 MG chewable tablet Chew 1 tablet (81 mg total) by mouth daily.    [name] donepezil (ARICEPT) 10 MG tablet Take 10 mg by mouth at bedtime.    [name] escitalopram (LEXAPRO) 20 MG tablet Take 20 mg by mouth every morning.     [name] levothyroxine (SYNTHROID, LEVOTHROID) 125 MCG tablet Take 125 mcg by mouth daily before breakfast.    . midodrine (PROAMATINE) 10 MG tablet Take 1 tablet (10 mg total) by mouth 3 (three) times daily. 90 tablet 3  . mirabegron ER (MYRBETRIQ) 50 MG TB24 tablet Take 1 tablet (50 mg total) by mouth every morning. 30 tablet 5  . pantoprazole (PROTONIX) 40 MG tablet TAKE ONE TABLET EVERY MORNING 30 tablet 4  . polyethylene glycol (MIRALAX / GLYCOLAX) packet Take 17 g by mouth at bedtime.    . potassium chloride SA (K-DUR,KLOR-CON) 20 MEQ tablet Take 2 tabs in AM and 1 tab in PM 90 tablet 3  . temazepam (RESTORIL) 15 MG capsule Take 15 mg by mouth at bedtime as needed for sleep.    [name] torsemide (DEMADEX) 20 MG tablet Take 2 tabs in AM and 1 tab in PM 90 tablet 3  . ziprasidone (GEODON) 20 MG capsule Take 20 mg by mouth every morning.      No current facility-administered medications for this encounter.  BP 112/70 mmHg  Pulse 85  Wt 222 lb (100.699 kg)  SpO2 91% General: NAD.  Chronically ill-appearing.  Neck: JVP 14-15 cm, no thyromegaly or thyroid nodule.  Lungs: Clear to auscultation bilaterally with normal respiratory effort. CV: Nondisplaced PMI.  Heart regular S1/S2, no S3/S4, 3/6 HSM at apex.  1+ edema 1/2 to knees bilaterally.  No carotid bruit.  Normal pedal pulses.  Abdomen: Soft, nontender, no hepatosplenomegaly, mild distention.  Skin: Intact without lesions or rashes.  Neurologic: Alert and oriented x 3.  Psych: Normal affect. Extremities: No clubbing or cyanosis.  HEENT: Normal.    Assessment/Plan: 1. Abdominal pain/nausea/regurgitating: Patient has prominent GI symptoms today.  He has been unable to eat x 3 days, though he is taking in fluid.  It is possible that these symptoms could arise from volume overload/RV failure, but it does sound to me like a primary GI issue. Symptoms are prominent after eating. He has a history of esophageal dysmotility and has had similar symptoms in the past.  - I will arrange for abdominal US to rule out cholecystitis. - He has followup next week with his gastroenterologist and will have EGD.  - Check CMET and lipase today.  2. Chronic systolic CHF: Ischemic cardiomyopathy, EF 40-45%. On exam today, he is very volume overloaded. Management will be more difficult given GI symptoms and poor po intake.  We do not want to overdiurese him in the setting of poor po intake.   - Increase torsemide to 40 mg bid x 3 days then 40 qam/20 qpm.  BMET in 10 days.  - He has been unable tolerate other cardiac meds due to orthostatic hypotension. 3. Orthostatic hypotension: He has been on Florinef and midodrine.  I would rather not have a patient with CHF on Florinef. I will have him stop Florinef and increase midodrine to 10 mg tid from bid.  4. DVT: Has IVC filter.  Not anticoagulated because of hematuria and heme+ stool.   5. CKD: BMET today.  6. CAD: s/p CABG.  No chest pain. Continue ASA 81.  He is not on a statin due to myalgias.  Will have to check with him at next appointment to see which statins he has taken.   Loralie Champagne 02/18/2015

## 2015-02-19 ENCOUNTER — Ambulatory Visit (HOSPITAL_COMMUNITY): Payer: Medicare Other

## 2015-02-20 ENCOUNTER — Observation Stay (HOSPITAL_COMMUNITY): Payer: Medicare Other

## 2015-02-20 ENCOUNTER — Inpatient Hospital Stay (HOSPITAL_COMMUNITY)
Admission: EM | Admit: 2015-02-20 | Discharge: 2015-03-02 | DRG: 391 | Disposition: A | Discharge status: Hospice | Payer: Medicare Other | Attending: Internal Medicine | Admitting: Internal Medicine

## 2015-02-20 ENCOUNTER — Encounter (HOSPITAL_COMMUNITY): Payer: Self-pay

## 2015-02-20 DIAGNOSIS — R1310 Dysphagia, unspecified: Secondary | ICD-10-CM

## 2015-02-20 DIAGNOSIS — E1122 Type 2 diabetes mellitus with diabetic chronic kidney disease: Secondary | ICD-10-CM | POA: Diagnosis present

## 2015-02-20 DIAGNOSIS — I509 Heart failure, unspecified: Secondary | ICD-10-CM

## 2015-02-20 DIAGNOSIS — K224 Dyskinesia of esophagus: Principal | ICD-10-CM | POA: Diagnosis present

## 2015-02-20 DIAGNOSIS — Z87891 Personal history of nicotine dependence: Secondary | ICD-10-CM

## 2015-02-20 DIAGNOSIS — I2510 Atherosclerotic heart disease of native coronary artery without angina pectoris: Secondary | ICD-10-CM | POA: Diagnosis present

## 2015-02-20 DIAGNOSIS — Z8249 Family history of ischemic heart disease and other diseases of the circulatory system: Secondary | ICD-10-CM

## 2015-02-20 DIAGNOSIS — E785 Hyperlipidemia, unspecified: Secondary | ICD-10-CM | POA: Diagnosis present

## 2015-02-20 DIAGNOSIS — N179 Acute kidney failure, unspecified: Secondary | ICD-10-CM | POA: Diagnosis present

## 2015-02-20 DIAGNOSIS — K222 Esophageal obstruction: Secondary | ICD-10-CM | POA: Diagnosis not present

## 2015-02-20 DIAGNOSIS — Z7189 Other specified counseling: Secondary | ICD-10-CM | POA: Insufficient documentation

## 2015-02-20 DIAGNOSIS — E876 Hypokalemia: Secondary | ICD-10-CM | POA: Diagnosis present

## 2015-02-20 DIAGNOSIS — R1319 Other dysphagia: Secondary | ICD-10-CM | POA: Diagnosis present

## 2015-02-20 DIAGNOSIS — Z85528 Personal history of other malignant neoplasm of kidney: Secondary | ICD-10-CM

## 2015-02-20 DIAGNOSIS — I252 Old myocardial infarction: Secondary | ICD-10-CM

## 2015-02-20 DIAGNOSIS — E039 Hypothyroidism, unspecified: Secondary | ICD-10-CM | POA: Diagnosis present

## 2015-02-20 DIAGNOSIS — Z8673 Personal history of transient ischemic attack (TIA), and cerebral infarction without residual deficits: Secondary | ICD-10-CM

## 2015-02-20 DIAGNOSIS — Z452 Encounter for adjustment and management of vascular access device: Secondary | ICD-10-CM | POA: Diagnosis not present

## 2015-02-20 DIAGNOSIS — H540 Blindness, both eyes: Secondary | ICD-10-CM | POA: Diagnosis not present

## 2015-02-20 DIAGNOSIS — K219 Gastro-esophageal reflux disease without esophagitis: Secondary | ICD-10-CM | POA: Diagnosis present

## 2015-02-20 DIAGNOSIS — Z8546 Personal history of malignant neoplasm of prostate: Secondary | ICD-10-CM

## 2015-02-20 DIAGNOSIS — I4581 Long QT syndrome: Secondary | ICD-10-CM | POA: Diagnosis not present

## 2015-02-20 DIAGNOSIS — E119 Type 2 diabetes mellitus without complications: Secondary | ICD-10-CM

## 2015-02-20 DIAGNOSIS — I5043 Acute on chronic combined systolic (congestive) and diastolic (congestive) heart failure: Secondary | ICD-10-CM | POA: Diagnosis present

## 2015-02-20 DIAGNOSIS — K449 Diaphragmatic hernia without obstruction or gangrene: Secondary | ICD-10-CM | POA: Diagnosis present

## 2015-02-20 DIAGNOSIS — F0391 Unspecified dementia with behavioral disturbance: Secondary | ICD-10-CM | POA: Diagnosis present

## 2015-02-20 DIAGNOSIS — Z905 Acquired absence of kidney: Secondary | ICD-10-CM

## 2015-02-20 DIAGNOSIS — Z8551 Personal history of malignant neoplasm of bladder: Secondary | ICD-10-CM

## 2015-02-20 DIAGNOSIS — N184 Chronic kidney disease, stage 4 (severe): Secondary | ICD-10-CM | POA: Diagnosis present

## 2015-02-20 DIAGNOSIS — Z951 Presence of aortocoronary bypass graft: Secondary | ICD-10-CM

## 2015-02-20 DIAGNOSIS — I5032 Chronic diastolic (congestive) heart failure: Secondary | ICD-10-CM | POA: Diagnosis not present

## 2015-02-20 DIAGNOSIS — H547 Unspecified visual loss: Secondary | ICD-10-CM | POA: Diagnosis present

## 2015-02-20 DIAGNOSIS — Z66 Do not resuscitate: Secondary | ICD-10-CM | POA: Diagnosis present

## 2015-02-20 DIAGNOSIS — E870 Hyperosmolality and hypernatremia: Secondary | ICD-10-CM | POA: Diagnosis not present

## 2015-02-20 DIAGNOSIS — I255 Ischemic cardiomyopathy: Secondary | ICD-10-CM | POA: Diagnosis present

## 2015-02-20 DIAGNOSIS — R112 Nausea with vomiting, unspecified: Secondary | ICD-10-CM

## 2015-02-20 DIAGNOSIS — G4733 Obstructive sleep apnea (adult) (pediatric): Secondary | ICD-10-CM | POA: Diagnosis present

## 2015-02-20 DIAGNOSIS — R9431 Abnormal electrocardiogram [ECG] [EKG]: Secondary | ICD-10-CM | POA: Diagnosis present

## 2015-02-20 LAB — CBC WITH DIFFERENTIAL/PLATELET
BASOS ABS: 0 10*3/uL (ref 0.0–0.1)
Basophils Relative: 0 %
EOS ABS: 0 10*3/uL (ref 0.0–0.7)
Eosinophils Relative: 0 %
HCT: 40.4 % (ref 39.0–52.0)
HEMOGLOBIN: 11.8 g/dL — AB (ref 13.0–17.0)
LYMPHS ABS: 0.9 10*3/uL (ref 0.7–4.0)
Lymphocytes Relative: 11 %
MCH: 23.3 pg — AB (ref 26.0–34.0)
MCHC: 29.2 g/dL — ABNORMAL LOW (ref 30.0–36.0)
MCV: 79.7 fL (ref 78.0–100.0)
Monocytes Absolute: 0.9 10*3/uL (ref 0.1–1.0)
Monocytes Relative: 10 %
NEUTROS PCT: 79 %
Neutro Abs: 6.5 10*3/uL (ref 1.7–7.7)
PLATELETS: 203 10*3/uL (ref 150–400)
RBC: 5.07 MIL/uL (ref 4.22–5.81)
RDW: 20.8 % — ABNORMAL HIGH (ref 11.5–15.5)
WBC: 8.3 10*3/uL (ref 4.0–10.5)

## 2015-02-20 LAB — URINALYSIS, ROUTINE W REFLEX MICROSCOPIC
BILIRUBIN URINE: NEGATIVE
Glucose, UA: NEGATIVE mg/dL
Ketones, ur: NEGATIVE mg/dL
Nitrite: NEGATIVE
Protein, ur: NEGATIVE mg/dL
SPECIFIC GRAVITY, URINE: 1.01 (ref 1.005–1.030)
Urobilinogen, UA: 0.2 mg/dL (ref 0.0–1.0)
pH: 6.5 (ref 5.0–8.0)

## 2015-02-20 LAB — COMPREHENSIVE METABOLIC PANEL
ALBUMIN: 3.4 g/dL — AB (ref 3.5–5.0)
ALK PHOS: 71 U/L (ref 38–126)
ALT: 129 U/L — ABNORMAL HIGH (ref 17–63)
AST: 143 U/L — AB (ref 15–41)
Anion gap: 10 (ref 5–15)
BUN: 39 mg/dL — AB (ref 6–20)
CALCIUM: 9.3 mg/dL (ref 8.9–10.3)
CHLORIDE: 104 mmol/L (ref 101–111)
CO2: 29 mmol/L (ref 22–32)
CREATININE: 2.31 mg/dL — AB (ref 0.61–1.24)
GFR calc non Af Amer: 25 mL/min — ABNORMAL LOW (ref >60)
GFR, EST AFRICAN AMERICAN: 29 mL/min — AB (ref >60)
GLUCOSE: 105 mg/dL — AB (ref 65–99)
Potassium: 3.8 mmol/L (ref 3.5–5.1)
SODIUM: 143 mmol/L (ref 135–145)
Total Bilirubin: 1.5 mg/dL — ABNORMAL HIGH (ref 0.3–1.2)
Total Protein: 6.5 g/dL (ref 6.5–8.1)

## 2015-02-20 LAB — GLUCOSE, CAPILLARY
GLUCOSE-CAPILLARY: 84 mg/dL (ref 65–99)
Glucose-Capillary: 80 mg/dL (ref 65–99)

## 2015-02-20 LAB — URINE MICROSCOPIC-ADD ON

## 2015-02-20 LAB — TSH: TSH: 6.693 u[IU]/mL — ABNORMAL HIGH (ref 0.350–4.500)

## 2015-02-20 MED ORDER — SODIUM CHLORIDE 0.9 % IJ SOLN: 10.0000 mL | INTRAMUSCULAR | Status: DC | PRN

## 2015-02-20 MED ORDER — PROMETHAZINE HCL 25 MG/ML IJ SOLN: 12.5000 mg | Freq: Four times a day (QID) | INTRAMUSCULAR | Status: DC | PRN

## 2015-02-20 MED ORDER — PANTOPRAZOLE SODIUM 40 MG IV SOLR
40.0000 mg | INTRAVENOUS | Status: DC
  Administered 2015-02-20: 40 mg via INTRAVENOUS
  Filled 2015-02-20: qty 40

## 2015-02-20 MED ORDER — SODIUM CHLORIDE 0.9 % IJ SOLN
10.0000 mL | Freq: Two times a day (BID) | INTRAMUSCULAR | Status: DC
  Administered 2015-02-20 – 2015-02-22 (×5): 10 mL
  Administered 2015-02-22: 20 mL
  Administered 2015-02-23 – 2015-03-02 (×10): 10 mL

## 2015-02-20 MED ORDER — HEPARIN SODIUM (PORCINE) 5000 UNIT/ML IJ SOLN
5000.0000 [IU] | Freq: Three times a day (TID) | INTRAMUSCULAR | Status: DC
  Administered 2015-02-20 – 2015-02-21 (×2): 5000 [IU] via SUBCUTANEOUS
  Filled 2015-02-20 (×2): qty 1

## 2015-02-20 MED ORDER — DEXTROSE-NACL 5-0.9 % IV SOLN
INTRAVENOUS | Status: DC
Start: 2015-02-20 — End: 2015-02-24
  Administered 2015-02-20: 21:00:00 via INTRAVENOUS
  Administered 2015-02-20 – 2015-02-21 (×2): 1 mL via INTRAVENOUS
  Administered 2015-02-22 – 2015-02-24 (×4): via INTRAVENOUS

## 2015-02-20 MED ORDER — LEVOTHYROXINE SODIUM 100 MCG IV SOLR
75.0000 ug | Freq: Every day | INTRAVENOUS | Status: DC
  Administered 2015-02-21 – 2015-03-02 (×10): 75 ug via INTRAVENOUS
  Filled 2015-02-20 (×12): qty 5

## 2015-02-20 MED ORDER — SODIUM CHLORIDE 0.9 % IV SOLN
Freq: Once | INTRAVENOUS | Status: AC
  Administered 2015-02-20: 500 mL via INTRAVENOUS

## 2015-02-20 MED ORDER — SODIUM CHLORIDE 0.9 % IJ SOLN
3.0000 mL | Freq: Two times a day (BID) | INTRAMUSCULAR | Status: DC
Start: 2015-02-20 — End: 2015-03-02
  Administered 2015-02-20 – 2015-03-02 (×5): 3 mL via INTRAVENOUS

## 2015-02-20 MED ORDER — FUROSEMIDE 10 MG/ML IJ SOLN
40.0000 mg | Freq: Two times a day (BID) | INTRAMUSCULAR | Status: DC
  Administered 2015-02-20: 40 mg via INTRAVENOUS
  Filled 2015-02-20: qty 4

## 2015-02-20 MED ORDER — LORAZEPAM 1 MG PO TABS
1.0000 mg | ORAL_TABLET | Freq: Once | ORAL | Status: AC
  Administered 2015-02-20: 1 mg via ORAL
  Filled 2015-02-20: qty 1

## 2015-02-20 MED ORDER — SODIUM CHLORIDE 0.9 % IV SOLN: INTRAVENOUS | Status: AC

## 2015-02-20 MED ORDER — MORPHINE SULFATE (PF) 2 MG/ML IV SOLN
2.0000 mg | INTRAVENOUS | Status: DC | PRN
  Administered 2015-02-25 – 2015-03-01 (×6): 2 mg via INTRAVENOUS
  Filled 2015-02-20 (×6): qty 1

## 2015-02-20 MED ORDER — INSULIN ASPART 100 UNIT/ML ~~LOC~~ SOLN
0.0000 [IU] | Freq: Three times a day (TID) | SUBCUTANEOUS | Status: DC
  Administered 2015-02-23 – 2015-02-28 (×3): 2 [IU] via SUBCUTANEOUS

## 2015-02-20 MED ORDER — LORAZEPAM 2 MG/ML IJ SOLN
1.0000 mg | INTRAMUSCULAR | Status: DC | PRN
  Administered 2015-02-20 – 2015-03-02 (×22): 1 mg via INTRAVENOUS
  Filled 2015-02-20 (×24): qty 1

## 2015-02-20 MED ORDER — FUROSEMIDE 10 MG/ML IJ SOLN
40.0000 mg | Freq: Once | INTRAMUSCULAR | Status: AC
Start: 2015-02-20 — End: 2015-02-20
  Administered 2015-02-20: 40 mg via INTRAVENOUS
  Filled 2015-02-20: qty 4

## 2015-02-20 MED ORDER — HALOPERIDOL LACTATE 5 MG/ML IJ SOLN
2.0000 mg | Freq: Once | INTRAMUSCULAR | Status: AC
Start: 2015-02-20 — End: 2015-02-20
  Administered 2015-02-20: 2 mg via INTRAVENOUS
  Filled 2015-02-20: qty 1

## 2015-02-20 NOTE — Progress Notes (Signed)
Peripherally Inserted Central Catheter/Midline Placement  The IV Nurse has discussed with the patient and/or persons authorized to consent for the patient, the purpose of this procedure and the potential benefits and risks involved with this procedure.  The benefits include less needle sticks, lab draws from the catheter and patient may be discharged home with the catheter.  Risks include, but not limited to, infection, bleeding, blood clot (thrombus formation), and puncture of an artery; nerve damage and irregular heat beat.  Alternatives to this procedure were also discussed.  PICC/Midline Placement Documentation  PICC / Midline Single Lumen [id number] PICC Right Basilic 38 cm 0 cm (Active)  Indication for Insertion or Continuance of Line Limited venous access - need for IV therapy >5 days (PICC only) 02/20/2015  8:26 PM  Exposed Catheter (cm) 0 cm 02/20/2015  8:26 PM  Site Assessment Clean;Dry;Intact 02/20/2015  8:26 PM  Line Status Flushed;Blood return noted 02/20/2015  8:26 PM  Dressing Type Transparent;Securing device 02/20/2015  8:26 PM  Dressing Status Clean;Dry;Intact;Antimicrobial disc in place 02/20/2015  8:26 PM  Line Care Connections checked and tightened 02/20/2015  8:26 PM  Dressing Intervention New dressing 02/20/2015  8:26 PM  Dressing Change Due 02/27/15 02/20/2015  8:26 PM       Hillery Jacks 02/20/2015, 8:28 PM

## 2015-02-20 NOTE — ED Notes (Signed)
Pt reports nausea and vomiting x 4 days.  Denies diarrhea, lbm was Tuesday.  Reports had a swallow study recently and is scheduled to have esophagus stretched next Thursday.

## 2015-02-20 NOTE — ED Notes (Signed)
3rd floor nurse in another room, will call back shortly for report.

## 2015-02-20 NOTE — ED Provider Notes (Signed)
CSN: [id number]     Arrival date & time 02/20/15  [id number] History  By signing my name below, I, Arianna Nassar, attest that this documentation has been prepared under the direction and in the presence of Daleen Bo, MD. Electronically Signed: Julien Nordmann, ED Scribe. 02/20/2015. 10:24 AM.    Chief Complaint  Patient presents with  . Emesis     The history is provided by the patient. No language interpreter was used.   HPI Comments: [name] [name] is a 81 y.o. male who has a pertinent PMHx presents to the Emergency Department complaining of constant, gradual worsening emesis onset 4 days ago. Pt has been having a productive cough with "cream" sputum and nausea. He states he is unable to keep food, liquid or water down due to the emesis. Relatives notes pt states he is unable to keep food down since Tuesday. Pt has not had anything by mouth since 6:30 last night. Pt is not ambulatory at the time. Pt has had a stretching of his esophagus several times over the past few years and has one scheduled next Thursday. He denies abdominal pain.  Past Medical History  Diagnosis Date  . Chronic constipation   . GERD (gastroesophageal reflux disease)   . Hyperlipidemia   . Orthostatic hypotension 11/19/2013  . Coronary artery disease     a. 12/2013 - STEMI s/p CABGx4.  . Mild dementia   . Hypothyroidism   . History of esophageal dilatation   . History of gastritis   . History of hiatal hernia   . History of bladder cancer     hx  TCC low grade  . Kidney tumor     right  . BPH (benign prostatic hyperplasia)   . CKD (chronic kidney disease) stage 3, GFR 30-59 ml/min     a. h/o nephrectomy.  [name] History of kidney stones   . ED (erectile dysfunction)   . S/P CABG x 4     12-18-2013  . Bladder tumor   . DVT (deep venous thrombosis) ([place])     a. s/p IVC filter 09/2014. Xarelto stopped at that time due to hematuria and hemoccult + stools.  . Myocardial infarction ([place]) 12/2013  . Stroke [facility])      TIA  . Chronic combined systolic and diastolic CHF (congestive heart failure) (HCC)     systolic and diastolic heart failure  . Depression   . Hematuria   . Heme positive stool   . Anemia   . Shortness of breath dyspnea     non change in status per dtr-in-low depends on weather and activity   . Type 2 diabetes mellitus (HCC)     borderline on no medications   . Anxiety   . Prostate cancer ([place])     low-grade and bladder cancer and kidney cancer   . OSA on CPAP     mild osa   per sleep study 07-11-2009- patient states does not use regularly   Past Surgical History  Procedure Laterality Date  . Upper gastrointestinal endoscopy  04/06/2010  &  01-08-2013    w/ esophageal dilatation  . Hemorrhoid surgery    . Hand surgery      right  . Wrist fracture surgery      pins placed-left  . Cystoscopy with biopsy  01/09/2012    Procedure: CYSTOSCOPY WITH BIOPSY;  Surgeon: Marissa Nestle, MD;  Location: [facility] ORS;  Service: Urology;  Laterality: N/A;  Cystoscopy with Multiple Bladder Biopsies  .  Esophagogastroduodenoscopy (egd) with esophageal dilation N/A 01/08/2013    Procedure: ESOPHAGOGASTRODUODENOSCOPY (EGD) WITH ESOPHAGEAL DILATION;  Surgeon: Rogene Houston, MD;  Location: [facility] ENDO SUITE;  Service: Endoscopy;  Laterality: N/A;  120  . Left heart cath N/A 12/17/2013    Procedure: LEFT HEART CATH;  Surgeon: Leonie Man, MD;  Location: [facility] CATH LAB;  Service: Cardiovascular;  Laterality: N/A;  . Left heart catheterization with coronary angiogram N/A 12/17/2013    Procedure: LEFT HEART CATHETERIZATION WITH CORONARY ANGIOGRAM;  Surgeon: Leonie Man, MD;  Location: [facility] CATH LAB;  Service: Cardiovascular;  Laterality: N/A;  . Colonoscopy with esophagogastroduodenoscopy (egd)  08-15-2006    w/ esophageal dilatation  . Umbilical hernia repair  04-24-2003  . Transurethral resection of bladder tumor  06-14-2004  . Transurethral resection of prostate  10-04-2004  . Cysto/  removal bladder  calculus/  resection bladder tumor  10-16-2006  . Cystostomy w/ bladder biopsy  01-09-2012  . Cardiac catheterization  12-17-2013   dr Shanon Brow harding    severe disease RCA with thrombotic appearing subtotal occlusions/  95-99%  OM2/  diffuse disease LAD  and D1/  mild to moderate basal to mid inferior hypokinesis/  severe elevated LVEDP/  ef 45-50%  . Coronary artery bypass graft N/A 12/18/2013    Procedure: Coronary artery bypass grafting times four using left internal mammary artery and endoscopically harvested right saphenous vein graft;  Surgeon: Gaye Pollack, MD;  Location: [facility] OR;  Service: Open Heart Surgery;  Laterality: N/A;  . Transthoracic echocardiogram  11-14-2012    grade I diastolic dysfunction/  ef 55-60%/  trivial MR  and TR  . Cardiovascular stress test  08-09-2009  dr berry    mild to moderate perfusion defect in basal , mid, and apical inferior regions consistent with an infart/scar/   No evidence ischemia/  ef 51%/  no ECG changes/   low risk scan  . Transurethral resection of bladder tumor N/A 04/07/2014    Procedure: TRANSURETHRAL RESECTION OF BLADDER TUMOR (TURBT);  Surgeon: Arvil Persons, MD;  Location: [facility];  Service: Urology;  Laterality: N/A;  . Cystoscopy/retrograde/ureteroscopy Right 04/07/2014    Procedure: CYSTOSCOPY/RETROGRADE/URETEROSCOPY WITH BIOPSY OF RENAL PELVIS TUMOR;  Surgeon: Arvil Persons, MD;  Location: [facility];  Service: Urology;  Laterality: Right;  . Robot assisted laparoscopic nephrectomy Right 06/05/2014    Procedure: ROBOTIC ASSISTED LAPAROSCOPIC RIGHT NEPHROURETERECTOMY, VENTRAL HERNIA REPAIR ;  Surgeon: Ardis Hughs, MD;  Location: [facility] ORS;  Service: Urology;  Laterality: Right;  . Cystoscopy w/ ureteral stent placement Right 06/05/2014    Procedure: CYSTOSCOPY ;  Surgeon: Ardis Hughs, MD;  Location: [facility] ORS;  Service: Urology;  Laterality: Right;  . Radiology with anesthesia N/A 06/22/2014    Procedure:  MRI BRAIN W/O CONTRAST;  Surgeon: Medication Radiologist, MD;  Location: [place];  Service: Radiology;  Laterality: N/A;  . Eye surgery      cataract surgery  . Hernia repair  [id number]    umbilical hernia repair at time of nephrectomy  . Right kidney removal     . Ivc filter placement ([facility] hx)  09/17/2014   . Transurethral resection of bladder tumor N/A 12/24/2014    Procedure: TRANSURETHRAL RESECTION OF BLADDER TUMOR (TURBT);  Surgeon: Ardis Hughs, MD;  Location: [facility] ORS;  Service: Urology;  Laterality: N/A;  . Nephrectomy    . Bypass graft     Family History  Problem Relation Age of Onset  .  Cancer - Lung Brother   . Heart disease Mother   . Heart attack Mother    Social History  Substance Use Topics  . Smoking status: Former Smoker -- 1.00 packs/day for 4 years    Types: Cigarettes    Start date: 09/08/1952    Quit date: 09/09/1959  . Smokeless tobacco: Former Systems developer    Types: [place] date: 01/03/1960  . Alcohol Use: No    Review of Systems  Respiratory: Positive for cough.   Gastrointestinal: Positive for nausea and vomiting. Negative for abdominal pain.  All other systems reviewed and are negative.     Allergies  Lorazepam and Statins  Home Medications   Prior to Admission medications   Medication Sig Start Date End Date Taking? Authorizing Provider  aspirin 81 MG chewable tablet Chew 1 tablet (81 mg total) by mouth daily. 08/20/14  Yes Lezlie Octave Black, NP  donepezil (ARICEPT) 10 MG tablet Take 10 mg by mouth at bedtime.   Yes Historical Provider, MD  escitalopram (LEXAPRO) 20 MG tablet Take 20 mg by mouth every morning.    Yes Historical Provider, MD  levothyroxine (SYNTHROID, LEVOTHROID) 125 MCG tablet Take 125 mcg by mouth daily before breakfast.   Yes Historical Provider, MD  midodrine (PROAMATINE) 10 MG tablet Take 1 tablet (10 mg total) by mouth 3 (three) times daily. 02/18/15  Yes Larey Dresser, MD  mirabegron ER (MYRBETRIQ) 50 MG TB24 tablet Take 1  tablet (50 mg total) by mouth every morning. 12/25/14  Yes Ardis Hughs, MD  ondansetron (ZOFRAN-ODT) 4 MG disintegrating tablet Take 4 mg by mouth every 8 (eight) hours as needed for nausea or vomiting.   Yes Historical Provider, MD  pantoprazole (PROTONIX) 40 MG tablet TAKE ONE TABLET EVERY MORNING 04/06/14  Yes Butch Penny, NP  polyethylene glycol (MIRALAX / GLYCOLAX) packet Take 17 g by mouth at bedtime.   Yes Historical Provider, MD  potassium chloride SA (K-DUR,KLOR-CON) 20 MEQ tablet Take 2 tabs in AM and 1 tab in PM 02/18/15  Yes Larey Dresser, MD  temazepam (RESTORIL) 15 MG capsule Take 15 mg by mouth at bedtime as needed for sleep.   Yes Sharilyn Sites, MD  torsemide [facility]) 20 MG tablet Take 2 tabs in AM and 1 tab in PM 02/18/15  Yes Larey Dresser, MD  ziprasidone (GEODON) 20 MG capsule Take 20 mg by mouth every morning.  09/15/14  Yes Historical Provider, MD   Triage vitals: BP 141/97 mmHg  Pulse 79  Temp(Src) 97.7 F (36.5 C) (Oral)  Resp 18  Ht 5\' 10"  (1.778 m)  Wt 215 lb (97.523 kg)  BMI 30.85 kg/m2  SpO2 96% Physical Exam  Constitutional: He is oriented to person, place, and time. He appears well-developed and well-nourished.  HENT:  Head: Normocephalic and atraumatic.  Right Ear: External ear normal.  Left Ear: External ear normal.  Eyes: Conjunctivae and EOM are normal. Pupils are equal, round, and reactive to light.  Neck: Normal range of motion and phonation normal. Neck supple.  Cardiovascular: Normal rate, regular rhythm and normal heart sounds.   Pulmonary/Chest: Effort normal and breath sounds normal. He exhibits no bony tenderness.  Abdominal: Soft. There is no tenderness.  Musculoskeletal: Normal range of motion.  Neurological: He is alert and oriented to person, place, and time. No cranial nerve deficit or sensory deficit. He exhibits normal muscle tone. Coordination normal.  Skin: Skin is warm, dry and intact.  Psychiatric:  He has a normal mood  and affect. His behavior is normal. Judgment and thought content normal.  Nursing note and vitals reviewed.   ED Course  Procedures  DIAGNOSTIC STUDIES: Oxygen Saturation is 96% on RA, adequate by my interpretation.  COORDINATION OF CARE:  10:20 AM Discussed treatment plan which includes lab work, IV fluids and chest x-ray with pt at bedside and pt agreed to plan.  Medications  0.9 %  sodium chloride infusion ( Intravenous Stopped 02/20/15 1159)  0.9 %  sodium chloride infusion (500 mLs Intravenous New Bag/Given 02/20/15 1122)  haloperidol lactate (HALDOL) injection 2 mg (2 mg Intravenous Given 02/20/15 1251)  furosemide (LASIX) injection 40 mg (40 mg Intravenous Given 02/20/15 1251)    Patient Vitals for the past 24 hrs:  BP Temp Temp src Pulse Resp SpO2 Height Weight  02/20/15 1259 146/100 mmHg - - 79 20 95 % - -  02/20/15 1051 131/93 mmHg - - 69 18 96 % - -  02/20/15 0946 141/97 mmHg 97.7 F (36.5 C) Oral 79 18 96 % 5\' 10"  (1.778 m) 215 lb (97.523 kg)    1:06 PM Reevaluation with update and discussion. After initial assessment and treatment, an updated evaluation reveals patient became somewhat agitated, begging for food and water, and his caregiver felt like he needed medications. He has been unable to tolerate his usual medications at home. She also states that he is not allergic to lorazepam, and uses intermittently home without difficulty. Therefore, this medication will be removed from his medication list. He will be treated with Haldol, for agitation, since he is unable to take his oral antipsychotic medications. His caregiver was updated on the findings. Damita Eppard L   13:15- consult to GI to see if they would like to intervene in his care today, since he has an upcoming esophageal dilatation scheduled.  Prior Barium swallow results: IMPRESSION: Extensive tertiary contractions are seen involving the middle and distal esophagus consistent with presbyesophagus. Area  persistent narrowing is seen in the distal esophagus with probable associated diverticulum. The patient reportedly has undergone multiple endoscopies and esophageal dilatations. It took approximately 2 minutes for barium tablet to pass through esophagus into stomach.   Electronically Signed  By: Marijo Conception, M.D.  On: 02/02/2015 09:13   1:28 PM-Consult complete with Hospitalist. Patient case explained and discussed. He agrees to admit patient for further evaluation and treatment. Call ended at 13:45  Labs Review Labs Reviewed  CBC WITH DIFFERENTIAL/PLATELET - Abnormal; Notable for the following:    Hemoglobin 11.8 (*)    MCH 23.3 (*)    MCHC 29.2 (*)    RDW 20.8 (*)    All other components within normal limits  COMPREHENSIVE METABOLIC PANEL - Abnormal; Notable for the following:    Glucose, Bld 105 (*)    BUN 39 (*)    Creatinine, Ser 2.31 (*)    Albumin 3.4 (*)    AST 143 (*)    ALT 129 (*)    Total Bilirubin 1.5 (*)    GFR calc non Af Amer 25 (*)    GFR calc Af Amer 29 (*)    All other components within normal limits  URINALYSIS, ROUTINE W REFLEX MICROSCOPIC (NOT AT [facility])    Imaging Review US Abdomen Complete  02/18/2015  CLINICAL DATA:  Generalized abdominal pain and nausea for 3 days. History of right-sided nephrectomy for renal cell carcinoma. History of prostate cancer. EXAM: ULTRASOUND ABDOMEN COMPLETE COMPARISON:  CT abdomen pelvis - 09/18/2014  FINDINGS: Gallbladder: Sonographically normal. No echogenic gallstones or gall sludge. No gallbladder wall thickening or pericholecystic fluid. Negative sonographic Murphy's sign. Common bile duct: Diameter: Normal in size measuring 6.5 mm in diameter. Liver: Homogeneous hepatic echotexture. No discrete hepatic lesions. No definite evidence of intrahepatic biliary ductal dilatation. Interval development of a small to moderate amount of intra-abdominal ascites. The portal vein appears patent were imaged (image 41). IVC: No  abnormality visualized. Pancreas: Limited visualization of the pancreatic head and neck is normal. Visualization of the pancreatic body and tail is obscured by bowel gas. Spleen: Normal in size measuring 4.9 cm in length Right Kidney:  Surgically absent Left Kidney: Normal cortical thickness, echogenicity and size, measuring 11.5 cm in length. No focal renal lesions. No echogenic renal stones. No urinary obstruction. Abdominal aorta: No aneurysm visualized. Other findings: None. IMPRESSION: Interval development of a small to moderate amount of intra-abdominal ascites, the etiology of which is not depicted on this examination. The portal vein appears patent where imaged. No evidence splenomegaly. Electronically Signed   By: Sandi Mariscal M.D.   On: 02/18/2015 15:28   I have personally reviewed and evaluated these images and lab results as part of my medical decision-making.   EKG Interpretation None      MDM   Final diagnoses:  Non-intractable vomiting with nausea, vomiting of unspecified type  Esophageal stricture   Inability to tolerate oral nutrition, and medications. This is likely secondary to progressive esophageal stricture, which is planned for dilatation on 02/25/2015. Case has been discussed with GI, who does not want to proceed with urgent dilatation today. He will require admission for stabilization, treatment, and likely semi-elective esophageal dilatation.  Nursing Notes Reviewed/ Care Coordinated Applicable Imaging Reviewed Interpretation of Laboratory Data incorporated into ED treatment   I personally performed the services described in this documentation, which was scribed in my presence. The recorded information has been reviewed and is accurate.     Daleen Bo, MD 02/20/15 [phone]

## 2015-02-20 NOTE — H&P (Signed)
[facility] History and Physical  [name] [name] [id number] DOB: Aug 28, 1933 DOA: 02/20/2015  Referring physician: Emergency Department PCP: Purvis Kilts, MD  Specialists:   Chief Complaint: Dysphagia  HPI: [name] [name] is a 81 y.o. male with a hx of esophageal stricture requiring dilation, diastolic CHF, CAD s/p CABGx4, HLD, DM2 who presents to the ED with nausea and inability to tolerate PO intake. Pt was seen by Cardiology on 11/10 for same issues. Abd [place] was ordered to evaluate for GI cause of symptoms. Pt was noted to be markedly volume overloaded with ascites present. Patient was advised to increase diuretics to torsemide 40mg  bid x 3days then 40mg  daily. Pt's symptoms worsened and he was unable to tolerate PO. Pt subsequently presented to the ED. GI was called with recommendations for inpatient admission for stabilization of pt. Patient was given one dose of IV lasix. Hospitalist consulted for admission.  Review of Systems:  Review of Systems  Constitutional: Negative for fever and chills.  HENT: Negative for ear pain, hearing loss and tinnitus.   Eyes: Negative for pain and discharge.  Respiratory: Negative for hemoptysis and wheezing.   Cardiovascular: Negative for chest pain and claudication.  Gastrointestinal: Positive for nausea. Negative for diarrhea.  Genitourinary: Negative for frequency and flank pain.  Musculoskeletal: Negative for joint pain and neck pain.  Neurological: Negative for tingling, tremors and loss of consciousness.  Psychiatric/Behavioral: Negative for hallucinations and memory loss.     Past Medical History  Diagnosis Date  . Chronic constipation   . GERD (gastroesophageal reflux disease)   . Hyperlipidemia   . Orthostatic hypotension 11/19/2013  . Coronary artery disease     a. 12/2013 - STEMI s/p CABGx4.  . Mild dementia   . Hypothyroidism   . History of esophageal dilatation   . History of gastritis   . History of hiatal hernia    . History of bladder cancer     hx  TCC low grade  . Kidney tumor     right  . BPH (benign prostatic hyperplasia)   . CKD (chronic kidney disease) stage 3, GFR 30-59 ml/min     a. h/o nephrectomy.  [name] History of kidney stones   . ED (erectile dysfunction)   . S/P CABG x 4     12-18-2013  . Bladder tumor   . DVT (deep venous thrombosis) ([place])     a. s/p IVC filter 09/2014. Xarelto stopped at that time due to hematuria and hemoccult + stools.  . Myocardial infarction ([place]) 12/2013  . Stroke [facility])     TIA  . Chronic combined systolic and diastolic CHF (congestive heart failure) (HCC)     systolic and diastolic heart failure  . Depression   . Hematuria   . Heme positive stool   . Anemia   . Shortness of breath dyspnea     non change in status per dtr-in-low depends on weather and activity   . Type 2 diabetes mellitus (HCC)     borderline on no medications   . Anxiety   . Prostate cancer ([place])     low-grade and bladder cancer and kidney cancer   . OSA on CPAP     mild osa   per sleep study 07-11-2009- patient states does not use regularly   Past Surgical History  Procedure Laterality Date  . Upper gastrointestinal endoscopy  04/06/2010  &  01-08-2013    w/ esophageal dilatation  . Hemorrhoid surgery    .  Hand surgery      right  . Wrist fracture surgery      pins placed-left  . Cystoscopy with biopsy  01/09/2012    Procedure: CYSTOSCOPY WITH BIOPSY;  Surgeon: Marissa Nestle, MD;  Location: [facility] ORS;  Service: Urology;  Laterality: N/A;  Cystoscopy with Multiple Bladder Biopsies  . Esophagogastroduodenoscopy (egd) with esophageal dilation N/A 01/08/2013    Procedure: ESOPHAGOGASTRODUODENOSCOPY (EGD) WITH ESOPHAGEAL DILATION;  Surgeon: Rogene Houston, MD;  Location: [facility] ENDO SUITE;  Service: Endoscopy;  Laterality: N/A;  120  . Left heart cath N/A 12/17/2013    Procedure: LEFT HEART CATH;  Surgeon: Leonie Man, MD;  Location: [facility] CATH LAB;  Service: Cardiovascular;   Laterality: N/A;  . Left heart catheterization with coronary angiogram N/A 12/17/2013    Procedure: LEFT HEART CATHETERIZATION WITH CORONARY ANGIOGRAM;  Surgeon: Leonie Man, MD;  Location: [facility] CATH LAB;  Service: Cardiovascular;  Laterality: N/A;  . Colonoscopy with esophagogastroduodenoscopy (egd)  08-15-2006    w/ esophageal dilatation  . Umbilical hernia repair  04-24-2003  . Transurethral resection of bladder tumor  06-14-2004  . Transurethral resection of prostate  10-04-2004  . Cysto/  removal bladder calculus/  resection bladder tumor  10-16-2006  . Cystostomy w/ bladder biopsy  01-09-2012  . Cardiac catheterization  12-17-2013   dr Shanon Brow harding    severe disease RCA with thrombotic appearing subtotal occlusions/  95-99%  OM2/  diffuse disease LAD  and D1/  mild to moderate basal to mid inferior hypokinesis/  severe elevated LVEDP/  ef 45-50%  . Coronary artery bypass graft N/A 12/18/2013    Procedure: Coronary artery bypass grafting times four using left internal mammary artery and endoscopically harvested right saphenous vein graft;  Surgeon: Gaye Pollack, MD;  Location: [facility] OR;  Service: Open Heart Surgery;  Laterality: N/A;  . Transthoracic echocardiogram  11-14-2012    grade I diastolic dysfunction/  ef 55-60%/  trivial MR  and TR  . Cardiovascular stress test  08-09-2009  dr berry    mild to moderate perfusion defect in basal , mid, and apical inferior regions consistent with an infart/scar/   No evidence ischemia/  ef 51%/  no ECG changes/   low risk scan  . Transurethral resection of bladder tumor N/A 04/07/2014    Procedure: TRANSURETHRAL RESECTION OF BLADDER TUMOR (TURBT);  Surgeon: Arvil Persons, MD;  Location: [facility];  Service: Urology;  Laterality: N/A;  . Cystoscopy/retrograde/ureteroscopy Right 04/07/2014    Procedure: CYSTOSCOPY/RETROGRADE/URETEROSCOPY WITH BIOPSY OF RENAL PELVIS TUMOR;  Surgeon: Arvil Persons, MD;  Location: [facility];   Service: Urology;  Laterality: Right;  . Robot assisted laparoscopic nephrectomy Right 06/05/2014    Procedure: ROBOTIC ASSISTED LAPAROSCOPIC RIGHT NEPHROURETERECTOMY, VENTRAL HERNIA REPAIR ;  Surgeon: Ardis Hughs, MD;  Location: [facility] ORS;  Service: Urology;  Laterality: Right;  . Cystoscopy w/ ureteral stent placement Right 06/05/2014    Procedure: CYSTOSCOPY ;  Surgeon: Ardis Hughs, MD;  Location: [facility] ORS;  Service: Urology;  Laterality: Right;  . Radiology with anesthesia N/A 06/22/2014    Procedure: MRI BRAIN W/O CONTRAST;  Surgeon: Medication Radiologist, MD;  Location: [facility];  Service: Radiology;  Laterality: N/A;  . Eye surgery      cataract surgery  . Hernia repair  [id number]    umbilical hernia repair at time of nephrectomy  . Right kidney removal     . Ivc filter placement ([facility] hx)  09/17/2014   . Transurethral resection of bladder tumor N/A 12/24/2014    Procedure: TRANSURETHRAL RESECTION OF BLADDER TUMOR (TURBT);  Surgeon: Ardis Hughs, MD;  Location: [facility] ORS;  Service: Urology;  Laterality: N/A;  . Nephrectomy    . Bypass graft     Social History:  reports that he quit smoking about 55 years ago. His smoking use included Cigarettes. He started smoking about 62 years ago. He has a 4 pack-year smoking history. He quit smokeless tobacco use about 55 years ago. His smokeless tobacco use included Chew. He reports that he does not drink alcohol or use illicit drugs.  where does patient live--home, ALF, SNF? and with whom if at home?  Can patient participate in ADLs?  Allergies  Allergen Reactions  . Statins Other (See Comments)    MUSCLE ACHES    Family History  Problem Relation Age of Onset  . Cancer - Lung Brother   . Heart disease Mother   . Heart attack Mother      Prior to Admission medications   Medication Sig Start Date End Date Taking? Authorizing Provider  aspirin 81 MG chewable tablet Chew 1 tablet (81 mg total) by mouth daily. 08/20/14  Yes Lezlie Octave  Black, NP  donepezil (ARICEPT) 10 MG tablet Take 10 mg by mouth at bedtime.   Yes Historical Provider, MD  escitalopram (LEXAPRO) 20 MG tablet Take 20 mg by mouth every morning.    Yes Historical Provider, MD  levothyroxine (SYNTHROID, LEVOTHROID) 125 MCG tablet Take 125 mcg by mouth daily before breakfast.   Yes Historical Provider, MD  midodrine (PROAMATINE) 10 MG tablet Take 1 tablet (10 mg total) by mouth 3 (three) times daily. 02/18/15  Yes Larey Dresser, MD  mirabegron ER (MYRBETRIQ) 50 MG TB24 tablet Take 1 tablet (50 mg total) by mouth every morning. 12/25/14  Yes Ardis Hughs, MD  ondansetron (ZOFRAN-ODT) 4 MG disintegrating tablet Take 4 mg by mouth every 8 (eight) hours as needed for nausea or vomiting.   Yes Historical Provider, MD  pantoprazole (PROTONIX) 40 MG tablet TAKE ONE TABLET EVERY MORNING 04/06/14  Yes Butch Penny, NP  polyethylene glycol (MIRALAX / GLYCOLAX) packet Take 17 g by mouth at bedtime.   Yes Historical Provider, MD  potassium chloride SA (K-DUR,KLOR-CON) 20 MEQ tablet Take 2 tabs in AM and 1 tab in PM 02/18/15  Yes Larey Dresser, MD  temazepam (RESTORIL) 15 MG capsule Take 15 mg by mouth at bedtime as needed for sleep.   Yes Sharilyn Sites, MD  torsemide [facility]) 20 MG tablet Take 2 tabs in AM and 1 tab in PM 02/18/15  Yes Larey Dresser, MD  ziprasidone (GEODON) 20 MG capsule Take 20 mg by mouth every morning.  09/15/14  Yes Historical Provider, MD   Physical Exam: Filed Vitals:   02/20/15 0946 02/20/15 1051 02/20/15 1259  BP: 141/97 131/93 146/100  Pulse: 79 69 79  Temp: 97.7 F (36.5 C)    TempSrc: Oral    Resp: 18 18 20   Height: 5\' 10"  (1.778 m)    Weight: 97.523 kg (215 lb)    SpO2: 96% 96% 95%     General:  Awake, in nad  Eyes: PERRL B  ENT: membranes moist, dentition fair  Neck: trachea midline, neck supple  Cardiovascular: regular, s1, s2  Respiratory: normal resp effort, no wheezing  Abdomen: soft,nondistended  Skin:  normal skin turgor, no abnormal skin lesions seen  Musculoskeletal: perfused, no  clubbing  Psychiatric: mood/affect normal// no auditory/visual hallucinations  Neurologic: decreased vision//strength/sensation intact  Labs on Admission:  Basic Metabolic Panel:  Recent Labs Lab 02/18/15 1245 02/20/15 1013  NA 142 143  K 4.0 3.8  CL 105 104  CO2 25 29  GLUCOSE 121* 105*  BUN 29* 39*  CREATININE 2.30* 2.31*  CALCIUM 9.5 9.3   Liver Function Tests:  Recent Labs Lab 02/18/15 1245 02/20/15 1013  AST 137* 143*  ALT 98* 129*  ALKPHOS 66 71  BILITOT 1.6* 1.5*  PROT 6.6 6.5  ALBUMIN 3.4* 3.4*   No results for input(s): LIPASE, AMYLASE in the last 168 hours. No results for input(s): AMMONIA in the last 168 hours. CBC:  Recent Labs Lab 02/18/15 1245 02/20/15 1013  WBC 7.7 8.3  NEUTROABS  --  6.5  HGB 11.2* 11.8*  HCT 38.2* 40.4  MCV 79.4 79.7  PLT 191 203   Cardiac Enzymes: No results for input(s): CKTOTAL, CKMB, CKMBINDEX, TROPONINI in the last 168 hours.  BNP (last 3 results)  Recent Labs  09/18/14 0130 01/04/15 2140 02/18/15 1245  BNP 2240.0* 2464.0* 3201.1*    ProBNP (last 3 results) No results for input(s): PROBNP in the last 8760 hours.  CBG: No results for input(s): GLUCAP in the last 168 hours.  Radiological Exams on Admission: US Abdomen Complete  02/18/2015  CLINICAL DATA:  Generalized abdominal pain and nausea for 3 days. History of right-sided nephrectomy for renal cell carcinoma. History of prostate cancer. EXAM: ULTRASOUND ABDOMEN COMPLETE COMPARISON:  CT abdomen pelvis - 09/18/2014 FINDINGS: Gallbladder: Sonographically normal. No echogenic gallstones or gall sludge. No gallbladder wall thickening or pericholecystic fluid. Negative sonographic Murphy's sign. Common bile duct: Diameter: Normal in size measuring 6.5 mm in diameter. Liver: Homogeneous hepatic echotexture. No discrete hepatic lesions. No definite evidence of intrahepatic biliary  ductal dilatation. Interval development of a small to moderate amount of intra-abdominal ascites. The portal vein appears patent were imaged (image 41). IVC: No abnormality visualized. Pancreas: Limited visualization of the pancreatic head and neck is normal. Visualization of the pancreatic body and tail is obscured by bowel gas. Spleen: Normal in size measuring 4.9 cm in length Right Kidney:  Surgically absent Left Kidney: Normal cortical thickness, echogenicity and size, measuring 11.5 cm in length. No focal renal lesions. No echogenic renal stones. No urinary obstruction. Abdominal aorta: No aneurysm visualized. Other findings: None. IMPRESSION: Interval development of a small to moderate amount of intra-abdominal ascites, the etiology of which is not depicted on this examination. The portal vein appears patent where imaged. No evidence splenomegaly. Electronically Signed   By: Sandi Mariscal M.D.   On: 02/18/2015 15:28    Assessment/Plan Principal Problem:   Dysphagia Active Problems:   Hyperlipidemia   S/P CABG x 4   Prolonged Q-T interval on ECG   DM type 2 (diabetes mellitus, type 2) (HCC)   Blindness   Chronic diastolic CHF (congestive heart failure) (HCC)   Esophageal stricture   1. Dysphagia secondary to esophageal stricture 1. Pt with known stricture, already planned for outpt dilation on 11/17, however, pt is unable to tolerate PO meds or nutrition 2. GI called in ED with recs to admit and stabilize and dilation performed later 3. Keep NPO for now with dextrose IVF 4. Admit to med-tele 2. Acute on chronic diastolic CHF 1. Recently seen by Cardiology for volume overload 2. Will cont on IV lasix for now 3. Monitor daily wts and i/o 3. DM2 1. Cont on SSI  coverage 4. Blindness 1. Stable 5. CAD s/p CABG x4 1. Stable 2. No chest pain 6. Prolonged QT syndrome 1. Most recent ekg with QTc of just under 460 2. Would avoid meds which would increase QTc 7. DVT prophylaxis 1. Heparin  subQ 8. Hypothyroid 1. On 179mcg of levothyroxine prior to admit 2. Will cont on 31mcg IV  Code Status: DNR Family Communication: Pt in room Disposition Plan: [place], [place] Hospital[facility] Pager [phone]  If 7PM-7AM, please contact night-coverage [email or web address] Password [email or web address] 02/20/2015, 1:56 PM

## 2015-02-21 ENCOUNTER — Encounter (HOSPITAL_COMMUNITY): Payer: Self-pay | Admitting: Gastroenterology

## 2015-02-21 DIAGNOSIS — E870 Hyperosmolality and hypernatremia: Secondary | ICD-10-CM | POA: Diagnosis not present

## 2015-02-21 DIAGNOSIS — I509 Heart failure, unspecified: Secondary | ICD-10-CM | POA: Diagnosis not present

## 2015-02-21 DIAGNOSIS — R1310 Dysphagia, unspecified: Secondary | ICD-10-CM | POA: Diagnosis not present

## 2015-02-21 DIAGNOSIS — E039 Hypothyroidism, unspecified: Secondary | ICD-10-CM | POA: Diagnosis present

## 2015-02-21 DIAGNOSIS — N179 Acute kidney failure, unspecified: Secondary | ICD-10-CM | POA: Diagnosis not present

## 2015-02-21 DIAGNOSIS — K219 Gastro-esophageal reflux disease without esophagitis: Secondary | ICD-10-CM | POA: Diagnosis present

## 2015-02-21 DIAGNOSIS — R1319 Other dysphagia: Secondary | ICD-10-CM | POA: Diagnosis present

## 2015-02-21 DIAGNOSIS — I255 Ischemic cardiomyopathy: Secondary | ICD-10-CM | POA: Diagnosis present

## 2015-02-21 DIAGNOSIS — I5043 Acute on chronic combined systolic (congestive) and diastolic (congestive) heart failure: Secondary | ICD-10-CM | POA: Diagnosis not present

## 2015-02-21 DIAGNOSIS — F0390 Unspecified dementia without behavioral disturbance: Secondary | ICD-10-CM | POA: Diagnosis not present

## 2015-02-21 DIAGNOSIS — Z8551 Personal history of malignant neoplasm of bladder: Secondary | ICD-10-CM | POA: Diagnosis not present

## 2015-02-21 DIAGNOSIS — I4581 Long QT syndrome: Secondary | ICD-10-CM | POA: Diagnosis not present

## 2015-02-21 DIAGNOSIS — I5032 Chronic diastolic (congestive) heart failure: Secondary | ICD-10-CM | POA: Diagnosis not present

## 2015-02-21 DIAGNOSIS — F0391 Unspecified dementia with behavioral disturbance: Secondary | ICD-10-CM | POA: Diagnosis present

## 2015-02-21 DIAGNOSIS — E785 Hyperlipidemia, unspecified: Secondary | ICD-10-CM | POA: Diagnosis present

## 2015-02-21 DIAGNOSIS — E1122 Type 2 diabetes mellitus with diabetic chronic kidney disease: Secondary | ICD-10-CM | POA: Diagnosis not present

## 2015-02-21 DIAGNOSIS — R451 Restlessness and agitation: Secondary | ICD-10-CM | POA: Diagnosis not present

## 2015-02-21 DIAGNOSIS — K222 Esophageal obstruction: Secondary | ICD-10-CM | POA: Diagnosis not present

## 2015-02-21 DIAGNOSIS — I252 Old myocardial infarction: Secondary | ICD-10-CM | POA: Diagnosis not present

## 2015-02-21 DIAGNOSIS — Z8249 Family history of ischemic heart disease and other diseases of the circulatory system: Secondary | ICD-10-CM | POA: Diagnosis not present

## 2015-02-21 DIAGNOSIS — Z8673 Personal history of transient ischemic attack (TIA), and cerebral infarction without residual deficits: Secondary | ICD-10-CM | POA: Diagnosis not present

## 2015-02-21 DIAGNOSIS — R531 Weakness: Secondary | ICD-10-CM | POA: Diagnosis not present

## 2015-02-21 DIAGNOSIS — Z905 Acquired absence of kidney: Secondary | ICD-10-CM | POA: Diagnosis not present

## 2015-02-21 DIAGNOSIS — Z66 Do not resuscitate: Secondary | ICD-10-CM | POA: Diagnosis present

## 2015-02-21 DIAGNOSIS — Z743 Need for continuous supervision: Secondary | ICD-10-CM | POA: Diagnosis not present

## 2015-02-21 DIAGNOSIS — K449 Diaphragmatic hernia without obstruction or gangrene: Secondary | ICD-10-CM | POA: Diagnosis not present

## 2015-02-21 DIAGNOSIS — Z8546 Personal history of malignant neoplasm of prostate: Secondary | ICD-10-CM | POA: Diagnosis not present

## 2015-02-21 DIAGNOSIS — N184 Chronic kidney disease, stage 4 (severe): Secondary | ICD-10-CM | POA: Diagnosis present

## 2015-02-21 DIAGNOSIS — Z951 Presence of aortocoronary bypass graft: Secondary | ICD-10-CM | POA: Diagnosis not present

## 2015-02-21 DIAGNOSIS — G4733 Obstructive sleep apnea (adult) (pediatric): Secondary | ICD-10-CM | POA: Diagnosis present

## 2015-02-21 DIAGNOSIS — Z85528 Personal history of other malignant neoplasm of kidney: Secondary | ICD-10-CM | POA: Diagnosis not present

## 2015-02-21 DIAGNOSIS — H540 Blindness, both eyes: Secondary | ICD-10-CM | POA: Diagnosis not present

## 2015-02-21 DIAGNOSIS — K224 Dyskinesia of esophagus: Secondary | ICD-10-CM | POA: Diagnosis not present

## 2015-02-21 DIAGNOSIS — Z87891 Personal history of nicotine dependence: Secondary | ICD-10-CM | POA: Diagnosis not present

## 2015-02-21 DIAGNOSIS — E876 Hypokalemia: Secondary | ICD-10-CM | POA: Diagnosis present

## 2015-02-21 DIAGNOSIS — R112 Nausea with vomiting, unspecified: Secondary | ICD-10-CM | POA: Diagnosis not present

## 2015-02-21 DIAGNOSIS — Z539 Procedure and treatment not carried out, unspecified reason: Secondary | ICD-10-CM | POA: Diagnosis not present

## 2015-02-21 DIAGNOSIS — Z7189 Other specified counseling: Secondary | ICD-10-CM | POA: Diagnosis not present

## 2015-02-21 DIAGNOSIS — I2510 Atherosclerotic heart disease of native coronary artery without angina pectoris: Secondary | ICD-10-CM | POA: Diagnosis present

## 2015-02-21 DIAGNOSIS — Z515 Encounter for palliative care: Secondary | ICD-10-CM | POA: Diagnosis not present

## 2015-02-21 LAB — COMPREHENSIVE METABOLIC PANEL
ALBUMIN: 3 g/dL — AB (ref 3.5–5.0)
ALK PHOS: 59 U/L (ref 38–126)
ALT: 112 U/L — ABNORMAL HIGH (ref 17–63)
ANION GAP: 9 (ref 5–15)
AST: 108 U/L — AB (ref 15–41)
BILIRUBIN TOTAL: 1.2 mg/dL (ref 0.3–1.2)
BUN: 36 mg/dL — AB (ref 6–20)
CO2: 33 mmol/L — AB (ref 22–32)
Calcium: 8.7 mg/dL — ABNORMAL LOW (ref 8.9–10.3)
Chloride: 106 mmol/L (ref 101–111)
Creatinine, Ser: 2.14 mg/dL — ABNORMAL HIGH (ref 0.61–1.24)
GFR calc Af Amer: 32 mL/min — ABNORMAL LOW (ref >60)
GFR calc non Af Amer: 28 mL/min — ABNORMAL LOW (ref >60)
GLUCOSE: 100 mg/dL — AB (ref 65–99)
POTASSIUM: 2.7 mmol/L — AB (ref 3.5–5.1)
SODIUM: 148 mmol/L — AB (ref 135–145)
Total Protein: 5.7 g/dL — ABNORMAL LOW (ref 6.5–8.1)

## 2015-02-21 LAB — CBC
HEMATOCRIT: 35.3 % — AB (ref 39.0–52.0)
HEMOGLOBIN: 10.4 g/dL — AB (ref 13.0–17.0)
MCH: 23.5 pg — AB (ref 26.0–34.0)
MCHC: 29.5 g/dL — AB (ref 30.0–36.0)
MCV: 79.7 fL (ref 78.0–100.0)
Platelets: 191 10*3/uL (ref 150–400)
RBC: 4.43 MIL/uL (ref 4.22–5.81)
RDW: 21.1 % — AB (ref 11.5–15.5)
WBC: 7.6 10*3/uL (ref 4.0–10.5)

## 2015-02-21 LAB — GLUCOSE, CAPILLARY
GLUCOSE-CAPILLARY: 97 mg/dL (ref 65–99)
Glucose-Capillary: 110 mg/dL — ABNORMAL HIGH (ref 65–99)
Glucose-Capillary: 102 mg/dL — ABNORMAL HIGH (ref 65–99)
Glucose-Capillary: 104 mg/dL — ABNORMAL HIGH (ref 65–99)

## 2015-02-21 LAB — MAGNESIUM: Magnesium: 2.1 mg/dL (ref 1.7–2.4)

## 2015-02-21 MED ORDER — POTASSIUM CHLORIDE 10 MEQ/100ML IV SOLN
10.0000 meq | INTRAVENOUS | Status: AC
  Administered 2015-02-21 (×5): 10 meq via INTRAVENOUS
  Filled 2015-02-21 (×5): qty 100

## 2015-02-21 MED ORDER — PANTOPRAZOLE SODIUM 40 MG IV SOLR
40.0000 mg | Freq: Every day | INTRAVENOUS | Status: DC
  Administered 2015-02-21 – 2015-03-02 (×9): 40 mg via INTRAVENOUS
  Filled 2015-02-21 (×9): qty 40

## 2015-02-21 MED ORDER — FUROSEMIDE 10 MG/ML IJ SOLN
40.0000 mg | Freq: Every day | INTRAMUSCULAR | Status: DC
  Administered 2015-02-21 – 2015-02-23 (×3): 40 mg via INTRAVENOUS
  Filled 2015-02-21 (×3): qty 4

## 2015-02-21 MED ORDER — HEPARIN SODIUM (PORCINE) 5000 UNIT/ML IJ SOLN
5000.0000 [IU] | Freq: Three times a day (TID) | INTRAMUSCULAR | Status: AC
  Administered 2015-02-21 (×2): 5000 [IU] via SUBCUTANEOUS
  Filled 2015-02-21 (×2): qty 1

## 2015-02-21 NOTE — Progress Notes (Signed)
5 runs of Potassium ordered for K+ of 2.7.

## 2015-02-21 NOTE — Progress Notes (Signed)
[facility] PROGRESS NOTE  [name] [name] [id number] DOB: 1933-09-08 DOA: 02/20/2015 PCP: Purvis Kilts, MD  HPI/Brief narrative 81 y.o. male with a hx of esophageal stricture requiring dilation, diastolic CHF, CAD s/p CABGx4, HLD, DM2 who presents to the ED with nausea and inability to tolerate PO intake. Pt was seen by Cardiology on 11/10 for same issues. Abd [place] was ordered to evaluate for GI cause of symptoms. Pt was noted to be markedly volume overloaded with ascites present. Patient was advised to increase diuretics to torsemide 40mg  bid x 3days then 40mg  daily. Pt's symptoms worsened and he was unable to tolerate PO. Pt subsequently presented to the ED. GI was called with recommendations for inpatient admission for stabilization of pt.  Assessment/Plan: 1. Dysphagia secondary to esophageal stricture 1. Pt was already planned for outpt dilation on 11/17, however, pt was unable to tolerate PO meds or nutrition 2. GI was called from ED with recs to admit and stabilize and possible dilation performed later 3. Patient remains NPO with dextrose basal IVF 2. Acute on chronic diastolic CHF 1. Pt was recently seen by Cardiology as an outpatient and noted to be markedly volume overloaded 2. Cont on IV lasix for now 3. Cont to monitor daily wts and i/o 3. DM2 1. Cont on SSI coverage 2. Glucose stable and controlled 4. Blindness 1. Stable 5. CAD s/p CABG x4 1. Stable 2. No chest pain 6. Prolonged QT syndrome 1. Most recent ekg with QTc of just under 460 2. Would avoid meds which would increase QTc 7. DVT prophylaxis 1. Heparin subQ 8. Hypothyroid 1. On 170mcg of levothyroxine prior to admit 2. Will cont on 23mcg IV  Code Status: DNR Family Communication: Pt in room Disposition Plan: Home when tolerating PO and cleared by GI   Consultants:  GI  Procedures:    Antibiotics: Anti-infectives    None      HPI/Subjective: No complaints. Pleasantly confused at  the moment  Objective: Filed Vitals:   02/20/15 1259 02/20/15 1439 02/20/15 2130 02/21/15 0627  BP: 146/100 136/82 131/74 130/66  Pulse: 79 73 74 77  Temp:  97.8 F (36.6 C) 98.2 F (36.8 C) 98.4 F (36.9 C)  TempSrc:  Oral Oral Oral  Resp: 20 16 18 18   Height:      Weight:      SpO2: 95% 96% 95% 96%    Intake/Output Summary (Last 24 hours) at 02/21/15 1142 Last data filed at 02/21/15 1026  Gross per 24 hour  Intake     13 ml  Output   3000 ml  Net  -2987 ml   Filed Weights   02/20/15 0946  Weight: 97.523 kg (215 lb)    Exam:   General:  Awake, in nad  Cardiovascular: regular, s1, s2  Respiratory: normal resp effort, no wheezing  Abdomen: soft,nondistended  Musculoskeletal: perfused, no clubbing   Data Reviewed: Basic Metabolic Panel:  Recent Labs Lab 02/18/15 1245 02/20/15 1013 02/21/15 0557  NA 142 143 148*  [name] 4.0 3.8 2.7*  CL 105 104 106  CO2 25 29 33*  GLUCOSE 121* 105* 100*  BUN 29* 39* 36*  CREATININE 2.30* 2.31* 2.14*  CALCIUM 9.5 9.3 8.7*  MG  --   --  2.1   Liver Function Tests:  Recent Labs Lab 02/18/15 1245 02/20/15 1013 02/21/15 0557  AST 137* 143* 108*  ALT 98* 129* 112*  ALKPHOS 66 71 59  BILITOT 1.6* 1.5* 1.2  PROT 6.6 6.5  5.7*  ALBUMIN 3.4* 3.4* 3.0*   No results for input(s): LIPASE, AMYLASE in the last 168 hours. No results for input(s): AMMONIA in the last 168 hours. CBC:  Recent Labs Lab 02/18/15 1245 02/20/15 1013 02/21/15 0557  WBC 7.7 8.3 7.6  NEUTROABS  --  6.5  --   HGB 11.2* 11.8* 10.4*  HCT 38.2* 40.4 35.3*  MCV 79.4 79.7 79.7  PLT 191 203 191   Cardiac Enzymes: No results for input(s): CKTOTAL, CKMB, CKMBINDEX, TROPONINI in the last 168 hours. BNP (last 3 results)  Recent Labs  09/18/14 0130 01/04/15 2140 02/18/15 1245  BNP 2240.0* 2464.0* 3201.1*    ProBNP (last 3 results) No results for input(s): PROBNP in the last 8760 hours.  CBG:  Recent Labs Lab 02/20/15 1650 02/20/15 2037  02/21/15 0721 02/21/15 1123  GLUCAP 84 80 110* 97    No results found for this or any previous visit (from the past 240 hour(s)).   Studies: Dg Chest Port 1 View  02/20/2015  CLINICAL DATA:  PICC placement.  Initial encounter. EXAM: PORTABLE CHEST 1 VIEW COMPARISON:  Chest radiograph performed 01/06/2015 FINDINGS: A right PICC is noted ending about the mid SVC. Small bilateral pleural effusions are noted. Vascular congestion is seen, with increased interstitial markings, concerning for mild pulmonary edema. No pneumothorax is identified. The cardiomediastinal silhouette is enlarged. The patient is status post median sternotomy. No acute osseous abnormalities are seen. IMPRESSION: 1. Right PICC noted ending about the mid SVC. 2. Small bilateral pleural effusions noted. Vascular congestion and cardiomegaly, with increased interstitial markings, concerning for mild pulmonary edema. Electronically Signed   By: Garald Balding M.D.   On: 02/20/2015 20:19    Scheduled Meds: . sodium chloride   Intravenous STAT  . furosemide  40 mg Intravenous Daily  . heparin  5,000 Units Subcutaneous 3 times per day  . insulin aspart  0-15 Units Subcutaneous TID WC  . levothyroxine  75 mcg Intravenous Daily  . pantoprazole (PROTONIX) IV  40 mg Intravenous QAC breakfast  . potassium chloride  10 mEq Intravenous Q1 Hr x 5  . sodium chloride  10-40 mL Intracatheter Q12H  . sodium chloride  3 mL Intravenous Q12H   Continuous Infusions: . dextrose 5 % and 0.9% NaCl 75 mL/hr at 02/21/15 [id number]    Principal Problem:   Dysphagia Active Problems:   Hyperlipidemia   S/P CABG x 4   Prolonged Q-T interval on ECG   DM type 2 (diabetes mellitus, type 2) (HCC)   Blindness   Chronic diastolic CHF (congestive heart failure) ([place])   Esophageal stricture   [name] [name]  [facility] Pager [phone]. If 7PM-7AM, please contact night-coverage at [email or web address], password [facility] 02/21/2015, 11:42 AM

## 2015-02-21 NOTE — Consult Note (Addendum)
Referring Provider: No ref. provider found Primary Care Physician:  Purvis Kilts, MD Primary Gastroenterologist:  DR. Laural Golden  Reason for Consultation:  DYSPHAGIA  Impression: ADMITTED WITH DIFFICULTY SWALLOWING ASSOCIATED WITH WEIGHT LOSS. BPE OCT 2016: 13 MM BARIUM TABLET DELAYED PASSING THROUGH DISTAL ESOPHAGUS. DYSPHAGIA DUE TO STRICTURE, ESOPHAGEAL MOTILITY DISORDER, LESS LIKELY DUE TO THE ESOPHAGEAL DIVERICTULUM, OR A MASS  Plan: 1. NPO EXCEPT ICE CHIPS 2. CONSIDER EGD/POSSIBE DILATION MON NOV 4 3. DAILY PPI 4. HOLD SQ HEPARIN NOV 14.     HPI:  LIMITED DUE TO PT COGNITIVE DYSFUNCTION. HISTORY OBTAINED FROM CARE GIVER: JEWEL. PT HAS HAD DIFFICULTY SWALLOWING FOR PAST MO. LAST SEEN AS OUTPATIENT OCT 18. BPE OCT 25 SHOWED Extensive tertiary contractions are seen involving the middle and distal esophagus consistent with presbyesophagus. Area persistent narrowing is seen in the distal esophagus with probable associated diverticulum. It took approximately 2 minutes for barium tablet to pass through esophagus into stomach. PT HAS HAD NAUSEA AND VOMITING ASSOCIATED WITH DIFFICULTY SWALLOWING. HAS C/O MID ABDOMINAL PAIN AND BACK PAIN. SINCE TUES HE HAS BEEN UNABLE TO KEEP DOWN WATER. HE HAS NOT HAD A BM IN 6 DAYS. HAS 24 HR CARE AT HOME DUE TO DEMENTIA. PT DENIES FEVER, CHILLS, HEMATOCHEZIA, HEMATEMESIS, melena, diarrhea, CHEST PAIN, SHORTNESS OF BREATH, OR heartburn or indigestion. WEIGHT DOWN FROM 237 LBS IN OCT 2015 TO 215 LBS NOV 2016.  Past Medical History  Diagnosis Date  . Chronic constipation   . GERD (gastroesophageal reflux disease)   . Hyperlipidemia   . Orthostatic hypotension 11/19/2013  . Coronary artery disease     a. 12/2013 - STEMI s/p CABGx4.  . Mild dementia   . Hypothyroidism   . History of esophageal dilatation   . History of gastritis   . History of hiatal hernia   . History of bladder cancer     hx  TCC low grade  . Kidney tumor     right  . BPH (benign  prostatic hyperplasia)   . CKD (chronic kidney disease) stage 3, GFR 30-59 ml/min     a. h/o nephrectomy.  [name] History of kidney stones   . ED (erectile dysfunction)   . S/P CABG x 4     12-18-2013  . Bladder tumor   . DVT (deep venous thrombosis) ([place])     a. s/p IVC filter 09/2014. Xarelto stopped at that time due to hematuria and hemoccult + stools.  . Myocardial infarction ([place]) 12/2013  . Stroke [facility])     TIA  . Chronic combined systolic and diastolic CHF (congestive heart failure) (HCC)     systolic and diastolic heart failure  . Depression   . Hematuria   . Heme positive stool   . Anemia   . Shortness of breath dyspnea     non change in status per dtr-in-low depends on weather and activity   . Type 2 diabetes mellitus (HCC)     borderline on no medications   . Anxiety   . Prostate cancer ([place])     low-grade and bladder cancer and kidney cancer   . OSA on CPAP     mild osa   per sleep study 07-11-2009- patient states does not use regularly    Past Surgical History  Procedure Laterality Date  . Upper gastrointestinal endoscopy  04/06/2010  &  01-08-2013    w/ esophageal dilatation  . Hemorrhoid surgery    . Hand surgery      right  .  Wrist fracture surgery      pins placed-left  . Cystoscopy with biopsy  01/09/2012    Procedure: CYSTOSCOPY WITH BIOPSY;  Surgeon: Marissa Nestle, MD;  Location: [facility] ORS;  Service: Urology;  Laterality: N/A;  Cystoscopy with Multiple Bladder Biopsies  . Esophagogastroduodenoscopy (egd) with esophageal dilation N/A 01/08/2013    Procedure: ESOPHAGOGASTRODUODENOSCOPY (EGD) WITH ESOPHAGEAL DILATION;  Surgeon: Rogene Houston, MD;  Location: [facility] ENDO SUITE;  Service: Endoscopy;  Laterality: N/A;  120  . Left heart cath N/A 12/17/2013    Procedure: LEFT HEART CATH;  Surgeon: Leonie Man, MD;  Location: [facility] CATH LAB;  Service: Cardiovascular;  Laterality: N/A;  . Left heart catheterization with coronary angiogram N/A 12/17/2013    Procedure: LEFT  HEART CATHETERIZATION WITH CORONARY ANGIOGRAM;  Surgeon: Leonie Man, MD;  Location: [facility] CATH LAB;  Service: Cardiovascular;  Laterality: N/A;  . Colonoscopy with esophagogastroduodenoscopy (egd)  08-15-2006    w/ esophageal dilatation  . Umbilical hernia repair  04-24-2003  . Transurethral resection of bladder tumor  06-14-2004  . Transurethral resection of prostate  10-04-2004  . Cysto/  removal bladder calculus/  resection bladder tumor  10-16-2006  . Cystostomy w/ bladder biopsy  01-09-2012  . Cardiac catheterization  12-17-2013   dr Shanon Brow harding    severe disease RCA with thrombotic appearing subtotal occlusions/  95-99%  OM2/  diffuse disease LAD  and D1/  mild to moderate basal to mid inferior hypokinesis/  severe elevated LVEDP/  ef 45-50%  . Coronary artery bypass graft N/A 12/18/2013    Procedure: Coronary artery bypass grafting times four using left internal mammary artery and endoscopically harvested right saphenous vein graft;  Surgeon: Gaye Pollack, MD;  Location: [facility] OR;  Service: Open Heart Surgery;  Laterality: N/A;  . Transthoracic echocardiogram  11-14-2012    grade I diastolic dysfunction/  ef 55-60%/  trivial MR  and TR  . Cardiovascular stress test  08-09-2009  dr berry    mild to moderate perfusion defect in basal , mid, and apical inferior regions consistent with an infart/scar/   No evidence ischemia/  ef 51%/  no ECG changes/   low risk scan  . Transurethral resection of bladder tumor N/A 04/07/2014    Procedure: TRANSURETHRAL RESECTION OF BLADDER TUMOR (TURBT);  Surgeon: Arvil Persons, MD;  Location: [facility];  Service: Urology;  Laterality: N/A;  . Cystoscopy/retrograde/ureteroscopy Right 04/07/2014    Procedure: CYSTOSCOPY/RETROGRADE/URETEROSCOPY WITH BIOPSY OF RENAL PELVIS TUMOR;  Surgeon: Arvil Persons, MD;  Location: [facility];  Service: Urology;  Laterality: Right;  . Robot assisted laparoscopic nephrectomy Right 06/05/2014     Procedure: ROBOTIC ASSISTED LAPAROSCOPIC RIGHT NEPHROURETERECTOMY, VENTRAL HERNIA REPAIR ;  Surgeon: Ardis Hughs, MD;  Location: [facility] ORS;  Service: Urology;  Laterality: Right;  . Cystoscopy w/ ureteral stent placement Right 06/05/2014    Procedure: CYSTOSCOPY ;  Surgeon: Ardis Hughs, MD;  Location: [facility] ORS;  Service: Urology;  Laterality: Right;  . Radiology with anesthesia N/A 06/22/2014    Procedure: MRI BRAIN W/O CONTRAST;  Surgeon: Medication Radiologist, MD;  Location: [place];  Service: Radiology;  Laterality: N/A;  . Eye surgery      cataract surgery  . Hernia repair  [id number]    umbilical hernia repair at time of nephrectomy  . Right kidney removal     . Ivc filter placement ([facility] hx)  09/17/2014   . Transurethral resection of bladder tumor N/A  12/24/2014    Procedure: TRANSURETHRAL RESECTION OF BLADDER TUMOR (TURBT);  Surgeon: Ardis Hughs, MD;  Location: [facility] ORS;  Service: Urology;  Laterality: N/A;  . Nephrectomy    . Bypass graft      Prior to Admission medications   Medication Sig Start Date End Date Taking? Authorizing Provider  aspirin 81 MG chewable tablet Chew 1 tablet (81 mg total) by mouth daily. 08/20/14  Yes Lezlie Octave Black, NP  donepezil (ARICEPT) 10 MG tablet Take 10 mg by mouth at bedtime.   Yes Historical Provider, MD  escitalopram (LEXAPRO) 20 MG tablet Take 20 mg by mouth every morning.    Yes Historical Provider, MD  levothyroxine (SYNTHROID, LEVOTHROID) 125 MCG tablet Take 125 mcg by mouth daily before breakfast.   Yes Historical Provider, MD  midodrine (PROAMATINE) 10 MG tablet Take 1 tablet (10 mg total) by mouth 3 (three) times daily. 02/18/15  Yes Larey Dresser, MD  mirabegron ER (MYRBETRIQ) 50 MG TB24 tablet Take 1 tablet (50 mg total) by mouth every morning. 12/25/14  Yes Ardis Hughs, MD  ondansetron (ZOFRAN-ODT) 4 MG disintegrating tablet Take 4 mg by mouth every 8 (eight) hours as needed for nausea or vomiting.   Yes Historical Provider, MD   pantoprazole (PROTONIX) 40 MG tablet TAKE ONE TABLET EVERY MORNING 04/06/14  Yes Butch Penny, NP  polyethylene glycol (MIRALAX / GLYCOLAX) packet Take 17 g by mouth at bedtime.   Yes Historical Provider, MD  potassium chloride SA (K-DUR,KLOR-CON) 20 MEQ tablet Take 2 tabs in AM and 1 tab in PM 02/18/15  Yes Larey Dresser, MD  temazepam (RESTORIL) 15 MG capsule Take 15 mg by mouth at bedtime as needed for sleep.   Yes Sharilyn Sites, MD  torsemide [facility]) 20 MG tablet Take 2 tabs in AM and 1 tab in PM 02/18/15  Yes Larey Dresser, MD  ziprasidone (GEODON) 20 MG capsule Take 20 mg by mouth every morning.  09/15/14  Yes Historical Provider, MD    Current Facility-Administered Medications  Medication Dose Route Frequency Provider Last Rate Last Dose  . 0.9 %  sodium chloride infusion   Intravenous STAT Daleen Bo, MD      . dextrose 5 %-0.9 % sodium chloride infusion   Intravenous Continuous Donne Hazel, MD 100 mL/hr at 02/21/15 0916 1 mL at 02/21/15 0916  . furosemide (LASIX) injection 40 mg  40 mg Intravenous Daily Donne Hazel, MD      . heparin injection 5,000 Units  5,000 Units Subcutaneous 3 times per day Donne Hazel, MD   5,000 Units at 02/21/15 0500  . insulin aspart (novoLOG) injection 0-15 Units  0-15 Units Subcutaneous TID WC Donne Hazel, MD   0 Units at 02/20/15 1700  . levothyroxine (SYNTHROID, LEVOTHROID) injection 75 mcg  75 mcg Intravenous Daily Donne Hazel, MD   75 mcg at 02/20/15 1430  .         .         . pantoprazole (PROTONIX) injection 40 mg  40 mg Intravenous Q24H Donne Hazel, MD   40 mg at 02/20/15 1705  . potassium chloride 10 mEq in 100 mL IVPB  10 mEq Intravenous Q1 Hr x 5 Donne Hazel, MD   10 mEq at 02/21/15 0916  .         .         .         [name]  Allergies as of 02/20/2015 - Review Complete 02/20/2015  Allergen Reaction Noted  . Statins Other (See Comments) 11/06/2012    Family History  Problem Relation Age of Onset  .  Cancer - Lung Brother   . Heart disease Mother   . Heart attack Mother     Social History   Social History  . Marital Status: Divorced    Spouse Name: N/A  . Number of Children: 3  . Years of Education: HS   Occupational History  .     Social History Main Topics  . Smoking status: Former Smoker -- 1.00 packs/day for 4 years    Types: Cigarettes    Start date: 09/08/1952    Quit date: 09/09/1959  . Smokeless tobacco: Former Systems developer    Types: [place] date: 01/03/1960  . Alcohol Use: No  . Drug Use: No  . Sexual Activity: Yes    Birth Control/ Protection: None   Other Topics Concern  . Not on file   Social History Narrative   Patient is single and lives alone.   Patient is retired.   Patient has a high school education.   Patient is right-handed.   Patient drinks one cup of coffee and one cup of soda or tea daily.   Patient has three adult children.      Lives in [place] round the clock care   Review of Systems: PER HPI OTHERWISE ALL SYSTEMS ARE NEGATIVE. LIMITED DUE TO PT COGNITIVE DYSFUNCTION  Vitals: Blood pressure 130/66, pulse 77, temperature 98.4 F (36.9 C), temperature source Oral, resp. rate 18, height 5\' 10"  (1.778 m), weight 215 lb (97.523 kg), SpO2 96 %.  Physical Exam: General:   Alert,  Well-developed, well-nourished, in NAD Head:  Normocephalic and atraumatic. Eyes:  Sclera clear, no icterus.   Conjunctiva pink. Neck:  Supple; no masses.Lungs:  Clear throughout to auscultation.   No wheezes. No acute distress. Heart:  Regular rate and rhythm; no murmurs. Abdomen:  Soft, nontender and nondistended. No masses noted. Normal bowel sounds, without guarding, and without rebound.   Msk:  Symmetrical, Extremities:  Without edema.Neurologic:  Alert AND INTERACTIVE, NO  NEW FOCAL DEFICITS, Cervical Nodes:  No significant cervical adenopathy. Psych:  Alert, FLAT AFFECT.  Lab Results:  Recent Labs  02/18/15 1245 02/20/15 1013 02/21/15 0557  WBC  7.7 8.3 7.6  HGB 11.2* 11.8* 10.4*  HCT 38.2* 40.4 35.3*  PLT 191 203 191   BMET  Recent Labs  02/20/15 1013 02/21/15 0557  NA 143 148*  K 3.8 2.7*  CL 104 106  CO2 29 33*  GLUCOSE 105* 100*  BUN 39* 36*  CREATININE 2.31* 2.14*  CALCIUM 9.3 8.7*   LFT  Recent Labs  02/21/15 0557  PROT 5.7*  ALBUMIN 3.0*  AST 108*  ALT 112*  ALKPHOS 59  BILITOT 1.2     Studies/Results: MAR 2016: IVC FILTER PLACED CXR: R PICC LINE    Walda Hertzog  02/21/2015, 10:00 AM

## 2015-02-21 NOTE — Progress Notes (Signed)
CRITICAL VALUE ALERT  Critical value received:  Potassium  Date of notification:  02/21/2015  Time of notification:  0740  Critical value read back:Yes.    Nurse who received alert:  Hampton Abbot, RN  MD notified (1st page):  Wyline Copas  Time of first page:  0745  MD notified (2nd page):  Time of second page:  Responding MD:  Wyline Copas  Time MD responded:  [phone]

## 2015-02-22 ENCOUNTER — Encounter (HOSPITAL_COMMUNITY)
Admission: EM | Disposition: A | Discharge status: Hospice | Payer: Self-pay | Source: Home / Self Care | Attending: Internal Medicine

## 2015-02-22 ENCOUNTER — Ambulatory Visit: Payer: Self-pay | Admitting: *Deleted

## 2015-02-22 ENCOUNTER — Other Ambulatory Visit: Payer: Self-pay | Admitting: *Deleted

## 2015-02-22 ENCOUNTER — Encounter (HOSPITAL_COMMUNITY): Payer: Self-pay | Admitting: *Deleted

## 2015-02-22 DIAGNOSIS — R1310 Dysphagia, unspecified: Secondary | ICD-10-CM

## 2015-02-22 DIAGNOSIS — R112 Nausea with vomiting, unspecified: Secondary | ICD-10-CM

## 2015-02-22 DIAGNOSIS — R451 Restlessness and agitation: Secondary | ICD-10-CM

## 2015-02-22 DIAGNOSIS — K449 Diaphragmatic hernia without obstruction or gangrene: Secondary | ICD-10-CM

## 2015-02-22 DIAGNOSIS — Z539 Procedure and treatment not carried out, unspecified reason: Secondary | ICD-10-CM

## 2015-02-22 HISTORY — PX: ESOPHAGOGASTRODUODENOSCOPY: SHX5428

## 2015-02-22 LAB — GLUCOSE, CAPILLARY
GLUCOSE-CAPILLARY: 109 mg/dL — AB (ref 65–99)
GLUCOSE-CAPILLARY: 114 mg/dL — AB (ref 65–99)
Glucose-Capillary: 96 mg/dL (ref 65–99)
Glucose-Capillary: 107 mg/dL — ABNORMAL HIGH (ref 65–99)

## 2015-02-22 LAB — CBC
HCT: 36.2 % — ABNORMAL LOW (ref 39.0–52.0)
Hemoglobin: 10.4 g/dL — ABNORMAL LOW (ref 13.0–17.0)
MCH: 23 pg — ABNORMAL LOW (ref 26.0–34.0)
MCHC: 28.7 g/dL — ABNORMAL LOW (ref 30.0–36.0)
MCV: 79.9 fL (ref 78.0–100.0)
PLATELETS: 178 10*3/uL (ref 150–400)
RBC: 4.53 MIL/uL (ref 4.22–5.81)
RDW: 21.2 % — ABNORMAL HIGH (ref 11.5–15.5)
WBC: 7.7 10*3/uL (ref 4.0–10.5)

## 2015-02-22 LAB — BASIC METABOLIC PANEL
Anion gap: 9 (ref 5–15)
BUN: 29 mg/dL — ABNORMAL HIGH (ref 6–20)
CALCIUM: 8.8 mg/dL — AB (ref 8.9–10.3)
CO2: 34 mmol/L — ABNORMAL HIGH (ref 22–32)
CREATININE: 2.03 mg/dL — AB (ref 0.61–1.24)
Chloride: 107 mmol/L (ref 101–111)
GFR calc non Af Amer: 29 mL/min — ABNORMAL LOW (ref >60)
GFR, EST AFRICAN AMERICAN: 34 mL/min — AB (ref >60)
Glucose, Bld: 101 mg/dL — ABNORMAL HIGH (ref 65–99)
Potassium: 3 mmol/L — ABNORMAL LOW (ref 3.5–5.1)
SODIUM: 150 mmol/L — AB (ref 135–145)

## 2015-02-22 SURGERY — EGD (ESOPHAGOGASTRODUODENOSCOPY)
Anesthesia: Moderate Sedation

## 2015-02-22 MED ORDER — MIDAZOLAM HCL 5 MG/5ML IJ SOLN
INTRAMUSCULAR | Status: AC
  Filled 2015-02-22: qty 10

## 2015-02-22 MED ORDER — BUTAMBEN-TETRACAINE-BENZOCAINE 2-2-14 % EX AERO
INHALATION_SPRAY | CUTANEOUS | Status: DC | PRN
  Administered 2015-02-22: 1 via TOPICAL

## 2015-02-22 MED ORDER — SODIUM CHLORIDE 0.9 % IV SOLN: INTRAVENOUS | Status: DC

## 2015-02-22 MED ORDER — STERILE WATER FOR IRRIGATION IR SOLN
Status: DC | PRN
  Administered 2015-02-22: 17:00:00

## 2015-02-22 MED ORDER — POTASSIUM CHLORIDE 10 MEQ/100ML IV SOLN
10.0000 meq | INTRAVENOUS | Status: AC
  Administered 2015-02-22 (×5): 10 meq via INTRAVENOUS
  Filled 2015-02-22 (×5): qty 100

## 2015-02-22 MED ORDER — MEPERIDINE HCL 50 MG/ML IJ SOLN
INTRAMUSCULAR | Status: AC
  Filled 2015-02-22: qty 1

## 2015-02-22 MED ORDER — MIDAZOLAM HCL 5 MG/5ML IJ SOLN
INTRAMUSCULAR | Status: DC | PRN
  Administered 2015-02-22 (×4): 1 mg via INTRAVENOUS

## 2015-02-22 NOTE — Patient Outreach (Signed)
[facility]) Care Management  02/22/2015  [name] [name] 28-Sep-1933 [id number]  Noted [name] [name]'s admission over the weekend for vomiting and acute/chronic CHF. [name] [name] is being followed in the community by [facility].  [name] [name] has had 4 hospital admissions this year (excluding this admission) in addition to 1 ED visit. [name] [name] was most recently discharged from the hospital after a CHF exacerbation. [name] [name] lives at home in [place], has dementia and requires 24/7 caregivers including his daughter in law [name] [name] who is his primary caregiver. [name] [name] is very knowledgeable and manages all of [name] [name]'s care. She is the hands on caregiver M-W and private duty caregivers are present at all other times. [name] [name] has long term care insurance to help pay for these services. [name] [name] recently saw his primary care provider - Dr. Sharilyn Sites, his nephrologist - Dr. Louis Meckel, and his gastroenterologist Dr. Oneida Alar for ongoing/worsening esophageal dysmotility. [name] [name] was recently scoped and scheduled for esophageal dilatation.   I will follow [name] [name]'s progress closely and will reach out to his daughter in law [name] [name] within 48 hours of his discharge to ensure that transition of care needs are met.    [facility]  [phone]

## 2015-02-22 NOTE — Op Note (Signed)
EGD PROCEDURE REPORT  PATIENT:  [name] [name]  MR#:  [id number] Birthdate:  [birth year], 81 y.o., male Endoscopist:  Dr. Rogene Houston, MD Referred By:  Dr. Purvis Kilts, MD Procedure Date: 02/22/2015  Procedure:   EGD(ED not performed)  Indications:  Patient is 81 year old Caucasian male with multiple medical problems who has recurrent dysphagia felt to be secondary to esophageal motility disorder. Patient underwent barium pill esophagogram few weeks ago revealing corkscrew esophagus and narrowing at GEJ delaying passage of barium pill into the stomach. Patient was scheduled for outpatient procedure on 02/25/2015. Patient was hospitalized over the weekend for dehydration secondary to vomiting felt to be due to gastroenteritis.  Informed consent was obtained from ration's daughter-in-law who is at bedside.           Informed Consent:  The risks, benefits, alternatives & imponderables which include, but are not limited to, bleeding, infection, perforation, drug reaction and potential missed lesion have been reviewed.  The potential for biopsy, lesion removal, esophageal dilation, etc. have also been discussed.  Questions have been answered.  All parties agreeable.  Please see history & physical in medical record for more information.  Medications:  Versed 4 mg IV Cetacaine spray topically for oropharyngeal anesthesia  Description of procedure:  The endoscope was introduced through the mouth and advanced to the second portion of the duodenum without difficulty or limitations. The mucosal surfaces were surveyed very carefully during advancement of the scope and upon withdrawal.  Findings:  Esophagus:  Mucosa of the esophagus was normal. Resistance noted as the scope was advanced from proximal to distal end of the esophagus due to prominent contractions. Each junction was unremarkable without ring or stricture as suspected on barium study. GEJ:  43 cm Hiatus:  45 cm Stomach:  Stomach was  empty and distended very well with insufflation. Folds in the proximal stomach were normal. Examination of mucosa at gastric body, antrum, pyloric channel, angularis, fundus and cardia was normal. Duodenum:  Normal bulbar and post bulbar mucosa.  Therapeutic/Diagnostic Maneuvers Performed:   Esophageal dilation was not attempted as patient was restless and uncooperative.  Complications:  None  EBL: None  Impression: Small sliding hiatal hernia otherwise normal EGD. Suspect dysphagia secondary to esophageal motility disorder.   Recommendations:  Full liquid diet. Reevaluate patient in a.m.  REHMAN,NAJEEB U  02/22/2015  5:41 PM  CC: Dr. Hilma Favors, Betsy Coder, MD & Dr. Rayne Du ref. provider found

## 2015-02-22 NOTE — Progress Notes (Signed)
[facility] PROGRESS NOTE  [name] [name] [id number] DOB: [birth year] DOA: 02/20/2015 PCP: Purvis Kilts, MD  HPI/Brief narrative 81 y.o. male with a hx of esophageal stricture requiring dilation, diastolic CHF, CAD s/p CABGx4, HLD, DM2 who presents to the ED with nausea and inability to tolerate PO intake. Pt was seen by Cardiology on 11/10 for same issues. Abd [place] was ordered to evaluate for GI cause of symptoms. Pt was noted to be markedly volume overloaded with ascites present. Patient was advised to increase diuretics to torsemide 40mg  bid x 3days then 40mg  daily. Pt's symptoms worsened and he was unable to tolerate PO. Pt subsequently presented to the ED. GI was called with recommendations for inpatient admission for stabilization of pt.  Assessment/Plan: 1. Dysphagia secondary to esophageal stricture 1. Pt initially was already planned for outpt dilation on 11/17, however, pt was unable to tolerate PO meds or nutrition and was therefore admitted 2. Patient had remained NPO with dextrose basal IVF 3. GI consulted and patient underwent EGD on 11/14. Discussed findings with Dr. Laural Golden. Pt noted to have no stricture on exam, concerns for motility issues. Pt was noted to be too uncooperative to perform dilation. Recs to begin advancing diet and if patient continues to have swallowing difficulties, then consider either PEG vs repeat EGD with anesthesia 2. Acute on chronic diastolic CHF 1. Pt was recently seen by Cardiology as an outpatient and noted to be markedly volume overloaded 2. Cont on IV lasix for now 3. Cont to monitor daily wts and i/o 4. Edema is improving, as is renal function 5. Continue to diurese as toelrated for now 3. DM2 1. Cont on SSI coverage 2. Glucose stable and controlled 4. Blindness 1. Stable 5. CAD s/p CABG x4 1. Stable 2. No chest pain 6. Prolonged QT syndrome 1. Most recent ekg with QTc of just under 460 2. Would avoid meds which would increase  QTc 7. DVT prophylaxis 1. Heparin subQ 8. Hypothyroid 1. On 127mcg of levothyroxine prior to admit 2. Will cont on 57mcg IV 3. TSH elevated at 6.69, thus would d/c on 164mcg PO when discharged and repeat TSH in 4-6 weeks  Code Status: DNR Family Communication: Pt in room Disposition Plan: Home when tolerating PO and cleared by GI   Consultants:  GI  Procedures:    Antibiotics: Anti-infectives    None      HPI/Subjective: Confused  Objective: Filed Vitals:   02/22/15 1705 02/22/15 1710 02/22/15 1715 02/22/15 1720  BP: 123/87 116/85 123/81 124/99  Pulse:   87 83  Temp:      TempSrc:      Resp: 20 14 22 16   Height:      Weight:      SpO2:   82% 95%    Intake/Output Summary (Last 24 hours) at 02/22/15 1737 Last data filed at 02/22/15 1726  Gross per 24 hour  Intake 3206.67 ml  Output   3500 ml  Net -293.33 ml   Filed Weights   02/20/15 0946 02/22/15 0653  Weight: 97.523 kg (215 lb) 97.2 kg (214 lb 4.6 oz)    Exam:   General:  Awake, appears confused, in nad  Cardiovascular: regular, s1, s2  Respiratory: normal resp effort, no wheezing  Abdomen: soft,nondistended  Musculoskeletal: perfused, no clubbing, LE edema improving  Data Reviewed: Basic Metabolic Panel:  Recent Labs Lab 02/18/15 1245 02/20/15 1013 02/21/15 0557 02/22/15 0522  NA 142 143 148* 150*  K 4.0 3.8 2.7* 3.0*  CL 105 104 106 107  CO2 25 29 33* 34*  GLUCOSE 121* 105* 100* 101*  BUN 29* 39* 36* 29*  CREATININE 2.30* 2.31* 2.14* 2.03*  CALCIUM 9.5 9.3 8.7* 8.8*  MG  --   --  2.1  --    Liver Function Tests:  Recent Labs Lab 02/18/15 1245 02/20/15 1013 02/21/15 0557  AST 137* 143* 108*  ALT 98* 129* 112*  ALKPHOS 66 71 59  BILITOT 1.6* 1.5* 1.2  PROT 6.6 6.5 5.7*  ALBUMIN 3.4* 3.4* 3.0*   No results for input(s): LIPASE, AMYLASE in the last 168 hours. No results for input(s): AMMONIA in the last 168 hours. CBC:  Recent Labs Lab 02/18/15 1245  02/20/15 1013 02/21/15 0557 02/22/15 0522  WBC 7.7 8.3 7.6 7.7  NEUTROABS  --  6.5  --   --   HGB 11.2* 11.8* 10.4* 10.4*  HCT 38.2* 40.4 35.3* 36.2*  MCV 79.4 79.7 79.7 79.9  PLT 191 203 191 178   Cardiac Enzymes: No results for input(s): CKTOTAL, CKMB, CKMBINDEX, TROPONINI in the last 168 hours. BNP (last 3 results)  Recent Labs  09/18/14 0130 01/04/15 2140 02/18/15 1245  BNP 2240.0* 2464.0* 3201.1*    ProBNP (last 3 results) No results for input(s): PROBNP in the last 8760 hours.  CBG:  Recent Labs Lab 02/21/15 1123 02/21/15 1613 02/21/15 2123 02/22/15 0709 02/22/15 1147  GLUCAP 97 102* 104* 109* 114*    No results found for this or any previous visit (from the past 240 hour(s)).   Studies: Dg Chest Port 1 View  02/20/2015  CLINICAL DATA:  PICC placement.  Initial encounter. EXAM: PORTABLE CHEST 1 VIEW COMPARISON:  Chest radiograph performed 01/06/2015 FINDINGS: A right PICC is noted ending about the mid SVC. Small bilateral pleural effusions are noted. Vascular congestion is seen, with increased interstitial markings, concerning for mild pulmonary edema. No pneumothorax is identified. The cardiomediastinal silhouette is enlarged. The patient is status post median sternotomy. No acute osseous abnormalities are seen. IMPRESSION: 1. Right PICC noted ending about the mid SVC. 2. Small bilateral pleural effusions noted. Vascular congestion and cardiomegaly, with increased interstitial markings, concerning for mild pulmonary edema. Electronically Signed   By: Garald Balding M.D.   On: 02/20/2015 20:19    Scheduled Meds: . sodium chloride   Intravenous STAT  . [MAR Hold] furosemide  40 mg Intravenous Daily  . [MAR Hold] insulin aspart  0-15 Units Subcutaneous TID WC  . [MAR Hold] levothyroxine  75 mcg Intravenous Daily  . meperidine      . midazolam      . [MAR Hold] pantoprazole (PROTONIX) IV  40 mg Intravenous QAC breakfast  . [MAR Hold] sodium chloride  10-40 mL  Intracatheter Q12H  . [MAR Hold] sodium chloride  3 mL Intravenous Q12H   Continuous Infusions: . sodium chloride    . dextrose 5 % and 0.9% NaCl 75 mL/hr at 02/21/15 [id number]    Principal Problem:   Dysphagia Active Problems:   Hyperlipidemia   S/P CABG x 4   Prolonged Q-T interval on ECG   DM type 2 (diabetes mellitus, type 2) (HCC)   Blindness   Chronic diastolic CHF (congestive heart failure) ([place])   Esophageal stricture   CHIU, STEPHEN K  [facility] Pager [phone]. If 7PM-7AM, please contact night-coverage at [email or web address], password [facility] 02/22/2015, 5:37 PM  LOS: 1 day

## 2015-02-23 ENCOUNTER — Other Ambulatory Visit: Payer: Self-pay | Admitting: *Deleted

## 2015-02-23 DIAGNOSIS — Z7189 Other specified counseling: Secondary | ICD-10-CM

## 2015-02-23 DIAGNOSIS — Z951 Presence of aortocoronary bypass graft: Secondary | ICD-10-CM

## 2015-02-23 DIAGNOSIS — I5043 Acute on chronic combined systolic (congestive) and diastolic (congestive) heart failure: Secondary | ICD-10-CM

## 2015-02-23 DIAGNOSIS — Z515 Encounter for palliative care: Secondary | ICD-10-CM

## 2015-02-23 LAB — GLUCOSE, CAPILLARY
GLUCOSE-CAPILLARY: 124 mg/dL — AB (ref 65–99)
GLUCOSE-CAPILLARY: 83 mg/dL (ref 65–99)
Glucose-Capillary: 118 mg/dL — ABNORMAL HIGH (ref 65–99)
Glucose-Capillary: 93 mg/dL (ref 65–99)

## 2015-02-23 LAB — BASIC METABOLIC PANEL
Anion gap: 8 (ref 5–15)
BUN: 22 mg/dL — ABNORMAL HIGH (ref 6–20)
CO2: 32 mmol/L (ref 22–32)
Calcium: 8.9 mg/dL (ref 8.9–10.3)
Chloride: 111 mmol/L (ref 101–111)
Creatinine, Ser: 1.81 mg/dL — ABNORMAL HIGH (ref 0.61–1.24)
GFR calc Af Amer: 39 mL/min — ABNORMAL LOW (ref >60)
GFR calc non Af Amer: 34 mL/min — ABNORMAL LOW (ref >60)
Glucose, Bld: 126 mg/dL — ABNORMAL HIGH (ref 65–99)
Potassium: 3.3 mmol/L — ABNORMAL LOW (ref 3.5–5.1)
Sodium: 151 mmol/L — ABNORMAL HIGH (ref 135–145)

## 2015-02-23 MED ORDER — ONDANSETRON HCL 4 MG/2ML IJ SOLN
4.0000 mg | Freq: Four times a day (QID) | INTRAMUSCULAR | Status: DC | PRN
  Administered 2015-02-25 – 2015-02-28 (×4): 4 mg via INTRAVENOUS
  Filled 2015-02-23 (×4): qty 2

## 2015-02-23 MED ORDER — ENSURE ENLIVE PO LIQD
237.0000 mL | Freq: Two times a day (BID) | ORAL | Status: DC
  Administered 2015-02-23 – 2015-03-02 (×12): 237 mL via ORAL

## 2015-02-23 NOTE — Consult Note (Signed)
Consultation Note Date: 02/23/2015   Patient Name: [name] [name]  DOB: [birth year]  MRN: [id number]  Age / Sex: 81 y.o., male   PCP: Sharilyn Sites, MD Referring Physician: Koleen Nimrod Acost*  Reason for Consultation: Establishing goals of care and Psychosocial/spiritual support  Palliative Care Assessment and Plan Summary of Established Goals of Care and Medical Treatment Preferences   Clinical Assessment/Narrative: [name] [name] is resting in bed, confused, with mitts to keep him from pulling lines. DIL [name], at bedside. She tells me that [name] [name] has had a "rollercoaster ride", and goes on to tell about 4 vessel CABG September 2015, Bladder cancer with surgery, subsequent kidney cancer with nephrectomy, recurrence of bladder cancer ( no treatments offered), CHF after CABG, and dementia ( he sometimes doesn't know what to do with his shoes).   She shares that his appetite "comes and goes" at times declining to eat, others eating everything.  She tells of being long term caregiver for her mother in law and the family experiences with her dementia.   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  She tells me that she recently asked if he were hungry, he stated "not at all, all I have left to enjoy is a good meal."  I share the chronic illness trajectory and the expected declines with dementia, CHF, and kidney disease.  [name] states son [name] is HCPOA and he is wanting PEG tube placement, as he feels that his father will get over this illness, that he "has a new heart" after CABG.  [name] is realistic when talking with family regarding prognosis.   Will schedule face to face with family. Call to [facility] who tells me that he can not take time off from his job at [employer], but agrees to phone conference 11/16 at 1230.   Contacts/Participants in Discussion: Primary Decision Maker:  HCPOA, Son [place]. Sons  Harrell Gave ( wife [name]) and [place].   HCPOA: yes   Code Status/Advance Care Planning:  DNR  Per [name], [name] [name] has AD stating no mechanical support/ tube feeding.   Symptom Management:   Morphine 2 mg IV Q 4 hours PRN.   Palliative Prophylaxis: None at this time.   Psycho-social/Spiritual:   Support System: Lives at home with 24/7 caregivers. DIL Michelle manages these and does fill in.   Desire for further Chaplaincy support:Not discussed today.   Prognosis: Unable to determine, likely less than 6 months.   Discharge Planning:  Undecided at this time.  We discussed the options of home with skilled caregivers 24/7, SNF for rehab or placement, and in patient Hospice.        Chief Complaint:  Dysphagia History of Present Illness:  [name] [name] is a 81 y.o. male with a hx of esophageal stricture requiring dilation, diastolic CHF, CAD s/p CABGx4, HLD, DM2 who presents to the ED with nausea and inability to tolerate PO intake. Pt was seen by Cardiology on 11/10 for same issues. Abd [place] was ordered to evaluate for GI cause of symptoms. Pt was noted to be markedly volume overloaded with ascites present. Patient was advised to increase diuretics to torsemide 40mg  bid x 3days then 40mg  daily. Pt's symptoms worsened and he was unable to tolerate PO. Pt subsequently presented to the ED. GI was called with recommendations for inpatient admission for stabilization of pt. Patient was given one dose of IV lasix. Hospitalist consulted  for admission.  Primary Diagnoses  Present on Admission:  . Hyperlipidemia . Blindness . Acute on chronic combined systolic and diastolic CHF (congestive heart failure) ([place]) . Prolonged Q-T interval on ECG  Palliative Review of Systems: Mr. [name] [name] is unable to participate in ROS d/t confusion, he denies pain.  I have reviewed the medical record, interviewed the patient and family, and examined the patient. The following aspects are pertinent.  Past  Medical History  Diagnosis Date  . Chronic constipation   . GERD (gastroesophageal reflux disease)   . Hyperlipidemia   . Orthostatic hypotension 11/19/2013  . Coronary artery disease     a. 12/2013 - STEMI s/p CABGx4.  . Mild dementia   . Hypothyroidism   . History of esophageal dilatation   . History of gastritis   . History of hiatal hernia   . History of bladder cancer     hx  TCC low grade  . Kidney tumor     right  . BPH (benign prostatic hyperplasia)   . CKD (chronic kidney disease) stage 3, GFR 30-59 ml/min     a. h/o nephrectomy.  [name] History of kidney stones   . ED (erectile dysfunction)   . S/P CABG x 4     12-18-2013  . Bladder tumor   . DVT (deep venous thrombosis) ([place])     a. s/p IVC filter 09/2014. Xarelto stopped at that time due to hematuria and hemoccult + stools.  . Myocardial infarction ([place]) 12/2013  . Stroke [facility])     TIA  . Chronic combined systolic and diastolic CHF (congestive heart failure) (HCC)     systolic and diastolic heart failure  . Depression   . Hematuria   . Heme positive stool   . Anemia   . Shortness of breath dyspnea     non change in status per dtr-in-low depends on weather and activity   . Type 2 diabetes mellitus (HCC)     borderline on no medications   . Anxiety   . Prostate cancer ([place])     low-grade and bladder cancer and kidney cancer   . OSA on CPAP     mild osa   per sleep study 07-11-2009- patient states does not use regularly   Social History   Social History  . Marital Status: Divorced    Spouse Name: N/A  . Number of Children: 3  . Years of Education: HS   Occupational History  .     Social History Main Topics  . Smoking status: Former Smoker -- 1.00 packs/day for 4 years    Types: Cigarettes    Start date: 09/08/1952    Quit date: 09/09/1959  . Smokeless tobacco: Former Systems developer    Types: [place] date: 01/03/1960  . Alcohol Use: No  . Drug Use: No  . Sexual Activity: Yes    Birth Control/ Protection:  None   Other Topics Concern  . None   Social History Narrative   Patient is single and lives alone.   Patient is retired.   Patient has a high school education.   Patient is right-handed.   Patient drinks one cup of coffee and one cup of soda or tea daily.   Patient has three adult children.      Lives in [place] round the clock care   Family History  Problem Relation Age of Onset  . Cancer - Lung Brother   . Heart disease Mother   .  Heart attack Mother    Scheduled Meds: . feeding supplement (ENSURE ENLIVE)  237 mL Oral BID BM  . furosemide  40 mg Intravenous Daily  . insulin aspart  0-15 Units Subcutaneous TID WC  . levothyroxine  75 mcg Intravenous Daily  . pantoprazole (PROTONIX) IV  40 mg Intravenous QAC breakfast  . sodium chloride  10-40 mL Intracatheter Q12H  . sodium chloride  3 mL Intravenous Q12H   Continuous Infusions: . dextrose 5 % and 0.9% NaCl 75 mL/hr at 02/23/15 0612   PRN Meds:.LORazepam, morphine injection, promethazine, sodium chloride Medications Prior to Admission:  Prior to Admission medications   Medication Sig Start Date End Date Taking? Authorizing Provider  aspirin 81 MG chewable tablet Chew 1 tablet (81 mg total) by mouth daily. 08/20/14  Yes Lezlie Octave Black, NP  donepezil (ARICEPT) 10 MG tablet Take 10 mg by mouth at bedtime.   Yes Historical Provider, MD  escitalopram (LEXAPRO) 20 MG tablet Take 20 mg by mouth every morning.    Yes Historical Provider, MD  levothyroxine (SYNTHROID, LEVOTHROID) 125 MCG tablet Take 125 mcg by mouth daily before breakfast.   Yes Historical Provider, MD  midodrine (PROAMATINE) 10 MG tablet Take 1 tablet (10 mg total) by mouth 3 (three) times daily. 02/18/15  Yes Larey Dresser, MD  mirabegron ER (MYRBETRIQ) 50 MG TB24 tablet Take 1 tablet (50 mg total) by mouth every morning. 12/25/14  Yes Ardis Hughs, MD  ondansetron (ZOFRAN-ODT) 4 MG disintegrating tablet Take 4 mg by mouth every 8 (eight) hours as  needed for nausea or vomiting.   Yes Historical Provider, MD  pantoprazole (PROTONIX) 40 MG tablet TAKE ONE TABLET EVERY MORNING 04/06/14  Yes Butch Penny, NP  polyethylene glycol (MIRALAX / GLYCOLAX) packet Take 17 g by mouth at bedtime.   Yes Historical Provider, MD  potassium chloride SA (K-DUR,KLOR-CON) 20 MEQ tablet Take 2 tabs in AM and 1 tab in PM 02/18/15  Yes Larey Dresser, MD  temazepam (RESTORIL) 15 MG capsule Take 15 mg by mouth at bedtime as needed for sleep.   Yes Sharilyn Sites, MD  torsemide [facility]) 20 MG tablet Take 2 tabs in AM and 1 tab in PM 02/18/15  Yes Larey Dresser, MD  ziprasidone (GEODON) 20 MG capsule Take 20 mg by mouth every morning.  09/15/14  Yes Historical Provider, MD   Allergies  Allergen Reactions  . Statins Other (See Comments)    MUSCLE ACHES   CBC:    Component Value Date/Time   WBC 7.7 02/22/2015 0522   HGB 10.4* 02/22/2015 0522   HCT 36.2* 02/22/2015 0522   PLT 178 02/22/2015 0522   MCV 79.9 02/22/2015 0522   NEUTROABS 6.5 02/20/2015 1013   LYMPHSABS 0.9 02/20/2015 1013   MONOABS 0.9 02/20/2015 1013   EOSABS 0.0 02/20/2015 1013   BASOSABS 0.0 02/20/2015 1013   Comprehensive Metabolic Panel:    Component Value Date/Time   NA 151* 02/23/2015 0612   NA 138 03/01/2010   K 3.3* 02/23/2015 0612   CL 111 02/23/2015 0612   CO2 32 02/23/2015 0612   BUN 22* 02/23/2015 0612   CREATININE 1.81* 02/23/2015 0612   CREATININE 2.39* 09/29/2014 0902   GLUCOSE 126* 02/23/2015 0612   CALCIUM 8.9 02/23/2015 0612   AST 108* 02/21/2015 0557   ALT 112* 02/21/2015 0557   ALKPHOS 59 02/21/2015 0557   BILITOT 1.2 02/21/2015 0557   PROT 5.7* 02/21/2015 0557   ALBUMIN 3.0* 02/21/2015  [id number]    Physical Exam: Vital Signs: BP 132/78 mmHg  Pulse 81  Temp(Src) 98.1 F (36.7 C) (Oral)  Resp 20  Ht 5\' 10"  (1.778 m)  Wt 98.1 kg (216 lb 4.3 oz)  BMI 31.03 kg/m2  SpO2 95% SpO2: SpO2: 95 % O2 Device: O2 Device: Not Delivered O2 Flow Rate: O2 Flow Rate  (L/min): 3 L/min Intake/output summary:  Intake/Output Summary (Last 24 hours) at 02/23/15 1623 Last data filed at 02/23/15 1200  Gross per 24 hour  Intake 1347.5 ml  Output   2200 ml  Net -852.5 ml   LBM: Last BM Date: 02/21/15 Baseline Weight: Weight: 97.523 kg (215 lb) Most recent weight: Weight: 98.1 kg (216 lb 4.3 oz)  Exam Findings:  Constitutional:  Elderly, frail, lying in bed.   Resp: even and non labored Cardio:  No edema GI: abd soft non tender.  Psych: confused, reaching out.          Palliative Performance Scale: 20% at this time.              Additional Data Reviewed: Recent Labs     02/21/15  0557  02/22/15  0522  02/23/15  0612  WBC  7.6  7.7   --   HGB  10.4*  10.4*   --   PLT  191  178   --   NA  148*  150*  151*  BUN  36*  29*  22*  CREATININE  2.14*  2.03*  1.81*     Time In: 1400 Time Out: 1530 Time Total:  90 minutes Greater than 50%  of this time was spent counseling and coordinating care related to the above assessment and plan. GOC discussion shared with nursing staff, CM, SW, and Dr. Jerilee Hoh.   Signed by: Drue Novel, NP  Drue Novel, NP  02/23/2015, 4:23 PM  Please contact Palliative Medicine Team phone at [phone] for questions and concerns.

## 2015-02-23 NOTE — Care Management Note (Addendum)
Case Management Note  Patient Details  Name: [name] [name] MRN: [id number] Date of Birth: 1933-07-23  Subjective/Objective:                  Pt admitted with dysphagia. Pt currently confused and unable to complete assessment. Pt's DIL called and provided information. Pt lives at home alone and has PD aids and 24/7 supervision. Pt has cane, walker, wheelchair and BSC. Pt was referred for [facility] services after last admission, DIL reports pt was unable/unwilling to complete PT in the home and PT services were stopped early, nursing services were also stopped recently. Pt/family happy with [facility] and request they be used again if needed. DIL feels he may need hospital bed at DC. Pt is active with [facility].   Action/Plan: DIL plans on pt returning home at DC with resumption of PD aids. Anticipate need for [facility] nursing services prior to DC. Pt will need PT eval prior to DC to determine functional status at DC and determine what family will need to care for him at home. Will cont to follow for DC planning.   Expected Discharge Date:  02/22/15               Expected Discharge Plan:  [place]  In-House Referral:  NA  Discharge planning Services  CM Consult  Post Acute Care Choice:  Home Health Choice offered to:  Adult Children  DME Arranged:    DME Agency:  [place] Arranged:  RN [facility] Agency:  [place]  Status of Service:  In process, will continue to follow  Medicare Important Message Given:    Date Medicare IM Given:    Medicare IM give by:    Date Additional Medicare IM Given:    Additional Medicare Important Message give by:     If discussed at [place] of Stay Meetings, dates discussed:    Additional Comments:  Sherald Barge, RN 02/23/2015, 11:12 AM

## 2015-02-23 NOTE — Progress Notes (Signed)
[facility] PROGRESS NOTE  [name] [name] [id number] DOB: May 16, 1933 DOA: 02/20/2015 PCP: Purvis Kilts, MD  Assessment/Plan: Dysphagia -Unable to dilate yesterday due to restlessness. -GI believes he may have an esophageal dysmotility disorder. -I would strongly advocate for a palliative care consult to clarify goals of care in this elderly, demented gentleman before placement of a feeding tube. Will request.   Acute on Chronic Diastolic CHF -Over 15 L negative since admission. -Continue lasix, seems close to euvolemia. -Can transition to PO lasix once PO status resumed.  DM II -Well controlled.  Blindness -Noted.  CAD -Stable. -S/p CABG x 4. -no CP reported.  Hypothyroidism -Continue IV synthroid given inadequate PO intake.  Dementia -At baseline. Appears restless and fidgety in bed.  ARF -Improving despite diuresis.  Code Status: DNR Family Communication: No family present at time of exam. Will attempt to call later today.  Disposition Plan: To be determined   Consultants:  None   Antibiotics:  None   Subjective: Restless, fidgeting in bed, mittens on.  Objective: Filed Vitals:   02/22/15 1715 02/22/15 1720 02/23/15 0553 02/23/15 1307  BP: 123/81 124/99 140/84 132/78  Pulse: 87 83 82 81  Temp:   98.2 F (36.8 C) 98.1 F (36.7 C)  TempSrc:   Oral Oral  Resp: 22 16 20 20   Height:      Weight:   98.1 kg (216 lb 4.3 oz)   SpO2: 82% 95% 96% 95%    Intake/Output Summary (Last 24 hours) at 02/23/15 1318 Last data filed at 02/23/15 1200  Gross per 24 hour  Intake 1347.5 ml  Output  12200 ml  Net -10852.5 ml   Filed Weights   02/20/15 0946 02/22/15 0653 02/23/15 0553  Weight: 97.523 kg (215 lb) 97.2 kg (214 lb 4.6 oz) 98.1 kg (216 lb 4.3 oz)    Exam:   General:  Awake, not able to answer questions appropriately.  Cardiovascular: RRR  Respiratory: CTA B  Abdomen: S/NT/ND/+BS  Extremities: trace bilateral edema     Neurologic:  Moves all 4 spontaneously, unable to fully assess given current mental state.  Data Reviewed: Basic Metabolic Panel:  Recent Labs Lab 02/18/15 1245 02/20/15 1013 02/21/15 0557 02/22/15 0522 02/23/15 0612  NA 142 143 148* 150* 151*  K 4.0 3.8 2.7* 3.0* 3.3*  CL 105 104 106 107 111  CO2 25 29 33* 34* 32  GLUCOSE 121* 105* 100* 101* 126*  BUN 29* 39* 36* 29* 22*  CREATININE 2.30* 2.31* 2.14* 2.03* 1.81*  CALCIUM 9.5 9.3 8.7* 8.8* 8.9  MG  --   --  2.1  --   --    Liver Function Tests:  Recent Labs Lab 02/18/15 1245 02/20/15 1013 02/21/15 0557  AST 137* 143* 108*  ALT 98* 129* 112*  ALKPHOS 66 71 59  BILITOT 1.6* 1.5* 1.2  PROT 6.6 6.5 5.7*  ALBUMIN 3.4* 3.4* 3.0*   No results for input(s): LIPASE, AMYLASE in the last 168 hours. No results for input(s): AMMONIA in the last 168 hours. CBC:  Recent Labs Lab 02/18/15 1245 02/20/15 1013 02/21/15 0557 02/22/15 0522  WBC 7.7 8.3 7.6 7.7  NEUTROABS  --  6.5  --   --   HGB 11.2* 11.8* 10.4* 10.4*  HCT 38.2* 40.4 35.3* 36.2*  MCV 79.4 79.7 79.7 79.9  PLT 191 203 191 178   Cardiac Enzymes: No results for input(s): CKTOTAL, CKMB, CKMBINDEX, TROPONINI in the last 168 hours. BNP (last 3  results)  Recent Labs  09/18/14 0130 01/04/15 2140 02/18/15 1245  BNP 2240.0* 2464.0* 3201.1*    ProBNP (last 3 results) No results for input(s): PROBNP in the last 8760 hours.  CBG:  Recent Labs Lab 02/22/15 1147 02/22/15 1753 02/22/15 2133 02/23/15 0711 02/23/15 1115  GLUCAP 114* 96 107* 118* 124*    No results found for this or any previous visit (from the past 240 hour(s)).   Studies: No results found.  Scheduled Meds: . furosemide  40 mg Intravenous Daily  . insulin aspart  0-15 Units Subcutaneous TID WC  . levothyroxine  75 mcg Intravenous Daily  . pantoprazole (PROTONIX) IV  40 mg Intravenous QAC breakfast  . sodium chloride  10-40 mL Intracatheter Q12H  . sodium chloride  3 mL  Intravenous Q12H   Continuous Infusions: . dextrose 5 % and 0.9% NaCl 75 mL/hr at 02/23/15 0612    Principal Problem:   Dysphagia Active Problems:   Hyperlipidemia   S/P CABG x 4   Prolonged Q-T interval on ECG   DM type 2 (diabetes mellitus, type 2) (HCC)   Blindness   Chronic diastolic CHF (congestive heart failure) ([place])   Esophageal stricture    Time spent: 25 minutes. Greater than 50% of this time was spent in direct contact with the patient coordinating care.    Lelon Frohlich  [facility] Pager [phone]  If 7PM-7AM, please contact night-coverage at [email or web address], password [facility] 02/23/2015, 1:18 PM  LOS: 2 days

## 2015-02-23 NOTE — Consult Note (Signed)
   [facility] CM Inpatient Consult   02/23/2015  [name] [name] 12-21-1933 [id number]   Patient is currently active with [facility] for chronic disease management services.  Patient has been engaged by a [employer].  Our community based plan of care has focused on disease management and community resource support.  Patient will receive a post discharge transition of care call and will be evaluated for monthly home visits for assessments and disease process education.  Made Inpatient Case Manager aware that [name] following. Of note, [name] services does not replace or interfere with any services that are arranged by inpatient case management or social work.   For additional questions or referrals please contact:  Royetta Crochet. Laymond Purser, RN, BSN, [facility] Liaison [phone]

## 2015-02-23 NOTE — Progress Notes (Signed)
Patient remains agitated this morning and fidgeting in the bed. Will open eyes at times, but not really on command.  Mumbling but not conversing.  NT attempted to feed.  Patient took a couple of small sips of OJ and a bite of oatmeal.  Appeared to swallow slowly, but with out complication.  Patient refuses and will not cooperate to eat any more at this time.  Family at bedside, states she will attempt to give patient small bites when awake enough and cooperative.  Will try to hold off on any sedation so that we can assess his intake better.

## 2015-02-23 NOTE — Progress Notes (Signed)
Initial Nutrition Assessment  DOCUMENTATION CODES:  Pt meets criteria for severe MALNUTRITION in the context of acute illness as evidenced by wt loss of >2% in 1 week and energy intake </= 50% for >/=5 days   INTERVENTION:  Ensure Enlive po BID, each supplement provides 350 kcal and 20 grams of protein   NUTRITION DIAGNOSIS:    Inadequate oral intake related to dysphagia, and gastroenteritis  as evidenced by pt hx    GOAL: Pt to meet >/= 90% of their estimated nutrition needs      MONITOR: follow for goals of care, po intake, labs and wt trends     REASON FOR ASSESSMENT:   Malnutrition Screening Tool    ASSESSMENT:  Pt has hx of dementia. Vomiting 3 days prior to admission. His diet is advanced now to full liquids but his po intake is very poor. Breakfast observed this morning 60 ml orange juice consumed and 2-3 bites of oatmeal. His lunch documented 25%. Dehydrated on admission and significant weight loss noted 3%in 1 week which is severe. Pt had EGD yesterday but was unable to have dilation as planned due to his lack of cooperation. Pt is restless and has mitts on his hands. Unable to participate in nutrition focused physical exam.  Palliative consult pending to address goals of care. He is not a good candidate for feeding tube due to his dementia. Question whether he will be able to meet his nutrition needs orally. Daughter in law says he will drink Ensure which will be provided while family is discussing goals of care.   Diet Order:  Diet full liquid Room service appropriate?: Yes; Fluid consistency:: Thin  Skin:   dry, intact  Last BM:   11/13  Height:   Ht Readings from Last 1 Encounters:  02/20/15 5\' 10"  (1.778 m)    Weight:   Wt Readings from Last 1 Encounters:  02/23/15 216 lb 4.3 oz (98.1 kg)    Ideal Body Weight:  75.4 kg  BMI:  Body mass index is 31.03 kg/(m^2). obesity class I  Estimated Nutritional Needs:   Kcal:  [id number]  Protein:  112.5  gr  Fluid:  1.9-2.3 liters daily  EDUCATION NEEDS:    Colman Cater MS,RD,CSG,LDN Office: [phone] Pager: [phone]

## 2015-02-24 ENCOUNTER — Encounter (HOSPITAL_COMMUNITY): Payer: Self-pay | Admitting: Internal Medicine

## 2015-02-24 DIAGNOSIS — Z7189 Other specified counseling: Secondary | ICD-10-CM | POA: Insufficient documentation

## 2015-02-24 LAB — GLUCOSE, CAPILLARY
GLUCOSE-CAPILLARY: 123 mg/dL — AB (ref 65–99)
GLUCOSE-CAPILLARY: 96 mg/dL (ref 65–99)
Glucose-Capillary: 113 mg/dL — ABNORMAL HIGH (ref 65–99)
Glucose-Capillary: 108 mg/dL — ABNORMAL HIGH (ref 65–99)

## 2015-02-24 LAB — SURGICAL PCR SCREEN
MRSA, PCR: NEGATIVE
STAPHYLOCOCCUS AUREUS: NEGATIVE

## 2015-02-24 MED ORDER — SODIUM CHLORIDE 0.9 % IV SOLN: INTRAVENOUS | Status: DC

## 2015-02-24 MED ORDER — POTASSIUM CHLORIDE 10 MEQ/100ML IV SOLN
10.0000 meq | INTRAVENOUS | Status: AC
  Administered 2015-02-24 (×2): 10 meq via INTRAVENOUS
  Filled 2015-02-24: qty 100

## 2015-02-24 MED ORDER — DEXTROSE 5 % IV SOLN
INTRAVENOUS | Status: DC
  Administered 2015-02-24: 1 mL via INTRAVENOUS
  Administered 2015-02-25: 06:00:00 via INTRAVENOUS
  Administered 2015-02-25: 1 mL via INTRAVENOUS
  Administered 2015-02-26 – 2015-02-27 (×3): via INTRAVENOUS

## 2015-02-24 NOTE — Progress Notes (Signed)
Daily Progress Note   Patient Name: [name] [name]       Date: 02/24/2015 DOB: 22-Jun-1933  Age: 81 y.o. MRN#: [id number] Attending Physician: [place] Physician: Purvis Kilts, MD Admit Date: 02/20/2015  Reason for Consultation/Follow-up: Establishing goals of care and Psychosocial/spiritual support  Subjective: Phone call with son/HCPOA [facility]. I ask [name] [name] to share what he knows about his fathers health issues, and he shares CABG, HF, Kidney cancer, and his poor intake, but not his dementia.  I ask about functional status, and [name] [name] tells me that [name] [name] "cant get out and do anything".  He is SOB with bathing, and walking in the home.  [name] [name] tells me that they talked about AD before the CABG in 2015, and [name] [name] "more or less didn't want to be hooked up to machines".  He talks about his father having 2 or 3 hard times, but that he came back better.  I describe the chronic illness trajectory.    He does tells me that in regards to tube feeding, his father 'didn't want to live like that", but [name] [name] feels that he needs to do all he can "not pull the switch too quick".  [name] [name] shares that he would NOT want permanent feeding tube, and realizes it must stay in at least 2 weeks. He shares that he is worried [name] [name] may pull out the tube in confusion.   I share my worry over [name] [name]'s lack of improvement in cognition and what a feeding tube would mean for his quality.  I share options ranging from; return to near base line (home with skilled caregivers 24/7), to SNF for rehab/placement, to in patient Hospice. [name] [name] tells me that he has talked with his brothers, one wouldn't say anything, and the other brother "unsure".  I share that [name] [name] has the right and responsibility to make sure his fathers wishes are followed.       Length of Stay: 3 days  Current Medications: Scheduled Meds:  . feeding supplement (ENSURE ENLIVE)  237 mL Oral BID BM  . furosemide   40 mg Intravenous Daily  . insulin aspart  0-15 Units Subcutaneous TID WC  . levothyroxine  75 mcg Intravenous Daily  . pantoprazole (PROTONIX) IV  40 mg Intravenous QAC breakfast  . sodium chloride  10-40 mL Intracatheter Q12H  . sodium chloride  3 mL Intravenous Q12H    Continuous Infusions: . dextrose 5 % and 0.9% NaCl 75 mL/hr at 02/24/15 1111    PRN Meds: LORazepam, morphine injection, ondansetron (ZOFRAN) IV, promethazine, sodium chloride  Palliative Performance Scale: 20% at this time.      Vital Signs: BP 101/90 mmHg  Pulse 82  Temp(Src) 97.5 F (36.4 C) (Oral)  Resp 20  Ht 5\' 10"  (1.778 m)  Wt 98.9 kg (218 lb 0.6 oz)  BMI 31.28 kg/m2  SpO2 96% SpO2: SpO2: 96 % O2 Device: O2 Device: Not Delivered O2 Flow Rate: O2 Flow Rate (L/min): 3 L/min  Intake/output summary:  Intake/Output Summary (Last 24 hours) at 02/24/15 1407 Last data filed at 02/24/15 0800  Gross per 24 hour  Intake 1721.25 ml  Output   1650 ml  Net  71.25 ml   LBM:   Baseline Weight: Weight: 97.523 kg (215 lb) Most recent weight: Weight: 98.9 kg (218 lb 0.6 oz)          Additional Data Reviewed: Recent Labs     02/22/15  0522  02/23/15  0612  WBC  7.7   --   HGB  10.4*   --   PLT  178   --   NA  150*  151*  BUN  29*  22*  CREATININE  2.03*  1.81*     Problem List:  Patient Active Problem List   Diagnosis Date Noted  . DNR (do not resuscitate) discussion   . Esophageal stricture 02/20/2015  . Acute on chronic combined systolic and diastolic CHF (congestive heart failure) ([place]) 02/18/2015  . Acute exacerbation of CHF (congestive heart failure) ([place]) 01/05/2015  . Acute on chronic combined systolic and diastolic congestive heart failure ([place]) 01/05/2015  . Acute diastolic congestive heart failure ([place])   . Acute CHF ([place]) 01/04/2015  . Bladder cancer ([place]) 12/24/2014  . AKI (acute kidney injury) c Cardiorenal syndrome 10/02/2014  . Postprocedural Bleeding 09/18/2014  . DVT (deep  venous thrombosis) ([place])   . Hematuria 09/02/2014  . Pulmonary embolism suspected 08/20/2014  . Congestive heart disease ([place])   . Lower leg DVT (deep venous thrombosis) ([place])   . Protein-calorie malnutrition, severe ([place]) 08/18/2014  . CHF (congestive heart failure) ([place]) 08/17/2014  . Acute renal failure superimposed on stage 3 chronic kidney disease ([place]) 08/17/2014  . Acute on chronic systolic and diastolic heart failure, NYHA class 3 ([place]) 08/17/2014  . CKD (chronic kidney disease) stage 3, GFR 30-59 ml/min   . Palliative care encounter   . Obstructive uropathy   . Urinary tract infectious disease   . Blindness   . NSTEMI (non-ST elevated myocardial infarction) ([place])   . Renal insufficiency   . S/p nephrectomy 06/12/2014  . Acute renal failure ([place]) 06/12/2014  . UTI (lower urinary tract infection) 06/12/2014  . Metabolic encephalopathy [id number]  . Elevated troponin 06/12/2014  . Altered mental state 06/12/2014  . Altered mental status   . Transitional cell carcinoma of kidney ([place]) 06/05/2014  . TIA (transient ischemic attack) 01/23/2014  . Dementia without behavioral disturbance 01/23/2014  . Prolonged Q-T interval on ECG 01/23/2014  . DM type 2 (diabetes mellitus, type 2) ([place]) 01/23/2014  . Anemia, post op 01/13/2014  . CAD, multiple vessel 12/18/2013  . S/P CABG x 4 12/18/2013  . ST elevation myocardial infarction (STEMI) of inferior wall, initial episode of care ([place]) 12/17/2013  . Hypoxemia 11/19/2013  . Orthostatic hypotension 11/19/2013  . Dysphagia 12/12/2012  . Dizziness 11/07/2012  . Hyperlipidemia 11/07/2012  . Obstructive sleep apnea 11/07/2012     Palliative Care Assessment & Plan    Code Status:  DNR   FEEDING TUBE: temporary basis, not permanent.   Goals of Care:  Esophageal stretching tomorrow, with goal for pt to self feed.   Disposition, unsure at this time   Symptom Management:  Morphine 2 mg IV Q 4 hours PRN.   Palliative  Prophylaxis:  None at this time.   Psycho-social/Spiritual:  Desire for further Chaplaincy support:no   Prognosis: Unable to determine Discharge Planning: Undecided, discussed return to home, SNF, Hospice.    Care plan was discussed with Nursing staff, CM, SW, and Dr. Jerilee Hoh.   Thank you for allowing the Palliative Medicine Team to assist in the care of this patient.   Time In: 1230 Time Out: 1330 Total Time 60 minutes Prolonged Time Billed  no     Greater than 50%  of this time was spent counseling and coordinating care related to the above assessment and plan.   Drue Novel, NP  02/24/2015,  2:07 PM  Please contact Palliative Medicine Team phone at [phone] for questions and concerns.

## 2015-02-24 NOTE — Progress Notes (Signed)
PT Cancellation Note  Patient Details Name: [name] [name] MRN: [id number] DOB: 13-Aug-1933   Cancelled Treatment:    Reason Eval/Treat Not Completed: Patient declined, no reason specified;Patient's level of consciousness;Fatigue/lethargy limiting ability to participate. Chart reviewed, RN consulted. Pt is somnolent, and confused, with regular grimacing and c/o penis pain. Pt is not responding consistently to verbal cues, nor is he able to follow simple one step commands more than 10% of the time. Will wait until family is available to determine baseline cognition and mobility. Hx of dementia.    Trevel Dillenbeck C 02/24/2015, 10:50 AM  10:52 AM  Etta Grandchild, PT, DPT [place] License # [id number]

## 2015-02-24 NOTE — Progress Notes (Signed)
Physical Therapy called, attempted to work with patient, reported patient would not follow commands and no family in room.

## 2015-02-24 NOTE — Progress Notes (Addendum)
[facility] PROGRESS NOTE  [name] [name] [id number] DOB: 1933/11/15 DOA: 02/20/2015 PCP: Purvis Kilts, MD  Assessment/Plan: Dysphagia -GI believes he may have an esophageal dysmotility disorder. Likely on top of swallowing dysfunction seen with dementia. -Appreciate palliative care input and recommendations. We will need to continue to talk with son [name]) to continue to delineate GOC. It appears rest of family members including patient's DIL [name], who is the main caregiver, are all in agreement to hold off on feeding tube. -If they decide to proceed with feeding tube, will discuss with GI to see if can be done this admission.  Acute on Chronic Diastolic CHF -Over 15 L negative since admission. -Lasix on hold due to hypernatremia; follow volume status closely. -Can transition to PO lasix once PO status resumed.  Hypernatremia -Na 150. -Change IVF to D5. -DC lasix -Due to free water deficit given lack of PO intake. -Recheck in am.  Hypokalemia -Replete orally. -Mg 2.1 11/13.  DM II -Well controlled.  Blindness -Noted.  CAD -Stable. -S/p CABG x 4. -no CP reported.  Hypothyroidism -Continue IV synthroid given inadequate PO intake.  Dementia -At baseline. Appears restless and fidgety in bed.  ARF -Improving despite diuresis.  Code Status: DNR Family Communication: DIL Michelle at bedside updated on plan of care.  Disposition Plan: To be determined   Consultants:  None   Antibiotics:  None   Subjective: Restless, fidgeting in bed, mittens on.  Objective: Filed Vitals:   02/23/15 1307 02/23/15 2113 02/24/15 0544 02/24/15 1300  BP: 132/78 136/88 126/82 101/90  Pulse: 81 83 82 82  Temp: 98.1 F (36.7 C) 98.7 F (37.1 C) 97.5 F (36.4 C)   TempSrc: Oral Oral Oral   Resp: 20 20 20    Height:      Weight:   98.9 kg (218 lb 0.6 oz)   SpO2: 95% 96% 97% 96%    Intake/Output Summary (Last 24 hours) at 02/24/15  1548 Last data filed at 02/24/15 0800  Gross per 24 hour  Intake 1721.25 ml  Output   1650 ml  Net  71.25 ml   Filed Weights   02/22/15 0653 02/23/15 0553 02/24/15 0544  Weight: 97.2 kg (214 lb 4.6 oz) 98.1 kg (216 lb 4.3 oz) 98.9 kg (218 lb 0.6 oz)    Exam:   General:  Awake, not able to answer questions appropriately.  Cardiovascular: RRR  Respiratory: CTA B  Abdomen: S/NT/ND/+BS  Extremities: trace bilateral edema   Neurologic:  Moves all 4 spontaneously, unable to fully assess given current mental state.  Data Reviewed: Basic Metabolic Panel:  Recent Labs Lab 02/18/15 1245 02/20/15 1013 02/21/15 0557 02/22/15 0522 02/23/15 0612  NA 142 143 148* 150* 151*  K 4.0 3.8 2.7* 3.0* 3.3*  CL 105 104 106 107 111  CO2 25 29 33* 34* 32  GLUCOSE 121* 105* 100* 101* 126*  BUN 29* 39* 36* 29* 22*  CREATININE 2.30* 2.31* 2.14* 2.03* 1.81*  CALCIUM 9.5 9.3 8.7* 8.8* 8.9  MG  --   --  2.1  --   --    Liver Function Tests:  Recent Labs Lab 02/18/15 1245 02/20/15 1013 02/21/15 0557  AST 137* 143* 108*  ALT 98* 129* 112*  ALKPHOS 66 71 59  BILITOT 1.6* 1.5* 1.2  PROT 6.6 6.5 5.7*  ALBUMIN 3.4* 3.4* 3.0*   No results for input(s): LIPASE, AMYLASE in the last 168 hours. No results for input(s): AMMONIA in the last  168 hours. CBC:  Recent Labs Lab 02/18/15 1245 02/20/15 1013 02/21/15 0557 02/22/15 0522  WBC 7.7 8.3 7.6 7.7  NEUTROABS  --  6.5  --   --   HGB 11.2* 11.8* 10.4* 10.4*  HCT 38.2* 40.4 35.3* 36.2*  MCV 79.4 79.7 79.7 79.9  PLT 191 203 191 178   Cardiac Enzymes: No results for input(s): CKTOTAL, CKMB, CKMBINDEX, TROPONINI in the last 168 hours. BNP (last 3 results)  Recent Labs  09/18/14 0130 01/04/15 2140 02/18/15 1245  BNP 2240.0* 2464.0* 3201.1*    ProBNP (last 3 results) No results for input(s): PROBNP in the last 8760 hours.  CBG:  Recent Labs Lab 02/23/15 1115 02/23/15 1609 02/23/15 2112 02/24/15 0755 02/24/15 1108   GLUCAP 124* 83 93 113* 123*    No results found for this or any previous visit (from the past 240 hour(s)).   Studies: No results found.  Scheduled Meds: . feeding supplement (ENSURE ENLIVE)  237 mL Oral BID BM  . insulin aspart  0-15 Units Subcutaneous TID WC  . levothyroxine  75 mcg Intravenous Daily  . pantoprazole (PROTONIX) IV  40 mg Intravenous QAC breakfast  . sodium chloride  10-40 mL Intracatheter Q12H  . sodium chloride  3 mL Intravenous Q12H   Continuous Infusions: . dextrose 1 mL (02/24/15 1544)    Principal Problem:   Dysphagia Active Problems:   Acute on chronic combined systolic and diastolic CHF (congestive heart failure) (HCC)   Hyperlipidemia   S/P CABG x 4   Prolonged Q-T interval on ECG   DM type 2 (diabetes mellitus, type 2) ([place])   Blindness   Esophageal stricture   DNR (do not resuscitate) discussion    Time spent: 25 minutes. Greater than 50% of this time was spent in direct contact with the patient coordinating care.    Lelon Frohlich  [facility] Pager [phone]  If 7PM-7AM, please contact night-coverage at [email or web address], password [email or web address] 02/24/2015, 3:48 PM  LOS: 3 days

## 2015-02-24 NOTE — Progress Notes (Signed)
Patient remains with confusion and restlessness. Remains with hyponatremia and hypokalemia. In addition discuss with Dr. Jerilee Hoh earlier today. Condition reviewed with patient's daughter-in-law who is in bed site. As requested by patient's family members will proceed with EGD under monitored anesthesia care tomorrow.

## 2015-02-25 ENCOUNTER — Inpatient Hospital Stay (HOSPITAL_COMMUNITY): Payer: Medicare Other | Admitting: Anesthesiology

## 2015-02-25 ENCOUNTER — Encounter (HOSPITAL_COMMUNITY)
Admission: EM | Disposition: A | Discharge status: Hospice | Payer: Self-pay | Source: Home / Self Care | Attending: Internal Medicine

## 2015-02-25 ENCOUNTER — Encounter (HOSPITAL_COMMUNITY): Payer: Self-pay | Admitting: *Deleted

## 2015-02-25 ENCOUNTER — Ambulatory Visit (HOSPITAL_COMMUNITY): Admission: RE | Admit: 2015-02-25 | Payer: Medicare Other | Source: Ambulatory Visit | Admitting: Internal Medicine

## 2015-02-25 ENCOUNTER — Encounter (HOSPITAL_COMMUNITY): Admission: RE | Payer: Self-pay | Source: Ambulatory Visit

## 2015-02-25 DIAGNOSIS — K222 Esophageal obstruction: Secondary | ICD-10-CM

## 2015-02-25 HISTORY — PX: ESOPHAGEAL DILATION: SHX303

## 2015-02-25 HISTORY — PX: ESOPHAGOGASTRODUODENOSCOPY (EGD) WITH PROPOFOL: SHX5813

## 2015-02-25 LAB — BASIC METABOLIC PANEL
Anion gap: 7 (ref 5–15)
BUN: 18 mg/dL (ref 6–20)
CALCIUM: 8.9 mg/dL (ref 8.9–10.3)
CO2: 29 mmol/L (ref 22–32)
CREATININE: 1.51 mg/dL — AB (ref 0.61–1.24)
Chloride: 112 mmol/L — ABNORMAL HIGH (ref 101–111)
GFR calc non Af Amer: 42 mL/min — ABNORMAL LOW (ref >60)
GFR, EST AFRICAN AMERICAN: 49 mL/min — AB (ref >60)
GLUCOSE: 100 mg/dL — AB (ref 65–99)
Potassium: 3.6 mmol/L (ref 3.5–5.1)
Sodium: 148 mmol/L — ABNORMAL HIGH (ref 135–145)

## 2015-02-25 LAB — GLUCOSE, CAPILLARY
GLUCOSE-CAPILLARY: 109 mg/dL — AB (ref 65–99)
GLUCOSE-CAPILLARY: 90 mg/dL (ref 65–99)
Glucose-Capillary: 94 mg/dL (ref 65–99)
Glucose-Capillary: 88 mg/dL (ref 65–99)

## 2015-02-25 SURGERY — ESOPHAGOGASTRODUODENOSCOPY (EGD) WITH PROPOFOL
Anesthesia: Monitor Anesthesia Care

## 2015-02-25 SURGERY — EGD (ESOPHAGOGASTRODUODENOSCOPY)
Anesthesia: Moderate Sedation

## 2015-02-25 MED ORDER — PROPOFOL 10 MG/ML IV BOLUS
INTRAVENOUS | Status: AC
  Filled 2015-02-25: qty 20

## 2015-02-25 MED ORDER — ONDANSETRON HCL 4 MG/2ML IJ SOLN: 4.0000 mg | Freq: Once | INTRAMUSCULAR | Status: DC | PRN

## 2015-02-25 MED ORDER — LIDOCAINE HCL (PF) 1 % IJ SOLN
INTRAMUSCULAR | Status: AC
  Filled 2015-02-25: qty 5

## 2015-02-25 MED ORDER — LACTATED RINGERS IV SOLN
INTRAVENOUS | Status: DC
  Administered 2015-02-25: 1000 mL via INTRAVENOUS

## 2015-02-25 MED ORDER — FENTANYL CITRATE (PF) 100 MCG/2ML IJ SOLN
25.0000 ug | INTRAMUSCULAR | Status: AC
  Administered 2015-02-25 (×2): 25 ug via INTRAVENOUS

## 2015-02-25 MED ORDER — FENTANYL CITRATE (PF) 100 MCG/2ML IJ SOLN
INTRAMUSCULAR | Status: AC
  Filled 2015-02-25: qty 2

## 2015-02-25 MED ORDER — PROPOFOL 10 MG/ML IV BOLUS
INTRAVENOUS | Status: DC | PRN
  Administered 2015-02-25: 10 mg via INTRAVENOUS

## 2015-02-25 MED ORDER — STERILE WATER FOR IRRIGATION IR SOLN
Status: DC | PRN
  Administered 2015-02-25: 1000 mL

## 2015-02-25 MED ORDER — PROPOFOL 500 MG/50ML IV EMUL
INTRAVENOUS | Status: DC | PRN
  Administered 2015-02-25: 50 ug/kg/min via INTRAVENOUS

## 2015-02-25 MED ORDER — FENTANYL CITRATE (PF) 100 MCG/2ML IJ SOLN: 25.0000 ug | INTRAMUSCULAR | Status: DC | PRN

## 2015-02-25 SURGICAL SUPPLY — 32 items
BALLN CRE LF 10-12 240X5.5 (BALLOONS)
BALLN CRE LF 10-12MM 240X5.5 (BALLOONS)
BALLN DILATOR CRE 12-15 240 (BALLOONS)
BALLN DILATOR CRE 15-18 240 (BALLOONS) IMPLANT
BALLN DILATOR CRE 18-20 240 (BALLOONS) IMPLANT
BALLN DILATOR CRE WIREGUIDE (BALLOONS)
BALLOON CRE LF 10-12 240X5.5 (BALLOONS) IMPLANT
BALLOON DILATOR CRE 12-15 240 (BALLOONS) IMPLANT
BALLOON DILATOR CRE WIREGUIDE (BALLOONS) IMPLANT
BLOCK BITE 60FR ADLT L/F BLUE (MISCELLANEOUS) ×2 IMPLANT
ELECT REM PT RETURN 9FT ADLT (ELECTROSURGICAL)
ELECTRODE REM PT RTRN 9FT ADLT (ELECTROSURGICAL) IMPLANT
FLOOR PAD 36X40 (MISCELLANEOUS)
FORCEP COLD BIOPSY (CUTTING FORCEPS) IMPLANT
FORCEPS BIOP RAD 4 LRG CAP 4 (CUTTING FORCEPS) IMPLANT
FORMALIN 10 PREFIL 20ML (MISCELLANEOUS) IMPLANT
KIT ENDO PROCEDURE PEN (KITS) ×3 IMPLANT
MANIFOLD NEPTUNE II (INSTRUMENTS) ×3 IMPLANT
NDL SCLEROTHERAPY 25GX240 (NEEDLE) IMPLANT
NEEDLE SCLEROTHERAPY 25GX240 (NEEDLE) IMPLANT
PAD FLOOR 36X40 (MISCELLANEOUS) IMPLANT
PROBE APC STR FIRE (PROBE) IMPLANT
PROBE INJECTION GOLD (MISCELLANEOUS)
PROBE INJECTION GOLD 7FR (MISCELLANEOUS) IMPLANT
SNARE ROTATE MED OVAL 20MM (MISCELLANEOUS) IMPLANT
SNARE SHORT THROW 13M SML OVAL (MISCELLANEOUS) ×1 IMPLANT
SYR 50ML LL SCALE MARK (SYRINGE) ×2 IMPLANT
SYR INFLATE BILIARY GAUGE (MISCELLANEOUS) IMPLANT
SYR INFLATION 60ML (SYRINGE) IMPLANT
TUBING INSUFFLATOR CO2MPACT (TUBING) ×3 IMPLANT
TUBING IRRIGATION ENDOGATOR (MISCELLANEOUS) ×2 IMPLANT
WATER STERILE IRR 1000ML POUR (IV SOLUTION) ×2 IMPLANT

## 2015-02-25 NOTE — Progress Notes (Signed)
Agitated, restless, yelling out "Help me".  Family at bedside, unable to calm patient, requesting pain med and anxiety med.  Ativan and Morphine given.  Will continue to monitor.

## 2015-02-25 NOTE — Progress Notes (Signed)
Lab to room, ordered labs collected from PICC line.

## 2015-02-25 NOTE — Progress Notes (Signed)
Received report from OR, patient tolerated procedure, LR infusing, patient agitated pulling at foley, mitts in use.

## 2015-02-25 NOTE — Anesthesia Postprocedure Evaluation (Signed)
  Anesthesia Post-op Note  Patient: [name] [name]  Procedure(s) Performed: Procedure(s): ESOPHAGOGASTRODUODENOSCOPY (EGD) WITH PROPOFOL (N/A) ESOPHAGEAL DILATION 57 FRENCH, 19 FRENCH (N/A)  Patient Location: PACU  Anesthesia Type:MAC  Level of Consciousness: patient cooperative  Airway and Oxygen Therapy: Patient Spontanous Breathing  Post-op Pain: none  Post-op Assessment: Post-op Vital signs reviewed, Patient's Cardiovascular Status Stable, Respiratory Function Stable and Patent Airway              Post-op Vital Signs: Reviewed and stable  Last Vitals:  Filed Vitals:   02/25/15 0825  BP:   Pulse: 77  Temp:   Resp: 21    Complications: No apparent anesthesia complications

## 2015-02-25 NOTE — Transfer of Care (Signed)
Immediate Anesthesia Transfer of Care Note  Patient: [name] [name]  Procedure(s) Performed: Procedure(s): ESOPHAGOGASTRODUODENOSCOPY (EGD) WITH PROPOFOL (N/A) ESOPHAGEAL DILATION 88 FRENCH, 57 FRENCH (N/A)  Patient Location: PACU  Anesthesia Type:MAC  Level of Consciousness: sedated and patient cooperative  Airway & Oxygen Therapy: Patient Spontanous Breathing and non-rebreather face mask  Post-op Assessment: Report given to RN, Post -op Vital signs reviewed and stable and Patient moving all extremities  Post vital signs: Reviewed and stable   Complications: No apparent anesthesia complications

## 2015-02-25 NOTE — Progress Notes (Signed)
[facility] PROGRESS NOTE  [name] [name] [id number] DOB: 02-23-1934 DOA: 02/20/2015 PCP: Purvis Kilts, MD  Assessment/Plan: Dysphagia -GI believes he may have an esophageal dysmotility disorder. Likely on top of swallowing dysfunction seen with dementia. -S/p esophageal dilatation today. -Appreciate palliative care input and recommendations. We will need to continue to talk with son [name]) to continue to delineate GOC. It appears rest of family members including patient's DIL [name], who is the main caregiver, are all in agreement to hold off on feeding tube.  Acute on Chronic Diastolic CHF -123456 L negative since admission. -Lasix on hold due to hypernatremia; follow volume status closely. -Can transition to PO lasix once PO status resumed.  Hypernatremia -Na down to 148 today. -Continue D5. -DC lasix -Due to free water deficit given lack of PO intake. -Recheck in am.  Hypokalemia -Repleted -Mg 2.1 11/13.  DM II -Well controlled.  Blindness -Noted.  CAD -Stable. -S/p CABG x 4. -no CP reported.  Hypothyroidism -Continue IV synthroid given inadequate PO intake.  Dementia -At baseline. Appears restless and fidgety in bed.  ARF -Improving despite diuresis.  Code Status: DNR Family Communication: DIL Michelle at bedside updated on plan of care.  Disposition Plan: To be determined   Consultants:  None   Antibiotics:  None   Subjective: Restless, fidgeting in bed, mittens on.  Objective: Filed Vitals:   02/25/15 0815 02/25/15 0820 02/25/15 0825 02/25/15 1507  BP: 99/69   135/72  Pulse: 70 75 77 94  Temp:      TempSrc:      Resp: 20 17 21 20   Height:      Weight:      SpO2: 100% 89% 89% 95%    Intake/Output Summary (Last 24 hours) at 02/25/15 1544 Last data filed at 02/25/15 1009  Gross per 24 hour  Intake 1472.5 ml  Output    550 ml  Net  922.5 ml   Filed Weights   02/23/15 0553 02/24/15 0544 02/25/15 0432    Weight: 98.1 kg (216 lb 4.3 oz) 98.9 kg (218 lb 0.6 oz) 91.8 kg (202 lb 6.1 oz)    Exam:   General:  Awake, not able to answer questions appropriately.  Cardiovascular: RRR  Respiratory: CTA B  Abdomen: S/NT/ND/+BS  Extremities: trace bilateral edema   Neurologic:  Moves all 4 spontaneously, unable to fully assess given current mental state.  Data Reviewed: Basic Metabolic Panel:  Recent Labs Lab 02/20/15 1013 02/21/15 0557 02/22/15 0522 02/23/15 0612 02/25/15 0911  NA 143 148* 150* 151* 148*  K 3.8 2.7* 3.0* 3.3* 3.6  CL 104 106 107 111 112*  CO2 29 33* 34* 32 29  GLUCOSE 105* 100* 101* 126* 100*  BUN 39* 36* 29* 22* 18  CREATININE 2.31* 2.14* 2.03* 1.81* 1.51*  CALCIUM 9.3 8.7* 8.8* 8.9 8.9  MG  --  2.1  --   --   --    Liver Function Tests:  Recent Labs Lab 02/20/15 1013 02/21/15 0557  AST 143* 108*  ALT 129* 112*  ALKPHOS 71 59  BILITOT 1.5* 1.2  PROT 6.5 5.7*  ALBUMIN 3.4* 3.0*   No results for input(s): LIPASE, AMYLASE in the last 168 hours. No results for input(s): AMMONIA in the last 168 hours. CBC:  Recent Labs Lab 02/20/15 1013 02/21/15 0557 02/22/15 0522  WBC 8.3 7.6 7.7  NEUTROABS 6.5  --   --   HGB 11.8* 10.4* 10.4*  HCT 40.4 35.3* 36.2*  MCV 79.7 79.7 79.9  PLT 203 191 178   Cardiac Enzymes: No results for input(s): CKTOTAL, CKMB, CKMBINDEX, TROPONINI in the last 168 hours. BNP (last 3 results)  Recent Labs  09/18/14 0130 01/04/15 2140 02/18/15 1245  BNP 2240.0* 2464.0* 3201.1*    ProBNP (last 3 results) No results for input(s): PROBNP in the last 8760 hours.  CBG:  Recent Labs Lab 02/24/15 1108 02/24/15 1633 02/24/15 2033 02/25/15 0710 02/25/15 1137  GLUCAP 123* 108* 96 109* 90    Recent Results (from the past 240 hour(s))  Surgical pcr screen     Status: None   Collection Time: 02/24/15  9:35 PM  Result Value Ref Range Status   MRSA, PCR NEGATIVE NEGATIVE Final   Staphylococcus aureus NEGATIVE  NEGATIVE Final    Comment:        The Xpert SA Assay (FDA approved for NASAL specimens in patients over 21 years of age), is one component of a comprehensive surveillance program.  Test performance has been validated by [facility] for patients greater than or equal to 1 year old. It is not intended to diagnose infection nor to guide or monitor treatment.      Studies: No results found.  Scheduled Meds: . feeding supplement (ENSURE ENLIVE)  237 mL Oral BID BM  . insulin aspart  0-15 Units Subcutaneous TID WC  . levothyroxine  75 mcg Intravenous Daily  . pantoprazole (PROTONIX) IV  40 mg Intravenous QAC breakfast  . sodium chloride  10-40 mL Intracatheter Q12H  . sodium chloride  3 mL Intravenous Q12H   Continuous Infusions: . dextrose 1 mL (02/25/15 1436)    Principal Problem:   Dysphagia Active Problems:   Acute on chronic combined systolic and diastolic CHF (congestive heart failure) (HCC)   Hyperlipidemia   S/P CABG x 4   Prolonged Q-T interval on ECG   DM type 2 (diabetes mellitus, type 2) ([place])   Blindness   Esophageal stricture   DNR (do not resuscitate) discussion    Time spent: 25 minutes. Greater than 50% of this time was spent in direct contact with the patient coordinating care.    Lelon Frohlich  [facility] Pager [phone]  If 7PM-7AM, please contact night-coverage at [email or web address], password [facility] 02/25/2015, 3:44 PM  LOS: 4 days

## 2015-02-25 NOTE — Care Management Note (Signed)
Case Management Note  Patient Details  Name: [name] [name] MRN: [id number] Date of Birth: 1933/04/19  Expected Discharge Date:  02/22/15               Expected Discharge Plan:  [place]  In-House Referral:  NA  Discharge planning Services  CM Consult  Post Acute Care Choice:  Home Health Choice offered to:  Adult Children  DME Arranged:    DME Agency:  [place] Arranged:  RN [name] Agency:  [place]  Status of Service:  In process, will continue to follow  Medicare Important Message Given:    Date Medicare IM Given:    Medicare IM give by:    Date Additional Medicare IM Given:    Additional Medicare Important Message give by:     If discussed at [place] of Stay Meetings, dates discussed:  02/25/2015  Additional Comments:  Sherald Barge, RN 02/25/2015, 11:40 AM

## 2015-02-25 NOTE — Anesthesia Procedure Notes (Signed)
Procedure Name: MAC Date/Time: 02/25/2015 7:40 AM Performed by: [place] Pre-anesthesia Checklist: Patient identified, Emergency Drugs available, Suction available, Timeout performed and Patient being monitored Patient Re-evaluated:Patient Re-evaluated prior to inductionOxygen Delivery Method: Non-rebreather mask

## 2015-02-25 NOTE — Anesthesia Preprocedure Evaluation (Signed)
Anesthesia Evaluation  Patient identified by MRN, date of birth, ID band Patient confused    Reviewed: Allergy & Precautions, NPO status , Patient's Chart, lab work & pertinent test results  History of Anesthesia Complications (+) PROLONGED EMERGENCE and history of anesthetic complications  Airway Mallampati: I  TM Distance: >3 FB     Dental  (+) Poor Dentition   Pulmonary shortness of breath, sleep apnea , former smoker,    breath sounds clear to auscultation       Cardiovascular + CAD, + Past MI, + CABG, + Peripheral Vascular Disease and +CHF   Rhythm:Regular Rate:Normal  Study Conclusions  - Left ventricle: The cavity size was normal. Wall thickness was normal. Systolic function was normal. The estimated ejection fraction was in the range of 55% to 60%. Wall motion was normal   Neuro/Psych PSYCHIATRIC DISORDERS Anxiety Depression Dimentia TIAnegative psych ROS   GI/Hepatic hiatal hernia, GERD  ,  Endo/Other  diabetes, Type 2Hypothyroidism   Renal/GU CRFRenal disease     Musculoskeletal   Abdominal   Peds  Hematology   Anesthesia Other Findings   Reproductive/Obstetrics                             Anesthesia Physical Anesthesia Plan  ASA: IV  Anesthesia Plan: MAC   Post-op Pain Management:    Induction: Intravenous  Airway Management Planned: Simple Face Mask  Additional Equipment:   Intra-op Plan:   Post-operative Plan:   Informed Consent: I have reviewed the patients History and Physical, chart, labs and discussed the procedure including the risks, benefits and alternatives for the proposed anesthesia with the patient or authorized representative who has indicated his/her understanding and acceptance.     Plan Discussed with:   Anesthesia Plan Comments:         Anesthesia Quick Evaluation

## 2015-02-25 NOTE — Progress Notes (Signed)
Agitated, family reported patient complaining of nausea, Zofran given. Will continue to monitor.

## 2015-02-25 NOTE — Op Note (Signed)
EGD PROCEDURE REPORT  PATIENT:  [name] [name]  MR#:  [id number] Birthdate:  Feb 22, 1934, 81 y.o., male Endoscopist:  Dr. Rogene Houston, MD Procedure Date: 02/25/2015  Procedure:   EGD with ED  Indications:  Patient is 81 year old Caucasian male with multiple medical problems who also had dysphagia felt to be secondary to esophageal motility disorder who has responded to dilation the past. Patient was hospitalized last week with dehydration secondary to gastroenteritis. Underwent barium pill study recently which suggested esophageal motility disorder and revealed narrowing at distal esophagus delaying passage of barium pill into the stomach. EGD was performed 3 days ago by dilation could not be performed because patient was restless and combative. He has underlying dementia.            Informed Consent:  The risks, benefits, alternatives & imponderables which include, but are not limited to, bleeding, infection, perforation, drug reaction and potential missed lesion have been reviewed.  The potential for biopsy, lesion removal, esophageal dilation, etc. have also been discussed.  Questions have been answered.  All parties agreeable.  Please see history & physical in medical record for more information.  Medications:  Monitored anesthesia care. Please see anesthesia records for details.  Description of procedure:  The endoscope was introduced through the mouth and advanced to the second portion of the duodenum without difficulty or limitations. The mucosal surfaces were surveyed very carefully during advancement of the scope and upon withdrawal.  Findings:  Esophagus: Mucosa of the esophagus was normal. GE junction was unremarkable without ring or stricture formation. Prominent contracting is noted in the distal segment. GEJ:  43 cm Hiatus:  45 cm Stomach:  Stomach was empty and distended very well with insufflation. Folds in the proximal stomach were normal. Examination of mucosa at gastric  body, antrum, pyloric channel, angularis fundus and cardia was normal. Duodenum:  Normal bulbar and post bulbar mucosa  Therapeutic/Diagnostic Maneuvers Performed:   Esophagus was dilated by passing 54 French Maloney dilator to full insertion. Some resistance noted distally. Esophagus was then dilated by passing 56 [place] Maloney dilator to full insertion. As the dilator was withdrawn endoscope was passed again and no mucosal disruption noted to esophagus.  Complications:  None  EBL: None  Impression: Small sliding hiatal hernia without evidence of stricture or ring. Esophagus dilated by passing 54 and 56 French Maloney dilators to full insertion but no mucosal disruption noted.  Suspect dysphagia secondary to esophageal motility disorder.  Recommendations:  Mechanical soft diet. If dysphagia persists will consider low-dose nitrate.   REHMAN,NAJEEB U  02/25/2015  8:17 AM  CC: Dr. Hilma Favors, Betsy Coder, MD & Dr. Rayne Du ref. provider found

## 2015-02-25 NOTE — Progress Notes (Signed)
According to patient's daughter-in-law [name] patient was able to drink liquids without difficulty and he took a few bites of solid food. He complained of nausea and was given medication. He is now resting comfortably.

## 2015-02-25 NOTE — Progress Notes (Signed)
Returned to room via stretcher, agitated and picking at foley, restless, moved to bed with assist X 4, LR infusing via PICC in right upper extremity, foley catheter intact with amber cloudy urine in bag.  Telemetry placed, central telemetry notified.

## 2015-02-25 NOTE — Progress Notes (Signed)
Daily Progress Note   Patient Name: [name] [name]       Date: 02/25/2015 DOB: 1934/04/04  Age: 81 y.o. MRN#: [id number] Attending Physician: Mikki Harbor* Primary Care Physician: Purvis Kilts, MD Admit Date: 02/20/2015  Reason for Consultation/Follow-up: Establishing goals of care and Psychosocial/spiritual support  Subjective: Meeting today with DIL, [name] and SO of 63 years Dot Stone.  Mr. Chichester is lying cross ways in the bed.  He has his eyes closed most of the time and is reaching and pulling.  Nursing has just given him his PRN Morphine with minimal response.  [name] and Dot share their concern and grief over Mr. Uncapher's current condition. Dot shares that she feels he would be better off being made comfortable and "let go".  They talk about his state of health before hospitalization and the struggle he had when waking SOB at night. (He now sleeps on a wedge or in a chair.)  We talk about his confusion and unresponsiveness to pain/anxiety meds. [name] tells me that after surgery he had Haldol, but had to be in 4 point restraints for 10 days.   We talk about the esophageal stretching this morning, and that if it was effective he can not eat if he is not awake and appropriate to protect his breathing.  They agree. We talk about Randy's perception of his father's illness.   Call to [facility] who tells me that he is eating lunch and requests to be called back.  Call back to [place], we discussed the procedure this morning, that it was effective but not permanent, he states he is aware of this.  We talk about continued confusion, and not being able to eat.  We talk about feeding tube and that he will be called to make a decision soon.  [name] tells me that he is "kind of leaning away from the tube, I know he said before..."  He goes on to talk about Mr. Turley caring for his aunt and father (both with CHF), and how Mr. Strength has a "fear of drowning, didn't want to die  like that".  We talk about what comfort care would look like, with the use of Morphine for symptom relief.  We talk about in home hospice and the benefits.  I share that it is easier to not start something (regarding feeding tube) than to start it and take it away.  I share that we will support his decisions and suggest that we have a meeting tomorrow.  [name] is unable to commit and I will likely reach him by phone tomorrow.    Length of Stay: 4 days  Current Medications: Scheduled Meds:  . feeding supplement (ENSURE ENLIVE)  237 mL Oral BID BM  . insulin aspart  0-15 Units Subcutaneous TID WC  . levothyroxine  75 mcg Intravenous Daily  . pantoprazole (PROTONIX) IV  40 mg Intravenous QAC breakfast  . sodium chloride  10-40 mL Intracatheter Q12H  . sodium chloride  3 mL Intravenous Q12H    Continuous Infusions: . dextrose 75 mL/hr at 02/25/15 0554    PRN Meds: LORazepam, morphine injection, ondansetron (ZOFRAN) IV, sodium chloride  Palliative Performance Scale: 20% at best      Vital Signs: BP 99/69 mmHg  Pulse 77  Temp(Src) 98.4 F (36.9 C) (Axillary)  Resp 21  Ht 5\' 10"  (1.778 m)  Wt 91.8 kg (202 lb 6.1 oz)  BMI 29.04 kg/m2  SpO2 89% SpO2: SpO2: (!) 89 %  O2 Device: O2 Device: Not Delivered O2 Flow Rate: O2 Flow Rate (L/min): 10 L/min  Intake/output summary:  Intake/Output Summary (Last 24 hours) at 02/25/15 1137 Last data filed at 02/25/15 1009  Gross per 24 hour  Intake 1482.5 ml  Output    850 ml  Net  632.5 ml   LBM:   Baseline Weight: Weight: 97.523 kg (215 lb) Most recent weight: Weight: 91.8 kg (202 lb 6.1 oz)  Physical Exam: Constitutional: elderly, frail, lying in bed agitated.       Resp:  Even and non labored, expiratory wheeze.  Cardio: RRR 70's-120's, no edema.  GI: abd soft, non tender.           Additional Data Reviewed: Recent Labs     02/23/15  0612  02/25/15  0911  NA  151*  148*  BUN  22*  18  CREATININE  1.81*  1.51*     Problem  List:  Patient Active Problem List   Diagnosis Date Noted  . DNR (do not resuscitate) discussion   . Esophageal stricture 02/20/2015  . Acute on chronic combined systolic and diastolic CHF (congestive heart failure) ([place]) 02/18/2015  . Acute exacerbation of CHF (congestive heart failure) ([place]) 01/05/2015  . Acute on chronic combined systolic and diastolic congestive heart failure ([place]) 01/05/2015  . Acute diastolic congestive heart failure ([place])   . Acute CHF ([place]) 01/04/2015  . Bladder cancer ([place]) 12/24/2014  . AKI (acute kidney injury) c Cardiorenal syndrome 10/02/2014  . Postprocedural Bleeding 09/18/2014  . DVT (deep venous thrombosis) ([place])   . Hematuria 09/02/2014  . Pulmonary embolism suspected 08/20/2014  . Congestive heart disease ([place])   . Lower leg DVT (deep venous thrombosis) ([place])   . Protein-calorie malnutrition, severe ([employer] [employer]) 08/18/2014  . CHF (congestive heart failure) ([place]) 08/17/2014  . Acute renal failure superimposed on stage 3 chronic kidney disease ([place]) 08/17/2014  . Acute on chronic systolic and diastolic heart failure, NYHA class 3 ([place]) 08/17/2014  . CKD (chronic kidney disease) stage 3, GFR 30-59 ml/min   . Palliative care encounter   . Obstructive uropathy   . Urinary tract infectious disease   . Blindness   . NSTEMI (non-ST elevated myocardial infarction) ([place])   . Renal insufficiency   . S/p nephrectomy 06/12/2014  . Acute renal failure ([place]) 06/12/2014  . UTI (lower urinary tract infection) 06/12/2014  . Metabolic encephalopathy [id number]  . Elevated troponin 06/12/2014  . Altered mental state 06/12/2014  . Altered mental status   . Transitional cell carcinoma of kidney ([place]) 06/05/2014  . TIA (transient ischemic attack) 01/23/2014  . Dementia without behavioral disturbance 01/23/2014  . Prolonged Q-T interval on ECG 01/23/2014  . DM type 2 (diabetes mellitus, type 2) ([place]) 01/23/2014  . Anemia, post op 01/13/2014  . CAD, multiple vessel  12/18/2013  . S/P CABG x 4 12/18/2013  . ST elevation myocardial infarction (STEMI) of inferior wall, initial episode of care ([place]) 12/17/2013  . Hypoxemia 11/19/2013  . Orthostatic hypotension 11/19/2013  . Dysphagia 12/12/2012  . Dizziness 11/07/2012  . Hyperlipidemia 11/07/2012  . Obstructive sleep apnea 11/07/2012     Palliative Care Assessment & Plan    Code Status:  DNR  Goals of Care:  Unclear goals at this time.   Symptom Management:  Morphine 2 mg IV Q 4 hours PRN.   Zofran 4 mg IV Q ^ hours PRN  Ativan 1 mg IV Q 4 hours PRN.   Palliative Prophylaxis:  Dulcolax  10 mg PR QD PRN  Psycho-social/Spiritual:  Desire for further Chaplaincy support:Not discussed today.    Prognosis: Unable to determine, likely less than 6 months.  Discharge Planning: Undecided at this time.    Care plan was discussed with Nursing staff, CM, SW, and Dr. Jerilee Hoh.   Thank you for allowing the Palliative Medicine Team to assist in the care of this patient.   Time In:  1020 Time Out: 1130 Total Time 70 minutes  Prolonged Time Billed  no     Greater than 50%  of this time was spent counseling and coordinating care related to the above assessment and plan.   Drue Novel, NP  02/25/2015, 11:37 AM  Please contact Palliative Medicine Team phone at [phone] for questions and concerns.

## 2015-02-25 NOTE — Progress Notes (Signed)
PT Cancellation Note  Patient Details Name: [name] MRN: [id number] DOB: 1934/03/31   Cancelled Treatment:    Reason Eval/Treat Not Completed: Patient's level of consciousness.  Pt very agitated and unable to be calmed.  He has been given a sedative and Morphine but he is still agitated enough to be lying sideways in the bed.  Family present.  I told them that I would see him tomorrow if appropriate.   Demetrios Isaacs L  PT 02/25/2015, 11:10 AM [phone]

## 2015-02-26 ENCOUNTER — Encounter (HOSPITAL_COMMUNITY): Payer: Self-pay | Admitting: Internal Medicine

## 2015-02-26 LAB — GLUCOSE, CAPILLARY
GLUCOSE-CAPILLARY: 85 mg/dL (ref 65–99)
GLUCOSE-CAPILLARY: 88 mg/dL (ref 65–99)
Glucose-Capillary: 73 mg/dL (ref 65–99)
Glucose-Capillary: 84 mg/dL (ref 65–99)

## 2015-02-26 LAB — BASIC METABOLIC PANEL
Anion gap: 5 (ref 5–15)
BUN: 20 mg/dL (ref 6–20)
CO2: 30 mmol/L (ref 22–32)
Calcium: 8.8 mg/dL — ABNORMAL LOW (ref 8.9–10.3)
Chloride: 114 mmol/L — ABNORMAL HIGH (ref 101–111)
Creatinine, Ser: 1.67 mg/dL — ABNORMAL HIGH (ref 0.61–1.24)
GFR, EST AFRICAN AMERICAN: 43 mL/min — AB (ref >60)
GFR, EST NON AFRICAN AMERICAN: 37 mL/min — AB (ref >60)
Glucose, Bld: 87 mg/dL (ref 65–99)
POTASSIUM: 3.5 mmol/L (ref 3.5–5.1)
SODIUM: 149 mmol/L — AB (ref 135–145)

## 2015-02-26 MED ORDER — BISACODYL 10 MG RE SUPP
10.0000 mg | Freq: Every day | RECTAL | Status: DC | PRN
  Administered 2015-02-27: 10 mg via RECTAL
  Filled 2015-02-26: qty 1

## 2015-02-26 MED ORDER — ACETAMINOPHEN 650 MG RE SUPP
650.0000 mg | Freq: Four times a day (QID) | RECTAL | Status: DC
  Administered 2015-02-26 – 2015-03-02 (×14): 650 mg via RECTAL
  Filled 2015-02-26 (×16): qty 1

## 2015-02-26 NOTE — Progress Notes (Signed)
PT Cancellation Note  Patient Details Name: [name] [name] MRN: [id number] DOB: [birth year]   Cancelled Treatment:    Reason Eval/Treat Not Completed: Patient's level of consciousness.  Pt continues with severe agitation and disorientation.  Inappropriate for PT eval today.   Demetrios Isaacs L  PT 02/26/2015, 9:54 AM [phone]

## 2015-02-26 NOTE — Progress Notes (Signed)
[facility] PROGRESS NOTE  [name] [name] [id number] DOB: 1933-12-02 DOA: 02/20/2015 PCP: Purvis Kilts, MD  Assessment/Plan: Dysphagia -S/p esophageal dilatation 11/17. -Appreciate palliative care input and recommendations. We will need to continue to talk with son [name] [facility]) to continue to delineate GOC. It appears rest of family members including patient's DIL [name], who is the main caregiver, are all in agreement to hold off on feeding tube.  Acute on Chronic Diastolic CHF -[id number] L negative since admission. -Lasix on hold due to hypernatremia; follow volume status closely. -Can transition to PO lasix once PO status resumed.  Hypernatremia -Na still 149. -Continue D5. -DC lasix -Due to free water deficit given lack of PO intake. -Recheck in am.  Hypokalemia -Repleted -Mg 2.1 11/13.  DM II -Well controlled.  Blindness -Noted.  CAD -Stable. -S/p CABG x 4. -no CP reported.  Hypothyroidism -Continue IV synthroid given inadequate PO intake.  Dementia -At baseline. Appears restless and fidgety in bed.  ARF -Improving.  Code Status: DNR Family Communication: Caregiver and sister at bedside  Disposition Plan: To be determined   Consultants:  None   Antibiotics:  None   Subjective: Restless, fidgeting in bed, mittens on.  Objective: Filed Vitals:   02/25/15 0825 02/25/15 1507 02/25/15 2021 02/26/15 0439  BP:  135/72 124/68 144/86  Pulse: 77 94 79 74  Temp:   98.5 F (36.9 C) 98.5 F (36.9 C)  TempSrc:   Oral Axillary  Resp: 21 20 20 20   Height:      Weight:    91.4 kg (201 lb 8 oz)  SpO2: 89% 95% 94% 93%    Intake/Output Summary (Last 24 hours) at 02/26/15 1559 Last data filed at 02/26/15 0439  Gross per 24 hour  Intake      0 ml  Output    550 ml  Net   -550 ml   Filed Weights   02/24/15 0544 02/25/15 0432 02/26/15 0439  Weight: 98.9 kg (218 lb 0.6 oz) 91.8 kg (202 lb 6.1 oz) 91.4 kg (201 lb 8 oz)     Exam:   General:  Awake, not able to answer questions appropriately.  Cardiovascular: RRR  Respiratory: CTA B  Abdomen: S/NT/ND/+BS  Extremities: trace bilateral edema   Neurologic:  Moves all 4 spontaneously, unable to fully assess given current mental state.  Data Reviewed: Basic Metabolic Panel:  Recent Labs Lab 02/21/15 0557 02/22/15 0522 02/23/15 0612 02/25/15 0911 02/26/15 0629  NA 148* 150* 151* 148* 149*  K 2.7* 3.0* 3.3* 3.6 3.5  CL 106 107 111 112* 114*  CO2 33* 34* 32 29 30  GLUCOSE 100* 101* 126* 100* 87  BUN 36* 29* 22* 18 20  CREATININE 2.14* 2.03* 1.81* 1.51* 1.67*  CALCIUM 8.7* 8.8* 8.9 8.9 8.8*  MG 2.1  --   --   --   --    Liver Function Tests:  Recent Labs Lab 02/20/15 1013 02/21/15 0557  AST 143* 108*  ALT 129* 112*  ALKPHOS 71 59  BILITOT 1.5* 1.2  PROT 6.5 5.7*  ALBUMIN 3.4* 3.0*   No results for input(s): LIPASE, AMYLASE in the last 168 hours. No results for input(s): AMMONIA in the last 168 hours. CBC:  Recent Labs Lab 02/20/15 1013 02/21/15 0557 02/22/15 0522  WBC 8.3 7.6 7.7  NEUTROABS 6.5  --   --   HGB 11.8* 10.4* 10.4*  HCT 40.4 35.3* 36.2*  MCV 79.7 79.7 79.9  PLT 203 191 178  Cardiac Enzymes: No results for input(s): CKTOTAL, CKMB, CKMBINDEX, TROPONINI in the last 168 hours. BNP (last 3 results)  Recent Labs  09/18/14 0130 01/04/15 2140 02/18/15 1245  BNP 2240.0* 2464.0* 3201.1*    ProBNP (last 3 results) No results for input(s): PROBNP in the last 8760 hours.  CBG:  Recent Labs Lab 02/25/15 1137 02/25/15 1611 02/25/15 2020 02/26/15 0750 02/26/15 1125  GLUCAP 90 94 88 85 73    Recent Results (from the past 240 hour(s))  Surgical pcr screen     Status: None   Collection Time: 02/24/15  9:35 PM  Result Value Ref Range Status   MRSA, PCR NEGATIVE NEGATIVE Final   Staphylococcus aureus NEGATIVE NEGATIVE Final    Comment:        The Xpert SA Assay (FDA approved for NASAL specimens in  patients over 5 years of age), is one component of a comprehensive surveillance program.  Test performance has been validated by [facility] for patients greater than or equal to 64 year old. It is not intended to diagnose infection nor to guide or monitor treatment.      Studies: No results found.  Scheduled Meds: . acetaminophen  650 mg Rectal 4 times per day  . feeding supplement (ENSURE ENLIVE)  237 mL Oral BID BM  . insulin aspart  0-15 Units Subcutaneous TID WC  . levothyroxine  75 mcg Intravenous Daily  . pantoprazole (PROTONIX) IV  40 mg Intravenous QAC breakfast  . sodium chloride  10-40 mL Intracatheter Q12H  . sodium chloride  3 mL Intravenous Q12H   Continuous Infusions: . dextrose 1 mL (02/25/15 1436)    Principal Problem:   Dysphagia Active Problems:   Acute on chronic combined systolic and diastolic CHF (congestive heart failure) (HCC)   Hyperlipidemia   S/P CABG x 4   Prolonged Q-T interval on ECG   DM type 2 (diabetes mellitus, type 2) ([place])   Blindness   Esophageal stricture   DNR (do not resuscitate) discussion    Time spent: 25 minutes. Greater than 50% of this time was spent in direct contact with the patient coordinating care.    Lelon Frohlich  [facility] Pager [phone]  If 7PM-7AM, please contact night-coverage at [email or web address], password [facility] 02/26/2015, 3:59 PM  LOS: 5 days

## 2015-02-26 NOTE — Care Management Important Message (Signed)
Important Message  Patient Details  Name: [name] [name] MRN: [id number] Date of Birth: 1933/07/29   Medicare Important Message Given:  Yes    Sherald Barge, RN 02/26/2015, 8:12 AM

## 2015-02-26 NOTE — Progress Notes (Addendum)
Daily Progress Note   Patient Name: [name] [name]       Date: 02/26/2015 DOB: Mar 18, 1934  Age: 81 y.o. MRN#: [id number] Attending Physician: [place] Physician: Purvis Kilts, MD Admit Date: 02/20/2015  Reason for Consultation/Follow-up: Establishing goals of care and Psychosocial/spiritual support  Subjective: [name] [name] is resting better today, but he is still too lethargic to eat. Call to son [name] [name] who states he is still "leaning away from feeding tube", that it is harder to stop than to start, not safe for him, he very restless sleeper. Tosses, turns, fights, even when in better health.  I share my goal is to keep comfortable.  That the IV is keeping him hydrated, and he is not eligible to got to SNF just because of IV.  (No facility will keep him with IV ongoing). We talk about discharge to home with or without feeding tube with [place].   I share that I am really worried about next few days.    States he was told Dr. Laural Golden happy with progression, we talked about GI improvements vs his ability to waken. We also discussed sodium levels.   [name] [name] states he will wax and wane, get better and worse, that he knows it will take a long time to recover.  We talk about what that will that look like.  We don't see a way to make [name] [name]'s health better, and that no one will fault him to stop treatments, or if he decides to put in the tube.  I encourage him to keep And[place] at the center of the discussion.  [name] [name] states, "Don't want to see him suffer, but don't want to loose him".  [name] [name] will meet with family on Sunday. I suggest that if he is still here, we need to meet face to face on Monday.   It seems that [name] [name] is unable to comprehend the seriousness of his father's current state of health, and continues to want to waiting for an event that will either improve function or show a marked decline.  He continues to put off face to face meetings with the medical team.    Length of Stay: 5 days  Current Medications: Scheduled Meds:  . feeding supplement (ENSURE ENLIVE)  237 mL Oral BID BM  . insulin aspart  0-15 Units Subcutaneous TID WC  . levothyroxine  75 mcg Intravenous Daily  . pantoprazole (PROTONIX) IV  40 mg Intravenous QAC breakfast  . sodium chloride  10-40 mL Intracatheter Q12H  . sodium chloride  3 mL Intravenous Q12H    Continuous Infusions: . dextrose 1 mL (02/25/15 1436)    PRN Meds: LORazepam, morphine injection, ondansetron (ZOFRAN) IV, sodium chloride  Palliative Performance Scale: 20%     Vital Signs: BP 144/86 mmHg  Pulse 74  Temp(Src) 98.5 F (36.9 C) (Axillary)  Resp 20  Ht 5\' 10"  (1.778 m)  Wt 91.4 kg (201 lb 8 oz)  BMI 28.91 kg/m2  SpO2 93% SpO2: SpO2: 93 % O2 Device: O2 Device: Not Delivered O2 Flow Rate: O2 Flow Rate (L/min): 10 L/min  Intake/output summary:  Intake/Output Summary (Last 24 hours) at 02/26/15 1455 Last data filed at 02/26/15 0439  Gross per 24 hour  Intake      0 ml  Output    550 ml  Net   -550 ml   LBM:   Baseline Weight: Weight: 97.523 kg (215 lb) Most recent weight: Weight: 91.4 kg (201 lb 8  oz)              Additional Data Reviewed: Recent Labs     02/25/15  0911  02/26/15  0629  NA  148*  149*  BUN  18  20  CREATININE  1.51*  1.67*     Problem List:  Patient Active Problem List   Diagnosis Date Noted  . DNR (do not resuscitate) discussion   . Esophageal stricture 02/20/2015  . Acute on chronic combined systolic and diastolic CHF (congestive heart failure) ([place]) 02/18/2015  . Acute exacerbation of CHF (congestive heart failure) ([place]) 01/05/2015  . Acute on chronic combined systolic and diastolic congestive heart failure ([place]) 01/05/2015  . Acute diastolic congestive heart failure ([place])   . Acute CHF ([place]) 01/04/2015  . Bladder cancer ([place]) 12/24/2014  . AKI (acute kidney injury) c Cardiorenal syndrome 10/02/2014  . Postprocedural Bleeding 09/18/2014  . DVT  (deep venous thrombosis) ([place])   . Hematuria 09/02/2014  . Pulmonary embolism suspected 08/20/2014  . Congestive heart disease ([place])   . Lower leg DVT (deep venous thrombosis) ([place])   . Protein-calorie malnutrition, severe ([place]) 08/18/2014  . CHF (congestive heart failure) ([place]) 08/17/2014  . Acute renal failure superimposed on stage 3 chronic kidney disease ([place]) 08/17/2014  . Acute on chronic systolic and diastolic heart failure, NYHA class 3 ([place]) 08/17/2014  . CKD (chronic kidney disease) stage 3, GFR 30-59 ml/min   . Palliative care encounter   . Obstructive uropathy   . Urinary tract infectious disease   . Blindness   . NSTEMI (non-ST elevated myocardial infarction) ([place])   . Renal insufficiency   . S/p nephrectomy 06/12/2014  . Acute renal failure ([place]) 06/12/2014  . UTI (lower urinary tract infection) 06/12/2014  . Metabolic encephalopathy Q000111Q  . Elevated troponin 06/12/2014  . Altered mental state 06/12/2014  . Altered mental status   . Transitional cell carcinoma of kidney ([place]) 06/05/2014  . TIA (transient ischemic attack) 01/23/2014  . Dementia without behavioral disturbance 01/23/2014  . Prolonged Q-T interval on ECG 01/23/2014  . DM type 2 (diabetes mellitus, type 2) ([place]) 01/23/2014  . Anemia, post op 01/13/2014  . CAD, multiple vessel 12/18/2013  . S/P CABG x 4 12/18/2013  . ST elevation myocardial infarction (STEMI) of inferior wall, initial episode of care ([place]) 12/17/2013  . Hypoxemia 11/19/2013  . Orthostatic hypotension 11/19/2013  . Dysphagia 12/12/2012  . Dizziness 11/07/2012  . Hyperlipidemia 11/07/2012  . Obstructive sleep apnea 11/07/2012     Palliative Care Assessment & Plan    Code Status:  DNR  Goals of Care:  Undecided.   Symptom Management:  Tylenol 650 mg PR Q 6 hours   Morphine 2 mg IV Q 4 hours PRN  Ativan 1 mg IV Q 4 hours PRN  Palliative Prophylaxis:  Dulcolax 10 mg PR PRN.   Psycho-social/Spiritual:  Desire  for further Chaplaincy support:no   Prognosis: Unable to determine, likely less than 6 months  Discharge Planning: Likely home with either home health or Hospice.    Care plan was discussed with nursing staff, CM, SW, and Dr. Jerilee Hoh.   Thank you for allowing the Palliative Medicine Team to assist in the care of this patient.   Time In: 1500 Time Out: 1515 Total Time 20 minutes Prolonged Time Billed  no     Greater than 50%  of this time was spent counseling and coordinating care related to the above assessment and plan.   Drue Novel,  NP  02/26/2015, 2:55 PM  Please contact Palliative Medicine Team phone at [phone] for questions and concerns.

## 2015-02-26 NOTE — Progress Notes (Signed)
Patient is resting/sleeping. According to sister who is at bedside he has been restless most of the day. He did swallow  few sips of nutritional supplement and complained of throat discomfort. Serum sodium is 149 and serum potassium 3.5. Patient is requiring lorazepam frequently to control his agitation. Dementia and hospital psychosis making management difficult. If patient unable to swallow any other option would be placement of gastrostomy tube. Doubt that patient would be able to tolerate nasogastric feeding even for short duration. Dr. Jerilee Hoh and notes by Ms. Dove of palliative care appreciated.   Dr. Gala Romney will be seen patient over the weekend.

## 2015-02-27 LAB — GLUCOSE, CAPILLARY
GLUCOSE-CAPILLARY: 103 mg/dL — AB (ref 65–99)
GLUCOSE-CAPILLARY: 111 mg/dL — AB (ref 65–99)
Glucose-Capillary: 96 mg/dL (ref 65–99)
Glucose-Capillary: 104 mg/dL — ABNORMAL HIGH (ref 65–99)

## 2015-02-27 LAB — BASIC METABOLIC PANEL
Anion gap: 5 (ref 5–15)
BUN: 19 mg/dL (ref 6–20)
CALCIUM: 8.5 mg/dL — AB (ref 8.9–10.3)
CO2: 28 mmol/L (ref 22–32)
CREATININE: 1.57 mg/dL — AB (ref 0.61–1.24)
Chloride: 111 mmol/L (ref 101–111)
GFR calc Af Amer: 47 mL/min — ABNORMAL LOW (ref >60)
GFR, EST NON AFRICAN AMERICAN: 40 mL/min — AB (ref >60)
GLUCOSE: 93 mg/dL (ref 65–99)
Potassium: 3.4 mmol/L — ABNORMAL LOW (ref 3.5–5.1)
SODIUM: 144 mmol/L (ref 135–145)

## 2015-02-27 MED ORDER — MICONAZOLE NITRATE 2 % EX CREA
TOPICAL_CREAM | Freq: Two times a day (BID) | CUTANEOUS | Status: DC
  Filled 2015-02-27: qty 14

## 2015-02-27 NOTE — Progress Notes (Signed)
[facility] PROGRESS NOTE  [name] [name] [id number] DOB: 19-Jun-1933 DOA: 02/20/2015 PCP: Purvis Kilts, MD  Assessment/Plan: Dysphagia -S/p esophageal dilatation 11/17. -Appreciate palliative care input and recommendations. We will need to continue to talk with son [name]) to continue to delineate GOC. It appears rest of family members including patient's DIL [name], who is the main caregiver, are all in agreement to hold off on feeding tube. -Likely dysphagia is more related to behavioral issues and dementia than to a mechanical issue.  Acute on Chronic Diastolic CHF -12 L negative since admission. -Lasix on hold due to hypernatremia; follow volume status closely. -Can transition to PO lasix once PO status resumed.  Hypernatremia -Na down to 144. -Continue D5. -DC lasix -Due to free water deficit given lack of PO intake. -Recheck in am. -Will likely DC IVF in am.  Hypokalemia -Give some IV KCl today. -Mg 2.1 11/13.  DM II -Well controlled.  Blindness -Noted.  CAD -Stable. -S/p CABG x 4. -no CP reported.  Hypothyroidism -Continue IV synthroid given inadequate PO intake.  Dementia -At baseline. Appears restless and fidgety in bed.  ARF -Improving.  Code Status: DNR Family Communication: Son and DIL at bedside updated on plan of care Disposition Plan: To be determined   Consultants:  None   Antibiotics:  None   Subjective: Restless, fidgeting in bed, mittens on.  Objective: Filed Vitals:   02/26/15 0439 02/26/15 2050 02/27/15 0544 02/27/15 1420  BP: 144/86 115/65 112/50 108/63  Pulse: 74 64 68 64  Temp: 98.5 F (36.9 C) 98.8 F (37.1 C) 98.1 F (36.7 C) 97.3 F (36.3 C)  TempSrc: Axillary Axillary Oral Oral  Resp: 20 20 20 20   Height:      Weight: 91.4 kg (201 lb 8 oz)  91.2 kg (201 lb 1 oz)   SpO2: 93% 96% 97% 100%    Intake/Output Summary (Last 24 hours) at 02/27/15 1458 Last data filed at 02/27/15  0700  Gross per 24 hour  Intake 3682.5 ml  Output    625 ml  Net 3057.5 ml   Filed Weights   02/25/15 0432 02/26/15 0439 02/27/15 0544  Weight: 91.8 kg (202 lb 6.1 oz) 91.4 kg (201 lb 8 oz) 91.2 kg (201 lb 1 oz)    Exam:   General:  Awake, not able to answer questions appropriately.  Cardiovascular: RRR  Respiratory: CTA B  Abdomen: S/NT/ND/+BS  Extremities: trace bilateral edema   Neurologic:  Moves all 4 spontaneously, unable to fully assess given current mental state.  Data Reviewed: Basic Metabolic Panel:  Recent Labs Lab 02/21/15 0557 02/22/15 0522 02/23/15 0612 02/25/15 0911 02/26/15 0629 02/27/15 0640  NA 148* 150* 151* 148* 149* 144  K 2.7* 3.0* 3.3* 3.6 3.5 3.4*  CL 106 107 111 112* 114* 111  CO2 33* 34* 32 29 30 28   GLUCOSE 100* 101* 126* 100* 87 93  BUN 36* 29* 22* 18 20 19   CREATININE 2.14* 2.03* 1.81* 1.51* 1.67* 1.57*  CALCIUM 8.7* 8.8* 8.9 8.9 8.8* 8.5*  MG 2.1  --   --   --   --   --    Liver Function Tests:  Recent Labs Lab 02/21/15 0557  AST 108*  ALT 112*  ALKPHOS 59  BILITOT 1.2  PROT 5.7*  ALBUMIN 3.0*   No results for input(s): LIPASE, AMYLASE in the last 168 hours. No results for input(s): AMMONIA in the last 168 hours. CBC:  Recent Labs Lab 02/21/15  0557 02/22/15 0522  WBC 7.6 7.7  HGB 10.4* 10.4*  HCT 35.3* 36.2*  MCV 79.7 79.9  PLT 191 178   Cardiac Enzymes: No results for input(s): CKTOTAL, CKMB, CKMBINDEX, TROPONINI in the last 168 hours. BNP (last 3 results)  Recent Labs  09/18/14 0130 01/04/15 2140 02/18/15 1245  BNP 2240.0* 2464.0* 3201.1*    ProBNP (last 3 results) No results for input(s): PROBNP in the last 8760 hours.  CBG:  Recent Labs Lab 02/26/15 1125 02/26/15 1642 02/26/15 2054 02/27/15 0744 02/27/15 1148  GLUCAP 73 84 88 103* 96    Recent Results (from the past 240 hour(s))  Surgical pcr screen     Status: None   Collection Time: 02/24/15  9:35 PM  Result Value Ref Range  Status   MRSA, PCR NEGATIVE NEGATIVE Final   Staphylococcus aureus NEGATIVE NEGATIVE Final    Comment:        The Xpert SA Assay (FDA approved for NASAL specimens in patients over 57 years of age), is one component of a comprehensive surveillance program.  Test performance has been validated by [facility] for patients greater than or equal to 58 year old. It is not intended to diagnose infection nor to guide or monitor treatment.      Studies: No results found.  Scheduled Meds: . acetaminophen  650 mg Rectal 4 times per day  . feeding supplement (ENSURE ENLIVE)  237 mL Oral BID BM  . insulin aspart  0-15 Units Subcutaneous TID WC  . levothyroxine  75 mcg Intravenous Daily  . miconazole   Topical BID  . pantoprazole (PROTONIX) IV  40 mg Intravenous QAC breakfast  . sodium chloride  10-40 mL Intracatheter Q12H  . sodium chloride  3 mL Intravenous Q12H   Continuous Infusions: . dextrose 75 mL/hr at 02/27/15 1213    Principal Problem:   Dysphagia Active Problems:   Acute on chronic combined systolic and diastolic CHF (congestive heart failure) (HCC)   Hyperlipidemia   S/P CABG x 4   Prolonged Q-T interval on ECG   DM type 2 (diabetes mellitus, type 2) ([place])   Blindness   Esophageal stricture   DNR (do not resuscitate) discussion    Time spent: 15 minutes. Greater than 50% of this time was spent in direct contact with the patient coordinating care.    Lelon Frohlich  [facility] Pager [phone]  If 7PM-7AM, please contact night-coverage at [email or web address], password [facility] 02/27/2015, 2:58 PM  LOS: 6 days

## 2015-02-27 NOTE — Progress Notes (Signed)
Patient is awake and alert but confused. He is calm. He voices no complaints. Discussed with nursing staff. Has eaten only a small amount of what was provided on his breakfast and lunch tray today Discussed with Dr. Laural Golden last evening.  Vital signs in last 24 hours: Temp:  [98.1 F (36.7 C)-98.8 F (37.1 C)] 98.1 F (36.7 C) (11/19 0544) Pulse Rate:  [64-68] 68 (11/19 0544) Resp:  [20] 20 (11/19 0544) BP: (112-115)/(50-65) 112/50 mmHg (11/19 0544) SpO2:  [96 %-97 %] 97 % (11/19 0544) Weight:  [201 lb 1 oz (91.2 kg)] 201 lb 1 oz (91.2 kg) (11/19 0544) Last BM Date: 02/21/15 General:   Alert,  pleasant and cooperative in NAD. He is confused, however. No family members in attendance today at the time of my visit Abdomen:   nondistended.  Normal bowel sounds, without guarding, and without rebound.  No mass or organomegaly. Extremities:  Without clubbing or edema.    Intake/Output from previous day: 11/18 0701 - 11/19 0700 In: 3682.5 [I.V.:3682.5] Out: 625 [Urine:625] Intake/Output this shift:    Lab Results: No results for input(s): WBC, HGB, HCT, PLT in the last 72 hours. BMET  Recent Labs  02/25/15 0911 02/26/15 0629 02/27/15 0640  NA 148* 149* 144  K 3.6 3.5 3.4*  CL 112* 114* 111  CO2 29 30 28   GLUCOSE 100* 87 93  BUN 18 20 19   CREATININE 1.51* 1.67* 1.57*  CALCIUM 8.9 8.8* 8.5*   LFT No results for input(s): PROT, ALBUMIN, AST, ALT, ALKPHOS, BILITOT, BILIDIR, IBILI in the last 72 hours. PT/INR No results for input(s): LABPROT, INR in the last 72 hours. Hepatitis Panel No results for input(s): HEPBSAG, HCVAB, HEPAIGM, HEPBIGM in the last 72 hours. C-Diff No results for input(s): CDIFFTOX in the last 72 hours.  Studies/Results: No results found.  Assessment: Principal Problem:   Dysphagia Active Problems:   Hyperlipidemia   S/P CABG x 4   Prolonged Q-T interval on ECG   DM type 2 (diabetes mellitus, type 2) (HCC)   Blindness   Acute on chronic combined  systolic and diastolic CHF (congestive heart failure) ([place])   Esophageal stricture   DNR (do not resuscitate) discussion   Impression:  Apparently mental status much improved today as compared to previously. Likely has an esophageal motility disorder. However, diminished oral intake may be more behavioral than physiologic/mechanical in origin.  Recommendations:  Observe oral intake over the remainder of the weekend. Calorie count. Reassess the first of the week.

## 2015-02-27 NOTE — Progress Notes (Signed)
Pt noted to have reddened rash to buttock resembling yeast.  Pt on call provider notified.  Miconazole cream prescribed.

## 2015-02-28 DIAGNOSIS — E1122 Type 2 diabetes mellitus with diabetic chronic kidney disease: Secondary | ICD-10-CM

## 2015-02-28 LAB — GLUCOSE, CAPILLARY
Glucose-Capillary: 113 mg/dL — ABNORMAL HIGH (ref 65–99)
Glucose-Capillary: 142 mg/dL — ABNORMAL HIGH (ref 65–99)
Glucose-Capillary: 111 mg/dL — ABNORMAL HIGH (ref 65–99)

## 2015-02-28 MED ORDER — ONDANSETRON HCL 4 MG/2ML IJ SOLN
4.0000 mg | INTRAMUSCULAR | Status: DC | PRN
  Administered 2015-02-28 – 2015-03-01 (×2): 4 mg via INTRAVENOUS
  Filled 2015-02-28 (×2): qty 2

## 2015-02-28 MED ORDER — CLOTRIMAZOLE 1 % EX CREA
TOPICAL_CREAM | Freq: Two times a day (BID) | CUTANEOUS | Status: DC
  Administered 2015-02-28 – 2015-03-02 (×5): via TOPICAL
  Filled 2015-02-28: qty 15

## 2015-02-28 NOTE — Progress Notes (Signed)
[facility] PROGRESS NOTE  [name] [name] [id number] DOB: May 22, 1933 DOA: 02/20/2015 PCP: Purvis Kilts, MD  Assessment/Plan: Dysphagia -S/p esophageal dilatation 11/17. -Appreciate palliative care input and recommendations. We will need to continue to talk with son [name] [id number]) to continue to delineate GOC. Most of family members are leaning towards hospice and no artificial feeding. -Likely dysphagia is more related to behavioral issues and dementia than to a mechanical issue.  Acute on Chronic Diastolic CHF -[id number] L negative since admission. -Lasix on hold due to hypernatremia; follow volume status closely. Appears clinically dry at present. -Can transition to PO lasix once PO status resumed.  Hypernatremia -Na down to 144. -Will DC IVF today. -DC lasix -Due to free water deficit given lack of PO intake. -Recheck in am.  Hypokalemia -Recheck K in am. -Mg 2.1 11/13.  DM II -Well controlled. -Continue current management.  Blindness -Noted.  CAD -Stable. -S/p CABG x 4. -no CP reported.  Hypothyroidism -Continue IV synthroid given inadequate PO intake.  Dementia -At baseline. Appears restless and fidgety in bed.  ARF -Improving.  Code Status: DNR Family Communication: Son and DIL at bedside updated on plan of care Disposition Plan: To be determined   Consultants:  None   Antibiotics:  None   Subjective: Restless, fidgeting in bed, mittens on.  Objective: Filed Vitals:   02/27/15 0544 02/27/15 1420 02/27/15 2052 02/28/15 0549  BP: 112/50 108/63 92/50 97/79   Pulse: 68 64 66 64  Temp: 98.1 F (36.7 C) 97.3 F (36.3 C) 98 F (36.7 C) 97.4 F (36.3 C)  TempSrc: Oral Oral Oral Oral  Resp: 20 20 20 20   Height:      Weight: 91.2 kg (201 lb 1 oz)   94.2 kg (207 lb 10.8 oz)  SpO2: 97% 100% 96% 98%    Intake/Output Summary (Last 24 hours) at 02/28/15 1111 Last data filed at 02/28/15 [id number]  Gross per 24 hour  Intake 1886.25  ml  Output    450 ml  Net 1436.25 ml   Filed Weights   02/26/15 0439 02/27/15 0544 02/28/15 0549  Weight: 91.4 kg (201 lb 8 oz) 91.2 kg (201 lb 1 oz) 94.2 kg (207 lb 10.8 oz)    Exam:   General:  Awake, not able to answer questions appropriately.  Cardiovascular: RRR  Respiratory: CTA B  Abdomen: S/NT/ND/+BS  Extremities: trace bilateral edema   Neurologic:  Moves all 4 spontaneously, unable to fully assess given current mental state.  Data Reviewed: Basic Metabolic Panel:  Recent Labs Lab 02/22/15 0522 02/23/15 0612 02/25/15 0911 02/26/15 0629 02/27/15 0640  NA 150* 151* 148* 149* 144  K 3.0* 3.3* 3.6 3.5 3.4*  CL 107 111 112* 114* 111  CO2 34* 32 29 30 28   GLUCOSE 101* 126* 100* 87 93  BUN 29* 22* 18 20 19   CREATININE 2.03* 1.81* 1.51* 1.67* 1.57*  CALCIUM 8.8* 8.9 8.9 8.8* 8.5*   Liver Function Tests: No results for input(s): AST, ALT, ALKPHOS, BILITOT, PROT, ALBUMIN in the last 168 hours. No results for input(s): LIPASE, AMYLASE in the last 168 hours. No results for input(s): AMMONIA in the last 168 hours. CBC:  Recent Labs Lab 02/22/15 0522  WBC 7.7  HGB 10.4*  HCT 36.2*  MCV 79.9  PLT 178   Cardiac Enzymes: No results for input(s): CKTOTAL, CKMB, CKMBINDEX, TROPONINI in the last 168 hours. BNP (last 3 results)  Recent Labs  09/18/14 0130 01/04/15 2140 02/18/15 1245  BNP  2240.0* 2464.0* 3201.1*    ProBNP (last 3 results) No results for input(s): PROBNP in the last 8760 hours.  CBG:  Recent Labs Lab 02/27/15 0744 02/27/15 1148 02/27/15 1619 02/27/15 2054 02/28/15 0745  GLUCAP 103* 96 111* 104* 113*    Recent Results (from the past 240 hour(s))  Surgical pcr screen     Status: None   Collection Time: 02/24/15  9:35 PM  Result Value Ref Range Status   MRSA, PCR NEGATIVE NEGATIVE Final   Staphylococcus aureus NEGATIVE NEGATIVE Final    Comment:        The Xpert SA Assay (FDA approved for NASAL specimens in patients over 71  years of age), is one component of a comprehensive surveillance program.  Test performance has been validated by [facility] for patients greater than or equal to 1 year old. It is not intended to diagnose infection nor to guide or monitor treatment.      Studies: No results found.  Scheduled Meds: . acetaminophen  650 mg Rectal 4 times per day  . feeding supplement (ENSURE ENLIVE)  237 mL Oral BID BM  . insulin aspart  0-15 Units Subcutaneous TID WC  . levothyroxine  75 mcg Intravenous Daily  . miconazole   Topical BID  . pantoprazole (PROTONIX) IV  40 mg Intravenous QAC breakfast  . sodium chloride  10-40 mL Intracatheter Q12H  . sodium chloride  3 mL Intravenous Q12H   Continuous Infusions:    Principal Problem:   Dysphagia Active Problems:   Acute on chronic combined systolic and diastolic CHF (congestive heart failure) (HCC)   Hyperlipidemia   S/P CABG x 4   Prolonged Q-T interval on ECG   DM type 2 (diabetes mellitus, type 2) ([place])   Blindness   Esophageal stricture   DNR (do not resuscitate) discussion    Time spent: 15 minutes. Greater than 50% of this time was spent in direct contact with the patient coordinating care.    Lelon Frohlich  [facility] Pager [phone]  If 7PM-7AM, please contact night-coverage at [email or web address], password [facility] 02/28/2015, 11:11 AM  LOS: 7 days

## 2015-03-01 ENCOUNTER — Encounter (HOSPITAL_COMMUNITY): Payer: Self-pay

## 2015-03-01 ENCOUNTER — Ambulatory Visit (HOSPITAL_COMMUNITY): Payer: Medicare Other

## 2015-03-01 ENCOUNTER — Other Ambulatory Visit (HOSPITAL_COMMUNITY): Payer: Self-pay | Admitting: Vascular Surgery

## 2015-03-01 DIAGNOSIS — I509 Heart failure, unspecified: Secondary | ICD-10-CM

## 2015-03-01 LAB — BASIC METABOLIC PANEL
Anion gap: 7 (ref 5–15)
BUN: 19 mg/dL (ref 6–20)
CALCIUM: 8.8 mg/dL — AB (ref 8.9–10.3)
CO2: 26 mmol/L (ref 22–32)
Chloride: 105 mmol/L (ref 101–111)
Creatinine, Ser: 1.61 mg/dL — ABNORMAL HIGH (ref 0.61–1.24)
GFR calc Af Amer: 45 mL/min — ABNORMAL LOW (ref >60)
GFR, EST NON AFRICAN AMERICAN: 39 mL/min — AB (ref >60)
GLUCOSE: 89 mg/dL (ref 65–99)
Potassium: 3.5 mmol/L (ref 3.5–5.1)
Sodium: 138 mmol/L (ref 135–145)

## 2015-03-01 LAB — GLUCOSE, CAPILLARY
GLUCOSE-CAPILLARY: 87 mg/dL (ref 65–99)
Glucose-Capillary: 94 mg/dL (ref 65–99)
Glucose-Capillary: 85 mg/dL (ref 65–99)
Glucose-Capillary: 85 mg/dL (ref 65–99)

## 2015-03-01 MED ORDER — HALOPERIDOL LACTATE 5 MG/ML IJ SOLN
1.0000 mg | Freq: Once | INTRAMUSCULAR | Status: AC
  Administered 2015-03-01: 1 mg via INTRAVENOUS
  Filled 2015-03-01: qty 1

## 2015-03-01 NOTE — Progress Notes (Signed)
PT Cancellation Note  Patient Details Name: KERRIGAN VANSKIVER MRN: [id number] DOB: 08-08-1933   Cancelled Treatment:     Pt continues to be inappropriate for PT.  Family member states that he will be going home with Hospice.  I indicated that we would not make any further attempts at PT and she was very agreeable to this.   Demetrios Isaacs L  PT 03/01/2015, 12:40 PM [phone]

## 2015-03-01 NOTE — Progress Notes (Signed)
Pt agitated and thrashing around in the bed. Ativan given earlier in the day and appeared to make pt more confused and agitated. MD paged and order received for one time dose of Haldol.

## 2015-03-01 NOTE — Progress Notes (Signed)
Daily Progress Note   Patient Name: [name] [name]       Date: 03/01/2015 DOB: 1933/10/22  Age: 81 y.o. MRN#: [id number] Attending Physician: [place] Physician: Purvis Kilts, MD Admit Date: 02/20/2015  Reason for Consultation/Follow-up: Establishing goals of care and Psychosocial/spiritual support  Subjective: Mr. [name] [name] seems more awake today, but still very restless.  He is able to eat more, but states he is "full" and "sick".  DIL [name] [name] at bedside. She states she feels that 24/7 caregivers can manage Mr. [name] [name] in his home in this state.   Call to [facility] who tells me that he DOES NOT want a feeding tube at this time, and that he is ok with going home with Hospice.  We talk about services and fees.  He request choice for hospice be discussed with [name] [name].  No other questions today.    Length of Stay: 8 days  Current Medications: Scheduled Meds:  . acetaminophen  650 mg Rectal 4 times per day  . clotrimazole   Topical BID  . feeding supplement (ENSURE ENLIVE)  237 mL Oral BID BM  . insulin aspart  0-15 Units Subcutaneous TID WC  . levothyroxine  75 mcg Intravenous Daily  . pantoprazole (PROTONIX) IV  40 mg Intravenous QAC breakfast  . sodium chloride  10-40 mL Intracatheter Q12H  . sodium chloride  3 mL Intravenous Q12H    Continuous Infusions:    PRN Meds: bisacodyl, LORazepam, morphine injection, ondansetron (ZOFRAN) IV, sodium chloride  Palliative Performance Scale: 30%     Vital Signs: BP 110/63 mmHg  Pulse 68  Temp(Src) 97.6 F (36.4 C) (Oral)  Resp 20  Ht 5\' 10"  (1.778 m)  Wt 93.7 kg (206 lb 9.1 oz)  BMI 29.64 kg/m2  SpO2 97% SpO2: SpO2: 97 % O2 Device: O2 Device: Not Delivered O2 Flow Rate: O2 Flow Rate (L/min): 10 L/min  Intake/output summary:  Intake/Output Summary (Last 24 hours) at 03/01/15 1315 Last data filed at 03/01/15 0955  Gross per 24 hour  Intake     63 ml  Output    500 ml  Net   -437 ml     LBM:   Baseline Weight: Weight: 97.523 kg (215 lb) Most recent weight: Weight: 93.7 kg (206 lb 9.1 oz)               Additional Data Reviewed: Recent Labs     02/27/15  0640  03/01/15  0622  NA  144  138  BUN  19  19  CREATININE  1.57*  1.61*     Problem List:  Patient Active Problem List   Diagnosis Date Noted  . DNR (do not resuscitate) discussion   . Esophageal stricture 02/20/2015  . Acute on chronic combined systolic and diastolic CHF (congestive heart failure) ([place]) 02/18/2015  . Acute exacerbation of CHF (congestive heart failure) ([place]) 01/05/2015  . Acute on chronic combined systolic and diastolic congestive heart failure ([place]) 01/05/2015  . Acute diastolic congestive heart failure ([place])   . Acute CHF ([place]) 01/04/2015  . Bladder cancer ([place]) 12/24/2014  . AKI (acute kidney injury) c Cardiorenal syndrome 10/02/2014  . Postprocedural Bleeding 09/18/2014  . DVT (deep venous thrombosis) ([place])   . Hematuria 09/02/2014  . Pulmonary embolism suspected 08/20/2014  . Congestive heart disease ([place])   . Lower leg DVT (deep venous thrombosis) ([place])   . Protein-calorie malnutrition, severe ([place]) 08/18/2014  . CHF (congestive heart failure) ([place])  08/17/2014  . Acute renal failure superimposed on stage 3 chronic kidney disease ([place]) 08/17/2014  . Acute on chronic systolic and diastolic heart failure, NYHA class 3 ([place]) 08/17/2014  . CKD (chronic kidney disease) stage 3, GFR 30-59 ml/min   . Palliative care encounter   . Obstructive uropathy   . Urinary tract infectious disease   . Blindness   . NSTEMI (non-ST elevated myocardial infarction) ([place])   . Renal insufficiency   . S/p nephrectomy 06/12/2014  . Acute renal failure ([place]) 06/12/2014  . UTI (lower urinary tract infection) 06/12/2014  . Metabolic encephalopathy [id number]  . Elevated troponin 06/12/2014  . Altered mental state 06/12/2014  . Altered mental status   . Transitional cell carcinoma of kidney ([place])  06/05/2014  . TIA (transient ischemic attack) 01/23/2014  . Dementia without behavioral disturbance 01/23/2014  . Prolonged Q-T interval on ECG 01/23/2014  . DM type 2 (diabetes mellitus, type 2) ([place]) 01/23/2014  . Anemia, post op 01/13/2014  . CAD, multiple vessel 12/18/2013  . S/P CABG x 4 12/18/2013  . ST elevation myocardial infarction (STEMI) of inferior wall, initial episode of care ([place]) 12/17/2013  . Hypoxemia 11/19/2013  . Orthostatic hypotension 11/19/2013  . Dysphagia 12/12/2012  . Dizziness 11/07/2012  . Hyperlipidemia 11/07/2012  . Obstructive sleep apnea 11/07/2012     Palliative Care Assessment & Plan    Code Status:  DNR   NO FEEDING TUBE  Goal is to pass at his own home when time comes.   Goals of Care:  Home with Hospice  Symptom Management:  Tylenol 650 mg PR Q 6 hours   Morphine 2 mg IV Q 4 hours PRN  Ativan 1 mg IV Q 4 hours PRN  Palliative Prophylaxis:  Dulcolax 10 mg PR PRN.  Psycho-social/Spiritual:  Desire for further Chaplaincy support:Not today.    Prognosis: Unable to determine, less than 6 months likely.  Discharge Planning: Home with Hospice, provider to be determined.    Care plan was discussed with nursing staff, CM, SW, and Dr. Jerilee Hoh.   Thank you for allowing the Palliative Medicine Team to assist in the care of this patient.   Time In: 1230 Time Out: 1300 Total Time 30 minutes Prolonged Time Billed  no     Greater than 50%  of this time was spent counseling and coordinating care related to the above assessment and plan.   Drue Novel, NP  03/01/2015, 1:15 PM  Please contact Palliative Medicine Team phone at [phone] for questions and concerns.

## 2015-03-01 NOTE — Progress Notes (Signed)
Pt refused CBG testing. Pt became very combative and agitated.

## 2015-03-01 NOTE — Progress Notes (Signed)
Calorie Count Note  48 hour calorie count ordered.  Diet: Dysphagia I with thin liquids Supplements: Ensure Enlive BID  Breakfast: today 3 bites grits Lunch: Today-2 bites sweet potatoes Dinner: pending Supplements: 1 Ensure yesterday. Staff reports it took all day to get him to drink one (350 kcal, 20 gr protein)  Total intake: Meal tickets are not completed. Only able to estimate intake based on conversation with family and staff. (est <100 kcal/ meal). Taking only sips of fluid. Talked with nursing and daughter. Daughter-in law says he has not eaten more than 3 bites of any meal over the weekend. He takes a few bites and then refuses and says it will make him sick. Patients current oral intake is not sufficient to meet his estimated needs and concerned he will quickly dehydrate if/when IV fluids are discontinued.  Nutrition Dx: Inadequate oral intake related to refusing foods and taking sips only of liquids AEB his daughter-in-law who has been at bedside and assisting with meals.  Goal: [place] pt desires for intervention regarding his nutrition/ healthcare.  Intervention: Palliative consult and GI team are following. Pt is s/p  Esophagus dialation 11/17.

## 2015-03-01 NOTE — Progress Notes (Signed)
  Subjective: According to patient's sitter he has been confused and restless today. She is not sure if he ate breakfast or lunch because she was not at bedside. She states he ate 100% of his breakfast yesterday and most of his lunch without any difficulty.  Objective: Blood pressure 101/75, pulse 77, temperature 97.6 F (36.4 C), temperature source Oral, resp. rate 20, height 5\' 10"  (1.778 m), weight 206 lb 9.1 oz (93.7 kg), SpO2 97 %. Patient is alert and confused.  Labs/studies Results:   Recent Labs  02/27/15 0640 03/01/15 0622  NA 144 138  K 3.4* 3.5  CL 111 105  CO2 28 26  GLUCOSE 93 89  BUN 19 19  CREATININE 1.57* 1.61*  CALCIUM 8.5* 8.8*       Assessment:  #1. Dysphagia. Patient has esophageal motility disorder. His esophagus was dilated three days ago. It appears he is swallowing better intermittently particularly when he is not confused. Calorie count is underway. Family and POA have decided not to proceed with gastric feeding. It is quite possible that he may be able to eat when he is not confused. #2. Mental status changes. He has underlying dementia and I believe he has developed hospital psychosis. #3. History of CHF. He does not appear to be in failure at the present time.   Recommendations: Await results of calorie intake.

## 2015-03-01 NOTE — Progress Notes (Signed)
[facility] PROGRESS NOTE  [name] [name] [id number] DOB: 01-10-1934 DOA: 02/20/2015 PCP: Purvis Kilts, MD  Assessment/Plan: Dysphagia -S/p esophageal dilatation 11/17. -Appreciate palliative care input and recommendations.  -Apparently family is now willing to do home hospice. -Likely dysphagia is more related to behavioral issues and dementia than to a mechanical issue.  Dementia -With significant behavioral disturbances. -Plan home with hospice over next 24 hours.  Acute on Chronic Diastolic CHF -11 L negative since admission. -Lasix on hold due to hypernatremia; follow volume status closely. Appears clinically dry at present. -Can transition to PO lasix once PO status resumed.  Hypernatremia -Na down to 138. -IVF discontinued 11/20. -Off lasix  Hypokalemia -Repleted. -Mg 2.1 11/13.  DM II -Well controlled. -Continue current management.  Blindness -Noted.  CAD -Stable. -S/p CABG x 4. -no CP reported.  Hypothyroidism -Continue IV synthroid given inadequate PO intake.  Dementia -At baseline. Appears restless and fidgety in bed.  ARF -Slight worsening. Due to prerenal from decreased PO intake.  Code Status: DNR Family Communication:  DIL, [name], at bedside updated on plan of care Disposition Plan: Home with hospice next 24 hours   Consultants:  None   Antibiotics:  None   Subjective: Restless, fidgeting in bed, mittens on.  Objective: Filed Vitals:   02/28/15 0549 02/28/15 1652 02/28/15 2228 03/01/15 0500  BP: 97/79 112/67 110/63   Pulse: 64 67 68   Temp: 97.4 F (36.3 C) 97.9 F (36.6 C) 97.6 F (36.4 C)   TempSrc: Oral Oral Oral   Resp: 20 20 20    Height:      Weight: 94.2 kg (207 lb 10.8 oz)   93.7 kg (206 lb 9.1 oz)  SpO2: 98% 98% 97%     Intake/Output Summary (Last 24 hours) at 03/01/15 1429 Last data filed at 03/01/15 0955  Gross per 24 hour  Intake     63 ml  Output    500 ml  Net   -437 ml    Filed Weights   02/27/15 0544 02/28/15 0549 03/01/15 0500  Weight: 91.2 kg (201 lb 1 oz) 94.2 kg (207 lb 10.8 oz) 93.7 kg (206 lb 9.1 oz)    Exam:   General:  Awake, not able to answer questions appropriately.  Cardiovascular: RRR  Respiratory: CTA B  Abdomen: S/NT/ND/+BS  Extremities: trace bilateral edema   Neurologic:  Moves all 4 spontaneously, unable to fully assess given current mental state.  Data Reviewed: Basic Metabolic Panel:  Recent Labs Lab 02/23/15 0612 02/25/15 0911 02/26/15 0629 02/27/15 0640 03/01/15 0622  NA 151* 148* 149* 144 138  K 3.3* 3.6 3.5 3.4* 3.5  CL 111 112* 114* 111 105  CO2 32 29 30 28 26   GLUCOSE 126* 100* 87 93 89  BUN 22* 18 20 19 19   CREATININE 1.81* 1.51* 1.67* 1.57* 1.61*  CALCIUM 8.9 8.9 8.8* 8.5* 8.8*   Liver Function Tests: No results for input(s): AST, ALT, ALKPHOS, BILITOT, PROT, ALBUMIN in the last 168 hours. No results for input(s): LIPASE, AMYLASE in the last 168 hours. No results for input(s): AMMONIA in the last 168 hours. CBC: No results for input(s): WBC, NEUTROABS, HGB, HCT, MCV, PLT in the last 168 hours. Cardiac Enzymes: No results for input(s): CKTOTAL, CKMB, CKMBINDEX, TROPONINI in the last 168 hours. BNP (last 3 results)  Recent Labs  09/18/14 0130 01/04/15 2140 02/18/15 1245  BNP 2240.0* 2464.0* 3201.1*    ProBNP (last 3 results) No results for input(s): PROBNP  in the last 8760 hours.  CBG:  Recent Labs Lab 02/28/15 1131 02/28/15 1650 02/28/15 2226 03/01/15 0750 03/01/15 1129  GLUCAP 142* 111* 87 94 85    Recent Results (from the past 240 hour(s))  Surgical pcr screen     Status: None   Collection Time: 02/24/15  9:35 PM  Result Value Ref Range Status   MRSA, PCR NEGATIVE NEGATIVE Final   Staphylococcus aureus NEGATIVE NEGATIVE Final    Comment:        The Xpert SA Assay (FDA approved for NASAL specimens in patients over 60 years of age), is one component of a comprehensive  surveillance program.  Test performance has been validated by [facility] for patients greater than or equal to 81 year old. It is not intended to diagnose infection nor to guide or monitor treatment.      Studies: No results found.  Scheduled Meds: . acetaminophen  650 mg Rectal 4 times per day  . clotrimazole   Topical BID  . feeding supplement (ENSURE ENLIVE)  237 mL Oral BID BM  . insulin aspart  0-15 Units Subcutaneous TID WC  . levothyroxine  75 mcg Intravenous Daily  . pantoprazole (PROTONIX) IV  40 mg Intravenous QAC breakfast  . sodium chloride  10-40 mL Intracatheter Q12H  . sodium chloride  3 mL Intravenous Q12H   Continuous Infusions:    Principal Problem:   Dysphagia Active Problems:   Acute on chronic combined systolic and diastolic CHF (congestive heart failure) (HCC)   Hyperlipidemia   S/P CABG x 4   Prolonged Q-T interval on ECG   DM type 2 (diabetes mellitus, type 2) ([place])   Blindness   Esophageal stricture   DNR (do not resuscitate) discussion   Heart failure, unspecified ([place])    Time spent: 25 minutes. Greater than 50% of this time was spent in direct contact with the patient coordinating care.    Lelon Frohlich  [facility] Pager [phone]  If 7PM-7AM, please contact night-coverage at [email or web address], password [facility] 03/01/2015, 2:29 PM  LOS: 8 days

## 2015-03-02 LAB — GLUCOSE, CAPILLARY
Glucose-Capillary: 65 mg/dL (ref 65–99)
Glucose-Capillary: 75 mg/dL (ref 65–99)
Glucose-Capillary: 65 mg/dL (ref 65–99)

## 2015-03-02 MED ORDER — LORAZEPAM 1 MG PO TABS: 1.0000 mg | ORAL_TABLET | ORAL | Status: AC | PRN

## 2015-03-02 MED ORDER — BISACODYL 10 MG RE SUPP
10.0000 mg | Freq: Every day | RECTAL | Status: AC | PRN
Start: 2015-03-02 — End: ?

## 2015-03-02 MED ORDER — CLOTRIMAZOLE 1 % EX CREA: TOPICAL_CREAM | Freq: Two times a day (BID) | CUTANEOUS | Status: AC

## 2015-03-02 MED ORDER — ACETAMINOPHEN 650 MG RE SUPP: 650.0000 mg | Freq: Four times a day (QID) | RECTAL | Status: AC

## 2015-03-02 MED ORDER — MORPHINE SULFATE (CONCENTRATE) 10 MG /0.5 ML PO SOLN: 10.0000 mg | ORAL | Status: AC | PRN

## 2015-03-02 NOTE — Discharge Summary (Addendum)
Physician Discharge Summary  DYLANGER FUENTEZ [id number] DOB: 08/08/1933 DOA: 02/20/2015  PCP: Purvis Kilts, MD  Admit date: 02/20/2015 Discharge date: 03/02/2015  Time spent: 45 minutes  Recommendations for Outpatient Follow-up:  -Will be discharged home today with home hospice services per family's decision.   Discharge Diagnoses:  Principal Problem:   Dysphagia Active Problems:   Acute on chronic combined systolic and diastolic CHF (congestive heart failure) (HCC)   Hyperlipidemia   S/P CABG x 4   Prolonged Q-T interval on ECG   DM type 2 (diabetes mellitus, type 2) ([place])   Blindness   Esophageal stricture   DNR (do not resuscitate) discussion   Heart failure, unspecified ([employer])   Discharge Condition: Stable and improved  Filed Weights   02/28/15 0549 03/01/15 0500 03/02/15 0720  Weight: 94.2 kg (207 lb 10.8 oz) 93.7 kg (206 lb 9.1 oz) 93 kg (205 lb 0.4 oz)    History of present illness:  As per Dr. Wyline Copas 11/12: [name] [name] is a 81 y.o. male with a hx of esophageal stricture requiring dilation, diastolic CHF, CAD s/p CABGx4, HLD, DM2 who presents to the ED with nausea and inability to tolerate PO intake. Pt was seen by Cardiology on 11/10 for same issues. Abd [place] was ordered to evaluate for GI cause of symptoms. Pt was noted to be markedly volume overloaded with ascites present. Patient was advised to increase diuretics to torsemide 40mg  bid x 3days then 40mg  daily. Pt's symptoms worsened and he was unable to tolerate PO. Pt subsequently presented to the ED. GI was called with recommendations for inpatient admission for stabilization of pt. Patient was given one dose of IV lasix. Hospitalist consulted for admission.   Hospital Course:   Dysphagia -S/p esophageal dilatation 11/17. -Appreciate palliative care input and recommendations.  -Likely dysphagia is more related to behavioral issues and dementia than to a mechanical issue. Possibly an esophageal  dysmotility disorder. -Patient will be discharged home today with home hospice services. -Anticipate quick decline.  Dementia -With significant behavioral disturbances. -Plan home with hospice over next 24 hours.  Acute on Chronic Diastolic CHF -99991111 L negative since admission.  Hypernatremia -Due to inability to take sufficient PO free water given dementia.  Hypokalemia -Repleted. -Mg 2.1 11/13.  DM II -Well controlled. -Continue current management.  Blindness -Noted.  CAD -Stable. -S/p CABG x 4. -no CP reported.  Hypothyroidism -Continue synthroid if able to take PO at home.  Dementia -At baseline. Appears restless and fidgety in bed.  ARF -Due to prerenal from decreased PO intake. -Cr has remained between 1.6-1.8 this hospitalization.   Procedures:  None   Consultations:  Renal  Palliative Care  Discharge Instructions      Discharge Instructions    Increase activity slowly    Complete by:  As directed             Medication List    STOP taking these medications        aspirin 81 MG chewable tablet     donepezil 10 MG tablet  Commonly known as:  ARICEPT     escitalopram 20 MG tablet  Commonly known as:  LEXAPRO     levothyroxine 125 MCG tablet  Commonly known as:  SYNTHROID, LEVOTHROID     midodrine 10 MG tablet  Commonly known as:  PROAMATINE     mirabegron ER 50 MG Tb24 tablet  Commonly known as:  MYRBETRIQ     pantoprazole 40 MG  tablet  Commonly known as:  PROTONIX     polyethylene glycol packet  Commonly known as:  MIRALAX / GLYCOLAX     potassium chloride SA 20 MEQ tablet  Commonly known as:  K-DUR,KLOR-CON     temazepam 15 MG capsule  Commonly known as:  RESTORIL     torsemide 20 MG tablet  Commonly known as:  DEMADEX     ziprasidone 20 MG capsule  Commonly known as:  GEODON      TAKE these medications        acetaminophen 650 MG suppository  Commonly known as:  TYLENOL  Place 1 suppository (650 mg  total) rectally every 6 (six) hours.     bisacodyl 10 MG suppository  Commonly known as:  DULCOLAX  Place 1 suppository (10 mg total) rectally daily as needed for moderate constipation.     clotrimazole 1 % cream  Commonly known as:  LOTRIMIN  Apply topically 2 (two) times daily.     LORazepam 1 MG tablet  Commonly known as:  ATIVAN  Take 1 tablet (1 mg total) by mouth every 2 (two) hours as needed for anxiety.     morphine CONCENTRATE 10 mg / 0.5 ml concentrated solution  Take 0.5 mLs (10 mg total) by mouth every 4 (four) hours as needed for severe pain.     ondansetron 4 MG disintegrating tablet  Commonly known as:  ZOFRAN-ODT  Take 4 mg by mouth every 8 (eight) hours as needed for nausea or vomiting.       Allergies  Allergen Reactions  . Statins Other (See Comments)    MUSCLE ACHES      The results of significant diagnostics from this hospitalization (including imaging, microbiology, ancillary and laboratory) are listed below for reference.    Significant Diagnostic Studies: US Abdomen Complete  02/18/2015  CLINICAL DATA:  Generalized abdominal pain and nausea for 3 days. History of right-sided nephrectomy for renal cell carcinoma. History of prostate cancer. EXAM: ULTRASOUND ABDOMEN COMPLETE COMPARISON:  CT abdomen pelvis - 09/18/2014 FINDINGS: Gallbladder: Sonographically normal. No echogenic gallstones or gall sludge. No gallbladder wall thickening or pericholecystic fluid. Negative sonographic Murphy's sign. Common bile duct: Diameter: Normal in size measuring 6.5 mm in diameter. Liver: Homogeneous hepatic echotexture. No discrete hepatic lesions. No definite evidence of intrahepatic biliary ductal dilatation. Interval development of a small to moderate amount of intra-abdominal ascites. The portal vein appears patent were imaged (image 41). IVC: No abnormality visualized. Pancreas: Limited visualization of the pancreatic head and neck is normal. Visualization of the  pancreatic body and tail is obscured by bowel gas. Spleen: Normal in size measuring 4.9 cm in length Right Kidney:  Surgically absent Left Kidney: Normal cortical thickness, echogenicity and size, measuring 11.5 cm in length. No focal renal lesions. No echogenic renal stones. No urinary obstruction. Abdominal aorta: No aneurysm visualized. Other findings: None. IMPRESSION: Interval development of a small to moderate amount of intra-abdominal ascites, the etiology of which is not depicted on this examination. The portal vein appears patent where imaged. No evidence splenomegaly. Electronically Signed   By: Sandi Mariscal M.D.   On: 02/18/2015 15:28   Dg Esophagus  02/02/2015  CLINICAL DATA:  Dysphagia. EXAM: ESOPHOGRAM / BARIUM SWALLOW / BARIUM TABLET STUDY TECHNIQUE: Combined double contrast and single contrast examination performed using effervescent crystals, thick barium liquid, and thin barium liquid. The patient was observed with fluoroscopy swallowing a 13 mm barium sulphate tablet. FLUOROSCOPY TIME:  Fluoroscopy Time:  4 minutes  6 seconds. Number of Acquired Images:  38. COMPARISON:  None. FINDINGS: Tertiary contractions are seen throughout the middle and distal portion of the esophagus. There appears to be a small diverticulum in the distal esophagus. This is in an area of persistent narrowing. Reportedly, the patient has had previous endoscopies and esophageal dilatations. Barium tablet was administered and took approximately 2 minutes to pass through the esophagus into the stomach. No definite reflux was noted. No definite hiatal hernia is noted. IMPRESSION: Extensive tertiary contractions are seen involving the middle and distal esophagus consistent with presbyesophagus. Area persistent narrowing is seen in the distal esophagus with probable associated diverticulum. The patient reportedly has undergone multiple endoscopies and esophageal dilatations. It took approximately 2 minutes for barium tablet to  pass through esophagus into stomach. Electronically Signed   By: Marijo Conception, M.D.   On: 02/02/2015 09:13   Dg Chest Port 1 View  02/20/2015  CLINICAL DATA:  PICC placement.  Initial encounter. EXAM: PORTABLE CHEST 1 VIEW COMPARISON:  Chest radiograph performed 01/06/2015 FINDINGS: A right PICC is noted ending about the mid SVC. Small bilateral pleural effusions are noted. Vascular congestion is seen, with increased interstitial markings, concerning for mild pulmonary edema. No pneumothorax is identified. The cardiomediastinal silhouette is enlarged. The patient is status post median sternotomy. No acute osseous abnormalities are seen. IMPRESSION: 1. Right PICC noted ending about the mid SVC. 2. Small bilateral pleural effusions noted. Vascular congestion and cardiomegaly, with increased interstitial markings, concerning for mild pulmonary edema. Electronically Signed   By: Garald Balding M.D.   On: 02/20/2015 20:19    Microbiology: Recent Results (from the past 240 hour(s))  Surgical pcr screen     Status: None   Collection Time: 02/24/15  9:35 PM  Result Value Ref Range Status   MRSA, PCR NEGATIVE NEGATIVE Final   Staphylococcus aureus NEGATIVE NEGATIVE Final    Comment:        The Xpert SA Assay (FDA approved for NASAL specimens in patients over 69 years of age), is one component of a comprehensive surveillance program.  Test performance has been validated by [facility] for patients greater than or equal to 36 year old. It is not intended to diagnose infection nor to guide or monitor treatment.      Labs: Basic Metabolic Panel:  Recent Labs Lab 02/25/15 0911 02/26/15 0629 02/27/15 0640 03/01/15 0622  NA 148* 149* 144 138  K 3.6 3.5 3.4* 3.5  CL 112* 114* 111 105  CO2 29 30 28 26   GLUCOSE 100* 87 93 89  BUN 18 20 19 19   CREATININE 1.51* 1.67* 1.57* 1.61*  CALCIUM 8.9 8.8* 8.5* 8.8*   Liver Function Tests: No results for input(s): AST, ALT, ALKPHOS, BILITOT,  PROT, ALBUMIN in the last 168 hours. No results for input(s): LIPASE, AMYLASE in the last 168 hours. No results for input(s): AMMONIA in the last 168 hours. CBC: No results for input(s): WBC, NEUTROABS, HGB, HCT, MCV, PLT in the last 168 hours. Cardiac Enzymes: No results for input(s): CKTOTAL, CKMB, CKMBINDEX, TROPONINI in the last 168 hours. BNP: BNP (last 3 results)  Recent Labs  09/18/14 0130 01/04/15 2140 02/18/15 1245  BNP 2240.0* 2464.0* 3201.1*    ProBNP (last 3 results) No results for input(s): PROBNP in the last 8760 hours.  CBG:  Recent Labs Lab 03/01/15 0750 03/01/15 1129 03/01/15 2054 03/02/15 0738 03/02/15 1132  GLUCAP 94 85 85 65 75       Signed:  Lelon Frohlich  [facility] Pager: [phone] 03/02/2015, 2:00 PM

## 2015-03-02 NOTE — Care Management Note (Signed)
Case Management Note  Patient Details  Name: [name] [name] MRN: [id number] Date of Birth: 1933/08/20  Pt is discharging home today with hospice services through [facility] (per families choice). Velna Hatchet, of [employer], faxed referral info. Per family, pt will ned hospital bed. [facility] has made DME referral to [place]. Pt will be transported home via EMS after hospital bed has been delivered. Pt's DIL michelle will notify staff when family is ready for transport. No further CM needs.   Expected Discharge Date:  02/22/15               Expected Discharge Plan:  Home w Hospice Care  In-House Referral:  Hospice / Palliative Care  Discharge planning Services  CM Consult  Post Acute Care Choice:  Hospice Choice offered to:  Adult Children  DME Arranged:  Hospital bed DME Agency:  [facility]  HH Arranged:  RN [facility] Agency:  [place], Other - See comment  Status of Service:  Completed, signed off  Medicare Important Message Given:  Yes Date Medicare IM Given:    Medicare IM give by:    Date Additional Medicare IM Given:    Additional Medicare Important Message give by:     If discussed at [place] of Stay Meetings, dates discussed:  03/02/2015  Additional Comments:  Sherald Barge, RN 03/02/2015, 11:30 AM

## 2015-03-02 NOTE — Progress Notes (Signed)
Pt off unit escorted by EMS and son, disposition home. Foley to be left intact per pervious nurse L. Bullins.

## 2015-03-02 NOTE — Care Management Important Message (Signed)
Important Message  Patient Details  Name: [name] [name] MRN: [id number] Date of Birth: [birth year]   Medicare Important Message Given:  Yes    Sherald Barge, RN 03/02/2015, 11:34 AM

## 2015-03-02 NOTE — Progress Notes (Signed)
Patient with orders to be discharge home. Discharge instructions reviewed, daughter verbalized understanding. Prescriptions given. Patient stable. Patient awaiting EMS transportation.

## 2015-03-03 ENCOUNTER — Other Ambulatory Visit: Payer: Self-pay | Admitting: *Deleted

## 2015-03-03 NOTE — Patient Outreach (Signed)
[facility]) Care Management  03/03/2015  [name] [name] 1933/05/18 [id number]  Outreach attempt for transition of care unsuccessful today. I was unable to reach [name] [name] (dtr-in-law; primary caregiver) by phone.   I will attempt telephonic outreach again on Friday 03/05/15.    [facility] [facility]  [phone]

## 2015-03-05 ENCOUNTER — Encounter: Payer: Self-pay | Admitting: *Deleted

## 2015-03-05 ENCOUNTER — Other Ambulatory Visit: Payer: Self-pay | Admitting: *Deleted

## 2015-03-05 NOTE — Patient Outreach (Signed)
[facility]) Care Management  03/05/2015  JOJI KIRCHGESSNER Dec 21, 1933 [id number]  I contacted Mr. Welby's daughter in Set designer at home by phone. She confirms that Mr. Gebhardt is at home and Hospice services are in place. I notified Mrs. Goheen that I would discharge Mr. Boehne from [facility] services as Hospice will take over case management needs. I also notified Mrs. Baglio that [facility] would happily assist with any needs if they arise and assistance is needed in collaboration with Hospice Care services.   It has been our pleasure to assist in the care of this gentleman.    [facility]  [phone]

## 2015-03-11 DEATH — deceased

## 2015-03-30 ENCOUNTER — Ambulatory Visit: Payer: Self-pay | Admitting: Cardiovascular Disease

## 2015-11-13 IMAGING — XA IR IVC FILTER PLMT / S&I /IMG GUID/MOD SED
3 series · 9 of 9 positions shown · IV contrast (CARBON DIOXIDE)
Comparison: none

CLINICAL DATA: DVT, hematuria. Needs to go up anticoagulation for
urologic procedure. Caval filtration requested. Renal insufficiency.

EXAM:
INFERIOR VENACAVOGRAM
IVC FILTER PLACEMENT UNDER FLUOROSCOPY
FLUOROSCOPY TIME:  24 seconds, 149 mGy
TECHNIQUE: The procedure, risks (including but not limited to bleeding,
infection, organ damage ), benefits, and alternatives were explained
to the patient. Questions regarding the procedure were encouraged
and answered. The patient understands and consents to the procedure.
Patency of the right IJ vein was confirmed with ultrasound with
image documentation. An appropriate skin site was determined. Skin
site was marked, prepped with chlorhexidine, and draped using
maximum barrier technique. The region was infiltrated locally with
1% lidocaine.

[Series 2: co 2 · 4 of 24 frames shown (1 of 2)]
[frame 2/24]
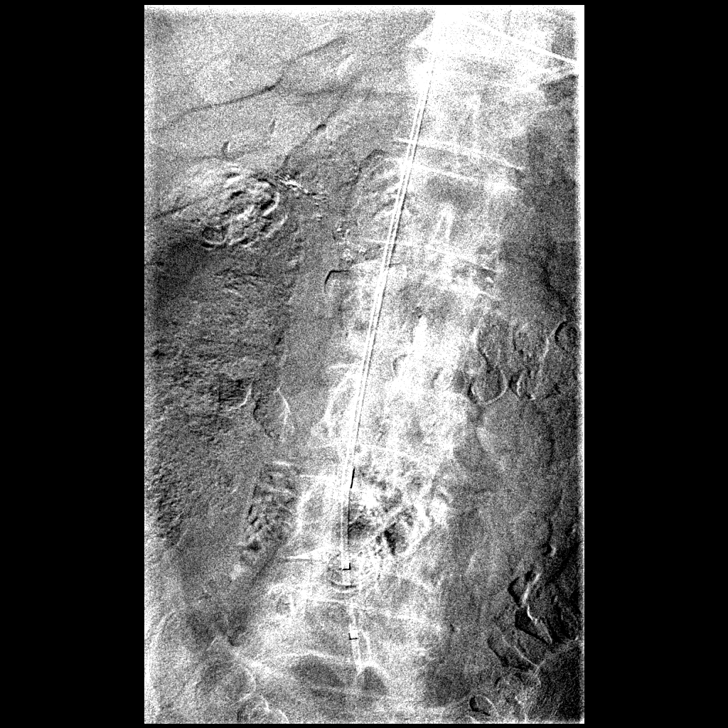
[frame 4/24]
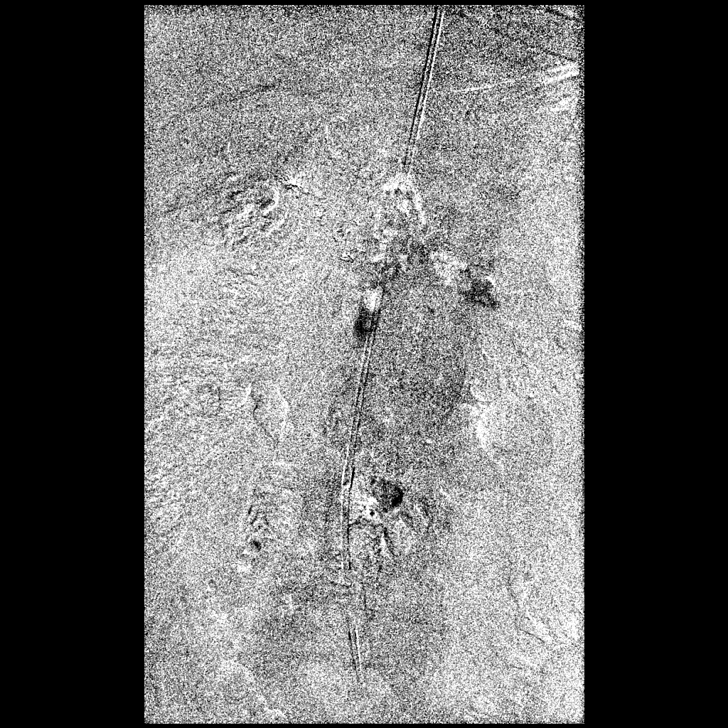
[frame 13/24]
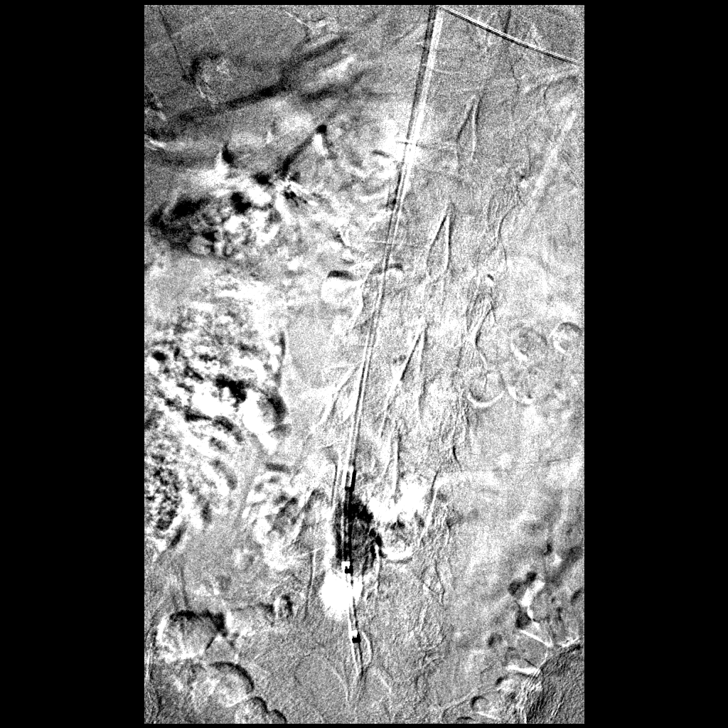
[frame 21/24]
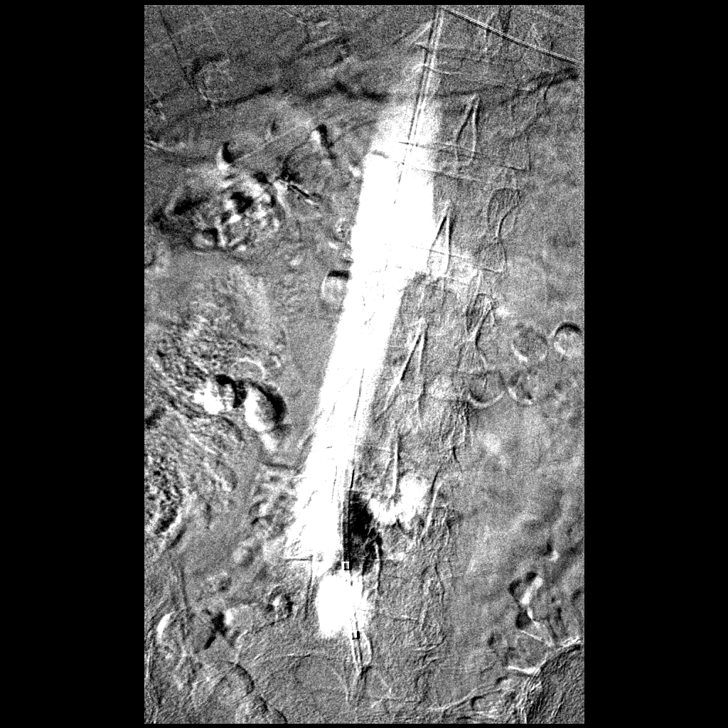

[Series 3: co 2 · 4 of 31 frames shown (2 of 2)]
[frame 5/31]
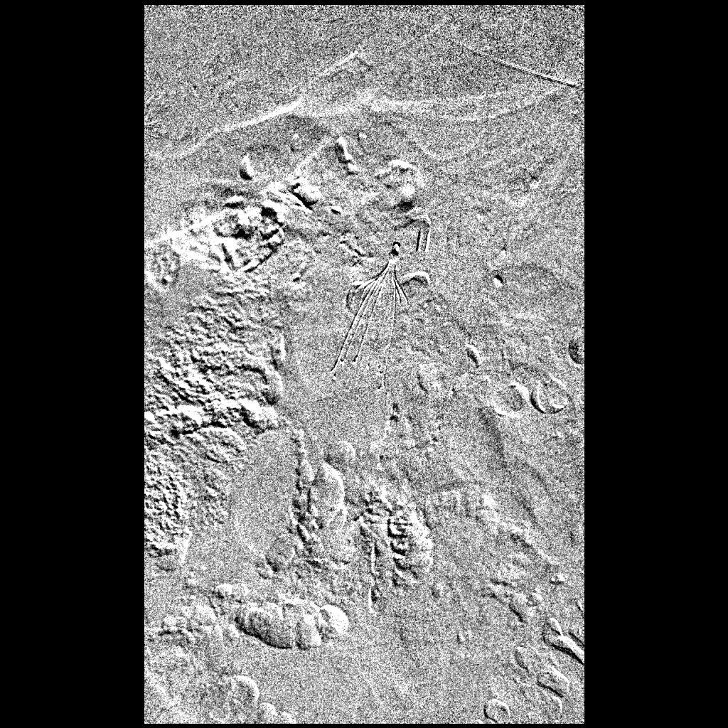
[frame 8/31]
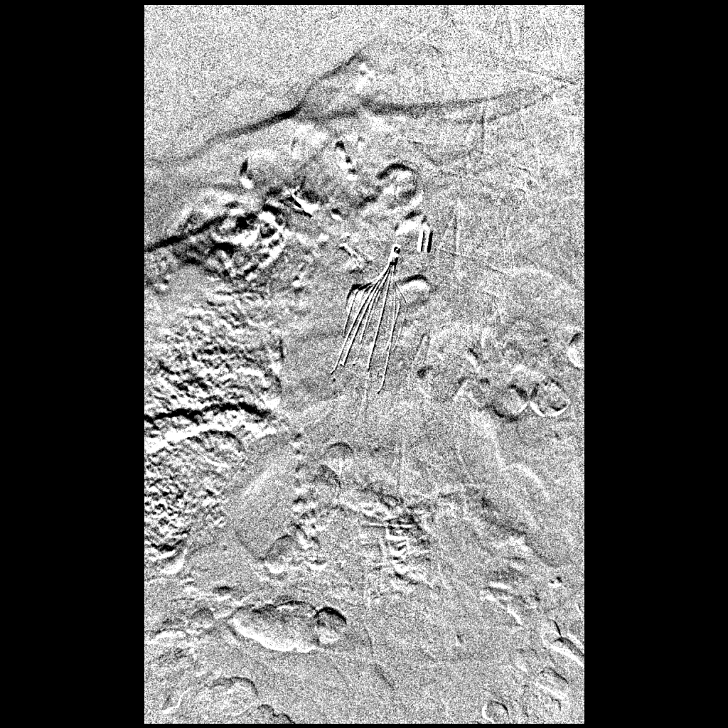
[frame 16/31]
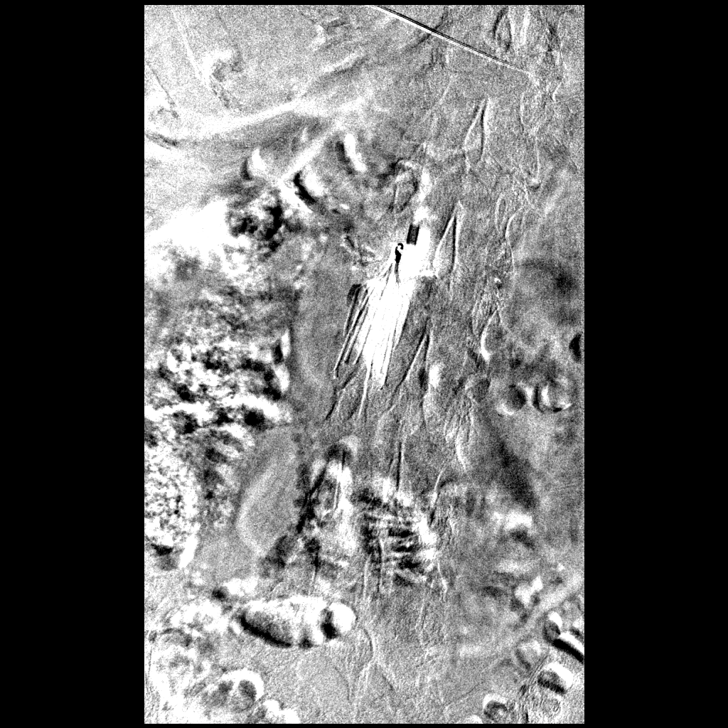
[frame 27/31]
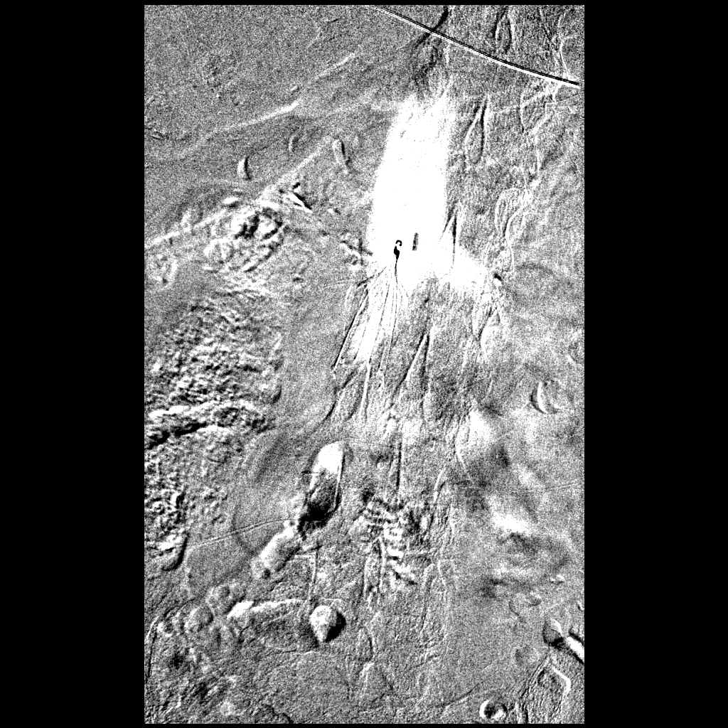

[Series 300: ir ivc filter plmt / s &i /img guid/mod  · 1 of 1 slices shown]
[im 1/1]
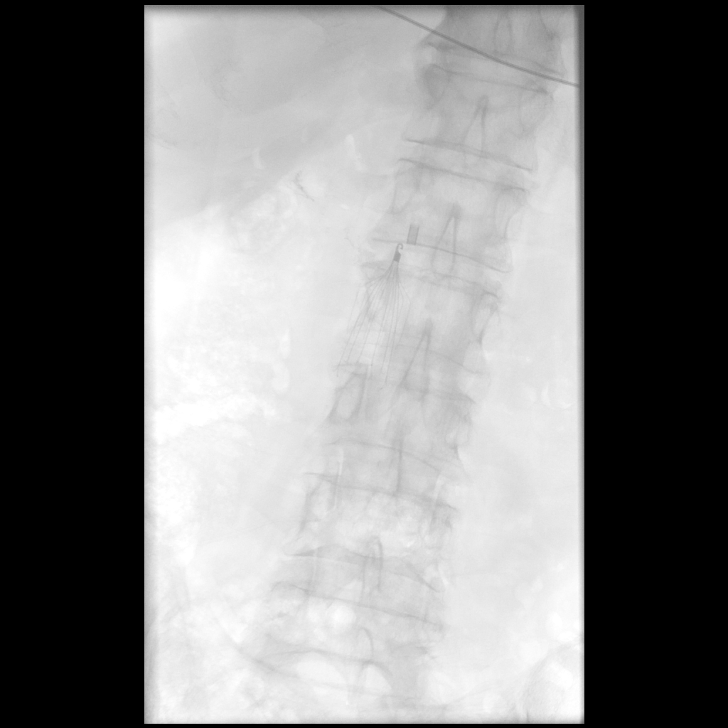

[9 of 9 positions shown; findings below may reference images not displayed]

Intravenous Fentanyl and Versed were administered as conscious
sedation during continuous cardiorespiratory monitoring by the
radiology RN, with a total moderate sedation time of 6 minutes.

Under real-time ultrasound guidance, the right IJ vein was accessed
with a 21 gauge micropuncture needle; the needle tip within the vein
was confirmed with ultrasound image documentation. The needle was
exchanged over a 018 guidewire for a transitional dilator, which
allow advancement of the Benson wire into the IVC. A long 6 French
vascular sheath was placed for inferior CO2 venacavography. This
demonstrated no caval thrombus. Renal vein inflows were evident.

The Denali IVC filter was advanced through the sheath and
successfully deployed under fluoroscopy at the L2 infrarenal level.
Followup CO2 cavagram demonstrates stable filter position and no
evident complication. The sheath was removed and hemostasis achieved
at the site. No immediate complication.
IMPRESSION: 1. Normal IVC. No thrombus or significant anatomic variation.
2. Technically successful infrarenal IVC filter placement. This is a
retrievable model.

## 2016-03-01 IMAGING — CR DG CHEST 1V PORT
1 series · 1 of 1 positions shown · non-contrast
Comparison: 09/18/2014

CLINICAL DATA: Shortness of breath all day.

EXAM:
PORTABLE CHEST 1 VIEW

[ap portable]
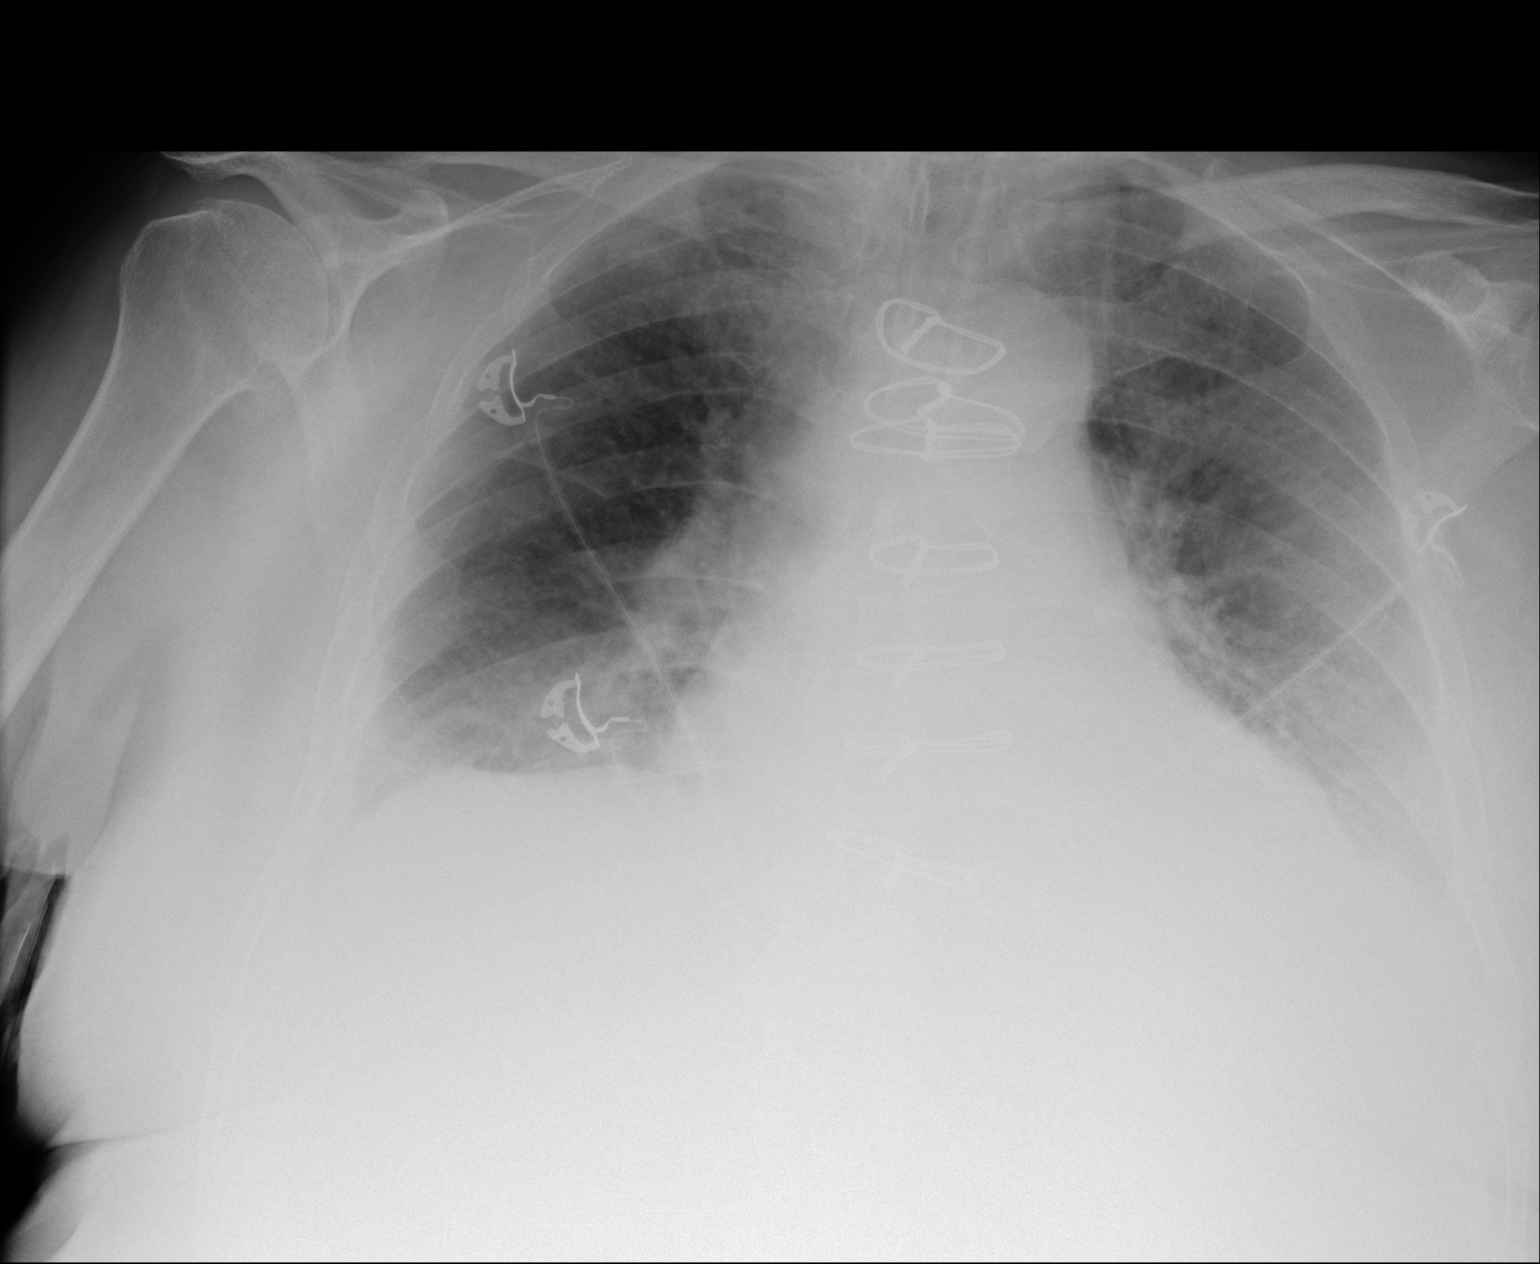

[1 of 1 positions shown; findings below may reference images not displayed]

FINDINGS: Patient is post median sternotomy. Lung volumes are low. Hazy
opacity at the lung bases, likely pleural effusions. Perivascular
haziness concerning for pulmonary edema. No pneumothorax or
confluent airspace disease. Cardiomediastinal contours are unchanged
allowing for differences in technique.
IMPRESSION: Hypoventilatory chest with bilateral pleural effusions and
perivascular haziness, concerning for pulmonary edema. Findings
suggest mild CHF.

## 2016-05-12 ENCOUNTER — Encounter: Payer: Self-pay | Admitting: Internal Medicine

## 2016-05-16 ENCOUNTER — Encounter: Payer: Self-pay | Admitting: Internal Medicine
